# Patient Record
Sex: Male | Born: 1940 | Race: White | Hispanic: No | Marital: Married | Smoking: Former smoker
Health system: Southern US, Community
[De-identification: ages and names within clinical notes are randomized; demographics above are authoritative.]

## PROBLEM LIST (undated history)

## (undated) DIAGNOSIS — H3320 Serous retinal detachment, unspecified eye: Secondary | ICD-10-CM

## (undated) DIAGNOSIS — K219 Gastro-esophageal reflux disease without esophagitis: Secondary | ICD-10-CM

## (undated) DIAGNOSIS — C649 Malignant neoplasm of unspecified kidney, except renal pelvis: Secondary | ICD-10-CM

## (undated) DIAGNOSIS — J449 Chronic obstructive pulmonary disease, unspecified: Secondary | ICD-10-CM

## (undated) DIAGNOSIS — Z87442 Personal history of urinary calculi: Secondary | ICD-10-CM

## (undated) DIAGNOSIS — R52 Pain, unspecified: Secondary | ICD-10-CM

## (undated) DIAGNOSIS — K635 Polyp of colon: Secondary | ICD-10-CM

## (undated) DIAGNOSIS — N19 Unspecified kidney failure: Secondary | ICD-10-CM

## (undated) DIAGNOSIS — M6250 Muscle wasting and atrophy, not elsewhere classified, unspecified site: Secondary | ICD-10-CM

## (undated) DIAGNOSIS — K5900 Constipation, unspecified: Secondary | ICD-10-CM

## (undated) DIAGNOSIS — D649 Anemia, unspecified: Secondary | ICD-10-CM

## (undated) DIAGNOSIS — M1990 Unspecified osteoarthritis, unspecified site: Secondary | ICD-10-CM

## (undated) DIAGNOSIS — N200 Calculus of kidney: Secondary | ICD-10-CM

## (undated) HISTORY — PX: EYE SURGERY: SHX253

## (undated) HISTORY — PX: HAND SURGERY: SHX662

## (undated) HISTORY — PX: BACK SURGERY: SHX140

## (undated) HISTORY — PX: LAMINECTOMY: SHX219

## (undated) HISTORY — DX: Calculus of kidney: N20.0

## (undated) HISTORY — PX: NEPHRECTOMY: SHX65

## (undated) HISTORY — DX: Unspecified kidney failure: N19

## (undated) HISTORY — DX: Gastro-esophageal reflux disease without esophagitis: K21.9

## (undated) HISTORY — DX: Polyp of colon: K63.5

## (undated) HISTORY — DX: Muscle wasting and atrophy, not elsewhere classified, unspecified site: M62.50

## (undated) HISTORY — PX: KNEE SURGERY: SHX244

## (undated) HISTORY — DX: Chronic obstructive pulmonary disease, unspecified: J44.9

## (undated) HISTORY — DX: Malignant neoplasm of unspecified kidney, except renal pelvis: C64.9

---

## 1999-08-25 ENCOUNTER — Ambulatory Visit (HOSPITAL_COMMUNITY): Admission: RE | Admit: 1999-08-25 | Discharge: 1999-08-25 | Payer: Self-pay | Admitting: Gastroenterology

## 2000-10-05 ENCOUNTER — Ambulatory Visit (HOSPITAL_COMMUNITY): Admission: RE | Admit: 2000-10-05 | Discharge: 2000-10-05 | Payer: Self-pay | Admitting: *Deleted

## 2000-10-05 ENCOUNTER — Encounter: Payer: Self-pay | Admitting: *Deleted

## 2001-06-08 HISTORY — PX: CATARACT EXTRACTION: SUR2

## 2001-11-22 ENCOUNTER — Encounter: Payer: Self-pay | Admitting: Urology

## 2001-11-22 ENCOUNTER — Encounter: Admission: RE | Admit: 2001-11-22 | Discharge: 2001-11-22 | Payer: Self-pay | Admitting: Urology

## 2002-12-18 ENCOUNTER — Ambulatory Visit (HOSPITAL_COMMUNITY): Admission: RE | Admit: 2002-12-18 | Discharge: 2002-12-18 | Payer: Self-pay | Admitting: Urology

## 2002-12-18 ENCOUNTER — Encounter: Payer: Self-pay | Admitting: Urology

## 2003-10-16 ENCOUNTER — Emergency Department (HOSPITAL_COMMUNITY): Admission: EM | Admit: 2003-10-16 | Discharge: 2003-10-16 | Payer: Self-pay | Admitting: Emergency Medicine

## 2003-10-18 ENCOUNTER — Encounter: Payer: Self-pay | Admitting: Orthopedic Surgery

## 2004-10-27 ENCOUNTER — Ambulatory Visit: Payer: Self-pay | Admitting: Orthopedic Surgery

## 2004-11-10 ENCOUNTER — Ambulatory Visit: Payer: Self-pay | Admitting: Orthopedic Surgery

## 2004-11-12 ENCOUNTER — Ambulatory Visit (HOSPITAL_COMMUNITY): Admission: RE | Admit: 2004-11-12 | Discharge: 2004-11-12 | Payer: Self-pay | Admitting: Orthopedic Surgery

## 2004-11-19 ENCOUNTER — Ambulatory Visit: Payer: Self-pay | Admitting: Orthopedic Surgery

## 2004-12-22 ENCOUNTER — Encounter
Admission: RE | Admit: 2004-12-22 | Discharge: 2004-12-22 | Payer: Self-pay | Admitting: Physical Medicine and Rehabilitation

## 2005-01-05 ENCOUNTER — Encounter (INDEPENDENT_AMBULATORY_CARE_PROVIDER_SITE_OTHER): Payer: Self-pay | Admitting: *Deleted

## 2005-01-05 ENCOUNTER — Inpatient Hospital Stay (HOSPITAL_COMMUNITY): Admission: RE | Admit: 2005-01-05 | Discharge: 2005-01-08 | Payer: Self-pay | Admitting: Orthopedic Surgery

## 2005-03-02 ENCOUNTER — Ambulatory Visit (HOSPITAL_COMMUNITY): Admission: RE | Admit: 2005-03-02 | Discharge: 2005-03-02 | Payer: Self-pay | Admitting: Urology

## 2006-03-15 ENCOUNTER — Inpatient Hospital Stay (HOSPITAL_COMMUNITY): Admission: RE | Admit: 2006-03-15 | Discharge: 2006-03-22 | Payer: Self-pay | Admitting: Orthopedic Surgery

## 2006-04-07 ENCOUNTER — Emergency Department (HOSPITAL_COMMUNITY): Admission: EM | Admit: 2006-04-07 | Discharge: 2006-04-07 | Payer: Self-pay | Admitting: Emergency Medicine

## 2006-05-11 ENCOUNTER — Ambulatory Visit (HOSPITAL_COMMUNITY): Admission: RE | Admit: 2006-05-11 | Discharge: 2006-05-11 | Payer: Self-pay | Admitting: Urology

## 2007-02-08 ENCOUNTER — Encounter: Payer: Self-pay | Admitting: Critical Care Medicine

## 2007-02-08 ENCOUNTER — Ambulatory Visit (HOSPITAL_COMMUNITY): Admission: RE | Admit: 2007-02-08 | Discharge: 2007-02-08 | Payer: Self-pay | Admitting: Family Medicine

## 2007-12-05 DIAGNOSIS — Z860100 Personal history of colon polyps, unspecified: Secondary | ICD-10-CM | POA: Insufficient documentation

## 2007-12-05 DIAGNOSIS — Z87442 Personal history of urinary calculi: Secondary | ICD-10-CM | POA: Insufficient documentation

## 2007-12-06 ENCOUNTER — Ambulatory Visit: Payer: Self-pay | Admitting: Critical Care Medicine

## 2007-12-06 DIAGNOSIS — J449 Chronic obstructive pulmonary disease, unspecified: Secondary | ICD-10-CM | POA: Insufficient documentation

## 2007-12-06 DIAGNOSIS — J4489 Other specified chronic obstructive pulmonary disease: Secondary | ICD-10-CM | POA: Insufficient documentation

## 2007-12-06 DIAGNOSIS — C649 Malignant neoplasm of unspecified kidney, except renal pelvis: Secondary | ICD-10-CM | POA: Insufficient documentation

## 2007-12-07 ENCOUNTER — Telehealth (INDEPENDENT_AMBULATORY_CARE_PROVIDER_SITE_OTHER): Payer: Self-pay | Admitting: *Deleted

## 2007-12-19 ENCOUNTER — Telehealth: Payer: Self-pay | Admitting: Critical Care Medicine

## 2008-01-30 ENCOUNTER — Ambulatory Visit: Payer: Self-pay | Admitting: Critical Care Medicine

## 2008-03-30 ENCOUNTER — Ambulatory Visit: Payer: Self-pay | Admitting: Critical Care Medicine

## 2008-07-16 ENCOUNTER — Ambulatory Visit (HOSPITAL_COMMUNITY): Admission: RE | Admit: 2008-07-16 | Discharge: 2008-07-16 | Payer: Self-pay | Admitting: Urology

## 2008-10-30 ENCOUNTER — Emergency Department (HOSPITAL_COMMUNITY): Admission: EM | Admit: 2008-10-30 | Discharge: 2008-10-31 | Payer: Self-pay | Admitting: Emergency Medicine

## 2008-10-30 ENCOUNTER — Ambulatory Visit (HOSPITAL_BASED_OUTPATIENT_CLINIC_OR_DEPARTMENT_OTHER): Admission: RE | Admit: 2008-10-30 | Discharge: 2008-10-30 | Payer: Self-pay | Admitting: Orthopedic Surgery

## 2008-10-31 ENCOUNTER — Encounter (INDEPENDENT_AMBULATORY_CARE_PROVIDER_SITE_OTHER): Payer: Self-pay | Admitting: Emergency Medicine

## 2008-10-31 ENCOUNTER — Ambulatory Visit (HOSPITAL_COMMUNITY): Admission: RE | Admit: 2008-10-31 | Discharge: 2008-10-31 | Payer: Self-pay | Admitting: Emergency Medicine

## 2008-10-31 ENCOUNTER — Ambulatory Visit: Payer: Self-pay | Admitting: Vascular Surgery

## 2008-12-14 ENCOUNTER — Encounter: Payer: Self-pay | Admitting: Critical Care Medicine

## 2008-12-19 ENCOUNTER — Ambulatory Visit: Payer: Self-pay | Admitting: Internal Medicine

## 2009-01-21 ENCOUNTER — Ambulatory Visit: Payer: Self-pay | Admitting: Critical Care Medicine

## 2009-02-04 ENCOUNTER — Encounter: Payer: Self-pay | Admitting: Critical Care Medicine

## 2009-02-04 ENCOUNTER — Ambulatory Visit: Payer: Self-pay | Admitting: Critical Care Medicine

## 2009-02-13 ENCOUNTER — Telehealth (INDEPENDENT_AMBULATORY_CARE_PROVIDER_SITE_OTHER): Payer: Self-pay | Admitting: *Deleted

## 2009-05-29 ENCOUNTER — Ambulatory Visit: Payer: Self-pay | Admitting: Critical Care Medicine

## 2009-07-31 ENCOUNTER — Ambulatory Visit: Payer: Self-pay | Admitting: Orthopedic Surgery

## 2009-07-31 DIAGNOSIS — IMO0002 Reserved for concepts with insufficient information to code with codable children: Secondary | ICD-10-CM | POA: Insufficient documentation

## 2009-08-12 ENCOUNTER — Ambulatory Visit: Payer: Self-pay | Admitting: Orthopedic Surgery

## 2009-08-14 ENCOUNTER — Encounter: Payer: Self-pay | Admitting: Critical Care Medicine

## 2009-08-20 ENCOUNTER — Telehealth: Payer: Self-pay | Admitting: Critical Care Medicine

## 2009-08-21 ENCOUNTER — Ambulatory Visit: Payer: Self-pay | Admitting: Critical Care Medicine

## 2009-08-21 DIAGNOSIS — R599 Enlarged lymph nodes, unspecified: Secondary | ICD-10-CM | POA: Insufficient documentation

## 2009-08-23 ENCOUNTER — Ambulatory Visit (HOSPITAL_COMMUNITY): Admission: RE | Admit: 2009-08-23 | Discharge: 2009-08-23 | Payer: Self-pay | Admitting: Critical Care Medicine

## 2009-08-26 ENCOUNTER — Telehealth (INDEPENDENT_AMBULATORY_CARE_PROVIDER_SITE_OTHER): Payer: Self-pay | Admitting: *Deleted

## 2009-08-26 ENCOUNTER — Ambulatory Visit: Payer: Self-pay | Admitting: Orthopedic Surgery

## 2009-08-30 ENCOUNTER — Ambulatory Visit: Payer: Self-pay | Admitting: Emergency Medicine

## 2009-08-30 ENCOUNTER — Ambulatory Visit: Payer: Self-pay | Admitting: Thoracic Surgery

## 2009-08-30 ENCOUNTER — Ambulatory Visit (HOSPITAL_COMMUNITY): Admission: RE | Admit: 2009-08-30 | Discharge: 2009-08-30 | Payer: Self-pay | Admitting: Thoracic Surgery

## 2009-09-02 ENCOUNTER — Telehealth: Payer: Self-pay | Admitting: Critical Care Medicine

## 2009-09-03 ENCOUNTER — Telehealth: Payer: Self-pay | Admitting: Critical Care Medicine

## 2009-09-10 ENCOUNTER — Ambulatory Visit: Payer: Self-pay | Admitting: Critical Care Medicine

## 2009-10-21 ENCOUNTER — Ambulatory Visit: Payer: Self-pay | Admitting: Internal Medicine

## 2009-10-25 ENCOUNTER — Ambulatory Visit (HOSPITAL_BASED_OUTPATIENT_CLINIC_OR_DEPARTMENT_OTHER): Admission: RE | Admit: 2009-10-25 | Discharge: 2009-10-25 | Payer: Self-pay | Admitting: Urology

## 2010-06-17 ENCOUNTER — Encounter
Admission: RE | Admit: 2010-06-17 | Discharge: 2010-06-17 | Payer: Self-pay | Source: Home / Self Care | Attending: Cardiovascular Disease | Admitting: Cardiovascular Disease

## 2010-06-23 ENCOUNTER — Ambulatory Visit (HOSPITAL_COMMUNITY)
Admission: RE | Admit: 2010-06-23 | Discharge: 2010-06-23 | Payer: Self-pay | Source: Home / Self Care | Attending: Cardiovascular Disease | Admitting: Cardiovascular Disease

## 2010-06-25 LAB — POCT I-STAT 3, ART BLOOD GAS (G3+)
Acid-base deficit: 2 mmol/L (ref 0.0–2.0)
Bicarbonate: 20.6 mEq/L (ref 20.0–24.0)
O2 Saturation: 93 %
TCO2: 22 mmol/L (ref 0–100)
pCO2 arterial: 29.8 mmHg — ABNORMAL LOW (ref 35.0–45.0)
pH, Arterial: 7.448 (ref 7.350–7.450)
pO2, Arterial: 64 mmHg — ABNORMAL LOW (ref 80.0–100.0)

## 2010-06-25 LAB — POCT I-STAT 3, VENOUS BLOOD GAS (G3P V)
Acid-base deficit: 3 mmol/L — ABNORMAL HIGH (ref 0.0–2.0)
Acid-base deficit: 6 mmol/L — ABNORMAL HIGH (ref 0.0–2.0)
Bicarbonate: 19 mEq/L — ABNORMAL LOW (ref 20.0–24.0)
Bicarbonate: 21.6 mEq/L (ref 20.0–24.0)
O2 Saturation: 59 %
O2 Saturation: 62 %
TCO2: 20 mmol/L (ref 0–100)
TCO2: 23 mmol/L (ref 0–100)
pCO2, Ven: 34.5 mmHg — ABNORMAL LOW (ref 45.0–50.0)
pCO2, Ven: 34.8 mmHg — ABNORMAL LOW (ref 45.0–50.0)
pH, Ven: 7.345 — ABNORMAL HIGH (ref 7.250–7.300)
pH, Ven: 7.405 — ABNORMAL HIGH (ref 7.250–7.300)
pO2, Ven: 30 mmHg (ref 30.0–45.0)
pO2, Ven: 34 mmHg (ref 30.0–45.0)

## 2010-07-03 NOTE — Procedures (Addendum)
NAMEAMANI, NODARSE                ACCOUNT NO.:  [id number]  MEDICAL RECORD NO.:  [id number]          PATIENT TYPE:  OIB  LOCATION:  2899                         FACILITY:  [facility]  PHYSICIAN:  Thurmon Fair, MD     DATE OF BIRTH:  May 19, 1941  DATE OF PROCEDURE:  06/23/2010 DATE OF DISCHARGE:  06/23/2010                           CARDIAC CATHETERIZATION   Mr. Popoff is a 70 year old gentleman with chest discomfort.  A recent myocardial perfusion study suggested the presence of the inferior wall infarction documented by the absence of perfusion and severe hypokinesis.  He also has a longstanding history of smoking, albeit quitting 20 years ago.  PROCEDURES PERFORMED: 1. Right and left heart catheterization. 2. Selective coronary angiography. 3. Left ventriculography. 4. Calculation of cardiac output using the Fick equation.  SURGEON:  Thurmon Fair, MD  After risks and benefits of the procedure were described, the patient provided informed consent and was brought to the Cardiac Cath Lab in the fasting state.  He was prepped and draped in the usual sterile fashion. Local anesthesia with 1% lidocaine was administered into the right groin area.  A 5-French right common femoral artery sheath and a 7-French right common femoral vein sheath were introduced via the modified Seldinger technique without difficulty.  Under fluoroscopic guidance, a balloon-tip Swan-Ganz catheter was advanced to the level of the main pulmonary artery.  Simultaneous blood samples for oxygen saturation were obtained from the pulmonary artery and the femoral artery.  This catheter was then used to record pressures in the pulmonary artery wedge position, main pulmonary right ventricle, and right atrium and a final sample of blood from the right atrium was also sent for oximetry.  The Swan-Ganz catheter was then removed.  Under fluoroscopic guidance, using 5-French JL-4, JR, and angled pigtail catheters  with selective cannulation and multiple angiograms of the right and left coronary arteries were performed and the left ventriculogram in the RAO projection was also performed.  At the end of procedure, all catheters were withdrawn and no immediate complications were occurred.  FINDINGS:  Intracardiac pressures were recorded as follows:  Pulmonary artery 32/11 (mean 20) mmHg.  Pulmonary wedge pressure:  A-wave 10, V-wave 7 (mean 6 mmHg).  Right atrial pressure:  A-wave 10, V-wave 6 (mean 5) mmHg.  Right ventricle 29/4 with end-diastolic of 8 mmHg.  Left ventricle 141/0 with an end-diastolic of 16 mmHg.  Aorta 144 71 (mean 97) mmHg.  The calculated cardiac output using the Fick equation was 3.84 liters per minute corresponding to cardiac index of 2.45 liters per minute per meter squared body surface area.  The patient has a right coronary dominant system.  The right coronary artery generates a PDA and PLV.  The left coronary bifurcates in the usual fashion into the left anterior descending artery and left circumflex coronary artery.  The LAD generates 2 important diagonal branches.  The left circumflex generates 2 oblique marginal vessels of which the first is much larger and branches.  There is no evidence of any coronary stenosis in any of these vessels. In fact, there is no significant atherosclerosis noted in any of  these arteries.  Left ventricle is normal sized with regional wall motion and overall systolic function with ejection fraction of 60%.  There is no evidence of hypokinesis of any of the walls, specifically the inferior wall appears normal.  CONCLUSION:  Mr. Mancha has no evidence of significant coronary artery disease.  There is no evidence of previous inferior wall myocardial infarction as had been suggested by his nuclear stress test. Intracardiac pressures are normal.  The nuclear stress test is therefore consistent with a "false  positive" study.     Thurmon Fair, MD     MC/MEDQ  D:  06/23/2010  T:  06/24/2010  Job:  [id number]  Electronically Signed by Thurmon Fair M.D. on 07/03/2010 12:18:18 PM

## 2010-07-10 NOTE — Progress Notes (Signed)
Summary: Needs ROV appt  Phone Note Outgoing Call   Reason for Call: Confirm/change Appt Summary of Call: Pls call pt and get an ov scheduled with me next week to review results of ebus Initial call taken by: Storm Frisk MD,  September 03, 2009 12:23 PM  Follow-up for Phone Call        called and spoke with pt's wife.  wife scheduled pt a f/u appt with PW for Tues 09-10-2009 at 1:30pm and will relay message to pt re appt date and time.  Aundra Millet Reynolds LPN  September 03, 2009 1:04 PM

## 2010-07-10 NOTE — Assessment & Plan Note (Signed)
Summary: RE-CK LT SM FINGER/MEDICARE/CAF   Visit Type:  Follow-up Referring Provider:  Dr. [name] Primary Provider:  [name]  CC:  left small finger pain.  History of Present Illness: 70 years old injured LEFT small finger several years ago had reattachment of the digit but he was LEFT with a flexion contracture at the PIP joint.  He then fractured the fifth digit at the proximal phalanx and was treat with a splint for 2 weeks and then allowed to range the joint as tolerated.  Presents now with continued swelling in the same digit    Allergies: 1)  ! Lipitor   Impression & Recommendations:  Problem # 1:  CLOS FRACTURE MID/PROXIMAL PHALANX/PHALANG HAND (ICD-816.01) Assessment Comment Only  the swelling is most likely due to continued healing at the fracture site as it is a fibrous union  X-rays 3 views of the hand ordered  There is a comminuted fracture intra-articular at the PIP joint involving the proximal phalanx  There's been no change in position of the fracture  Impression healing fracture proximal phalanx.  Orders: Post-Op Check (16109) Hand x-ray, minimum 3 views (73130)  Patient Instructions: 1)  The fracture is still undergoing some healing  2)  but otherwise the xrays look fine  3)  Please schedule a follow-up appointment as needed.

## 2010-07-10 NOTE — Letter (Signed)
Summary: Historic Patient File  Historic Patient File   Imported By: Elvera Maria 08/02/2009 10:23:27  _____________________________________________________________________  External Attachment:    Type:   Image     Comment:   history form

## 2010-07-10 NOTE — Assessment & Plan Note (Signed)
Summary: Pulmonary OV   Copy to:  Dr. [name] Primary Provider/Referring Provider:  [name]  CC:  Follow up to discuss ebus results.  No complaints.  .  History of Present Illness: [name] with COPD /AB   golds stage I on PFTs 8/10:  FeV1 77%  Fev1/FVC 62%   May 29, 2009 11:41 AM F/U  copd/ab. Pt with minimal mucous,  much improved from the past summer.   No chest pain.  No wheeze.  Pt denies any significant sore throat, nasal congestion or excess secretions, fever, chills, sweats, unintended weight loss, pleurtic or exertional chest pain, orthopnea PND, or leg swelling Pt denies any increase in rescue therapy over baseline, denies waking up needing it or having any early am or nocturnal exacerbations of coughing/wheezing/or dyspnea. There are not any alleviating or precipitating factors noted.  The symptoms do not generally fluctuate.  August 21, 2009 9:08 AM this pt is referred for abnormal CT chest. This was obtained to evaluate interval changes as it related to renal cell CA to eval for poss recurrence.  The CT revealed hilar and mediastinal LAN. The pt is followed here for Copd Gold Stage I There more cough. Pt denies any significant sore throat, nasal congestion or excess secretions, fever, chills, sweats, unintended weight loss, pleurtic or exertional chest pain, orthopnea PND, or leg swelling Pt denies any increase in rescue therapy over baseline, denies waking up needing it or having any early am or nocturnal exacerbations of coughing/wheezing/or dyspnea.  September 10, 2009 1:39 PM the patient underwent an EBUS  procedure for mediastinal adenopathy.  The results revealed no malignancy.  Three lymph nodes were sampled.for The patient has a prior history of renal cell carcinoma with resection.  Patient returns for follow-up.  The patient denies any current complaints there is no dyspnea, cough, or chest pain.  Pt denies any significant sore throat, nasal congestion or  excess secretions, fever, chills, sweats, unintended weight loss, pleurtic or exertional chest pain, orthopnea PND, or leg swelling Pt denies any increase in rescue therapy over baseline, denies waking up needing it or having any early am or nocturnal exacerbations of coughing/wheezing/or dyspnea.   Preventive Screening-Counseling & Management  Alcohol-Tobacco     Smoking Status: quit > 6 months  Current Medications (verified): 1)  Allopurinol 300 Mg  Tabs (Allopurinol) .[name].. 1 By Mouth Daily 2)  Omeprazole 20 Mg  Tbec (Omeprazole) .[name].. 1 By Mouth Daily 3)  Adult Aspirin Ec Low Strength 81 Mg  Tbec (Aspirin) .[name].. 1 By Mouth Daily 4)  Multivitamins   Tabs (Multiple Vitamin) .[name].. 1 By Mouth Daily 5)  Viagra 100 Mg Tabs (Sildenafil Citrate) .... As Needed 6)  Mucinex 600 Mg Xr12h-Tab (Guaifenesin) .[name].. 1 By Mouth Daily 7)  Advair Diskus 250-50 Mcg/dose Aepb (Fluticasone-Salmeterol) .[name].. 1 Puff Two Times A Day 8)  Lexapro 10 Mg Tabs (Escitalopram Oxalate) .... Take 1 Tablet By Mouth Once Every Other Day  Allergies (verified): 1)  ! Lipitor  Past History:  Past medical, surgical, family and social histories (including risk factors) reviewed, and no changes noted (except as noted below).  Past Medical History: Reviewed history from 07/31/2009 and no changes required. 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 Left hand/wrist surgery Right knee  surgery Cervical laminectomy Cyst removed from right ear kidney removed  Past Pulmonary History:  Pulmonary History: Pulmonary Function Test  Date: 02/04/2009 Height (in.): 73 Gender: Male  Pre-Spirometry  FVC     Value: 4.02 L/min   Pred: 4.84 L/min     % Pred: 83 % FEV1      Value: 2.49 L     Pred: 3.25 L     % Pred: 77 % FEV1/FVC   Value: 62 %     Pred: 68 %     FEF 25-75   Value: 1.16 L/min   Pred: 2.84 L/min     % Pred: 41 %  Post-Spirometry  FVC     Value: 4.36 L/min   Pred: 4.84 L/min     % Pred: 90 % FEV1     Value: 2.49 L     Pred: 3.25 L     % Pred: 77 % FEV1/FVC   Value: 57 %     Pred: 62 %     FEF 25-75   Value: 0.90 L/min   Pred: 2.84 L/min     % Pred: 32 %  Lung Volumes  TLC     Value: 6.98 L   % Pred: 96 % RV     Value: 2.96 L   % Pred: 108 % DLCO     Value: 13.3 %   % Pred: 53 % DLCO/VA   Value: 2.01 %   % Pred: 56 %  Comments:  Moderate peripheral airflow obstruction.  Moderate reduction in DLCO.  Normal lung volumes.  Family History: Reviewed history from 07/31/2009 and no changes required. Family History Diabetes: mother Father deceased d/t complications from gallbladder surgery. Family History COPD  brother and sister Family History of Diabetes  Social History: Reviewed history from 07/31/2009 and no changes required. 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  no alcohol no caffeine use  Review of Systems  The patient denies shortness of breath with activity, shortness of breath at rest, productive cough, non-productive cough, coughing up blood, chest pain, irregular heartbeats, acid heartburn, indigestion, loss of appetite, weight change, abdominal pain, difficulty swallowing, sore throat, tooth/dental problems, headaches, nasal congestion/difficulty breathing through nose, sneezing, itching, ear ache, anxiety, depression, hand/feet swelling, joint stiffness or pain, rash, change in color of mucus, and fever.    Vital Signs:  Patient profile:   70 year old male Height:      72 inches Weight:      223.13 pounds BMI:     30.37 O2 Sat:      93 % on Room air Temp:     98.1 degrees F oral Pulse rate:   64 / minute BP sitting:   132 / 74  (left arm) Cuff size:   regular  Vitals Entered By: Gweneth Dimitri RN  (September 10, 2009 1:36 PM)  O2 Flow:  Room air CC: Follow up to discuss ebus results.  No complaints.   Comments Medications reviewed with patient Daytime contact number verified with patient. Crystal Jones RN  September 10, 2009 1:36 PM    Physical Exam  Additional Exam:  Gen: WD WN     WM     in  NAD    NCAT Heent:  no jvd, no TMG, no cervical LNademopathy, orophyx clear,  nares with clear watery drainage. Cor: RRR nl s1/s2  no s3/s4  no m r h g Abd: soft NT BSA   no masses  No HSM  no rebound or guarding Ext perfused with no c v e v.d Neuro: intact, moves all 4s, CN II-XII intact, DTRs intact Chest: clear, no rales, wheeze or rhonchi  Skin: clear  Genital/Rectal :deferred    Impression & Recommendations:  Problem # 1:  ENLARGEMENT OF LYMPH NODES (ICD-785.6) Assessment Unchanged history of mediastinal and hilar lymphadenopathy, etiology unclear,  Neg EBUS exam plan Will  OBTAIN CT scan in 6 weeks for close follow-up.  If adenopathy, unchanged or worsening will then consider mediastinoscopy Orders: Est. Patient Level III (13086) Radiology Referral (Radiology)  Problem # 2:  COPD (ICD-496) Assessment: Unchanged  Stable Copd , Golds stage I on PFTs  plan cont  advair to 250/50 one puff two times a day  Complete Medication List: 1)  Allopurinol 300 Mg Tabs (Allopurinol) .[name].. 1 by mouth daily 2)  Omeprazole 20 Mg Tbec (Omeprazole) .[name].. 1 by mouth daily 3)  Adult Aspirin Ec Low Strength 81 Mg Tbec (Aspirin) .[name].. 1 by mouth daily 4)  Multivitamins Tabs (Multiple vitamin) .[name].. 1 by mouth daily 5)  Viagra 100 Mg Tabs (Sildenafil citrate) .... As needed 6)  Mucinex 600 Mg Xr12h-tab (Guaifenesin) .[name].. 1 by mouth daily 7)  Advair Diskus 250-50 Mcg/dose Aepb (Fluticasone-salmeterol) .[name].. 1 puff two times a day 8)  Lexapro 10 Mg Tabs (Escitalopram oxalate) .... Take 1 tablet by mouth once every other day  Patient Instructions: 1)  A CT Chest will be obtained 3rd week of May  2011 2)  No change in medications    Appended Document: Pulmonary OV fax [name],  [facility]

## 2010-07-10 NOTE — Progress Notes (Signed)
Summary: results  Phone Note Call from Patient Call back at Home Phone [phone]   Caller: Spouse-Jerome Mays Call For: Jerome Mays Reason for Call: Talk to Nurse Summary of Call: calling for results of bronchoscopy. Initial call taken by: Eugene Gavia,  September 02, 2009 2:31 PM  Follow-up for Phone Call        pls advise   Philipp Deputy [facility]  September 02, 2009 3:52 PM   Additional Follow-up for Phone Call Additional follow up Details #1::        That will be available tomorrow I will call with results when available Crystal: pls check echart in am and put on my desktop results Additional Follow-up by: Storm Frisk MD,  September 02, 2009 4:16 PM    Additional Follow-up for Phone Call Additional follow up Details #2::    results printed from [facility] and placed on PW's desk.  Will forward message to PW  to inform him results are available.  Gweneth Dimitri RN  September 03, 2009 9:40 AM   Additional Follow-up for Phone Call Additional follow up Details #3:: Details for Additional Follow-up Action Taken: results  given Additional Follow-up by: Storm Frisk MD,  September 03, 2009 12:21 PM

## 2010-07-10 NOTE — Assessment & Plan Note (Signed)
Summary: RE-CK + XRAY LT SM FINGER/MEDICARE/CAF   Vital Signs:  Patient profile:   70 year old male Height:      73 inches Weight:      219 pounds BMI:     29.00 Pulse rate:   76 / minute Resp:     16 per minute CC: F/U Broken Left 5th Digit Pain Assessment Patient in pain? no        Visit Type:  Follow-up Referring Provider:  Dr. [name] Primary Provider:  [name]  CC:  F/U Broken Left 5th Digit.  History of Present Illness: f/u for lleft small finger fracture.  fracture care   Xray 07/31/09 Dr. Tenna Delaine office  the patient has a previous injury to the LEFT small finger with a near amputation and has lost sensation and movement at the PIP joint tear he now presents with a comminuted T. condylar fracture of the PIP joint.  Because he does not have sensation he does not complain of pain, he does have some swelling.  He has a loss of motion because he did not have any prior to injury    REPORT OF XRAY: FRACTURE LSF xrays today: 2 views left small finger:   there is a T condylar fracture in the joint of the prox phalanx   healing fracture LSF   Meds: Prilosec, Allopurinol, ASA, Lexapro, Mens multivitamin, Advair, Viagra.  splint has been removed activities as tolerated      Allergies: 1)  ! Lipitor   Impression & Recommendations:  Problem # 1:  CLOS FRACTURE MID/PROXIMAL PHALANX/PHALANG HAND (ICD-816.01) Assessment Improved  Orders: Post-Op Check (16109) Finger(s) x-ray, minimum 2 views (60454)  Patient Instructions: 1)  splint off  2)  activities as tolerated  3)  Please schedule a follow-up appointment as needed.

## 2010-07-10 NOTE — Progress Notes (Signed)
Summary: Abn CT chest   Phone Note Outgoing Call   Reason for Call: Discuss lab or test results Summary of Call: call this pt,  we need to see this pt asap for eval of his abn ct chest   Initial call taken by: Storm Frisk MD,  August 20, 2009 11:51 AM  Follow-up for Phone Call        called, spoke with pt.  Pt informed he needs to come in to discuss chest CT results asap per PW.  He verbalized understanding.  OV scheduled for tomorrow morning with PW - pt aware of time/date of  appt.   Follow-up by: Gweneth Dimitri RN,  August 20, 2009 3:12 PM

## 2010-07-10 NOTE — Progress Notes (Signed)
Summary: set up EBUS  Phone Note Outgoing Call   Summary of Call: Please schedule EBUS for this pt in OR -> call Forestine Na to get pt on schedule, [phone], and then let him know. I am free in the am all this week (office pm).  Thanks. Leslye Peer MD  August 26, 2009 8:39 AM   Follow-up for Phone Call        EBUS scheduled for Friday, 08/30/2009 @7 :15am. RB aware of date and time. LMOM for pt so he can be informed.Michel Bickers [facility]  August 26, 2009 9:42 AM  The patient is aware of date and time of EBUS and that the hospital will call him on Thurs with all the specifics.Michel Bickers CMA  August 26, 2009 10:24 AM

## 2010-07-10 NOTE — Assessment & Plan Note (Signed)
Summary: Pulmonary OV   Copy to:  Dr. [name] Primary Provider/Referring Provider:  [name]  CC:  Patient here to discuss abnormal ct chest results..  History of Present Illness: [name] with COPD /AB   golds stage I on PFTs 8/10:  FeV1 77%  Fev1/FVC 62%   May 29, 2009 11:41 AM F/U  copd/ab. Pt with minimal mucous,  much improved from the past summer.   No chest pain.  No wheeze.  Pt denies any significant sore throat, nasal congestion or excess secretions, fever, chills, sweats, unintended weight loss, pleurtic or exertional chest pain, orthopnea PND, or leg swelling Pt denies any increase in rescue therapy over baseline, denies waking up needing it or having any early am or nocturnal exacerbations of coughing/wheezing/or dyspnea. There are not any alleviating or precipitating factors noted.  The symptoms do not generally fluctuate.  August 21, 2009 9:08 AM this pt is referred for abnormal CT chest. This was obtained to evaluate interval changes as it related to renal cell CA to eval for poss recurrence.  The CT revealed hilar and mediastinal LAN. The pt is followed here for Copd Gold Stage I There more cough. Pt denies any significant sore throat, nasal congestion or excess secretions, fever, chills, sweats, unintended weight loss, pleurtic or exertional chest pain, orthopnea PND, or leg swelling Pt denies any increase in rescue therapy over baseline, denies waking up needing it or having any early am or nocturnal exacerbations of coughing/wheezing/or dyspnea.    Preventive Screening-Counseling & Management  Alcohol-Tobacco     Smoking Status: quit > 6 months  Current Medications (verified): 1)  Allopurinol 300 Mg  Tabs (Allopurinol) .[name].. 1 By Mouth Daily 2)  Omeprazole 20 Mg  Tbec (Omeprazole) .[name].. 1 By Mouth Daily 3)  Adult Aspirin Ec Low Strength 81 Mg  Tbec (Aspirin) .[name].. 1 By Mouth Daily 4)  Multivitamins   Tabs (Multiple Vitamin) .[name].. 1 By Mouth Daily 5)   Viagra 100 Mg Tabs (Sildenafil Citrate) .... As Needed 6)  Mucinex 600 Mg Xr12h-Tab (Guaifenesin) .[name].. 1 By Mouth Daily 7)  Advair Diskus 250-50 Mcg/dose Aepb (Fluticasone-Salmeterol) .[name].. 1 Puff Two Times A Day 8)  Lexapro 10 Mg Tabs (Escitalopram Oxalate) .... Take 1 Tablet By Mouth Once Every Other Day  Allergies (verified): 1)  ! Lipitor  Past History:  Past medical, surgical, family and social histories (including risk factors) reviewed, and no changes noted (except as noted below).  Past Medical History: Reviewed history from 07/31/2009 and no changes required. 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 Left hand/wrist surgery Right knee surgery Cervical laminectomy Cyst removed from right ear kidney removed  Past Pulmonary History:  Pulmonary History: Pulmonary Function Test  Date: 02/04/2009 Height (in.): 73 Gender: Male  Pre-Spirometry  FVC     Value: 4.02 L/min   Pred: 4.84 L/min     % Pred: 83 % FEV1     Value: 2.49 L     Pred: 3.25 L     % Pred: 77 % FEV1/FVC   Value: 62 %     Pred: 68 %     FEF 25-75   Value: 1.16 L/min   Pred: 2.84 L/min     % Pred: 41 %  Post-Spirometry  FVC  Value: 4.36 L/min   Pred: 4.84 L/min     % Pred: 90 % FEV1     Value: 2.49 L     Pred: 3.25 L     % Pred: 77 % FEV1/FVC   Value: 57 %     Pred: 62 %     FEF 25-75   Value: 0.90 L/min   Pred: 2.84 L/min     % Pred: 32 %  Lung Volumes  TLC     Value: 6.98 L   % Pred: 96 % RV     Value: 2.96 L   % Pred: 108 % DLCO     Value: 13.3 %   % Pred: 53 % DLCO/VA   Value: 2.01 %   % Pred: 56 %  Comments:  Moderate peripheral airflow obstruction.  Moderate reduction in DLCO.  Normal lung volumes.  Family History: Reviewed history  from 07/31/2009 and no changes required. Family History Diabetes: mother Father deceased d/t complications from gallbladder surgery. Family History COPD  brother and sister Family History of Diabetes  Social History: Reviewed history from 07/31/2009 and no changes required. 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  no alcohol no caffeine use  Review of Systems       The patient complains of shortness of breath with activity and non-productive cough.  The patient denies shortness of breath at rest, productive cough, coughing up blood, chest pain, irregular heartbeats, acid heartburn, indigestion, loss of appetite, weight change, abdominal pain, difficulty swallowing, sore throat, tooth/dental problems, headaches, nasal congestion/difficulty breathing through nose, sneezing, itching, ear ache, anxiety, depression, hand/feet swelling, joint stiffness or pain, rash, change in color of mucus, and fever.    Vital Signs:  Patient profile:   70 year old male Height:      73 inches (185.42 cm) Weight:      221.25 pounds (100.57 kg) BMI:     29.30 O2 Sat:      94 % on Room air Temp:     97.6 degrees F (36.44 degrees C) oral Pulse rate:   70 / minute BP sitting:   122 / 82  (left arm) Cuff size:   regular  Vitals Entered By: Michel Bickers CMA (August 21, 2009 8:54 AM)  O2 Sat at Rest %:  94 O2 Flow:  Room air CC: Patient here to discuss abnormal ct chest results. Comments Medications reviewed with the patient. Michel Bickers CMA  August 21, 2009 8:55 AM   Physical Exam  Additional Exam:  Gen: WD WN     WM     in  NAD    NCAT Heent:  no jvd, no TMG, no cervical LNademopathy, orophyx clear,  nares with clear watery drainage. Cor: RRR nl s1/s2  no s3/s4  no m r h g Abd: soft NT BSA   no masses  No HSM  no rebound or guarding Ext perfused with no c v e v.d Neuro: intact, moves all 4s, CN II-XII intact, DTRs intact Chest: clear, no rales, wheeze or rhonchi  Skin: clear    Genital/Rectal :deferred    Impression & Recommendations:  Problem # 1:  ENLARGEMENT OF LYMPH NODES (ICD-785.6) Assessment Deteriorated There is significant enlargement of hilar and mediastinal lymph nodes.  ? lymphoma  ?renal celll met  ? reactive ?other issue. This deserves further eval .  There are no pulmonary nodules or masses plan PET scan depending upon results will likely need EBUS with Dr Delton Coombes.  Orders: Est. Patient Level V (16109) Radiology Referral (Radiology)  Complete Medication List: 1)  Allopurinol 300 Mg Tabs (Allopurinol) .[name].. 1 by mouth daily 2)  Omeprazole 20 Mg Tbec (Omeprazole) .[name].. 1 by mouth daily 3)  Adult Aspirin Ec Low Strength 81 Mg Tbec (Aspirin) .[name].. 1 by mouth daily 4)  Multivitamins Tabs (Multiple vitamin) .[name].. 1 by mouth daily 5)  Viagra 100 Mg Tabs (Sildenafil citrate) .... As needed 6)  Mucinex 600 Mg Xr12h-tab (Guaifenesin) .[name].. 1 by mouth daily 7)  Advair Diskus 250-50 Mcg/dose Aepb (Fluticasone-salmeterol) .[name].. 1 puff two times a day 8)  Lexapro 10 Mg Tabs (Escitalopram oxalate) .... Take 1 tablet by mouth once every other day  Patient Instructions: 1)  A PET scan will be obtained. 2)  Based upon this likely will get  an EBUS procedure at [facility] with Dr Delton Coombes 3)  I will call you with PET scan result    Immunization History:  Pneumovax Immunization History:    Pneumovax:  historical (06/09/2007)  Appended Document: Pulmonary OV fax [facility];  [name]

## 2010-07-10 NOTE — Progress Notes (Signed)
Summary: Initial Eval  Initial Eval   Imported By: Cammie Sickle 07/31/2009 11:07:08  _____________________________________________________________________  External Attachment:    Type:   Image     Comment:   External Document

## 2010-07-10 NOTE — Assessment & Plan Note (Signed)
Summary: FX LT SM FINGER/XRAY URG CARE/REF GUARINO/BRING'G FILM/MEDICA...   Vital Signs:  Patient profile:   70 year old male Pulse rate:   78 / minute Resp:     16 per minute  Vitals Entered By: Fuller Canada MD (July 31, 2009 11:09 AM)  Visit Type:  Initial Consult Referring Provider:  Dr. [name] Primary Provider:  [name]  CC:  left small finger fracture.[name]  History of Present Illness: I saw Jerome Mays in the office today for an initial visit.  He is a 70 years old man with the complaint of:  left small finger fracture.  Xray 07/31/09 Dr. Tenna Delaine office  the patient has a previous injury to the LEFT small finger with a near amputation and has lost sensation and movement at the PIP joint tear he now presents with a comminuted T. condylar fracture of the PIP joint.  Because he does not have sensation he does not complain of pain, he does have some swelling.  He has a loss of motion because he did not have any period  Meds: Prilosec, Allopurinol, ASA, Lexapro, Mens multivitamin, Advair, Viagra.      Allergies: 1)  ! 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  no alcohol no caffeine use  Review of Systems General:  Denies weight loss, weight gain, fever, chills, and fatigue; easy bruising. Cardiac :  Denies chest pain, angina, heart attack,  heart failure, poor circulation, blood clots, and phlebitis. Resp:  Denies short of breath, difficulty breathing, COPD, cough, and pneumonia. GI:  Denies nausea, vomiting, diarrhea, constipation, difficulty swallowing, ulcers, GERD, and reflux. GU:  Denies kidney failure, kidney transplant, kidney stones, burning, poor stream, testicular cancer, blood in urine, and . Neuro:  Denies headache, dizziness, migraines, numbness, weakness, tremor, and unsteady walking. MS:  Denies joint pain, rheumatoid arthritis, joint swelling, gout, bone cancer, osteoporosis, and . Endo:  Denies thyroid disease, goiter, and diabetes. Psych:  Denies depression, mood swings, anxiety, panic attack, bipolar, and schizophrenia. Derm:  Denies eczema, cancer, and itching. EENT:  Denies poor vision, cataracts, glaucoma, poor hearing, vertigo, ears ringing, sinusitis, hoarseness, toothaches, and bleeding gums. Immunology:  Denies seasonal allergies, sinus problems, and allergic to bee stings. Lymphatic:  Denies lymph node cancer and lymph edema.  Physical Exam  Additional Exam:  VS reviewed are stable  GENERAL: Appearance is normal   CDV: normal pulse and temperature   Skin: was normal   Neuro: sensation was abnormal the digit although he did respond to deep palpation with pain  MSKLEFT small finger  There is some response to pain to deep palpation or swelling at the PIP joint there is no motion at the PIP joint of motor exam is therefore indicative of lack of full flexion and loss of full strength, no joint laxity    Impression & Recommendations:  Problem # 1:  CLOS FRACTURE MID/PROXIMAL PHALANX/PHALANG HAND (ICD-816.01) Assessment New  outside films from the referring physician's office show  T. condylar fracture alignment acceptable  Recommended splint in flexion until March 7 for x-rays in the splint off  Orders: New Patient Level II (13086) Phalanx Fx (57846)  Patient Instructions: 1)  f/u March 7  or 8 for xrays

## 2010-07-10 NOTE — Progress Notes (Signed)
Summary: Office Visits 11/19/04,11/10/04,10/27/04  Office Visits 11/19/04,11/10/04,10/27/04   Imported By: Cammie Sickle 07/31/2009 11:05:16  _____________________________________________________________________  External Attachment:    Type:   Image     Comment:   External Document

## 2010-07-14 ENCOUNTER — Encounter: Payer: Self-pay | Admitting: Critical Care Medicine

## 2010-07-14 ENCOUNTER — Ambulatory Visit (INDEPENDENT_AMBULATORY_CARE_PROVIDER_SITE_OTHER): Payer: Medicare Other | Admitting: Critical Care Medicine

## 2010-07-14 DIAGNOSIS — J449 Chronic obstructive pulmonary disease, unspecified: Secondary | ICD-10-CM

## 2010-07-14 DIAGNOSIS — J4489 Other specified chronic obstructive pulmonary disease: Secondary | ICD-10-CM

## 2010-07-24 NOTE — Assessment & Plan Note (Signed)
Summary: Pulmonary OV   Visit Type:  Follow-up Copy to:  Dr. [name] Primary Provider/Referring Provider:  [name]  CC:  COPD.  No breathing changes.  No complaints today.  Needs Advair refill.[name]  History of Present Illness: [name] with COPD /AB   golds stage I on PFTs 8/10:  FeV1 77%  Fev1/FVC 62%   May 29, 2009 11:41 AM F/U  copd/ab. Pt with minimal mucous,  much improved from the past summer.   No chest pain.  No wheeze.  Pt denies any significant sore throat, nasal congestion or excess secretions, fever, chills, sweats, unintended weight loss, pleurtic or exertional chest pain, orthopnea PND, or leg swelling Pt denies any increase in rescue therapy over baseline, denies waking up needing it or having any early am or nocturnal exacerbations of coughing/wheezing/or dyspnea. There are not any alleviating or precipitating factors noted.  The symptoms do not generally fluctuate.  August 21, 2009 9:08 AM this pt is referred for abnormal CT chest. This was obtained to evaluate interval changes as it related to renal cell CA to eval for poss recurrence.  The CT revealed hilar and mediastinal LAN. The pt is followed here for Copd Gold Stage I There more cough. Pt denies any significant sore throat, nasal congestion or excess secretions, fever, chills, sweats, unintended weight loss, pleurtic or exertional chest pain, orthopnea PND, or leg swelling Pt denies any increase in rescue therapy over baseline, denies waking up needing it or having any early am or nocturnal exacerbations of coughing/wheezing/or dyspnea.  September 10, 2009 1:39 PM the patient underwent an EBUS  procedure for mediastinal adenopathy.  The results revealed no malignancy.  Three lymph nodes were sampled.for The patient has a prior history of renal cell carcinoma with resection.  Patient returns for follow-up.  The patient denies any current complaints there is no dyspnea, cough, or chest pain.  Pt denies any  significant sore throat, nasal congestion or excess secretions, fever, chills, sweats, unintended weight loss, pleurtic or exertional chest pain, orthopnea PND, or leg swelling Pt denies any increase in rescue therapy over baseline, denies waking up needing it or having any early am or nocturnal exacerbations of coughing/wheezing/or dyspnea.   July 14, 2010 9:03 AM No real changes, but had a bad cold one week ago.  Now not coughing.  No real wheeze, but still is congested.  No pndrip. No f/c/s.  No cp.  Just had heart cath and was "ok". Pt denies any significant sore throat, nasal congestion or excess secretions, fever, chills, sweats, unintended weight loss, pleurtic or exertional chest pain, orthopnea PND, or leg swelling Pt denies any increase in rescue therapy over baseline, denies waking up needing it or having any early am or nocturnal exacerbations of coughing/wheezing/or dyspnea.    Preventive Screening-Counseling & Management  Alcohol-Tobacco     Alcohol drinks/day: 0     Smoking Status: quit > 6 months     Packs/Day: 2 pks a day     Year Started: 1965     Year Quit: 1995     Pack years: 2-2 1/2 ppd x 40 years  Current Medications (verified): 1)  Allopurinol 300 Mg  Tabs (Allopurinol) .[name].. 1 By Mouth Daily 2)  Omeprazole 20 Mg  Tbec (Omeprazole) .[name].. 1 By Mouth Daily 3)  Adult Aspirin Ec Low Strength 81 Mg  Tbec (Aspirin) .[name].. 1 By Mouth Daily 4)  Multivitamins   Tabs (Multiple Vitamin) .[name].. 1 By Mouth Daily 5)  Viagra 100 Mg Tabs (Sildenafil Citrate) .... As Needed 6)  Mucinex 600 Mg Xr12h-Tab (Guaifenesin) .[name].. 1 By Mouth Daily 7)  Advair Diskus 250-50 Mcg/dose Aepb (Fluticasone-Salmeterol) .[name].. 1 Puff Two Times A Day 8)  Meloxicam 15 Mg Tabs (Meloxicam) .... 2 By Mouth Daily As Needed 9)  Pravastatin Sodium 40 Mg Tabs (Pravastatin Sodium) .[name].. 1 By Mouth At Bedtime  Allergies (verified): 1)  ! Lipitor  Past History:  Past medical, surgical, family and social  histories (including risk factors) reviewed, and no changes noted (except as noted below).  Past Medical History: Reviewed history from 07/31/2009 and no changes required. 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 Left hand/wrist surgery Right knee surgery Cervical laminectomy Cyst removed from right ear kidney removed  Past Pulmonary History:  Pulmonary History: Pulmonary Function Test  Date: 02/04/2009 Height (in.): 73 Gender: Male  Pre-Spirometry  FVC     Value: 4.02 L/min   Pred: 4.84 L/min     % Pred: 83 % FEV1     Value: 2.49 L     Pred: 3.25 L     % Pred: 77 % FEV1/FVC   Value: 62 %     Pred: 68 %     FEF 25-75   Value: 1.16 L/min   Pred: 2.84 L/min     % Pred: 41 %  Post-Spirometry  FVC     Value: 4.36 L/min   Pred: 4.84 L/min     % Pred: 90 % FEV1     Value: 2.49 L     Pred: 3.25 L     % Pred: 77 % FEV1/FVC   Value: 57 %     Pred: 62 %     FEF 25-75   Value: 0.90 L/min   Pred: 2.84 L/min     % Pred: 32 %  Lung Volumes  TLC     Value: 6.98 L   % Pred: 96 % RV     Value: 2.96 L   % Pred: 108 % DLCO     Value: 13.3 %   % Pred: 53 % DLCO/VA   Value: 2.01 %   % Pred: 56 %  Comments:  Moderate peripheral airflow obstruction.  Moderate reduction in DLCO.  Normal lung volumes.  Family History: Reviewed history from 07/31/2009 and no changes required. Family History Diabetes: mother Father deceased d/t complications from gallbladder surgery. Family History COPD  brother and sister Family History of Diabetes  Social History: Reviewed history from 07/31/2009 and no changes required. 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  no alcohol no caffeine use  Review of Systems       The  patient complains of shortness of breath with activity, non-productive cough, acid heartburn, and indigestion.  The patient denies shortness of breath at rest, productive cough, coughing up blood, chest pain, irregular heartbeats, loss of appetite, weight change, abdominal pain, difficulty swallowing, sore throat, tooth/dental problems, headaches, nasal congestion/difficulty breathing through nose, sneezing, itching, ear ache, anxiety, depression, hand/feet swelling, joint stiffness or pain, rash, change in color of mucus, and fever.    Vital Signs:  Patient profile:   70 year old male Height:      72 inches (182.88 cm) Weight:  218 pounds (99.09 kg) BMI:     29.67 O2 Sat:      91 % on Room air Temp:     97.6 degrees F (36.44 degrees C) oral Pulse rate:   73 / minute BP sitting:   114 / 78  (left arm) Cuff size:   regular  Vitals Entered By: Michel Bickers CMA (July 14, 2010 8:55 AM)  O2 Sat at Rest %:  91 O2 Flow:  Room air  CC: COPD.  No breathing changes.  No complaints today.  Needs Advair refill. Comments Medications reviewed with patient Michel Bickers CMA  July 14, 2010 8:56 AM   Physical Exam  Additional Exam:  Gen: WD WN     WM     in  NAD    NCAT Heent:  no jvd, no TMG, no cervical LNademopathy, orophyx clear,  nares with clear watery drainage. Cor: RRR nl s1/s2  no s3/s4  no m r h g Abd: soft NT BSA   no masses  No HSM  no rebound or guarding Ext perfused with no c v e v.d Neuro: intact, moves all 4s, CN II-XII intact, DTRs intact Chest: clear, no rales, wheeze or rhonchi  Skin: clear  Genital/Rectal :deferred    CT of Chest  Procedure date:  10/21/2009  Findings:      IMPRESSION: Stable enlarged mediastinal and bilateral hilar lymph nodes, unchanged over 3 1/2 years and compatible with a benign or reactive process.   Emphysema.    Impression & Recommendations:  Problem # 1:  COPD (ICD-496) Assessment Unchanged  Stable Copd , Golds stage I on  PFTs  plan cont  advair to 250/50 one puff two times a day  Medications Added to Medication List This Visit: 1)  Meloxicam 15 Mg Tabs (Meloxicam) .... 2 by mouth daily as needed 2)  Pravastatin Sodium 40 Mg Tabs (Pravastatin sodium) .[name].. 1 by mouth at bedtime  Complete Medication List: 1)  Allopurinol 300 Mg Tabs (Allopurinol) .[name].. 1 by mouth daily 2)  Omeprazole 20 Mg Tbec (Omeprazole) .[name].. 1 by mouth daily 3)  Adult Aspirin Ec Low Strength 81 Mg Tbec (Aspirin) .[name].. 1 by mouth daily 4)  Multivitamins Tabs (Multiple vitamin) .[name].. 1 by mouth daily 5)  Viagra 100 Mg Tabs (Sildenafil citrate) .... As needed 6)  Mucinex 600 Mg Xr12h-tab (Guaifenesin) .[name].. 1 by mouth daily 7)  Advair Diskus 250-50 Mcg/dose Aepb (Fluticasone-salmeterol) .[name].. 1 puff two times a day 8)  Meloxicam 15 Mg Tabs (Meloxicam) .... 2 by mouth daily as needed 9)  Pravastatin Sodium 40 Mg Tabs (Pravastatin sodium) .[name].. 1 by mouth at bedtime  Other Orders: Est. Patient Level III (16109)  Patient Instructions: 1)  No change in medications 2)  Return in     6     months   Immunization History:  Influenza Immunization History:    Influenza:  historical (04/08/2010)  Prevention & Chronic Care Immunizations   Influenza vaccine: Historical  (04/08/2010)    Tetanus booster: Not documented    Pneumococcal vaccine: Historical  (06/09/2007)    H. zoster vaccine: Not documented  Colorectal Screening   Hemoccult: Not documented    Colonoscopy: Not documented  Other Screening   PSA: Not documented   Smoking status: quit > 6 months  (07/14/2010)  Lipids   Total Cholesterol: Not documented   LDL: Not documented   LDL Direct: Not documented   HDL: Not documented   Triglycerides: Not documented  Appended Document: Pulmonary OV fax [name]

## 2010-09-01 LAB — AFB CULTURE WITH SMEAR (NOT AT ARMC): Acid Fast Smear: NONE SEEN

## 2010-09-01 LAB — COMPREHENSIVE METABOLIC PANEL
ALT: 23 U/L (ref 0–53)
Albumin: 3.4 g/dL — ABNORMAL LOW (ref 3.5–5.2)
Calcium: 9.2 mg/dL (ref 8.4–10.5)
Chloride: 110 mEq/L (ref 96–112)
Creatinine, Ser: 1.24 mg/dL (ref 0.4–1.5)
Glucose, Bld: 151 mg/dL — ABNORMAL HIGH (ref 70–99)
Potassium: 4 mEq/L (ref 3.5–5.1)
Total Protein: 6.5 g/dL (ref 6.0–8.3)

## 2010-09-01 LAB — CULTURE, RESPIRATORY W GRAM STAIN

## 2010-09-01 LAB — CBC: RDW: 15.3 % (ref 11.5–15.5)

## 2010-09-01 LAB — APTT: aPTT: 32 seconds (ref 24–37)

## 2010-09-01 LAB — FUNGUS CULTURE W SMEAR: Fungal Smear: NONE SEEN

## 2010-09-01 LAB — PROTIME-INR: INR: 1.07 (ref 0.00–1.49)

## 2010-09-16 LAB — POCT I-STAT 4, (NA,K, GLUC, HGB,HCT)
Hemoglobin: 15.3 g/dL (ref 13.0–17.0)
Potassium: 4.1 mEq/L (ref 3.5–5.1)
Sodium: 142 mEq/L (ref 135–145)

## 2010-09-26 ENCOUNTER — Other Ambulatory Visit: Payer: Self-pay | Admitting: Critical Care Medicine

## 2010-10-21 NOTE — Op Note (Signed)
Jerome Mays, FOUT                ACCOUNT NO.:  [id number]   MEDICAL RECORD NO.:  [id number]          PATIENT TYPE:  AMB   LOCATION:  [facility]                         FACILITY:  [facility]   PHYSICIAN:  Marlowe Kays, M.D.  DATE OF BIRTH:  Nov 16, 1940   DATE OF PROCEDURE:  10/30/2008  DATE OF DISCHARGE:                               OPERATIVE REPORT   PREOPERATIVE DIAGNOSES:  1. Torn medial meniscus.  2. Medial shelf plica.  3. Osteoarthritis, left knee.   POSTOPERATIVE DIAGNOSES:  1. Torn medial meniscus.  2. Medial shelf plica.  3. Osteoarthritis, left knee.   OPERATION:  Left knee arthroscopy with:  1. Partial medial meniscectomy.  2. Debridement/shaving medial femoral condyle.  3. Shaving of patella.  4. Excision medial shelf plica.   SURGEON:  Dr. Simonne Come.   ASSISTANT:  Nurse.   ANESTHESIA:  General.   PATHOLOGY/JUSTIFICATION FOR PROCEDURE:  He has a knee replacement on the  right and was noted on the MRI to have particularly in the medial  compartment of the knee joint significant chondromalacia.  See operative  description below.   PROCEDURE:  Prophylactic antibiotics, Ace wrap and knee support to right  lower extremity, pneumatic tourniquet to left lower extremity, the leg  Esmarched out nonsterilely, and tourniquet inflated to 250 mmHg.  Thigh  stabilizer applied.  Leg was prepped with DuraPrep from stabilizer to  ankle and draped in sterile field.  Superior medial saline inflow, first  through an anteromedial portal, lateral compartment joint was evaluated.  He had a good bit of synovitis present which I resected the 3.5 shaver.  He had slight wear of both the lateral femoral condyle and lateral  tibial plateau.  Looking at the lateral gutter and suprapatellar area,  he had wear of the patella and the medial shelf plica.  I sectioned the  plica with scissors and then shaved out the remnants.  I also shaved the  patella with a 3.5 shaver with pre- and post-films  being taken.  I then  reversed portals.  There was a large amount of synovitis in the medial  joint.  He had full thickness wear over a substantial portion of the  medial tibial plateau and grade 3/4 wear of the medial femoral condyle.  I shaved down the medial femoral condyle with a combination of 3.5  shaver and rasp and then trimmed back the meniscus to stable rim with a  combination of baskets and a 3.5 shaver.  I then irrigated the knee  joint until clear and all fluid possible was removed.  The 2 anterior  portals were closed with 4-0 nylon.  I then injected 20 mL of 0.25%  Marcaine with adrenaline and 4 mg of morphine through the inflow  apparatus which I  removed and closed with 4-0 nylon as well and closed this portal with 4-  0 nylon as well.  Betadine, Adaptic and dry sterile dressing were  applied.  Tourniquet was released.  He tolerated the procedure well and  was taken to recovery in satisfactory condition with no known  complications.  ______________________________  Marlowe Kays, M.D.     JA/MEDQ  D:  10/30/2008  T:  10/30/2008  Job:  [id number]

## 2010-10-24 NOTE — Op Note (Signed)
Jerome Mays, Jerome Mays                ACCOUNT NO.:  [id number]   MEDICAL RECORD NO.:  [id number]          PATIENT TYPE:  INP   LOCATION:  1512                         [facility]ACILITY:  [facility]   PHYSICIAN:  Marlowe Kays, M.D.  DATE OF BIRTH:  [birth year]   DATE OF PROCEDURE:  03/15/2006  DATE OF DISCHARGE:                                 OPERATIVE REPORT   PREOPERATIVE DIAGNOSIS:  Severe osteoarthritis right knee.   POSTOP DIAGNOSIS:  Severe osteoarthritis right knee.   OPERATION:  Osteonics total knee replacement, right.   SURGEON:  Dr. Simonne Come.   ASSISTANT:  Mr. Adrian Blackwater.   ANESTHESIA:  General.   JUSTIFICATION FOR PROCEDURE:  He did have old prior surgery and at the  present time was having severe pain with a substantial flexion contracture,  medial shift of the femur on the tibia and bone-on-bone abutment medially.   PROCEDURE:  Prophylactic antibiotics and attempt was made for spinal  anesthetic and unsuccessful. General anesthetic employed.  Foley catheter  inserted. The pneumatic tourniquet applied as well as a Surefoot and lateral  hip stabilizer. Right leg was prepped with DuraPrep.  Ioban employed. Right  leg was prepped with DuraPrep from tourniquet to ankle and draped in sterile  field. Leg was Esmarch out non sterilely and 350 mmHg. Midline incision down  to the patellar mechanism had a very thick prepatellar bursitis and I  excised this scarred deformed tissue.  Then used median parapatellar  incision to open the joint. Two large osseous fragments from the  undersurface of the quadriceps tendon were removed and large osteophytes  were trimmed from around the patella.  I measured it at 28 mm and this was  patella size we then utilized.  I then freed up the pace anserinus and  medial collateral ligament from the proximal medial tibia and everted the  patella, flexed the knee, and removed additional osteophytes from around the  femur and the portion of the  anterolateral meniscus was removed. His knee  was quite tight in keeping with the medial joint line narrowing and medial  subluxation of the femur on the tibia.  I made an initial 5/16 inch drill  hole in distal femur and then used the axial aligner set for 5 degrees  distal femoral cut for the right knee. I elected to take 12 mm of bone off  the distal femur rather than the usual 10 because of the flexion contracture  and this cut was made.  There was not enough working room to place the  cradle apparatus for sizing the distal femur and accordingly, I made initial  leveling cut on the tibia which did allow me to get the cradle in place and  we sized to at roughly a 9 to 10 with 10 being the best fit.  Scribe lines  were placed and the distal femoral cutting jig for 9 was then placed and  anterior posterior cuts and posterior anterior chamfers were then made. With  return to the tibia where I was able to work my way around medially on the  medial side from  anterior to posterior freeing up the tightened tissues  there and then his knee was quite tight, however, still and I used a 7 tray  initially to make my initial intramedullary drill hole followed by the step-  cut drill canal finder and intramedullary rod set for a 4 mm cut off the  medial tibial plateau at 90 degrees. After this cut was made I then had  additional working room on the femur to remove remnants of the my initial  cut for the posterior condyles.  I then placed the guide for the patellar  groove and followed this by using micro saw to remove some bone from the  notchplasty and then the two instruments for creating the notchplasty.  I  then placed a laminar spreader and removed final remnants of bone and soft  tissue from behind femoral condyles and went through a trial reduction and  found at least 10 mm spacer would be sufficient so that no additional bone  needed to be removed from proximal tibia.  I also found that 9  perhaps 11  pounds tibial tray would be the appropriate size.  I used the external  aligning rod splitting bimalleolar distance and made scribe lines on the  anterior tibia.  Using extension I then used a 10-mm recessed cutting jig  for making 10 mm recess cut followed by a guide for the three fixation holes  and then placed the patellar component and trimmed up excess bone from  around the perimeter of the patella.  I then returned to the tibia where the  11 base plate was the best fit and was attached to the tibia with three  fixation pins and I then used tripod apparatus to ream up to 11 cemented,  then removed the base plate and we used hand reamers for the 80 mm tibial  extension which I elected to use because of the severe deformity reaming up  to a 16.  I then went through a trial of the tibial component and this fit  nicely.  Accordingly we went ahead and water picked the knee.  I assisted  the scrub nurse in putting together the tibial stem with the 80 mm x 16 mm  extension and then mixed methacrylate and individually glued in the three  components, starting first with the tibia and not gluing in the extension,  impacting it and removing excess methacrylate.  I did the same to the femur  and placed 10 mm spacer.  We held the leg in extension while I glued in the  patella holding patellar holding clamp.  The methacrylate hardened, I  removed the clamp and we removed small amounts of methacrylate from around  the knee, went through a second trial reduction, found the 12-mm spacer  seemed to be the same out of extension to neutral and good medial lateral  stability so we elected to go with it.  I irrigated the wound well with  sterile saline, placed final 12 mm spacer and checked motion and instability  which were excellent. No lateral release was required.  I then infiltrated  all soft tissues with half percent Marcaine with adrenalin and with now 2 hours of tourniquet time I  released the tourniquet.  We closed over Hemovac  with two layers of #1 Vicryl in the quadriceps tendon and distally in the  synovium and capsule, 2-0 Vicryl subcutaneous tissue, staples in the skin.  Betadine Adaptic dry sterile dressing and knee immobilizer were applied,  tolerated the procedure well and was taken to the recovery room in  satisfactory condition with no known complications.  Estimated blood loss  was perhaps 200 mL with no blood replacement.           ______________________________  Marlowe Kays, M.D.     JA/MEDQ  D:  03/15/2006  T:  03/17/2006  Job:  [id number]

## 2010-10-24 NOTE — Op Note (Signed)
Jerome Mays, Jerome Mays                ACCOUNT NO.:  [id number]   MEDICAL RECORD NO.:  [id number]          PATIENT TYPE:  INP   LOCATION:  1510                         FACILITY:  [facility]   PHYSICIAN:  Marlowe Kays, M.D.  DATE OF BIRTH:  1940-12-11   DATE OF PROCEDURE:  01/05/2005  DATE OF DISCHARGE:                                 OPERATIVE REPORT   PREOPERATIVE DIAGNOSES:  1.  Spinal stenosis L2-3, L3-4, L4-5.  2.  Herniated nucleus pulposus with large free fragment L4-5, left.   POSTOPERATIVE DIAGNOSES:  1.  Spinal stenosis L2-3, L3-4, L4-5.  2.  Herniated nucleus pulposus with large free fragment L4-5, left.   OPERATION:  1.  Central decompressive laminectomy L2-3, L3-4, L4-5.  2.  Microdiskectomy with removal of large free fragment disk at the L4-5,      left.   SURGEON:  Marlowe Kays, M.D.   ASSISTANT:  Georges Lynch. Darrelyn Hillock, M.D.   ANESTHESIA:  General.   JUSTIFICATION FOR THE PROCEDURE:  He has had that 2-1/2 months history of  progressive low back and primarily left leg pain.  While observed there was  noted some weakness in dorsiflexion of the left foot.  I did not notice it  on my evaluation of December 25, 2004.  He has had a workup which has included  MRI demonstrating disk bulges at several levels but the primary problem  being a large free fragment at L4-5 extending over the body of L5 and spinal  stenosis with foraminal stenosis at a number of levels.  This led to a  myelogram CT scan demonstrating severe spinal stenosis at L4-5 and lesser at  L3-L4 and lesser at L2-3.  Because of the combination of findings on the two  testing, he is here for the above mentioned surgery.  The surgical findings  coincided with the myelogram and MRI findings.   DESCRIPTION OF PROCEDURE:  Prophylactic antibiotics, satisfactory general  anesthesia, knee-chest position on the Hartley frame.  Back was prepped with  DuraPrep and draped in a sterile field.  Ioban employed.  Vertical  midline  incision with two spinous processes tagged with Kocher clamps and were  tagged and designated as L2 and L3.  I then extended the incision distalward  until we had developed what I thought was the spinous process of L4 and L5.  I then tagged the end of the Kocher clamps and took a second lateral x-ray  confirming that the clamps were on L4 and L5 with the disk space midway  between.  We then dissected soft tissue off the neural arches from L2-L5 and  placed two self-retaining McCullough retractors with double-action rongeur.  I removed the spinous process and most of the neural arch, taking a portion  of the L2 spinous process and a portion of L5 and everything in between.  We  then fine tuned the decompression with a combination of 2 mm and 3 mm  Kerrison rongeurs.  Once the gross decompression been completed, I used a  microscope to complete the decompression.  We worked proximally and distally  until the  spinal canal appeared to be well decompressed.  All the foramina,  particularly on the left side, where he was symptomatic was decompressed as  well.  We then identified the L5 nerve root and mobilized the dura and nerve  root medially.  Distal beneath the nerve root extending out the lateral  recess was a large amount of disk material some which was fairly organized.  The interspace itself was very narrow.  We opened it but could obtain very  little disk material.  This was in keeping with the myelogram MRI pictures.  The  major problem was the large amount of disk material.  We kept working  carefully with nerve hook and Penfield 4, removing fragment after fragment  until we thought that we had completed the decompression thoroughly.  At  this point, the L5 nerve root was lying well decompressed in the foramen and  beneath it I could place a Hockey stick without any trouble.  The wound was  then well irrigated with sterile saline.  Gelfoam soaked in thrombin was  placed around  the bed of the area of the fragment removal which did have  some oozing.  There was no significant bleeding on closure, but I still  elected to use a 1/4 inch Penrose drain to the right posterior flank.  The  wound was carefully closed up around the drain with interrupted #1 Vicryl in  the paralumbar muscle and fascia, 2-0 Vicryl in the subcutaneous tissue and  staples in the skin. Betadine and Adaptic dry sterile dressing were applied.  He tolerated the procedure well and was taken to the recovery room in  satisfactory condition with no known complications.  Estimated blood loss  was perhaps 250 mL.  No blood replacement.       JA/MEDQ  D:  01/05/2005  T:  01/05/2005  Job:  [id number]

## 2010-10-24 NOTE — H&P (Signed)
Jerome Mays, Jerome Mays                ACCOUNT NO.:  [id number]   MEDICAL RECORD NO.:  [id number]          PATIENT TYPE:  INP   LOCATION:  NA                           [facility]ACILITY:  [facility]   PHYSICIAN:  Marlowe Kays, M.D.  DATE OF BIRTH:  1940-11-23   DATE OF ADMISSION:  03/15/2006  DATE OF DISCHARGE:                                HISTORY & PHYSICAL   CHIEF COMPLAINT:  Pain in my right knee.   PRESENT ILLNESS:  This 70 year old white male is seen by [place] for continued  progressing problems concerning pain into his right knee.  He has had  crepitus with range of motion.  He is a very active gentleman and finds that  he is having continuing pain and discomfort into the knee.  X-rays showed  osteoarthritis of the knee.  After much discussion, including the risks and  benefits of surgery, it is felt he would benefit from surgical intervention  and is being admitted for total knee replacement arthroplasty.   PAST MEDICAL HISTORY:  The patient had history of bronchitis in the past.  He also has some mild emphysema.  Renal calculi about 12 years ago.  He had  a nephrectomy about 13 years ago for kidney cancer.  He has had several  fractures in his wrists.   PAST SURGERIES:  1. Back surgery for lumbar laminectomy 2006.  2. Left arm surgery 12 years ago.  3. Nephrectomy 13 years ago.  4. Right knee ligamentous repair from old injury.   CURRENT MEDICATIONS:  1. Allopurinol one daily for gout.  2. AcipHex 20 mg one daily.  3. Levitra 20 mg p.r.n.  4. Lexapro 20 mg one p.r.n.  5. Vitamins.  6. Cosamin.  7. Aspirin 81 mg daily (will stop prior to surgery).  8. Fexofenadine p.r.n.   ALLERGIES:  He has no medical allergies.   FAMILY PHYSICIAN:  Dr. Renette Butters in [place].   FAMILY HISTORY:  Positive for diabetes in mother.   SOCIAL HISTORY:  The patient is married, retired, no intake of alcohol or  tobacco products.  Has three children.  His caregiver after surgery will be  his wife.   REVIEW OF SYSTEMS:  CNS:  No seizures, shoulder paralysis, numbness, double  vision.  RESPIRATORY:  No productive cough, no hemoptysis, no shortness  breath.  CARDIOVASCULAR:  No chest pain, no angina, no orthopnea.  GASTROINTESTINAL:  No nausea, vomiting, melena or bloody stools.  GENITOURINARY:  No discharge, dysuria or hematuria.  MUSCULOSKELETAL:  Primarily in present illness.   PHYSICAL EXAMINATION:  GENERAL:  Alert, cooperative and friendly 70 year old  white male who is accompanied by his wife.  VITAL SIGNS:  Blood pressure 128/76, pulse 80, respirations 14.  HEENT: Normocephalic.  Pupils equal, round and reactive to light and  accommodation.  Extraocular movements intact.  Oropharynx is clear.  CHEST:  Clear to auscultation.  No rhonchi, no rales.  HEART:  Regular rate and rhythm.  No murmurs are heard.  ABDOMEN:  Soft, nontender.  Liver and spleen not felt.  GENITALIA:  Rectal not done, not pertinent to present illness.  EXTREMITIES:  Crepitus with range of motion of the right knee which is quite  painful.   ADMISSION DIAGNOSES:  1. Osteoarthritis of the right knee.  2. Gout.  3. Chronic obstructive pulmonary disease, emphysema.  4. History of nephrectomy secondary to cancer.   PLAN:  The patient will undergo right total knee replacement arthroplasty.      Dooley L. Cherlynn June.    ______________________________  Marlowe Kays, M.D.    DLU/MEDQ  D:  03/04/2006  T:  03/05/2006  Job:  [id number]

## 2010-10-24 NOTE — Discharge Summary (Signed)
NAMEMARVIS, Mays                ACCOUNT NO.:  [id number]   MEDICAL RECORD NO.:  [id number]          PATIENT TYPE:  INP   LOCATION:  1512                         FACILITY:  [facility]   PHYSICIAN:  Marlowe Kays, M.D.  DATE OF BIRTH:  Feb 07, 1941   DATE OF ADMISSION:  03/15/2006  DATE OF DISCHARGE:  03/22/2006                                 DISCHARGE SUMMARY   ADMITTING DIAGNOSES:  1. Osteoarthritis of the right knee.  2. Gout.  3. Chronic obstructive pulmonary disease with emphysema.  4. History of nephrectomy secondary to cancer.   DISCHARGE DIAGNOSES:  1. Osteoarthritis of the right knee.  2. Gout.  3. Chronic obstructive pulmonary disease with emphysema.  4. History of nephrectomy secondary to cancer.  5. Postoperative anemia.  6. Postoperative ileus.   CONSULTATIONS:  Gastroenterology (signature unreadable).   PROCEDURE:  On March 15, 2006, the patient underwent Osteonics total  replacement arthroplasty of the right knee, D.L. Idolina Primer PA-C assisted.   HISTORY OF PRESENT ILLNESS:  This 70 year old gentleman is having more and  more pain into his right knee to the point where he has difficulty getting  around.  Crepitus was noted on examination of the knee as well as pain.  X-  ray showed deteriorating joint with severe osteoarthritis.  After much  discussion including the risks and benefits of surgery, he decided he would  benefit from the above procedure and was scheduled for same.   HOSPITAL COURSE:  The patient tolerated surgical procedure quite well.  Hemovac was removed with no difficulty.  It was noted he had a mild  hyponatremia and this was treated with water restrictions.  We tried to get  him off the PCA pump as soon as possible to p.o. medications.  The patient's  abdomen was distended and somewhat tender.  Bowel sounds were hypoactive and  placed on ileus protocol.  His COPD postoperatively flared somewhat on the  second postop day.  We ordered handheld  nebulizer and albuterol and this  helped with his wheezing considerably.  Due to his constipation, we asked  for a GI consult.  They gave primarily MiraLax.  The patient did well with  that and eventually had a BM.   Orthopedically, the patient began ambulating in the hall doing really quite  well with his total knee.  Wound remained clean and dry and neurovascularly  remained intact to the operative extremity.  It was felt he could be  maintained at home with home health and arrangements were made for  discharge.  Laboratory values the hospital showed a preoperative CBC  essentially within normal limits.  Hemoglobin was 14.9, hematocrit 42.5.  Final hemoglobin was 10.9, hematocrit 31.6.  Blood chemistries, other than a  very mild hyponatremia, remained normal.  Urinalysis negative for urinary  tract infection.  Electrocardiogram showed sinus bradycardia, otherwise  normal.  Preoperative chest x-ray showed COPD with no active lung disease.  There is question of a small nodule in the right upper lung field read by  Dr. Audie Pinto and he recommended followup chest x-ray in 3-4 months.  The  followup chest x-ray for his wheezing, which was mentioned above, showed  patchy bibasilar atelectasis infiltrate with chronic bronchitic changes.  The abdominal specific x-ray showed nonspecific, nonobstructive bowel gas  pattern.   CONDITION ON DISCHARGE:  Improved, stable.   ACTIVITY:  The patient is to continue weightbearing as tolerated.  Use ice  to his knee.   WOUND CARE:  Use dry dressing p.r.n..   DISCHARGE MEDICATIONS:  1. Continue with Coumadin per pharmacy protocol x4 weeks after date of      surgery.  2. Robaxin as a muscle relaxant.  3. Trinsicon for his postoperative anemia.  4. Vicodin for discomfort.  5. Recommend he get over-the-counter laxative of choice and enema of      choice and uses stool softeners as well.   SPECIAL INSTRUCTIONS:  Should he have any further GI problems,  follow with  the gastroenterologist and see Dr. Simonne Come 2 weeks after date of surgery.      Dooley L. Cherlynn June.    ______________________________  Marlowe Kays, M.D.    DLU/MEDQ  D:  04/06/2006  T:  04/06/2006  Job:  [id number]

## 2010-10-24 NOTE — Discharge Summary (Signed)
Jerome Mays, Jerome Mays                ACCOUNT NO.:  [id number]   MEDICAL RECORD NO.:  [id number]          PATIENT TYPE:  INP   LOCATION:  1510                         FACILITY:  [facility]   PHYSICIAN:  Marlowe Kays, M.D.  DATE OF BIRTH:  [birth year]   DATE OF ADMISSION:  01/05/2005  DATE OF DISCHARGE:  01/08/2005                                 DISCHARGE SUMMARY   ADMITTING DIAGNOSES:  1.  Spinal stenosis L2-L3, L3-4, L4-5.  2.  Herniated nucleus pulposus with free fragment L4-L5.  3.  Hypertension.   DISCHARGE DIAGNOSES:  1.  Spinal stenosis L2-L3, L3-4, L4-5.  2.  Herniated nucleus pulposus with free fragment L4-L5.  3.  Hypertension.  4.  Mild constipation.   OPERATION:  On January 05, 2005, the patient underwent central decompressive  lumbar laminectomy L2-L3, L3-4, L4-5 and microdiskectomy with removal of  large free fragment disk at the L4-5 on the left, Dr. Ranee Gosselin  assisted.   BRIEF HISTORY:  This 70 year old white male with a nearly 55-month history of  progressive low back pain with radiation to the left lower extremity. He  developed weakness and dorsiflexion of the left foot as well. MRI showed  disk bulges at several level but a large free fragment seen at L4-5 was most  prominent. Myelogram CT scan was further ordered to illustrate the pathology  in the lumbar spine and severe spinal stenosis was seen at L4-5,  the lesser  at L3-L4 as well as L2-L3. Because of the combined findings, it was decided  that the above procedure was needed. The patient agreed after risks and  benefits have been described to him.   COURSE IN HOSPITAL:  The patient tolerated the surgical procedure quite  well. He did have some mild obstipation postoperatively with minimal  tenderness. Bowel sounds were present all quadrants but he was somewhat  distended and tender. We had to keep him and check his potassium which  fortunately was normal. We sent the patient home with Dulcolax  recommendations and to call us should he have any problems. On the day of  discharge, the wound was dry to his back, his lower extremity cramping which  he had postoperatively had markedly improved and essentially pain free in  the left lower extremity. Neurovascular was grossly intact in the lower  extremities. He was ambulating in the hall and he was voiding quite well.  Laboratory values in the hospital hematologically showed a CBC with  differential completely within normal limits. Potassium remained essentially  normal. Urinalysis negative for urinary tract infection. Electrocardiogram  showed sinus bradycardia. The X chest x-ray showed changes of COPD and  chronic bronchitis.   CONDITION ON DISCHARGE:  Improved and stable.   PLAN:  The patient is to continue with his home medication and diet. He was  given Robaxin as a muscle relaxant, Tylox for discomfort and Colace 100 mg  daily and use Dulcolax over the counter as needed for BM. Should he have any  further constipation, he is to contact us and we will certainly make  arrangements for a gastroenterology consult.  Druscilla Brownie   DLU/MEDQ  D:  01/19/2005  T:  01/19/2005  Job:  [id number]

## 2011-01-07 HISTORY — PX: PENILE PROSTHESIS IMPLANT: SHX240

## 2011-01-11 ENCOUNTER — Other Ambulatory Visit: Payer: Self-pay | Admitting: Critical Care Medicine

## 2011-03-06 DIAGNOSIS — N486 Induration penis plastica: Secondary | ICD-10-CM | POA: Insufficient documentation

## 2011-03-24 ENCOUNTER — Encounter: Payer: Self-pay | Admitting: Internal Medicine

## 2011-03-25 ENCOUNTER — Ambulatory Visit (INDEPENDENT_AMBULATORY_CARE_PROVIDER_SITE_OTHER): Payer: Medicare Other | Admitting: Critical Care Medicine

## 2011-03-25 ENCOUNTER — Encounter: Payer: Self-pay | Admitting: Critical Care Medicine

## 2011-03-25 VITALS — BP 148/78 | HR 73 | Temp 97.8°F | Ht 73.0 in | Wt 221.0 lb

## 2011-03-25 DIAGNOSIS — J449 Chronic obstructive pulmonary disease, unspecified: Secondary | ICD-10-CM

## 2011-03-25 DIAGNOSIS — J209 Acute bronchitis, unspecified: Secondary | ICD-10-CM

## 2011-03-25 MED ORDER — FLUTICASONE-SALMETEROL 250-50 MCG/DOSE IN AEPB: 1.0000 | INHALATION_SPRAY | Freq: Two times a day (BID) | RESPIRATORY_TRACT | Status: DC

## 2011-03-25 MED ORDER — AZITHROMYCIN 250 MG PO TABS: ORAL_TABLET | ORAL | Status: DC

## 2011-03-25 NOTE — Assessment & Plan Note (Signed)
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

## 2011-03-25 NOTE — Progress Notes (Signed)
Subjective:    Patient ID: Jerome Mays, male    DOB: 27-Jun-1940, 70 y.o.   MRN: [id number]  HPI [name] with COPD /AB golds stage I on PFTs 8/10: FeV1 77% Fev1/FVC 62%  May 29, 2009 11:41 AM  F/U copd/ab. Pt with minimal mucous, much improved from the past summer.  No chest pain. No wheeze. Pt denies any significant sore throat, nasal congestion or excess secretions, fever, chills, sweats, unintended weight loss, pleurtic or exertional chest pain, orthopnea PND, or leg swelling  Pt denies any increase in rescue therapy over baseline, denies waking up needing it or having any early am or nocturnal exacerbations of coughing/wheezing/or dyspnea.  There are not any alleviating or precipitating factors noted. The symptoms do not generally fluctuate.  August 21, 2009 9:08 AM  this pt is referred for abnormal CT chest. This was obtained to evaluate interval changes as it related to renal cell CA to eval for poss recurrence. The CT revealed hilar and mediastinal LAN.  The pt is followed here for Copd Gold Stage I  There more cough. Pt denies any significant sore throat, nasal congestion or excess secretions, fever, chills, sweats, unintended weight loss, pleurtic or exertional chest pain, orthopnea PND, or leg swelling  Pt denies any increase in rescue therapy over baseline, denies waking up needing it or having any early am or nocturnal exacerbations of coughing/wheezing/or dyspnea.  September 10, 2009 1:39 PM  the patient underwent an EBUS procedure for mediastinal adenopathy. The results revealed no malignancy. Three lymph nodes were sampled.for  The patient has a prior history of renal cell carcinoma with resection. Patient returns for follow-up. The patient denies any current complaints there is no dyspnea, cough, or chest pain.  Pt denies any significant sore throat, nasal congestion or excess secretions, fever, chills, sweats, unintended weight loss, pleurtic or exertional chest pain, orthopnea  PND, or leg swelling  Pt denies any increase in rescue therapy over baseline, denies waking up needing it or having any early am or nocturnal exacerbations of coughing/wheezing/or dyspnea.  July 14, 2010 9:03 AM  No real changes, but had a bad cold one week ago. Now not coughing. No real wheeze, but still is congested. No pndrip.  No f/c/s. No cp. Just had heart cath and was "ok".  Pt denies any significant sore throat, nasal congestion or excess secretions, fever, chills, sweats, unintended weight loss, pleurtic or exertional chest pain, orthopnea PND, or leg swelling  Pt denies any increase in rescue therapy over baseline, denies waking up needing it or having any early am or nocturnal exacerbations of coughing/wheezing/or dyspnea.   03/25/2011  Not seen since 07/14/10.  Now for 3 weeks more cough productive yellow mucus.  Notes more dyspnea with exertion not at rest.  NO real chest pain.  No real edema.  Notes some pndrip.    Review of Systems Constitutional:   No  weight loss, night sweats,  Fevers, chills, fatigue, lassitude. HEENT:   No headaches,  Difficulty swallowing,  Tooth/dental problems,  Sore throat,                No sneezing, itching, ear ache, nasal congestion, post nasal drip,   CV:  No chest pain,  Orthopnea, PND, swelling in lower extremities, anasarca, dizziness, palpitations  GI  No heartburn, indigestion, abdominal pain, nausea, vomiting, diarrhea, change in bowel habits, loss of appetite  Resp: Notes shortness of breath with exertion not at rest.  Notes excess mucus, notes productive  cough,  No non-productive cough,  No coughing up of blood.  No change in color of mucus.  Notes  wheezing.  No chest wall deformity  Skin: no rash or lesions.  GU: no dysuria, change in color of urine, no urgency or frequency.  No flank pain.  MS:  No joint pain or swelling.  No decreased range of motion.  No back pain.  Psych:  No change in mood or affect. No depression or anxiety.   No memory loss.     Objective:   Physical Exam  Filed Vitals:   03/25/11 1407  BP: 148/78  Pulse: 73  Temp: 97.8 F (36.6 C)  TempSrc: Oral  Height: 6\' 1"  (1.854 m)  Weight: 221 lb (100.245 kg)  SpO2: 93%    Gen: Pleasant, well-nourished, in no distress,  normal affect  ENT: No lesions,  mouth clear,  oropharynx clear, +++ postnasal drip  Neck: No JVD, no TMG, no carotid bruits  Lungs: No use of accessory muscles, no dullness to percussion, exp wheeze   Cardiovascular: RRR, heart sounds normal, no murmur or gallops, no peripheral edema  Abdomen: soft and NT, no HSM,  BS normal  Musculoskeletal: No deformities, no cyanosis or clubbing  Neuro: alert, non focal  Skin: Warm, no lesions or rashes       Assessment & Plan:   COPD 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 allopurinol (ZYLOPRIM) 300 MG tablet Take 300 mg by mouth daily.        [name] aspirin 81 MG tablet Take 81 mg by mouth daily.        . Fluticasone-Salmeterol (ADVAIR DISKUS) 250-50 MCG/DOSE AEPB Inhale 1 puff into the lungs 2 (two) times daily.  60 each  11  . guaiFENesin (MUCINEX) 600 MG 12 hr tablet Take 1,200 mg by mouth daily.       . meloxicam (MOBIC) 15 MG tablet Take 15 mg by mouth daily.       . Multiple Vitamin (MULTIVITAMIN) capsule Take 1 capsule by mouth daily.        [name] omeprazole (PRILOSEC) 20 MG capsule Take 20 mg by mouth daily.        . sildenafil (VIAGRA) 100 MG tablet Take 100 mg by mouth daily as needed.        [name] DISCONTD: ADVAIR DISKUS 250-50 MCG/DOSE AEPB inhale 1 dose by mouth twice a day  60 each  2  . azithromycin (ZITHROMAX) 250 MG tablet Take two once then one daily until gone  6 each  0  . DISCONTD: pravastatin (PRAVACHOL) 40 MG  tablet Take 40 mg by mouth at bedtime as needed.

## 2011-03-25 NOTE — Patient Instructions (Signed)
Take azithromycin 250mg  Take two once then one daily until gone Advair refill will be sent to the pharmacy Return 4 months

## 2011-06-08 ENCOUNTER — Telehealth: Payer: Self-pay | Admitting: Critical Care Medicine

## 2011-06-08 NOTE — Telephone Encounter (Signed)
All Dr.Wright's Records faxed to [facility] @ [phone] 06/08/11/KM

## 2011-07-15 ENCOUNTER — Encounter: Payer: Self-pay | Admitting: Critical Care Medicine

## 2011-07-15 ENCOUNTER — Ambulatory Visit (INDEPENDENT_AMBULATORY_CARE_PROVIDER_SITE_OTHER): Payer: Medicare Other | Admitting: Critical Care Medicine

## 2011-07-15 DIAGNOSIS — J4489 Other specified chronic obstructive pulmonary disease: Secondary | ICD-10-CM

## 2011-07-15 DIAGNOSIS — J449 Chronic obstructive pulmonary disease, unspecified: Secondary | ICD-10-CM

## 2011-07-15 MED ORDER — ROFLUMILAST 500 MCG PO TABS: 500.0000 ug | ORAL_TABLET | Freq: Every day | ORAL | Status: DC

## 2011-07-15 NOTE — Assessment & Plan Note (Signed)
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

## 2011-07-15 NOTE — Progress Notes (Signed)
Subjective:    Patient ID: Jerome Mays, male    DOB: Feb 22, 1941, 71 y.o.   MRN: [id number]  HPI  71yo WM with COPD /AB golds stage I on PFTs 8/10: FeV1 77% Fev1/FVC 62%   07/15/2011 Noting more dyspnea recently.  Had URI over holidays and coughed.  Rx Abx and was better. No real wheeze. No chest pain.   No f/c/s.  Past Medical History  Diagnosis Date  . Malignant tumor of kidney   . Renal calculus   . Colonic polyp   . COPD (chronic obstructive pulmonary disease)   . GERD (gastroesophageal reflux disease)   . Depression   . Gout      Family History  Problem Relation Age of Onset  . Diabetes Mother   . COPD Brother   . COPD Sister      History   Social History  . Marital Status: Married    Spouse Name: N/A    Number of Children: N/A  . Years of Education: N/A   Occupational History  . Not on file.   Social History Main Topics  . Smoking status: Former Smoker -- 2.0 packs/day for 40 years    Types: Cigarettes, Pipe    Quit date: 06/09/1995  . Smokeless tobacco: Never Used  . Alcohol Use: No  . Drug Use: No  . Sexually Active: Not on file   Other Topics Concern  . Not on file   Social History Narrative  . No narrative on file     Allergies  Allergen Reactions  . Atorvastatin      Outpatient Prescriptions Prior to Visit  Medication Sig Dispense Refill  . allopurinol (ZYLOPRIM) 300 MG tablet Take 300 mg by mouth daily.        . Fluticasone-Salmeterol (ADVAIR DISKUS) 250-50 MCG/DOSE AEPB Inhale 1 puff into the lungs 2 (two) times daily.  60 each  11  . guaiFENesin (MUCINEX) 600 MG 12 hr tablet Take 600 mg by mouth 2 (two) times daily.       . meloxicam (MOBIC) 15 MG tablet Take 15 mg by mouth daily.       . Multiple Vitamin (MULTIVITAMIN) capsule Take 1 capsule by mouth daily.        [name] omeprazole (PRILOSEC) 20 MG capsule Take 20 mg by mouth daily.        . sildenafil (VIAGRA) 100 MG tablet Take 100 mg by mouth daily as needed.        [name] aspirin 81 MG  tablet Take 81 mg by mouth daily.        [name] azithromycin (ZITHROMAX) 250 MG tablet Take two once then one daily until gone  6 each  0     Review of Systems  Constitutional:   No  weight loss, night sweats,  Fevers, chills, fatigue, lassitude. HEENT:   No headaches,  Difficulty swallowing,  Tooth/dental problems,  Sore throat,                No sneezing, itching, ear ache, nasal congestion, post nasal drip,   CV:  No chest pain,  Orthopnea, PND, swelling in lower extremities, anasarca, dizziness, palpitations  GI  No heartburn, indigestion, abdominal pain, nausea, vomiting, diarrhea, change in bowel habits, loss of appetite  Resp: Notes shortness of breath with exertion not at rest.  Notes excess mucus, notes productive cough,  No non-productive cough,  No coughing up of blood.  No change in color of mucus.  Notes  wheezing.  No chest wall deformity  Skin: no rash or lesions.  GU: no dysuria, change in color of urine, no urgency or frequency.  No flank pain.  MS:  No joint pain or swelling.  No decreased range of motion.  No back pain.  Psych:  No change in mood or affect. No depression or anxiety.  No memory loss.     Objective:   Physical Exam   Filed Vitals:   07/15/11 1049  BP: 112/58  Pulse: 59  Temp: 97.6 F (36.4 C)  TempSrc: Oral  Height: 6\' 1"  (1.854 m)  Weight: 222 lb 12.8 oz (101.061 kg)  SpO2: 91%    Gen: Pleasant, well-nourished, in no distress,  normal affect  ENT: No lesions,  mouth clear,  oropharynx clear, +++ postnasal drip  Neck: No JVD, no TMG, no carotid bruits  Lungs: No use of accessory muscles, no dullness to percussion, exp wheeze   Cardiovascular: RRR, heart sounds normal, no murmur or gallops, no peripheral edema  Abdomen: soft and NT, no HSM,  BS normal  Musculoskeletal: No deformities, no cyanosis or clubbing  Neuro: alert, non focal  Skin: Warm, no lesions or rashes       Assessment & Plan:   COPD Stage III COPD with  frequent and recurrent exacerbations on maximal inhaler therapy Plan Will add Daliresp 500 mcg daily Maintain inhaled medications as prescribed     Updated Medication List Outpatient Encounter Prescriptions as of 07/15/2011  Medication Sig Dispense Refill  . allopurinol (ZYLOPRIM) 300 MG tablet Take 300 mg by mouth daily.        . Fluticasone-Salmeterol (ADVAIR DISKUS) 250-50 MCG/DOSE AEPB Inhale 1 puff into the lungs 2 (two) times daily.  60 each  11  . guaiFENesin (MUCINEX) 600 MG 12 hr tablet Take 600 mg by mouth 2 (two) times daily.       . meloxicam (MOBIC) 15 MG tablet Take 15 mg by mouth daily.       . Multiple Vitamin (MULTIVITAMIN) capsule Take 1 capsule by mouth daily.        [name] omeprazole (PRILOSEC) 20 MG capsule Take 20 mg by mouth daily.        [name] DISCONTD: sildenafil (VIAGRA) 100 MG tablet Take 100 mg by mouth daily as needed.        [name] aspirin 81 MG tablet Take 81 mg by mouth daily.        . roflumilast (DALIRESP) 500 MCG TABS tablet Take 1 tablet (500 mcg total) by mouth daily.  30 tablet  6  . DISCONTD: azithromycin (ZITHROMAX) 250 MG tablet Take two once then one daily until gone  6 each  0

## 2011-07-15 NOTE — Patient Instructions (Signed)
Daliresp one daily  No other changes in medications Return 2 months

## 2011-09-16 ENCOUNTER — Ambulatory Visit: Payer: Medicare Other | Admitting: Critical Care Medicine

## 2011-11-06 ENCOUNTER — Encounter: Payer: Self-pay | Admitting: Critical Care Medicine

## 2011-11-06 ENCOUNTER — Ambulatory Visit (INDEPENDENT_AMBULATORY_CARE_PROVIDER_SITE_OTHER): Payer: Medicare Other | Admitting: Critical Care Medicine

## 2011-11-06 VITALS — BP 128/62 | HR 63 | Temp 98.3°F | Ht 72.0 in | Wt 213.0 lb

## 2011-11-06 DIAGNOSIS — J449 Chronic obstructive pulmonary disease, unspecified: Secondary | ICD-10-CM

## 2011-11-06 NOTE — Patient Instructions (Signed)
REsume Daliresp one three times weekly, for one week then one every other day for one week then daily. Call with results , if any diarrhea Stay on Advair Return 4 months

## 2011-11-06 NOTE — Progress Notes (Signed)
Subjective:    Patient ID: Jerome Mays, male    DOB: July 17, 1940, 71 y.o.   MRN: [id number]  HPI  71yo WM with COPD /AB golds stage I on PFTs 8/10: FeV1 77% Fev1/FVC 62%   07/15/11 Noting more dyspnea recently.  Had URI over holidays and coughed.  Rx Abx and was better. No real wheeze. No chest pain.   No f/c/s.  11/06/2011 Quit daliresp after two weeks d/t diarrhea.  Dyspnea is the same.  No further flares since 2/13. No chest pain.   Pt denies any significant sore throat, nasal congestion or excess secretions, fever, chills, sweats, unintended weight loss, pleurtic or exertional chest pain, orthopnea PND, or leg swelling Pt denies any increase in rescue therapy over baseline, denies waking up needing it or having any early am or nocturnal exacerbations of coughing/wheezing/or dyspnea. Pt also denies any obvious fluctuation in symptoms with  weather or environmental change or other alleviating or aggravating factors     Past Medical History  Diagnosis Date  . Malignant tumor of kidney   . Renal calculus   . Colonic polyp   . COPD (chronic obstructive pulmonary disease)   . GERD (gastroesophageal reflux disease)   . Depression   . Gout      Family History  Problem Relation Age of Onset  . Diabetes Mother   . COPD Brother   . COPD Sister      History   Social History  . Marital Status: Married    Spouse Name: N/A    Number of Children: N/A  . Years of Education: N/A   Occupational History  . Not on file.   Social History Main Topics  . Smoking status: Former Smoker -- 2.0 packs/day for 40 years    Types: Cigarettes, Pipe    Quit date: 06/09/1995  . Smokeless tobacco: Never Used  . Alcohol Use: No  . Drug Use: No  . Sexually Active: Not on file   Other Topics Concern  . Not on file   Social History Narrative  . No narrative on file     Allergies  Allergen Reactions  . Atorvastatin      Outpatient Prescriptions Prior to Visit  Medication Sig  Dispense Refill  . allopurinol (ZYLOPRIM) 300 MG tablet Take 300 mg by mouth daily.        [name] aspirin 81 MG tablet Take 81 mg by mouth daily.        . Fluticasone-Salmeterol (ADVAIR DISKUS) 250-50 MCG/DOSE AEPB Inhale 1 puff into the lungs 2 (two) times daily.  60 each  11  . guaiFENesin (MUCINEX) 600 MG 12 hr tablet Take 600 mg by mouth 2 (two) times daily.       . meloxicam (MOBIC) 15 MG tablet Take 15 mg by mouth daily.       . Multiple Vitamin (MULTIVITAMIN) capsule Take 1 capsule by mouth daily.        [name] omeprazole (PRILOSEC) 20 MG capsule Take 20 mg by mouth daily.        . roflumilast (DALIRESP) 500 MCG TABS tablet Take 1 tablet (500 mcg total) by mouth daily.  30 tablet  6     Review of Systems  Constitutional:   No  weight loss, night sweats,  Fevers, chills, fatigue, lassitude. HEENT:   No headaches,  Difficulty swallowing,  Tooth/dental problems,  Sore throat,                No sneezing, itching,  ear ache, nasal congestion, post nasal drip,   CV:  No chest pain,  Orthopnea, PND, swelling in lower extremities, anasarca, dizziness, palpitations  GI  No heartburn, indigestion, abdominal pain, nausea, vomiting, diarrhea, change in bowel habits, loss of appetite  Resp: Notes shortness of breath with exertion not at rest.  Notes excess mucus, notes productive cough,  No non-productive cough,  No coughing up of blood.  No change in color of mucus.  Notes  wheezing.  No chest wall deformity  Skin: no rash or lesions.  GU: no dysuria, change in color of urine, no urgency or frequency.  No flank pain.  MS:  No joint pain or swelling.  No decreased range of motion.  No back pain.  Psych:  No change in mood or affect. No depression or anxiety.  No memory loss.     Objective:   Physical Exam   Filed Vitals:   11/06/11 0941  BP: 128/62  Pulse: 63  Temp: 98.3 F (36.8 C)  TempSrc: Oral  Height: 6' (1.829 m)  Weight: 213 lb (96.616 kg)  SpO2: 95%    Gen: Pleasant,  well-nourished, in no distress,  normal affect  ENT: No lesions,  mouth clear,  oropharynx clear, + postnasal drip  Neck: No JVD, no TMG, no carotid bruits  Lungs: No use of accessory muscles, no dullness to percussion, no wheeze   Cardiovascular: RRR, heart sounds normal, no murmur or gallops, no peripheral edema  Abdomen: soft and NT, no HSM,  BS normal  Musculoskeletal: No deformities, no cyanosis or clubbing  Neuro: alert, non focal  Skin: Warm, no lesions or rashes       Assessment & Plan:   COPD Gold stage C. COPD with frequent exacerbations  intolerance to daliresp due to diarrhea but exacerbated by MiraLAX use Plan Resume Daliresp with a slow titrating dose and avoid MiraLAX use during titration Maintain Advair at current prescribed dose     Updated Medication List Outpatient Encounter Prescriptions as of 11/06/2011  Medication Sig Dispense Refill  . allopurinol (ZYLOPRIM) 300 MG tablet Take 300 mg by mouth daily.        [name] aspirin 81 MG tablet Take 81 mg by mouth daily.        . Fluticasone-Salmeterol (ADVAIR DISKUS) 250-50 MCG/DOSE AEPB Inhale 1 puff into the lungs 2 (two) times daily.  60 each  11  . guaiFENesin (MUCINEX) 600 MG 12 hr tablet Take 600 mg by mouth 2 (two) times daily.       . meloxicam (MOBIC) 15 MG tablet Take 15 mg by mouth daily.       . Multiple Vitamin (MULTIVITAMIN) capsule Take 1 capsule by mouth daily.        [name] omeprazole (PRILOSEC) 20 MG capsule Take 20 mg by mouth daily.        [name] POTASSIUM PO Take 1 tablet by mouth daily. OTC -- unsure of the strength      . roflumilast (DALIRESP) 500 MCG TABS tablet Take 1 tablet (500 mcg total) by mouth daily.  30 tablet  6

## 2011-11-06 NOTE — Assessment & Plan Note (Signed)
Gold stage C. COPD with frequent exacerbations  intolerance to daliresp due to diarrhea but exacerbated by MiraLAX use Plan Resume Daliresp with a slow titrating dose and avoid MiraLAX use during titration Maintain Advair at current prescribed dose

## 2011-12-15 ENCOUNTER — Telehealth: Payer: Self-pay | Admitting: Critical Care Medicine

## 2011-12-15 NOTE — Telephone Encounter (Signed)
Great!

## 2011-12-15 NOTE — Telephone Encounter (Signed)
I spoke with spouse she states she is calling to let PW know pt is doing well on the daliresp. He is taking once a day and is not having any problems with it. It has helped with his breathing per spouse. Pt did not need rx sent to the pharmacy. Will forward to Dr. Delford Field as an Lorain Childes.

## 2012-01-27 DIAGNOSIS — C649 Malignant neoplasm of unspecified kidney, except renal pelvis: Secondary | ICD-10-CM | POA: Insufficient documentation

## 2012-03-04 ENCOUNTER — Encounter: Payer: Self-pay | Admitting: Critical Care Medicine

## 2012-03-04 ENCOUNTER — Ambulatory Visit (INDEPENDENT_AMBULATORY_CARE_PROVIDER_SITE_OTHER): Payer: Medicare Other | Admitting: Critical Care Medicine

## 2012-03-04 VITALS — BP 124/76 | HR 69 | Temp 98.3°F | Ht 73.0 in | Wt 207.6 lb

## 2012-03-04 DIAGNOSIS — M109 Gout, unspecified: Secondary | ICD-10-CM

## 2012-03-04 DIAGNOSIS — J449 Chronic obstructive pulmonary disease, unspecified: Secondary | ICD-10-CM

## 2012-03-04 NOTE — Assessment & Plan Note (Addendum)
Gold stage C. COPD stable at this time with marked reduction in exacerbation rate now that the patient is on Daliresp Plan Maintain inhaled medications as prescribed Maintain Daliresp Administer flu vaccine

## 2012-03-04 NOTE — Progress Notes (Signed)
Subjective:    Patient ID: Jerome Mays, male    DOB: 1941-04-05, 71 y.o.   MRN: [id number]  HPI  71yo WM with COPD /AB golds stage I on PFTs 8/10: FeV1 77% Fev1/FVC 62%   07/15/11 Noting more dyspnea recently.  Had URI over holidays and coughed.  Rx Abx and was better. No real wheeze. No chest pain.   No f/c/s.  11/06/2011 Quit daliresp after two weeks d/t diarrhea.  Dyspnea is the same.  No further flares since 2/13. No chest pain.   Pt denies any significant sore throat, nasal congestion or excess secretions, fever, chills, sweats, unintended weight loss, pleurtic or exertional chest pain, orthopnea PND, or leg swelling Pt denies any increase in rescue therapy over baseline, denies waking up needing it or having any early am or nocturnal exacerbations of coughing/wheezing/or dyspnea. Pt also denies any obvious fluctuation in symptoms with  weather or environmental change or other alleviating or aggravating factors  03/04/2012 No flares since last OV .  Now not much cough.  Dyspnea about the same.  No chest pain. No edema. Pt denies any significant sore throat, nasal congestion or excess secretions, fever, chills, sweats, unintended weight loss, pleurtic or exertional chest pain, orthopnea PND, or leg swelling Pt denies any increase in rescue therapy over baseline, denies waking up needing it or having any early am or nocturnal exacerbations of coughing/wheezing/or dyspnea. Pt also denies any obvious fluctuation in symptoms with  weather or environmental change or other alleviating or aggravating factors    Past Medical History  Diagnosis Date  . Malignant tumor of kidney   . Renal calculus   . Colonic polyp   . COPD (chronic obstructive pulmonary disease)   . GERD (gastroesophageal reflux disease)   . Depression   . Gout      Family History  Problem Relation Age of Onset  . Diabetes Mother   . COPD Brother   . COPD Sister      History   Social History  . Marital  Status: Married    Spouse Name: N/A    Number of Children: N/A  . Years of Education: N/A   Occupational History  . Not on file.   Social History Main Topics  . Smoking status: Former Smoker -- 2.0 packs/day for 40 years    Types: Cigarettes, Pipe    Quit date: 06/09/1995  . Smokeless tobacco: Never Used  . Alcohol Use: No  . Drug Use: No  . Sexually Active: Not on file   Other Topics Concern  . Not on file   Social History Narrative  . No narrative on file     Allergies  Allergen Reactions  . Atorvastatin      Outpatient Prescriptions Prior to Visit  Medication Sig Dispense Refill  . allopurinol (ZYLOPRIM) 300 MG tablet Take 300 mg by mouth daily.        [name] aspirin 81 MG tablet Take 81 mg by mouth daily.        . Fluticasone-Salmeterol (ADVAIR DISKUS) 250-50 MCG/DOSE AEPB Inhale 1 puff into the lungs 2 (two) times daily.  60 each  11  . guaiFENesin (MUCINEX) 600 MG 12 hr tablet Take 600 mg by mouth 2 (two) times daily as needed.       . meloxicam (MOBIC) 15 MG tablet Take 15 mg by mouth daily.       . Multiple Vitamin (MULTIVITAMIN) capsule Take 1 capsule by mouth daily.        [name]  omeprazole (PRILOSEC) 20 MG capsule Take 20 mg by mouth daily.        [name] POTASSIUM PO Take 1 tablet by mouth daily. OTC -- unsure of the strength      . roflumilast (DALIRESP) 500 MCG TABS tablet Take 1 tablet (500 mcg total) by mouth daily.  30 tablet  6     Review of Systems  Constitutional:   No  weight loss, night sweats,  Fevers, chills, fatigue, lassitude. HEENT:   No headaches,  Difficulty swallowing,  Tooth/dental problems,  Sore throat,                No sneezing, itching, ear ache, nasal congestion, post nasal drip,   CV:  No chest pain,  Orthopnea, PND, swelling in lower extremities, anasarca, dizziness, palpitations  GI  No heartburn, indigestion, abdominal pain, nausea, vomiting, diarrhea, change in bowel habits, loss of appetite  Resp: Notes shortness of breath with exertion  not at rest.  Notes excess mucus, notes productive cough,  No non-productive cough,  No coughing up of blood.  No change in color of mucus.  Notes  wheezing.  No chest wall deformity  Skin: no rash or lesions.  GU: no dysuria, change in color of urine, no urgency or frequency.  No flank pain.  MS:  No joint pain or swelling.  No decreased range of motion.  No back pain.  Psych:  No change in mood or affect. No depression or anxiety.  No memory loss.     Objective:   Physical Exam   Filed Vitals:   03/04/12 1456  BP: 124/76  Pulse: 69  Temp: 98.3 F (36.8 C)  TempSrc: Oral  Height: 6\' 1"  (1.854 m)  Weight: 207 lb 9.6 oz (94.167 kg)  SpO2: 92%    Gen: Pleasant, well-nourished, in no distress,  normal affect  ENT: No lesions,  mouth clear,  oropharynx clear, + postnasal drip  Neck: No JVD, no TMG, no carotid bruits  Lungs: No use of accessory muscles, no dullness to percussion, no wheeze   Cardiovascular: RRR, heart sounds normal, no murmur or gallops, no peripheral edema  Abdomen: soft and NT, no HSM,  BS normal  Musculoskeletal: No deformities, no cyanosis or clubbing  Neuro: alert, non focal  Skin: Warm, no lesions or rashes       Assessment & Plan:   COPD, Gold C  Gold stage C. COPD stable at this time with marked reduction in exacerbation rate now that the patient is on Daliresp Plan Maintain inhaled medications as prescribed Maintain Daliresp Administer flu vaccine    Updated Medication List Outpatient Encounter Prescriptions as of 03/04/2012  Medication Sig Dispense Refill  . allopurinol (ZYLOPRIM) 300 MG tablet Take 300 mg by mouth daily.        [name] aspirin 81 MG tablet Take 81 mg by mouth daily.        . Fluticasone-Salmeterol (ADVAIR DISKUS) 250-50 MCG/DOSE AEPB Inhale 1 puff into the lungs 2 (two) times daily.  60 each  11  . guaiFENesin (MUCINEX) 600 MG 12 hr tablet Take 600 mg by mouth 2 (two) times daily as needed.       . meloxicam (MOBIC) 15  MG tablet Take 15 mg by mouth daily.       . Multiple Vitamin (MULTIVITAMIN) capsule Take 1 capsule by mouth daily.        [name] omeprazole (PRILOSEC) 20 MG capsule Take 20 mg by mouth daily.        [name]  POTASSIUM PO Take 1 tablet by mouth daily. OTC -- unsure of the strength      . roflumilast (DALIRESP) 500 MCG TABS tablet Take 1 tablet (500 mcg total) by mouth daily.  30 tablet  6

## 2012-03-04 NOTE — Patient Instructions (Addendum)
No change in medications. Return in         4 months 

## 2012-04-15 ENCOUNTER — Other Ambulatory Visit: Payer: Self-pay | Admitting: Critical Care Medicine

## 2012-04-18 ENCOUNTER — Telehealth: Payer: Self-pay | Admitting: Critical Care Medicine

## 2012-04-18 NOTE — Telephone Encounter (Signed)
Spoke with pt's wife an verified that refill for Advair was sent to pharmacy on 04-15-12.

## 2012-05-13 ENCOUNTER — Other Ambulatory Visit: Payer: Self-pay | Admitting: Critical Care Medicine

## 2012-07-05 ENCOUNTER — Encounter: Payer: Self-pay | Admitting: Critical Care Medicine

## 2012-07-05 ENCOUNTER — Ambulatory Visit (INDEPENDENT_AMBULATORY_CARE_PROVIDER_SITE_OTHER): Payer: Medicare Other | Admitting: Critical Care Medicine

## 2012-07-05 VITALS — BP 138/64 | HR 72 | Temp 96.9°F | Ht 73.0 in | Wt 208.0 lb

## 2012-07-05 DIAGNOSIS — J36 Peritonsillar abscess: Secondary | ICD-10-CM

## 2012-07-05 DIAGNOSIS — J4489 Other specified chronic obstructive pulmonary disease: Secondary | ICD-10-CM

## 2012-07-05 DIAGNOSIS — J449 Chronic obstructive pulmonary disease, unspecified: Secondary | ICD-10-CM

## 2012-07-05 MED ORDER — FLUTICASONE-SALMETEROL 250-50 MCG/DOSE IN AEPB: 1.0000 | INHALATION_SPRAY | Freq: Two times a day (BID) | RESPIRATORY_TRACT | Status: DC

## 2012-07-05 MED ORDER — AMOXICILLIN-POT CLAVULANATE 875-125 MG PO TABS: 1.0000 | ORAL_TABLET | Freq: Two times a day (BID) | ORAL | Status: DC

## 2012-07-05 MED ORDER — ROFLUMILAST 500 MCG PO TABS: 500.0000 ug | ORAL_TABLET | Freq: Every day | ORAL | Status: DC

## 2012-07-05 NOTE — Progress Notes (Signed)
Subjective:    Patient ID: Jerome Mays, male    DOB: 1940/06/12, 72 y.o.   MRN: [id number]  HPI  72yo WM with COPD /AB golds stage I on PFTs 8/10: FeV1 77% Fev1/FVC 62%   07/05/2012 Dyspnea is the same. SOme cough with mucus clear.  No real wheeze. No chest pain.  Sl edema with arthritis in L great toe.  Pt denies any significant sore throat, nasal congestion or excess secretions, fever, chills, sweats, unintended weight loss, pleurtic or exertional chest pain, orthopnea PND, or leg swelling Pt denies any increase in rescue therapy over baseline, denies waking up needing it or having any early am or nocturnal exacerbations of coughing/wheezing/or dyspnea. Pt also denies any obvious fluctuation in symptoms with  weather or environmental change or other alleviating or aggravating factors   Past Medical History  Diagnosis Date  . Malignant tumor of kidney   . Renal calculus   . Colonic polyp   . COPD (chronic obstructive pulmonary disease)   . GERD (gastroesophageal reflux disease)   . Depression   . Gout      Family History  Problem Relation Age of Onset  . Diabetes Mother   . COPD Brother   . COPD Sister      History   Social History  . Marital Status: Married    Spouse Name: N/A    Number of Children: N/A  . Years of Education: N/A   Occupational History  . Not on file.   Social History Main Topics  . Smoking status: Former Smoker -- 2.0 packs/day for 40 years    Types: Cigarettes, Pipe    Quit date: 06/09/1995  . Smokeless tobacco: Never Used  . Alcohol Use: No  . Drug Use: No  . Sexually Active: Not on file   Other Topics Concern  . Not on file   Social History Narrative  . No narrative on file     Allergies  Allergen Reactions  . Atorvastatin      Outpatient Prescriptions Prior to Visit  Medication Sig Dispense Refill  . allopurinol (ZYLOPRIM) 300 MG tablet Take 300 mg by mouth daily.        [name] aspirin 81 MG tablet Take 81 mg by mouth daily.         [name] guaiFENesin (MUCINEX) 600 MG 12 hr tablet Take 600 mg by mouth 2 (two) times daily as needed.       . meloxicam (MOBIC) 15 MG tablet Take 15 mg by mouth daily.       . Multiple Vitamin (MULTIVITAMIN) capsule Take 1 capsule by mouth daily.        [name] omeprazole (PRILOSEC) 20 MG capsule Take 20 mg by mouth daily.        [name] POTASSIUM PO Take 1 tablet by mouth daily. OTC -- unsure of the strength      . [DISCONTINUED] ADVAIR DISKUS 250-50 MCG/DOSE AEPB inhale 1 dose by mouth twice a day  60 each  5  . [DISCONTINUED] DALIRESP 500 MCG TABS tablet take 1 tablet by mouth once daily  30 tablet  1  Last reviewed on 07/05/2012  9:12 AM by Storm Frisk, MD   Review of Systems  Constitutional:   No  weight loss, night sweats,  Fevers, chills, fatigue, lassitude. HEENT:   No headaches,  Difficulty swallowing,  Tooth/dental problems,  Sore throat,                No sneezing,  itching, ear ache, nasal congestion, post nasal drip,   CV:  No chest pain,  Orthopnea, PND, swelling in lower extremities, anasarca, dizziness, palpitations  GI  No heartburn, indigestion, abdominal pain, nausea, vomiting, diarrhea, change in bowel habits, loss of appetite  Resp: Notes shortness of breath with exertion not at rest.  Notes excess mucus, notes productive cough,  No non-productive cough,  No coughing up of blood.  No change in color of mucus.  Notes  wheezing.  No chest wall deformity  Skin: no rash or lesions.  GU: no dysuria, change in color of urine, no urgency or frequency.  No flank pain.  MS:  No joint pain or swelling.  No decreased range of motion.  No back pain.  Psych:  No change in mood or affect. No depression or anxiety.  No memory loss.     Objective:   Physical Exam   Filed Vitals:   07/05/12 0902  BP: 138/64  Pulse: 72  Temp: 96.9 F (36.1 C)  TempSrc: Oral  Height: 6\' 1"  (1.854 m)  Weight: 208 lb (94.348 kg)  SpO2: 93%    Gen: Pleasant, well-nourished, in no distress,  normal  affect  ENT: No lesions,  mouth clear,  oropharynx clear, + postnasal drip  Neck: No JVD, no TMG, no carotid bruits  Lungs: No use of accessory muscles, no dullness to percussion, no wheeze   Cardiovascular: RRR, heart sounds normal, no murmur or gallops, no peripheral edema  Abdomen: soft and NT, no HSM,  BS normal  Musculoskeletal: No deformities, no cyanosis or clubbing  Neuro: alert, non focal  Skin: Warm, no lesions or rashes       Assessment & Plan:   COPD, Gold C  Gold stage C. COPD with frequent exacerbations now improved with a positive response to Daliresp Note current acute tonsillitis with left tonsillar abscess Plan Maintain Daliresp in inhaled medications  Begin Augmentin for 7 days later and 75 mg twice daily for tonsillar abscess Return 4 months    Updated Medication List Outpatient Encounter Prescriptions as of 07/05/2012  Medication Sig Dispense Refill  . allopurinol (ZYLOPRIM) 300 MG tablet Take 300 mg by mouth daily.        [name] aspirin 81 MG tablet Take 81 mg by mouth daily.        . Fluticasone-Salmeterol (ADVAIR DISKUS) 250-50 MCG/DOSE AEPB Inhale 1 puff into the lungs 2 (two) times daily.  60 each  11  . guaiFENesin (MUCINEX) 600 MG 12 hr tablet Take 600 mg by mouth 2 (two) times daily as needed.       . meloxicam (MOBIC) 15 MG tablet Take 15 mg by mouth daily.       . Multiple Vitamin (MULTIVITAMIN) capsule Take 1 capsule by mouth daily.        [name] omeprazole (PRILOSEC) 20 MG capsule Take 20 mg by mouth daily.        [name] POTASSIUM PO Take 1 tablet by mouth daily. OTC -- unsure of the strength      . roflumilast (DALIRESP) 500 MCG TABS tablet Take 1 tablet (500 mcg total) by mouth daily.  30 tablet  11  . [DISCONTINUED] ADVAIR DISKUS 250-50 MCG/DOSE AEPB inhale 1 dose by mouth twice a day  60 each  5  . [DISCONTINUED] DALIRESP 500 MCG TABS tablet take 1 tablet by mouth once daily  30 tablet  1  . amoxicillin-clavulanate (AUGMENTIN) 875-125 MG per tablet  Take 1 tablet by mouth  2 (two) times daily.  14 tablet  0

## 2012-07-05 NOTE — Patient Instructions (Addendum)
Augmentin 875 twice daily for 7days for tonsilitis Refills sent on advair/daliresp Return 4 months

## 2012-07-05 NOTE — Assessment & Plan Note (Signed)
Gold stage C. COPD with frequent exacerbations now improved with a positive response to Daliresp Note current acute tonsillitis with left tonsillar abscess Plan Maintain Daliresp in inhaled medications  Begin Augmentin for 7 days later and 75 mg twice daily for tonsillar abscess Return 4 months

## 2012-07-11 ENCOUNTER — Other Ambulatory Visit: Payer: Self-pay | Admitting: Urology

## 2012-07-11 DIAGNOSIS — C649 Malignant neoplasm of unspecified kidney, except renal pelvis: Secondary | ICD-10-CM

## 2012-07-11 DIAGNOSIS — N400 Enlarged prostate without lower urinary tract symptoms: Secondary | ICD-10-CM

## 2012-07-14 ENCOUNTER — Ambulatory Visit
Admission: RE | Admit: 2012-07-14 | Discharge: 2012-07-14 | Disposition: A | Discharge status: Home / Self Care | Payer: Medicare Other | Source: Ambulatory Visit | Attending: Urology | Admitting: Urology

## 2012-07-14 DIAGNOSIS — C649 Malignant neoplasm of unspecified kidney, except renal pelvis: Secondary | ICD-10-CM

## 2012-07-14 DIAGNOSIS — N400 Enlarged prostate without lower urinary tract symptoms: Secondary | ICD-10-CM

## 2012-07-14 MED ORDER — IOHEXOL 300 MG/ML  SOLN
100.0000 mL | Freq: Once | INTRAMUSCULAR | Status: AC | PRN
  Administered 2012-07-14: 100 mL via INTRAVENOUS

## 2012-09-13 ENCOUNTER — Other Ambulatory Visit: Payer: Self-pay | Admitting: Orthopedic Surgery

## 2012-10-07 ENCOUNTER — Encounter (HOSPITAL_COMMUNITY): Payer: Self-pay | Admitting: Pharmacy Technician

## 2012-10-12 ENCOUNTER — Ambulatory Visit (HOSPITAL_COMMUNITY)
Admission: RE | Admit: 2012-10-12 | Discharge: 2012-10-12 | Disposition: A | Discharge status: Home / Self Care | Payer: Medicare Other | Source: Ambulatory Visit | Attending: Orthopedic Surgery | Admitting: Orthopedic Surgery

## 2012-10-12 ENCOUNTER — Encounter (HOSPITAL_COMMUNITY): Payer: Self-pay

## 2012-10-12 ENCOUNTER — Encounter (HOSPITAL_COMMUNITY)
Admission: RE | Admit: 2012-10-12 | Discharge: 2012-10-12 | Disposition: A | Discharge status: Home / Self Care | Payer: Medicare Other | Source: Ambulatory Visit | Attending: Orthopedic Surgery | Admitting: Orthopedic Surgery

## 2012-10-12 DIAGNOSIS — J4489 Other specified chronic obstructive pulmonary disease: Secondary | ICD-10-CM | POA: Insufficient documentation

## 2012-10-12 DIAGNOSIS — Z01812 Encounter for preprocedural laboratory examination: Secondary | ICD-10-CM | POA: Insufficient documentation

## 2012-10-12 DIAGNOSIS — Z01818 Encounter for other preprocedural examination: Secondary | ICD-10-CM | POA: Insufficient documentation

## 2012-10-12 DIAGNOSIS — Z01810 Encounter for preprocedural cardiovascular examination: Secondary | ICD-10-CM | POA: Insufficient documentation

## 2012-10-12 DIAGNOSIS — J449 Chronic obstructive pulmonary disease, unspecified: Secondary | ICD-10-CM | POA: Insufficient documentation

## 2012-10-12 HISTORY — DX: Unspecified osteoarthritis, unspecified site: M19.90

## 2012-10-12 HISTORY — DX: Pain, unspecified: R52

## 2012-10-12 LAB — BASIC METABOLIC PANEL
CO2: 25 mEq/L (ref 19–32)
Chloride: 106 mEq/L (ref 96–112)
GFR calc Af Amer: 71 mL/min — ABNORMAL LOW (ref >90)
Potassium: 4.6 mEq/L (ref 3.5–5.1)

## 2012-10-12 LAB — CBC
Platelets: 174 10*3/uL (ref 150–400)
RBC: 4.53 MIL/uL (ref 4.22–5.81)
RDW: 15.3 % (ref 11.5–15.5)
WBC: 5.4 10*3/uL (ref 4.0–10.5)

## 2012-10-12 LAB — SURGICAL PCR SCREEN: Staphylococcus aureus: POSITIVE — AB

## 2012-10-12 LAB — APTT: aPTT: 34 seconds (ref 24–37)

## 2012-10-12 NOTE — H&P (Signed)
NAMESHERWOOD, Jerome Mays                ACCOUNT NO.:  [id number]  MEDICAL RECORD NO.:  [id number]  LOCATION:  [facility]                        FACILITY:  [facility]  PHYSICIAN:  Marlowe Kays, M.D.  DATE OF BIRTH:  01-04-1941  DATE OF ADMISSION:  10/19/2012 DATE OF DISCHARGE:                             HISTORY & PHYSICAL   CHIEF COMPLAINT:  "Pain in my left knee."  HISTORY OF PRESENT ILLNESS:  This 72 year old white male has been seen by [place] for continuing progressive problems concerning his left knee.  He has undergone right total knee replacement arthroplasty in the past.  He has done very well with that.  We have tried conservative measures including corticosteroid injections to his knees as well as viscosupplementation, which is providing him with some, but certainly not complete relief.  After much discussion including the risks and benefits of surgery, it was decided to go ahead with total knee replacement arthroplasty to the left knee.  He is very familiar with the perioperative course as he has had right knee done.  PAST MEDICAL HISTORY:  The patient's surgeries include nephrectomy on the right about 14 years ago with accompanying appendectomy.  Total knee replacement on the right in 2006, left knee scope, hand surgery, and he has had some back surgery successfully.  FAMILY HISTORY:  Positive for heart attack in the father, diabetes in the mother.  SOCIAL HISTORY:  The patient is married.  Retired.  Does not smoke now, but he has been smoking for 30 years, quit recently.  CURRENT MEDICATIONS:  Omeprazole 20 mg daily; allopurinol 300 mg daily; low-dose aspirin 81 mg daily; meloxicam 15 mg daily, we will stop prior to surgery; Advair 250/50 twice a day; __________ 500 mcg daily for leg cramps; and Mucinex.  REVIEW OF SYSTEMS:  HEENT:  The patient has impaired hearing. CARDIOVASCULAR:  No chest pain.  No angina.  No orthopnea. GASTROINTESTINAL:  The patient has reflux disease.   RESPIRATORY:  The patient has mild shortness of breath secondary to COPD and emphysema, which is also positive in his family.  GENITOURINARY:  No discharge, dysuria, or hematuria.  MUSCULOSKELETAL:  Primarily in present illness.  PHYSICAL EXAMINATION:  GENERAL:  Alert, cooperative friendly 72 year old white male who fully alert and oriented.  He is accompanied by his wife. VITAL SIGNS:  Blood pressure 147/80, pulse 80, respirations 12 and unlabored. HEENT:  Normocephalic.  PERRLA.  EOM intact.  Oropharynx is clear. CHEST:  Clear to auscultation.  No rhonchi.  No rales. HEART:  Regular rate and rhythm.  No murmurs are heard. ABDOMEN:  Soft, nontender.  Liver and spleen not felt. GENITALIA/RECTAL:  Not done.  Not pertinent to present illness. EXTREMITIES:  The patient has painful range of motion with crepitus to his left knee and slight valgus deformity.  ADMITTING DIAGNOSES: 1. End-stage osteoarthritis of left knee. 2. Gout. 3. Hypertension.  PLAN:  The patient will undergo a left total knee replacement arthroplasty.     Jerome Mays.   ______________________________ Marlowe Kays, M.D.    DLU/MEDQ  D:  10/11/2012  T:  10/12/2012  Job:  [id number]  cc:   [name], M.D. Fax: [phone]

## 2012-10-12 NOTE — Pre-Procedure Instructions (Signed)
EKG AND CXR WERE DONE TODAY PREOP AT [facility] AS PER ANESTHESIOLOGIST'S GUIDELINES. 

## 2012-10-12 NOTE — Patient Instructions (Signed)
YOUR SURGERY IS SCHEDULED AT [facility]  ON:   Wednesday  5/14  REPORT TO [facility] AT:  6:30 AM      PHONE # FOR SHORT STAY IS [phone]  DO NOT EAT OR DRINK ANYTHING AFTER MIDNIGHT THE NIGHT BEFORE YOUR SURGERY.  YOU MAY BRUSH YOUR TEETH, RINSE OUT YOUR MOUTH--BUT NO WATER, NO FOOD, NO CHEWING GUM, NO MINTS, NO CANDIES, NO CHEWING TOBACCO.  PLEASE TAKE THE FOLLOWING MEDICATIONS THE AM OF YOUR SURGERY WITH A FEW SIPS OF WATER:   ALLOPURINOL, OMEPRAZOLE, DALIRESP.  USE YOUR ADVAIR.    DO NOT BRING VALUABLES, MONEY, CREDIT CARDS.  DO NOT WEAR JEWELRY, MAKE-UP, NAIL POLISH AND NO METAL PINS OR CLIPS IN YOUR HAIR. CONTACT LENS, DENTURES / PARTIALS, GLASSES SHOULD NOT BE WORN TO SURGERY AND IN MOST CASES-HEARING AIDS WILL NEED TO BE REMOVED.  BRING YOUR GLASSES CASE, ANY EQUIPMENT NEEDED FOR YOUR CONTACT LENS. FOR PATIENTS ADMITTED TO THE HOSPITAL--CHECK OUT TIME THE DAY OF DISCHARGE IS 11:00 AM.  ALL INPATIENT ROOMS ARE PRIVATE - WITH BATHROOM, TELEPHONE, TELEVISION AND WIFI INTERNET.                              PLEASE READ OVER ANY  FACT SHEETS THAT YOU WERE GIVEN: MRSA INFORMATION, BLOOD TRANSFUSION INFORMATION  FAILURE TO FOLLOW THESE INSTRUCTIONS MAY RESULT IN THE CANCELLATION OF YOUR SURGERY.   PATIENT SIGNATURE_________________________________

## 2012-10-19 ENCOUNTER — Encounter (HOSPITAL_COMMUNITY): Payer: Self-pay | Admitting: *Deleted

## 2012-10-19 ENCOUNTER — Inpatient Hospital Stay (HOSPITAL_COMMUNITY): Payer: Medicare Other

## 2012-10-19 ENCOUNTER — Inpatient Hospital Stay (HOSPITAL_COMMUNITY): Payer: Medicare Other | Admitting: Anesthesiology

## 2012-10-19 ENCOUNTER — Encounter (HOSPITAL_COMMUNITY): Payer: Self-pay | Admitting: Anesthesiology

## 2012-10-19 ENCOUNTER — Encounter (HOSPITAL_COMMUNITY)
Admission: RE | Disposition: A | Discharge status: Home Health | Payer: Self-pay | Source: Ambulatory Visit | Attending: Orthopedic Surgery

## 2012-10-19 ENCOUNTER — Inpatient Hospital Stay (HOSPITAL_COMMUNITY)
Admission: RE | Admit: 2012-10-19 | Discharge: 2012-10-22 | DRG: 470 | Disposition: A | Discharge status: Home Health | Payer: Medicare Other | Source: Ambulatory Visit | Attending: Orthopedic Surgery | Admitting: Orthopedic Surgery

## 2012-10-19 DIAGNOSIS — J449 Chronic obstructive pulmonary disease, unspecified: Secondary | ICD-10-CM | POA: Diagnosis present

## 2012-10-19 DIAGNOSIS — K219 Gastro-esophageal reflux disease without esophagitis: Secondary | ICD-10-CM | POA: Diagnosis present

## 2012-10-19 DIAGNOSIS — Z9889 Other specified postprocedural states: Secondary | ICD-10-CM

## 2012-10-19 DIAGNOSIS — I10 Essential (primary) hypertension: Secondary | ICD-10-CM | POA: Diagnosis present

## 2012-10-19 DIAGNOSIS — M109 Gout, unspecified: Secondary | ICD-10-CM | POA: Diagnosis present

## 2012-10-19 DIAGNOSIS — M1710 Unilateral primary osteoarthritis, unspecified knee: Principal | ICD-10-CM | POA: Diagnosis present

## 2012-10-19 DIAGNOSIS — D62 Acute posthemorrhagic anemia: Secondary | ICD-10-CM | POA: Diagnosis not present

## 2012-10-19 DIAGNOSIS — J4489 Other specified chronic obstructive pulmonary disease: Secondary | ICD-10-CM | POA: Diagnosis present

## 2012-10-19 HISTORY — PX: TOTAL KNEE ARTHROPLASTY: SHX125

## 2012-10-19 LAB — TYPE AND SCREEN
ABO/RH(D): O POS
Antibody Screen: NEGATIVE

## 2012-10-19 SURGERY — ARTHROPLASTY, KNEE, TOTAL
Anesthesia: General | Site: Knee | Laterality: Left | Wound class: Clean

## 2012-10-19 MED ORDER — METOCLOPRAMIDE HCL 5 MG/ML IJ SOLN
5.0000 mg | Freq: Three times a day (TID) | INTRAMUSCULAR | Status: DC | PRN
  Administered 2012-10-19: 10 mg via INTRAVENOUS
  Filled 2012-10-19: qty 2

## 2012-10-19 MED ORDER — BUPIVACAINE-EPINEPHRINE PF 0.25-1:200000 % IJ SOLN
INTRAMUSCULAR | Status: DC | PRN
  Administered 2012-10-19: 13 mL

## 2012-10-19 MED ORDER — GLYCOPYRROLATE 0.2 MG/ML IJ SOLN
INTRAMUSCULAR | Status: DC | PRN
  Administered 2012-10-19: .5 mg via INTRAVENOUS

## 2012-10-19 MED ORDER — PROMETHAZINE HCL 25 MG/ML IJ SOLN: 6.2500 mg | INTRAMUSCULAR | Status: DC | PRN

## 2012-10-19 MED ORDER — RIVAROXABAN 10 MG PO TABS
10.0000 mg | ORAL_TABLET | Freq: Every day | ORAL | Status: DC
  Administered 2012-10-20 – 2012-10-22 (×3): 10 mg via ORAL
  Filled 2012-10-19 (×4): qty 1

## 2012-10-19 MED ORDER — ACETAMINOPHEN 650 MG RE SUPP: 650.0000 mg | Freq: Four times a day (QID) | RECTAL | Status: DC | PRN

## 2012-10-19 MED ORDER — PANTOPRAZOLE SODIUM 40 MG PO TBEC
40.0000 mg | DELAYED_RELEASE_TABLET | Freq: Every day | ORAL | Status: DC
  Administered 2012-10-20 – 2012-10-22 (×3): 40 mg via ORAL
  Filled 2012-10-19 (×3): qty 1

## 2012-10-19 MED ORDER — LIDOCAINE HCL (PF) 2 % IJ SOLN
INTRAMUSCULAR | Status: DC | PRN
  Administered 2012-10-19: 75 mg

## 2012-10-19 MED ORDER — NEOSTIGMINE METHYLSULFATE 1 MG/ML IJ SOLN
INTRAMUSCULAR | Status: DC | PRN
  Administered 2012-10-19: 3.5 mg via INTRAVENOUS

## 2012-10-19 MED ORDER — ONDANSETRON HCL 4 MG/2ML IJ SOLN: 4.0000 mg | Freq: Four times a day (QID) | INTRAMUSCULAR | Status: DC | PRN

## 2012-10-19 MED ORDER — MENTHOL 3 MG MT LOZG: 1.0000 | LOZENGE | OROMUCOSAL | Status: DC | PRN

## 2012-10-19 MED ORDER — FENTANYL CITRATE 0.05 MG/ML IJ SOLN
INTRAMUSCULAR | Status: DC | PRN
  Administered 2012-10-19: 50 ug via INTRAVENOUS
  Administered 2012-10-19: 100 ug via INTRAVENOUS
  Administered 2012-10-19 (×3): 50 ug via INTRAVENOUS
  Administered 2012-10-19: 100 ug via INTRAVENOUS
  Administered 2012-10-19: 50 ug via INTRAVENOUS

## 2012-10-19 MED ORDER — CEFAZOLIN SODIUM-DEXTROSE 2-3 GM-% IV SOLR
2.0000 g | Freq: Four times a day (QID) | INTRAVENOUS | Status: AC
  Administered 2012-10-19 (×2): 2 g via INTRAVENOUS
  Filled 2012-10-19 (×2): qty 50

## 2012-10-19 MED ORDER — POVIDONE-IODINE 7.5 % EX SOLN: Freq: Once | CUTANEOUS | Status: DC

## 2012-10-19 MED ORDER — HYDROMORPHONE HCL PF 1 MG/ML IJ SOLN
0.2500 mg | INTRAMUSCULAR | Status: DC | PRN
  Administered 2012-10-19 (×4): 0.5 mg via INTRAVENOUS

## 2012-10-19 MED ORDER — LACTATED RINGERS IV SOLN
INTRAVENOUS | Status: DC | PRN
  Administered 2012-10-19 (×2): via INTRAVENOUS

## 2012-10-19 MED ORDER — ROFLUMILAST 500 MCG PO TABS
500.0000 ug | ORAL_TABLET | Freq: Every day | ORAL | Status: DC
  Administered 2012-10-20 – 2012-10-22 (×3): 500 ug via ORAL
  Filled 2012-10-19 (×3): qty 1

## 2012-10-19 MED ORDER — ONDANSETRON HCL 4 MG/2ML IJ SOLN
INTRAMUSCULAR | Status: DC | PRN
  Administered 2012-10-19: 4 mg via INTRAVENOUS

## 2012-10-19 MED ORDER — CEFAZOLIN SODIUM-DEXTROSE 2-3 GM-% IV SOLR
2.0000 g | INTRAVENOUS | Status: AC
  Administered 2012-10-19: 2 g via INTRAVENOUS

## 2012-10-19 MED ORDER — ROCURONIUM BROMIDE 100 MG/10ML IV SOLN
INTRAVENOUS | Status: DC | PRN
  Administered 2012-10-19: 20 mg via INTRAVENOUS
  Administered 2012-10-19: 60 mg via INTRAVENOUS

## 2012-10-19 MED ORDER — PHENOL 1.4 % MT LIQD: 1.0000 | OROMUCOSAL | Status: DC | PRN

## 2012-10-19 MED ORDER — ACETAMINOPHEN 325 MG PO TABS: 650.0000 mg | ORAL_TABLET | Freq: Four times a day (QID) | ORAL | Status: DC | PRN

## 2012-10-19 MED ORDER — ONDANSETRON HCL 4 MG PO TABS: 4.0000 mg | ORAL_TABLET | Freq: Four times a day (QID) | ORAL | Status: DC | PRN

## 2012-10-19 MED ORDER — NALOXONE HCL 0.4 MG/ML IJ SOLN: 0.4000 mg | INTRAMUSCULAR | Status: DC | PRN

## 2012-10-19 MED ORDER — SODIUM CHLORIDE 0.9 % IR SOLN
Status: DC | PRN
  Administered 2012-10-19: 1000 mL

## 2012-10-19 MED ORDER — 0.9 % SODIUM CHLORIDE (POUR BTL) OPTIME
TOPICAL | Status: DC | PRN
  Administered 2012-10-19: 1000 mL

## 2012-10-19 MED ORDER — ACETAMINOPHEN 10 MG/ML IV SOLN
INTRAVENOUS | Status: DC | PRN
  Administered 2012-10-19: 1000 mg via INTRAVENOUS

## 2012-10-19 MED ORDER — SODIUM CHLORIDE 0.9 % IV SOLN
INTRAVENOUS | Status: DC
  Administered 2012-10-20 – 2012-10-21 (×2): via INTRAVENOUS

## 2012-10-19 MED ORDER — DIPHENHYDRAMINE HCL 50 MG/ML IJ SOLN: 12.5000 mg | Freq: Four times a day (QID) | INTRAMUSCULAR | Status: DC | PRN

## 2012-10-19 MED ORDER — ONDANSETRON HCL 4 MG/2ML IJ SOLN
4.0000 mg | Freq: Four times a day (QID) | INTRAMUSCULAR | Status: DC | PRN
  Administered 2012-10-19: 4 mg via INTRAVENOUS
  Filled 2012-10-19: qty 2

## 2012-10-19 MED ORDER — ALLOPURINOL 300 MG PO TABS
300.0000 mg | ORAL_TABLET | Freq: Every day | ORAL | Status: DC
  Administered 2012-10-20 – 2012-10-22 (×3): 300 mg via ORAL
  Filled 2012-10-19 (×3): qty 1

## 2012-10-19 MED ORDER — HYDROMORPHONE HCL PF 1 MG/ML IJ SOLN
INTRAMUSCULAR | Status: DC | PRN
  Administered 2012-10-19 (×4): 0.5 mg via INTRAVENOUS

## 2012-10-19 MED ORDER — DIPHENHYDRAMINE HCL 12.5 MG/5ML PO ELIX: 12.5000 mg | ORAL_SOLUTION | Freq: Four times a day (QID) | ORAL | Status: DC | PRN

## 2012-10-19 MED ORDER — LACTATED RINGERS IV SOLN: INTRAVENOUS | Status: DC

## 2012-10-19 MED ORDER — HYDROMORPHONE 0.3 MG/ML IV SOLN
INTRAVENOUS | Status: DC
  Administered 2012-10-19: 0.4 mg via INTRAVENOUS
  Administered 2012-10-19: 1.79 mg via INTRAVENOUS
  Administered 2012-10-19: 12:00:00 via INTRAVENOUS
  Administered 2012-10-20: 2.19 mg via INTRAVENOUS
  Administered 2012-10-20: 1.39 mg via INTRAVENOUS
  Administered 2012-10-20: 05:00:00 via INTRAVENOUS
  Administered 2012-10-20: 0.799 mg via INTRAVENOUS
  Administered 2012-10-20 (×2): 1.79 mg via INTRAVENOUS
  Administered 2012-10-21: 1.19 mg via INTRAVENOUS
  Administered 2012-10-21: 0.2 mg via INTRAVENOUS
  Administered 2012-10-21: 0.563 mg via INTRAVENOUS
  Filled 2012-10-19 (×2): qty 25

## 2012-10-19 MED ORDER — PROPOFOL 10 MG/ML IV BOLUS
INTRAVENOUS | Status: DC | PRN
Start: 2012-10-19 — End: 2012-10-19
  Administered 2012-10-19: 170 mg via INTRAVENOUS
  Administered 2012-10-19: 30 mg via INTRAVENOUS

## 2012-10-19 MED ORDER — SODIUM CHLORIDE 0.9 % IJ SOLN: 9.0000 mL | INTRAMUSCULAR | Status: DC | PRN

## 2012-10-19 MED ORDER — METOCLOPRAMIDE HCL 10 MG PO TABS: 5.0000 mg | ORAL_TABLET | Freq: Three times a day (TID) | ORAL | Status: DC | PRN

## 2012-10-19 SURGICAL SUPPLY — 52 items
BAG ZIPLOCK 12X15 (MISCELLANEOUS) ×2 IMPLANT
BANDAGE ELASTIC 4 VELCRO ST LF (GAUZE/BANDAGES/DRESSINGS) ×2 IMPLANT
BANDAGE ELASTIC 6 VELCRO ST LF (GAUZE/BANDAGES/DRESSINGS) ×2 IMPLANT
BANDAGE ESMARK 6X9 LF (GAUZE/BANDAGES/DRESSINGS) ×1 IMPLANT
BANDAGE GAUZE ELAST BULKY 4 IN (GAUZE/BANDAGES/DRESSINGS) ×2 IMPLANT
BLADE SAG 18X100X1.27 (BLADE) ×2 IMPLANT
BNDG ESMARK 6X9 LF (GAUZE/BANDAGES/DRESSINGS) ×2
CEMENT BONE 1-PACK (Cement) ×4 IMPLANT
CLOTH BEACON ORANGE TIMEOUT ST (SAFETY) ×2 IMPLANT
CONT SPECI 4OZ STER CLIK (MISCELLANEOUS) ×2 IMPLANT
CUFF TOURN SGL QUICK 34 (TOURNIQUET CUFF) ×1
CUFF TRNQT CYL 34X4X40X1 (TOURNIQUET CUFF) ×1 IMPLANT
DRAPE EXTREMITY T 121X128X90 (DRAPE) ×2 IMPLANT
DRAPE LG THREE QUARTER DISP (DRAPES) ×2 IMPLANT
DRAPE POUCH INSTRU U-SHP 10X18 (DRAPES) ×2 IMPLANT
DRAPE U-SHAPE 47X51 STRL (DRAPES) ×2 IMPLANT
DRSG ADAPTIC 3X8 NADH LF (GAUZE/BANDAGES/DRESSINGS) ×2 IMPLANT
DRSG PAD ABDOMINAL 8X10 ST (GAUZE/BANDAGES/DRESSINGS) ×2 IMPLANT
ELECT BLADE TIP CTD 4 INCH (ELECTRODE) ×2 IMPLANT
ELECT REM PT RETURN 9FT ADLT (ELECTROSURGICAL) ×2
ELECTRODE REM PT RTRN 9FT ADLT (ELECTROSURGICAL) ×1 IMPLANT
EVACUATOR 1/8 PVC DRAIN (DRAIN) ×2 IMPLANT
FACESHIELD LNG OPTICON STERILE (SAFETY) ×12 IMPLANT
GLOVE ECLIPSE 8.0 STRL XLNG CF (GLOVE) ×4 IMPLANT
GLOVE INDICATOR 8.0 STRL GRN (GLOVE) ×4 IMPLANT
GOWN STRL REIN XL XLG (GOWN DISPOSABLE) ×4 IMPLANT
HANDPIECE INTERPULSE COAX TIP (DISPOSABLE) ×1
IMMOBILIZER KNEE 20 (SOFTGOODS) ×2
IMMOBILIZER KNEE 20 THIGH 36 (SOFTGOODS) ×1 IMPLANT
KIT BASIN OR (CUSTOM PROCEDURE TRAY) ×2 IMPLANT
MANIFOLD NEPTUNE II (INSTRUMENTS) ×2 IMPLANT
NEEDLE HYPO 22GX1.5 SAFETY (NEEDLE) ×2 IMPLANT
NS IRRIG 1000ML POUR BTL (IV SOLUTION) ×2 IMPLANT
PACK TOTAL JOINT (CUSTOM PROCEDURE TRAY) ×2 IMPLANT
POSITIONER SURGICAL ARM (MISCELLANEOUS) ×2 IMPLANT
SET HNDPC FAN SPRY TIP SCT (DISPOSABLE) ×1 IMPLANT
SPONGE GAUZE 4X4 12PLY (GAUZE/BANDAGES/DRESSINGS) ×2 IMPLANT
SPONGE LAP 18X18 X RAY DECT (DISPOSABLE) IMPLANT
SPONGE SURGIFOAM ABS GEL 100 (HEMOSTASIS) ×2 IMPLANT
STAPLER VISISTAT 35W (STAPLE) ×2 IMPLANT
STEM FLUTED REGULAR 18X80MM (Stem) ×2 IMPLANT
SUCTION FRAZIER 12FR DISP (SUCTIONS) ×2 IMPLANT
SUT BONE WAX W31G (SUTURE) ×2 IMPLANT
SUT VIC AB 1 CT1 27 (SUTURE) ×5
SUT VIC AB 1 CT1 27XBRD ANTBC (SUTURE) ×5 IMPLANT
SUT VIC AB 2-0 CT1 27 (SUTURE) ×2
SUT VIC AB 2-0 CT1 27XBRD (SUTURE) ×2 IMPLANT
SYR 20CC LL (SYRINGE) ×2 IMPLANT
TOWEL OR 17X26 10 PK STRL BLUE (TOWEL DISPOSABLE) ×4 IMPLANT
TOWER CARTRIDGE SMART MIX (DISPOSABLE) ×2 IMPLANT
WATER STERILE IRR 1500ML POUR (IV SOLUTION) ×4 IMPLANT
WRAP KNEE MAXI GEL POST OP (GAUZE/BANDAGES/DRESSINGS) ×2 IMPLANT

## 2012-10-19 NOTE — Anesthesia Postprocedure Evaluation (Signed)
Anesthesia Post Note  Patient: Jerome Mays  Procedure(s) Performed: Procedure(s) (LRB): LEFT TOTAL KNEE ARTHROPLASTY (Left)  Anesthesia type: General  Patient location: PACU  Post pain: Pain level controlled  Post assessment: Post-op Vital signs reviewed  Last Vitals:  Filed Vitals:   10/19/12 1240  BP: 135/74  Pulse: 65  Temp: 36.7 C  Resp:     Post vital signs: Reviewed  Level of consciousness: sedated  Complications: No apparent anesthesia complications

## 2012-10-19 NOTE — Consult Note (Signed)
  He has had no health change since H and P of 5/6 and is satisfactory for surgery today.

## 2012-10-19 NOTE — Anesthesia Preprocedure Evaluation (Signed)
Anesthesia Evaluation  Patient identified by MRN, date of birth, ID band Patient awake    Reviewed: Allergy & Precautions, H&P , NPO status , Patient's Chart, lab work & pertinent test results  Airway Mallampati: III TM Distance: >3 FB Neck ROM: Limited    Dental  (+) Teeth Intact and Dental Advisory Given   Pulmonary shortness of breath, COPDformer smoker,    + decreased breath sounds      Cardiovascular negative cardio ROS  Rhythm:Regular Rate:Normal     Neuro/Psych Numbness lower ext. bilateral negative neurological ROS  negative psych ROS   GI/Hepatic Neg liver ROS, GERD-  ,  Endo/Other  negative endocrine ROS  Renal/GU Renal diseasenegative Renal ROS  negative genitourinary   Musculoskeletal negative musculoskeletal ROS (+)   Abdominal   Peds  Hematology  (+) Blood dyscrasia, ,   Anesthesia Other Findings   Reproductive/Obstetrics negative OB ROS                           Anesthesia Physical Anesthesia Plan  ASA: III  Anesthesia Plan: General   Post-op Pain Management:    Induction:   Airway Management Planned: Oral ETT  Additional Equipment:   Intra-op Plan:   Post-operative Plan: Extubation in OR  Informed Consent: I have reviewed the patients History and Physical, chart, labs and discussed the procedure including the risks, benefits and alternatives for the proposed anesthesia with the patient or authorized representative who has indicated his/her understanding and acceptance.   Dental advisory given  Plan Discussed with: CRNA  Anesthesia Plan Comments:         Anesthesia Quick Evaluation

## 2012-10-19 NOTE — Progress Notes (Signed)
Utilization review completed.  

## 2012-10-19 NOTE — Evaluation (Signed)
Physical Therapy Evaluation Patient Details Name: Jerome Mays MRN: [id number] DOB: 01-07-1941 Today's Date: 10/19/2012 Time: 1324-4010 PT Time Calculation (min): 31 min  PT Assessment / Plan / Recommendation Clinical Impression  pt POD# 0 today, s/p LTKA and will benefit from PT to maximize independence for return home  with family;     PT Assessment  Patient needs continued PT services    Follow Up Recommendations  Home health PT;Supervision/Assistance - 24 hour    Does the patient have the potential to tolerate intense rehabilitation      Barriers to Discharge        Equipment Recommendations  Rolling walker with 5" wheels    Recommendations for Other Services     Frequency Min 3X/week    Precautions / Restrictions Precautions Precautions: Knee Required Braces or Orthoses: Knee Immobilizer - Left Restrictions Weight Bearing Restrictions: Yes LLE Weight Bearing: Partial weight bearing   Pertinent Vitals/Pain No c/o pain      Mobility  Bed Mobility Bed Mobility: Supine to Sit Supine to Sit: 4: Min assist Details for Bed Mobility Assistance: min with LLE Transfers Transfers: Sit to Stand;Stand to Sit Sit to Stand: 4: Min assist;From elevated surface Stand to Sit: 4: Min assist Details for Transfer Assistance: cues for hand placement and safety Ambulation/Gait Ambulation/Gait Assistance: 4: Min assist Ambulation Distance (Feet): 11 Feet Assistive device: Rolling walker Gait Pattern: Step-to pattern;Antalgic    Exercises Total Joint Exercises Ankle Circles/Pumps: AROM;Both;5 reps   PT Diagnosis: Difficulty walking  PT Problem List: Decreased strength;Decreased range of motion;Decreased balance;Decreased mobility;Decreased knowledge of use of DME;Decreased activity tolerance;Decreased knowledge of precautions PT Treatment Interventions: DME instruction;Gait training;Stair training;Functional mobility training;Therapeutic activities;Therapeutic  exercise;Patient/family education   PT Goals Acute Rehab PT Goals PT Goal Formulation: With patient Time For Goal Achievement: 10/26/12 Potential to Achieve Goals: Good Pt will go Supine/Side to Sit: with supervision PT Goal: Supine/Side to Sit - Progress: Goal set today Pt will go Sit to Stand: with supervision PT Goal: Sit to Stand - Progress: Goal set today Pt will go Stand to Sit: with supervision PT Goal: Stand to Sit - Progress: Goal set today Pt will Ambulate: 51 - 150 feet;with supervision PT Goal: Ambulate - Progress: Goal set today Pt will Go Up / Down Stairs: 3-5 stairs;with min assist;with rolling walker PT Goal: Up/Down Stairs - Progress: Goal set today Pt will Perform Home Exercise Program: with supervision, verbal cues required/provided PT Goal: Perform Home Exercise Program - Progress: Goal set today  Visit Information  Last PT Received On: 10/19/12 Assistance Needed: +2    Subjective Data  Subjective: i am nauseated Patient Stated Goal: home   Prior Functioning  Home Living Lives With: Spouse Available Help at Discharge: Family Type of Home: House Home Layout: Two level;Able to live on main level with bedroom/bathroom Alternate Level Stairs-Number of Steps: 3 Alternate Level Stairs-Rails: None Home Adaptive Equipment: None Prior Function Level of Independence: Independent Able to Take Stairs?: Yes Communication Communication: No difficulties    Cognition  Cognition Arousal/Alertness: Awake/alert Behavior During Therapy: WFL for tasks assessed/performed Overall Cognitive Status: Within Functional Limits for tasks assessed    Extremity/Trunk Assessment Right Upper Extremity Assessment RUE ROM/Strength/Tone: [facility] for tasks assessed Left Upper Extremity Assessment LUE ROM/Strength/Tone: WFL for tasks assessed Right Lower Extremity Assessment RLE ROM/Strength/Tone: [facility] for tasks assessed Left Lower Extremity Assessment LLE ROM/Strength/Tone:  Deficits;Due to pain LLE ROM/Strength/Tone Deficits: able to assist with SLR   Balance    End of  Session PT - End of Session Equipment Utilized During Treatment: Left knee immobilizer Activity Tolerance: Patient tolerated treatment well Patient left: in chair;with call bell/phone within reach;with family/visitor present Nurse Communication: Mobility status CPM Left Knee CPM Left Knee: On Left Knee Flexion (Degrees): 60 Left Knee Extension (Degrees): 0 Additional Comments: 6 to 8 hrs per day. increasse by 10 degrees per day  GP     [facility] 10/19/2012, 4:52 PM

## 2012-10-19 NOTE — Transfer of Care (Signed)
Immediate Anesthesia Transfer of Care Note  Patient: Jerome Mays  Procedure(s) Performed: Procedure(s): LEFT TOTAL KNEE ARTHROPLASTY (Left)  Patient Location: PACU  Anesthesia Type:General  Level of Consciousness: awake, oriented and patient cooperative  Airway & Oxygen Therapy: Patient Spontanous Breathing and Patient connected to face mask oxygen  Post-op Assessment: Report given to PACU RN, Post -op Vital signs reviewed and stable and Patient moving all extremities X 4  Post vital signs: Reviewed and stable  Complications: No apparent anesthesia complications

## 2012-10-20 ENCOUNTER — Encounter (HOSPITAL_COMMUNITY): Payer: Self-pay | Admitting: Orthopedic Surgery

## 2012-10-20 LAB — CBC
HCT: 31.8 % — ABNORMAL LOW (ref 39.0–52.0)
Hemoglobin: 9.9 g/dL — ABNORMAL LOW (ref 13.0–17.0)
MCH: 26.3 pg (ref 26.0–34.0)
MCHC: 31.1 g/dL (ref 30.0–36.0)

## 2012-10-20 MED ORDER — SALINE SPRAY 0.65 % NA SOLN
1.0000 | NASAL | Status: DC | PRN
  Filled 2012-10-20: qty 44

## 2012-10-20 MED ORDER — HYDROCODONE-ACETAMINOPHEN 10-325 MG PO TABS
1.0000 | ORAL_TABLET | ORAL | Status: DC | PRN
  Administered 2012-10-20 – 2012-10-21 (×5): 2 via ORAL
  Administered 2012-10-22 (×2): 1 via ORAL
  Filled 2012-10-20 (×3): qty 2
  Filled 2012-10-20: qty 1
  Filled 2012-10-20: qty 2
  Filled 2012-10-20: qty 1
  Filled 2012-10-20: qty 2

## 2012-10-20 MED ORDER — GUAIFENESIN ER 600 MG PO TB12
600.0000 mg | ORAL_TABLET | Freq: Two times a day (BID) | ORAL | Status: DC
  Administered 2012-10-20 – 2012-10-22 (×4): 600 mg via ORAL
  Filled 2012-10-20 (×5): qty 1

## 2012-10-20 NOTE — Progress Notes (Signed)
10/20/12 1400  PT Visit Information  Last PT Received On 10/20/12  PT Time Calculation  PT Start Time 1345  PT Stop Time 1411  PT Time Calculation (min) 26 min  Subjective Data  Subjective i don't feel good  Patient Stated Goal home  Precautions  Precautions Knee  Required Braces or Orthoses Knee Immobilizer - Left  Knee Immobilizer - Left Discontinue once straight leg raise with < 10 degree lag  Restrictions  LLE Weight Bearing PWB  Cognition  Arousal/Alertness Awake/alert  Behavior During Therapy WFL for tasks assessed/performed  Overall Cognitive Status Within Functional Limits for tasks assessed  Bed Mobility  Bed Mobility Sit to Supine  Sit to Supine 4: Min assist  Details for Bed Mobility Assistance min with LLE  Transfers  Transfers Sit to Stand;Stand to Sit  Sit to Stand 4: Min assist;From chair/3-in-1  Stand to Sit 4: Min assist  Details for Transfer Assistance cues for hand placement and extending leg  Ambulation/Gait  Ambulation/Gait Assistance 4: Min assist  Ambulation Distance (Feet) 35 Feet  Assistive device Rolling walker  Ambulation/Gait Assistance Details cues for sequence, step length, breathing  Gait Pattern Step-to pattern;Antalgic  Gait velocity decr  PT - End of Session  Equipment Utilized During Treatment Left knee immobilizer  Activity Tolerance Patient limited by fatigue;Patient limited by pain  Patient left in bed;with call bell/phone within reach;with family/visitor present  Nurse Communication Mobility status  PT - Assessment/Plan  Comments on Treatment Session pt not feeling well; has slight temp (100.9) and c/o pain even though premedicated and has PCA; encouraged use of IS and RN aware of temp  PT Plan Discharge plan remains appropriate;Frequency remains appropriate  PT Frequency 7X/week  Follow Up Recommendations Home health PT;Supervision - Intermittent  PT equipment Rolling walker with 5" wheels  Acute Rehab PT Goals  Time For Goal  Achievement 10/26/12  Potential to Achieve Goals Good  Pt will go Sit to Stand with supervision  PT Goal: Sit to Stand - Progress Progressing toward goal  Pt will go Stand to Sit with supervision  PT Goal: Stand to Sit - Progress Progressing toward goal  Pt will Ambulate 51 - 150 feet;with supervision;with rolling walker  PT Goal: Ambulate - Progress Progressing toward goal  Pt will Go Up / Down Stairs 3-5 stairs;with min assist;with rolling walker  Pt will Perform Home Exercise Program with supervision, verbal cues required/provided  PT General Charges  $$ ACUTE PT VISIT 1 Procedure  PT Treatments  $Gait Training 8-22 mins  $Therapeutic Activity 8-22 mins

## 2012-10-20 NOTE — Progress Notes (Signed)
Physical Therapy Treatment Patient Details Name: Jerome Mays MRN: [id number] DOB: 04/17/1941 Today's Date: 10/20/2012 Time: 0937-1006 PT Time Calculation (min): 29 min  PT Assessment / Plan / Recommendation Comments on Treatment Session  pt progressing; still with PCA, nausea better; will see again in pm    Follow Up Recommendations  Home health PT;Supervision - Intermittent     Does the patient have the potential to tolerate intense rehabilitation     Barriers to Discharge        Equipment Recommendations  Rolling walker with 5" wheels (3in1)    Recommendations for Other Services    Frequency 7X/week   Plan Discharge plan remains appropriate;Frequency remains appropriate    Precautions / Restrictions Precautions Precautions: Knee Required Braces or Orthoses: Knee Immobilizer - Left Knee Immobilizer - Left: Discontinue once straight leg raise with < 10 degree lag Restrictions Weight Bearing Restrictions: Yes LLE Weight Bearing: Partial weight bearing   Pertinent Vitals/Pain C/o left knee pain only with flexion 4/5    Mobility  Bed Mobility Bed Mobility: Supine to Sit Supine to Sit: 4: Min assist Details for Bed Mobility Assistance: min with LLE Transfers Sit to Stand: 4: Min assist;From elevated surface Details for Transfer Assistance: cues for hand placement and extending leg Ambulation/Gait Ambulation/Gait Assistance: 4: Min assist Ambulation Distance (Feet): 60 Feet Assistive device: Rolling walker Ambulation/Gait Assistance Details: cues for hand placement, LLE management and safety Gait Pattern: Step-to pattern;Antalgic    Exercises Total Joint Exercises Ankle Circles/Pumps: AROM;15 reps;Both Quad Sets: AROM;Both;10 reps Heel Slides: AAROM;Left;10 reps Hip ABduction/ADduction: AAROM;Left;10 reps Straight Leg Raises: AAROM;Left;10 reps Goniometric ROM: 45* L knee flexion   PT Diagnosis:    PT Problem List:   PT Treatment Interventions:     PT  Goals Acute Rehab PT Goals Time For Goal Achievement: 10/26/12 Potential to Achieve Goals: Good Pt will go Sit to Stand: with supervision PT Goal: Sit to Stand - Progress: Progressing toward goal Pt will go Stand to Sit: with supervision PT Goal: Stand to Sit - Progress: Progressing toward goal Pt will Ambulate: 51 - 150 feet;with supervision;with rolling walker PT Goal: Ambulate - Progress: Progressing toward goal Pt will Perform Home Exercise Program: with supervision, verbal cues required/provided PT Goal: Perform Home Exercise Program - Progress: Progressing toward goal  Visit Information  Last PT Received On: 10/20/12 Assistance Needed: +1    Subjective Data  Subjective: i got a little sleep Patient Stated Goal: home   Cognition  Cognition Arousal/Alertness: Awake/alert Behavior During Therapy: WFL for tasks assessed/performed Overall Cognitive Status: Within Functional Limits for tasks assessed    Balance     End of Session PT - End of Session Equipment Utilized During Treatment: Left knee immobilizer Activity Tolerance: Patient tolerated treatment well Patient left: in chair;with call bell/phone within reach;with family/visitor present   GP     [facility] 10/20/2012, 10:13 AM

## 2012-10-20 NOTE — Op Note (Signed)
Jerome Mays                ACCOUNT NO.:  [id number]  MEDICAL RECORD NO.:  [id number]  LOCATION:  1617                         FACILITY:  [facility]  PHYSICIAN:  Marlowe Kays, M.D.  DATE OF BIRTH:  Sep 11, 1940  DATE OF PROCEDURE:  10/19/2012 DATE OF DISCHARGE:                              OPERATIVE REPORT   PREOPERATIVE DIAGNOSIS:  End-stage osteoarthritis, left knee.  POSTOPERATIVE DIAGNOSIS:  End-stage osteoarthritis, left knee.  OPERATION:  Osteonics posterior stabilized total knee replacement, left.  SURGEON:  Marlowe Kays, MD.  ASSISTANTDruscilla Brownie. Cherlynn June.  ANESTHESIA:  General.  PATHOLOGY:  Tissue for procedure.  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  He has had good result from right total knee replacement that I did a number of years ago.  PROCEDURE IN DETAIL:  Prophylactic antibiotics, satisfied general anesthesia.  Pneumatic tourniquet to left lower extremity with a sure foot and lateral hip stabilizer.  The left leg was then prepped with DuraPrep from tourniquet to ankle and draped in sterile field.  Time-out performed.  Ioban employed, and the leg was Esmarch out sterilely and tourniquet was inflated to 325 mmHg.  Vertical midline incision down to the patellar mechanism with median parapatellar incision to open the joint.  He had large osteophytes around the femur and patella, which were excised.  The pace anserinus, I undermined and dissected off the proximal tibia while in the medial collateral ligament off the medial tibia.  The patella was everted, and the knee joint flexed.  Remnants of the menisci and ACL were removed.  I then made a 5/16-inch drill hole in the distal femur followed by the canal finder, and the axis liner set for 5 degree valgus cut to the left knee.  I elected to take 12 mm off the distal femur rather than the usual 10 because of its  flexion contracture.  We then sized the distal femur at #11.  I then used the Scribe lines on the distal femur and then using the distal femoral cutting jig, made the anterior-posterior cuts and posterior and anterior chamfer rings were.  I then went to the proximal tibia where I made a leveling cut and sized it at either 9 or 11.  Using the 11 base plate initially, I made my initial intramedullary drill hole followed by the canal finders step-cut drill, and then I used the internal aligning rod for cutting the proximal femur at 90 degrees.  I used a cut 4 mm off the depressed medial tibial plateau.  Then I returned to the femur and using the jig, performed a notchplasty, way to the patellar groove and then completed the notchplasty.  I then placed laminar spreader and removed remnants of bone and soft tissue from behind the femoral condyles.  I went through a trial reduction, found that a 10 mm spacer easily fit. Using it initially and using external aligning rod, I split the bimalleolar distance and marked using the tibial base plate, Scribe lines on the anterior tibia.  While the knee was in extension, I then measured the patella at 28, and we used  the 10-mm recess patellar jig to cut the 10-mm recess followed by the jig to create the 3 fixation holes. I used a trial of 28 patella and trimmed around the patella removing excess bone and soft tissue.  I then removed the temporary components and sized the tibia, once again at this time 9 was a better fit, and we used the 9 base plate stabilizing it to the cut tibia with 3 pins and then used the tripod apparatus to ream for the tibial keel up to a 9 cemented.  I followed this with the hand reaming for an extension which went up to an 18 mm x 80 mm.  I felt the extension was necessary because of the instability of the knee.  We had used this successfully in his right knee.  After reaming for it, I went through a trial reduction and found  that the tibial component fit nicely.  The components were then opened and assembled while we water picked the knee.  The methyl methacrylate was then mixed, and I individually glued in the components starting first with the tibia, the methyl methacrylate only around the parent portion.  Excess methyl methacrylate was removed.  We then glued in the femur impacting it and held the knee in extension with a 10 mm spacer while we glued in the patella using patellar holding clamp.  When the methacrylate had hardened, we removed small particular portions of methacrylate in the joint, and I then went through another trial reduction and found that a 12-mm spacer seemed to be a better fit.  The knee went into full extension and was stable.  Accordingly, we went ahead and placed the final 12 mm posterior stabilized spacer.  I performed lateral release, and then closed the wound over a Hemovac. Prior to closure, I placed Gelfoam in the distal femoral hole and also injected the soft tissues with 0.25% Marcaine with adrenaline.  The wound was then closed in layers with 2 layers of #1 Vicryl in the quadriceps tendon distally in the synovium and capsule and combination of #1 and 2-0 Vicryl subcutaneous tissue.  The tourniquet was released after 2 hours of tourniquet time just prior to the closure.  We put in the skin staples.  Betadine Adaptic dry sterile dressing, followed by a knee immobilizer were applied.  He tolerated the procedure well, was taken to recovery room in satisfied condition with no known complications and minimal blood loss.  [name] assistance was necessary because of the complexity of the operation.          ______________________________ Marlowe Kays, M.D.     JA/MEDQ  D:  10/19/2012  T:  10/20/2012  Job:  [id number]

## 2012-10-20 NOTE — Care Management Note (Signed)
  Page 2 of 2   10/20/2012     11:53:22 AM   CARE MANAGEMENT NOTE 10/20/2012  Patient:  Jerome Mays, Jerome Mays   Account Number:  [id number]  Date Initiated:  10/20/2012  Documentation initiated by:  Colleen Can  Subjective/Objective Assessment:   Osteoarthritis left knee; total knee replacemnt     Action/Plan:   CM spoke with patient and spouse. Plans are for patient to return to his home in [place] where spouse will be caregiver. States he lives in one level home with handicapped equipped bathroom. States he will need RW. Wants [facility].   Anticipated DC Date:  10/22/2012   Anticipated DC Plan:  HOME W HOME HEALTH SERVICES  In-house referral  Clinical Social Worker      DC Planning Services  CM consult      [facility]  HOME HEALTH  DURABLE MEDICAL EQUIPMENT   Choice offered to / List presented to:  C-1 Patient   DME arranged  [facility]an Hurst      DME agency  [facility]     HH arranged  HH-2 PT      [facility] agency  [facility]   Status of service:  In process, will continue to follow Medicare Important Message given?  NA - LOS <3 / Initial given by admissions (If response is "NO", the following Medicare IM given date fields will be blank) Date Medicare IM given:   Date Additional Medicare IM given:    Discharge Disposition:    Per UR Regulation:    If discussed at Long Length of Stay Meetings, dates discussed:    Comments:  10/20/2012 Colleen Can BSN RN CCM [phone] [facility] notified of request of services. Advised by [facility] rep that they can provide [facility] services. [facility] rep notified of dme need.

## 2012-10-20 NOTE — Progress Notes (Signed)
Patient ID: Jerome Mays, male   DOB: April 24, 1941, 72 y.o.   MRN: [id number] POD 1--afebrile.  Hgb--9.9.  Moves foot well.  Post-op xray good.  Hemovac out today.

## 2012-10-20 NOTE — Progress Notes (Signed)
Received orders for rw. Will be delivered to hospital room prior to d/c.

## 2012-10-20 NOTE — Progress Notes (Signed)
CSW consulted for SNF placement. PN reviewed. Pt plans to return home. PT recommends HHPT following hospital d/c.  RNCM will assist with d/c planning. CSW is available to assist if plan changes and SNF placement is needed.  Cori Razor LCSW [phone]

## 2012-10-20 NOTE — Plan of Care (Signed)
Problem: Consults Goal: Diagnosis- Total Joint Replacement Outcome: Completed/Met Date Met:  10/20/12 Primary Total Knee LEFT

## 2012-10-20 NOTE — Evaluation (Signed)
Occupational Therapy Evaluation Patient Details Name: Jerome Mays MRN: [id number] DOB: [birth year] Today's Date: 10/20/2012 Time: 6440-3474 OT Time Calculation (min): 27 min  OT Assessment / Plan / Recommendation Clinical Impression  This 72 year old man was admitted for L TKA.  He is PWB.  Pt will benefit from skilled OT to educate on bathroom transfers and assess for DME.  Wife will assist with adls.      OT Assessment  Patient needs continued OT Services    Follow Up Recommendations  No OT follow up    Barriers to Discharge      Equipment Recommendations   (possibly none--will further assess tomorrow (OT))    Recommendations for Other Services    Frequency  Min 2X/week    Precautions / Restrictions Precautions Precautions: Knee Required Braces or Orthoses: Knee Immobilizer - Left Restrictions Weight Bearing Restrictions: Yes LLE Weight Bearing: Partial weight bearing   Pertinent Vitals/Pain Min pain in bed; 9./10 with weightbearing.  Pt used PCA, repositioned and ice applied    ADL  Grooming: Set up;Teeth care;Wash/dry face Where Assessed - Grooming: Supported sitting Upper Body Bathing: Set up Where Assessed - Upper Body Bathing: Unsupported sitting Lower Body Bathing: Moderate assistance Where Assessed - Lower Body Bathing: Supported sit to stand Upper Body Dressing: Minimal assistance (lines) Where Assessed - Upper Body Dressing: Unsupported sitting Lower Body Dressing: Maximal assistance Where Assessed - Lower Body Dressing: Supported sit to stand Toilet Transfer: Simulated;Minimal assistance Toilet Transfer Method: Stand pivot (bed to chair) Toileting - Clothing Manipulation and Hygiene: Minimal assistance Where Assessed - Glass blower/designer Manipulation and Hygiene: Standing Equipment Used: Gait belt;Rolling walker Transfers/Ambulation Related to ADLs: spt to recliner.  ADL Comments: Pt stood to use urinal.  Observed PWB.  Pain in weightbearing:  will  defer bathroom transfers until tomorrow wife will assist with adls.     OT Diagnosis: Generalized weakness  OT Problem List: Pain;Decreased activity tolerance;Decreased knowledge of use of DME or AE;Decreased strength OT Treatment Interventions: Self-care/ADL training;DME and/or AE instruction;Patient/family education   OT Goals Acute Rehab OT Goals OT Goal Formulation: With patient/family Time For Goal Achievement: 10/27/12 Potential to Achieve Goals: Good ADL Goals Pt Will Transfer to Toilet: with min assist;Ambulation;Comfort height toilet;Grab bars (min guard) and complete hygiene with supervision in standing ADL Goal: Toilet Transfer - Progress: Goal set today Pt Will Perform Tub/Shower Transfer: Shower transfer;with min assist;Ambulation (min guard) ADL Goal: Tub/Shower Transfer - Progress: Goal set today  Visit Information  Last OT Received On: 10/20/12 Assistance Needed: +2    Subjective Data  Subjective: I had my other knee done 5 years ago Patient Stated Goal: none stated   Prior Functioning     Home Living Lives With: Spouse Bathroom Shower/Tub: Health visitor: Handicapped height (with grab bar) Prior Function Level of Independence: Independent Communication Communication: No difficulties         Vision/Perception     Cognition  Cognition Arousal/Alertness: Awake/alert Behavior During Therapy: WFL for tasks assessed/performed Overall Cognitive Status: Within Functional Limits for tasks assessed    Extremity/Trunk Assessment Right Upper Extremity Assessment RUE ROM/Strength/Tone: WFL for tasks assessed Left Upper Extremity Assessment LUE ROM/Strength/Tone: WFL for tasks assessed     Mobility Bed Mobility Bed Mobility: Supine to Sit Supine to Sit: 4: Min assist Details for Bed Mobility Assistance: min with LLE Transfers Sit to Stand: 4: Min assist;From elevated surface Details for Transfer Assistance: cues for hand placement and  extending leg  Exercise     Balance     End of Session OT - End of Session Activity Tolerance: Patient limited by pain Patient left: in chair;with call bell/phone within reach;with family/visitor present  GO     Tzipporah Nagorski 10/20/2012, 10:03 AM Marica Otter, OTR/L [phone] 10/20/2012

## 2012-10-21 LAB — CBC
MCH: 28 pg (ref 26.0–34.0)
MCHC: 33.3 g/dL (ref 30.0–36.0)
MCV: 84 fL (ref 78.0–100.0)
Platelets: 130 10*3/uL — ABNORMAL LOW (ref 150–400)
RDW: 15.3 % (ref 11.5–15.5)
WBC: 10.7 10*3/uL — ABNORMAL HIGH (ref 4.0–10.5)

## 2012-10-21 NOTE — Clinical Documentation Improvement (Signed)
Anemia Blood Loss Clarification  THIS DOCUMENT IS NOT A PERMANENT PART OF THE MEDICAL RECORD  RESPOND TO THE THIS QUERY, FOLLOW THE INSTRUCTIONS BELOW:  1. If needed, update documentation for the patient's encounter via the notes activity.  2. Access this query again and click edit on the In [employer].  3. After updating, or not, click F2 to complete all highlighted (required) fields concerning your review. Select "additional documentation in the medical record" OR "no additional documentation provided".  4. Click Sign note button.  5. The deficiency will fall out of your In Basket Please let us know if you are not able to complete this workflow by phone or e-mail (listed below).        10/21/12  Dear Dr. Simonne Come, [name] Jerome Mays  In an effort to better capture your patient's severity of illness, reflect appropriate length of stay and utilization of resources, a review of the patient medical record has revealed the following indicators.    Based on your clinical judgment, please clarify and document in a progress note and/or discharge summary the clinical condition associated with the following supporting information:  In responding to this query please exercise your independent judgment.  The fact that a query is asked, does not imply that any particular answer is desired or expected.  Pt with post op H/H=8.6/25.8  Clarification Needed   Please clarify if Post Op H/H can be further specified as one of the diagnoses listed below and document in pn or d/c summary.     Possible Clinical Conditions?   " Expected Acute Blood Loss Anemia  " Acute Blood Loss Anemia  " Acute on chronic blood loss anemia   " Other Condition________________  " Cannot Clinically Determine  Risk Factors: (recent surgery, pre op anemia, EBL in OR)  Supporting Information:  Signs and Symptoms  Osteoarthrosis, Gout, HTN, TKA  Diagnostics: Component     Latest Ref Rng 10/20/2012 10/21/2012   Hemoglobin     13.0 - 17.0 g/dL 9.9 (L) 8.6 (L)  HCT     39.0 - 52.0 % 31.8 (L) 25.8 (L)   Treatments: Monitoring  Reviewed: additional documentation in the medical record ljh  Thank You,  Enis Slipper  RN, BSN, MSN/Inf, CCDS Clinical Documentation Specialist [facility] Dept Pager: [phone] / E-mail: [email or web address]  Health Information Management [place]    His anemia was due to post op bleeding

## 2012-10-21 NOTE — Progress Notes (Signed)
Patient ID: [name], male   DOB: 04/28/1941, 72 y.o.   MRN: [id number] POD 2.  He still needs PCA part of the time.  Hgb is 8.6.  Dressing changed--incision clean and dry.  Probably needs another day or so here.

## 2012-10-21 NOTE — Progress Notes (Signed)
Physical Therapy Treatment Patient Details Name: Jerome Mays MRN: [id number] DOB: 09/16/1940 Today's Date: 10/21/2012 Time: 7829-5621 PT Time Calculation (min): 24 min  PT Assessment / Plan / Recommendation Comments on Treatment Session  pt feeling better; Hgb down to 8.6 today, asymptomatic during therapy; Increased amb distance, min c/o Left knee pain    Follow Up Recommendations  Home health PT;Supervision - Intermittent     Does the patient have the potential to tolerate intense rehabilitation     Barriers to Discharge        Equipment Recommendations  Rolling walker with 5" wheels    Recommendations for Other Services    Frequency 7X/week   Plan Discharge plan remains appropriate;Frequency remains appropriate    Precautions / Restrictions Precautions Precautions: Knee Required Braces or Orthoses: Knee Immobilizer - Left Knee Immobilizer - Left: Discontinue once straight leg raise with < 10 degree lag Restrictions Weight Bearing Restrictions: Yes LLE Weight Bearing: Partial weight bearing   Pertinent Vitals/Pain Left knee 4/5    Mobility  Bed Mobility Bed Mobility: Sit to Supine Supine to Sit: 4: Min assist Details for Bed Mobility Assistance: min with LLE Transfers Transfers: Sit to Stand;Stand to Sit Sit to Stand: 4: Min guard;5: Supervision;From bed Stand to Sit: 4: Min guard;5: Supervision;To chair/3-in-1 Details for Transfer Assistance: cues for hand placement and extending leg Ambulation/Gait Ambulation/Gait Assistance: 4: Min guard Ambulation Distance (Feet): 135 Feet Assistive device: Rolling walker Ambulation/Gait Assistance Details: cues for upward gaze Gait Pattern: Step-to pattern;Antalgic    Exercises     PT Diagnosis:    PT Problem List:   PT Treatment Interventions:     PT Goals Acute Rehab PT Goals Time For Goal Achievement: 10/26/12 Potential to Achieve Goals: Good Pt will go Supine/Side to Sit: with supervision PT Goal:  Supine/Side to Sit - Progress: Progressing toward goal Pt will go Sit to Stand: with supervision PT Goal: Sit to Stand - Progress: Progressing toward goal Pt will go Stand to Sit: with supervision PT Goal: Stand to Sit - Progress: Progressing toward goal Pt will Ambulate: 51 - 150 feet;with supervision;with rolling walker PT Goal: Ambulate - Progress: Progressing toward goal Pt will Go Up / Down Stairs: 3-5 stairs;with min assist;with least restrictive assistive device  Visit Information  Last PT Received On: 10/21/12 Assistance Needed: +1    Subjective Data  Subjective: I feel ok, I walked to the bathroom Patient Stated Goal: home   Cognition  Cognition Arousal/Alertness: Awake/alert Behavior During Therapy: WFL for tasks assessed/performed Overall Cognitive Status: Within Functional Limits for tasks assessed    Balance  Static Standing Balance Static Standing - Balance Support: Right upper extremity supported;No upper extremity supported;During functional activity Static Standing - Level of Assistance: 5: Stand by assistance  End of Session PT - End of Session Equipment Utilized During Treatment: Left knee immobilizer Activity Tolerance: Patient tolerated treatment well Patient left: with call bell/phone within reach;with family/visitor present;in chair CPM Left Knee CPM Left Knee: Off   GP     [facility] 10/21/2012, 11:31 AM

## 2012-10-21 NOTE — Progress Notes (Signed)
  CARE MANAGEMENT NOTE 10/21/2012  Patient:  Jerome Mays, Jerome Mays   Account Number:  [id number]  Date Initiated:  10/20/2012  Documentation initiated by:  Colleen Can  Subjective/Objective Assessment:   Osteoarthritis left knee; total knee replacemnt     Action/Plan:   CM spoke with patient and spouse. Plans are for patient to return to his home in [place] where spouse will be caregiver. States he lives in one level home with handicapped equipped bathroom. States he will need RW. Wants [facility].   Anticipated DC Date:  10/22/2012   Anticipated DC Plan:  HOME W HOME HEALTH SERVICES  In-house referral  Clinical Social Worker      DC Planning Services  CM consult      [facility] Choice  HOME HEALTH  DURABLE MEDICAL EQUIPMENT   Choice offered to / List presented to:  C-1 Patient   DME arranged  Levan Hurst      DME agency  [facility]     HH arranged  HH-2 PT      [facility] agency  [facility]   Status of service:  In process, will continue to follow Medicare Important Message given?  NA - LOS <3 / Initial given by admissions (If response is "NO", the following Medicare IM given date fields will be blank) Date Medicare IM given:   Date Additional Medicare IM given:    Discharge Disposition:    Per UR Regulation:    If discussed at Long Length of Stay Meetings, dates discussed:    Comments:  10/21/2012 Colleen Can BSN RN CC [phone] DME has been delivered to patient's room. [facility] will provide [facility] services day after pt is discharged.

## 2012-10-21 NOTE — Progress Notes (Signed)
Occupational Therapy Treatment Patient Details Name: Jerome Mays MRN: [id number] DOB: 1940-12-03 Today's Date: 10/21/2012 Time: 4098-1191 OT Time Calculation (min): 25 min  OT Assessment / Plan / Recommendation Comments on Treatment Session      Follow Up Recommendations  No OT follow up    Barriers to Discharge       Equipment Recommendations       Recommendations for Other Services    Frequency Min 2X/week   Plan      Precautions / Restrictions Precautions Required Braces or Orthoses: Knee Immobilizer - Left Knee Immobilizer - Left: Discontinue once straight leg raise with < 10 degree lag Restrictions Weight Bearing Restrictions: Yes LLE Weight Bearing: Partial weight bearing   Pertinent Vitals/Pain 7/10 with weight bearing.  Pt premedicated    ADL  Toilet Transfer: Performed;Minimal assistance Toilet Transfer Method: Sit to stand Toilet Transfer Equipment: Comfort height toilet;Grab bars Equipment Used: Gait belt;Rolling walker Transfers/Ambulation Related to ADLs: ambulated to bathroom; removed 02, sats 85-92%, cued for breathing.  Reviewed shower transfer but pt did not want to practice.   ADL Comments: Pt had poor night of sleep.  He is able to maintain PWB when ambulating.  Pt required min A with transfer with 1 grab bar and comfort height commode.  At home, he also has a sink next to toilet.  He likely will not need 3:1 over toilet.    OT Diagnosis:    OT Problem List:   OT Treatment Interventions:     OT Goals ADL Goals Pt Will Transfer to Toilet: with min assist;Ambulation;Comfort height toilet;Grab bars (min guard with sink simulated and grab bar) ADL Goal: Toilet Transfer - Progress: Updated due to goal met  Visit Information  Last OT Received On: 10/21/12 Assistance Needed: +1    Subjective Data      Prior Functioning       Cognition  Cognition Arousal/Alertness: Awake/alert Behavior During Therapy: WFL for tasks  assessed/performed Overall Cognitive Status: Within Functional Limits for tasks assessed    Mobility  Transfers Sit to Stand: 4: Min assist;4: Min guard;From bed;From toilet;With armrests;With upper extremity assist (min A from toilet; min guard from bed)    Exercises      Balance     End of Session OT - End of Session Activity Tolerance: Patient limited by fatigue Patient left: in bed;with call bell/phone within reach CPM Left Knee CPM Left Knee: Off  GO     Jerome Mays 10/21/2012, 10:28 AM Marica Otter, OTR/L [phone] 10/21/2012

## 2012-10-21 NOTE — Progress Notes (Signed)
10/21/12 1400  PT Visit Information  Last PT Received On 10/21/12  Assistance Needed +1  PT Time Calculation  PT Start Time 1320  PT Stop Time 1355  PT Time Calculation (min) 35 min  Precautions  Precautions Knee  Required Braces or Orthoses Knee Immobilizer - Left  Knee Immobilizer - Left Discontinue once straight leg raise with < 10 degree lag  Restrictions  LLE Weight Bearing PWB  Cognition  Arousal/Alertness Awake/alert  Behavior During Therapy WFL for tasks assessed/performed  Overall Cognitive Status Within Functional Limits for tasks assessed  Bed Mobility  Bed Mobility Sit to Supine  Sit to Supine 4: Min assist;4: Min guard  Details for Bed Mobility Assistance min with LLE  Transfers  Transfers Sit to Stand;Stand to Sit  Sit to Stand 4: Min guard;5: Supervision;From chair/3-in-1  Stand to Sit 4: Min guard;5: Supervision;To bed  Details for Transfer Assistance cues for hand placement and extending leg  Ambulation/Gait  Ambulation/Gait Assistance 4: Min guard;5: Supervision  Ambulation Distance (Feet) 120 Feet  Assistive device Rolling walker  Ambulation/Gait Assistance Details cues for RWposition  Gait Pattern Step-to pattern;Antalgic  Total Joint Exercises  Ankle Circles/Pumps AROM;15 reps;Both  Quad Sets AROM;Both;10 reps  Heel Slides AAROM;Left;10 reps  Hip ABduction/ADduction AAROM;Left;10 reps  Straight Leg Raises AAROM;Left;10 reps  Goniometric ROM 68*  PT - End of Session  Equipment Utilized During Treatment Left knee immobilizer  Activity Tolerance Patient tolerated treatment well  Patient left with call bell/phone within reach;with family/visitor present;in chair  Nurse Communication Mobility status  PT - Assessment/Plan  Comments on Treatment Session 3 steps prior to possible Saturday D/C  PT Plan Discharge plan remains appropriate;Frequency remains appropriate  PT Frequency 7X/week  Follow Up Recommendations Home health PT;Supervision - Intermittent   PT equipment Rolling walker with 5" wheels  Acute Rehab PT Goals  Time For Goal Achievement 10/26/12  Potential to Achieve Goals Good  Pt will go Supine/Side to Sit with supervision  Pt will go Sit to Stand with supervision  PT Goal: Sit to Stand - Progress Progressing toward goal  Pt will go Stand to Sit with supervision  PT Goal: Stand to Sit - Progress Progressing toward goal  Pt will Ambulate 51 - 150 feet;with supervision;with rolling walker  PT Goal: Ambulate - Progress Progressing toward goal  Pt will Go Up / Down Stairs 3-5 stairs;with min assist;with least restrictive assistive device  PT General Charges  $$ ACUTE PT VISIT 1 Procedure  PT Treatments  $Gait Training 8-22 mins  $Therapeutic Exercise 8-22 mins

## 2012-10-22 LAB — CBC
Hemoglobin: 7.9 g/dL — ABNORMAL LOW (ref 13.0–17.0)
MCHC: 32 g/dL (ref 30.0–36.0)
Platelets: 135 10*3/uL — ABNORMAL LOW (ref 150–400)
RBC: 2.99 MIL/uL — ABNORMAL LOW (ref 4.22–5.81)

## 2012-10-22 MED ORDER — RIVAROXABAN 10 MG PO TABS: 10.0000 mg | ORAL_TABLET | Freq: Every day | ORAL | Status: DC

## 2012-10-22 MED ORDER — FERROUS SULFATE 325 (65 FE) MG PO TABS: 325.0000 mg | ORAL_TABLET | Freq: Three times a day (TID) | ORAL | Status: DC

## 2012-10-22 MED ORDER — HYDROCODONE-ACETAMINOPHEN 10-325 MG PO TABS: 1.0000 | ORAL_TABLET | Freq: Four times a day (QID) | ORAL | Status: DC | PRN

## 2012-10-22 NOTE — Discharge Summary (Signed)
Physician Discharge Summary   Patient ID: Jerome Mays MRN: [id number] DOB/AGE: 07-16-1940 72 y.o.  Admit date: 10/19/2012 Discharge date: 10/22/2012  Primary Diagnosis: Left Knee OA Admission Diagnoses: Left Knee OA Past Medical History  Diagnosis Date  . Malignant tumor of kidney   . Renal calculus   . Colonic polyp   . COPD (chronic obstructive pulmonary disease)   . GERD (gastroesophageal reflux disease)   . Gout   . Shortness of breath   . Arthritis   . Pain     LOWER BACK AND LEFT HIP - HX OF PREVIOUS LUMBAR AND CERVICAL SURGERY--PT STATES HE HAS HAD NUMBNESS IN BOTH FEET SINCE HIS BACK SURGERY   Discharge Diagnoses:   Active Problems:   * No active hospital problems. *  Estimated body mass index is 26.92 kg/(m^2) as calculated from the following:   Height as of this encounter: 6\' 1"  (1.854 m).   Weight as of this encounter: 92.534 kg (204 lb).  Procedure:  Procedure(s) (LRB): LEFT TOTAL KNEE ARTHROPLASTY (Left)   Consults: N/A  HPI: Patient tolerated hospital stay well. See Hospital course. Laboratory Data: Admission on 10/19/2012, Discharged on 10/22/2012  Component Date Value Range Status  . WBC 10/20/2012 13.6* 4.0 - 10.5 K/uL Final  . RBC 10/20/2012 3.77* 4.22 - 5.81 MIL/uL Final  . Hemoglobin 10/20/2012 9.9* 13.0 - 17.0 g/dL Final  . HCT 57/84/6962 31.8* 39.0 - 52.0 % Final  . MCV 10/20/2012 84.4  78.0 - 100.0 fL Final  . MCH 10/20/2012 26.3  26.0 - 34.0 pg Final  . MCHC 10/20/2012 31.1  30.0 - 36.0 g/dL Final  . RDW 95/28/4132 15.4  11.5 - 15.5 % Final  . Platelets 10/20/2012 178  150 - 400 K/uL Final  . WBC 10/21/2012 10.7* 4.0 - 10.5 K/uL Final  . RBC 10/21/2012 3.07* 4.22 - 5.81 MIL/uL Final  . Hemoglobin 10/21/2012 8.6* 13.0 - 17.0 g/dL Final  . HCT 44/06/270 25.8* 39.0 - 52.0 % Final  . MCV 10/21/2012 84.0  78.0 - 100.0 fL Final  . MCH 10/21/2012 28.0  26.0 - 34.0 pg Final  . MCHC 10/21/2012 33.3  30.0 - 36.0 g/dL Final  . RDW  53/66/4403 15.3  11.5 - 15.5 % Final  . Platelets 10/21/2012 130* 150 - 400 K/uL Final   Comment: SPECIMEN CHECKED FOR CLOTS                          DELTA CHECK NOTED  . WBC 10/22/2012 9.8  4.0 - 10.5 K/uL Final  . RBC 10/22/2012 2.99* 4.22 - 5.81 MIL/uL Final  . Hemoglobin 10/22/2012 7.9* 13.0 - 17.0 g/dL Final  . HCT 47/42/5956 24.7* 39.0 - 52.0 % Final  . MCV 10/22/2012 82.6  78.0 - 100.0 fL Final  . MCH 10/22/2012 26.4  26.0 - 34.0 pg Final  . MCHC 10/22/2012 32.0  30.0 - 36.0 g/dL Final  . RDW 38/75/6433 15.1  11.5 - 15.5 % Final  . Platelets 10/22/2012 135* 150 - 400 K/uL Final  Hospital Outpatient Visit on 10/12/2012  Component Date Value Range Status  . MRSA, PCR 10/12/2012 NEGATIVE  NEGATIVE Final  . Staphylococcus aureus 10/12/2012 POSITIVE* NEGATIVE Final   Comment:                                 The Xpert SA Assay (FDA  approved for NASAL specimens                          in patients over 74 years of age),                          is one component of                          a comprehensive surveillance                          program.  Test performance has                          been validated by [employer] for patients greater                          than or equal to 77 year old.                          It is not intended                          to diagnose infection nor to                          guide or monitor treatment.  . WBC 10/12/2012 5.4  4.0 - 10.5 K/uL Final  . RBC 10/12/2012 4.53  4.22 - 5.81 MIL/uL Final  . Hemoglobin 10/12/2012 12.1* 13.0 - 17.0 g/dL Final  . HCT 09/81/1914 38.2* 39.0 - 52.0 % Final  . MCV 10/12/2012 84.3  78.0 - 100.0 fL Final  . MCH 10/12/2012 26.7  26.0 - 34.0 pg Final  . MCHC 10/12/2012 31.7  30.0 - 36.0 g/dL Final  . RDW 78/29/5621 15.3  11.5 - 15.5 % Final  . Platelets 10/12/2012 174  150 - 400 K/uL Final  . Sodium 10/12/2012 140  135 - 145 mEq/L Final  . Potassium  10/12/2012 4.6  3.5 - 5.1 mEq/L Final  . Chloride 10/12/2012 106  96 - 112 mEq/L Final  . CO2 10/12/2012 25  19 - 32 mEq/L Final  . Glucose, Bld 10/12/2012 122* 70 - 99 mg/dL Final  . BUN 30/86/5784 24* 6 - 23 mg/dL Final  . Creatinine, Ser 10/12/2012 1.15  0.50 - 1.35 mg/dL Final  . Calcium 69/62/9528 9.5  8.4 - 10.5 mg/dL Final  . GFR calc non Af Amer 10/12/2012 61* >90 mL/min Final  . GFR calc Af Amer 10/12/2012 71* >90 mL/min Final   Comment:                                 The eGFR has been calculated                          using the CKD EPI equation.  This calculation has not been                          validated in all clinical                          situations.                          eGFR's persistently                          <90 mL/min signify                          possible Chronic Kidney Disease.  [name] aPTT 10/12/2012 34  24 - 37 seconds Final  . ABO/RH(D) 10/12/2012 O POS   Final  . Antibody Screen 10/12/2012 NEG   Final  . Sample Expiration 10/12/2012 10/22/2012   Final  . Prothrombin Time 10/12/2012 13.4  11.6 - 15.2 seconds Final  . INR 10/12/2012 1.03  0.00 - 1.49 Final     X-Rays:Dg Chest 2 View  10/12/2012   *RADIOLOGY REPORT*  Clinical Data: Preop knee arthroplasty.  COPD.  CHEST - 2 VIEW  Comparison: 06/17/2010  Findings: COPD with pulmonary hyperinflation.  Negative for heart failure or pneumonia.  No pleural effusion or mass lesion is seen. No change from the prior study.  IMPRESSION: COPD without acute cardiopulmonary abnormality.   Original Report Authenticated By: Janeece Riggers, M.D.   Dg Knee 1-2 Views Left  10/19/2012   *RADIOLOGY REPORT*  Clinical Data: Status post knee replacement.  LEFT KNEE - 1-2 VIEW  Comparison: None.  Findings: The patient has a new left total knee arthroplasty. Surgical drain and staples are noted.  The device is located and there is no fracture.  Gas in the soft tissues is identified.  IMPRESSION: Left  total knee replacement without evidence of complication.   Original Report Authenticated By: Holley Dexter, M.D.    EKG: Orders placed during the hospital encounter of 10/12/12  . EKG 12-LEAD  . EKG 12-LEAD     Hospital Course: Jerome Mays is a 72 y.o. who was admitted to [facility]. They were brought to the operating room on 10/19/2012 and underwent Procedure(s): LEFT TOTAL KNEE ARTHROPLASTY.  Patient tolerated the procedure well and was later transferred to the recovery room and then to the orthopaedic floor for postoperative care.  They were given PO and IV analgesics for pain control following their surgery.  They were given 24 hours of postoperative antibiotics of  Anti-infectives   Start     Dose/Rate Route Frequency Ordered Stop   10/19/12 1500  ceFAZolin (ANCEF) IVPB 2 g/50 mL premix     2 g 100 mL/hr over 30 Minutes Intravenous Every 6 hours 10/19/12 1246 10/19/12 2211   10/19/12 0622  ceFAZolin (ANCEF) IVPB 2 g/50 mL premix     2 g 100 mL/hr over 30 Minutes Intravenous On call to O.R. 10/19/12 1610 10/19/12 0839     and started on DVT prophylaxis in the form of Xeralto.   PT and OT were ordered for total joint protocol.  Discharge planning consulted to help with postop disposition and equipment needs.  Patient had a good night on the evening of surgery.  They started to get up OOB with therapy on  day one.  Continued to work with therapy into day two.  Dressing was changed on day two and the incision was well approxamated.  By day three, the patient had progressed with therapy and meeting their goals.  Incision was healing well.  Patient was seen in rounds and was ready to go home.   Discharge Medications: Prior to Admission medications   Medication Sig Start Date End Date Taking? Authorizing Provider  allopurinol (ZYLOPRIM) 300 MG tablet Take 300 mg by mouth daily.    Yes Historical Provider, MD  Fluticasone-Salmeterol (ADVAIR) 250-50 MCG/DOSE AEPB Inhale 1 puff  into the lungs 2 (two) times daily. 07/05/12  Yes Storm Frisk, MD  Multiple Vitamin (MULTIVITAMIN WITH MINERALS) TABS Take 1 tablet by mouth daily.   Yes Historical Provider, MD  omeprazole (PRILOSEC) 20 MG capsule Take 20 mg by mouth daily.    Yes Historical Provider, MD  Pseudoephedrine-Guaifenesin [facility] D PO) Take by mouth. ONE A DAY IF NEEDED IF CHEST CONGESTION   Yes Historical Provider, MD  roflumilast (DALIRESP) 500 MCG TABS tablet Take 500 mcg by mouth daily. 07/05/12  Yes Storm Frisk, MD  ferrous sulfate (FERROUSUL) 325 (65 FE) MG tablet Take 1 tablet (325 mg total) by mouth 3 (three) times daily with meals. 10/22/12   Bryson L Stilwell, PA-C  HYDROcodone-acetaminophen (NORCO) 10-325 MG per tablet Take 1-2 tablets by mouth every 6 (six) hours as needed. 10/22/12   Bryson L Stilwell, PA-C  meloxicam (MOBIC) 15 MG tablet Take 15 mg by mouth daily.     Historical Provider, MD  rivaroxaban (XARELTO) 10 MG TABS tablet Take 1 tablet (10 mg total) by mouth daily with breakfast. 10/22/12   Markham Jordan, PA-C    Diet: Regular Activity:Weight bearing as tolerated. Follow-up:follow up in 2 weeks Disposition: Home  Discharged Condition: Stable   Discharge Orders   Future Appointments Provider Department Dept Phone   11/11/2012 3:00 PM Storm Frisk, MD [facility] [phone]   Future Orders Complete By Expires     Call MD / Call 911  As directed     Comments:      If you experience chest pain or shortness of breath, CALL 911 and be transported to the hospital emergency room.  If you develope a fever above 101 F, pus (white drainage) or increased drainage or redness at the wound, or calf pain, call your surgeon's office.    Change dressing  As directed     Comments:      Change dressing on sunday, then change the dressing daily with sterile 4 x 4 inch gauze dressing and apply TED hose.  You may clean the incision with alcohol prior to redressing.    Constipation  Prevention  As directed     Comments:      Drink plenty of fluids.  Prune juice may be helpful.  You may use a stool softener, such as Colace (over the counter) 100 mg twice a day.  Use MiraLax (over the counter) for constipation as needed.    DG Knee 1-2 Views Left  As directed 12/19/2013    Questions:      Reason for Exam (SYMPTOM  OR DIAGNOSIS REQUIRED):  post op total knee    Preferred imaging location?:  [facility]    Diet - low sodium heart healthy  As directed     Discharge instructions  As directed     Comments:      Call for  an office appointment [phone] in 2 weeks. Keep Dressing clean and Dry. Take medications as directed.    Do not put a pillow under the knee. Place it under the heel.  As directed     Increase activity slowly as tolerated  As directed     TED hose  As directed     Comments:      Use stockings (TED hose) for 2 weeks on bilateral leg(s).  You may remove them at night for sleeping.        Medication List    STOP taking these medications       aspirin 81 MG tablet     ICY HOT 5 % Pads  Generic drug:  Menthol (Topical Analgesic)      TAKE these medications       allopurinol 300 MG tablet  Commonly known as:  ZYLOPRIM  Take 300 mg by mouth daily.     ferrous sulfate 325 (65 FE) MG tablet  Commonly known as:  FERROUSUL  Take 1 tablet (325 mg total) by mouth 3 (three) times daily with meals.     Fluticasone-Salmeterol 250-50 MCG/DOSE Aepb  Commonly known as:  ADVAIR  Inhale 1 puff into the lungs 2 (two) times daily.     HYDROcodone-acetaminophen 10-325 MG per tablet  Commonly known as:  NORCO  Take 1-2 tablets by mouth every 6 (six) hours as needed.     meloxicam 15 MG tablet  Commonly known as:  MOBIC  Take 15 mg by mouth daily.     MUCINEX D PO  Take by mouth. ONE A DAY IF NEEDED IF CHEST CONGESTION     multivitamin with minerals Tabs  Take 1 tablet by mouth daily.     omeprazole 20 MG capsule  Commonly known as:  PRILOSEC    Take 20 mg by mouth daily.     rivaroxaban 10 MG Tabs tablet  Commonly known as:  XARELTO  Take 1 tablet (10 mg total) by mouth daily with breakfast.     roflumilast 500 MCG Tabs tablet  Commonly known as:  DALIRESP  Take 500 mcg by mouth daily.           Follow-up Information   Follow up with APLINGTON,JAMES P, MD. Call today. (2 weeks from surgery)    Contact information:   [address] [place] [address] [place] [address] [phone]       Signed: Markham Jordan 10/22/2012, 1:16 PM

## 2012-10-22 NOTE — Progress Notes (Signed)
Physical Therapy Treatment Patient Details Name: Jerome Mays MRN: [id number] DOB: 16-Oct-1940 Today's Date: 10/22/2012 Time: 0929-1010 PT Time Calculation (min): 41 min  PT Assessment / Plan / Recommendation Comments on Treatment Session  Wife assisting with stairs.  Reviewed don/doff KI with pt and spouse    Follow Up Recommendations  Home health PT;Supervision - Intermittent     Does the patient have the potential to tolerate intense rehabilitation     Barriers to Discharge        Equipment Recommendations  Rolling walker with 5" wheels    Recommendations for Other Services    Frequency 7X/week   Plan Discharge plan remains appropriate;Frequency remains appropriate    Precautions / Restrictions Precautions Precautions: Knee Required Braces or Orthoses: Knee Immobilizer - Left Knee Immobilizer - Left: Discontinue once straight leg raise with < 10 degree lag Restrictions Weight Bearing Restrictions: Yes LLE Weight Bearing: Partial weight bearing   Pertinent Vitals/Pain 4/10; premedicated, ice packs provided    Mobility  Bed Mobility Bed Mobility: Supine to Sit Supine to Sit: 4: Min assist Details for Bed Mobility Assistance: min with LLE Transfers Transfers: Sit to Stand;Stand to Sit Sit to Stand: 5: Supervision;With upper extremity assist;From bed Stand to Sit: 4: Min guard;5: Supervision;To chair/3-in-1;With armrests Details for Transfer Assistance: cues for hand placement and extending leg Ambulation/Gait Ambulation/Gait Assistance: 4: Min guard;5: Supervision Ambulation Distance (Feet): 123 Feet Assistive device: Rolling walker Ambulation/Gait Assistance Details: cues for posture, sequence, and position from RW Gait Pattern: Step-to pattern;Antalgic Gait velocity: decr Stairs: Yes Stairs Assistance: 4: Min assist Stairs Assistance Details (indicate cue type and reason): cues for sequence and foot/RW placement on stairs Stair Management Technique: No  rails;Step to pattern;Backwards;With walker Number of Stairs: 4    Exercises Total Joint Exercises Ankle Circles/Pumps: AROM;Both;20 reps Quad Sets: AROM;Both;20 reps Short Arc Quad: AAROM;10 reps;Supine;Both Heel Slides: AAROM;Left;20 reps Straight Leg Raises: AAROM;Left;20 reps Goniometric ROM: -10 - 60 AAROM    PT Diagnosis:    PT Problem List:   PT Treatment Interventions:     PT Goals Acute Rehab PT Goals PT Goal Formulation: With patient Time For Goal Achievement: 10/26/12 Potential to Achieve Goals: Good Pt will go Supine/Side to Sit: with supervision PT Goal: Supine/Side to Sit - Progress: Progressing toward goal Pt will go Sit to Stand: with supervision PT Goal: Sit to Stand - Progress: Progressing toward goal Pt will go Stand to Sit: with supervision PT Goal: Stand to Sit - Progress: Progressing toward goal Pt will Ambulate: 51 - 150 feet;with supervision;with rolling walker PT Goal: Ambulate - Progress: Progressing toward goal Pt will Go Up / Down Stairs: 3-5 stairs;with min assist;with least restrictive assistive device PT Goal: Up/Down Stairs - Progress: Met Pt will Perform Home Exercise Program: with supervision, verbal cues required/provided PT Goal: Perform Home Exercise Program - Progress: Progressing toward goal  Visit Information  Last PT Received On: 10/22/12 Assistance Needed: +1    Subjective Data  Subjective: I hope I'm going home today Patient Stated Goal: home   Cognition  Cognition Arousal/Alertness: Awake/alert Behavior During Therapy: WFL for tasks assessed/performed Overall Cognitive Status: Within Functional Limits for tasks assessed    Balance     End of Session PT - End of Session Equipment Utilized During Treatment: Left knee immobilizer Activity Tolerance: Patient tolerated treatment well Patient left: with call bell/phone within reach;with family/visitor present;in chair Nurse Communication: Mobility status CPM Left Knee CPM  Left Knee: Off   GP  Sunil Hue 10/22/2012, 11:46 AM

## 2012-10-22 NOTE — Care Management Note (Signed)
Pt discharged, Dme present in room. Dc & HH orders faxed to [facility] at [phone]. No other needs identified.   Roxy Manns Riddik Senna,RN,BSN [phone]

## 2012-10-22 NOTE — Progress Notes (Signed)
Subjective: 3 Days Post-Op Procedure(s) (LRB): LEFT TOTAL KNEE ARTHROPLASTY (Left) Patient reports pain as well controlled. Ready to Go home. Passing flatus. Progressing well with PT. Denies Sob, CP, or calf pain.    Objective: Vital signs in last 24 hours: Temp:  [98.8 F (37.1 C)-99 F (37.2 C)] 98.8 F (37.1 C) (05/17 0530) Pulse Rate:  [87-93] 91 (05/17 0530) Resp:  [15-18] 16 (05/17 0530) BP: (132-147)/(65-72) 147/72 mmHg (05/17 0530) SpO2:  [92 %-94 %] 93 % (05/17 0530)  Intake/Output from previous day: 05/16 0701 - 05/17 0700 In: 1080 [P.O.:480; I.V.:600] Out: 900 [Urine:900] Intake/Output this shift: Total I/O In: 120 [P.O.:120] Out: 175 [Urine:175]   Recent Labs  10/20/12 0403 10/21/12 0401 10/22/12 0523  HGB 9.9* 8.6* 7.9*    Recent Labs  10/21/12 0401 10/22/12 0523  WBC 10.7* 9.8  RBC 3.07* 2.99*  HCT 25.8* 24.7*  PLT 130* 135*   No results found for this basename: NA, K, CL, CO2, BUN, CREATININE, GLUCOSE, CALCIUM,  in the last 72 hours No results found for this basename: LABPT, INR,  in the last 72 hours  Well nourished. Alert and oriented. RRR, Lungs clear, BS positive x4, Calf soft and non-tender. Left LE neurovascularly intact. Dressing D/C/I.   Assessment/Plan: 3 Days Post-Op Procedure(s) (LRB): LEFT TOTAL KNEE ARTHROPLASTY (Left) D/c Home with family care. Follow d/c instruction. Take Medications as directed. Follow in office in 2 weeks.  Start PT Tomorrow.  Anemia:  Take PO IRON as directed.  STILWELL, BRYSON L 10/22/2012, 10:21 AM

## 2012-10-24 NOTE — Discharge Summary (Signed)
NAMEAVERI, CACIOPPO                ACCOUNT NO.:  [id number]  MEDICAL RECORD NO.:  [id number]  LOCATION:  1617                         FACILITY:  [facility]  PHYSICIAN:  Marlowe Kays, M.D.  DATE OF BIRTH:  [birth year]  DATE OF ADMISSION:  10/19/2012 DATE OF DISCHARGE:  10/22/2012                              DISCHARGE SUMMARY   ADDENDUM:  The anemia documented in his record was due to postoperative bleeding.  This was an acute blood-loss anemia therefore.          ______________________________ Marlowe Kays, M.D.     JA/MEDQ  D:  10/24/2012  T:  10/24/2012  Job:  [id number]

## 2012-10-26 NOTE — Discharge Summary (Signed)
NAMEKEI, MCELHINEY                ACCOUNT NO.:  [id number]  MEDICAL RECORD NO.:  [id number]  LOCATION:                                FACILITY:  [facility]  PHYSICIAN:  Marlowe Kays, M.D.  DATE OF BIRTH:  12/05/1940  DATE OF ADMISSION: DATE OF DISCHARGE:                              DISCHARGE SUMMARY   ADDENDUM:  DISCHARGE DIAGNOSIS:  Acute postoperative anemia.          ______________________________ Marlowe Kays, M.D.     JA/MEDQ  D:  10/26/2012  T:  10/26/2012  Job:  [id number]

## 2012-11-08 ENCOUNTER — Encounter: Payer: Self-pay | Admitting: Critical Care Medicine

## 2012-11-08 ENCOUNTER — Ambulatory Visit (INDEPENDENT_AMBULATORY_CARE_PROVIDER_SITE_OTHER): Payer: Medicare Other | Admitting: Critical Care Medicine

## 2012-11-08 ENCOUNTER — Ambulatory Visit (HOSPITAL_COMMUNITY)
Admission: RE | Admit: 2012-11-08 | Discharge: 2012-11-08 | Disposition: A | Discharge status: Home / Self Care | Payer: Medicare Other | Source: Ambulatory Visit | Attending: Orthopedic Surgery | Admitting: Orthopedic Surgery

## 2012-11-08 VITALS — BP 122/82 | HR 80 | Temp 97.8°F | Ht 73.0 in | Wt 197.8 lb

## 2012-11-08 DIAGNOSIS — M25669 Stiffness of unspecified knee, not elsewhere classified: Secondary | ICD-10-CM | POA: Insufficient documentation

## 2012-11-08 DIAGNOSIS — J441 Chronic obstructive pulmonary disease with (acute) exacerbation: Secondary | ICD-10-CM

## 2012-11-08 DIAGNOSIS — J449 Chronic obstructive pulmonary disease, unspecified: Secondary | ICD-10-CM | POA: Insufficient documentation

## 2012-11-08 DIAGNOSIS — J4489 Other specified chronic obstructive pulmonary disease: Secondary | ICD-10-CM | POA: Insufficient documentation

## 2012-11-08 DIAGNOSIS — IMO0001 Reserved for inherently not codable concepts without codable children: Secondary | ICD-10-CM | POA: Insufficient documentation

## 2012-11-08 DIAGNOSIS — R262 Difficulty in walking, not elsewhere classified: Secondary | ICD-10-CM | POA: Insufficient documentation

## 2012-11-08 DIAGNOSIS — M25662 Stiffness of left knee, not elsewhere classified: Secondary | ICD-10-CM | POA: Insufficient documentation

## 2012-11-08 MED ORDER — ROFLUMILAST 500 MCG PO TABS: 500.0000 ug | ORAL_TABLET | Freq: Every day | ORAL | Status: DC

## 2012-11-08 MED ORDER — AZITHROMYCIN 250 MG PO TABS: 250.0000 mg | ORAL_TABLET | Freq: Every day | ORAL | Status: DC

## 2012-11-08 MED ORDER — FLUTICASONE-SALMETEROL 250-50 MCG/DOSE IN AEPB: 1.0000 | INHALATION_SPRAY | Freq: Two times a day (BID) | RESPIRATORY_TRACT | Status: DC

## 2012-11-08 NOTE — Patient Instructions (Addendum)
Take azithromycin 250mg  Take two once then one daily until gone Refills on advair and daliresp sent to pharmacy Return 4 months

## 2012-11-08 NOTE — Progress Notes (Signed)
Subjective:    Patient ID: Jerome Mays, male    DOB: Nov 12, 1940, 72 y.o.   MRN: [id number]  HPI  72 y.o. WM with COPD /AB golds stage C  11/08/2012 Since last ov pt had L TKR 10/19/12 Pt had two weeks of home PT and now is outpt PT Dyspnea ok with this, notes sl more cough and is productive yellow - green.  Was brown after surgery Mouth is dry at night.  Throat is not now sore.  No real wheeze.  No chest pain noted   Past Medical History  Diagnosis Date  . Malignant tumor of kidney   . Renal calculus   . Colonic polyp   . COPD (chronic obstructive pulmonary disease)   . GERD (gastroesophageal reflux disease)   . Gout   . Shortness of breath   . Arthritis   . Pain     LOWER BACK AND LEFT HIP - HX OF PREVIOUS LUMBAR AND CERVICAL SURGERY--PT STATES HE HAS HAD NUMBNESS IN BOTH FEET SINCE HIS BACK SURGERY     Family History  Problem Relation Age of Onset  . Diabetes Mother   . COPD Brother   . COPD Sister      History   Social History  . Marital Status: Married    Spouse Name: N/A    Number of Children: N/A  . Years of Education: N/A   Occupational History  . Not on file.   Social History Main Topics  . Smoking status: Former Smoker -- 2.00 packs/day for 40 years    Types: Cigarettes, Pipe    Quit date: 06/09/1995  . Smokeless tobacco: Never Used  . Alcohol Use: No  . Drug Use: No  . Sexually Active: Not on file   Other Topics Concern  . Not on file   Social History Narrative  . No narrative on file     Allergies  Allergen Reactions  . Atorvastatin Rash and Other (See Comments)    Passed out     Outpatient Prescriptions Prior to Visit  Medication Sig Dispense Refill  . allopurinol (ZYLOPRIM) 300 MG tablet Take 300 mg by mouth daily.       . meloxicam (MOBIC) 15 MG tablet Take 15 mg by mouth daily.       . Multiple Vitamin (MULTIVITAMIN WITH MINERALS) TABS Take 1 tablet by mouth daily.      [name] omeprazole (PRILOSEC) 20 MG capsule Take 20 mg by mouth  daily.       . Fluticasone-Salmeterol (ADVAIR) 250-50 MCG/DOSE AEPB Inhale 1 puff into the lungs 2 (two) times daily.      . Pseudoephedrine-Guaifenesin (MUCINEX D PO) Take by mouth. ONE A DAY IF NEEDED IF CHEST CONGESTION      . roflumilast (DALIRESP) 500 MCG TABS tablet Take 500 mcg by mouth daily.      . ferrous sulfate (FERROUSUL) 325 (65 FE) MG tablet Take 1 tablet (325 mg total) by mouth 3 (three) times daily with meals.  90 tablet  0  . HYDROcodone-acetaminophen (NORCO) 10-325 MG per tablet Take 1-2 tablets by mouth every 6 (six) hours as needed.  50 tablet  0  . rivaroxaban (XARELTO) 10 MG TABS tablet Take 1 tablet (10 mg total) by mouth daily with breakfast.  14 tablet  0   No facility-administered medications prior to visit.     Review of Systems  Constitutional:   No  weight loss, night sweats,  Fevers, chills, fatigue, lassitude. HEENT:  No headaches,  Difficulty swallowing,  Tooth/dental problems,  Sore throat,                No sneezing, itching, ear ache, nasal congestion, post nasal drip,   CV:  No chest pain,  Orthopnea, PND, swelling in lower extremities, anasarca, dizziness, palpitations  GI  No heartburn, indigestion, abdominal pain, nausea, vomiting, diarrhea, change in bowel habits, loss of appetite  Resp: Notes shortness of breath with exertion not at rest.  Notes excess mucus, notes productive cough,  No non-productive cough,  No coughing up of blood.  No change in color of mucus.  Notes  wheezing.  No chest wall deformity  Skin: no rash or lesions.  GU: no dysuria, change in color of urine, no urgency or frequency.  No flank pain.  MS:  No joint pain or swelling.  No decreased range of motion.  No back pain.  Psych:  No change in mood or affect. No depression or anxiety.  No memory loss.     Objective:   Physical Exam   Filed Vitals:   11/08/12 1402  BP: 122/82  Pulse: 80  Temp: 97.8 F (36.6 C)  TempSrc: Oral  Height: 6\' 1"  (1.854 m)  Weight:  89.721 kg (197 lb 12.8 oz)  SpO2: 94%    Gen: Pleasant, well-nourished, in no distress,  normal affect  ENT: No lesions,  mouth clear,  oropharynx clear, + postnasal drip  Neck: No JVD, no TMG, no carotid bruits  Lungs: No use of accessory muscles, no dullness to percussion, exp  wheeze   Cardiovascular: RRR, heart sounds normal, no murmur or gallops, no peripheral edema  Abdomen: soft and NT, no HSM,  BS normal  Musculoskeletal: No deformities, no cyanosis or clubbing  Neuro: alert, non focal  Skin: Warm, no lesions or rashes       Assessment & Plan:   COPD, Gold C  Mild exac with gold C Copd Plan Azithromycin x 5days No change in inhaled meds     Updated Medication List Outpatient Encounter Prescriptions as of 11/08/2012  Medication Sig Dispense Refill  . allopurinol (ZYLOPRIM) 300 MG tablet Take 300 mg by mouth daily.       [name] aspirin 81 MG tablet Take 81 mg by mouth daily.      [name] dextromethorphan-guaiFENesin (MUCINEX DM) 30-600 MG per 12 hr tablet Take 1 tablet by mouth 2 (two) times daily as needed.      . Fluticasone-Salmeterol (ADVAIR) 250-50 MCG/DOSE AEPB Inhale 1 puff into the lungs 2 (two) times daily.  60 each  11  . meloxicam (MOBIC) 15 MG tablet Take 15 mg by mouth daily.       . Multiple Vitamin (MULTIVITAMIN WITH MINERALS) TABS Take 1 tablet by mouth daily.      [name] omeprazole (PRILOSEC) 20 MG capsule Take 20 mg by mouth daily.       . roflumilast (DALIRESP) 500 MCG TABS tablet Take 1 tablet (500 mcg total) by mouth daily.  30 tablet  11  . [DISCONTINUED] Fluticasone-Salmeterol (ADVAIR) 250-50 MCG/DOSE AEPB Inhale 1 puff into the lungs 2 (two) times daily.      . [DISCONTINUED] Pseudoephedrine-Guaifenesin (MUCINEX D PO) Take by mouth. ONE A DAY IF NEEDED IF CHEST CONGESTION      . [DISCONTINUED] roflumilast (DALIRESP) 500 MCG TABS tablet Take 500 mcg by mouth daily.      [name] azithromycin (ZITHROMAX) 250 MG tablet Take 1 tablet (250 mg total) by mouth daily.  Take two once then one daily until gone  6 each  0  . [DISCONTINUED] ferrous sulfate (FERROUSUL) 325 (65 FE) MG tablet Take 1 tablet (325 mg total) by mouth 3 (three) times daily with meals.  90 tablet  0  . [DISCONTINUED] HYDROcodone-acetaminophen (NORCO) 10-325 MG per tablet Take 1-2 tablets by mouth every 6 (six) hours as needed.  50 tablet  0  . [DISCONTINUED] rivaroxaban (XARELTO) 10 MG TABS tablet Take 1 tablet (10 mg total) by mouth daily with breakfast.  14 tablet  0   No facility-administered encounter medications on file as of 11/08/2012.

## 2012-11-08 NOTE — Evaluation (Signed)
Physical Therapy Evaluation  Patient Details  Name: Jerome Mays MRN: [id number] Date of Birth: 31-Jan-1941  Today's Date: 11/08/2012 Time: 0800-0840 PT Time Calculation (min): 40 min Charges:  Evaluation: 1  TE: 8:30-8:40             Visit#: 1 of 8  Re-eval: 12/08/12 Assessment Diagnosis: L TKR Surgical Date: 10/19/12 Next MD Visit: Dr. Leslee Home - 1 month Prior Therapy: [facility]  Authorization: Medicare    Authorization Time Period:    Authorization Visit#: 1 of 10   Past Medical History:  Past Medical History  Diagnosis Date  . Malignant tumor of kidney   . Renal calculus   . Colonic polyp   . COPD (chronic obstructive pulmonary disease)   . GERD (gastroesophageal reflux disease)   . Gout   . Shortness of breath   . Arthritis   . Pain     LOWER BACK AND LEFT HIP - HX OF PREVIOUS LUMBAR AND CERVICAL SURGERY--PT STATES HE HAS HAD NUMBNESS IN BOTH FEET SINCE HIS BACK SURGERY   Past Surgical History:  Past Surgical History  Procedure Laterality Date  . Hand surgery      left  . Knee surgery      right  . Laminectomy      cervical  . Nephrectomy      PT STATES RIGHT KIDNEY WAS REMOVED  . 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 Back surgery      LUMBAR SURGERY  . Eye surgery      BILATERAL CATARACT EXTRACTIONS  . Total knee arthroplasty Left 10/19/2012    Procedure: LEFT TOTAL KNEE ARTHROPLASTY;  Surgeon: Drucilla Schmidt, MD;  Location: [facility];  Service: Orthopedics;  Laterality: Left;    Subjective Symptoms/Limitations Pertinent History: Pt is a 72year old male referred to PT s/p Lt TKR on 10/19/12.  This morning his throat is sore and states that he had some difficulty with his medications for his COPD when he was in the hospital and is not completly well from it.  He has a follow up with his pulmonologist today.  His c/co are mild stiffness, step to pattern for stairs, ambulating with SPC outdoors Limitations: Standing;Walking How long can you sit comfortably?:  hours How long can you stand comfortably?: 10-15 minutes  How long can you walk comfortably?: 20-30 minutes Patient Stated Goals: walk correctly Pain Assessment Currently in Pain?: Yes Pain Score:   2 Pain Location: Knee Pain Orientation: Left Pain Type: Acute pain;Surgical pain Pain Frequency: Occasional Pain Relieving Factors: ice after exercises  Precautions/Restrictions  Precautions Precautions: Knee Restrictions LLE Weight Bearing: Weight bearing as tolerated  Balance Screening Balance Screen Has the patient fallen in the past 6 months: No Has the patient had a decrease in activity level because of a fear of falling? : Yes Is the patient reluctant to leave their home because of a fear of falling? : No  Prior Functioning  Home Living Lives With: Spouse Home Layout: Two level;Able to live on main level with bedroom/bathroom Alternate Level Stairs-Number of Steps: 10-12 (basement) Prior Function Comments: He enjoys traveling to the beach, being with his family and staying active. He enjoys woodworking.   Sensation/Coordination/Flexibility/Functional Tests Functional Tests Functional Tests: Lower Extremity Functional Scale (LEFS) 44/80 Functional Tests: 5 sit to stands without UE assist:  23 sec (favor LLE)  Assessment LLE AROM (degrees) Left Knee Extension: 8 Left Knee Flexion: 100 LLE PROM (degrees) Left Knee Extension: 5 Left Knee Flexion: 108 LLE  Strength Left Hip Flexion: 4/5 Left Hip Extension: 3/5 Left Hip ABduction: 3+/5 Left Hip ADduction: 4/5 Left Knee Flexion: 3+/5 Left Knee Extension: 4/5 Left Ankle Dorsiflexion: 5/5 Palpation Palpation: pain and tenderness to LLE quadricep, hamstrings, gastroc, solues, and hip flexor region.  Decreased fascial mobility to distal scar.   Mobility/Balance  Ambulation/Gait Ambulation/Gait Assistance: 6: Modified independent (Device/Increase time) Assistive device: Straight cane Gait Pattern: Decreased stance time  - left;Decreased hip/knee flexion - left Static Standing Balance Single Leg Stance - Right Leg: 10 Single Leg Stance - Left Leg: 0 Tandem Stance - Right Leg: 10 Tandem Stance - Left Leg: 10 Rhomberg - Eyes Opened: 10 Rhomberg - Eyes Closed: 10   Exercise/Treatments Stretches Active Hamstring Stretch: 1 rep;30 seconds Standing SLS: 1x30 sec with HHA Supine Quad Sets: 10 reps Short Arc Quad Sets: Left;5 reps;Limitations Short Arc Quad Sets Limitations: 5 sec holds Heel Slides: 5 reps Patellar Mobs: demonstration and instruction Sidelying Hip ABduction: Left;10 reps Hip ADduction: Left;10 reps Prone  Hamstring Curl: 5 reps;Limitations Hamstring Curl Limitations: AAROM 5 sec holds Hip Extension: Left;5 reps Other Prone Exercises: Hip IR x5   Physical Therapy Assessment and Plan PT Assessment and Plan Clinical Impression Statement: Pt is a 72 year old male referred to PT s/p Lt TKR and has a significant hx of Rt TKR with following impairments listed below.  Pt will benefit from skilled therapeutic intervention in order to improve on the following deficits: Abnormal gait;Decreased activity tolerance;Decreased balance;Difficulty walking;Decreased strength;Decreased range of motion;Decreased mobility;Increased fascial restricitons;Pain;Improper body mechanics;Impaired perceived functional ability;Impaired flexibility Rehab Potential: Good PT Frequency: Min 2X/week PT Duration: 4 weeks PT Treatment/Interventions: Gait training;Stair training;Functional mobility training;Therapeutic activities;Therapeutic exercise;Balance training;Neuromuscular re-education;Patient/family education;Manual techniques;Modalities PT Plan: Start with NuStep or Bike and continue to progress towards TM walking.  Start stair training, SLS, and balance activities.  Progress towards independent ambulation. Enocurage knee extension during gait.     Goals Home Exercise Program Pt will Perform Home Exercise  Program: Independently PT Goal: Perform Home Exercise Program - Progress: Goal set today PT Short Term Goals PT Short Term Goal 1: Pt will improve his AROM to 0-110 for improved gait mechanics.  PT Short Term Goal 2: Pt will report pain less than 3/10 for 50% of his day.  PT Short Term Goal 3: Pt will improve his LLE strength by 1 muscle grade for greater ease with sit to stand activities.  PT Short Term Goal 4: Pt will improved proprioceptive awareness and demonstrate Lt SLS x15 seconds on solid surface without HHA.  PT Long Term Goals PT Long Term Goal 1: Pt will report pain less than 2/10 for 75% of his day for improved QOL.  PT Long Term Goal 2: Pt will improve his Lt knee AROM 0-115 degrees for ability to ascend and descend 10-12 steps with recpiorcal pattern and 1 handrail for greater ease into his basement.  Long Term Goal 3: Pt will improve his LLE strength to [facility] in order to ambulate indepedently with appropriate gait mechanics to decrease risk of secondary injury.  Long Term Goal 4: Pt will improve his LEFS to greater than 60/80 for improved percieved functional ability.  PT Long Term Goal 5: Pt will improve his LLE activity tolerance in order to tolerate walking and standing for greater than 1 hour in order to continue with his woodworking hobbies.   Problem List Patient Active Problem List   Diagnosis Date Noted  . Stiffness of left knee 11/08/2012  . Difficulty in  walking(719.7) 11/08/2012  . Gout 03/04/2012  . ENLARGEMENT OF LYMPH NODES 08/21/2009  . CLOS FRACTURE MID/PROXIMAL PHALANX/PHALANG HAND 07/31/2009  . 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 COPD, Gold C  12/06/2007  . COLONIC POLYPS, HX OF 12/05/2007  . RENAL CALCULUS, HX OF 12/05/2007    General Behavior During Therapy: [facility] for tasks assessed/performed Cognition: [facility] for tasks performed PT Plan of Care PT Home Exercise Plan: see scanned report PT Patient Instructions: importance of knee extension with  ambulation, HEP to encourage knee extension, answered questions about diagnosis.  Consulted and Agree with Plan of Care: Patient  GP Functional Assessment Tool Used: LEFS: 44/80 Functional Limitation: Mobility: Walking and moving around Mobility: Walking and Moving Around Current Status (Z3086): At least 20 percent but less than 40 percent impaired, limited or restricted Mobility: Walking and Moving Around Goal Status [id number]): At least 1 percent but less than 20 percent impaired, limited or restricted  Chanti Golubski, MPT, ATC 11/08/2012, 8:59 AM  Physician Documentation Your signature is required to indicate approval of the treatment plan as stated above.  Please sign and either send electronically or make a copy of this report for your files and return this physician signed original.   Please mark one 1.__approve of plan  2. ___approve of plan with the following conditions.   ______________________________                                                          _____________________ Physician Signature                                                                                                             Date

## 2012-11-09 NOTE — Assessment & Plan Note (Signed)
Mild exac with gold C Copd Plan Azithromycin x 5days No change in inhaled meds

## 2012-11-10 ENCOUNTER — Ambulatory Visit (HOSPITAL_COMMUNITY)
Admission: RE | Admit: 2012-11-10 | Discharge: 2012-11-10 | Disposition: A | Discharge status: Home / Self Care | Payer: Medicare Other | Source: Ambulatory Visit | Attending: Orthopedic Surgery | Admitting: Orthopedic Surgery

## 2012-11-10 NOTE — Progress Notes (Signed)
Physical Therapy Treatment Patient Details  Name: BRADY SCHILLER MRN: [id number] Date of Birth: September 26, 1940  Today's Date: 11/10/2012 Time: 0930-1025 PT Time Calculation (min): 55 min  Visit#: 2 of 8  Re-eval: 12/08/12 Charges: Therex x 40' Ice x 10'  Authorization: Medicare  Authorization Visit#: 2 of 10   Subjective: Symptoms/Limitations Symptoms: Pt reports HEP compliance.  Pain Assessment Currently in Pain?: No/denies   Exercise/Treatments Aerobic Stationary Bike: 8' seat10 full revoultions Standing Heel Raises: 10 reps;Limitations Heel Raises Limitations: Toe raises x 10 Knee Flexion: 10 reps Functional Squat: 10 reps Rocker Board: 2 minutes SLS: LLE:5" max of 5 Supine Quad Sets: 10 reps Short Arc Quad Sets: 10 reps Short Arc Quad Sets Limitations: 5" holds Heel Slides: 10 reps Knee Extension: PROM Knee Flexion: PROM Prone  Prone Knee Hang: Limitations Prone Knee Hang Limitations: Demonstraiton for HEP   Manual Therapy Manual Therapy: Joint mobilization Joint Mobilization: Grade II-III a/p joint mobs to left kne to improve ROM  Physical Therapy Assessment and Plan PT Assessment and Plan Clinical Impression Statement: Pt able to complete full revolutions on bike after rocking. Pt competes new exercises well after initial cueing and demo. Pt requires multimodal cueing to facilitate distal quad contraction with quad sets. Demonstrated prone knee hang for pt to complete at home. Ice applied at end of session to limit pain and inflammation. Pt will benefit from skilled therapeutic intervention in order to improve on the following deficits: Abnormal gait;Decreased activity tolerance;Decreased balance;Difficulty walking;Decreased strength;Decreased range of motion;Decreased mobility;Increased fascial restricitons;Pain;Improper body mechanics;Impaired perceived functional ability;Impaired flexibility Rehab Potential: Good PT Frequency: Min 2X/week PT Duration: 4  weeks PT Treatment/Interventions: Gait training;Stair training;Functional mobility training;Therapeutic activities;Therapeutic exercise;Balance training;Neuromuscular re-education;Patient/family education;Manual techniques;Modalities PT Plan: Progress to stair training and balance activities.  Progress towards independent ambulation. Enocurage knee extension during gait.      Problem List Patient Active Problem List   Diagnosis Date Noted  . Stiffness of left knee 11/08/2012  . Difficulty in walking(719.7) 11/08/2012  . Gout 03/04/2012  . ENLARGEMENT OF LYMPH NODES 08/21/2009  . CLOS FRACTURE MID/PROXIMAL PHALANX/PHALANG HAND 07/31/2009  . 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 COPD, Gold C  12/06/2007  . COLONIC POLYPS, HX OF 12/05/2007  . RENAL CALCULUS, HX OF 12/05/2007    PT - End of Session Activity Tolerance: Patient tolerated treatment well General Behavior During Therapy: [facility] for tasks assessed/performed Cognition: WFL for tasks performed  Seth Bake, PTA  11/10/2012, 11:03 AM

## 2012-11-11 ENCOUNTER — Ambulatory Visit: Payer: Medicare Other | Admitting: Critical Care Medicine

## 2012-11-15 ENCOUNTER — Ambulatory Visit (HOSPITAL_COMMUNITY)
Admission: RE | Admit: 2012-11-15 | Discharge: 2012-11-15 | Disposition: A | Discharge status: Home / Self Care | Payer: Medicare Other | Source: Ambulatory Visit | Attending: Orthopedic Surgery | Admitting: Orthopedic Surgery

## 2012-11-15 NOTE — Progress Notes (Signed)
Physical Therapy Treatment Patient Details  Name: Jerome Mays MRN: [id number] Date of Birth: Mar 28, 1941  Today's Date: 11/15/2012 Time: 4098-1191 PT Time Calculation (min): 50 min  Visit#: 3 of 8  Re-eval: 12/08/12 Charges: Therex x 38' Ice x 1  Authorization: Medicare  Authorization Visit#: 3 of 10   Subjective: Symptoms/Limitations Symptoms: Pt reports no pain and continued HEP compliance. Pain Assessment Currently in Pain?: No/denies  Objective LLE AROM (degrees) Left Knee Extension: 4 Left Knee Flexion: 110   Exercise/Treatments Aerobic Stationary Bike: 8' seat10 full revoultions Standing Heel Raises: 10 reps;Limitations Heel Raises Limitations: Toe raises x 10 Knee Flexion: 10 reps Lateral Step Up: 10 reps;Left;Hand Hold: 2;Step Height: 4" Forward Step Up: 10 reps;Left;Step Height: 4";Hand Hold: 1 Functional Squat: 10 reps Rocker Board: 2 minutes SLS: LLE:9" max of 5 Supine Quad Sets: 10 reps Short Arc Quad Sets: 10 reps Short Arc Quad Sets Limitations: 5" holds Heel Slides: 10 reps Knee Extension: PROM Knee Flexion: PROM   Modalities Modalities: Cryotherapy Manual Therapy Manual Therapy: Joint mobilization Joint Mobilization: Grade II-III a/p joint mobs to left kne to improve ROM Cryotherapy Number Minutes Cryotherapy: 10 Minutes Cryotherapy Location: Knee (Left) Type of Cryotherapy: Ice pack  Physical Therapy Assessment and Plan PT Assessment and Plan Clinical Impression Statement: Pt ambulates into therapy without AD. Began step-ups to improve functional strength with multimodal cueing for form. Pt displays improved stability with SLS. AROM has also improved. Ice applied at end of session to limit pain and inflammation. Pt will benefit from skilled therapeutic intervention in order to improve on the following deficits: Abnormal gait;Decreased activity tolerance;Decreased balance;Difficulty walking;Decreased strength;Decreased range of  motion;Decreased mobility;Increased fascial restricitons;Pain;Improper body mechanics;Impaired perceived functional ability;Impaired flexibility Rehab Potential: Good PT Frequency: Min 2X/week PT Duration: 4 weeks PT Treatment/Interventions: Gait training;Stair training;Functional mobility training;Therapeutic activities;Therapeutic exercise;Balance training;Neuromuscular re-education;Patient/family education;Manual techniques;Modalities PT Plan: Progress to stair training and balance activities.  Progress towards independent ambulation. Enocurage knee extension during gait.      Problem List Patient Active Problem List   Diagnosis Date Noted  . Stiffness of left knee 11/08/2012  . Difficulty in walking(719.7) 11/08/2012  . Gout 03/04/2012  . ENLARGEMENT OF LYMPH NODES 08/21/2009  . CLOS FRACTURE MID/PROXIMAL PHALANX/PHALANG HAND 07/31/2009  . 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 COPD, Gold C  12/06/2007  . COLONIC POLYPS, HX OF 12/05/2007  . RENAL CALCULUS, HX OF 12/05/2007    PT - End of Session Activity Tolerance: Patient tolerated treatment well General Behavior During Therapy: [facility] for tasks assessed/performed Cognition: WFL for tasks performed  Seth Bake, PTA  11/15/2012, 9:55 AM

## 2012-11-17 ENCOUNTER — Ambulatory Visit (HOSPITAL_COMMUNITY)
Admission: RE | Admit: 2012-11-17 | Discharge: 2012-11-17 | Disposition: A | Discharge status: Home / Self Care | Payer: Medicare Other | Source: Ambulatory Visit | Attending: Orthopedic Surgery | Admitting: Orthopedic Surgery

## 2012-11-17 DIAGNOSIS — R262 Difficulty in walking, not elsewhere classified: Secondary | ICD-10-CM

## 2012-11-17 DIAGNOSIS — M25662 Stiffness of left knee, not elsewhere classified: Secondary | ICD-10-CM

## 2012-11-17 NOTE — Progress Notes (Signed)
Physical Therapy Treatment Patient Details  Name: Jerome Mays MRN: [id number] Date of Birth: [birth year]  Today's Date: 11/17/2012 Time: 0802-0855 PT Time Calculation (min): 53 min TE: 802-832, Manual: [phone], Ice: 854-855 Visit#: 4 of 8  Re-eval: 12/08/12    Authorization: Medicare  Authorization Time Period:    Authorization Visit#: 4 of 10   Subjective: Symptoms/Limitations Symptoms: Pt reports that he is walking better (about 25-30 minutes) and going up and down stairs with reciprocal pattern.  Reports stiffness without pain and difficulty getting comfortable at night time.  Pain Assessment Currently in Pain?: No/denies  Precautions/Restrictions     Exercise/Treatments Mobility/Balance        Stretches   Aerobic Stationary Bike: 8' seat10 full revoultions 6.0 for activity tolerance Machines for Strengthening   Plyometrics   Standing Other Standing Knee Exercises: Tandem Gait 1 RT, Retro Gait 1 RT, Hurdles 6 in. 2 RT with RLE and LLE leading Seated   Supine   Sidelying   Prone  Prone Knee Hang: Limitations Prone Knee Hang Limitations: 6 minutes   Manual Therapy Manual Therapy: Myofascial release Myofascial Release: Supine with Knee flexion to lateral knee, distal quadricep and proximal scar tissue, Prone: during prone hang to medial lateral hamstring to decrease fascial restrictions and improve ROM.[name]  Cryotherapy Number Minutes Cryotherapy: 10 Minutes Cryotherapy Location: Knee Type of Cryotherapy: Ice pack  Physical Therapy Assessment and Plan PT Assessment and Plan Clinical Impression Statement: Added balance activities to improve proprioceptive awareness and promote knee flexion. AROM at end of session: 1-111, PROM: 0-115 PT Plan: Progress to stair training and balance activities.  Progress towards independent ambulation. Enocurage knee extension during gait.     Goals Home Exercise Program Pt will Perform Home Exercise Program:  Independently PT Goal: Perform Home Exercise Program - Progress: Met PT Short Term Goals PT Short Term Goal 1: Pt will improve his AROM to 0-110 for improved gait mechanics.  PT Short Term Goal 1 - Progress: Progressing toward goal PT Short Term Goal 2: Pt will report pain less than 3/10 for 50% of his day.  PT Short Term Goal 2 - Progress: Met PT Short Term Goal 3: Pt will improve his LLE strength by 1 muscle grade for greater ease with sit to stand activities.  PT Short Term Goal 3 - Progress: Progressing toward goal PT Short Term Goal 4: Pt will improved proprioceptive awareness and demonstrate Lt SLS x15 seconds on solid surface without HHA.  PT Short Term Goal 4 - Progress: Progressing toward goal PT Long Term Goals PT Long Term Goal 1: Pt will report pain less than 2/10 for 75% of his day for improved QOL.  PT Long Term Goal 1 - Progress: Progressing toward goal PT Long Term Goal 2: Pt will improve his Lt knee AROM 0-115 degrees for ability to ascend and descend 10-12 steps with recpiorcal pattern and 1 handrail for greater ease into his basement.  PT Long Term Goal 2 - Progress: Progressing toward goal Long Term Goal 3: Pt will improve his LLE strength to [facility] in order to ambulate indepedently with appropriate gait mechanics to decrease risk of secondary injury.  Long Term Goal 3 Progress: Progressing toward goal Long Term Goal 4: Pt will improve his LEFS to greater than 60/80 for improved percieved functional ability.  Long Term Goal 4 Progress: Progressing toward goal PT Long Term Goal 5: Pt will improve his LLE activity tolerance in order to tolerate walking and standing for greater than 1  hour in order to continue with his woodworking hobbies.  Long Term Goal 5 Progress: Progressing toward goal  Problem List Patient Active Problem List   Diagnosis Date Noted  . Stiffness of left knee 11/08/2012  . Difficulty in walking(719.7) 11/08/2012  . Gout 03/04/2012  . ENLARGEMENT OF  LYMPH NODES 08/21/2009  . CLOS FRACTURE MID/PROXIMAL PHALANX/PHALANG HAND 07/31/2009  . 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 COPD, Gold C  12/06/2007  . COLONIC POLYPS, HX OF 12/05/2007  . RENAL CALCULUS, HX OF 12/05/2007    PT - End of Session Activity Tolerance: Patient tolerated treatment well General Behavior During Therapy: WFL for tasks assessed/performed Cognition: WFL for tasks performed  GP Functional Assessment Tool Used: LEFS: 44/80  Jerome Mays 11/17/2012, 8:48 AM

## 2012-11-22 ENCOUNTER — Ambulatory Visit (HOSPITAL_COMMUNITY): Payer: Medicare Other | Admitting: Physical Therapy

## 2012-11-23 ENCOUNTER — Ambulatory Visit (HOSPITAL_COMMUNITY)
Admission: RE | Admit: 2012-11-23 | Discharge: 2012-11-23 | Disposition: A | Discharge status: Home / Self Care | Payer: Medicare Other | Source: Ambulatory Visit | Attending: Physical Therapy | Admitting: Physical Therapy

## 2012-11-23 DIAGNOSIS — R262 Difficulty in walking, not elsewhere classified: Secondary | ICD-10-CM

## 2012-11-23 DIAGNOSIS — M25662 Stiffness of left knee, not elsewhere classified: Secondary | ICD-10-CM

## 2012-11-23 NOTE — Progress Notes (Signed)
Physical Therapy Treatment Patient Details  Name: Jerome Mays MRN: [id number] Date of Birth: Jun 18, 1940  Today's Date: 11/23/2012 Time: 1640-1720 PT Time Calculation (min): 40 min  Visit#: 6 of 8  Re-eval: 12/08/12 Authorization: Medicare  Authorization Visit#: 6 of 10  Charges:  therex 38'  Subjective: Symptoms/Limitations Symptoms: Pt reports soreness for about 2 days after prone hang with manual therapy last visit.  Only able to handle 1 minute at most of prone hangs.  Pain Assessment Currently in Pain?: Yes Pain Score:   1 Pain Location: Knee Pain Orientation: Left  Exercises began by Annett Fabian, PT 16:40-17:00 Exercise/Treatments Stretches Active Hamstring Stretch: 3 reps;30 seconds Aerobic Stationary Bike: 8' seat10 full revoultions 6.0 for activity tolerance Machines for Strengthening Cybex Knee Extension: 2 PL LLE 15 reps Cybex Knee Flexion: 3 PL BLE 15 reps Standing Terminal Knee Extension: Left;10 reps;Theraband;Limitations Theraband Level (Terminal Knee Extension): Level 4 (Blue) Terminal Knee Extension Limitations: 5 sec holds Lateral Step Up: Left;10 reps;Hand Hold: 1;Step Height: 6" Forward Step Up: Left;10 reps;Hand Hold: 0;Step Height: 6" Step Down: Left;10 reps;Hand Hold: 1;Step Height: 4" Functional Squat: 10 reps Rocker Board: 2 minutes Other Standing Knee Exercises: Tandem Gait 1 RT, Retro Gait 1 RT, side stepping 1RT Supine Quad Sets: Left;10 reps Other Supine Knee Exercises: hip IR 10 reps 5 sec holds Prone  Other Prone Exercises: TKE: 10x5 sec holds      Physical Therapy Assessment and Plan PT Assessment and Plan Clinical Impression Statement: Held prone extension due to c/o soreness afterward; ROM is coming along nicely without need for ROM/manual techniques today.  Per PT, focus on strengthening/stability.  Added cybex quad/ham machines and forward step downs.  Pt exhibits good form with most exercises. PT Plan: Continue per POC.   Progress to stair training and balance activities.     Problem List Patient Active Problem List   Diagnosis Date Noted  . Stiffness of left knee 11/08/2012  . Difficulty in walking(719.7) 11/08/2012  . Gout 03/04/2012  . ENLARGEMENT OF LYMPH NODES 08/21/2009  . CLOS FRACTURE MID/PROXIMAL PHALANX/PHALANG HAND 07/31/2009  . 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 COPD, Gold C  12/06/2007  . COLONIC POLYPS, HX OF 12/05/2007  . RENAL CALCULUS, HX OF 12/05/2007    PT - End of Session Activity Tolerance: Patient tolerated treatment well General Behavior During Therapy: [facility] for tasks assessed/performed Cognition: WFL for tasks performed   Lurena Nida, PTA/CLT 11/23/2012, 5:18 PM

## 2012-11-24 ENCOUNTER — Ambulatory Visit (HOSPITAL_COMMUNITY)
Admission: RE | Admit: 2012-11-24 | Discharge: 2012-11-24 | Disposition: A | Discharge status: Home / Self Care | Payer: Medicare Other | Source: Ambulatory Visit | Attending: Orthopedic Surgery | Admitting: Orthopedic Surgery

## 2012-11-24 DIAGNOSIS — R262 Difficulty in walking, not elsewhere classified: Secondary | ICD-10-CM

## 2012-11-24 DIAGNOSIS — M25662 Stiffness of left knee, not elsewhere classified: Secondary | ICD-10-CM

## 2012-11-24 NOTE — Progress Notes (Addendum)
Physical Therapy Treatment Patient Details  Name: Jerome Mays MRN: [id number] Date of Birth: 10-16-1940  Today's Date: 11/24/2012 Time: 0805-0903 PT Time Calculation (min): 58 min  Visit#: 7 of 8  Re-eval: 12/08/12  charge: there ex 3;8:05 -847, IP [phone]  Authorization: Medicare  Authorization Visit#: 7 of 8   Subjective: Symptoms/Limitations Symptoms: Pt states he is not having any difficulty with the exercises since he stopped the prone hang. Pain Assessment Currently in Pain?: No/denies   Exercise/Treatments  Stretches Active Hamstring Stretch: 3 reps;30 seconds Gastroc Stretch:  (slant board 3 x 30) Aerobic Stationary Bike: 8' seat10 full revoultions 6.0 for activity tolerance Machines for Strengthening Cybex Knee Extension: 2 PL LLE 15 reps Cybex Knee Flexion: 3 PL BLE 15 reps   Standing Heel Raises: 15 reps Terminal Knee Extension: Left;10 reps;Theraband;Limitations Theraband Level (Terminal Knee Extension): Level 4 (Blue) Terminal Knee Extension Limitations: 5 sec holds Rocker Board: 2 minutes SLS: 5 x Supine Quad Sets: Left;10 reps Heel Slides: 10 reps Terminal Knee Extension: 10 reps Knee Extension: PROM Knee Flexion: PROM   Prone  Other Prone Exercises: TKE: 10x5 sec holds   IP x 12' Physical Therapy Assessment and Plan PT Assessment and Plan Clinical Impression Statement: Pt ROM increasing.  Pt worked on balance with vector stances and completing step ups without UE assist PT Treatment/Interventions: Gait training;Stair training;Functional mobility training;Therapeutic activities;Therapeutic exercise;Balance training;Neuromuscular re-education;Patient/family education;Manual techniques;Modalities PT Plan: Reassess next treatment    Goals   progressing Problem List Patient Active Problem List   Diagnosis Date Noted  . Stiffness of left knee 11/08/2012  . Difficulty in walking(719.7) 11/08/2012  . Gout 03/04/2012  . ENLARGEMENT OF LYMPH  NODES 08/21/2009  . CLOS FRACTURE MID/PROXIMAL PHALANX/PHALANG HAND 07/31/2009  . 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 COPD, Gold C  12/06/2007  . COLONIC POLYPS, HX OF 12/05/2007  . RENAL CALCULUS, HX OF 12/05/2007    PT - End of Session Activity Tolerance: Patient tolerated treatment well General Behavior During Therapy: [facility] for tasks assessed/performed Cognition: WFL for tasks performed  GP    RUSSELL,CINDY 11/24/2012, 11:52 AM

## 2012-11-29 ENCOUNTER — Ambulatory Visit (HOSPITAL_COMMUNITY)
Admission: RE | Admit: 2012-11-29 | Discharge: 2012-11-29 | Disposition: A | Discharge status: Home / Self Care | Payer: Medicare Other | Source: Ambulatory Visit | Attending: *Deleted | Admitting: *Deleted

## 2012-11-29 NOTE — Evaluation (Signed)
Physical Therapy Discharge Summary  Patient Details  Name: Jerome Mays MRN: [id number] Date of Birth: June 29, 1940  Today's Date: 11/29/2012 Time: 1632-1700 PT Time Calculation (min): 28 min              Visit#: 8 of 8  Re-eval: 12/08/12 Assessment Diagnosis: L TKR Surgical Date: 10/19/12 Next MD Visit: Dr. Leslee Home - 1 month Prior Therapy: [facility] Charges: MMT x 1  (7829-5621) Self care x (1645-1700)  Authorization: Medicare    Authorization Visit#: 8 of 8   Past Medical History:  Past Medical History  Diagnosis Date  . Malignant tumor of kidney   . Renal calculus   . Colonic polyp   . COPD (chronic obstructive pulmonary disease)   . GERD (gastroesophageal reflux disease)   . Gout   . Shortness of breath   . Arthritis   . Pain     LOWER BACK AND LEFT HIP - HX OF PREVIOUS LUMBAR AND CERVICAL SURGERY--PT STATES HE HAS HAD NUMBNESS IN BOTH FEET SINCE HIS BACK SURGERY   Past Surgical History:  Past Surgical History  Procedure Laterality Date  . Hand surgery      left  . Knee surgery      right  . Laminectomy      cervical  . Nephrectomy      PT STATES RIGHT KIDNEY WAS REMOVED  . 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 Back surgery      LUMBAR SURGERY  . Eye surgery      BILATERAL CATARACT EXTRACTIONS  . 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 He feels he is ready for D/C. Pain Assessment Currently in Pain?: No/denies   Sensation/Coordination/Flexibility/Functional Tests Functional Tests Functional Tests: Lower Extremity Functional Scale (LEFS) 70/80 (was 44/80) Functional Tests: 5 sit to stands without UE assist:  13 sec (was 23 sec)  Assessment LLE AROM (degrees) Left Knee Extension: 0 (was 4) Left Knee Flexion: 120 (was 110) LLE  Strength Left Hip Flexion: 5/5 (was 4/5) Left Hip Extension: 5/5 (was 3/5) Left Hip ABduction: 5/5 (was 3+/5) Left Hip ADduction: 5/5 (was 4/5) Left Knee Flexion: 5/5 (was3+/5) Left Knee Extension: 5/5 (was 4/5) Left Ankle Dorsiflexion: 5/5 (was 5/5)  Exercise/Treatments Mobility/Balance  Static Standing Balance Single Leg Stance - Right Leg: 15 (was 10) Single Leg Stance - Left Leg: 15 (was 10)   Bike x 5' for warm-up ([id number])  Physical Therapy Assessment and Plan PT Assessment and Plan Clinical Impression Statement: Pt has progressed well with therapy. All goals have been met. Strength and ROM is WNL. Lower extremity functional scale score improved 26 points since initial evaluation. Pt is independent with HEP and comfortable with D/C from PT.  PT Treatment/Interventions: Gait training;Stair training;Functional mobility training;Therapeutic activities;Therapeutic exercise;Balance training;Neuromuscular re-education;Patient/family education;Manual techniques;Modalities PT Plan: Recommend D/C to HEP.    Goals PT Short Term Goals PT Short Term Goal 1: Pt will improve his AROM to 0-110 for improved gait mechanics.  PT Short Term Goal 1 - Progress: Met PT Short Term Goal 2: Pt will report pain less than 3/10 for 50% of his day.  PT Short Term Goal 2 - Progress: Met PT Short Term Goal 3: Pt will improve his LLE strength by 1 muscle grade for greater ease with sit to stand activities.  PT Short Term Goal 3 - Progress: Met PT Short Term Goal 4: Pt will improved proprioceptive awareness and demonstrate Lt SLS x15 seconds on solid surface without HHA.  PT Short Term Goal 4 - Progress: Met PT Long Term Goals PT Long Term Goal 1: Pt will report pain less than 2/10 for 75% of his day for improved QOL.  PT Long Term Goal 1 - Progress: Met PT Long Term Goal 2: Pt will improve his Lt knee AROM 0-115 degrees for ability to ascend and descend 10-12 steps with recpiorcal pattern and 1 handrail  for greater ease into his basement.  PT Long Term Goal 2 - Progress: Met Long Term Goal 3: Pt will improve his LLE strength to [facility] in order to ambulate indepedently with appropriate gait mechanics to decrease risk of secondary injury.  Long Term Goal 3 Progress: Met Long Term Goal 4: Pt will improve his LEFS to greater than 60/80 for improved percieved functional ability.  Long Term Goal 4 Progress: Met PT Long Term Goal 5: Pt will improve his LLE activity tolerance in order to tolerate walking and standing for greater than 1 hour in order to continue with his woodworking hobbies.  Long Term Goal 5 Progress: Met  Problem List Patient Active Problem List   Diagnosis Date Noted  . Stiffness of left knee 11/08/2012  . Difficulty in walking(719.7) 11/08/2012  . Gout 03/04/2012  . ENLARGEMENT OF LYMPH NODES 08/21/2009  . CLOS FRACTURE MID/PROXIMAL PHALANX/PHALANG HAND 07/31/2009  . 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 COPD, Gold C  12/06/2007  . COLONIC POLYPS, HX OF 12/05/2007  . RENAL CALCULUS, HX OF 12/05/2007    PT - End of Session Activity Tolerance: Patient tolerated treatment well General Behavior During Therapy: WFL for tasks assessed/performed Cognition: WFL for tasks performed  GP Functional Assessment Tool Used: LEFS: 70/80 Functional Limitation: Mobility: Walking and moving around Mobility: Walking and Moving Around Goal Status [phone]): At least 1 percent but less than 20 percent impaired, limited or restricted Mobility: Walking and Moving Around Discharge Status [phone]): At least 1 percent but less than 20 percent impaired, limited or restricted  Seth Bake, PTA; Annett Fabian, MPT ATC  11/29/2012, 6:10 PM  Physician Documentation Your signature is required to indicate approval of the treatment plan as stated above.  Please sign and either send electronically or make a copy of this report for your files and return this physician signed original.   Please mark  one 1.__approve of plan  2. ___approve of plan with the following conditions.   ______________________________                                                          _____________________ Physician Signature  Date  

## 2012-12-01 ENCOUNTER — Ambulatory Visit (HOSPITAL_COMMUNITY): Payer: Medicare Other | Admitting: Physical Therapy

## 2013-03-24 ENCOUNTER — Ambulatory Visit (INDEPENDENT_AMBULATORY_CARE_PROVIDER_SITE_OTHER)
Admission: RE | Admit: 2013-03-24 | Discharge: 2013-03-24 | Disposition: A | Discharge status: Home / Self Care | Payer: Medicare Other | Source: Ambulatory Visit | Attending: Critical Care Medicine | Admitting: Critical Care Medicine

## 2013-03-24 ENCOUNTER — Ambulatory Visit (INDEPENDENT_AMBULATORY_CARE_PROVIDER_SITE_OTHER): Payer: Medicare Other | Admitting: Critical Care Medicine

## 2013-03-24 ENCOUNTER — Encounter: Payer: Self-pay | Admitting: Critical Care Medicine

## 2013-03-24 VITALS — BP 118/70 | HR 98 | Temp 98.9°F | Ht 73.0 in | Wt 205.2 lb

## 2013-03-24 DIAGNOSIS — R079 Chest pain, unspecified: Secondary | ICD-10-CM

## 2013-03-24 DIAGNOSIS — R0781 Pleurodynia: Secondary | ICD-10-CM

## 2013-03-24 DIAGNOSIS — J449 Chronic obstructive pulmonary disease, unspecified: Secondary | ICD-10-CM

## 2013-03-24 NOTE — Progress Notes (Signed)
Subjective:    Patient ID: Jerome Mays, male    DOB: 24-Jan-1941, 72 y.o.   MRN: [id number]  HPI  72 y.o. WM with COPD /AB golds stage C   03/24/2013 Chief Complaint  Patient presents with  . Follow-up    Pt c/o sharp pain on back, constant but worse with sharp inhale.  Prod cough with clear to yellow mucous.  SOB with activities.  Pt was drilling a piece of metal and started hurting and is in R side.  Sharp pain, occ aching.  No worse with deep breath. No real cough.  Not as much mucus. Mucus is clear to yellow. No real wheeze.   Past Medical History  Diagnosis Date  . Malignant tumor of kidney   . Renal calculus   . Colonic polyp   . COPD (chronic obstructive pulmonary disease)   . GERD (gastroesophageal reflux disease)   . Gout   . Shortness of breath   . Arthritis   . Pain     LOWER BACK AND LEFT HIP - HX OF PREVIOUS LUMBAR AND CERVICAL SURGERY--PT STATES HE HAS HAD NUMBNESS IN BOTH FEET SINCE HIS BACK SURGERY     Family History  Problem Relation Age of Onset  . Diabetes Mother   . COPD Brother   . COPD Sister      History   Social History  . Marital Status: Married    Spouse Name: N/A    Number of Children: N/A  . Years of Education: N/A   Occupational History  . Not on file.   Social History Main Topics  . Smoking status: Former Smoker -- 2.00 packs/day for 40 years    Types: Cigarettes, Pipe    Quit date: 06/09/1995  . Smokeless tobacco: Never Used  . Alcohol Use: No  . Drug Use: No  . Sexual Activity: Not on file   Other Topics Concern  . Not on file   Social History Narrative  . No narrative on file     Allergies  Allergen Reactions  . Atorvastatin Rash and Other (See Comments)    Passed out     Outpatient Prescriptions Prior to Visit  Medication Sig Dispense Refill  . allopurinol (ZYLOPRIM) 300 MG tablet Take 300 mg by mouth daily.       [name] aspirin 81 MG tablet Take 81 mg by mouth daily.      [name] dextromethorphan-guaiFENesin  (MUCINEX DM) 30-600 MG per 12 hr tablet Take 1 tablet by mouth 2 (two) times daily as needed.      . Fluticasone-Salmeterol (ADVAIR) 250-50 MCG/DOSE AEPB Inhale 1 puff into the lungs 2 (two) times daily.  60 each  11  . meloxicam (MOBIC) 15 MG tablet Take 15 mg by mouth daily.       . Multiple Vitamin (MULTIVITAMIN WITH MINERALS) TABS Take 1 tablet by mouth daily.      [name] omeprazole (PRILOSEC) 20 MG capsule Take 20 mg by mouth daily.       . roflumilast (DALIRESP) 500 MCG TABS tablet Take 1 tablet (500 mcg total) by mouth daily.  30 tablet  11  . azithromycin (ZITHROMAX) 250 MG tablet Take 1 tablet (250 mg total) by mouth daily. Take two once then one daily until gone  6 each  0   No facility-administered medications prior to visit.     Review of Systems  Constitutional:   No  weight loss, night sweats,  Fevers, chills, fatigue, lassitude. HEENT:  No headaches,  Difficulty swallowing,  Tooth/dental problems,  Sore throat,                No sneezing, itching, ear ache, nasal congestion, post nasal drip,   CV:  No chest pain,  Orthopnea, PND, swelling in lower extremities, anasarca, dizziness, palpitations  GI  No heartburn, indigestion, abdominal pain, nausea, vomiting, diarrhea, change in bowel habits, loss of appetite  Resp: Notes shortness of breath with exertion not at rest.  Notes excess mucus, notes productive cough,  No non-productive cough,  No coughing up of blood.  No change in color of mucus.  Notes  wheezing.  No chest wall deformity  Skin: no rash or lesions.  GU: no dysuria, change in color of urine, no urgency or frequency.  No flank pain.  MS:  No joint pain or swelling.  No decreased range of motion.  No back pain.  Psych:  No change in mood or affect. No depression or anxiety.  No memory loss.     Objective:   Physical Exam   Filed Vitals:   03/24/13 1623  BP: 118/70  Pulse: 98  Temp: 98.9 F (37.2 C)  TempSrc: Oral  Height: 6\' 1"  (1.854 m)  Weight: 205  lb 3.2 oz (93.078 kg)  SpO2: 92%    Gen: Pleasant, well-nourished, in no distress,  normal affect  ENT: No lesions,  mouth clear,  oropharynx clear, + postnasal drip  Neck: No JVD, no TMG, no carotid bruits  Lungs: No use of accessory muscles, no dullness to percussion, exp  wheeze   Cardiovascular: RRR, heart sounds normal, no murmur or gallops, no peripheral edema  Abdomen: soft and NT, no HSM,  BS normal  Musculoskeletal: No deformities, no cyanosis or clubbing  Neuro: alert, non focal  Skin: Warm, no lesions or rashes       Assessment & Plan:   COPD, Gold C  Gold Stage C copd with freq exacerbations Plan Cont daliresp Cont inhaled meds     Updated Medication List Outpatient Encounter Prescriptions as of 03/24/2013  Medication Sig Dispense Refill  . allopurinol (ZYLOPRIM) 300 MG tablet Take 300 mg by mouth daily.       [name] aspirin 81 MG tablet Take 81 mg by mouth daily.      [name] dextromethorphan-guaiFENesin (MUCINEX DM) 30-600 MG per 12 hr tablet Take 1 tablet by mouth 2 (two) times daily as needed.      . Fluticasone-Salmeterol (ADVAIR) 250-50 MCG/DOSE AEPB Inhale 1 puff into the lungs 2 (two) times daily.  60 each  11  . meloxicam (MOBIC) 15 MG tablet Take 15 mg by mouth daily.       . Multiple Vitamin (MULTIVITAMIN WITH MINERALS) TABS Take 1 tablet by mouth daily.      [name] omeprazole (PRILOSEC) 20 MG capsule Take 20 mg by mouth daily.       . roflumilast (DALIRESP) 500 MCG TABS tablet Take 1 tablet (500 mcg total) by mouth daily.  30 tablet  11  . [DISCONTINUED] azithromycin (ZITHROMAX) 250 MG tablet Take 1 tablet (250 mg total) by mouth daily. Take two once then one daily until gone  6 each  0   No facility-administered encounter medications on file as of 03/24/2013.

## 2013-03-24 NOTE — Patient Instructions (Signed)
Probable old rib fracture Use Advil or tylenol as needed for pain No other changes in medications Return 4 months

## 2013-03-25 NOTE — Assessment & Plan Note (Signed)
Gold Stage C copd with freq exacerbations Plan Cont daliresp Cont inhaled meds

## 2013-04-24 ENCOUNTER — Other Ambulatory Visit: Payer: Self-pay | Admitting: Internal Medicine

## 2013-09-22 ENCOUNTER — Other Ambulatory Visit: Payer: Self-pay | Admitting: Infectious Diseases

## 2013-11-13 ENCOUNTER — Other Ambulatory Visit: Payer: Self-pay | Admitting: Critical Care Medicine

## 2013-11-26 ENCOUNTER — Other Ambulatory Visit: Payer: Self-pay | Admitting: Critical Care Medicine

## 2014-01-08 ENCOUNTER — Encounter: Payer: Self-pay | Admitting: Critical Care Medicine

## 2014-01-08 ENCOUNTER — Ambulatory Visit (INDEPENDENT_AMBULATORY_CARE_PROVIDER_SITE_OTHER): Payer: Medicare Other | Admitting: Critical Care Medicine

## 2014-01-08 VITALS — BP 110/62 | HR 56 | Temp 97.0°F | Ht 73.0 in | Wt 196.6 lb

## 2014-01-08 DIAGNOSIS — Z23 Encounter for immunization: Secondary | ICD-10-CM

## 2014-01-08 DIAGNOSIS — J449 Chronic obstructive pulmonary disease, unspecified: Secondary | ICD-10-CM

## 2014-01-08 MED ORDER — FLUTICASONE-SALMETEROL 250-50 MCG/DOSE IN AEPB
INHALATION_SPRAY | RESPIRATORY_TRACT | Status: DC
Start: 2014-01-08 — End: 2015-01-07

## 2014-01-08 MED ORDER — ROFLUMILAST 500 MCG PO TABS: ORAL_TABLET | ORAL | Status: DC

## 2014-01-08 NOTE — Progress Notes (Signed)
Subjective:    Patient ID: Jerome Mays, male    DOB: 08-10-1940, 73 y.o.   MRN: [id number]  HPI 01/08/2014 Chief Complaint  Patient presents with  . Follow-up    Has SOB when walking up hills - this is unchanged.  Occas cough with clear mucus.  No chest tightness/pain or wheezing.  Dyspnea up hills.  Not much cough. No real chest pain. No blood in sputum.  No real tightness Pt denies any significant sore throat, nasal congestion or excess secretions, fever, chills, sweats, unintended weight loss, pleurtic or exertional chest pain, orthopnea PND, or leg swelling Pt denies any increase in rescue therapy over baseline, denies waking up needing it or having any early am or nocturnal exacerbations of coughing/wheezing/or dyspnea. Pt also denies any obvious fluctuation in symptoms with  weather or environmental change or other alleviating or aggravating factors      Review of Systems Constitutional:   No  weight loss, night sweats,  Fevers, chills, fatigue, lassitude. HEENT:   No headaches,  Difficulty swallowing,  Tooth/dental problems,  Sore throat,                No sneezing, itching, ear ache, nasal congestion, post nasal drip,   CV:  No chest pain,  Orthopnea, PND, swelling in lower extremities, anasarca, dizziness, palpitations  GI  No heartburn, indigestion, abdominal pain, nausea, vomiting, diarrhea, change in bowel habits, loss of appetite  Resp: No shortness of breath with exertion or at rest.  No excess mucus, no productive cough,  No non-productive cough,  No coughing up of blood.  No change in color of mucus.  No wheezing.  No chest wall deformity  Skin: no rash or lesions.  GU: no dysuria, change in color of urine, no urgency or frequency.  No flank pain.  MS:  No joint pain or swelling.  No decreased range of motion.  No back pain.  Psych:  No change in mood or affect. No depression or anxiety.  No memory loss.     Objective:   Physical Exam Filed Vitals:   01/08/14 1513  BP: 110/62  Pulse: 56  Temp: 97 F (36.1 C)  TempSrc: Oral  Height: 6\' 1"  (1.854 m)  Weight: 196 lb 9.6 oz (89.177 kg)  SpO2: 95%    Gen: Pleasant, well-nourished, in no distress,  normal affect  ENT: No lesions,  mouth clear,  oropharynx clear, no postnasal drip  Neck: No JVD, no TMG, no carotid bruits  Lungs: No use of accessory muscles, no dullness to percussion, clear without rales or rhonchi  Cardiovascular: RRR, heart sounds normal, no murmur or gallops, no peripheral edema  Abdomen: soft and NT, no HSM,  BS normal  Musculoskeletal: No deformities, no cyanosis or clubbing  Neuro: alert, non focal  Skin: Warm, no lesions or rashes  No results found.        Assessment & Plan:   COPD, Gold C  Gold stage C. COPD stable at this time Note positive response to [employer] inhaled medications with the Advair Return 6 months We did administer Prevnar 13 vaccine to the patient at this visit   Updated Medication List Outpatient Encounter Prescriptions as of 01/08/2014  Medication Sig  . allopurinol (ZYLOPRIM) 300 MG tablet Take 300 mg by mouth daily.   [name] aspirin 81 MG tablet Take 81 mg by mouth daily.  [name] dextromethorphan-guaiFENesin (MUCINEX DM) 30-600 MG per 12 hr tablet Take 1 tablet by  mouth 2 (two) times daily as needed.  . Fluticasone-Salmeterol (ADVAIR DISKUS) 250-50 MCG/DOSE AEPB inhale 1 dose by mouth twice a day  . meloxicam (MOBIC) 15 MG tablet Take 15 mg by mouth daily.   . Multiple Vitamin (MULTIVITAMIN WITH MINERALS) TABS Take 1 tablet by mouth daily.  [name] omeprazole (PRILOSEC) 20 MG capsule Take 20 mg by mouth daily.   . roflumilast (DALIRESP) 500 MCG TABS tablet TAKE ONE TABLET ONCE A DAY  . [DISCONTINUED] ADVAIR DISKUS 250-50 MCG/DOSE AEPB inhale 1 dose by mouth twice a day  . [DISCONTINUED] DALIRESP 500 MCG TABS tablet TAKE ONE TABLET ONCE A DAY

## 2014-01-08 NOTE — Assessment & Plan Note (Signed)
Gold stage C. COPD stable at this time Note positive response to [employer] inhaled medications with the Advair Return 6 months We did administer Prevnar 13 vaccine to the patient at this visit

## 2014-01-08 NOTE — Patient Instructions (Signed)
No change in medications A refill on all meds performed Prevnar 13 was given Return 6 months

## 2014-07-04 ENCOUNTER — Encounter: Payer: Self-pay | Admitting: Critical Care Medicine

## 2014-07-04 ENCOUNTER — Ambulatory Visit (INDEPENDENT_AMBULATORY_CARE_PROVIDER_SITE_OTHER): Payer: Medicare Other | Admitting: Critical Care Medicine

## 2014-07-04 VITALS — BP 116/60 | HR 55 | Temp 97.8°F | Ht 73.0 in | Wt 197.0 lb

## 2014-07-04 DIAGNOSIS — J449 Chronic obstructive pulmonary disease, unspecified: Secondary | ICD-10-CM

## 2014-07-04 NOTE — Progress Notes (Signed)
Subjective:    Patient ID: Jerome Mays, male    DOB: 1940/09/11, 74 y.o.   MRN: [id number]  HPI 07/04/2014 Chief Complaint  Patient presents with  . COPD    Breathing is unchanged since last OV. Reports throat clearing and SOB with exertion. Denies chest tightness or wheezing.  Pt has clear mucus and clearing of throat. Dyspnea is at baseline.  Notes some burping and GERD symptoms.  No real chest pain    No edema in feet Pt denies any significant sore throat, nasal congestion or excess secretions, fever, chills, sweats, unintended weight loss, pleurtic or exertional chest pain, orthopnea PND, or leg swelling Pt denies any increase in rescue therapy over baseline, denies waking up needing it or having any early am or nocturnal exacerbations of coughing/wheezing/or dyspnea. Pt also denies any obvious fluctuation in symptoms with  weather or environmental change or other alleviating or aggravating factors  Review of Systems Constitutional:   No  weight loss, night sweats,  Fevers, chills, fatigue, lassitude. HEENT:   No headaches,  Difficulty swallowing,  Tooth/dental problems,  Sore throat,                No sneezing, itching, ear ache, nasal congestion, post nasal drip,   CV:  No chest pain,  Orthopnea, PND, swelling in lower extremities, anasarca, dizziness, palpitations  GI  No heartburn, indigestion, abdominal pain, nausea, vomiting, diarrhea, change in bowel habits, loss of appetite  Resp: No shortness of breath with exertion or at rest.  No excess mucus, no productive cough,  No non-productive cough,  No coughing up of blood.  No change in color of mucus.  No wheezing.  No chest wall deformity  Skin: no rash or lesions.  GU: no dysuria, change in color of urine, no urgency or frequency.  No flank pain.  MS:  No joint pain or swelling.  No decreased range of motion.  No back pain.  Psych:  No change in mood or affect. No depression or anxiety.  No memory loss.       Objective:   Physical Exam Filed Vitals:   07/04/14 0930 07/04/14 0932  BP:  116/60  Pulse:  55  Temp: 97.8 F (36.6 C)   TempSrc: Oral   Height: 6\' 1"  (1.854 m)   Weight: 197 lb (89.359 kg)   SpO2:  96%    Gen: Pleasant, well-nourished, in no distress,  normal affect  ENT: No lesions,  mouth clear,  oropharynx clear, no postnasal drip  Neck: No JVD, no TMG, no carotid bruits  Lungs: No use of accessory muscles, no dullness to percussion, distant breath sounds   Cardiovascular: RRR, heart sounds normal, no murmur or gallops, no peripheral edema  Abdomen: soft and NT, no HSM,  BS normal  Musculoskeletal: No deformities, no cyanosis or clubbing  Neuro: alert, non focal  Skin: Warm, no lesions or rashes  No results found.        Assessment & Plan:   COPD with chronic bronchitis Gold stage C COPD with frequent exacerbations now improved with the use of [place] daily Maintain inhaled medications as prescribed     Updated Medication List Outpatient Encounter Prescriptions as of 07/04/2014  Medication Sig  . allopurinol (ZYLOPRIM) 300 MG tablet Take 300 mg by mouth daily.   [name] aspirin 81 MG tablet Take 81 mg by mouth daily.  [name] dextromethorphan-guaiFENesin (MUCINEX DM) 30-600 MG per 12 hr tablet Take 1 tablet by  mouth 2 (two) times daily as needed.  . Fluticasone-Salmeterol (ADVAIR DISKUS) 250-50 MCG/DOSE AEPB inhale 1 dose by mouth twice a day  . meloxicam (MOBIC) 15 MG tablet Take 15 mg by mouth daily.   . Multiple Vitamin (MULTIVITAMIN WITH MINERALS) TABS Take 1 tablet by mouth daily.  [name] omeprazole (PRILOSEC) 20 MG capsule Take 20 mg by mouth daily.   . roflumilast (DALIRESP) 500 MCG TABS tablet TAKE ONE TABLET ONCE A DAY

## 2014-07-04 NOTE — Patient Instructions (Signed)
No change in medications. Return in        6 months        

## 2014-07-04 NOTE — Assessment & Plan Note (Signed)
Gold stage C COPD with frequent exacerbations now improved with the use of [place] daily Maintain inhaled medications as prescribed

## 2014-08-18 ENCOUNTER — Encounter (HOSPITAL_COMMUNITY): Payer: Self-pay | Admitting: *Deleted

## 2014-08-18 ENCOUNTER — Emergency Department (HOSPITAL_COMMUNITY): Payer: Medicare Other

## 2014-08-18 ENCOUNTER — Emergency Department (HOSPITAL_COMMUNITY)
Admission: EM | Admit: 2014-08-18 | Discharge: 2014-08-18 | Disposition: A | Discharge status: Home / Self Care | Payer: Medicare Other | Attending: Emergency Medicine | Admitting: Emergency Medicine

## 2014-08-18 DIAGNOSIS — S81012A Laceration without foreign body, left knee, initial encounter: Secondary | ICD-10-CM | POA: Diagnosis not present

## 2014-08-18 DIAGNOSIS — J449 Chronic obstructive pulmonary disease, unspecified: Secondary | ICD-10-CM | POA: Diagnosis not present

## 2014-08-18 DIAGNOSIS — Z7982 Long term (current) use of aspirin: Secondary | ICD-10-CM | POA: Diagnosis not present

## 2014-08-18 DIAGNOSIS — Y998 Other external cause status: Secondary | ICD-10-CM | POA: Insufficient documentation

## 2014-08-18 DIAGNOSIS — Z86010 Personal history of colonic polyps: Secondary | ICD-10-CM | POA: Diagnosis not present

## 2014-08-18 DIAGNOSIS — Y9289 Other specified places as the place of occurrence of the external cause: Secondary | ICD-10-CM | POA: Insufficient documentation

## 2014-08-18 DIAGNOSIS — W293XXA Contact with powered garden and outdoor hand tools and machinery, initial encounter: Secondary | ICD-10-CM | POA: Diagnosis not present

## 2014-08-18 DIAGNOSIS — K219 Gastro-esophageal reflux disease without esophagitis: Secondary | ICD-10-CM | POA: Diagnosis not present

## 2014-08-18 DIAGNOSIS — M109 Gout, unspecified: Secondary | ICD-10-CM | POA: Diagnosis not present

## 2014-08-18 DIAGNOSIS — Y9389 Activity, other specified: Secondary | ICD-10-CM | POA: Diagnosis not present

## 2014-08-18 DIAGNOSIS — Z87442 Personal history of urinary calculi: Secondary | ICD-10-CM | POA: Diagnosis not present

## 2014-08-18 DIAGNOSIS — Z87891 Personal history of nicotine dependence: Secondary | ICD-10-CM | POA: Diagnosis not present

## 2014-08-18 DIAGNOSIS — Z79899 Other long term (current) drug therapy: Secondary | ICD-10-CM | POA: Diagnosis not present

## 2014-08-18 DIAGNOSIS — M1990 Unspecified osteoarthritis, unspecified site: Secondary | ICD-10-CM | POA: Diagnosis not present

## 2014-08-18 DIAGNOSIS — Z7951 Long term (current) use of inhaled steroids: Secondary | ICD-10-CM | POA: Diagnosis not present

## 2014-08-18 DIAGNOSIS — Z791 Long term (current) use of non-steroidal anti-inflammatories (NSAID): Secondary | ICD-10-CM | POA: Insufficient documentation

## 2014-08-18 DIAGNOSIS — Z23 Encounter for immunization: Secondary | ICD-10-CM | POA: Insufficient documentation

## 2014-08-18 MED ORDER — LIDOCAINE HCL (PF) 1 % IJ SOLN
INTRAMUSCULAR | Status: AC
  Administered 2014-08-18: 12:00:00
  Filled 2014-08-18: qty 5

## 2014-08-18 MED ORDER — TETANUS-DIPHTH-ACELL PERTUSSIS 5-2.5-18.5 LF-MCG/0.5 IM SUSP
0.5000 mL | Freq: Once | INTRAMUSCULAR | Status: AC
  Administered 2014-08-18: 0.5 mL via INTRAMUSCULAR
  Filled 2014-08-18: qty 0.5
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
## 2014-08-18 NOTE — Discharge Instructions (Signed)
Laceration Care, Adult A laceration is a cut that goes through all layers of the skin. The cut goes into the tissue beneath the skin. HOME CARE For stitches (sutures) or staples:  Keep the cut clean and dry.  If you have a bandage (dressing), change it at least once a day. Change the bandage if it gets wet or dirty, or as told by your doctor.  Wash the cut with soap and water 2 times a day. Rinse the cut with water. Pat it dry with a clean towel.  Put a thin layer of medicated cream on the cut as told by your doctor.  You may shower after the first 24 hours. Do not soak the cut in water until the stitches are removed.  Only take medicines as told by your doctor.  Have your stitches or staples removed as told by your doctor. For skin adhesive strips:  Keep the cut clean and dry.  Do not get the strips wet. You may take a bath, but be careful to keep the cut dry.  If the cut gets wet, pat it dry with a clean towel.  The strips will fall off on their own. Do not remove the strips that are still stuck to the cut. For wound glue:  You may shower or take baths. Do not soak or scrub the cut. Do not swim. Avoid heavy sweating until the glue falls off on its own. After a shower or bath, pat the cut dry with a clean towel.  Do not put medicine on your cut until the glue falls off.  If you have a bandage, do not put tape over the glue.  Avoid lots of sunlight or tanning lamps until the glue falls off. Put sunscreen on the cut for the first year to reduce your scar.  The glue will fall off on its own. Do not pick at the glue. You may need a tetanus shot if:  You cannot remember when you had your last tetanus shot.  You have never had a tetanus shot. If you need a tetanus shot and you choose not to have one, you may get tetanus. Sickness from tetanus can be serious. GET HELP RIGHT AWAY IF:   Your pain does not get better with medicine.  Your arm, hand, leg, or foot loses feeling  (numbness) or changes color.  Your cut is bleeding.  Your joint feels weak, or you cannot use your joint.  You have painful lumps on your body.  Your cut is red, puffy (swollen), or painful.  You have a red line on the skin near the cut.  You have yellowish-white fluid (pus) coming from the cut.  You have a fever.  You have a bad smell coming from the cut or bandage.  Your cut breaks open before or after stitches are removed.  You notice something coming out of the cut, such as wood or glass.  You cannot move a finger or toe. MAKE SURE YOU:   Understand these instructions.  Will watch your condition.  Will get help right away if you are not doing well or get worse. Document Released: 11/11/2007 Document Revised: 08/17/2011 Document Reviewed: 11/18/2010 [facility] Patient Information 2015 [place], [place]. This information is not intended to replace advice given to you by your health care provider. Make sure you discuss any questions you have with your health care provider.  Stitches, Staples, or Skin Adhesive Strips  Stitches (sutures), staples, and skin adhesive strips hold the skin together  as it heals. They will usually be in place for 7 days or less. HOME CARE  Wash your hands with soap and water before and after you touch your wound.  Only take medicine as told by your doctor.  Cover your wound only if your doctor told you to. Otherwise, leave it open to air.  Do not get your stitches wet or dirty. If they get dirty, dab them gently with a clean washcloth. Wet the washcloth with soapy water. Do not rub. Pat them dry gently.  Do not put medicine or medicated cream on your stitches unless your doctor told you to.  Do not take out your own stitches or staples. Skin adhesive strips will fall off by themselves.  Do not pick at the wound. Picking can cause an infection.  Do not miss your follow-up appointment.  If you have problems or questions, call your doctor. GET  HELP RIGHT AWAY IF:   You have a temperature by mouth above 102 F (38.9 C), not controlled by medicine.  You have chills.  You have redness or pain around your stitches.  There is puffiness (swelling) around your stitches.  You notice fluid (drainage) from your stitches.  There is a bad smell coming from your wound. MAKE SURE YOU:  Understand these instructions.  Will watch your condition.  Will get help if you are not doing well or get worse. Document Released: 03/22/2009 Document Revised: 08/17/2011 Document Reviewed: 03/22/2009 [facility] Patient Information 2015 [place], [place]. This information is not intended to replace advice given to you by your health care provider. Make sure you discuss any questions you have with your health care provider.

## 2014-08-18 NOTE — ED Notes (Signed)
Pt with lac to left knee with a chainsaw today, bleeding controlled

## 2015-01-01 ENCOUNTER — Other Ambulatory Visit: Payer: Self-pay | Admitting: Critical Care Medicine

## 2015-01-07 ENCOUNTER — Ambulatory Visit (INDEPENDENT_AMBULATORY_CARE_PROVIDER_SITE_OTHER): Payer: Medicare Other | Admitting: Critical Care Medicine

## 2015-01-07 ENCOUNTER — Encounter: Payer: Self-pay | Admitting: Critical Care Medicine

## 2015-01-07 VITALS — BP 138/70 | HR 69 | Temp 98.3°F | Ht 73.0 in | Wt 199.4 lb

## 2015-01-07 DIAGNOSIS — J449 Chronic obstructive pulmonary disease, unspecified: Secondary | ICD-10-CM | POA: Diagnosis not present

## 2015-01-07 DIAGNOSIS — S298XXA Other specified injuries of thorax, initial encounter: Secondary | ICD-10-CM

## 2015-01-07 DIAGNOSIS — S299XXA Unspecified injury of thorax, initial encounter: Secondary | ICD-10-CM

## 2015-01-07 MED ORDER — FLUTICASONE-SALMETEROL 250-50 MCG/DOSE IN AEPB: INHALATION_SPRAY | RESPIRATORY_TRACT | Status: DC

## 2015-01-07 MED ORDER — ROFLUMILAST 500 MCG PO TABS: ORAL_TABLET | ORAL | Status: DC
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
## 2015-01-07 NOTE — Patient Instructions (Signed)
No change in medications. Return in        6 months        
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
## 2015-06-17 ENCOUNTER — Ambulatory Visit: Payer: Medicare Other | Admitting: Pulmonary Disease

## 2015-07-24 ENCOUNTER — Encounter: Payer: Self-pay | Admitting: Pulmonary Disease

## 2015-07-24 ENCOUNTER — Ambulatory Visit (INDEPENDENT_AMBULATORY_CARE_PROVIDER_SITE_OTHER): Payer: Medicare Other | Admitting: Pulmonary Disease

## 2015-07-24 VITALS — BP 128/68 | HR 65 | Ht 73.0 in | Wt 196.8 lb

## 2015-07-24 DIAGNOSIS — J449 Chronic obstructive pulmonary disease, unspecified: Secondary | ICD-10-CM | POA: Diagnosis not present

## 2015-07-24 NOTE — Progress Notes (Signed)
Subjective:    Patient ID: Jerome Mays, male    DOB: December 17, 1940, 75 y.o.   MRN: [id number]  HPI  Follow-up for COPD.  Jerome Mays is a 75 Y/O with Gold stage C COPD. He is a former patient of Dr. Joya Mays. He's been maintained on Advair and Daliresp. He's had frequent exacerbations in the past but these have improved after the Daliresp was added. He is feeling well in the office today with no complaints other than dyspnea on exertion at baseline. He feels that the Daliresp is affecting his sleep and he is taking 2 Tylenol PMs at night to fall asleep.  DATA: CT scan 10/21/09 Moderate sentry lobar emphysema. No pulmonary masses, opacities, asbestos disease or consolidation. Stable enlarged. Stable enlarged mediastinal and bilateral hilar lymph nodes unchanged for past 3.5 years compatible with benign or reactive process.  Social history: 60-pack-year smoking history. Quit in 1977. No alcohol or drug use.  Family history: Mother-diabetes mellitus  Past Medical History  Diagnosis Date  . Renal calculus   . Colonic polyp   . COPD (chronic obstructive pulmonary disease) ([place])   . GERD (gastroesophageal reflux disease)   . Gout   . Shortness of breath   . Arthritis   . Pain     LOWER BACK AND LEFT HIP - HX OF PREVIOUS LUMBAR AND CERVICAL SURGERY--PT STATES HE HAS HAD NUMBNESS IN BOTH FEET SINCE HIS BACK SURGERY  . Malignant tumor of kidney ([place])     Current outpatient prescriptions:  .  allopurinol (ZYLOPRIM) 300 MG tablet, Take 300 mg by mouth daily. , Disp: , Rfl:  .  aspirin EC 81 MG tablet, Take 81 mg by mouth daily., Disp: , Rfl:  .  Fluticasone-Salmeterol (ADVAIR DISKUS) 250-50 MCG/DOSE AEPB, inhale 1 dose twice a day, Disp: 60 each, Rfl: 11 .  guaiFENesin (MUCINEX) 600 MG 12 hr tablet, Take 600 mg by mouth 2 (two) times daily as needed., Disp: , Rfl:  .  Magnesium 250 MG TABS, Take 250 mg by mouth daily., Disp: , Rfl:  .  meloxicam (MOBIC) 15 MG tablet, Take 15 mg by mouth  daily. , Disp: , Rfl:  .  Multiple Vitamin (MULTIVITAMIN WITH MINERALS) TABS, Take 1 tablet by mouth daily., Disp: , Rfl:  .  omeprazole (PRILOSEC) 20 MG capsule, Take 20 mg by mouth daily. , Disp: , Rfl:  .  roflumilast (DALIRESP) 500 MCG TABS tablet, take 1 tablet by mouth once daily FOR OTR, Disp: 30 tablet, Rfl: 11 .  vitamin E 400 UNIT capsule, Take 400 Units by mouth daily., Disp: , Rfl:   Review of Systems Dyspnea on exertion, wheezing. No cough, sputum production, hemoptysis. No chest pain, palpitation. No nausea, vomiting, diarrhea, constipation. No fevers, chills, malaise, fatigue. All other review of systems negative    Objective:   Physical Exam Blood pressure 128/68, pulse 65, height 6\' 1"  (1.854 m), weight 196 lb 12.8 oz (89.268 kg), SpO2 91 %. Gen: No apparent distress Neuro: No gross focal deficits. Neck: No JVD, lymphadenopathy, thyromegaly. [id number], no wheeze or crackles CVS: S1-S2 heard, no murmurs rubs gallops. Abdomen: Soft, positive bowel sounds. Extremities: No edema.    Assessment & Plan:  Gold stage C COPD.  His exacerbations have improved after Daliresp was started and is stable on Advair. He does complain of some insomnia for the past 1 year which she thinks is due to [employer]. However I'm reluctant to stop this for the fear of exacerbating his COPD.  He's taking Tylenol PM everyday am concerned about excessive acetaminophen use. He will switch to over-the-counter sleep medication containing just diphenhydramine. I'll also order PFTs since he does not have any recent ones on record.  PLAN: - Continue Advair and daliresp - Switch from tylenol PM to ZZZquil. - PFTs  Return to clinic in 6 months.  Marshell Garfinkel MD [facility] Pager [phone] If no answer or after 3pm call: [phone] 07/24/2015, 3:33 PM

## 2015-07-24 NOTE — Patient Instructions (Signed)
Continue using the daliresp and Advair as prescribed. Stop using Tylenol PM and switch to a medication containing just diphenhydramine.  We will make a return appointment in 6 months. You will be scheduled for lung function tests on the day of the return appointment.

## 2015-10-07 ENCOUNTER — Encounter (HOSPITAL_COMMUNITY)
Admission: RE | Disposition: A | Discharge status: Home / Self Care | Payer: Self-pay | Source: Ambulatory Visit | Attending: Gastroenterology

## 2015-10-07 ENCOUNTER — Ambulatory Visit (HOSPITAL_COMMUNITY)
Admission: RE | Admit: 2015-10-07 | Discharge: 2015-10-07 | Disposition: A | Discharge status: Home / Self Care | Payer: Medicare Other | Source: Ambulatory Visit | Attending: Gastroenterology | Admitting: Gastroenterology

## 2015-10-07 DIAGNOSIS — Z85528 Personal history of other malignant neoplasm of kidney: Secondary | ICD-10-CM | POA: Insufficient documentation

## 2015-10-07 DIAGNOSIS — Z791 Long term (current) use of non-steroidal anti-inflammatories (NSAID): Secondary | ICD-10-CM | POA: Insufficient documentation

## 2015-10-07 DIAGNOSIS — Q2733 Arteriovenous malformation of digestive system vessel: Secondary | ICD-10-CM | POA: Insufficient documentation

## 2015-10-07 DIAGNOSIS — M109 Gout, unspecified: Secondary | ICD-10-CM | POA: Diagnosis not present

## 2015-10-07 DIAGNOSIS — Z87891 Personal history of nicotine dependence: Secondary | ICD-10-CM | POA: Insufficient documentation

## 2015-10-07 DIAGNOSIS — Z96652 Presence of left artificial knee joint: Secondary | ICD-10-CM | POA: Insufficient documentation

## 2015-10-07 DIAGNOSIS — K219 Gastro-esophageal reflux disease without esophagitis: Secondary | ICD-10-CM | POA: Insufficient documentation

## 2015-10-07 DIAGNOSIS — Z905 Acquired absence of kidney: Secondary | ICD-10-CM | POA: Insufficient documentation

## 2015-10-07 DIAGNOSIS — Z7982 Long term (current) use of aspirin: Secondary | ICD-10-CM | POA: Insufficient documentation

## 2015-10-07 DIAGNOSIS — Z79899 Other long term (current) drug therapy: Secondary | ICD-10-CM | POA: Insufficient documentation

## 2015-10-07 DIAGNOSIS — D500 Iron deficiency anemia secondary to blood loss (chronic): Secondary | ICD-10-CM | POA: Insufficient documentation

## 2015-10-07 DIAGNOSIS — R195 Other fecal abnormalities: Secondary | ICD-10-CM | POA: Insufficient documentation

## 2015-10-07 DIAGNOSIS — Z86010 Personal history of colonic polyps: Secondary | ICD-10-CM | POA: Insufficient documentation

## 2015-10-07 DIAGNOSIS — J449 Chronic obstructive pulmonary disease, unspecified: Secondary | ICD-10-CM | POA: Insufficient documentation

## 2015-10-07 HISTORY — PX: GIVENS CAPSULE STUDY: SHX5432

## 2015-10-07 SURGERY — IMAGING PROCEDURE, GI TRACT, INTRALUMINAL, VIA CAPSULE
Anesthesia: LOCAL

## 2015-10-07 SURGICAL SUPPLY — 1 items: TOWEL COTTON PACK 4EA (MISCELLANEOUS) ×4 IMPLANT

## 2015-10-07 NOTE — H&P (Signed)
  Jerome Mays HPI: This is a 75 year old male with a PMH of colonic polyps, COPD, and GERD referred for a capsule endoscopy.  He had a colonoscopy with Dr. 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  No polyps were identified during this repeat procedure.  During a routine testing he was identified to have heme positive stool and a capsule endoscopy was requested.  Past Medical History  Diagnosis Date  . Renal calculus   . Colonic polyp   . COPD (chronic obstructive pulmonary disease) ([place])   . GERD (gastroesophageal reflux disease)   . Gout   . Shortness of breath   . Arthritis   . Pain     LOWER BACK AND LEFT HIP - HX OF PREVIOUS LUMBAR AND CERVICAL SURGERY--PT STATES HE HAS HAD NUMBNESS IN BOTH FEET SINCE HIS BACK SURGERY  . Malignant tumor of kidney [facility])     Past Surgical History  Procedure Laterality Date  . Hand surgery      left  . Knee surgery      right  . Laminectomy      cervical  . Nephrectomy      PT STATES RIGHT KIDNEY WAS REMOVED  . 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 Back surgery      LUMBAR SURGERY  . Eye surgery      BILATERAL CATARACT EXTRACTIONS  . 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 Diabetes Mother   . COPD Brother   . COPD Sister     Social History:  reports that he quit smoking about 20 years ago. His smoking use included Cigarettes and Pipe. He has a 80 pack-year smoking history. He has never used smokeless tobacco. He reports that he does not drink alcohol or use illicit drugs.  Allergies:  Allergies  Allergen Reactions  . Atorvastatin Rash and Other (See Comments)    Passed out    Medications: Scheduled: Continuous:  No results found for this or any previous visit (from the past 24 hour(s)).   No results  found.  ROS:  As stated above in the HPI otherwise negative.  Height 6\' 1"  (1.854 m), weight 89.812 kg (198 lb).    PE: Nursing procedure.  Assessment/Plan: 1) Heme positive stool - I will read the capsule endoscopy for Dr. Earlean Shawl.  Tyrene Nader D 10/07/2015, 8:17 AM

## 2015-10-07 NOTE — Progress Notes (Signed)
Pt ingested capsule at 0740, will return tomorrow morning.  Vista Lawman, RN

## 2015-10-08 ENCOUNTER — Encounter (HOSPITAL_COMMUNITY): Payer: Self-pay | Admitting: Gastroenterology

## 2016-01-29 ENCOUNTER — Other Ambulatory Visit: Payer: Self-pay | Admitting: *Deleted

## 2016-01-29 MED ORDER — ROFLUMILAST 500 MCG PO TABS: ORAL_TABLET | ORAL | 11 refills | Status: DC

## 2016-02-04 ENCOUNTER — Encounter (INDEPENDENT_AMBULATORY_CARE_PROVIDER_SITE_OTHER): Payer: Self-pay

## 2016-02-04 ENCOUNTER — Ambulatory Visit (INDEPENDENT_AMBULATORY_CARE_PROVIDER_SITE_OTHER): Payer: Medicare Other | Admitting: Pulmonary Disease

## 2016-02-04 ENCOUNTER — Encounter (INDEPENDENT_AMBULATORY_CARE_PROVIDER_SITE_OTHER): Payer: Medicare Other | Admitting: Pulmonary Disease

## 2016-02-04 ENCOUNTER — Encounter: Payer: Self-pay | Admitting: Pulmonary Disease

## 2016-02-04 VITALS — BP 110/58 | HR 65 | Ht 72.0 in | Wt 190.0 lb

## 2016-02-04 DIAGNOSIS — J449 Chronic obstructive pulmonary disease, unspecified: Secondary | ICD-10-CM

## 2016-02-04 LAB — PULMONARY FUNCTION TEST
DL/VA % pred: 41 %
DL/VA: 1.94 ml/min/mmHg/L
DLCO COR: 13.15 ml/min/mmHg
DLCO UNC % PRED: 35 %
DLCO cor % pred: 37 %
DLCO unc: 12.6 ml/min/mmHg
FEF 25-75 Post: 1.53 L/sec
FEF 25-75 Pre: 1.1 L/sec
FEF2575-%Change-Post: 39 %
FEF2575-%Pred-Post: 65 %
FEF2575-%Pred-Pre: 47 %
FEV1-%Change-Post: 11 %
FEV1-%PRED-POST: 82 %
FEV1-%PRED-PRE: 73 %
FEV1-POST: 2.68 L
FEV1-PRE: 2.4 L
FEV1FVC-%Change-Post: 2 %
FEV1FVC-%Pred-Pre: 82 %
FEV6-%CHANGE-POST: 7 %
FEV6-%PRED-PRE: 92 %
FEV6-%Pred-Post: 98 %
FEV6-PRE: 3.9 L
FEV6-Post: 4.19 L
FEV6FVC-%CHANGE-POST: -2 %
FEV6FVC-%PRED-PRE: 103 %
FEV6FVC-%Pred-Post: 101 %
FVC-%Change-Post: 8 %
FVC-%Pred-Post: 97 %
FVC-%Pred-Pre: 89 %
FVC-Post: 4.4 L
FVC-Pre: 4.05 L
POST FEV6/FVC RATIO: 95 %
Post FEV1/FVC ratio: 61 %
Pre FEV1/FVC ratio: 59 %
Pre FEV6/FVC Ratio: 98 %
RV % PRED: 106 %
RV: 2.88 L
TLC % PRED: 101 %
TLC: 7.58 L

## 2016-02-04 NOTE — Patient Instructions (Signed)
Continue using the Advair and daliresp  Return to clinic in 6 months.

## 2016-02-04 NOTE — Progress Notes (Signed)
FREDERIC DULEY    [id number]    [birth year]  Primary Care Physician:ARONSON,RICHARD A, MD  Referring Physician: [name], MD [address] [place], [place] [address]  Chief complaint: Follow-up for COPD.  HPI: Mr Stones is a 75 Y/O with Gold stage C COPD. He is a former patient of Dr. Joya Gaskins. He's been maintained on Advair and Daliresp. He's had frequent exacerbations in the past but these have improved after the Daliresp was added.   He has had symptoms of cough, chest tightness, congestion for the past week after he was exposed to pollen and dust while mowing hay. He denies any fevers, chills, purulent sputum. He feels that the Daliresp is affecting his sleep and he is taking zzquil at night to fall asleep.  Outpatient Encounter Prescriptions as of 02/04/2016  Medication Sig  . allopurinol (ZYLOPRIM) 300 MG tablet Take 300 mg by mouth daily.   . Fluticasone-Salmeterol (ADVAIR DISKUS) 250-50 MCG/DOSE AEPB inhale 1 dose twice a day  . guaiFENesin (MUCINEX) 600 MG 12 hr tablet Take 600 mg by mouth 2 (two) times daily as needed.  . Iron-FA-B Cmp-C-Biot-Probiotic (FUSION PLUS PO) Take by mouth daily.  . Magnesium 250 MG TABS Take 250 mg by mouth daily.  . Misc Natural Products (TART CHERRY ADVANCED PO) Take 2 tablets by mouth daily.  . Multiple Vitamin (MULTIVITAMIN WITH MINERALS) TABS Take 1 tablet by mouth daily.  . roflumilast (DALIRESP) 500 MCG TABS tablet take 1 tablet by mouth once daily FOR OTR  . vitamin E 400 UNIT capsule Take 400 Units by mouth daily.  [name] aspirin EC 81 MG tablet Take 81 mg by mouth daily.  . meloxicam (MOBIC) 15 MG tablet Take 15 mg by mouth daily.   [name] omeprazole (PRILOSEC) 20 MG capsule Take 20 mg by mouth daily.    No facility-administered encounter medications on file as of 02/04/2016.     Allergies as of 02/04/2016 - Review Complete 02/04/2016  Allergen Reaction Noted  . Atorvastatin Rash and Other (See Comments)      Past Medical  History:  Diagnosis Date  . Arthritis   . Colonic polyp   . COPD (chronic obstructive pulmonary disease) ([place])   . GERD (gastroesophageal reflux disease)   . Gout   . Malignant tumor of kidney ([place])   . Pain    LOWER BACK AND LEFT HIP - HX OF PREVIOUS LUMBAR AND CERVICAL SURGERY--PT STATES HE HAS HAD NUMBNESS IN BOTH FEET SINCE HIS BACK SURGERY  . Renal calculus   . Shortness of breath     Past Surgical History:  Procedure Laterality Date  . BACK SURGERY     LUMBAR SURGERY  . EYE SURGERY     BILATERAL CATARACT EXTRACTIONS  . GIVENS CAPSULE STUDY N/A 10/07/2015   Procedure: GIVENS CAPSULE STUDY;  Surgeon: Carol Ada, MD;  Location: [facility];  Service: Endoscopy;  Laterality: N/A;  . HAND SURGERY     left  . KNEE SURGERY     right  . LAMINECTOMY     cervical  . NEPHRECTOMY     PT STATES RIGHT KIDNEY WAS REMOVED  . 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 TOTAL KNEE ARTHROPLASTY Left 10/19/2012   Procedure: LEFT TOTAL KNEE ARTHROPLASTY;  Surgeon: Magnus Sinning, MD;  [facility];  Service: Orthopedics;  Laterality: Left;    Family History  Problem Relation Age of Onset  . Diabetes Mother   . COPD Brother   .  COPD Sister     Social History   Social History  . Marital status: Married    Spouse name: N/A  . Number of children: N/A  . Years of education: N/A   Occupational History  . Not on file.   Social History Main Topics  . Smoking status: Former Smoker    Packs/day: 2.00    Years: 40.00    Types: Cigarettes, Pipe    Quit date: 06/09/1995  . Smokeless tobacco: Never Used  . Alcohol use No  . Drug use: No  . Sexual activity: Not on file   Other Topics Concern  . Not on file   Social History Narrative  . No narrative on file   Review of systems: Review of Systems  Constitutional: Negative for fever and chills.  HENT: Negative.   Eyes: Negative for blurred vision.  Respiratory: as per HPI  Cardiovascular: Negative for chest pain and  palpitations.  Gastrointestinal: Negative for vomiting, diarrhea, blood per rectum. Genitourinary: Negative for dysuria, urgency, frequency and hematuria.  Musculoskeletal: Negative for myalgias, back pain and joint pain.  Skin: Negative for itching and rash.  Neurological: Negative for dizziness, tremors, focal weakness, seizures and loss of consciousness.  Endo/Heme/Allergies: Negative for environmental allergies.  Psychiatric/Behavioral: Negative for depression, suicidal ideas and hallucinations.  All other systems reviewed and are negative.   Physical Exam: Blood pressure (!) 110/58, pulse 65, height 6' (1.829 m), weight 190 lb (86.2 kg), SpO2 93 %. Gen:      No acute distress HEENT:  EOMI, sclera anicteric Neck:     No masses; no thyromegaly Lungs:    Clear to auscultation bilaterally; normal respiratory effort CV:         Regular rate and rhythm; no murmurs Abd:      + bowel sounds; soft, non-tender; no palpable masses, no distension Ext:    No edema; adequate peripheral perfusion Skin:      Warm and dry; no rash Neuro: alert and oriented x 3 Psych: normal mood and affect  Data Reviewed: CT scan 10/21/09 Moderate sentry lobar emphysema. No pulmonary masses, opacities, asbestos disease or consolidation. Stable enlarged. Stable enlarged mediastinal and bilateral hilar lymph nodes unchanged for past 3.5 years compatible with benign or reactive process.  PFTs 02/04/16 FVC 4.05 [9%) FEV1 2.40 (70%) F/F 59 TLC 101% DLCO 35% Moderate restrictive defect with severe reduction in diffusion capacity.  Assessment:  Gold stage C COPD.  His exacerbations have improved after Daliresp was started and is stable on Advair. He does complain of some cough, chest tightness for the past 1 week. He is not wheezing in office today and I don't think he will need antibiotics or steroids. He has insomnia which he thinks is due to [employer]. However I'm reluctant to stop this for the fear of  exacerbating his COPD.   Plan/Recommendations: - Continue Advair and daliresp  Marshell Garfinkel MD [facility] Pager [phone] 02/04/2016, 10:52 AM  CC: [name], MD

## 2016-02-07 ENCOUNTER — Telehealth: Payer: Self-pay | Admitting: Pulmonary Disease

## 2016-02-07 ENCOUNTER — Other Ambulatory Visit: Payer: Self-pay | Admitting: Pulmonary Disease

## 2016-02-07 DIAGNOSIS — N401 Enlarged prostate with lower urinary tract symptoms: Secondary | ICD-10-CM | POA: Insufficient documentation

## 2016-02-07 NOTE — Telephone Encounter (Signed)
LVM for pt to return call

## 2016-02-11 MED ORDER — FLUTICASONE-SALMETEROL 250-50 MCG/DOSE IN AEPB: INHALATION_SPRAY | RESPIRATORY_TRACT | 11 refills | Status: DC

## 2016-02-11 NOTE — Telephone Encounter (Signed)
Refill has been sent in to the pharmacy for this pt. Nothing further is needed.

## 2016-02-11 NOTE — Telephone Encounter (Signed)
Patient wife called back and states that patient has been out of his Advair for 2 days and he needs refill -pr

## 2016-06-17 ENCOUNTER — Other Ambulatory Visit: Payer: Self-pay | Admitting: Orthopedic Surgery

## 2016-06-17 DIAGNOSIS — M5136 Other intervertebral disc degeneration, lumbar region: Secondary | ICD-10-CM

## 2016-06-18 ENCOUNTER — Ambulatory Visit (INDEPENDENT_AMBULATORY_CARE_PROVIDER_SITE_OTHER): Payer: Medicare Other | Admitting: Pulmonary Disease

## 2016-06-18 ENCOUNTER — Encounter: Payer: Self-pay | Admitting: Pulmonary Disease

## 2016-06-18 DIAGNOSIS — J449 Chronic obstructive pulmonary disease, unspecified: Secondary | ICD-10-CM | POA: Diagnosis not present

## 2016-06-18 DIAGNOSIS — R042 Hemoptysis: Secondary | ICD-10-CM | POA: Diagnosis not present

## 2016-06-18 NOTE — Addendum Note (Signed)
Addended by: Valerie Salts on: 06/18/2016 12:01 PM   Modules accepted: Orders

## 2016-06-18 NOTE — Assessment & Plan Note (Signed)
Stable on Advair and Daliresp

## 2016-06-18 NOTE — Patient Instructions (Addendum)
CT chest without contrast Keep appointment with Dr. Vaughan Browner in February

## 2016-06-18 NOTE — Progress Notes (Signed)
   Subjective:    Patient ID: Jerome Mays, male    DOB: Jan 11, 1941, 76 y.o.   MRN: [id number]  HPI  76 year old with moderate COPD for acute visit  Chief Complaint  Patient presents with  . Cough    Woke up this morning and noticed yellow phlegm with a small amount of blood. Denies body aches and chills.     He is maintained on a regimen of Advair and Daliresp for COPD  he reports blood-tinged sputum for one week, occasionally bright red but mostly dark sputum . He also reports some bleeding when he blows his nose really hard   he has been suffering from sciatica for the last few weeks and uses a walker now. He underwent spinal injections and a myelogram is planned next week  His breathing is otherwise stable, denies wheezing, URI or sick contacts   CT angiogram 04/2006 shows moderate emphysema, mediastinal and right hilar lymphadenopathy   follow-up CT scan in 2011 showed stable mediastinal lymphadenopathy, these were noted to be PET positive in 08/2009  Past Medical History:  Diagnosis Date  . Arthritis   . Colonic polyp   . COPD (chronic obstructive pulmonary disease) ([place])   . GERD (gastroesophageal reflux disease)   . Gout   . Malignant tumor of kidney ([place])   . Pain    LOWER BACK AND LEFT HIP - HX OF PREVIOUS LUMBAR AND CERVICAL SURGERY--PT STATES HE HAS HAD NUMBNESS IN BOTH FEET SINCE HIS BACK SURGERY  . Renal calculus   . Shortness of breath      Review of Systems neg for any significant sore throat, dysphagia, itching, sneezing, nasal congestion or excess/ purulent secretions, fever, chills, sweats, unintended wt loss, pleuritic or exertional cp,  orthopnea pnd or change in chronic leg swelling. Also denies presyncope, palpitations, heartburn, abdominal pain, nausea, vomiting, diarrhea or change in bowel or urinary habits, dysuria,hematuria, rash, arthralgias, visual complaints, headache, numbness weakness or ataxia.     Objective:   Physical Exam   Gen.  Pleasant, well-nourished, in no distress ENT - no lesions, no post nasal drip Neck: No JVD, no thyromegaly, no carotid bruits Lungs: no use of accessory muscles, no dullness to percussion, clear without rales or rhonchi  Cardiovascular: Rhythm regular, heart sounds  normal, no murmurs or gallops, no peripheral edema Musculoskeletal: No deformities, no cyanosis or clubbing         Assessment & Plan:

## 2016-06-18 NOTE — Assessment & Plan Note (Signed)
No etiology apparent- this could be related to epistaxis but he doesn't give a definite history of nasal bleeding  Prior history mediastinal lymphadenopathy which has been stable from 2007/ to 2011 Given his long history of smoking and moderate emphysema, would suggest proceeding with CT chest  If this is normal we can wait and observe

## 2016-06-19 ENCOUNTER — Ambulatory Visit (INDEPENDENT_AMBULATORY_CARE_PROVIDER_SITE_OTHER)
Admission: RE | Admit: 2016-06-19 | Discharge: 2016-06-19 | Disposition: A | Discharge status: Home / Self Care | Payer: Medicare Other | Source: Ambulatory Visit | Attending: Pulmonary Disease | Admitting: Pulmonary Disease

## 2016-06-19 DIAGNOSIS — R042 Hemoptysis: Secondary | ICD-10-CM

## 2016-06-19 DIAGNOSIS — J449 Chronic obstructive pulmonary disease, unspecified: Secondary | ICD-10-CM | POA: Diagnosis not present

## 2016-06-23 ENCOUNTER — Ambulatory Visit
Admission: RE | Admit: 2016-06-23 | Discharge: 2016-06-23 | Disposition: A | Discharge status: Home / Self Care | Payer: Medicare Other | Source: Ambulatory Visit | Attending: Orthopedic Surgery | Admitting: Orthopedic Surgery

## 2016-06-23 ENCOUNTER — Other Ambulatory Visit: Payer: Self-pay | Admitting: Orthopedic Surgery

## 2016-06-23 ENCOUNTER — Ambulatory Visit
Admission: RE | Admit: 2016-06-23 | Discharge: 2016-06-23 | Disposition: A | Discharge status: Home / Self Care | Payer: Self-pay | Source: Ambulatory Visit | Attending: Orthopedic Surgery | Admitting: Orthopedic Surgery

## 2016-06-23 DIAGNOSIS — M5136 Other intervertebral disc degeneration, lumbar region: Secondary | ICD-10-CM

## 2016-06-23 MED ORDER — IOPAMIDOL (ISOVUE-M 200) INJECTION 41%
15.0000 mL | Freq: Once | INTRAMUSCULAR | Status: AC
  Administered 2016-06-23: 15 mL via INTRATHECAL

## 2016-06-23 MED ORDER — HYDROMORPHONE HCL 4 MG PO TABS
4.0000 mg | ORAL_TABLET | Freq: Once | ORAL | Status: AC
  Administered 2016-06-23: 4 mg via ORAL

## 2016-06-23 MED ORDER — DIAZEPAM 5 MG PO TABS
5.0000 mg | ORAL_TABLET | Freq: Once | ORAL | Status: AC
  Administered 2016-06-23: 5 mg via ORAL

## 2016-06-23 NOTE — Discharge Instructions (Signed)

## 2016-08-03 ENCOUNTER — Encounter: Payer: Self-pay | Admitting: Pulmonary Disease

## 2016-08-03 ENCOUNTER — Ambulatory Visit (INDEPENDENT_AMBULATORY_CARE_PROVIDER_SITE_OTHER): Payer: Medicare Other | Admitting: Pulmonary Disease

## 2016-08-03 VITALS — BP 110/72 | HR 56 | Ht 72.0 in | Wt 183.4 lb

## 2016-08-03 DIAGNOSIS — R918 Other nonspecific abnormal finding of lung field: Secondary | ICD-10-CM | POA: Diagnosis not present

## 2016-08-03 NOTE — Patient Instructions (Addendum)
Continue Advair and daliresp We will get a repeat CT of chest without contrast in 1 year  Return in 6 months.

## 2016-08-03 NOTE — Progress Notes (Addendum)
Jerome Mays    [id number]    07-Jul-1940  Primary Care Physician:ARONSON,RICHARD A, MD  Referring Physician: [name], MD [address] [place], [place] [address]  Chief complaint: Follow-up for COPD.  HPI: Jerome Mays is a 76 Y/O with Gold stage C COPD. He is a former patient of Dr. Joya Gaskins. He's been maintained on Advair and Daliresp. He's had frequent exacerbations in the past but these have improved after the Daliresp was added.   He has had symptoms of cough, chest tightness, congestion for the past week after he was exposed to pollen and dust while mowing hay. He denies any fevers, chills, purulent sputum. He feels that the Daliresp is affecting his sleep and he is taking zzquil at night to fall asleep.  Interim History: He had an evaluation in January for minor hemoptysis. He had a CT scan of the chest which did not show any obvious source of hemoptysis. Today in the office he feels well. He has dyspnea on exertion at baseline. Denies any cough, sputum, chest pain, palpitation, fevers, chills.  Outpatient Encounter Prescriptions as of 08/03/2016  Medication Sig  . allopurinol (ZYLOPRIM) 300 MG tablet Take 300 mg by mouth daily.   . Fluticasone-Salmeterol (ADVAIR DISKUS) 250-50 MCG/DOSE AEPB inhale 1 dose twice a day  . guaiFENesin (MUCINEX) 600 MG 12 hr tablet Take 600 mg by mouth 2 (two) times daily as needed.  . Multiple Vitamin (MULTIVITAMIN WITH MINERALS) TABS Take 1 tablet by mouth daily.  . roflumilast (DALIRESP) 500 MCG TABS tablet take 1 tablet by mouth once daily FOR OTR  . [DISCONTINUED] Iron-FA-B Cmp-C-Biot-Probiotic (FUSION PLUS PO) Take by mouth daily.  . [DISCONTINUED] Misc Natural Products (TART CHERRY ADVANCED PO) Take 2 tablets by mouth daily.  . [DISCONTINUED] oxyCODONE-acetaminophen (PERCOCET) 10-325 MG tablet Take 1 tablet by mouth every 4 (four) hours as needed for pain (every 4-6 hours).   No facility-administered encounter medications on  file as of 08/03/2016.     Allergies as of 08/03/2016 - Review Complete 08/03/2016  Allergen Reaction Noted  . Lipitor [atorvastatin] Rash and Other (See Comments)      Past Medical History:  Diagnosis Date  . Arthritis   . Colonic polyp   . COPD (chronic obstructive pulmonary disease) ([place])   . GERD (gastroesophageal reflux disease)   . Gout   . Malignant tumor of kidney ([place])   . Pain    LOWER BACK AND LEFT HIP - HX OF PREVIOUS LUMBAR AND CERVICAL SURGERY--PT STATES HE HAS HAD NUMBNESS IN BOTH FEET SINCE HIS BACK SURGERY  . Renal calculus   . Shortness of breath     Past Surgical History:  Procedure Laterality Date  . BACK SURGERY     LUMBAR SURGERY  . EYE SURGERY     BILATERAL CATARACT EXTRACTIONS  . GIVENS CAPSULE STUDY N/A 10/07/2015   Procedure: GIVENS CAPSULE STUDY;  Surgeon: Carol Ada, MD;  Location: [facility];  Service: Endoscopy;  Laterality: N/A;  . HAND SURGERY     left  . KNEE SURGERY     right  . LAMINECTOMY     cervical  . NEPHRECTOMY     PT STATES RIGHT KIDNEY WAS REMOVED  . 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 TOTAL KNEE ARTHROPLASTY Left 10/19/2012   Procedure: LEFT TOTAL KNEE ARTHROPLASTY;  Surgeon: Magnus Sinning, MD;  [facility];  Service: Orthopedics;  Laterality: Left;    Family History  Problem  Relation Age of Onset  . Diabetes Mother   . COPD Brother   . COPD Sister     Social History   Social History  . Marital status: Married    Spouse name: N/A  . Number of children: N/A  . Years of education: N/A   Occupational History  . Not on file.   Social History Main Topics  . Smoking status: Former Smoker    Packs/day: 2.00    Years: 40.00    Types: Cigarettes, Pipe    Quit date: 06/09/1995  . Smokeless tobacco: Never Used  . Alcohol use No  . Drug use: No  . Sexual activity: Not on file   Other Topics Concern  . Not on file   Social History Narrative  . No narrative on file   Review of systems: Review of  Systems  Constitutional: Negative for fever and chills.  HENT: Negative.   Eyes: Negative for blurred vision.  Respiratory: as per HPI  Cardiovascular: Negative for chest pain and palpitations.  Gastrointestinal: Negative for vomiting, diarrhea, blood per rectum. Genitourinary: Negative for dysuria, urgency, frequency and hematuria.  Musculoskeletal: Negative for myalgias, back pain and joint pain.  Skin: Negative for itching and rash.  Neurological: Negative for dizziness, tremors, focal weakness, seizures and loss of consciousness.  Endo/Heme/Allergies: Negative for environmental allergies.  Psychiatric/Behavioral: Negative for depression, suicidal ideas and hallucinations.  All other systems reviewed and are negative.  Physical Exam: Blood pressure 110/72, pulse (!) 56, height 6' (1.829 m), weight 83.2 kg (183 lb 6.4 oz), SpO2 94 %. Gen:      No acute distress HEENT:  EOMI, sclera anicteric Neck:     No masses; no thyromegaly Lungs:    Clear to auscultation bilaterally; normal respiratory effort CV:         Regular rate and rhythm; no murmurs Abd:      + bowel sounds; soft, non-tender; no palpable masses, no distension Ext:    No edema; adequate peripheral perfusion Skin:      Warm and dry; no rash Neuro: alert and oriented x 3 Psych: normal mood and affect  Data Reviewed: CT scan 10/21/09 Moderate sentry lobar emphysema. No pulmonary masses, opacities, asbestos disease or consolidation. Stable enlarged. Stable enlarged mediastinal and bilateral hilar lymph nodes unchanged for past 3.5 years compatible with benign or reactive process.  CT scan 06/19/16-emphysema, no consolidation or lung opacity. Mediastinal and bilateral hilar lymphadenopathy stable in size. Clustered left upper lobe nodules. I have reviewed all images personally  PFTs 02/04/16 FVC 4.05 [9%) FEV1 2.40 (70%) F/F 59 TLC 101% DLCO 35% Moderate restrictive defect with severe reduction in diffusion  capacity.  Assessment:  Gold stage C COPD. His exacerbations have improved after Daliresp was started and is stable on Advair. He does complain of some cough, chest tightness for the past 1 week. He is not wheezing in office today and I don't think he will need antibiotics or steroids. He has insomnia which he thinks is due to [employer]. However I'm reluctant to stop this for the fear of exacerbating his COPD.   Minor hemoptysis, 1 episode last month. He has no recurrence of hemoptysis and no obvious source on CT scan. He has new tiny left upper lobe nodules which will be followed up with repeat CT in 1 year.  Plan/Recommendations: - Continue Advair and daliresp - CT without contrast in 1 year.   Marshell Garfinkel MD [facility] Pager [phone] 08/06/2016,  12:09 AM  CC: [name], MD

## 2016-09-24 ENCOUNTER — Telehealth: Payer: Self-pay | Admitting: Pulmonary Disease

## 2016-09-24 NOTE — Telephone Encounter (Signed)
Called and spoke with pts wife and she stated that the pt cannot afford the daliresp due to the insurance not covering this med.  She stated that if this could be changed to a generic or they will have to change the medication completely.  PM please advise. Thanks  Allergies  Allergen Reactions  . Lipitor [Atorvastatin] Rash and Other (See Comments)    Passed out

## 2016-09-25 NOTE — Telephone Encounter (Signed)
There is no generic substitute for daliresp. Can we do a PA or patient assistance?  Rozella Servello

## 2016-09-25 NOTE — Telephone Encounter (Signed)
Called pharmacy to see if a PA was needed for [place]- pharmacist verified that rx is covered by insurance and is a $40 copay for a 30 day supply.   Can try to apply for financial assistance through manufacturer if copay is too high for patient. lmtcb X1 for pt to see if they are willing to fill out pt assistance application.

## 2016-09-28 NOTE — Telephone Encounter (Signed)
Patient wife [name] called back - gave her the information on the Daliresp coverage. Patient states that she failed to tell us that patient's insurance is going to change as of 10/06/2016 and that new insurance will not cover this med. Pt wife can be reached at [phone] -pr

## 2016-09-28 NOTE — Telephone Encounter (Signed)
Spoke with pt's wife, states that [employer] will not go into effect until 11/05/2016.  I advised pt's wife that we cannot do any prior auths on this med until it is active.  I also offered manufacturer pt assistance forms, which pt and wife would like to apply for.  Application mailed to pt at address on file.  Pt will call when his new insurance goes into effect and has been advised to pick up a rx of daliresp on current insurance before his insurance changes.  Nothing further needed at this time

## 2016-09-28 NOTE — Telephone Encounter (Signed)
Patient wife called again and wanted [place] to know that the new insurance will be [place] - She can be reached at [phone]

## 2016-12-21 ENCOUNTER — Telehealth: Payer: Self-pay | Admitting: Pulmonary Disease

## 2016-12-21 NOTE — Telephone Encounter (Signed)
Pt wife calling wanting to know if pt med can be changed to a generi brand she can be reached @ [phone], please advise.Hillery Hunter

## 2016-12-21 NOTE — Telephone Encounter (Signed)
Attempted to contact pt. No answer, no option to leave a message. Will try back.  

## 2016-12-21 NOTE — Telephone Encounter (Addendum)
Called pt's insurance and they stated both medications are covered but patient is in the donut hole and his copay will be high. They could not offer me any alternatives. She stated that if the patient call them they can provide them with different phone numbers for patient assistant.  Spoke with wife and she states they will reach out to their insurance company to get these numbers and she is aware that if needed as far as patient's inhaler we can provide samples if we have it. She will call us back if she needs anything additional from [place]. Nothing further is needed  Phone note from 4/19 states some patient assistant forms were mailed to patient

## 2016-12-22 NOTE — Telephone Encounter (Signed)
ATC pt back at [phone], automatic voice stated "your call could not be completed as dialed"

## 2016-12-23 NOTE — Telephone Encounter (Signed)
Attempted to contact pt and his wife. Received a message that my call could not be completed as dialed x2. Per triage protocol, message will be closed.

## 2017-01-30 ENCOUNTER — Other Ambulatory Visit: Payer: Self-pay | Admitting: Pulmonary Disease

## 2017-02-01 ENCOUNTER — Ambulatory Visit: Payer: Medicare Other | Admitting: Pulmonary Disease

## 2017-02-02 ENCOUNTER — Ambulatory Visit (INDEPENDENT_AMBULATORY_CARE_PROVIDER_SITE_OTHER): Payer: Medicare Other | Admitting: Pulmonary Disease

## 2017-02-02 ENCOUNTER — Encounter: Payer: Self-pay | Admitting: Pulmonary Disease

## 2017-02-02 VITALS — BP 124/54 | HR 67 | Ht 73.0 in | Wt 184.2 lb

## 2017-02-02 DIAGNOSIS — R042 Hemoptysis: Secondary | ICD-10-CM

## 2017-02-02 DIAGNOSIS — R918 Other nonspecific abnormal finding of lung field: Secondary | ICD-10-CM

## 2017-02-02 DIAGNOSIS — J449 Chronic obstructive pulmonary disease, unspecified: Secondary | ICD-10-CM | POA: Insufficient documentation

## 2017-02-02 NOTE — Progress Notes (Signed)
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 He's been maintained on Advair and Daliresp. He's had frequent exacerbations in the past but these have improved after the Daliresp was added.   He has had symptoms of cough, chest tightness, congestion for the past week after he was exposed to pollen and dust while mowing hay. He denies any fevers, chills, purulent sputum. He feels that the Daliresp is affecting his sleep and he is taking zzquil at night to fall asleep. He had an evaluation in January 2018 for minor hemoptysis. He had a CT scan of the chest which did not show any obvious source of hemoptysis.   Interim History: His symptoms today are at baseline with chronic cough with sputum production. Denies any dyspnea, wheezing. There have not been any exacerbations or ER visits. He feels that the [place] co-pay is high and he cannot afford it. He is wondering if there are any other alternate medication.  Outpatient Encounter Prescriptions as of 02/02/2017  Medication Sig  . allopurinol (ZYLOPRIM) 300 MG tablet Take 300 mg by mouth daily.   [name] DALIRESP 500 MCG TABS tablet TAKE 1 TABLET BY MOUTH ONCE DAILY  . Fluticasone-Salmeterol (ADVAIR DISKUS) 250-50 MCG/DOSE AEPB inhale 1 dose twice a day  . guaiFENesin (MUCINEX) 600 MG 12 hr tablet Take 600 mg by mouth 2 (two) times daily as needed.  . Multiple Vitamin (MULTIVITAMIN WITH MINERALS) TABS Take 1 tablet by mouth daily.   No facility-administered encounter medications on file as of 02/02/2017.     Allergies as of 02/02/2017 - Review Complete 02/02/2017  Allergen Reaction Noted  . Lipitor [atorvastatin] Rash and Other (See Comments)      Past Medical History:    Diagnosis Date  . Arthritis   . Colonic polyp   . COPD (chronic obstructive pulmonary disease) ([place])   . GERD (gastroesophageal reflux disease)   . Gout   . Malignant tumor of kidney ([place])   . Pain    LOWER BACK AND LEFT HIP - HX OF PREVIOUS LUMBAR AND CERVICAL SURGERY--PT STATES HE HAS HAD NUMBNESS IN BOTH FEET SINCE HIS BACK SURGERY  . Renal calculus   . Shortness of breath     Past Surgical History:  Procedure Laterality Date  . BACK SURGERY     LUMBAR SURGERY  . EYE SURGERY     BILATERAL CATARACT EXTRACTIONS  . GIVENS CAPSULE STUDY N/A 10/07/2015   Procedure: GIVENS CAPSULE STUDY;  Surgeon: Carol Ada, MD;  Location: [facility];  Service: Endoscopy;  Laterality: N/A;  . HAND SURGERY     left  . KNEE SURGERY     right  . LAMINECTOMY     cervical  . NEPHRECTOMY     PT STATES RIGHT KIDNEY WAS REMOVED  . 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 TOTAL KNEE ARTHROPLASTY Left 10/19/2012   Procedure: LEFT TOTAL KNEE ARTHROPLASTY;  Surgeon: Magnus Sinning, MD;  Location: [facility];  Service: Orthopedics;  Laterality: Left;    Family History  Problem Relation Age of Onset  . Diabetes Mother   . COPD Brother   . COPD Sister     Social History   Social History  .  Marital status: Married    Spouse name: N/A  . Number of children: N/A  . Years of education: N/A   Occupational History  . Not on file.   Social History Main Topics  . Smoking status: Former Smoker    Packs/day: 2.00    Years: 40.00    Types: Cigarettes, Pipe    Quit date: 06/09/1995  . Smokeless tobacco: Never Used  . Alcohol use No  . Drug use: No  . Sexual activity: Not on file   Other Topics Concern  . Not on file   Social History Narrative  . No narrative on file   Review of systems: Review of Systems  Constitutional: Negative for fever and chills.  HENT: Negative.   Eyes: Negative for blurred vision.  Respiratory: as per HPI  Cardiovascular: Negative for chest pain and palpitations.   Gastrointestinal: Negative for vomiting, diarrhea, blood per rectum. Genitourinary: Negative for dysuria, urgency, frequency and hematuria.  Musculoskeletal: Negative for myalgias, back pain and joint pain.  Skin: Negative for itching and rash.  Neurological: Negative for dizziness, tremors, focal weakness, seizures and loss of consciousness.  Endo/Heme/Allergies: Negative for environmental allergies.  Psychiatric/Behavioral: Negative for depression, suicidal ideas and hallucinations.  All other systems reviewed and are negative.  Physical Exam: Blood pressure (!) 124/54, pulse 67, height 6\' 1"  (1.854 m), weight 83.6 kg (184 lb 3.2 oz), SpO2 94 %. Gen:      No acute distress HEENT:  EOMI, sclera anicteric Neck:     No masses; no thyromegaly Lungs:    Clear to auscultation bilaterally; normal respiratory effort CV:         Regular rate and rhythm; no murmurs Abd:      + bowel sounds; soft, non-tender; no palpable masses, no distension Ext:    No edema; adequate peripheral perfusion Skin:      Warm and dry; no rash Neuro: alert and oriented x 3 Psych: normal mood and affect  Data Reviewed: CT scan 10/21/09 Moderate centrilobar emphysema. No pulmonary masses, opacities, asbestos disease or consolidation. Stable enlarged. Stable enlarged mediastinal and bilateral hilar lymph nodes unchanged for past 3.5 years compatible with benign or reactive process.  CT scan 06/19/16-emphysema, no consolidation or lung opacity. Mediastinal and bilateral hilar lymphadenopathy stable in size. Clustered left upper lobe nodules. I have reviewed all images personally  PFTs 02/04/16 FVC 4.05 [9%) FEV1 2.40 (70%) F/F 59 TLC 101% DLCO 35% Moderate restrictive defect with severe reduction in diffusion capacity.  Assessment:  Gold stage B COPD. Symptoms have remained stable on Advair and Daliresp. Since he cannot afford the co-pay for the Daliresp I think it would be reasonable to take him off and monitor  symptoms. If he does have worsening dyspnea then as a first step I would change the Advair to LABA/LAMA combination with anoro before restarting the daliresp. He'll get the high-dose Flu vaccination when it is available.  Minor hemoptysis, in early 2018 Subcm pulm nodules He has no recurrence of hemoptysis and no obvious source on CT scan. He has new tiny left upper lobe nodules which will be followed up with repeat CT in 1 year (due in jan 2019)  Plan/Recommendations: - Continue Advair. Stop daliresp and monitor symptoms - CT without contrast follow up  Marshell Garfinkel MD [facility] Pager [phone] 02/02/2017, 2:04 PM  CC: [name], MD

## 2017-02-02 NOTE — Patient Instructions (Signed)
Please stop the daliresp and monitor symptoms Please call and let us know if there is any worsening of dyspnea and we can consider switching the advair to anoro. Continue the advair for now  You will need a follow up CT scan around jan of 2019 Return in 6 months

## 2017-02-16 NOTE — Progress Notes (Addendum)
[facility] Note  02/17/2017     CHIEF COMPLAINT Patient presents for Retina Evaluation   HISTORY OF PRESENT ILLNESS: Jerome Mays is a 76 y.o. male who presents to the clinic today for:   HPI    Retina Evaluation  In left eye.  This started 5 weeks ago.  Duration of 5 weeks.  Associated Symptoms Floaters and Blind Spot.  Negative for Flashes, Distortion, Pain, Redness, Trauma, Shoulder/Hip pain, Fatigue, Jaw Claudication, Glare, Weight Loss, Scalp Tenderness, Photophobia and Fever.  Context:  distance vision, mid-range vision and near vision.  Treatments tried include no treatments.        Comments  Pt presents on the referral of Dr. Richardean Sale for concern of RD OS; Pt states that he began to see many floaters "looked like a bunch of pepper" x 5 weeks ago; Pt states that [place] progressively got worse; Pt reports that now he is only able to see "lower half" of everything; Pt reports using systane OU prn; Pt denies taking eye vits;      Last edited by Alyse Low on 02/17/2017  8:00 AM. (History)      Referring physician: Rutherford Guys, MD [place], [place] [address]  HISTORICAL INFORMATION:   Selected notes from the MEDICAL RECORD NUMBER Referral from Dr. [name] for possible RD OS   CURRENT MEDICATIONS: No current outpatient prescriptions on file. (Ophthalmic Drugs)   No current facility-administered medications for this visit.  (Ophthalmic Drugs)   No current outpatient prescriptions on file. (Other)   No current facility-administered medications for this visit.  (Other)      REVIEW OF SYSTEMS: ROS    Positive for: Eyes   Negative for: Constitutional, Gastrointestinal, Neurological, Skin, Genitourinary, Musculoskeletal, HENT, Endocrine, Cardiovascular, Respiratory, Psychiatric, Allergic/Imm, Heme/Lymph   Last edited by Alyse Low on 02/17/2017  8:00 AM. (History)       ALLERGIES Allergies  Allergen  Reactions  . Lipitor [Atorvastatin] Rash and Other (See Comments)    Passed out    PAST MEDICAL HISTORY Past Medical History:  Diagnosis Date  . Arthritis   . Colonic polyp   . COPD (chronic obstructive pulmonary disease) ([place])   . GERD (gastroesophageal reflux disease)   . Gout   . Malignant tumor of kidney ([place])   . Pain    LOWER BACK AND LEFT HIP - HX OF PREVIOUS LUMBAR AND CERVICAL SURGERY--PT STATES HE HAS HAD NUMBNESS IN BOTH FEET SINCE HIS BACK SURGERY  . Renal calculus   . Shortness of breath    Past Surgical History:  Procedure Laterality Date  . BACK SURGERY     LUMBAR SURGERY  . CATARACT EXTRACTION Bilateral 2003   Surgeon unknown  . EYE SURGERY     BILATERAL CATARACT EXTRACTIONS  . GIVENS CAPSULE STUDY N/A 10/07/2015   Procedure: GIVENS CAPSULE STUDY;  Surgeon: Carol Ada, MD;  Location: [facility];  Service: Endoscopy;  Laterality: N/A;  . HAND SURGERY     left  . KNEE SURGERY     right  . LAMINECTOMY     cervical  . NEPHRECTOMY     PT STATES RIGHT KIDNEY WAS REMOVED  . 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 TOTAL KNEE ARTHROPLASTY Left 10/19/2012   Procedure: LEFT TOTAL KNEE ARTHROPLASTY;  Surgeon: Magnus Sinning, MD;  Location[facility];  Service: Orthopedics;  Laterality: Left;    FAMILY HISTORY Family History  Problem Relation Age of Onset  .  Diabetes Mother   . COPD Brother   . COPD Sister   . Amblyopia Neg Hx   . Blindness Neg Hx   . Cataracts Neg Hx   . Glaucoma Neg Hx   . Macular degeneration Neg Hx   . Retinal detachment Neg Hx   . Retinitis pigmentosa Neg Hx     SOCIAL HISTORY Social History  Substance Use Topics  . Smoking status: Former Smoker    Packs/day: 2.00    Years: 40.00    Types: Cigarettes, Pipe    Quit date: 06/09/1995  . Smokeless tobacco: Never Used  . Alcohol use No         OPHTHALMIC EXAM:  Base Eye Exam    Visual Acuity (Snellen - Linear)      Right Left   Dist cc 20/25 CF at 1'   Dist ph cc 20/20 NI    Correction:  Glasses       Tonometry (Applanation, 8:11 AM)      Right Left   Pressure 14 17       Pupils      Dark Light Shape React APD   Right 4 3 Round Brisk None   Left 4 3 Round Slow None       Visual Fields (Counting fingers)      Left Right     Full   Restrictions Partial outer superior temporal, superior nasal deficiencies        Extraocular Movement      Right Left    Full, Ortho Full, Ortho       Neuro/Psych    Oriented x3:  Yes   Mood/Affect:  Normal       Dilation    Both eyes:  1.0% Mydriacyl, 2.5% Phenylephrine @ 8:11 AM        Slit Lamp and Fundus Exam    External Exam      Right Left   External Normal Normal       Slit Lamp Exam      Right Left   Lids/Lashes Dermatochalasis - upper lid Dermatochalasis - upper lid   Conjunctiva/Sclera White and quiet White and quiet   Cornea Clear Clear   Anterior Chamber Deep and quiet Deep and quiet   Iris Round and dilated Round and dilated - dilation 27m   Lens Three piece Posterior chamber intraocular lens Three piece Posterior chamber intraocular lens   Vitreous Vitreous syneresis Vitreous syneresis with pigment in vitreous , Posterior vitreous detachment       Fundus Exam      Right Left   Disc Temporal and Nasal Peripapillary pigment  Normal   C/D Ratio 0.45 0.45   Macula Flat; , Retinal pigment epithelial mottling - mild Inferior detachment with corrugations; sup nasal macula attached   Vessels Vascular attenuation - mild Vascular attenuation - mild   Periphery Flat; Scattered Reticular degeneration; mild lineal horizontal Hemorrhage mid-zone; no RT or RD on scleral depression;  Retinal detachment from 2 o'clock to 10 o'clock,  Horseshoe flap tear at 0530 o'clock; Inferior PVR; Inferior starfold along inferior arcade; scattered reticular degeneration        Refraction    Wearing Rx      Sphere Cylinder Axis Add   Right -0.75 +1.00 151 +2.50   Left -0.50 +1.00 020 +2.50   Age:  652m Type:   PAL          IMAGING AND PROCEDURES  Imaging and Procedures for 02/17/17  OCT, Retina - OU - Both Eyes     Right Eye Quality was good. Central Foveal Thickness: 246. Progression has no prior data. Findings include normal foveal contour, no IRF, no SRF.   Left Eye Quality was good. Progression has no prior data. Findings include subretinal fluid, intraretinal fluid, cystoid macular edema (Fovea involving inferior retinal detachment).   Notes Images taken, stored on drive   Diagnosis / Impression:  OD: NFP; no IRF/SRF OS: fovea off retinal detachment; detached retina with cystic changes  Clinical management:  See below  Abbreviations: NFP - Normal foveal profile. CME - cystoid macular edema. PED - pigment epithelial detachment. IRF - intraretinal fluid. SRF - subretinal fluid. EZ - ellipsoid zone. ERM - epiretinal membrane. ORA - outer retinal atrophy. ORT - outer retinal tubulation. SRHM - subretinal hyper-reflective material                ASSESSMENT/PLAN:    ICD-10-CM   1. 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   - Potential treatment options including delimiting laser, pneumatic retinopexy, scleral buckle, and vitrectomy, cryotherapy and laser, and the use of air, gas, and oil discussed with patient.   - The risks of blindness, loss of vision, infection, hemorrhage, cataract progression or lens displacement were discussed with patient. - recommend SBP +25g PPV OS today, under general anesthesia - pt wishes to  proceed with surgery for retinal detachment repair - informed consent obtained and signed - pt has been NPO since midnight last night - case posted for 230 today at [place] - f/u tomorrow at 915 am for POD1  3. Pseudophakia OU - IOLs in good position OU - monitor  Ophthalmic Meds Ordered this visit:  No orders of the defined types were placed in this encounter.      Return in about 1 day (around 02/18/2017) for POV.  There are no Patient Instructions on file for this visit.   Explained the diagnoses, plan, and follow up with the patient and they expressed understanding.  Patient expressed understanding of the importance of proper follow up care.   Gardiner Sleeper, M.D., Ph.D. Vitreoretinal Surgeon [facility] 02/17/17     Abbreviations: M myopia (nearsighted); A astigmatism; H hyperopia (farsighted); P presbyopia; Mrx spectacle prescription;  CTL contact lenses; OD right eye; OS left eye; OU both eyes  XT exotropia; ET esotropia; PEK punctate epithelial keratitis; PEE punctate epithelial erosions; DES dry eye syndrome; MGD meibomian gland dysfunction; ATs artificial tears; PFAT's preservative free artificial tears; [place] nuclear sclerotic cataract; PSC posterior subcapsular cataract; ERM epi-retinal membrane; PVD posterior vitreous detachment; RD retinal detachment; DM diabetes mellitus; DR diabetic retinopathy; NPDR non-proliferative diabetic retinopathy; PDR proliferative diabetic retinopathy; CSME clinically significant macular edema; DME diabetic macular edema; dbh dot blot hemorrhages; CWS cotton wool spot; POAG primary open angle glaucoma; C/D cup-to-disc ratio; HVF humphrey visual field; GVF goldmann visual field; OCT optical coherence tomography; IOP intraocular pressure; BRVO Branch retinal vein occlusion; CRVO central retinal vein occlusion; CRAO central  retinal artery occlusion; BRAO branch retinal artery occlusion; RT retinal tear; SB scleral  buckle; PPV pars plana vitrectomy; VH Vitreous hemorrhage; PRP panretinal laser photocoagulation; IVK intravitreal kenalog; VMT vitreomacular traction; MH Macular hole;  NVD neovascularization of the disc; NVE neovascularization elsewhere; AREDS age related eye disease study; ARMD age related macular degeneration; POAG primary open angle glaucoma; EBMD epithelial/anterior basement membrane dystrophy; ACIOL anterior chamber intraocular lens; IOL intraocular lens; PCIOL posterior chamber intraocular lens; Phaco/IOL phacoemulsification with intraocular lens placement; [place] photorefractive keratectomy; LASIK laser assisted in situ keratomileusis; HTN hypertension; DM diabetes mellitus; COPD chronic obstructive pulmonary disease

## 2017-02-17 ENCOUNTER — Encounter (HOSPITAL_COMMUNITY): Payer: Self-pay | Admitting: General Practice

## 2017-02-17 ENCOUNTER — Ambulatory Visit (INDEPENDENT_AMBULATORY_CARE_PROVIDER_SITE_OTHER): Payer: Medicare Other | Admitting: Ophthalmology

## 2017-02-17 ENCOUNTER — Ambulatory Visit (HOSPITAL_COMMUNITY)
Admission: RE | Admit: 2017-02-17 | Discharge: 2017-02-17 | Disposition: A | Discharge status: Home / Self Care | Payer: Medicare Other | Source: Ambulatory Visit | Attending: Ophthalmology | Admitting: Ophthalmology

## 2017-02-17 ENCOUNTER — Encounter (INDEPENDENT_AMBULATORY_CARE_PROVIDER_SITE_OTHER): Payer: Medicare Other | Admitting: Ophthalmology

## 2017-02-17 ENCOUNTER — Encounter (HOSPITAL_COMMUNITY)
Admission: RE | Disposition: A | Discharge status: Home / Self Care | Payer: Self-pay | Source: Ambulatory Visit | Attending: Ophthalmology

## 2017-02-17 ENCOUNTER — Ambulatory Visit (HOSPITAL_COMMUNITY): Payer: Medicare Other | Admitting: Anesthesiology

## 2017-02-17 ENCOUNTER — Encounter (INDEPENDENT_AMBULATORY_CARE_PROVIDER_SITE_OTHER): Payer: Self-pay | Admitting: Ophthalmology

## 2017-02-17 DIAGNOSIS — Z888 Allergy status to other drugs, medicaments and biological substances status: Secondary | ICD-10-CM | POA: Insufficient documentation

## 2017-02-17 DIAGNOSIS — Z87891 Personal history of nicotine dependence: Secondary | ICD-10-CM | POA: Diagnosis not present

## 2017-02-17 DIAGNOSIS — Z87442 Personal history of urinary calculi: Secondary | ICD-10-CM | POA: Diagnosis not present

## 2017-02-17 DIAGNOSIS — Z96652 Presence of left artificial knee joint: Secondary | ICD-10-CM | POA: Insufficient documentation

## 2017-02-17 DIAGNOSIS — H3322 Serous retinal detachment, left eye: Secondary | ICD-10-CM

## 2017-02-17 DIAGNOSIS — Z86010 Personal history of colonic polyps: Secondary | ICD-10-CM | POA: Insufficient documentation

## 2017-02-17 DIAGNOSIS — H338 Other retinal detachments: Secondary | ICD-10-CM | POA: Diagnosis not present

## 2017-02-17 DIAGNOSIS — Z833 Family history of diabetes mellitus: Secondary | ICD-10-CM | POA: Insufficient documentation

## 2017-02-17 DIAGNOSIS — H33022 Retinal detachment with multiple breaks, left eye: Secondary | ICD-10-CM | POA: Insufficient documentation

## 2017-02-17 DIAGNOSIS — Z9842 Cataract extraction status, left eye: Secondary | ICD-10-CM | POA: Diagnosis not present

## 2017-02-17 DIAGNOSIS — R0602 Shortness of breath: Secondary | ICD-10-CM | POA: Insufficient documentation

## 2017-02-17 DIAGNOSIS — Z85528 Personal history of other malignant neoplasm of kidney: Secondary | ICD-10-CM | POA: Diagnosis not present

## 2017-02-17 DIAGNOSIS — H3522 Other non-diabetic proliferative retinopathy, left eye: Secondary | ICD-10-CM | POA: Diagnosis not present

## 2017-02-17 DIAGNOSIS — Z9841 Cataract extraction status, right eye: Secondary | ICD-10-CM | POA: Insufficient documentation

## 2017-02-17 DIAGNOSIS — J449 Chronic obstructive pulmonary disease, unspecified: Secondary | ICD-10-CM | POA: Diagnosis not present

## 2017-02-17 DIAGNOSIS — Z825 Family history of asthma and other chronic lower respiratory diseases: Secondary | ICD-10-CM | POA: Insufficient documentation

## 2017-02-17 DIAGNOSIS — E113592 Type 2 diabetes mellitus with proliferative diabetic retinopathy without macular edema, left eye: Secondary | ICD-10-CM | POA: Diagnosis not present

## 2017-02-17 DIAGNOSIS — K219 Gastro-esophageal reflux disease without esophagitis: Secondary | ICD-10-CM | POA: Insufficient documentation

## 2017-02-17 DIAGNOSIS — H3342 Traction detachment of retina, left eye: Secondary | ICD-10-CM | POA: Diagnosis not present

## 2017-02-17 DIAGNOSIS — Z961 Presence of intraocular lens: Secondary | ICD-10-CM | POA: Diagnosis not present

## 2017-02-17 DIAGNOSIS — M1990 Unspecified osteoarthritis, unspecified site: Secondary | ICD-10-CM | POA: Diagnosis not present

## 2017-02-17 DIAGNOSIS — Z905 Acquired absence of kidney: Secondary | ICD-10-CM | POA: Diagnosis not present

## 2017-02-17 DIAGNOSIS — M109 Gout, unspecified: Secondary | ICD-10-CM | POA: Insufficient documentation

## 2017-02-17 HISTORY — PX: VITRECTOMY 25 GAUGE WITH SCLERAL BUCKLE: SHX6183

## 2017-02-17 LAB — BASIC METABOLIC PANEL
ANION GAP: 6 (ref 5–15)
BUN: 13 mg/dL (ref 6–20)
CALCIUM: 9.3 mg/dL (ref 8.9–10.3)
CO2: 22 mmol/L (ref 22–32)
CREATININE: 0.92 mg/dL (ref 0.61–1.24)
Chloride: 110 mmol/L (ref 101–111)
GFR calc Af Amer: >60 mL/min (ref >60)
GLUCOSE: 95 mg/dL (ref 65–99)
Potassium: 3.9 mmol/L (ref 3.5–5.1)
Sodium: 138 mmol/L (ref 135–145)

## 2017-02-17 LAB — CBC
HCT: 42.2 % (ref 39.0–52.0)
HEMOGLOBIN: 14.1 g/dL (ref 13.0–17.0)
MCH: 31.6 pg (ref 26.0–34.0)
MCHC: 33.4 g/dL (ref 30.0–36.0)
MCV: 94.6 fL (ref 78.0–100.0)
PLATELETS: 150 10*3/uL (ref 150–400)
RBC: 4.46 MIL/uL (ref 4.22–5.81)
RDW: 13.2 % (ref 11.5–15.5)
WBC: 7.7 10*3/uL (ref 4.0–10.5)

## 2017-02-17 SURGERY — VITRECTOMY, USING 25-GAUGE INSTRUMENTS, WITH SCLERAL BUCKLING
Anesthesia: General | Site: Eye | Laterality: Left

## 2017-02-17 MED ORDER — PROMETHAZINE HCL 25 MG/ML IJ SOLN: 6.2500 mg | INTRAMUSCULAR | Status: DC | PRN

## 2017-02-17 MED ORDER — BSS PLUS IO SOLN
INTRAOCULAR | Status: AC
  Filled 2017-02-17: qty 500

## 2017-02-17 MED ORDER — BUPIVACAINE HCL (PF) 0.75 % IJ SOLN
INTRAMUSCULAR | Status: AC
  Filled 2017-02-17: qty 10

## 2017-02-17 MED ORDER — DORZOLAMIDE HCL-TIMOLOL MAL 2-0.5 % OP SOLN
OPHTHALMIC | Status: AC
  Filled 2017-02-17: qty 10

## 2017-02-17 MED ORDER — EPINEPHRINE PF 1 MG/ML IJ SOLN
INTRAMUSCULAR | Status: DC | PRN
  Administered 2017-02-17: .3 mL

## 2017-02-17 MED ORDER — TOBRAMYCIN-DEXAMETHASONE 0.3-0.1 % OP OINT
TOPICAL_OINTMENT | OPHTHALMIC | Status: AC
  Filled 2017-02-17: qty 3.5

## 2017-02-17 MED ORDER — BRIMONIDINE TARTRATE-TIMOLOL 0.2-0.5 % OP SOLN
OPHTHALMIC | Status: DC | PRN
  Administered 2017-02-17: 1 [drp] via OPHTHALMIC

## 2017-02-17 MED ORDER — GATIFLOXACIN 0.5 % OP SOLN OPTIME - NO CHARGE
OPHTHALMIC | Status: DC | PRN
  Administered 2017-02-17: 1 [drp] via OPHTHALMIC

## 2017-02-17 MED ORDER — BSS PLUS IO SOLN
INTRAOCULAR | Status: DC | PRN
  Administered 2017-02-17 (×2): 1 via INTRAOCULAR

## 2017-02-17 MED ORDER — FENTANYL CITRATE (PF) 100 MCG/2ML IJ SOLN
INTRAMUSCULAR | Status: DC | PRN
  Administered 2017-02-17: 50 ug via INTRAVENOUS
  Administered 2017-02-17: 100 ug via INTRAVENOUS
  Administered 2017-02-17 (×2): 50 ug via INTRAVENOUS

## 2017-02-17 MED ORDER — EPINEPHRINE PF 1 MG/ML IJ SOLN
INTRAMUSCULAR | Status: AC
  Filled 2017-02-17: qty 1

## 2017-02-17 MED ORDER — ATROPINE SULFATE 1 % OP SOLN
1.0000 [drp] | OPHTHALMIC | Status: AC | PRN
Start: 2017-02-17 — End: 2017-02-17
  Administered 2017-02-17 (×3): 1 [drp] via OPHTHALMIC
  Filled 2017-02-17: qty 5

## 2017-02-17 MED ORDER — PREDNISOLONE ACETATE 1 % OP SUSP
OPHTHALMIC | Status: DC | PRN
  Administered 2017-02-17: 1 [drp] via OPHTHALMIC

## 2017-02-17 MED ORDER — NA CHONDROIT SULF-NA HYALURON 40-30 MG/ML IO SOLN
INTRAOCULAR | Status: DC | PRN
  Administered 2017-02-17 (×2): 0.5 mL via INTRAOCULAR

## 2017-02-17 MED ORDER — DEXAMETHASONE SODIUM PHOSPHATE 10 MG/ML IJ SOLN
INTRAMUSCULAR | Status: DC | PRN
  Administered 2017-02-17: 5 mg via INTRAVENOUS

## 2017-02-17 MED ORDER — ACETAZOLAMIDE SODIUM 500 MG IJ SOLR
INTRAMUSCULAR | Status: AC
  Filled 2017-02-17: qty 500

## 2017-02-17 MED ORDER — POLYMYXIN B SULFATE 500000 UNITS IJ SOLR
INTRAMUSCULAR | Status: AC
  Filled 2017-02-17: qty 500000

## 2017-02-17 MED ORDER — EPHEDRINE 5 MG/ML INJ
INTRAVENOUS | Status: AC
  Filled 2017-02-17: qty 10

## 2017-02-17 MED ORDER — BRIMONIDINE TARTRATE 0.15 % OP SOLN
1.0000 [drp] | Freq: Once | OPHTHALMIC | Status: DC
  Filled 2017-02-17 (×3): qty 5

## 2017-02-17 MED ORDER — GATIFLOXACIN 0.5 % OP SOLN
1.0000 [drp] | OPHTHALMIC | Status: AC | PRN
  Administered 2017-02-17 (×3): 1 [drp] via OPHTHALMIC
  Filled 2017-02-17: qty 2.5

## 2017-02-17 MED ORDER — CARBACHOL 0.01 % IO SOLN
0.5000 mL | INTRAOCULAR | Status: DC
  Filled 2017-02-17 (×3): qty 1.5

## 2017-02-17 MED ORDER — NA CHONDROIT SULF-NA HYALURON 40-30 MG/ML IO SOLN
0.5000 mL | INTRAOCULAR | Status: DC
  Filled 2017-02-17 (×5): qty 0.5

## 2017-02-17 MED ORDER — LIDOCAINE 2% (20 MG/ML) 5 ML SYRINGE
INTRAMUSCULAR | Status: DC | PRN
  Administered 2017-02-17: 60 mg via INTRAVENOUS

## 2017-02-17 MED ORDER — SODIUM CHLORIDE 0.9 % IV SOLN
INTRAVENOUS | Status: DC
  Administered 2017-02-17: 12:00:00 via INTRAVENOUS

## 2017-02-17 MED ORDER — NA CHONDROIT SULF-NA HYALURON 40-30 MG/ML IO SOLN
INTRAOCULAR | Status: AC
  Filled 2017-02-17: qty 0.5

## 2017-02-17 MED ORDER — SODIUM CHLORIDE 0.9 % IV SOLN
INTRAVENOUS | Status: DC | PRN
  Administered 2017-02-17 (×3): via INTRAVENOUS

## 2017-02-17 MED ORDER — MEPERIDINE HCL 25 MG/ML IJ SOLN: 6.2500 mg | INTRAMUSCULAR | Status: DC | PRN

## 2017-02-17 MED ORDER — PHENYLEPHRINE 40 MCG/ML (10ML) SYRINGE FOR IV PUSH (FOR BLOOD PRESSURE SUPPORT)
PREFILLED_SYRINGE | INTRAVENOUS | Status: DC | PRN
  Administered 2017-02-17: 80 ug via INTRAVENOUS
  Administered 2017-02-17 (×2): 40 ug via INTRAVENOUS

## 2017-02-17 MED ORDER — PROPOFOL 10 MG/ML IV BOLUS
INTRAVENOUS | Status: AC
  Filled 2017-02-17: qty 20

## 2017-02-17 MED ORDER — PROPARACAINE HCL 0.5 % OP SOLN
1.0000 [drp] | OPHTHALMIC | Status: AC | PRN
  Administered 2017-02-17 (×3): 1 [drp] via OPHTHALMIC
  Filled 2017-02-17: qty 15

## 2017-02-17 MED ORDER — SUGAMMADEX SODIUM 200 MG/2ML IV SOLN
INTRAVENOUS | Status: DC | PRN
Start: 2017-02-17 — End: 2017-02-17
  Administered 2017-02-17: 200 mg via INTRAVENOUS

## 2017-02-17 MED ORDER — EPHEDRINE SULFATE-NACL 50-0.9 MG/10ML-% IV SOSY
PREFILLED_SYRINGE | INTRAVENOUS | Status: DC | PRN
  Administered 2017-02-17 (×6): 5 mg via INTRAVENOUS
  Administered 2017-02-17: 10 mg via INTRAVENOUS

## 2017-02-17 MED ORDER — BACITRACIN-POLYMYXIN B 500-10000 UNIT/GM OP OINT
TOPICAL_OINTMENT | OPHTHALMIC | Status: AC
  Filled 2017-02-17: qty 3.5

## 2017-02-17 MED ORDER — HYDROCODONE-ACETAMINOPHEN 5-325 MG PO TABS: 1.0000 | ORAL_TABLET | ORAL | 0 refills | Status: DC | PRN

## 2017-02-17 MED ORDER — ATROPINE SULFATE 1 % OP SOLN
OPHTHALMIC | Status: DC | PRN
  Administered 2017-02-17: 1 [drp] via OPHTHALMIC

## 2017-02-17 MED ORDER — NA CHONDROIT SULF-NA HYALURON 40-30 MG/ML IO SOLN
0.5000 mL | INTRAOCULAR | Status: DC
  Filled 2017-02-17: qty 0.5

## 2017-02-17 MED ORDER — BRIMONIDINE TARTRATE 0.15 % OP SOLN
OPHTHALMIC | Status: DC | PRN
  Administered 2017-02-17: 1 [drp] via OPHTHALMIC

## 2017-02-17 MED ORDER — FENTANYL CITRATE (PF) 250 MCG/5ML IJ SOLN
INTRAMUSCULAR | Status: AC
  Filled 2017-02-17: qty 5

## 2017-02-17 MED ORDER — PREDNISOLONE ACETATE 1 % OP SUSP
1.0000 [drp] | OPHTHALMIC | Status: AC
  Administered 2017-02-17: 1 [drp] via OPHTHALMIC
  Filled 2017-02-17: qty 5

## 2017-02-17 MED ORDER — BUPIVACAINE HCL (PF) 0.75 % IJ SOLN
INTRAMUSCULAR | Status: AC
  Filled 2017-02-17: qty 30

## 2017-02-17 MED ORDER — ATROPINE SULFATE 1 % OP OINT
TOPICAL_OINTMENT | OPHTHALMIC | Status: AC
  Filled 2017-02-17: qty 3.5

## 2017-02-17 MED ORDER — BUPIVACAINE HCL (PF) 0.75 % IJ SOLN
INTRAMUSCULAR | Status: DC | PRN
  Administered 2017-02-17: 5 mL

## 2017-02-17 MED ORDER — ONDANSETRON HCL 4 MG/2ML IJ SOLN
INTRAMUSCULAR | Status: DC | PRN
  Administered 2017-02-17: 4 mg via INTRAVENOUS

## 2017-02-17 MED ORDER — BACITRACIN-POLYMYXIN B 500-10000 UNIT/GM OP OINT
TOPICAL_OINTMENT | OPHTHALMIC | Status: DC | PRN
  Administered 2017-02-17: 1 via OPHTHALMIC

## 2017-02-17 MED ORDER — CEFTAZIDIME 1 G IJ SOLR
INTRAMUSCULAR | Status: AC
  Filled 2017-02-17: qty 1

## 2017-02-17 MED ORDER — 0.9 % SODIUM CHLORIDE (POUR BTL) OPTIME
TOPICAL | Status: DC | PRN
  Administered 2017-02-17: 1000 mL

## 2017-02-17 MED ORDER — ONDANSETRON HCL 4 MG/2ML IJ SOLN
INTRAMUSCULAR | Status: AC
  Filled 2017-02-17: qty 2

## 2017-02-17 MED ORDER — FENTANYL CITRATE (PF) 100 MCG/2ML IJ SOLN: 25.0000 ug | INTRAMUSCULAR | Status: DC | PRN

## 2017-02-17 MED ORDER — STERILE WATER FOR INJECTION IJ SOLN
INTRAMUSCULAR | Status: DC | PRN
  Administered 2017-02-17: 20 mL

## 2017-02-17 MED ORDER — HEMOSTATIC AGENTS (NO CHARGE) OPTIME
TOPICAL | Status: DC | PRN
  Administered 2017-02-17: 1 via TOPICAL

## 2017-02-17 MED ORDER — LACTATED RINGERS IV SOLN: INTRAVENOUS | Status: DC | PRN

## 2017-02-17 MED ORDER — PHENYLEPHRINE HCL 10 % OP SOLN
1.0000 [drp] | OPHTHALMIC | Status: AC | PRN
  Administered 2017-02-17 (×3): 1 [drp] via OPHTHALMIC
  Filled 2017-02-17: qty 5

## 2017-02-17 MED ORDER — STERILE WATER FOR INJECTION IJ SOLN
INTRAMUSCULAR | Status: AC
  Filled 2017-02-17: qty 10

## 2017-02-17 MED ORDER — TRIAMCINOLONE ACETONIDE 40 MG/ML IJ SUSP
INTRAMUSCULAR | Status: DC | PRN
  Administered 2017-02-17: 40 mg

## 2017-02-17 MED ORDER — ROCURONIUM BROMIDE 10 MG/ML (PF) SYRINGE
PREFILLED_SYRINGE | INTRAVENOUS | Status: DC | PRN
  Administered 2017-02-17 (×2): 20 mg via INTRAVENOUS
  Administered 2017-02-17 (×2): 10 mg via INTRAVENOUS
  Administered 2017-02-17: 40 mg via INTRAVENOUS
  Administered 2017-02-17: 20 mg via INTRAVENOUS
  Administered 2017-02-17 (×2): 10 mg via INTRAVENOUS

## 2017-02-17 MED ORDER — TROPICAMIDE 1 % OP SOLN
1.0000 [drp] | OPHTHALMIC | Status: AC | PRN
  Administered 2017-02-17 (×3): 1 [drp] via OPHTHALMIC
  Filled 2017-02-17: qty 15

## 2017-02-17 MED ORDER — LIDOCAINE HCL 2 % IJ SOLN
INTRAMUSCULAR | Status: AC
  Filled 2017-02-17: qty 20

## 2017-02-17 MED ORDER — TRIAMCINOLONE ACETONIDE 40 MG/ML IJ SUSP
INTRAMUSCULAR | Status: AC
  Filled 2017-02-17: qty 5

## 2017-02-17 MED ORDER — PROPOFOL 10 MG/ML IV BOLUS
INTRAVENOUS | Status: DC | PRN
  Administered 2017-02-17: 30 mg via INTRAVENOUS
  Administered 2017-02-17: 90 mg via INTRAVENOUS
  Administered 2017-02-17: 40 mg via INTRAVENOUS

## 2017-02-17 MED ORDER — DEXAMETHASONE SODIUM PHOSPHATE 10 MG/ML IJ SOLN
INTRAMUSCULAR | Status: AC
  Filled 2017-02-17: qty 1

## 2017-02-17 MED ORDER — LACTATED RINGERS IV SOLN: INTRAVENOUS | Status: DC

## 2017-02-17 MED ORDER — INDOCYANINE GREEN 25 MG IV SOLR
25.0000 mg | Freq: Once | INTRAVENOUS | Status: DC
  Filled 2017-02-17 (×2): qty 25

## 2017-02-17 MED ORDER — PREDNISOLONE ACETATE 1 % OP SUSP
1.0000 [drp] | Freq: Once | OPHTHALMIC | Status: DC
  Filled 2017-02-17 (×3): qty 1

## 2017-02-17 SURGICAL SUPPLY — 78 items
APPLICATOR COTTON TIP 6IN STRL (MISCELLANEOUS) ×10 IMPLANT
APPLICATOR DR MATTHEWS STRL (MISCELLANEOUS) ×6 IMPLANT
BAND SCLERAL BUCKLING TYPE 42 (Ophthalmic Related) ×2 IMPLANT
BANDAGE EYE OVAL (MISCELLANEOUS) ×4 IMPLANT
BLADE EYE CATARACT 19 1.4 BEAV (BLADE) IMPLANT
BLADE MVR KNIFE 19G (BLADE) ×2 IMPLANT
CANNULA ANTERIOR CHAMBER 27GA (MISCELLANEOUS) ×2 IMPLANT
CANNULA DUAL BORE 23G (CANNULA) IMPLANT
CANNULA DUALBORE 25G (CANNULA) ×2 IMPLANT
CANNULA VLV SOFT TIP 25GA (OPHTHALMIC) ×2 IMPLANT
CORD BIPOLAR FORCEPS 12FT (ELECTRODE) IMPLANT
COTTONBALL LRG STERILE PKG (GAUZE/BANDAGES/DRESSINGS) IMPLANT
COVER SURGICAL LIGHT HANDLE (MISCELLANEOUS) ×2 IMPLANT
DRAPE MICROSCOPE LEICA (MISCELLANEOUS) IMPLANT
DRAPE MICROSCOPE LEICA 46X105 (MISCELLANEOUS) ×2 IMPLANT
DRAPE OPHTHALMIC 77X100 STRL (CUSTOM PROCEDURE TRAY) ×2 IMPLANT
ERASER HMR WETFIELD 23G BP (MISCELLANEOUS) ×2 IMPLANT
FILTER BLUE MILLIPORE (MISCELLANEOUS) IMPLANT
FILTER STRAW FLUID ASPIR (MISCELLANEOUS) IMPLANT
FORCEPS GRIESHABER ILM 25G A (INSTRUMENTS) ×2 IMPLANT
FORCEPS GRIESHABER MAX 25G (MISCELLANEOUS) ×2 IMPLANT
GAS AUTO FILL CONSTEL (OPHTHALMIC)
GAS AUTO FILL CONSTELLATION (OPHTHALMIC) IMPLANT
GLOVE BIOGEL M STRL SZ7.5 (GLOVE) ×4 IMPLANT
GLOVE ECLIPSE 7.5 STRL STRAW (GLOVE) IMPLANT
GOWN STRL REUS W/ TWL LRG LVL3 (GOWN DISPOSABLE) ×3 IMPLANT
GOWN STRL REUS W/TWL LRG LVL3 (GOWN DISPOSABLE) ×3
HANDLE PNEUMATIC FOR CONSTEL (OPHTHALMIC) IMPLANT
KIT BASIN OR (CUSTOM PROCEDURE TRAY) ×2 IMPLANT
KIT PERFLUORON PROCEDURE 5ML (MISCELLANEOUS) ×2 IMPLANT
KNIFE CRESCENT 1.75 EDGEAHEAD (BLADE) IMPLANT
KNIFE GRIESHABER SHARP 2.5MM (MISCELLANEOUS) IMPLANT
LENS BIOM SUPER VIEW SET DISP (OPHTHALMIC RELATED) ×2 IMPLANT
LOOP FINESSE 25 GA (MISCELLANEOUS) ×2 IMPLANT
MICROPICK 25G (MISCELLANEOUS)
NEEDLE 18GX1X1/2 (RX/OR ONLY) (NEEDLE) ×2 IMPLANT
NEEDLE 25GX 5/8IN NON SAFETY (NEEDLE) ×2 IMPLANT
NEEDLE FILTER BLUNT 18X 1/2SAF (NEEDLE) ×1
NEEDLE FILTER BLUNT 18X1 1/2 (NEEDLE) ×1 IMPLANT
NEEDLE HYPO 30X.5 LL (NEEDLE) ×4 IMPLANT
NEEDLE PRECISIONGLIDE 27X1.5 (NEEDLE) IMPLANT
NS IRRIG 1000ML POUR BTL (IV SOLUTION) ×2 IMPLANT
OIL SILICONE OPHTHALMIC 1000 (Ophthalmic Related) ×2 IMPLANT
PACK VITRECTOMY CUSTOM (CUSTOM PROCEDURE TRAY) ×2 IMPLANT
PAD ARMBOARD 7.5X6 YLW CONV (MISCELLANEOUS) ×2 IMPLANT
PAK PIK VITRECTOMY CVS 25GA (OPHTHALMIC) ×2 IMPLANT
PIC ILLUMINATED 25G (OPHTHALMIC)
PICK MICROPICK 25G (MISCELLANEOUS) IMPLANT
PIK ILLUMINATED 25G (OPHTHALMIC) IMPLANT
PROBE DIATHERMY DSP 27GA (MISCELLANEOUS) IMPLANT
PROBE ENDO DIATHERMY 25G (MISCELLANEOUS) ×2 IMPLANT
PROBE LASER ILLUM FLEX CVD 25G (OPHTHALMIC) ×2 IMPLANT
REPL STRA BRUSH NEEDLE (NEEDLE) IMPLANT
RESERVOIR BACK FLUSH (MISCELLANEOUS) IMPLANT
ROLLS DENTAL (MISCELLANEOUS) IMPLANT
SCRAPER DIAMOND 25GA (OPHTHALMIC RELATED) IMPLANT
SET INJECTOR OIL FLUID CONSTEL (OPHTHALMIC) ×2 IMPLANT
SPONGE SURGIFOAM ABS GEL 12-7 (HEMOSTASIS) ×2 IMPLANT
STOPCOCK 4 WAY LG BORE MALE ST (IV SETS) IMPLANT
SUT CHROMIC 7 0 TG140 8 (SUTURE) IMPLANT
SUT ETHILON 5.0 S-24 (SUTURE) ×4 IMPLANT
SUT ETHILON 9 0 TG140 8 (SUTURE) IMPLANT
SUT MERSILENE 4 0 RV 2 (SUTURE) IMPLANT
SUT SILK 2 0 (SUTURE) ×1
SUT SILK 2 0 TIES 17X18 (SUTURE) ×1
SUT SILK 2-0 18XBRD TIE 12 (SUTURE) ×1 IMPLANT
SUT SILK 2-0 18XBRD TIE BLK (SUTURE) ×1 IMPLANT
SUT SILK 4 0 RB 1 (SUTURE) IMPLANT
SUT VICRYL 7 0 TG140 8 (SUTURE) ×4 IMPLANT
SYR 10ML LL (SYRINGE) IMPLANT
SYR 20CC LL (SYRINGE) IMPLANT
SYR 5ML LL (SYRINGE) IMPLANT
SYR BULB 3OZ (MISCELLANEOUS) IMPLANT
SYR TB 1ML LUER SLIP (SYRINGE) ×2 IMPLANT
TOWEL OR 17X26 4PK STRL BLUE (TOWEL DISPOSABLE) IMPLANT
TUBING HIGH PRESS EXTEN 6IN (TUBING) IMPLANT
WATER STERILE IRR 1000ML POUR (IV SOLUTION) ×2 IMPLANT
WIPE INSTRUMENT VISIWIPE 73X73 (MISCELLANEOUS) IMPLANT

## 2017-02-17 NOTE — Transfer of Care (Signed)
Immediate Anesthesia Transfer of Care Note  Patient: Jerome Mays  Procedure(s) Performed: Procedure(s): VITRECTOMY 25 GAUGE WITH SCLERAL BUCKLE membrane peel, injection of silicone oil, and endolaser photocoagulation. (Left)  Patient Location: PACU  Anesthesia Type:General  Level of Consciousness: awake and alert   Airway & Oxygen Therapy: Patient Spontanous Breathing and Patient connected to nasal cannula oxygen  Post-op Assessment: Report given to RN, Post -op Vital signs reviewed and stable and Patient moving all extremities  Post vital signs: Reviewed and stable  Last Vitals:  Vitals:   02/17/17 1057 02/17/17 2027  BP: (!) 170/72   Pulse: 65   Resp: 20   Temp: 36.5 C 36.8 C  SpO2: 97%     Last Pain:  Vitals:   02/17/17 1057  TempSrc: Oral      Patients Stated Pain Goal: 2 (72/18/28 8337)  Complications: No apparent anesthesia complications

## 2017-02-17 NOTE — Anesthesia Preprocedure Evaluation (Addendum)
Anesthesia Evaluation  Patient identified by MRN, date of birth, ID band Patient awake    Reviewed: Allergy & Precautions, NPO status , Patient's Chart, lab work & pertinent test results  Airway Mallampati: I  TM Distance: >3 FB Neck ROM: Full    Dental  (+) Teeth Intact, Dental Advisory Given   Pulmonary neg pulmonary ROS, COPD, former smoker,    breath sounds clear to auscultation       Cardiovascular negative cardio ROS   Rhythm:Regular Rate:Normal     Neuro/Psych negative neurological ROS     GI/Hepatic Neg liver ROS, GERD  ,  Endo/Other  negative endocrine ROS  Renal/GU Renal disease     Musculoskeletal  (+) Arthritis ,   Abdominal   Peds  Hematology negative hematology ROS (+)   Anesthesia Other Findings Day of surgery medications reviewed with the patient.  Reproductive/Obstetrics                            Anesthesia Physical Anesthesia Plan  ASA: III  Anesthesia Plan: General   Post-op Pain Management:    Induction: Intravenous  PONV Risk Score and Plan: 3 and Ondansetron, Dexamethasone, Treatment may vary due to age or medical condition and Midazolam  Airway Management Planned: Oral ETT  Additional Equipment:   Intra-op Plan:   Post-operative Plan: Extubation in OR  Informed Consent: I have reviewed the patients History and Physical, chart, labs and discussed the procedure including the risks, benefits and alternatives for the proposed anesthesia with the patient or authorized representative who has indicated his/her understanding and acceptance.   Dental advisory given  Plan Discussed with: CRNA  Anesthesia Plan Comments:         Anesthesia Quick Evaluation

## 2017-02-17 NOTE — H&P (Signed)
BRENNON OTTERNESS is an 76 y.o. male.    Chief Complaint: vision loss, left eye  HPI: 76 yo WM with 5 wk history of progressive vision loss OS. Examined and found to have a mac off inferior retinal detachment.  Past Medical History:  Diagnosis Date  . Arthritis   . Colonic polyp   . COPD (chronic obstructive pulmonary disease) ([place])   . GERD (gastroesophageal reflux disease)   . Gout   . Malignant tumor of kidney ([place])   . Pain    LOWER BACK AND LEFT HIP - HX OF PREVIOUS LUMBAR AND CERVICAL SURGERY--PT STATES HE HAS HAD NUMBNESS IN BOTH FEET SINCE HIS BACK SURGERY  . Renal calculus   . Shortness of breath     Past Surgical History:  Procedure Laterality Date  . BACK SURGERY     LUMBAR SURGERY  . CATARACT EXTRACTION Bilateral 2003   Surgeon unknown  . EYE SURGERY     BILATERAL CATARACT EXTRACTIONS  . GIVENS CAPSULE STUDY N/A 10/07/2015   Procedure: GIVENS CAPSULE STUDY;  Surgeon: Carol Ada, MD;  Location: [facility];  Service: Endoscopy;  Laterality: N/A;  . HAND SURGERY     left  . KNEE SURGERY     right  . LAMINECTOMY     cervical  . NEPHRECTOMY     PT STATES RIGHT KIDNEY WAS REMOVED  . 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 TOTAL KNEE ARTHROPLASTY Left 10/19/2012   Procedure: LEFT TOTAL KNEE ARTHROPLASTY;  Surgeon: Magnus Sinning, MD;  Location: [facility];  Service: Orthopedics;  Laterality: Left;    Family History  Problem Relation Age of Onset  . Diabetes Mother   . COPD Brother   . COPD Sister   . Amblyopia Neg Hx   . Blindness Neg Hx   . Cataracts Neg Hx   . Glaucoma Neg Hx   . Macular degeneration Neg Hx   . Retinal detachment Neg Hx   . Retinitis pigmentosa Neg Hx    Social History:  reports that he quit smoking about 21 years ago. His smoking use included Cigarettes and Pipe. He has a 80.00 pack-year smoking history. He has never used smokeless tobacco. He reports that he does not drink alcohol or use drugs.  Allergies:  Allergies  Allergen Reactions   . Lipitor [Atorvastatin] Rash and Other (See Comments)    Passed out    No prescriptions prior to admission.    Review of systems otherwise negative  There were no vitals taken for this visit.  Physical exam: Mental status: oriented x3. Eyes: See eye exam associated with this date of surgery Ears, Nose, Throat: within normal limits Neck: Within Normal limits General: within normal limits Chest: Within normal limits Breast: deferred Heart: Within normal limits Abdomen: Within normal limits GU: deferred Extremities: within normal limits Skin: within normal limits  Assessment/Plan 1. Macula-involving, inferior rhegmatogenous retinal detachment, left eye  Plan: To [facility] for scleral buckle procedure + 25g pars plana vitrectomy OS under general anesthesia today at 230 pm Pt has been NPO since midnight last night F/u tomorrow for POD1  Gardiner Sleeper, M.D., Ph.D. Vitreoretinal Surgeon [facility] _0 @

## 2017-02-17 NOTE — Discharge Instructions (Addendum)
POSTOPERATIVE INSTRUCTIONS  Your doctor has performed vitreoretinal surgery on you at [facility] patched and shielded until seen by Dr. Coralyn Pear 915 AM tomorrow in clinic - Do not use drops until return - [place] - Sleep with belly down or on right side, avoid laying flat on back.    - No strenuous bending, stooping or lifting.  - You may not drive until further notice.   - Tylenol or any other over-the-counter pain reliever can be used according to your doctor. If more pain medicine is required, your doctor will have a prescription for you.  - You may read, go up and down stairs, and watch television.   Gardiner Sleeper, M.D., Ph.D. Vitreoretinal Surgeon [facility]    General Anesthesia, Adult, Care After These instructions provide you with information about caring for yourself after your procedure. Your health care provider may also give you more specific instructions. Your treatment has been planned according to current medical practices, but problems sometimes occur. Call your health care provider if you have any problems or questions after your procedure. What can I expect after the procedure? After the procedure, it is common to have:  Vomiting.  A sore throat.  Mental slowness.  It is common to feel:  Nauseous.  Cold or shivery.  Sleepy.  Tired.  Sore or achy, even in parts of your body where you did not have surgery.  Follow these instructions at home: For at least 24 hours after the procedure:  Do not: ? Participate in activities where you could fall or become injured. ? Drive. ? Use heavy machinery. ? Drink alcohol. ? Take sleeping pills or medicines that cause drowsiness. ? Make important decisions or sign legal documents. ? Take care of children on your own.  Rest. Eating and drinking  If you vomit, drink water, juice, or soup when you can drink without  vomiting.  Drink enough fluid to keep your urine clear or pale yellow.  Make sure you have little or no nausea before eating solid foods.  Follow the diet recommended by your health care provider. General instructions  Have a responsible adult stay with you until you are awake and alert.  Return to your normal activities as told by your health care provider. Ask your health care provider what activities are safe for you.  Take over-the-counter and prescription medicines only as told by your health care provider.  If you smoke, do not smoke without supervision.  Keep all follow-up visits as told by your health care provider. This is important. Contact a health care provider if:  You continue to have nausea or vomiting at home, and medicines are not helpful.  You cannot drink fluids or start eating again.  You cannot urinate after 8-12 hours.  You develop a skin rash.  You have fever.  You have increasing redness at the site of your procedure. Get help right away if:  You have difficulty breathing.  You have chest pain.  You have unexpected bleeding.  You feel that you are having a life-threatening or urgent problem. This information is not intended to replace advice given to you by your health care provider. Make sure you discuss any questions you have with your health care provider. Document Released: 08/31/2000 Document Revised: 10/28/2015 Document Reviewed: 05/09/2015 Elsevier Interactive Patient Education  [employer].

## 2017-02-17 NOTE — Brief Op Note (Signed)
02/17/2017  8:39 PM  PATIENT:  [name]  76 y.o. male  PRE-OPERATIVE DIAGNOSIS:  RETINAL DETACHMENT LEFT EYE  POST-OPERATIVE DIAGNOSIS:  RETINAL DETACHMENT LEFT EYE  PROCEDURE:  Procedure(s): VITRECTOMY 25 GAUGE WITH SCLERAL BUCKLE membrane peel, injection of silicone oil, and endolaser photocoagulation. (Left)  SURGEON:  Surgeon(s) and Role:    * [name], MD - Primary  PHYSICIAN ASSISTANT:   ASSISTANTS: Mindy Teschner   ANESTHESIA:   local and general  EBL:  Total I/O In: 1300 [I.V.:1300] Out: 3 [Blood:3]  BLOOD ADMINISTERED:none  DRAINS: none   LOCAL MEDICATIONS USED:  BUPIVICAINE , LIDOCAINE  and Amount: 5 ml  SPECIMEN:  No Specimen  DISPOSITION OF SPECIMEN:  N/A  COUNTS:  YES  TOURNIQUET:  * No tourniquets in log *  DICTATION: .Note written in EPIC  PLAN OF CARE: Discharge to home after PACU  PATIENT DISPOSITION:  PACU - hemodynamically stable.   Delay start of Pharmacological VTE agent (>24hrs) due to surgical blood loss or risk of bleeding: not applicable

## 2017-02-17 NOTE — Interval H&P Note (Signed)
History and Physical Interval Note:  02/17/2017 2:09 PM  Jerome Mays  has presented today for surgery, with the diagnosis of RETINAL DETACHMENT LEFT EYE  The various methods of treatment have been discussed with the patient and family. After consideration of risks, benefits and other options for treatment, the patient has consented to  Procedure(s): VITRECTOMY 25 GAUGE WITH SCLERAL BUCKLE (Left) as a surgical intervention .  The patient's history has been reviewed, patient examined, no change in status, stable for surgery.  I have reviewed the patient's chart and labs.  Questions were answered to the patient's satisfaction.     [name]

## 2017-02-17 NOTE — Anesthesia Postprocedure Evaluation (Signed)
Anesthesia Post Note  Patient: CAMILA NORVILLE  Procedure(s) Performed: Procedure(s) (LRB): VITRECTOMY 25 GAUGE WITH SCLERAL BUCKLE membrane peel, injection of silicone oil, and endolaser photocoagulation. (Left)     Patient location during evaluation: PACU Anesthesia Type: General Level of consciousness: awake Pain management: pain level controlled Vital Signs Assessment: post-procedure vital signs reviewed and stable Respiratory status: spontaneous breathing Cardiovascular status: stable Anesthetic complications: no    Last Vitals:  Vitals:   02/17/17 1057 02/17/17 2027  BP: (!) 170/72   Pulse: 65   Resp: 20   Temp: 36.5 C 36.8 C  SpO2: 97%     Last Pain:  Vitals:   02/17/17 1057  TempSrc: Oral                 Nihar Klus

## 2017-02-17 NOTE — Anesthesia Procedure Notes (Signed)
Procedure Name: Intubation Date/Time: 02/17/2017 4:04 PM Performed by: Garrison Columbus T Pre-anesthesia Checklist: Patient identified, Emergency Drugs available, Suction available and Patient being monitored Patient Re-evaluated:Patient Re-evaluated prior to induction Oxygen Delivery Method: Circle System Utilized Preoxygenation: Pre-oxygenation with 100% oxygen Induction Type: IV induction Ventilation: Mask ventilation without difficulty Laryngoscope Size: Miller and 2 Grade View: Grade I Tube type: Oral Tube size: 7.5 mm Number of attempts: 1 Airway Equipment and Method: Stylet and Oral airway Placement Confirmation: ETT inserted through vocal cords under direct vision,  positive ETCO2 and breath sounds checked- equal and bilateral Secured at: 22 cm Tube secured with: Tape Dental Injury: Teeth and Oropharynx as per pre-operative assessment

## 2017-02-17 NOTE — Op Note (Signed)
Date of procedure: 9.12.18  Surgeon: [name], MD, PhD  Assistant: Coralyn Helling  Pre-operative Diagnosis:  Macula off Rhegmatogenous Retinal Detachment, Left Eye Proliferative Vitreoretinopathy, Left Eye  Post-operative diagnosis:  Macula off Rhegmatogenous Retinal Detachment, Left Eye Proliferative Vitreoretinopathy, Left Eye  Anesthesia: GETA  Procedure: 1)   25 gauge pars plana vitrectomy, Left Eye CPT [id number] 2)   Scleral Buckle 3)     Perfluorocarbon 4)     Membrane peeling 5)   Fluid-air exchange 6)   Endolaser 7)   Injection of silicon oil, 5188 cs  Complications: none Estimated blood loss: minimal Specimens: none  Brief history:  The patient has a history of decreased vision in the affected eye, and on examination, was noted to have a retinal detachment, affecting activities of daily living. The risks, benefits, and alternatives were explained to the patient, including pain, bleeding, infection, loss of vision, double vision, droopy eyelids, and need for more surgeries. Informed consent was obtained from the patient and placed in the chart.    Procedure: The patient was brought to the preoperative holding area where the correct eye was confirmed and marked. The patient was then brought to the operating room where general endotracheal anesthesia was induced. A time-out was performed to identify the correct patient, eye, procedure, and any allergies. The eye was prepped and draped in the usual sterile ophthalmic fashion followed by placement of a lid speculum.  A 360 conjunctival peritomy was created using Westcott scissors and 0.12 forceps. Each of the four quadrants between the rectus muscles was dissected using Stevens scissors to detach Tenon's attachments from the globe. Each of the four rectus muscles was isolated on a muscle hook and slung using 2-0 Silk suture in the usual standard fashion. Each of the four quadrants between the rectus  muscles was inspected and there was a small area of scleral thinning in the superotemporal quadrant. There were no other areas of scleral thinning. A #41 silicone band was then brought onto the field and was threaded under each rectus muscle. The band was then loosely secured using a #70 Watzke sleeve in the inferonasal quadrant The band was then sutured to the sclera in each quadrant using 5-0 nylon sutures passed partial thickness through the sclera in a horizontal mattress fashion. The scleral buckle was then tightened to the appropriate height with two locking needle drivers. Attention was then drawn to the vitrectomy portion of the procedure.  A 25 gauge trocar was placed in the inferotemporal quadrant in a beveled fashion. A 4 mm infusion cannula was placed through this trocar, and the infusion cannula was confirmed in the vitreous cavity with no incarceration of retina or choroid prior to turning it on. Two additional 25 gauge trocars were placed in the superonasal and superotemporal quadrants (2 and 10 oclock, respectively) in a similar beveled fashion. At this time, anterior vitrectomy was performed under direct visualization. Then, a standard three-port pars plana vitrectomy was performed using the light pipe, the cutter, and the BIOM viewing system. A thorough core and peripheral vitreous dissection was performed. A posterior vitreous detachment was confirmed over the optic nerve.There was a subtotal, bullous retinal detachment inferiorly from 2-10 oclock with the fovea detached and just the superonasal macula attached. Of note, the detached retina very thin, stiff, atrophic with signficant corrugations as well as PVR starfolds. There were multiple flap tears inferiorly at 0430, 0500 and 0600 oclock.   Traction was removed from all retinal breaks. The breaks were trimmed using  the cutter to smooth the edges. Of note, the vitreous was extremely adherent to the mobile, detached retina. Perfluoron  was used to flatten the retina and assist with further peripheral vitreous trimming. Then 25g max grip forceps were used to peel preretinal PVR membranes along inferior arcades and further inferiorly.  An endolaser probe was brought onto the field and was used to apply 360 of panretinal photocoagulation over the buckle, as well as photocoagulation around all retinal breaks.  A complete fluid-air exchange was performed with a soft tip extrusion cannula over the 0500 break. After completion of these maneuvers, the posterior pole and peripheral retina were noted to be flat. Under air, additional endolaser was applied to the anterior peripheral retina.  At this time, the buckle height was confirmed and the buckle was finalized by trimming the band ends.A complete air to silicon oil exchange was performed with 5993 centistoke silicon oil. The three trocar were then removed and sutured with 7-0 vicryl in an interrupted fashion.A subtenon's block containing 0.75% marcaine and 2% lidocaine was administered. Kefzol + polymixin irrigation was then used over the buckle.  The conjunctiva was closed with 7-0 vicryl sutures. The eye was confirmed to be at a physiologic level by digital palpation. Subconjunctival injections of Antibiotic and kenalog were administered. The lid speculum and drapes were removed. Drops of an antibiotic, anti-ocular hypertensives and steroid were given. The eye was patched and shielded. The patient tolerated the procedure well without any intraoperative or immediate postoperative complications. The patient was taken to the recovery room in good condition to be discharged home. The patient was instructed to maintain a strict face-down position and will be seen by Dr. Coralyn Pear tomorrow morning.

## 2017-02-17 NOTE — Progress Notes (Signed)
[facility] Note  02/18/2017     CHIEF COMPLAINT Patient presents for Post-op Follow-up   HISTORY OF PRESENT ILLNESS: Jerome Mays is a 76 y.o. male who presents to the clinic today for:   HPI    Post-op Follow-up  In left eye.  Discomfort includes pain.  Negative for itching, foreign body sensation, tearing, discharge, floaters and none.  Vision is stable.  I, the attending physician,  performed the HPI with the patient and updated documentation appropriately.        Comments  POV 1; S/P SBP (42 band) + 25g PPV/PFC/EL/FAX/SO 1000 OS 9.12.18;  Pt states he has been face down since sx; pt states OS is sore when he raises his head; pt states that he has not removed patch; Pt denies headaches; denies nausea;      Last edited by [name], MD on 02/18/2017  1:33 PM. (History)      Referring physician: [name], MD [place], [place] [address]  HISTORICAL INFORMATION:   Selected notes from the MEDICAL RECORD NUMBER Initial referral from Dr. [name] for possible RD OS    CURRENT MEDICATIONS: Current Outpatient Prescriptions (Ophthalmic Drugs)  Medication Sig  . Polyethyl Glycol-Propyl Glycol (SYSTANE OP) Place 1 drop into both eyes as needed (for dry eyes).   No current facility-administered medications for this visit.  (Ophthalmic Drugs)   Current Outpatient Prescriptions (Other)  Medication Sig  . acetaminophen (TYLENOL 8 HOUR) 650 MG CR tablet Take 1,300 mg by mouth at bedtime.  [name] allopurinol (ZYLOPRIM) 300 MG tablet Take 300 mg by mouth daily.   [name] DALIRESP 500 MCG TABS tablet TAKE 1 TABLET BY MOUTH ONCE DAILY (Patient taking differently: TAKE 500 MCG BY MOUTH ONCE DAILY)  . Fluticasone-Salmeterol (ADVAIR DISKUS) 250-50 MCG/DOSE AEPB inhale 1 dose twice a day (Patient taking differently: Inhale 1 puff into the lungs 2 (two) times daily. )  . HYDROcodone-acetaminophen (NORCO/VICODIN) 5-325 MG tablet Take 1 tablet by  mouth every 4 (four) hours as needed for moderate pain.  . Multiple Vitamin (ONE-A-DAY MENS PO) Take 1 tablet by mouth daily.   No current facility-administered medications for this visit.  (Other)      REVIEW OF SYSTEMS: ROS    Positive for: Eyes   Negative for: Constitutional, Gastrointestinal, Neurological, Skin, Genitourinary, Musculoskeletal, HENT, Endocrine, Cardiovascular, Respiratory, Psychiatric, Allergic/Imm, Heme/Lymph   Last edited by Alyse Low on 02/18/2017  8:52 AM. (History)       ALLERGIES Allergies  Allergen Reactions  . Lipitor [Atorvastatin] Rash and Other (See Comments)    Passed out    PAST MEDICAL HISTORY Past Medical History:  Diagnosis Date  . Arthritis   . Colonic polyp   . COPD (chronic obstructive pulmonary disease) ([place])   . GERD (gastroesophageal reflux disease)   . Gout   . Malignant tumor of kidney ([place])   . Pain    LOWER BACK AND LEFT HIP - HX OF PREVIOUS LUMBAR AND CERVICAL SURGERY--PT STATES HE HAS HAD NUMBNESS IN BOTH FEET SINCE HIS BACK SURGERY  . Renal calculus   . Shortness of breath    Past Surgical History:  Procedure Laterality Date  . BACK SURGERY     LUMBAR SURGERY  . CATARACT EXTRACTION Bilateral 2003   Surgeon unknown  . EYE SURGERY     BILATERAL CATARACT EXTRACTIONS  . GIVENS CAPSULE STUDY N/A 10/07/2015   Procedure: GIVENS CAPSULE STUDY;  Surgeon: Carol Ada, MD;  Location: [facility];  Service: Endoscopy;  Laterality: N/A;  . HAND SURGERY     left  . KNEE SURGERY     right  . LAMINECTOMY     cervical  . NEPHRECTOMY     PT STATES RIGHT KIDNEY WAS REMOVED  . 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 TOTAL KNEE ARTHROPLASTY Left 10/19/2012   Procedure: LEFT TOTAL KNEE ARTHROPLASTY;  Surgeon: Magnus Sinning, MD;  Location: [facility];  Service: Orthopedics;  Laterality: Left;    FAMILY HISTORY Family History  Problem Relation Age of Onset  . Diabetes Mother   . COPD Brother   . COPD Sister   . Amblyopia  Neg Hx   . Blindness Neg Hx   . Cataracts Neg Hx   . Glaucoma Neg Hx   . Macular degeneration Neg Hx   . Retinal detachment Neg Hx   . Retinitis pigmentosa Neg Hx     SOCIAL HISTORY Social History  Substance Use Topics  . Smoking status: Former Smoker    Packs/day: 2.00    Years: 40.00    Types: Cigarettes, Pipe    Quit date: 06/09/1995  . Smokeless tobacco: Never Used  . Alcohol use No         OPHTHALMIC EXAM:  Base Eye Exam    Visual Acuity (Snellen - Linear)      Right Left   Dist [place] 20/40 CF at 3'   Dist ph [place] 20/30 NI       Tonometry (Tonopen, 8:59 AM)      Right Left   Pressure 16 25       Pupils      Dark Shape APD   Right      Left 6 Round pharm dilated       Neuro/Psych    Oriented x3:  Yes   Mood/Affect:  Normal       Dilation    Left eye:  1.0% Mydriacyl, 2.5% Phenylephrine @ 8:59 AM        Slit Lamp and Fundus Exam    External Exam      Right Left   External Normal Periorbital edema       Slit Lamp Exam      Right Left   Lids/Lashes Dermatochalasis - upper lid Dermatochalasis - upper lid   Conjunctiva/Sclera White and quiet Sutures intact; Subconjunctival hemorrhage   Cornea Clear central epi defect   Anterior Chamber Deep and quiet Deep and quiet   Iris Round and dilated Round and dilated - dilation 28m   Lens Three piece Posterior chamber intraocular lens Three piece Posterior chamber intraocular lens   Vitreous Vitreous syneresis Post vitrectomy with silicone oil fill       Fundus Exam      Right Left   Disc  Normal   C/D Ratio  0.45   Macula  Flat under oil   Vessels  Vascular attenuation - mild   Periphery  moderate buckle height; retina attached over buckle with inferior retinal tears on buckle; good laser around inferior breaks and on buckle;          IMAGING AND PROCEDURES  Imaging and Procedures for 02/18/17           ASSESSMENT/PLAN:    ICD-10-CM   1. 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 -  inferior detachment from 2-10 oclock with HST at ~530 - fovea off, but sup nasal macula attached - by history RD likely started 4-5 wks ago, but fovea became involved ~Saturday, 02/13/17 - now POD1 s/p SBP (42 band) + 25g PPV/PFC/EL/FAX/C3F8 OS 9.12.18             - doing well this morning             - retina attached and in good position under oil -- good buckle height and laser around breaks             - IOP mildly elevated             - start   PF 4x/day OD                         zymaxid QID OD                         Atropine BID OD                         Alphagan P BID OD                         Cosopt BID OD                         Polysporin ung QID OD             - cont face down positioning x3 days; avoid laying flat on back             - eye shield when sleeping             - post op drop and positioning instructions reviewed             - tylenol/ibuprofen for pain             - Rx given for breakthrough pain - f/u 1 wk for POV2  3. Pseudophakia OU - IOLs in good position OU - monitor  Ophthalmic Meds Ordered this visit:  No orders of the defined types were placed in this encounter.      Return in about 1 week (around 02/25/2017) for POV.  Patient Instructions  Post-operative Care         Retina and Vitreous surgery  Care of your eye: . Please do not rub your eye, you may damage the eye. Wendee Copp your hands and use clean tissues each time you blot the eye . You may take Extra Strength Tylenol if needed for discomfort.  Call for an appointment if the pain becomes severe and is not improved with the medications given on discharge from [facility]. . It is normal for the eye to be red, swollen and bloodshot.  You will have red-tinged tears.  Activities:  Bed rest or moderate activity is recommended for the first week.  Do not  strain yourself or lift more than ten pounds.    You May: Watch television, walk up and down stairs, eat in a restaurant and ride in a car  Bathing:   Do not get your eye wet for 4 days. You may bathe without getting the eye wet. You may wash your hair by lying backward or forward at the sink.  Gas bubble: If you have a gas bubble, you may  not lie on your back.  You must not fly in an airplane as long as the bubble is in your eye. As the gas bubble reabsorbs, you will see the bubble as a dark circle in the lower part of your vision. It moves when you tilt your head. Maintain the following position(s):  ? Face Down ? Right Side Down ? Left Side Down ? Gaze Down ? Sitting up  ? Any position  Post Operative Appointment: Please call my office at [phone] for a one-week follow up appointment if one was not given to you before surgery.  Dressing:  Please wear the hard eye shield when sleeping or napping for two weeks.  You may include the pad if needed.  MEDICATION INSTRUCTIONS Place the following drops in the operative eye: ____Zymaxid (gatifloxacin)  Tan cap  4 times daily ____Alphagan (brimonidine)  Purple cap  2 times daily ____Cosopt (dorzolamide-timolol) Dark blue cap  2 times daily ____Atropine 1%   Red cap  2 times daily ____Prednisolone 1%   White or pink cap 4 times daily ____Polysporin Ophth Ointment Tube   4 times daily  Oral Mediactions: ____Prednisone _____mg each morning with 15cc Mylanta  *Please wait 2-3 minutes between eye drops if they are scheduled at the same time *Please continue all eye drops in the non-operative eye *Resume all of your other medications as before your surgery     Explained the diagnoses, plan, and follow up with the patient and they expressed understanding.  Patient expressed understanding of the importance of proper follow up care.   Gardiner Sleeper, M.D., Ph.D. Vitreoretinal Surgeon [facility] 02/18/17     Abbreviations: M myopia (nearsighted); A astigmatism; H hyperopia (farsighted); P presbyopia; Mrx spectacle prescription;  CTL contact lenses; OD right eye; OS left eye; OU both eyes  XT exotropia; ET esotropia; PEK punctate epithelial keratitis; PEE punctate epithelial erosions; DES dry eye syndrome; MGD meibomian gland dysfunction; ATs artificial tears; PFAT's preservative free artificial tears; [place] nuclear sclerotic cataract; PSC posterior subcapsular cataract; ERM epi-retinal membrane; PVD posterior vitreous detachment; RD retinal detachment; DM diabetes mellitus; DR diabetic retinopathy; NPDR non-proliferative diabetic retinopathy; PDR proliferative diabetic retinopathy; CSME clinically significant macular edema; DME diabetic macular edema; dbh dot blot hemorrhages; CWS cotton wool spot; POAG primary open angle glaucoma; C/D cup-to-disc ratio; HVF humphrey visual field; GVF goldmann visual field; OCT optical coherence tomography; IOP intraocular pressure; BRVO Branch retinal vein occlusion; CRVO central retinal vein occlusion; CRAO central retinal artery occlusion; BRAO branch retinal artery occlusion; RT retinal tear; SB scleral buckle; PPV pars plana vitrectomy; VH Vitreous hemorrhage; PRP panretinal laser photocoagulation; IVK intravitreal kenalog; VMT vitreomacular traction; MH Macular hole;  NVD neovascularization of the disc; NVE neovascularization elsewhere; AREDS age related eye disease study; ARMD age related macular degeneration; POAG primary open angle glaucoma; EBMD epithelial/anterior basement membrane dystrophy; ACIOL anterior chamber intraocular lens; IOL intraocular lens; PCIOL posterior chamber intraocular lens; Phaco/IOL phacoemulsification with intraocular lens placement; [place] photorefractive keratectomy; LASIK laser assisted in situ keratomileusis; HTN hypertension; DM diabetes mellitus; COPD chronic obstructive pulmonary disease

## 2017-02-17 NOTE — OR Nursing (Signed)
Phone call made to volunteer to update patient's wife that everything is going fine per MD request.

## 2017-02-18 ENCOUNTER — Encounter (INDEPENDENT_AMBULATORY_CARE_PROVIDER_SITE_OTHER): Payer: Self-pay | Admitting: Ophthalmology

## 2017-02-18 ENCOUNTER — Ambulatory Visit (INDEPENDENT_AMBULATORY_CARE_PROVIDER_SITE_OTHER): Payer: Medicare Other | Admitting: Ophthalmology

## 2017-02-18 DIAGNOSIS — H3522 Other non-diabetic proliferative retinopathy, left eye: Secondary | ICD-10-CM

## 2017-02-18 DIAGNOSIS — H3322 Serous retinal detachment, left eye: Secondary | ICD-10-CM

## 2017-02-18 DIAGNOSIS — Z961 Presence of intraocular lens: Secondary | ICD-10-CM

## 2017-02-18 NOTE — Patient Instructions (Signed)
Post-operative Care         Retina and Vitreous surgery  Care of your eye: . Please do not rub your eye, you may damage the eye. Wendee Copp your hands and use clean tissues each time you blot the eye . You may take Extra Strength Tylenol if needed for discomfort.  Call for an appointment if the pain becomes severe and is not improved with the medications given on discharge from [facility]. . It is normal for the eye to be red, swollen and bloodshot.  You will have red-tinged tears.  Activities:  Bed rest or moderate activity is recommended for the first week.  Do not strain yourself or lift more than ten pounds.    You May: Watch television, walk up and down stairs, eat in a restaurant and ride in a car  Bathing:   Do not get your eye wet for 4 days. You may bathe without getting the eye wet. You may wash your hair by lying backward or forward at the sink.  Gas bubble: If you have a gas bubble, you may not lie on your back.  You must not fly in an airplane as long as the bubble is in your eye. As the gas bubble reabsorbs, you will see the bubble as a dark circle in the lower part of your vision. It moves when you tilt your head. Maintain the following position(s):  ? Face Down ? Right Side Down ? Left Side Down ? Gaze Down ? Sitting up  ? Any position  Post Operative Appointment: Please call my office at [phone] for a one-week follow up appointment if one was not given to you before surgery.  Dressing:  Please wear the hard eye shield when sleeping or napping for two weeks.  You may include the pad if needed.  MEDICATION INSTRUCTIONS Place the following drops in the operative eye: ____Zymaxid (gatifloxacin)  Tan cap  4 times daily ____Alphagan (brimonidine)  Purple cap  2 times daily ____Cosopt (dorzolamide-timolol) Dark blue cap  2 times daily ____Atropine 1%   Red cap  2 times daily ____Prednisolone 1%   White or pink cap 4 times daily ____Polysporin Ophth Ointment Tube   4 times  daily  Oral Mediactions: ____Prednisone _____mg each morning with 15cc Mylanta  *Please wait 2-3 minutes between eye drops if they are scheduled at the same time *Please continue all eye drops in the non-operative eye *Resume all of your other medications as before your surgery

## 2017-02-22 NOTE — Progress Notes (Signed)
[facility] Note  02/24/2017     CHIEF COMPLAINT Patient presents for Post-op Follow-up   HISTORY OF PRESENT ILLNESS: Jerome Mays is a 76 y.o. male who presents to the clinic today for:   HPI    Post-op Follow-up  In left eye.  Discomfort includes pain and tearing.  Negative for itching, foreign body sensation, discharge and floaters.        Comments  POV 2; S/P SBP (42 band) + 25g PPV/PFC/EL/FAX/C3F8 OS 9.12.18; Patient states he feels the left eye is a little better but not much. He does not have floaters now Continues with eye qtts as instructed . Patient states his left eye feels like he has eye lash in inner corner of his eye at times .     Last edited by Alyse Low on 02/24/2017  9:04 AM. (History)      Referring physician: [name], MD [place], [place] [address]  HISTORICAL INFORMATION:   Selected notes from the MEDICAL RECORD NUMBER Initial referral from Dr. [name] for possible RD OS    CURRENT MEDICATIONS: Current Outpatient Prescriptions (Ophthalmic Drugs)  Medication Sig  . Polyethyl Glycol-Propyl Glycol (SYSTANE OP) Place 1 drop into both eyes as needed (for dry eyes).  . prednisoLONE acetate (PRED FORTE) 1 % ophthalmic suspension Place 1 drop into the left eye 6 (six) times daily.   No current facility-administered medications for this visit.  (Ophthalmic Drugs)   Current Outpatient Prescriptions (Other)  Medication Sig  . acetaminophen (TYLENOL 8 HOUR) 650 MG CR tablet Take 1,300 mg by mouth at bedtime.  [name] allopurinol (ZYLOPRIM) 300 MG tablet Take 300 mg by mouth daily.   [name] DALIRESP 500 MCG TABS tablet TAKE 1 TABLET BY MOUTH ONCE DAILY (Patient taking differently: TAKE 500 MCG BY MOUTH ONCE DAILY)  . Fluticasone-Salmeterol (ADVAIR DISKUS) 250-50 MCG/DOSE AEPB inhale 1 dose twice a day (Patient taking differently: Inhale 1 puff into the lungs 2 (two) times daily. )  . HYDROcodone-acetaminophen  (NORCO/VICODIN) 5-325 MG tablet Take 1 tablet by mouth every 4 (four) hours as needed for moderate pain.  . Multiple Vitamin (ONE-A-DAY MENS PO) Take 1 tablet by mouth daily.   No current facility-administered medications for this visit.  (Other)      REVIEW OF SYSTEMS: ROS    Positive for: Eyes   Negative for: Constitutional, Gastrointestinal, Neurological, Skin, Genitourinary, Musculoskeletal, HENT, Endocrine, Cardiovascular, Respiratory, Psychiatric, Allergic/Imm, Heme/Lymph   Last edited by Alyse Low on 02/24/2017  9:03 AM. (History)       ALLERGIES Allergies  Allergen Reactions  . Lipitor [Atorvastatin] Rash and Other (See Comments)    Passed out    PAST MEDICAL HISTORY Past Medical History:  Diagnosis Date  . Arthritis   . Colonic polyp   . COPD (chronic obstructive pulmonary disease) ([place])   . GERD (gastroesophageal reflux disease)   . Gout   . Malignant tumor of kidney ([place])   . Pain    LOWER BACK AND LEFT HIP - HX OF PREVIOUS LUMBAR AND CERVICAL SURGERY--PT STATES HE HAS HAD NUMBNESS IN BOTH FEET SINCE HIS BACK SURGERY  . Renal calculus   . Shortness of breath    Past Surgical History:  Procedure Laterality Date  . BACK SURGERY     LUMBAR SURGERY  . CATARACT EXTRACTION Bilateral 2003   Surgeon unknown  . EYE SURGERY     BILATERAL CATARACT EXTRACTIONS  . GIVENS CAPSULE STUDY N/A 10/07/2015  Procedure: GIVENS CAPSULE STUDY;  Surgeon: Carol Ada, MD;  Location[facility] END[facility]COPY;  Service: Endoscopy;  Laterality: N/A;  . HAND SURGERY     left  . KNEE SURGERY     right  . LAMINECTOMY     cervical  . NEPHRECTOMY     PT STATES RIGHT KIDNEY WAS REMOVED  . 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 TOTAL KNEE ARTHROPLASTY Left 10/19/2012   Procedure: LEFT TOTAL KNEE ARTHROPLASTY;  Surgeon: Magnus Sinning, MD;  Location: [facility] ORS;  Service: Orthopedics;  Laterality: Left;  [name] VITRECTOMY 25 GAUGE WITH SCLERAL BUCKLE Left 02/17/2017   Procedure: VITRECTOMY 25  GAUGE WITH SCLERAL BUCKLE membrane peel, injection of silicone oil, and endolaser photocoagulation.;  Surgeon: [name], MD;  Location: [facility];  Service: Ophthalmology;  Laterality: Left;    FAMILY HISTORY Family History  Problem Relation Age of Onset  . Diabetes Mother   . COPD Brother   . COPD Sister   . Amblyopia Neg Hx   . Blindness Neg Hx   . Cataracts Neg Hx   . Glaucoma Neg Hx   . Macular degeneration Neg Hx   . Retinal detachment Neg Hx   . Retinitis pigmentosa Neg Hx     SOCIAL HISTORY Social History  Substance Use Topics  . Smoking status: Former Smoker    Packs/day: 2.00    Years: 40.00    Types: Cigarettes, Pipe    Quit date: 06/09/1995  . Smokeless tobacco: Never Used  . Alcohol use No         OPHTHALMIC EXAM:  Base Eye Exam    Visual Acuity (Snellen - Linear)      Right Left   Dist [place] 20/25 +1 CF at 3'   Dist ph [place] 20/25 +1 ni       Tonometry (Applanation, 8:46 AM)      Right Left   Pressure 15 20   Done by MCR; staining OS prior to TA       Pupils      Dark Light Shape React APD   Right 4 3 Round Brisk None   Left Pharm Dilated       Rechecked by [facility]       Extraocular Movement      Right Left    Full Full       Neuro/Psych    Oriented x3:  Yes   Mood/Affect:  Normal       Dilation    Left eye:  1.0% Mydriacyl, 2.5% Phenylephrine @ 9:24 AM        Slit Lamp and Fundus Exam    External Exam      Right Left   External Normal Periorbital edema improving       Slit Lamp Exam      Right Left   Lids/Lashes Dermatochalasis - upper lid Dermatochalasis - upper lid   Conjunctiva/Sclera White and quiet Sutures intact; Subconjunctival hemorrhage; , 3+ Injection   Cornea Clear central epi defect closed; 3-4+ K edema with descemet folds; 3+ PEE   Anterior Chamber Deep and quiet Deep; 1+ cell/pigment   Iris Round and dilated Round and dilated - dilation 61m   Lens Three piece Posterior chamber intraocular lens Three piece Posterior  chamber intraocular lens; condensation on posterior surface of lens -- ?PFO   Vitreous Vitreous syneresis Post vitrectomy with 898-92%silicone oil fill;        Fundus Exam      Right Left  Disc  Normal   C/D Ratio  0.45   Macula  Flat under oil   Vessels  Vascular attenuation - mild   Periphery  moderate buckle height; retina attached over buckle with inferior retinal tears on buckle; laser around inferior breaks and on buckle;          IMAGING AND PROCEDURES  Imaging and Procedures for 02/24/17  OCT, Retina - OU - Both Eyes     Right Eye Quality was good. Central Foveal Thickness: 251. Progression has been stable. Findings include normal foveal contour, no IRF, no SRF.   Left Eye Quality was good. Progression has improved. Findings include (Retina attached under oil).   Notes Images taken, stored on drive   Diagnosis / Impression:  OS: retina / fovea reattached under oil -- SRF resolved  Clinical management:  See below  Abbreviations: NFP - Normal foveal profile. CME - cystoid macular edema. PED - pigment epithelial detachment. IRF - intraretinal fluid. SRF - subretinal fluid. EZ - ellipsoid zone. ERM - epiretinal membrane. ORA - outer retinal atrophy. ORT - outer retinal tubulation. SRHM - subretinal hyper-reflective material                ASSESSMENT/PLAN:    ICD-10-CM   1. 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 - inferior detachment from 2-10 oclock with HST at ~530 - fovea off, but sup nasal macula attached - by history RD likely started 4-5 wks prior to presentation, but fovea became involved ~Saturday, 02/13/17 - now POD7 s/p SBP (42 band) + 25g PPV/PFC/EL/FAX/C3F8 OS 9.12.18             - did well this week; mild discomfort             - retina attached  and in good position under oil -- good buckle height and laser around breaks  - significant corneal             - IOP mildly elevated             - increase PF to 6x/day OS  - continue                         zymaxid QID OS -- STOP on FRIDAY                         Atropine BID OS-- STOP on FRIDAY                         Alphagan P BID OS                         Cosopt BID OS                         Polysporin ung qhs, prn OS  - start ATs QID + PRN OS             - can d/c face down positioning; avoid laying flat on back             - eye shield when sleeping             -  post op drop and positioning instructions reviewed             - tylenol/ibuprofen for pain             - Rx given for breakthrough pain - f/u for POV3  3. Pseudophakia OU - IOLs in good position OU - monitor  Ophthalmic Meds Ordered this visit:  Meds ordered this encounter  Medications  . prednisoLONE acetate (PRED FORTE) 1 % ophthalmic suspension    Sig: Place 1 drop into the left eye 6 (six) times daily.    Dispense:  15 mL    Refill:  0       Return in about 3 weeks (around 03/17/2017) for POV.  There are no Patient Instructions on file for this visit.   Explained the diagnoses, plan, and follow up with the patient and they expressed understanding.  Patient expressed understanding of the importance of proper follow up care.   Gardiner Sleeper, M.D., Ph.D. Vitreoretinal Surgeon [facility] 02/24/17     Abbreviations: M myopia (nearsighted); A astigmatism; H hyperopia (farsighted); P presbyopia; Mrx spectacle prescription;  CTL contact lenses; OD right eye; OS left eye; OU both eyes  XT exotropia; ET esotropia; PEK punctate epithelial keratitis; PEE punctate epithelial erosions; DES dry eye syndrome; MGD meibomian gland dysfunction; ATs artificial tears; PFAT's preservative free artificial tears; [place] nuclear sclerotic cataract; PSC posterior subcapsular cataract; ERM  epi-retinal membrane; PVD posterior vitreous detachment; RD retinal detachment; DM diabetes mellitus; DR diabetic retinopathy; NPDR non-proliferative diabetic retinopathy; PDR proliferative diabetic retinopathy; CSME clinically significant macular edema; DME diabetic macular edema; dbh dot blot hemorrhages; CWS cotton wool spot; POAG primary open angle glaucoma; C/D cup-to-disc ratio; HVF humphrey visual field; GVF goldmann visual field; OCT optical coherence tomography; IOP intraocular pressure; BRVO Branch retinal vein occlusion; CRVO central retinal vein occlusion; CRAO central retinal artery occlusion; BRAO branch retinal artery occlusion; RT retinal tear; SB scleral buckle; PPV pars plana vitrectomy; VH Vitreous hemorrhage; PRP panretinal laser photocoagulation; IVK intravitreal kenalog; VMT vitreomacular traction; MH Macular hole;  NVD neovascularization of the disc; NVE neovascularization elsewhere; AREDS age related eye disease study; ARMD age related macular degeneration; POAG primary open angle glaucoma; EBMD epithelial/anterior basement membrane dystrophy; ACIOL anterior chamber intraocular lens; IOL intraocular lens; PCIOL posterior chamber intraocular lens; Phaco/IOL phacoemulsification with intraocular lens placement; [place] photorefractive keratectomy; LASIK laser assisted in situ keratomileusis; HTN hypertension; DM diabetes mellitus; COPD chronic obstructive pulmonary disease

## 2017-02-24 ENCOUNTER — Encounter (INDEPENDENT_AMBULATORY_CARE_PROVIDER_SITE_OTHER): Payer: Self-pay | Admitting: Ophthalmology

## 2017-02-24 ENCOUNTER — Ambulatory Visit (INDEPENDENT_AMBULATORY_CARE_PROVIDER_SITE_OTHER): Payer: Medicare Other | Admitting: Ophthalmology

## 2017-02-24 DIAGNOSIS — Z961 Presence of intraocular lens: Secondary | ICD-10-CM

## 2017-02-24 DIAGNOSIS — H3322 Serous retinal detachment, left eye: Secondary | ICD-10-CM

## 2017-02-24 DIAGNOSIS — H3522 Other non-diabetic proliferative retinopathy, left eye: Secondary | ICD-10-CM

## 2017-02-24 MED ORDER — PREDNISOLONE ACETATE 1 % OP SUSP: 1.0000 [drp] | Freq: Every day | OPHTHALMIC | 0 refills | Status: DC

## 2017-02-26 ENCOUNTER — Other Ambulatory Visit: Payer: Self-pay | Admitting: Pulmonary Disease

## 2017-03-01 ENCOUNTER — Telehealth (INDEPENDENT_AMBULATORY_CARE_PROVIDER_SITE_OTHER): Payer: Self-pay | Admitting: Ophthalmology

## 2017-03-01 MED ORDER — BACITRACIN-POLYMYXIN B 500-10000 UNIT/GM OP OINT: TOPICAL_OINTMENT | Freq: Two times a day (BID) | OPHTHALMIC | 0 refills | Status: DC

## 2017-03-01 MED ORDER — DORZOLAMIDE HCL-TIMOLOL MAL 2-0.5 % OP SOLN: 1.0000 [drp] | Freq: Two times a day (BID) | OPHTHALMIC | 1 refills | Status: DC

## 2017-03-01 MED ORDER — BRIMONIDINE TARTRATE 0.2 % OP SOLN: 1.0000 [drp] | Freq: Two times a day (BID) | OPHTHALMIC | 1 refills | Status: DC

## 2017-03-01 NOTE — Telephone Encounter (Signed)
Pt wife called requesting refill on cosopt, alphagan, and poly ung; Sent refill rx to [facility] in [place] at pt request;

## 2017-03-02 ENCOUNTER — Telehealth (INDEPENDENT_AMBULATORY_CARE_PROVIDER_SITE_OTHER): Payer: Self-pay | Admitting: Ophthalmology

## 2017-03-02 MED ORDER — BACITRACIN-POLYMYXIN B 500-10000 UNIT/GM OP OINT
TOPICAL_OINTMENT | Freq: Two times a day (BID) | OPHTHALMIC | 0 refills | Status: AC
Start: 2017-03-02 — End: 2017-03-12

## 2017-03-02 MED ORDER — BRIMONIDINE TARTRATE 0.2 % OP SOLN: 1.0000 [drp] | Freq: Two times a day (BID) | OPHTHALMIC | 1 refills | Status: DC

## 2017-03-02 MED ORDER — PREDNISOLONE ACETATE 1 % OP SUSP: 1.0000 [drp] | Freq: Every day | OPHTHALMIC | 0 refills | Status: DC

## 2017-03-02 NOTE — Telephone Encounter (Signed)
Pt's wife called and states drops are on back order at pharmacy on file, requested drop refills be sent to [facility] in [place]. Drops and ointment e-scribed per request.  Gardiner Sleeper, M.D., Ph.D. Diseases & Surgery of the Retina and [place]

## 2017-03-16 NOTE — Progress Notes (Signed)
[facility] Note  03/17/2017     CHIEF COMPLAINT Patient presents for Post-op Follow-up   HISTORY OF PRESENT ILLNESS: Jerome Mays is a 76 y.o. male who presents to the clinic today for:   HPI    Post-op Follow-up  In left eye.  Discomfort includes none.  Vision is stable.  I, the attending physician,  performed the HPI with the patient and updated documentation appropriately.        Comments  POW 4; s/p SBP (42 band) + 25g PPV/PFC/EL/FAX/C3F8 OS 9.12.18;  Pt states there has been no change; Pt reports that he is still unable to see anything; Pt denies ocular pain, denies floaters, denies flashes, denies wavy VA; Pt reports taking gtts as directed;      Last edited by [name], MD on 03/17/2017  8:53 AM. (History)      Referring physician: [name], MD [place], [place] [address]  HISTORICAL INFORMATION:   Selected notes from the MEDICAL RECORD NUMBER Initial referral from Dr. [name] for possible RD OS    CURRENT MEDICATIONS: Current Outpatient Prescriptions (Ophthalmic Drugs)  Medication Sig  . brimonidine (ALPHAGAN) 0.2 % ophthalmic solution Place 1 drop into the left eye 2 (two) times daily.  . dorzolamide-timolol (COSOPT) 22.3-6.8 MG/ML ophthalmic solution Place 1 drop into the left eye 2 (two) times daily.  Vladimir Faster Glycol-Propyl Glycol (SYSTANE OP) Place 1 drop into both eyes as needed (for dry eyes).  . prednisoLONE acetate (PRED FORTE) 1 % ophthalmic suspension Place 1 drop into the left eye 6 (six) times daily.   No current facility-administered medications for this visit.  (Ophthalmic Drugs)   Current Outpatient Prescriptions (Other)  Medication Sig  . acetaminophen (TYLENOL 8 HOUR) 650 MG CR tablet Take 1,300 mg by mouth at bedtime.  [name] ADVAIR DISKUS 250-50 MCG/DOSE AEPB INHALE 1 PUFF INTO LUNGS TWICE DAILY  . allopurinol (ZYLOPRIM) 300 MG tablet Take 300 mg by mouth daily.   [name] DALIRESP 500 MCG  TABS tablet TAKE 1 TABLET BY MOUTH ONCE DAILY (Patient taking differently: TAKE 500 MCG BY MOUTH ONCE DAILY)  . HYDROcodone-acetaminophen (NORCO/VICODIN) 5-325 MG tablet Take 1 tablet by mouth every 4 (four) hours as needed for moderate pain.  . Multiple Vitamin (ONE-A-DAY MENS PO) Take 1 tablet by mouth daily.   No current facility-administered medications for this visit.  (Other)      REVIEW OF SYSTEMS: ROS    Positive for: Musculoskeletal, HENT, Eyes   Negative for: Constitutional, Gastrointestinal, Neurological, Skin, Genitourinary, Endocrine, Cardiovascular, Respiratory, Psychiatric, Allergic/Imm, Heme/Lymph   Last edited by Alyse Low on 03/17/2017  7:52 AM. (History)       ALLERGIES Allergies  Allergen Reactions  . Lipitor [Atorvastatin] Rash and Other (See Comments)    Passed out    PAST MEDICAL HISTORY Past Medical History:  Diagnosis Date  . Arthritis   . Colonic polyp   . COPD (chronic obstructive pulmonary disease) ([place])   . GERD (gastroesophageal reflux disease)   . Gout   . Malignant tumor of kidney ([place])   . Pain    LOWER BACK AND LEFT HIP - HX OF PREVIOUS LUMBAR AND CERVICAL SURGERY--PT STATES HE HAS HAD NUMBNESS IN BOTH FEET SINCE HIS BACK SURGERY  . Renal calculus   . Shortness of breath    Past Surgical History:  Procedure Laterality Date  . BACK SURGERY     LUMBAR SURGERY  . CATARACT EXTRACTION Bilateral 2003  Surgeon unknown  . EYE SURGERY     BILATERAL CATARACT EXTRACTIONS  . GIVENS CAPSULE STUDY N/A 10/07/2015   Procedure: GIVENS CAPSULE STUDY;  Surgeon: Carol Ada, MD;  Location: [facility];  Service: Endoscopy;  Laterality: N/A;  . HAND SURGERY     left  . KNEE SURGERY     right  . LAMINECTOMY     cervical  . NEPHRECTOMY     PT STATES RIGHT KIDNEY WAS REMOVED  . 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 TOTAL KNEE ARTHROPLASTY Left 10/19/2012   Procedure: LEFT TOTAL KNEE ARTHROPLASTY;  Surgeon: Magnus Sinning, MD;  Location:  [facility];  Service: Orthopedics;  Laterality: Left;  [name] VITRECTOMY 25 GAUGE WITH SCLERAL BUCKLE Left 02/17/2017   Procedure: VITRECTOMY 25 GAUGE WITH SCLERAL BUCKLE membrane peel, injection of silicone oil, and endolaser photocoagulation.;  Surgeon: [name], MD;  Location: [facility];  Service: Ophthalmology;  Laterality: Left;    FAMILY HISTORY Family History  Problem Relation Age of Onset  . Diabetes Mother   . COPD Brother   . COPD Sister   . Amblyopia Neg Hx   . Blindness Neg Hx   . Cataracts Neg Hx   . Glaucoma Neg Hx   . Macular degeneration Neg Hx   . Retinal detachment Neg Hx   . Retinitis pigmentosa Neg Hx     SOCIAL HISTORY Social History  Substance Use Topics  . Smoking status: Former Smoker    Packs/day: 2.00    Years: 40.00    Types: Cigarettes, Pipe    Quit date: 06/09/1995  . Smokeless tobacco: Never Used  . Alcohol use No         OPHTHALMIC EXAM:  Base Eye Exam    Visual Acuity (Snellen - Linear)      Right Left   Dist [place] 20/25 CF at 3'   Dist ph [place] NI NI       Tonometry (Tonopen, 7:56 AM)      Right Left   Pressure 14 09       Pupils      Dark Shape React APD   Right 2 Round Minimal None   Left 4 Round Minimal None       Extraocular Movement      Right Left    Full Full       Neuro/Psych    Oriented x3:  Yes   Mood/Affect:  Normal       Dilation    Left eye:  1.0% Mydriacyl, 2.5% Phenylephrine @ 7:56 AM        Slit Lamp and Fundus Exam    External Exam      Right Left   External Normal Periorbital edema improving       Slit Lamp Exam      Right Left   Lids/Lashes Dermatochalasis - upper lid Dermatochalasis - upper lid   Conjunctiva/Sclera White and quiet Sutures intact; Chemosis - temporally   Cornea Clear 1+ K edema with descemet folds; 2+ PEE; no epi defect   Anterior Chamber Deep and quiet Deep; 0.5+ cell/pigment   Iris Round and dilated Round and dilated - dilation 52m   Lens Three piece Posterior chamber intraocular  lens Three piece Posterior chamber intraocular lens; condensation on posterior surface of lens -- ?PFO   Vitreous Vitreous syneresis Post vitrectomy with 856%silicone oil fill;        Fundus Exam      Right Left   Disc  Normal   C/D Ratio  0.45   Macula  inf detachment; extensive PVR along inferior temporal arcades   Vessels  Vascular attenuation - mild   Periphery  moderate buckle height; retina attached over buckle with inferior retinal tears on buckle; laser around inferior breaks and on buckle;          IMAGING AND PROCEDURES  Imaging and Procedures for 03/17/17  Color Fundus Photography Optos - OS - Left Eye     Disc findings include normal observations. Macula : (PVR along inf arcade; +SRF). Vessels : (Inf temp traction). Periphery : (PVR detachment inf temp quad).               ASSESSMENT/PLAN:    ICD-10-CM   1. 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 - inferior detachment from 2-10 oclock with HST at ~530 - fovea off, but sup nasal macula attached - by history RD likely started 4-5 wks prior to presentation, but fovea became involved ~Saturday, 02/13/17 - now POW 4 s/p SBP (42 band) + 25g PPV/PFC/EL/FAX/C3F8 OS 9.12.18 - was doing well, but noted loss of vision superiorly about 2 wks ago - corneal edema improving - extensive PVR along inf temp arcades with tractional redetachment inf temp - discussed findings and prognosis - recommend tractional/PVR retinal detachment repair -- PPV w/ membrane peel/PFC/SOX OS under GA  Will need ICG, max grip forceps, membrane loop - RBA of procedure discussed, questions answered - informed consent obtained and signed - case scheduled for Monday, 03/22/17, [facility] OR 8, under general anesthesia - PF  4x/day OS - IOP lower -- stop cosopt, cont alphagan P BID OS - cont Polysporin ung qhs, prn OS - cont ATs QID + PRN OS - can d/c face down positioning; avoid laying flat on back - eye shield when sleeping - post op drop and positioning instructions reviewed - f/u Tuesday for POV  3. Pseudophakia OU - IOLs in good position OU - monitor  Ophthalmic Meds Ordered this visit:  No orders of the defined types were placed in this encounter.      Return in about 6 days (around 03/23/2017) for POV.  There are no Patient Instructions on file for this visit.   Explained the diagnoses, plan, and follow up with the patient and they expressed understanding.  Patient expressed understanding of the importance of proper follow up care.   Gardiner Sleeper, M.D., Ph.D. Vitreoretinal Surgeon [facility] 03/17/17     Abbreviations: M myopia (nearsighted); A astigmatism; H hyperopia (farsighted); P presbyopia; Mrx spectacle prescription;  CTL contact lenses; OD right eye; OS left eye; OU both eyes  XT exotropia; ET esotropia; PEK punctate epithelial keratitis; PEE punctate epithelial erosions; DES dry eye syndrome; MGD meibomian gland dysfunction; ATs artificial tears; PFAT's preservative free artificial tears; [place] nuclear sclerotic cataract; PSC posterior subcapsular cataract; ERM epi-retinal membrane; PVD posterior vitreous detachment; RD retinal detachment; DM diabetes mellitus; DR diabetic retinopathy; NPDR non-proliferative diabetic retinopathy; PDR proliferative diabetic retinopathy; CSME clinically significant macular edema; DME diabetic macular edema; dbh dot blot hemorrhages; CWS cotton wool spot; POAG primary open angle glaucoma; C/D cup-to-disc ratio; HVF humphrey visual field; GVF goldmann visual field; OCT optical coherence tomography; IOP intraocular pressure; BRVO  Branch retinal vein occlusion; CRVO central retinal vein occlusion; CRAO central retinal artery  occlusion; BRAO branch retinal artery occlusion; RT retinal tear; SB scleral buckle; PPV pars plana vitrectomy; VH Vitreous hemorrhage; PRP panretinal laser photocoagulation; IVK intravitreal kenalog; VMT vitreomacular traction; MH Macular hole;  NVD neovascularization of the disc; NVE neovascularization elsewhere; AREDS age related eye disease study; ARMD age related macular degeneration; POAG primary open angle glaucoma; EBMD epithelial/anterior basement membrane dystrophy; ACIOL anterior chamber intraocular lens; IOL intraocular lens; PCIOL posterior chamber intraocular lens; Phaco/IOL phacoemulsification with intraocular lens placement; [place] photorefractive keratectomy; LASIK laser assisted in situ keratomileusis; HTN hypertension; DM diabetes mellitus; COPD chronic obstructive pulmonary disease

## 2017-03-17 ENCOUNTER — Encounter (INDEPENDENT_AMBULATORY_CARE_PROVIDER_SITE_OTHER): Payer: Self-pay | Admitting: Ophthalmology

## 2017-03-17 ENCOUNTER — Ambulatory Visit (INDEPENDENT_AMBULATORY_CARE_PROVIDER_SITE_OTHER): Payer: Medicare Other | Admitting: Ophthalmology

## 2017-03-17 DIAGNOSIS — Z961 Presence of intraocular lens: Secondary | ICD-10-CM

## 2017-03-17 DIAGNOSIS — H3522 Other non-diabetic proliferative retinopathy, left eye: Secondary | ICD-10-CM

## 2017-03-17 DIAGNOSIS — H3322 Serous retinal detachment, left eye: Secondary | ICD-10-CM

## 2017-03-17 NOTE — H&P (Signed)
Jerome Mays is an 76 y.o. male.   Chief Complaint: Decreased vision left eye. 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 Presents with redetachment of retina OS from PVR.  Past Medical History:  Diagnosis Date  . Arthritis   . Colonic polyp   . COPD (chronic obstructive pulmonary disease) ([place])   . GERD (gastroesophageal reflux disease)   . Gout   . Malignant tumor of kidney ([place])   . Pain    LOWER BACK AND LEFT HIP - HX OF PREVIOUS LUMBAR AND CERVICAL SURGERY--PT STATES HE HAS HAD NUMBNESS IN BOTH FEET SINCE HIS BACK SURGERY  . Renal calculus   . Shortness of breath     Past Surgical History:  Procedure Laterality Date  . BACK SURGERY     LUMBAR SURGERY  . CATARACT EXTRACTION Bilateral 2003   Surgeon unknown  . EYE SURGERY     BILATERAL CATARACT EXTRACTIONS  . GIVENS CAPSULE STUDY N/A 10/07/2015   Procedure: GIVENS CAPSULE STUDY;  Surgeon: Carol Ada, MD;  Location: [facility];  Service: Endoscopy;  Laterality: N/A;  . HAND SURGERY     left  . KNEE SURGERY     right  . LAMINECTOMY     cervical  . NEPHRECTOMY     PT STATES RIGHT KIDNEY WAS REMOVED  . 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 TOTAL KNEE ARTHROPLASTY Left 10/19/2012   Procedure: LEFT TOTAL KNEE ARTHROPLASTY;  Surgeon: Magnus Sinning, MD;  Location: [facility];  Service: Orthopedics;  Laterality: Left;  [name] VITRECTOMY 25 GAUGE WITH SCLERAL BUCKLE Left 02/17/2017   Procedure: VITRECTOMY 25 GAUGE WITH SCLERAL BUCKLE membrane peel, injection of silicone oil, and endolaser photocoagulation.;  Surgeon: [name], MD;  Location: [facility];  Service: Ophthalmology;  Laterality: Left;    Family History  Problem Relation Age of Onset  . Diabetes Mother   . COPD Brother   . COPD Sister   . Amblyopia Neg Hx   . Blindness Neg Hx   . Cataracts Neg Hx   . Glaucoma Neg Hx   . Macular degeneration Neg Hx   . Retinal detachment Neg Hx   . Retinitis pigmentosa Neg Hx    Social History:   reports that he quit smoking about 21 years ago. His smoking use included Cigarettes and Pipe. He has a 80.00 pack-year smoking history. He has never used smokeless tobacco. He reports that he does not drink alcohol or use drugs.  Allergies:  Allergies  Allergen Reactions  . Lipitor [Atorvastatin] Rash and Other (See Comments)    Passed out    No prescriptions prior to admission.    Review of systems otherwise negative  There were no vitals taken for this visit.  Physical exam: Mental status: oriented x3. Eyes: See eye exam associated with this date of surgery in media tab.  Scanned in by scanning center Ears, Nose, Throat: within normal limits Neck: Within Normal limits General: within normal limits Chest: Within normal limits Breast: deferred Heart: Within normal limits Abdomen: Within normal limits GU: deferred Extremities: within normal limits Skin: within normal limits  Assessment/Plan 1. Tractional/PVR retinal detachment, LEFT EYE  Following SBP +25g PPV/PFC/EL/FAX/C3F8 OS on 9.12.18 Plan: To [facility] for 25g PPV/MP/SOX OS under general anesthesia  Case scheduled for Monday, 10.15.18  [name] 03/17/2017, 9:09 AM

## 2017-03-18 ENCOUNTER — Encounter (HOSPITAL_COMMUNITY): Payer: Self-pay

## 2017-03-18 NOTE — Progress Notes (Signed)
Spoke to patients wife Gayle Collard and gave instructions for surgery on Monday. Pt told to take advair, allupurinol and daliresp the morning of. Pt told to discuss with Dr. Coralyn Pear about taking eye drop medications morning of surgery.

## 2017-03-22 ENCOUNTER — Encounter (HOSPITAL_COMMUNITY): Payer: Self-pay | Admitting: *Deleted

## 2017-03-22 ENCOUNTER — Ambulatory Visit (HOSPITAL_COMMUNITY): Payer: Medicare Other | Admitting: Certified Registered"

## 2017-03-22 ENCOUNTER — Ambulatory Visit (HOSPITAL_COMMUNITY)
Admission: RE | Admit: 2017-03-22 | Discharge: 2017-03-22 | Disposition: A | Discharge status: Home / Self Care | Payer: Medicare Other | Source: Ambulatory Visit | Attending: Ophthalmology | Admitting: Ophthalmology

## 2017-03-22 ENCOUNTER — Ambulatory Visit (HOSPITAL_COMMUNITY)
Admission: RE | Disposition: A | Discharge status: Home / Self Care | Payer: Self-pay | Source: Ambulatory Visit | Attending: Ophthalmology

## 2017-03-22 DIAGNOSIS — Z85528 Personal history of other malignant neoplasm of kidney: Secondary | ICD-10-CM | POA: Insufficient documentation

## 2017-03-22 DIAGNOSIS — Z96652 Presence of left artificial knee joint: Secondary | ICD-10-CM | POA: Diagnosis not present

## 2017-03-22 DIAGNOSIS — H5462 Unqualified visual loss, left eye, normal vision right eye: Secondary | ICD-10-CM | POA: Diagnosis present

## 2017-03-22 DIAGNOSIS — J449 Chronic obstructive pulmonary disease, unspecified: Secondary | ICD-10-CM | POA: Diagnosis not present

## 2017-03-22 DIAGNOSIS — Z905 Acquired absence of kidney: Secondary | ICD-10-CM | POA: Insufficient documentation

## 2017-03-22 DIAGNOSIS — H3342 Traction detachment of retina, left eye: Secondary | ICD-10-CM | POA: Insufficient documentation

## 2017-03-22 DIAGNOSIS — H43312 Vitreous membranes and strands, left eye: Secondary | ICD-10-CM | POA: Diagnosis not present

## 2017-03-22 DIAGNOSIS — Z87891 Personal history of nicotine dependence: Secondary | ICD-10-CM | POA: Diagnosis not present

## 2017-03-22 HISTORY — DX: Anemia, unspecified: D64.9

## 2017-03-22 HISTORY — PX: MEMBRANE PEEL: SHX5967

## 2017-03-22 HISTORY — PX: PARS PLANA VITRECTOMY: SHX2166

## 2017-03-22 HISTORY — DX: Personal history of urinary calculi: Z87.442

## 2017-03-22 HISTORY — DX: Constipation, unspecified: K59.00

## 2017-03-22 LAB — CBC
HCT: 41 % (ref 39.0–52.0)
HEMOGLOBIN: 13.2 g/dL (ref 13.0–17.0)
MCH: 30.4 pg (ref 26.0–34.0)
MCHC: 32.2 g/dL (ref 30.0–36.0)
MCV: 94.5 fL (ref 78.0–100.0)
Platelets: 146 10*3/uL — ABNORMAL LOW (ref 150–400)
RBC: 4.34 MIL/uL (ref 4.22–5.81)
RDW: 13.8 % (ref 11.5–15.5)
WBC: 6.4 10*3/uL (ref 4.0–10.5)

## 2017-03-22 LAB — BASIC METABOLIC PANEL
ANION GAP: 9 (ref 5–15)
BUN: 14 mg/dL (ref 6–20)
CHLORIDE: 109 mmol/L (ref 101–111)
CO2: 21 mmol/L — AB (ref 22–32)
Calcium: 9.1 mg/dL (ref 8.9–10.3)
Creatinine, Ser: 0.95 mg/dL (ref 0.61–1.24)
GFR calc non Af Amer: >60 mL/min (ref >60)
Glucose, Bld: 98 mg/dL (ref 65–99)
POTASSIUM: 3.7 mmol/L (ref 3.5–5.1)
SODIUM: 139 mmol/L (ref 135–145)

## 2017-03-22 SURGERY — PARS PLANA VITRECTOMY WITH 25 GAUGE
Anesthesia: General | Laterality: Left

## 2017-03-22 MED ORDER — LIDOCAINE HCL (PF) 4 % IJ SOLN
INTRAMUSCULAR | Status: AC
  Filled 2017-03-22: qty 5

## 2017-03-22 MED ORDER — STERILE WATER FOR INJECTION IJ SOLN
INTRAMUSCULAR | Status: AC
  Filled 2017-03-22: qty 10

## 2017-03-22 MED ORDER — LIDOCAINE 2% (20 MG/ML) 5 ML SYRINGE
INTRAMUSCULAR | Status: DC | PRN
  Administered 2017-03-22: 100 mg via INTRAVENOUS

## 2017-03-22 MED ORDER — ONDANSETRON HCL 4 MG/2ML IJ SOLN
INTRAMUSCULAR | Status: DC | PRN
  Administered 2017-03-22 (×2): 4 mg via INTRAVENOUS

## 2017-03-22 MED ORDER — SODIUM CHLORIDE 0.9 % IJ SOLN
INTRAMUSCULAR | Status: AC
  Filled 2017-03-22: qty 10

## 2017-03-22 MED ORDER — PHENYLEPHRINE HCL 10 % OP SOLN
1.0000 [drp] | OPHTHALMIC | Status: AC | PRN
  Administered 2017-03-22 (×3): 1 [drp] via OPHTHALMIC
  Filled 2017-03-22: qty 5

## 2017-03-22 MED ORDER — LIDOCAINE 2% (20 MG/ML) 5 ML SYRINGE
INTRAMUSCULAR | Status: AC
  Filled 2017-03-22: qty 5

## 2017-03-22 MED ORDER — 0.9 % SODIUM CHLORIDE (POUR BTL) OPTIME
TOPICAL | Status: DC | PRN
  Administered 2017-03-22: 1000 mL

## 2017-03-22 MED ORDER — NA CHONDROIT SULF-NA HYALURON 40-30 MG/ML IO SOLN
INTRAOCULAR | Status: AC
  Filled 2017-03-22: qty 0.5

## 2017-03-22 MED ORDER — PROPOFOL 10 MG/ML IV BOLUS
INTRAVENOUS | Status: DC | PRN
  Administered 2017-03-22: 150 mg via INTRAVENOUS
  Administered 2017-03-22: 20 mg via INTRAVENOUS
  Administered 2017-03-22: 30 mg via INTRAVENOUS
  Administered 2017-03-22: 20 mg via INTRAVENOUS
  Administered 2017-03-22: 30 mg via INTRAVENOUS
  Administered 2017-03-22: 20 mg via INTRAVENOUS

## 2017-03-22 MED ORDER — METOCLOPRAMIDE HCL 5 MG/ML IJ SOLN: 10.0000 mg | Freq: Once | INTRAMUSCULAR | Status: DC | PRN

## 2017-03-22 MED ORDER — PROPOFOL 10 MG/ML IV BOLUS
INTRAVENOUS | Status: AC
  Filled 2017-03-22: qty 20

## 2017-03-22 MED ORDER — BACITRACIN-POLYMYXIN B 500-10000 UNIT/GM OP OINT
TOPICAL_OINTMENT | OPHTHALMIC | Status: AC
  Filled 2017-03-22: qty 3.5

## 2017-03-22 MED ORDER — BSS PLUS IO SOLN
INTRAOCULAR | Status: AC
  Filled 2017-03-22: qty 500

## 2017-03-22 MED ORDER — EPINEPHRINE PF 1 MG/ML IJ SOLN
INTRAMUSCULAR | Status: AC
  Filled 2017-03-22: qty 1

## 2017-03-22 MED ORDER — TRIAMCINOLONE ACETONIDE 40 MG/ML IJ SUSP
INTRAMUSCULAR | Status: AC
  Filled 2017-03-22: qty 5

## 2017-03-22 MED ORDER — ROCURONIUM BROMIDE 50 MG/5ML IV SOLN
INTRAVENOUS | Status: AC
  Filled 2017-03-22: qty 1

## 2017-03-22 MED ORDER — DEXAMETHASONE SODIUM PHOSPHATE 10 MG/ML IJ SOLN
INTRAMUSCULAR | Status: DC | PRN
  Administered 2017-03-22: 5 mg via INTRAVENOUS

## 2017-03-22 MED ORDER — GLYCOPYRROLATE 0.2 MG/ML IV SOSY
PREFILLED_SYRINGE | INTRAVENOUS | Status: DC | PRN
  Administered 2017-03-22: .1 mg via INTRAVENOUS

## 2017-03-22 MED ORDER — TRIAMCINOLONE ACETONIDE 40 MG/ML IJ SUSP
INTRAMUSCULAR | Status: DC | PRN
  Administered 2017-03-22: 40 mg via INTRAMUSCULAR

## 2017-03-22 MED ORDER — LIDOCAINE HCL (PF) 2 % IJ SOLN
INTRAMUSCULAR | Status: AC
  Filled 2017-03-22: qty 10

## 2017-03-22 MED ORDER — PREDNISOLONE ACETATE 1 % OP SUSP
OPHTHALMIC | Status: DC | PRN
  Administered 2017-03-22: 1 [drp]

## 2017-03-22 MED ORDER — DEXAMETHASONE SODIUM PHOSPHATE 10 MG/ML IJ SOLN
INTRAMUSCULAR | Status: DC | PRN
  Administered 2017-03-22: 10 mg via INTRAVENOUS

## 2017-03-22 MED ORDER — POLYMYXIN B SULFATE 500000 UNITS IJ SOLR
INTRAMUSCULAR | Status: AC
  Filled 2017-03-22: qty 500000

## 2017-03-22 MED ORDER — SUGAMMADEX SODIUM 200 MG/2ML IV SOLN
INTRAVENOUS | Status: DC | PRN
  Administered 2017-03-22: 160 mg via INTRAVENOUS

## 2017-03-22 MED ORDER — EPINEPHRINE PF 1 MG/ML IJ SOLN
INTRAMUSCULAR | Status: DC | PRN
Start: 2017-03-22 — End: 2017-03-22
  Administered 2017-03-22: .3 mL

## 2017-03-22 MED ORDER — PHENYLEPHRINE 40 MCG/ML (10ML) SYRINGE FOR IV PUSH (FOR BLOOD PRESSURE SUPPORT)
PREFILLED_SYRINGE | INTRAVENOUS | Status: AC
  Filled 2017-03-22: qty 20

## 2017-03-22 MED ORDER — DEXAMETHASONE SODIUM PHOSPHATE 10 MG/ML IJ SOLN
INTRAMUSCULAR | Status: AC
  Filled 2017-03-22: qty 1

## 2017-03-22 MED ORDER — PREDNISOLONE ACETATE 1 % OP SUSP
OPHTHALMIC | Status: AC
  Filled 2017-03-22: qty 5

## 2017-03-22 MED ORDER — CARBACHOL 0.01 % IO SOLN
INTRAOCULAR | Status: AC
  Filled 2017-03-22: qty 1.5

## 2017-03-22 MED ORDER — PROPARACAINE HCL 0.5 % OP SOLN
1.0000 [drp] | OPHTHALMIC | Status: AC | PRN
  Administered 2017-03-22 (×3): 1 [drp] via OPHTHALMIC
  Filled 2017-03-22: qty 15

## 2017-03-22 MED ORDER — ONDANSETRON HCL 4 MG/2ML IJ SOLN
INTRAMUSCULAR | Status: AC
  Filled 2017-03-22: qty 2

## 2017-03-22 MED ORDER — DORZOLAMIDE HCL-TIMOLOL MAL 2-0.5 % OP SOLN
OPHTHALMIC | Status: AC
  Filled 2017-03-22: qty 10

## 2017-03-22 MED ORDER — GATIFLOXACIN 0.5 % OP SOLN
1.0000 [drp] | OPHTHALMIC | Status: AC | PRN
  Administered 2017-03-22 (×3): 1 [drp] via OPHTHALMIC
  Filled 2017-03-22: qty 2.5

## 2017-03-22 MED ORDER — LACTATED RINGERS IV SOLN
INTRAVENOUS | Status: DC | PRN
  Administered 2017-03-22 (×2): via INTRAVENOUS

## 2017-03-22 MED ORDER — MEPERIDINE HCL 25 MG/ML IJ SOLN: 6.2500 mg | INTRAMUSCULAR | Status: DC | PRN

## 2017-03-22 MED ORDER — FENTANYL CITRATE (PF) 250 MCG/5ML IJ SOLN
INTRAMUSCULAR | Status: AC
  Filled 2017-03-22: qty 5

## 2017-03-22 MED ORDER — DORZOLAMIDE HCL-TIMOLOL MAL 2-0.5 % OP SOLN
OPHTHALMIC | Status: DC | PRN
  Administered 2017-03-22: 1 [drp]

## 2017-03-22 MED ORDER — NA CHONDROIT SULF-NA HYALURON 40-30 MG/ML IO SOLN
INTRAOCULAR | Status: DC | PRN
Start: 2017-03-22 — End: 2017-03-22
  Administered 2017-03-22 (×2): 0.5 mL via INTRAOCULAR

## 2017-03-22 MED ORDER — PHENYLEPHRINE HCL 10 MG/ML IJ SOLN
INTRAVENOUS | Status: DC | PRN
  Administered 2017-03-22: 11:00:00 via INTRAVENOUS
  Administered 2017-03-22: 25 ug/min via INTRAVENOUS

## 2017-03-22 MED ORDER — CYCLOPENTOLATE HCL 1 % OP SOLN
1.0000 [drp] | OPHTHALMIC | Status: AC | PRN
  Administered 2017-03-22 (×4): 1 [drp] via OPHTHALMIC
  Filled 2017-03-22: qty 2

## 2017-03-22 MED ORDER — FENTANYL CITRATE (PF) 100 MCG/2ML IJ SOLN
INTRAMUSCULAR | Status: DC | PRN
  Administered 2017-03-22: 50 ug via INTRAVENOUS

## 2017-03-22 MED ORDER — TOBRAMYCIN-DEXAMETHASONE 0.3-0.1 % OP OINT
TOPICAL_OINTMENT | OPHTHALMIC | Status: AC
  Filled 2017-03-22: qty 3.5

## 2017-03-22 MED ORDER — BSS IO SOLN
INTRAOCULAR | Status: AC
  Filled 2017-03-22: qty 15

## 2017-03-22 MED ORDER — PHENYLEPHRINE HCL 10 MG/ML IJ SOLN
INTRAMUSCULAR | Status: AC
  Filled 2017-03-22: qty 1

## 2017-03-22 MED ORDER — FENTANYL CITRATE (PF) 100 MCG/2ML IJ SOLN
INTRAMUSCULAR | Status: AC
  Administered 2017-03-22: 50 ug via INTRAVENOUS
  Filled 2017-03-22: qty 2

## 2017-03-22 MED ORDER — SUGAMMADEX SODIUM 200 MG/2ML IV SOLN
INTRAVENOUS | Status: AC
  Filled 2017-03-22: qty 2

## 2017-03-22 MED ORDER — BSS PLUS IO SOLN
INTRAOCULAR | Status: DC | PRN
  Administered 2017-03-22: 500 via INTRAOCULAR

## 2017-03-22 MED ORDER — ROCURONIUM BROMIDE 10 MG/ML (PF) SYRINGE
PREFILLED_SYRINGE | INTRAVENOUS | Status: DC | PRN
  Administered 2017-03-22: 20 mg via INTRAVENOUS
  Administered 2017-03-22 (×4): 10 mg via INTRAVENOUS
  Administered 2017-03-22: 50 mg via INTRAVENOUS

## 2017-03-22 MED ORDER — STERILE WATER FOR INJECTION IJ SOLN
INTRAMUSCULAR | Status: DC | PRN
  Administered 2017-03-22: 1 mL

## 2017-03-22 MED ORDER — GATIFLOXACIN 0.5 % OP SOLN
OPHTHALMIC | Status: AC
  Filled 2017-03-22: qty 2.5

## 2017-03-22 MED ORDER — TROPICAMIDE 1 % OP SOLN
1.0000 [drp] | OPHTHALMIC | Status: AC | PRN
  Administered 2017-03-22 (×3): 1 [drp] via OPHTHALMIC
  Filled 2017-03-22: qty 15

## 2017-03-22 MED ORDER — CEFTAZIDIME 1 G IJ SOLR
INTRAMUSCULAR | Status: AC
  Filled 2017-03-22: qty 1

## 2017-03-22 MED ORDER — ATROPINE SULFATE 1 % OP SOLN
OPHTHALMIC | Status: AC
  Filled 2017-03-22: qty 5

## 2017-03-22 MED ORDER — BRIMONIDINE TARTRATE 0.15 % OP SOLN
OPHTHALMIC | Status: AC
  Filled 2017-03-22: qty 5

## 2017-03-22 MED ORDER — BUPIVACAINE HCL (PF) 0.75 % IJ SOLN
INTRAMUSCULAR | Status: AC
  Filled 2017-03-22: qty 30

## 2017-03-22 MED ORDER — FENTANYL CITRATE (PF) 100 MCG/2ML IJ SOLN
25.0000 ug | INTRAMUSCULAR | Status: DC | PRN
  Administered 2017-03-22 (×2): 50 ug via INTRAVENOUS

## 2017-03-22 MED ORDER — BRIMONIDINE TARTRATE 0.15 % OP SOLN
OPHTHALMIC | Status: DC | PRN
  Administered 2017-03-22: 1 [drp]

## 2017-03-22 SURGICAL SUPPLY — 66 items
APPLICATOR COTTON TIP 6IN STRL (MISCELLANEOUS) ×12 IMPLANT
APPLICATOR DR MATTHEWS STRL (MISCELLANEOUS) ×16 IMPLANT
BANDAGE EYE OVAL (MISCELLANEOUS) ×2 IMPLANT
BLADE EYE CATARACT 19 1.4 BEAV (BLADE) IMPLANT
BLADE MVR KNIFE 19G (BLADE) ×2 IMPLANT
CANNULA ANT CHAM MAIN (OPHTHALMIC RELATED) IMPLANT
CANNULA FLEX TIP 25G (CANNULA) ×2 IMPLANT
CANNULA TROCAR 23 GA VLV (OPHTHALMIC) IMPLANT
CANNULA VLV SOFT TIP 25GA (OPHTHALMIC) ×4 IMPLANT
CLSR STERI-STRIP ANTIMIC 1/2X4 (GAUZE/BANDAGES/DRESSINGS) ×2 IMPLANT
CORD BIPOLAR FORCEPS 12FT (ELECTRODE) ×2 IMPLANT
COTTONBALL LRG STERILE PKG (GAUZE/BANDAGES/DRESSINGS) ×6 IMPLANT
DRAPE MICROSCOPE LEICA 46X105 (MISCELLANEOUS) ×2 IMPLANT
DRAPE OPHTHALMIC 77X100 STRL (CUSTOM PROCEDURE TRAY) ×2 IMPLANT
ERASER HMR WETFIELD 23G BP (MISCELLANEOUS) IMPLANT
FILTER BLUE MILLIPORE (MISCELLANEOUS) IMPLANT
FILTER STRAW FLUID ASPIR (MISCELLANEOUS) IMPLANT
FORCEPS GRIESHABER ILM 25G A (INSTRUMENTS) ×2 IMPLANT
FORCEPS GRIESHABER MAX 25G (MISCELLANEOUS) ×2 IMPLANT
GAS AUTO FILL CONSTEL (OPHTHALMIC)
GAS AUTO FILL CONSTELLATION (OPHTHALMIC) IMPLANT
GLOVE BIO SURGEON STRL SZ7.5 (GLOVE) ×2 IMPLANT
GLOVE BIOGEL M 7.0 STRL (GLOVE) ×2 IMPLANT
GLOVE SKINSENSE NS SZ7.5 (GLOVE) ×1
GLOVE SKINSENSE STRL SZ7.5 (GLOVE) ×1 IMPLANT
GOWN STRL REUS W/ TWL LRG LVL3 (GOWN DISPOSABLE) ×2 IMPLANT
GOWN STRL REUS W/ TWL XL LVL3 (GOWN DISPOSABLE) ×1 IMPLANT
GOWN STRL REUS W/TWL LRG LVL3 (GOWN DISPOSABLE) ×2
GOWN STRL REUS W/TWL XL LVL3 (GOWN DISPOSABLE) ×1
KIT BASIN OR (CUSTOM PROCEDURE TRAY) ×2 IMPLANT
KIT PERFLUORON PROCEDURE 5ML (MISCELLANEOUS) ×2 IMPLANT
LENS BIOM SUPER VIEW SET DISP (OPHTHALMIC RELATED) IMPLANT
MICROPICK 25G (MISCELLANEOUS)
NEEDLE 18GX1X1/2 (RX/OR ONLY) (NEEDLE) ×2 IMPLANT
NEEDLE 25GX 5/8IN NON SAFETY (NEEDLE) ×10 IMPLANT
NEEDLE HYPO 30X.5 LL (NEEDLE) ×4 IMPLANT
NEEDLE PRECISIONGLIDE 27X1.5 (NEEDLE) IMPLANT
NS IRRIG 1000ML POUR BTL (IV SOLUTION) ×2 IMPLANT
OIL SILICONE OPHTHALMIC 1000 (Ophthalmic Related) ×2 IMPLANT
PACK VITRECTOMY CUSTOM (CUSTOM PROCEDURE TRAY) ×2 IMPLANT
PAD ARMBOARD 7.5X6 YLW CONV (MISCELLANEOUS) ×4 IMPLANT
PAK PIK VITRECTOMY CVS 25GA (OPHTHALMIC) ×2 IMPLANT
PENCIL BIPOLAR 25GA STR DISP (OPHTHALMIC RELATED) ×2 IMPLANT
PIC ILLUMINATED 25G (OPHTHALMIC) ×2
PICK MICROPICK 25G (MISCELLANEOUS) IMPLANT
PIK ILLUMINATED 25G (OPHTHALMIC) ×1 IMPLANT
PROBE DIATHERMY DSP 27GA (MISCELLANEOUS) IMPLANT
PROBE ENDO DIATHERMY 25G (MISCELLANEOUS) ×2 IMPLANT
PROBE LASER ILLUM FLEX CVD 25G (OPHTHALMIC) IMPLANT
REPL STRA BRUSH NEEDLE (NEEDLE) IMPLANT
RESERVOIR BACK FLUSH (MISCELLANEOUS) IMPLANT
RETRACTOR IRIS FLEX 25G GRIESH (INSTRUMENTS) IMPLANT
ROLLS DENTAL (MISCELLANEOUS) ×4 IMPLANT
SET INJECTOR OIL FLUID CONSTEL (OPHTHALMIC) ×4 IMPLANT
SPEAR EYE SURG WECK-CEL (MISCELLANEOUS) ×4 IMPLANT
SPONGE SURGIFOAM ABS GEL 12-7 (HEMOSTASIS) IMPLANT
STOPCOCK 4 WAY LG BORE MALE ST (IV SETS) IMPLANT
SUT VICRYL 7 0 TG140 8 (SUTURE) ×2 IMPLANT
SYR 10ML LL (SYRINGE) ×2 IMPLANT
SYR 20CC LL (SYRINGE) ×2 IMPLANT
SYR 5ML LL (SYRINGE) ×2 IMPLANT
SYR BULB 3OZ (MISCELLANEOUS) ×2 IMPLANT
SYR TB 1ML LUER SLIP (SYRINGE) IMPLANT
TOWEL OR 17X26 4PK STRL BLUE (TOWEL DISPOSABLE) IMPLANT
TUBING HIGH PRESS EXTEN 6IN (TUBING) ×2 IMPLANT
WATER STERILE IRR 1000ML POUR (IV SOLUTION) ×2 IMPLANT

## 2017-03-22 NOTE — Progress Notes (Signed)
[facility] Note  03/23/2017     CHIEF COMPLAINT Patient presents for Post-op Follow-up   HISTORY OF PRESENT ILLNESS: Jerome Mays is a 76 y.o. male who presents to the clinic today for:   HPI    Post-op Follow-up  In left eye.  I, the attending physician,  performed the HPI with the patient and updated documentation appropriately.        Comments  POV1; 1 day S/P PPV w/ membrane peel/relaxing retinectomy/PFC/SOX OS under GA (10.15.2018); S/P SBP (42 band) + 25g PPV/PFC/EL/FAX/C3F8 OS (09.12.2018);  Pt states he kept a headache all night; Pt denies nausea; Pt came in with patch over OS;      Last edited by [name], MD on 03/23/2017  8:15 AM. (History)      Referring physician: [name], MD [place], [place] [address]  HISTORICAL INFORMATION:   Selected notes from the MEDICAL RECORD NUMBER Initial referral from Dr. [name] for possible RD OS    CURRENT MEDICATIONS: Current Outpatient Prescriptions (Ophthalmic Drugs)  Medication Sig  . brimonidine (ALPHAGAN) 0.2 % ophthalmic solution Place 1 drop into the left eye 2 (two) times daily.  . carboxymethylcellulose (REFRESH) 1 % ophthalmic solution Apply 1 drop to eye 3 (three) times daily.  . dorzolamide-timolol (COSOPT) 22.3-6.8 MG/ML ophthalmic solution Place 1 drop into the left eye 2 (two) times daily.  [name] gatifloxacin (ZYMAXID) 0.5 % SOLN Place 1 drop into both eyes 4 (four) times daily.  Vladimir Faster Glycol-Propyl Glycol (SYSTANE OP) Place 1 drop into both eyes as needed (for dry eyes).  . prednisoLONE acetate (PRED FORTE) 1 % ophthalmic suspension Place 1 drop into the left eye 6 (six) times daily.   No current facility-administered medications for this visit.  (Ophthalmic Drugs)   Current Outpatient Prescriptions (Other)  Medication Sig  . ADVAIR DISKUS 250-50 MCG/DOSE AEPB INHALE 1 PUFF INTO LUNGS TWICE DAILY  . allopurinol (ZYLOPRIM) 300 MG tablet Take 300  mg by mouth daily.   [name] aspirin EC 81 MG tablet Take 81 mg by mouth daily.  [name] DALIRESP 500 MCG TABS tablet TAKE 1 TABLET BY MOUTH ONCE DAILY (Patient taking differently: TAKE 500 MCG BY MOUTH ONCE DAILY)  . diphenhydramine-acetaminophen (TYLENOL PM) 25-500 MG TABS tablet Take 2 tablets by mouth at bedtime.  [name] HYDROcodone-acetaminophen (NORCO/VICODIN) 5-325 MG tablet Take 1 tablet by mouth every 4 (four) hours as needed for moderate pain.  . Multiple Vitamin (ONE-A-DAY MENS PO) Take 1 tablet by mouth daily.  . predniSONE (DELTASONE) 20 MG tablet Take 4 tablets (80 mg total) by mouth daily.   No current facility-administered medications for this visit.  (Other)      REVIEW OF SYSTEMS: ROS    Positive for: Musculoskeletal, Eyes   Negative for: Constitutional, Gastrointestinal, Neurological, Skin, Genitourinary, HENT, Endocrine, Cardiovascular, Respiratory, Psychiatric, Allergic/Imm, Heme/Lymph   Last edited by Alyse Low on 03/23/2017  7:58 AM. (History)       ALLERGIES Allergies  Allergen Reactions  . Lipitor [Atorvastatin] Rash and Other (See Comments)    Passed out    PAST MEDICAL HISTORY Past Medical History:  Diagnosis Date  . Anemia    low iron  . Arthritis   . Colonic polyp   . Constipation   . COPD (chronic obstructive pulmonary disease) ([place])   . GERD (gastroesophageal reflux disease)   . Gout   . History of kidney stones   . Malignant tumor of kidney ([place])   .  Pain    LOWER BACK AND LEFT HIP - HX OF PREVIOUS LUMBAR AND CERVICAL SURGERY--PT STATES HE HAS HAD NUMBNESS IN BOTH FEET SINCE HIS BACK SURGERY  . Renal calculus   . Shortness of breath    Past Surgical History:  Procedure Laterality Date  . BACK SURGERY     LUMBAR SURGERY  . CATARACT EXTRACTION Bilateral 2003   Surgeon unknown  . EYE SURGERY     BILATERAL CATARACT EXTRACTIONS  . GIVENS CAPSULE STUDY N/A 10/07/2015   Procedure: GIVENS CAPSULE STUDY;  Surgeon: Carol Ada, MD;  Location: [facility];  Service: Endoscopy;  Laterality: N/A;  . HAND SURGERY     left  . KNEE SURGERY     right  . LAMINECTOMY     cervical  . NEPHRECTOMY     PT STATES RIGHT KIDNEY WAS REMOVED  . 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 TOTAL KNEE ARTHROPLASTY Left 10/19/2012   Procedure: LEFT TOTAL KNEE ARTHROPLASTY;  Surgeon: Magnus Sinning, MD;  [facility];  Service: Orthopedics;  Laterality: Left;  [name] VITRECTOMY 25 GAUGE WITH SCLERAL BUCKLE Left 02/17/2017   Procedure: VITRECTOMY 25 GAUGE WITH SCLERAL BUCKLE membrane peel, injection of silicone oil, and endolaser photocoagulation.;  Surgeon: [name], MD;  Location: [facility];  Service: Ophthalmology;  Laterality: Left;    FAMILY HISTORY Family History  Problem Relation Age of Onset  . Diabetes Mother   . COPD Brother   . COPD Sister   . Amblyopia Neg Hx   . Blindness Neg Hx   . Cataracts Neg Hx   . Glaucoma Neg Hx   . Macular degeneration Neg Hx   . Retinal detachment Neg Hx   . Retinitis pigmentosa Neg Hx     SOCIAL HISTORY Social History  Substance Use Topics  . Smoking status: Former Smoker    Packs/day: 2.00    Years: 40.00    Types: Cigarettes, Pipe    Quit date: 06/09/1995  . Smokeless tobacco: Never Used  . Alcohol use No         OPHTHALMIC EXAM:  Base Eye Exam    Visual Acuity (Snellen - Linear)      Right Left   Dist [name]  HM   Dist ph [name]  NI       Tonometry (Tonopen, 8:08 AM)      Right Left   Pressure  31       Pupils      Dark Shape React APD   Right       Left 5 Round NR None       Neuro/Psych    Oriented x3:  Yes   Mood/Affect:  Normal       Dilation    Left eye:  1.0% Mydriacyl, 2.5% Phenylephrine @ 8:08 AM        Slit Lamp and Fundus Exam    External Exam      Right Left   External Normal Periorbital edema improving       Slit Lamp Exam      Right Left   Lids/Lashes Dermatochalasis - upper lid Dermatochalasis - upper lid   Conjunctiva/Sclera White and quiet Sutures intact;  Chemosis - temporally   Cornea Clear 1+ K edema with descemet folds; 2+ PEE; no epi defect   Anterior Chamber Deep and quiet Deep; 0.5+ cell/pigment   Iris Round and dilated Round and dilated - dilation 82m   Lens Three piece Posterior chamber intraocular lens  Three piece Posterior chamber intraocular lens; condensation on posterior surface of lens -- ?PFO   Vitreous Vitreous syneresis Post vitrectomy with 63% silicone oil fill;        Fundus Exam      Right Left   Disc  Normal   C/D Ratio  0.45   Macula  inf detachment; extensive PVR along inferior temporal arcades   Vessels  Vascular attenuation - mild   Periphery  moderate buckle height; retina attached over buckle with inferior retinal tears on buckle; laser around inferior breaks and on buckle;          IMAGING AND PROCEDURES  Imaging and Procedures for 03/23/17           ASSESSMENT/PLAN:    ICD-10-CM   1. 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 - inferior detachment from 2-10 oclock with HST at ~530 - fovea off, but sup nasal macula attached - by history RD likely started 4-5 wks prior to initial presentation, but fovea became involved ~Saturday, 02/13/17 - s/p SBP (42 band) + 25g PPV/PFC/EL/FAX/C3F8 OS 9.12.18 - extensive PVR along inf temp arcades with tractional redetachment inf temp noted last visit, POM1 - s/p PPV w/ membrane peel/relaxing retinectomy/PFC/SOX OS under GA, 10.15.18             - doing well this morning             - retina attached and in good position             - IOP elevatedtoday             - start   PF 6x/day OS                         zymaxid QID OS                         Atropine BID OS                         Alphagan P BID OS                         Cosopt BID OS                         PSO ung QID OS  - start oral prednisone -- 80 mg  x 7 days, then 40 mg x 7 days, then 20 mg x 7 days             - cont face down positioning until Friday; avoid laying flat on back             - eye shield when sleeping             - post op drop and positioning instructions reviewed             - tylenol/ibuprofen for pain             - f/u Friday for POV / IOP check, and will f/u weekly for first month  3. Pseudophakia OU - IOLs in good position OU - monitor  Ophthalmic Meds Ordered this visit:  Meds ordered this encounter  Medications  .  predniSONE (DELTASONE) 20 MG tablet    Sig: Take 4 tablets (80 mg total) by mouth daily.    Dispense:  120 tablet    Refill:  1       No Follow-up on file.  There are no Patient Instructions on file for this visit.   Explained the diagnoses, plan, and follow up with the patient and they expressed understanding.  Patient expressed understanding of the importance of proper follow up care.   Gardiner Sleeper, M.D., Ph.D. Diseases & Surgery of the Retina and Vitreous [facility] 03/23/17      Abbreviations: M myopia (nearsighted); A astigmatism; H hyperopia (farsighted); P presbyopia; Mrx spectacle prescription;  CTL contact lenses; OD right eye; OS left eye; OU both eyes  XT exotropia; ET esotropia; PEK punctate epithelial keratitis; PEE punctate epithelial erosions; DES dry eye syndrome; MGD meibomian gland dysfunction; ATs artificial tears; PFAT's preservative free artificial tears; [place] nuclear sclerotic cataract; PSC posterior subcapsular cataract; ERM epi-retinal membrane; PVD posterior vitreous detachment; RD retinal detachment; DM diabetes mellitus; DR diabetic retinopathy; NPDR non-proliferative diabetic retinopathy; PDR proliferative diabetic retinopathy; CSME clinically significant macular edema; DME diabetic macular edema; dbh dot blot hemorrhages; CWS cotton wool spot; POAG primary open angle glaucoma; C/D cup-to-disc ratio; HVF humphrey visual field; GVF  goldmann visual field; OCT optical coherence tomography; IOP intraocular pressure; BRVO Branch retinal vein occlusion; CRVO central retinal vein occlusion; CRAO central retinal artery occlusion; BRAO branch retinal artery occlusion; RT retinal tear; SB scleral buckle; PPV pars plana vitrectomy; VH Vitreous hemorrhage; PRP panretinal laser photocoagulation; IVK intravitreal kenalog; VMT vitreomacular traction; MH Macular hole;  NVD neovascularization of the disc; NVE neovascularization elsewhere; AREDS age related eye disease study; ARMD age related macular degeneration; POAG primary open angle glaucoma; EBMD epithelial/anterior basement membrane dystrophy; ACIOL anterior chamber intraocular lens; IOL intraocular lens; PCIOL posterior chamber intraocular lens; Phaco/IOL phacoemulsification with intraocular lens placement; [place] photorefractive keratectomy; LASIK laser assisted in situ keratomileusis; HTN hypertension; DM diabetes mellitus; COPD chronic obstructive pulmonary disease

## 2017-03-22 NOTE — Transfer of Care (Signed)
Immediate Anesthesia Transfer of Care Note  Patient: Jerome Mays  Procedure(s) Performed: PARS PLANA VITRECTOMY WITH 25 GAUGE (Left ) POSSIBLE MEMBRANE PEEL WITH SILICONE OIL AND PERFLURON (Left )  Patient Location: PACU  Anesthesia Type:General  Level of Consciousness: drowsy and patient cooperative  Airway & Oxygen Therapy: Patient Spontanous Breathing and Patient connected to nasal cannula oxygen  Post-op Assessment: Report given to RN  Post vital signs: Reviewed and stable  Last Vitals:  Vitals:   03/22/17 0554 03/22/17 1145  BP: (!) 147/66 134/67  Pulse: (!) 56 81  Resp: 20 15  Temp: 36.4 C 36.4 C  SpO2: 95% 94%    Last Pain:  Vitals:   03/22/17 0554  TempSrc: Oral      Patients Stated Pain Goal: 5 (16/01/09 3235)  Complications: No apparent anesthesia complications

## 2017-03-22 NOTE — Anesthesia Postprocedure Evaluation (Signed)
Anesthesia Post Note  Patient: Jerome Mays  Procedure(s) Performed: PARS PLANA VITRECTOMY WITH 25 GAUGE (Left ) POSSIBLE MEMBRANE PEEL WITH SILICONE OIL AND PERFLURON (Left )     Patient location during evaluation: PACU Anesthesia Type: General Level of consciousness: awake and alert Pain management: pain level controlled Vital Signs Assessment: post-procedure vital signs reviewed and stable Respiratory status: spontaneous breathing, nonlabored ventilation, respiratory function stable and patient connected to nasal cannula oxygen Cardiovascular status: blood pressure returned to baseline and stable Postop Assessment: no apparent nausea or vomiting Anesthetic complications: no    Last Vitals:  Vitals:   03/22/17 1145 03/22/17 1200  BP: 134/67 127/65  Pulse: 81 81  Resp: 15 16  Temp: 36.4 C   SpO2: 94% 94%    Last Pain:  Vitals:   03/22/17 1214  TempSrc:   PainSc: 7                  Jerome Mays

## 2017-03-22 NOTE — Anesthesia Procedure Notes (Addendum)
Procedure Name: Intubation Date/Time: 03/22/2017 7:34 AM Performed by: Sampson Si E Pre-anesthesia Checklist: Patient identified, Emergency Drugs available, Suction available and Patient being monitored Patient Re-evaluated:Patient Re-evaluated prior to induction Oxygen Delivery Method: Circle System Utilized Preoxygenation: Pre-oxygenation with 100% oxygen Induction Type: IV induction Ventilation: Mask ventilation without difficulty Laryngoscope Size: Mac and 3 Grade View: Grade I Tube type: Oral Tube size: 7.5 mm Number of attempts: 1 Airway Equipment and Method: Stylet and Oral airway Placement Confirmation: ETT inserted through vocal cords under direct vision,  positive ETCO2 and breath sounds checked- equal and bilateral Secured at: 22 cm Tube secured with: Tape Dental Injury: Teeth and Oropharynx as per pre-operative assessment

## 2017-03-22 NOTE — Discharge Instructions (Addendum)
POSTOPERATIVE INSTRUCTIONS  Your doctor has performed vitreoretinal surgery on you at [facility].  - Keep eye patched and shielded until seen by Dr. Akram Kissick 8 AM tomorrow in clinic - Do not use drops until return - FACE DOWN POSITIONING WHILE AWAKE - Sleep with belly down or on right side, avoid laying flat on back.    - No strenuous bending, stooping or lifting.  - You may not drive until further notice.  - If your doctor used a gas bubble in your eye during the procedure he will advise you on postoperative positioning. If you have a gas bubble you will be wearing a green bracelet that was applied in the operating room. The green bracelet should stay on as long as the gas bubble is in your eye. While the gas bubble is present you should not fly in an airplane. If you require general anesthesia while the gas bubble is present you must notify your anesthesiologist that an intraocular gas bubble is present so he can take the appropriate precautions.  - Tylenol or any other over-the-counter pain reliever can be used according to your doctor. If more pain medicine is required, your doctor will have a prescription for you.  - You may read, go up and down stairs, and watch television.     Anais Denslow, M.D., Ph.D.  

## 2017-03-22 NOTE — Op Note (Signed)
Date of Surgery: 10.15.18  Pre-Op Diagnosis: Tractional Retinal Detachment Left Eye  Post-op Diagnosis:Tractional Retinal Detachment Left Eye  Procedure:  1. 25 gauge [place] Vitrectomy 2. Removal of Silicone Oil  3. Membrane Peeling 4. Perflurocarbon 5. Retinectomy 6. Fluid-Air Exchange 7. Laser Retinopexy 8. Injection 4944 cs silicon oil, Left Eye  CPT Codes:  1. [id number] LT Modifier 78 2. [id number] LT Modifier 78  Surgeon: Gardiner Sleeper, MD, PhD  Assistant:  Alyse Low, COA   Anesthesia: General  Estimated Blood Loss <1 cc  Complications: None  Brief history: The patient has a history of a chronic mac-off rhegmatogenous retinal detachment, left eye, and is s/p retinal detachment repair by scleral buckle + PPV OS on 02/17/17. The patient was doing well, but noted decreased vision in the affected eye, and on examination, was noted to have a tractional retinal detachment, affecting activities of daily living.The risks, benefits, and alternatives were explained to the patient, including pain, bleeding, infection, loss of vision, double vision, droopy eyelids, and need for more surgeries.Informed consent was obtained from the patient and placed in the chart.    Procedure: Patient was seen in the Pre-operative area. After all remaining questions were answered, the operative "Left Eye" was marked and the informed consent was confirmed. The patient was brought back to the operating room by the nursing staff where general endotracheal anesthesia was induced. A documented time-out was performed. All in attendance (the nursing staff, anesthesia staff, and ophthalmology staff) agreed upon the patient, type of surgery, location of surgery, and patient allergies. The operative eye was then prepped and draped in the usual sterile ophthalmic fashion, followed by placement of a lid speculum.   Subconjunctival silicon oil was noted in the inferotemporal quadrant. A 360  conjunctival peritomy was created using Westcott scissors and 0.12 forceps. The subconjunctival silicon oil was rinsed out of the subconj space and the prior sclerotomies were inspected. The inferotemporal vicryl appeared loose and was removed. The sclerotomy was resutured using 7-0 vicryl. The other suture sites appeared intact and closed.  A 25gauge trocar was placed in the inferotemporal quadrant in a beveled fashion, taking care to avoid the prior sclerotomy site. The infusion line was brought to the operative field and found to be functional and free of air bubbles. The infusion line was placed through this trocar and the infusion cannula was confirmed in the vitreous cavity with no incarceration of retina or choroid prior to turning it on.  The infusion was secured with steristrips. Two additional 25gauge trocars were placed in the superonasal and superotemporal quadrants(10 and 2 oclock, respectively)in a similar beveled fashion.  The BIOM viewing system was brought into place and the retina was visualized under silicon oil. The temporal macula and fovea were detached with significant PVR membranes contracted and extending posteriorly toward the fovea. PVR membranes were stripped and peeled from the retinal surface using the light pipe, ILM and maxgrip forceps, and a macular contact. Once all the membranes that could be removed safely were peeled, silicone oil was removed with the silicone oil extraction syringe.   Next vitrectomy was performed under the wide-field viewing system. The patient had previous surgery with scleral buckle. The retina was attached nasal 180 and along the buckle superiorly and inferiorly. Posterior to the buckle, there was proliferative vitreoretinopathy (PVR) with detachment of the retina and subretinal fluid into the macula. The macula was completely detached with the temporal macula having significant A-P foreshortening.    Kenalog was used  to stain membranes.  Peripheral PVR membranes were peeled using the wide-field viewing system. Perflurocarbon was used to flatten the posterior pole. Diathermy was used to mark a relaxing retinectomy from 2-7 oclock. Additional perfluorocarbon was used to flatten the retina after creation of the retinectomy. Air-fluid exchange was performed with drainage of all subretinal fluid using the soft tip cannula. Using the lighted endolaser, the border of the retinectomy was lasered as well as additional laser just posterior to the scleral buckle. A BSS wash was performed to rinse any residual Perfluoron, which was again carefully aspirated using the soft tip cannula. A small iridectomy was created at 6 oclock using the vitrectomy probe. Then 6945 cs silicon oil was injected via the superior nasal trocar and filled up to the level of the lens plane with the aide of the venting cannula.  Supplemental anterior laser was placed under oil using the lighted endolaser and scleral depressor.   The three trocars were removed and sutured closed with7-0 vicryl sutures. The eye was confirmed to be at a physiologic level by digital palpation. Subconjunctival injections of antibiotic and kenalogwere administered. The lid speculum and drapes were removed. Drops of an antibiotic, anti-ocular hypertensives and steroid were given. The eye was patched and shielded. The patient tolerated the procedure well without any intraoperative or immediate postoperative complications. The patient was taken to the recovery room in good conditionto be discharged home. The patient was instructed to maintain a strict face-down position andwill be seen by Dr. Baltazar Najjar.

## 2017-03-22 NOTE — Brief Op Note (Signed)
03/22/2017  12:01 PM  PATIENT:  Jerome Mays  76 y.o. male  PRE-OPERATIVE DIAGNOSIS:  TRACTIONAL RETINAL DETACHMENT OF LEFT EYE  POST-OPERATIVE DIAGNOSIS:  TRACTIONAL RETINAL DETACHMENT OF LEFT EYE  PROCEDURE:  Procedure(s): PARS PLANA VITRECTOMY WITH 25 GAUGE (Left) POSSIBLE MEMBRANE PEEL WITH SILICONE OIL AND PERFLURON (Left)  SURGEON:  Surgeon(s) and Role:    [name], MD - Primary  ASSISTANTS: Alyse Low, COA   ANESTHESIA:   general  EBL:  Total I/O In: 1000 [I.V.:1000] Out: 75 [Urine:75]  BLOOD ADMINISTERED:none  PLAN OF CARE: Discharge to home after PACU  PATIENT DISPOSITION:  PACU - hemodynamically stable.   Delay start of Pharmacological VTE agent (>24hrs) due to surgical blood loss or risk of bleeding: not applicable

## 2017-03-22 NOTE — Anesthesia Preprocedure Evaluation (Signed)
Anesthesia Evaluation  Patient identified by MRN, date of birth, ID band Patient awake    Reviewed: Allergy & Precautions, NPO status , Patient's Chart, lab work & pertinent test results  Airway Mallampati: II  TM Distance: >3 FB Neck ROM: Full    Dental no notable dental hx.    Pulmonary COPD, former smoker,    Pulmonary exam normal breath sounds clear to auscultation       Cardiovascular negative cardio ROS Normal cardiovascular exam Rhythm:Regular Rate:Normal     Neuro/Psych negative neurological ROS  negative psych ROS   GI/Hepatic negative GI ROS, Neg liver ROS,   Endo/Other  negative endocrine ROS  Renal/GU negative Renal ROS  negative genitourinary   Musculoskeletal negative musculoskeletal ROS (+)   Abdominal   Peds negative pediatric ROS (+)  Hematology negative hematology ROS (+)   Anesthesia Other Findings   Reproductive/Obstetrics negative OB ROS                             Anesthesia Physical Anesthesia Plan  ASA: II  Anesthesia Plan: General   Post-op Pain Management:    Induction: Intravenous  PONV Risk Score and Plan: 2 and Ondansetron, Dexamethasone and Treatment may vary due to age or medical condition  Airway Management Planned: Oral ETT  Additional Equipment:   Intra-op Plan:   Post-operative Plan: Extubation in OR  Informed Consent: I have reviewed the patients History and Physical, chart, labs and discussed the procedure including the risks, benefits and alternatives for the proposed anesthesia with the patient or authorized representative who has indicated his/her understanding and acceptance.   Dental advisory given  Plan Discussed with: CRNA  Anesthesia Plan Comments:         Anesthesia Quick Evaluation

## 2017-03-22 NOTE — Interval H&P Note (Signed)
History and Physical Interval Note:  03/22/2017 6:52 AM  Jerome Mays  has presented today for surgery, with the diagnosis of TRACTIONAL RETINAL DETACHMENT OF LEFT EYE  The various methods of treatment have been discussed with the patient and family. After consideration of risks, benefits and other options for treatment, the patient has consented to  Procedure(s): PARS PLANA VITRECTOMY WITH 25 GAUGE (Left) POSSIBLE MEMBRANE PEEL WITH SILICONE OIL AND PERFLURON (Left) as a surgical intervention .  The patient's history has been reviewed, patient examined, no change in status, stable for surgery.  I have reviewed the patient's chart and labs.  Questions were answered to the patient's satisfaction.     Jerome Mays

## 2017-03-23 ENCOUNTER — Encounter (INDEPENDENT_AMBULATORY_CARE_PROVIDER_SITE_OTHER): Payer: Self-pay | Admitting: Ophthalmology

## 2017-03-23 ENCOUNTER — Ambulatory Visit (INDEPENDENT_AMBULATORY_CARE_PROVIDER_SITE_OTHER): Payer: Medicare Other | Admitting: Ophthalmology

## 2017-03-23 DIAGNOSIS — Z961 Presence of intraocular lens: Secondary | ICD-10-CM

## 2017-03-23 DIAGNOSIS — H3522 Other non-diabetic proliferative retinopathy, left eye: Secondary | ICD-10-CM

## 2017-03-23 DIAGNOSIS — H3322 Serous retinal detachment, left eye: Secondary | ICD-10-CM

## 2017-03-23 MED ORDER — PREDNISONE 20 MG PO TABS: 80.0000 mg | ORAL_TABLET | Freq: Every day | ORAL | 1 refills | Status: DC

## 2017-03-24 ENCOUNTER — Telehealth (INDEPENDENT_AMBULATORY_CARE_PROVIDER_SITE_OTHER): Payer: Self-pay | Admitting: Ophthalmology

## 2017-03-24 NOTE — Telephone Encounter (Signed)
Pt wife called needing refill of "tan top and red top"gtts; Spoke to pt wife and will give atropine at appointment on Friday (10.19.18); Pt wife expressed understanding; To call back if any other issues arise -Alyse Low, COA

## 2017-03-25 NOTE — Progress Notes (Signed)
[facility] Note  03/26/2017     CHIEF COMPLAINT Patient presents for Post-op Follow-up   HISTORY OF PRESENT ILLNESS: Jerome Mays is a 76 y.o. male who presents to the clinic today for:   HPI    Post-op Follow-up  In left eye.  Discomfort includes none.  Vision is improved.        Comments  POV 2; S/P SBP (42 band) + 25g PPV/PFC/EL/FAX/C3F8 OS (09.12.18) S/P PPV w/ membrane peel/relaxing retinectomy/PFC/SOX OS under GA (10.15.18);  Pt states he feels the OS is healing well; Pt states that he no longer is having headaches, states that he is able to see a little better; Pt PF 6x day OS, zymaxid OS QID, atropine OS BID, alphagan OS BID, cosopt OS BID, PSO ung QID OS, oral pred 71m qd;      Last edited by RAlyse Lowon 03/26/2017  8:07 AM. (History)      Referring physician: ABurnard Bunting MD [address] [place] [id number] HISTORICAL INFORMATION:   Selected notes from the MEDICAL RECORD NUMBER Initial referral from Dr. SGershon Cranefor RD OS s/p SBP (42 band) + 25g PPV/PFC/EL/FAX/C3F8 OS 9.12.18 s/p PPV w/ membrane peel/relaxing retinectomy/PFC/SOX OS under GA, 10.15.18   CURRENT MEDICATIONS: Current Outpatient Prescriptions (Ophthalmic Drugs)  Medication Sig  . brimonidine (ALPHAGAN) 0.2 % ophthalmic solution Place 1 drop into the left eye 2 (two) times daily.  . carboxymethylcellulose (REFRESH) 1 % ophthalmic solution Apply 1 drop to eye 3 (three) times daily.  . dorzolamide-timolol (COSOPT) 22.3-6.8 MG/ML ophthalmic solution Place 1 drop into the left eye 2 (two) times daily.  .Marland Kitchengatifloxacin (ZYMAXID) 0.5 % SOLN Place 1 drop into both eyes 4 (four) times daily.  .Vladimir FasterGlycol-Propyl Glycol (SYSTANE OP) Place 1 drop into both eyes as needed (for dry eyes).  . prednisoLONE acetate (PRED FORTE) 1 % ophthalmic suspension Place 1 drop into the left eye 6 (six) times daily.   No current facility-administered medications for  this visit.  (Ophthalmic Drugs)   Current Outpatient Prescriptions (Other)  Medication Sig  . ADVAIR DISKUS 250-50 MCG/DOSE AEPB INHALE 1 PUFF INTO LUNGS TWICE DAILY  . allopurinol (ZYLOPRIM) 300 MG tablet Take 300 mg by mouth daily.   .Marland Kitchenaspirin EC 81 MG tablet Take 81 mg by mouth daily.  .Marland KitchenDALIRESP 500 MCG TABS tablet TAKE 1 TABLET BY MOUTH ONCE DAILY (Patient taking differently: TAKE 500 MCG BY MOUTH ONCE DAILY)  . diphenhydramine-acetaminophen (TYLENOL PM) 25-500 MG TABS tablet Take 2 tablets by mouth at bedtime.  .Marland KitchenHYDROcodone-acetaminophen (NORCO/VICODIN) 5-325 MG tablet Take 1 tablet by mouth every 4 (four) hours as needed for moderate pain.  . Multiple Vitamin (ONE-A-DAY MENS PO) Take 1 tablet by mouth daily.  . predniSONE (DELTASONE) 20 MG tablet Take 4 tablets (80 mg total) by mouth daily.   No current facility-administered medications for this visit.  (Other)      REVIEW OF SYSTEMS: ROS    Positive for: Eyes   Negative for: Constitutional, Gastrointestinal, Neurological, Skin, Genitourinary, Musculoskeletal, HENT, Endocrine, Cardiovascular, Respiratory, Psychiatric, Allergic/Imm, Heme/Lymph   Last edited by RAlyse Lowon 03/26/2017  8:07 AM. (History)       ALLERGIES Allergies  Allergen Reactions  . Lipitor [Atorvastatin] Rash and Other (See Comments)    Passed out    PAST MEDICAL HISTORY Past Medical History:  Diagnosis Date  . Anemia    low iron  . Arthritis   .  Colonic polyp   . Constipation   . COPD (chronic obstructive pulmonary disease) ([place])   . GERD (gastroesophageal reflux disease)   . Gout   . History of kidney stones   . Malignant tumor of kidney ([place])   . Pain    LOWER BACK AND LEFT HIP - HX OF PREVIOUS LUMBAR AND CERVICAL SURGERY--PT STATES HE HAS HAD NUMBNESS IN BOTH FEET SINCE HIS BACK SURGERY  . Renal calculus   . Shortness of breath    Past Surgical History:  Procedure Laterality Date  . BACK SURGERY     LUMBAR SURGERY  .  CATARACT EXTRACTION Bilateral 2003   Surgeon unknown  . EYE SURGERY     BILATERAL CATARACT EXTRACTIONS  . GIVENS CAPSULE STUDY N/A 10/07/2015   Procedure: GIVENS CAPSULE STUDY;  Surgeon: Carol Ada, MD;  Location: [facility];  Service: Endoscopy;  Laterality: N/A;  . HAND SURGERY     left  . KNEE SURGERY     right  . LAMINECTOMY     cervical  . MEMBRANE PEEL Left 03/22/2017   Procedure: POSSIBLE MEMBRANE PEEL WITH SILICONE OIL AND PERFLURON;  Surgeon: [name], MD;  Location: [facility];  Service: Ophthalmology;  Laterality: Left;  . NEPHRECTOMY     PT STATES RIGHT KIDNEY WAS REMOVED  . PARS PLANA VITRECTOMY Left 03/22/2017   Procedure: PARS PLANA VITRECTOMY WITH 25 GAUGE;  Surgeon: [name], MD;  Location: [facility];  Service: Ophthalmology;  Laterality: Left;  . 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 TOTAL KNEE ARTHROPLASTY Left 10/19/2012   Procedure: LEFT TOTAL KNEE ARTHROPLASTY;  Surgeon: Magnus Sinning, MD;  Location: [facility];  Service: Orthopedics;  Laterality: Left;  [name] VITRECTOMY 25 GAUGE WITH SCLERAL BUCKLE Left 02/17/2017   Procedure: VITRECTOMY 25 GAUGE WITH SCLERAL BUCKLE membrane peel, injection of silicone oil, and endolaser photocoagulation.;  Surgeon: [name], MD;  Location: [facility];  Service: Ophthalmology;  Laterality: Left;    FAMILY HISTORY Family History  Problem Relation Age of Onset  . Diabetes Mother   . COPD Brother   . COPD Sister   . Amblyopia Neg Hx   . Blindness Neg Hx   . Cataracts Neg Hx   . Glaucoma Neg Hx   . Macular degeneration Neg Hx   . Retinal detachment Neg Hx   . Retinitis pigmentosa Neg Hx     SOCIAL HISTORY Social History  Substance Use Topics  . Smoking status: Former Smoker    Packs/day: 2.00    Years: 40.00    Types: Cigarettes, Pipe    Quit date: 06/09/1995  . Smokeless tobacco: Never Used  . Alcohol use No         OPHTHALMIC EXAM:  Base Eye Exam    Visual Acuity (Snellen - Linear)      Right Left   Dist [place]  20/25 HM   Dist ph [place] 20/20 NI   Pt states that he is able to see movement farther away       Tonometry (Tonopen, 8:12 AM)      Right Left   Pressure 11 11       Pupils      Dark Shape React APD   Right       Left 7 Round NR None       Extraocular Movement      Right Left    Full Full       Neuro/Psych    Oriented x3:  Yes  Mood/Affect:  Normal       Dilation    Left eye:  1.0% Mydriacyl, 2.5% Phenylephrine @ 8:11 AM        Slit Lamp and Fundus Exam    External Exam      Right Left   External Normal Periorbital edema improving       Slit Lamp Exam      Right Left   Lids/Lashes Dermatochalasis - upper lid Dermatochalasis - upper lid   Conjunctiva/Sclera White and quiet Sutures intact; Chemosis - temporally   Cornea Clear 1+ K edema with 2-3+ descemet folds; 2+ PEE; no epi defect   Anterior Chamber Deep and quiet Deep   Iris Round and dilated Round and dilated - dilation 65m   Lens Three piece Posterior chamber intraocular lens Three piece Posterior chamber intraocular lens; 1+ condensation on posterior surface of lens   Vitreous Vitreous syneresis Post vitrectomy with good silicone oil fill;        Fundus Exam      Right Left   Disc  Normal   C/D Ratio  0.45   Macula  attached under oil   Vessels  Vascular attenuation - mild   Periphery  moderate buckle height; temporal/inf retinectomy from 2-630 with peripheral edge attachd with good laser at the edge; mild heme sup temp edge          IMAGING AND PROCEDURES  Imaging and Procedures for 03/26/17           ASSESSMENT/PLAN:    ICD-10-CM   1. 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 - inferior detachment from 2-10 oclock with HST at ~530 - fovea off, but sup nasal macula attached - by history RD likely started 4-5 wks prior to initial presentation, but  fovea became involved ~Saturday, 02/13/17 - s/p SBP (42 band) + 25g PPV/PFC/EL/FAX/C3F8 OS 9.12.18 - extensive PVR along inf temp arcades with tractional redetachment inf temp noted last visit, POM1 - s/p PPV w/ membrane peel/relaxing retinectomy/PFC/SOX OS under GA, 10.15.18             - doing well this morning             - retina attached and in good position             - IOP improvedtoday             - cont   PF 6x/day OS                         zymaxid QID OS-- STOP Tuesday or when bottle runs out                         Atropine BID OS                         Alphagan P BID OS                         Cosopt BID OS                         PSO ung QID OS  - cont oral prednisone -- 80 mg x 7 days (on day  4), then 40 mg x 7 days, then 20 mg x 7 days             - can dc face down positioning; sleep on right side; avoid laying flat on back             - eye shield when sleeping             - post op drop and positioning instructions reviewed             - tylenol/ibuprofen for pain             - f/u 1 wk  3. Pseudophakia OU - IOLs in good position OU - monitor  Ophthalmic Meds Ordered this visit:  No orders of the defined types were placed in this encounter.      Return in about 1 week (around 04/02/2017) for POV 3.  There are no Patient Instructions on file for this visit.   Explained the diagnoses, plan, and follow up with the patient and they expressed understanding.  Patient expressed understanding of the importance of proper follow up care.   Gardiner Sleeper, M.D., Ph.D. Diseases & Surgery of the Retina and Vitreous [facility] 03/26/17      Abbreviations: M myopia (nearsighted); A astigmatism; H hyperopia (farsighted); P presbyopia; Mrx spectacle prescription;  CTL contact lenses; OD right eye; OS left eye; OU both eyes  XT exotropia; ET esotropia; PEK punctate epithelial keratitis; PEE punctate epithelial erosions; DES dry eye  syndrome; MGD meibomian gland dysfunction; ATs artificial tears; PFAT's preservative free artificial tears; [place] nuclear sclerotic cataract; PSC posterior subcapsular cataract; ERM epi-retinal membrane; PVD posterior vitreous detachment; RD retinal detachment; DM diabetes mellitus; DR diabetic retinopathy; NPDR non-proliferative diabetic retinopathy; PDR proliferative diabetic retinopathy; CSME clinically significant macular edema; DME diabetic macular edema; dbh dot blot hemorrhages; CWS cotton wool spot; POAG primary open angle glaucoma; C/D cup-to-disc ratio; HVF humphrey visual field; GVF goldmann visual field; OCT optical coherence tomography; IOP intraocular pressure; BRVO Branch retinal vein occlusion; CRVO central retinal vein occlusion; CRAO central retinal artery occlusion; BRAO branch retinal artery occlusion; RT retinal tear; SB scleral buckle; PPV pars plana vitrectomy; VH Vitreous hemorrhage; PRP panretinal laser photocoagulation; IVK intravitreal kenalog; VMT vitreomacular traction; MH Macular hole;  NVD neovascularization of the disc; NVE neovascularization elsewhere; AREDS age related eye disease study; ARMD age related macular degeneration; POAG primary open angle glaucoma; EBMD epithelial/anterior basement membrane dystrophy; ACIOL anterior chamber intraocular lens; IOL intraocular lens; PCIOL posterior chamber intraocular lens; Phaco/IOL phacoemulsification with intraocular lens placement; [place] photorefractive keratectomy; LASIK laser assisted in situ keratomileusis; HTN hypertension; DM diabetes mellitus; COPD chronic obstructive pulmonary disease

## 2017-03-26 ENCOUNTER — Ambulatory Visit (INDEPENDENT_AMBULATORY_CARE_PROVIDER_SITE_OTHER): Payer: Medicare Other | Admitting: Ophthalmology

## 2017-03-26 ENCOUNTER — Encounter (INDEPENDENT_AMBULATORY_CARE_PROVIDER_SITE_OTHER): Payer: Self-pay | Admitting: Ophthalmology

## 2017-03-26 DIAGNOSIS — H3522 Other non-diabetic proliferative retinopathy, left eye: Secondary | ICD-10-CM

## 2017-03-26 DIAGNOSIS — H3322 Serous retinal detachment, left eye: Secondary | ICD-10-CM

## 2017-03-26 DIAGNOSIS — Z961 Presence of intraocular lens: Secondary | ICD-10-CM

## 2017-03-26 MED FILL — Ophthalmic Irrigation Solution - Intraocular: INTRAOCULAR | Qty: 500 | Status: AC

## 2017-03-31 NOTE — Progress Notes (Signed)
[facility] Note  04/01/2017     CHIEF COMPLAINT Patient presents for Post-op Follow-up   HISTORY OF PRESENT ILLNESS: Jerome Mays is a 76 y.o. male who presents to the clinic today for:   HPI    Post-op Follow-up  In left eye.  Discomfort includes none.  I, the attending physician,  performed the HPI with the patient and updated documentation appropriately.        Comments  S/P SBP (42 band) +25g PPV/PFC/EL/FAX/C3F8 OS (02/17/2017); S/P PPV w/ membrane peel/relaxing retinectomy/PFC/SOX/ OS under GA (03/22/2017); Pt states Va is better can see the window pane, and can see the tv but not anyones face. Pt states using PF 6xday OS, Atropine BID OS, Alphagan BID OS, Cosopt BID OS, PSO ung QID OS as directed; Pt states that he finishes the zymaxid;      Last edited by [name], MD on 04/01/2017  8:18 AM. (History)      Referring physician: [name], MD [place], [place] [address]  HISTORICAL INFORMATION:   Selected notes from the MEDICAL RECORD NUMBER Initial referral from Dr. [name] for RD OS s/p SBP (42 band) + 25g PPV/PFC/EL/FAX/C3F8 OS 9.12.18 s/p PPV w/ membrane peel/relaxing retinectomy/PFC/SOX OS under GA, 10.15.18   CURRENT MEDICATIONS: Current Outpatient Prescriptions (Ophthalmic Drugs)  Medication Sig  . brimonidine (ALPHAGAN) 0.2 % ophthalmic solution Place 1 drop into the left eye 2 (two) times daily.  . carboxymethylcellulose (REFRESH) 1 % ophthalmic solution Apply 1 drop to eye 3 (three) times daily.  . dorzolamide-timolol (COSOPT) 22.3-6.8 MG/ML ophthalmic solution Place 1 drop into the left eye 2 (two) times daily.  [name] gatifloxacin (ZYMAXID) 0.5 % SOLN Place 1 drop into both eyes 4 (four) times daily.  Vladimir Faster Glycol-Propyl Glycol (SYSTANE OP) Place 1 drop into both eyes as needed (for dry eyes).  . prednisoLONE acetate (PRED FORTE) 1 % ophthalmic suspension Place 1 drop into the left eye 6 (six)  times daily.   No current facility-administered medications for this visit.  (Ophthalmic Drugs)   Current Outpatient Prescriptions (Other)  Medication Sig  . ADVAIR DISKUS 250-50 MCG/DOSE AEPB INHALE 1 PUFF INTO LUNGS TWICE DAILY  . allopurinol (ZYLOPRIM) 300 MG tablet Take 300 mg by mouth daily.   [name] aspirin EC 81 MG tablet Take 81 mg by mouth daily.  [name] DALIRESP 500 MCG TABS tablet TAKE 1 TABLET BY MOUTH ONCE DAILY (Patient taking differently: TAKE 500 MCG BY MOUTH ONCE DAILY)  . diphenhydramine-acetaminophen (TYLENOL PM) 25-500 MG TABS tablet Take 2 tablets by mouth at bedtime.  [name] HYDROcodone-acetaminophen (NORCO/VICODIN) 5-325 MG tablet Take 1 tablet by mouth every 4 (four) hours as needed for moderate pain.  . Multiple Vitamin (ONE-A-DAY MENS PO) Take 1 tablet by mouth daily.  . predniSONE (DELTASONE) 20 MG tablet Take 4 tablets (80 mg total) by mouth daily.   No current facility-administered medications for this visit.  (Other)      REVIEW OF SYSTEMS: ROS    Positive for: Musculoskeletal, Eyes   Negative for: Constitutional, Gastrointestinal, Neurological, Skin, Genitourinary, HENT, Endocrine, Cardiovascular, Respiratory, Psychiatric, Allergic/Imm, Heme/Lymph   Last edited by Elmore Guise on 04/01/2017  7:58 AM. (History)       ALLERGIES Allergies  Allergen Reactions  . Lipitor [Atorvastatin] Rash and Other (See Comments)    Passed out    PAST MEDICAL HISTORY Past Medical History:  Diagnosis Date  . Anemia    low iron  .  Arthritis   . Colonic polyp   . Constipation   . COPD (chronic obstructive pulmonary disease) ([facility])   . GERD (gastroesophageal reflux disease)   . Gout   . History of kidney stones   . Malignant tumor of kidney ([facility])   . Pain    LOWER BACK AND LEFT HIP - HX OF PREVIOUS LUMBAR AND CERVICAL SURGERY--PT STATES HE HAS HAD NUMBNESS IN BOTH FEET SINCE HIS BACK SURGERY  . Renal calculus   . Shortness of breath    Past Surgical History:  Procedure  Laterality Date  . BACK SURGERY     LUMBAR SURGERY  . CATARACT EXTRACTION Bilateral 2003   Surgeon unknown  . EYE SURGERY     BILATERAL CATARACT EXTRACTIONS  . GIVENS CAPSULE STUDY N/A 10/07/2015   Procedure: GIVENS CAPSULE STUDY;  Surgeon: Carol Ada, MD;  Location: [facility];  Service: Endoscopy;  Laterality: N/A;  . HAND SURGERY     left  . KNEE SURGERY     right  . LAMINECTOMY     cervical  . MEMBRANE PEEL Left 03/22/2017   Procedure: POSSIBLE MEMBRANE PEEL WITH SILICONE OIL AND PERFLURON;  Surgeon: [name], MD;  Location: [facility];  Service: Ophthalmology;  Laterality: Left;  . NEPHRECTOMY     PT STATES RIGHT KIDNEY WAS REMOVED  . PARS PLANA VITRECTOMY Left 03/22/2017   Procedure: PARS PLANA VITRECTOMY WITH 25 GAUGE;  Surgeon: [name], MD;  Location: [facility];  Service: Ophthalmology;  Laterality: Left;  . 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 TOTAL KNEE ARTHROPLASTY Left 10/19/2012   Procedure: LEFT TOTAL KNEE ARTHROPLASTY;  Surgeon: Magnus Sinning, MD;  Location: [facility];  Service: Orthopedics;  Laterality: Left;  [name] VITRECTOMY 25 GAUGE WITH SCLERAL BUCKLE Left 02/17/2017   Procedure: VITRECTOMY 25 GAUGE WITH SCLERAL BUCKLE membrane peel, injection of silicone oil, and endolaser photocoagulation.;  Surgeon: [name], MD;  Location: [facility];  Service: Ophthalmology;  Laterality: Left;    FAMILY HISTORY Family History  Problem Relation Age of Onset  . Diabetes Mother   . COPD Brother   . COPD Sister   . Amblyopia Neg Hx   . Blindness Neg Hx   . Cataracts Neg Hx   . Glaucoma Neg Hx   . Macular degeneration Neg Hx   . Retinal detachment Neg Hx   . Retinitis pigmentosa Neg Hx     SOCIAL HISTORY Social History  Substance Use Topics  . Smoking status: Former Smoker    Packs/day: 2.00    Years: 40.00    Types: Cigarettes, Pipe    Quit date: 06/09/1995  . Smokeless tobacco: Never Used  . Alcohol use No         OPHTHALMIC EXAM:  Base Eye Exam     Visual Acuity (Snellen - Linear)      Right Left   Dist cc 20/25 CF at 1'   Dist ph cc NI NI       Tonometry (Tonopen, 8:10 AM)      Right Left   Pressure 13 09       Pupils      Pupils Dark Light Shape React APD   Right PERRL 3 2 Round Minimal None   Left PERRL 7 6 Irregular Sluggish None   OS pharm dilated       Extraocular Movement      Right Left    Full Full       Neuro/Psych  Oriented x3:  Yes   Mood/Affect:  Normal       Dilation    Left eye:  1.0% Mydriacyl @ 8:10 AM        Slit Lamp and Fundus Exam    External Exam      Right Left   External Normal Periorbital edema improving       Slit Lamp Exam      Right Left   Lids/Lashes Dermatochalasis - upper lid Dermatochalasis - upper lid   Conjunctiva/Sclera White and quiet Sutures intact; Chemosis - resolved; mild [place] and erythema;     Cornea Clear 2+ K edema with descemet folds; 2+ PEE; no epi defect   Anterior Chamber Deep and quiet Deep   Iris Round and dilated Round and dilated - dilation 64m   Lens Three piece Posterior chamber intraocular lens Three piece Posterior chamber intraocular lens in good position   Vitreous Vitreous syneresis Post vitrectomy with good silicone oil fill;        Fundus Exam      Right Left   Disc  Normal   C/D Ratio  0.45   Macula  attached under oil   Vessels  Vascular attenuation - mild   Periphery  moderate buckle height; temporal/inf retinectomy from 2-630 with peripheral edge attachd with good laser at the edge; mild heme sup temp edge        Refraction    Manifest Refraction (Auto)      Sphere Cylinder Axis Dist [place]   Right       Left +5.50 +2.00 070 CF (NI)          IMAGING AND PROCEDURES  Imaging and Procedures for 04/01/17           ASSESSMENT/PLAN:    ICD-10-CM   1. 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 Mac-off inferior retinal detachment with  PVR OS - inferior detachment from 2-10 oclock with HST at ~530 - fovea off, but sup nasal macula attached - by history RD likely started 4-5 wks prior to initial presentation, but fovea became involved ~Saturday, 02/13/17 - s/p SBP (42 band) + 25g PPV/PFC/EL/FAX/C3F8 OS 9.12.18 - extensive PVR along inf temp arcades with tractional redetachment inf temp noted last visit, POM1 - s/p PPV w/ membrane peel/relaxing retinectomy/PFC/SOX OS under GA, 10.15.18             - did well this week             - retina attached and in good position             - IOP improvedtoday             - cont   PF 6x/day OS                         Atropine BID OS                         Alphagan P BID OS                         Cosopt BID OS -- STOP                         PSO ung QID OS  -  cont oral prednisone -- 80 mg x 7 days, then 40 mg x 7 days (on day 1), then 20 mg x 7 days             - can dc face down positioning; sleep on right side; avoid laying flat on back             - eye shield when sleeping             - post op drop and positioning instructions reviewed             - tylenol/ibuprofen for pain             - f/u 1 wk  3. Pseudophakia OU - IOLs in good position OU - monitor  Ophthalmic Meds Ordered this visit:  No orders of the defined types were placed in this encounter.      Return in about 1 week (around 04/08/2017) for POV.  There are no Patient Instructions on file for this visit.   Explained the diagnoses, plan, and follow up with the patient and they expressed understanding.  Patient expressed understanding of the importance of proper follow up care.   Gardiner Sleeper, M.D., Ph.D. Diseases & Surgery of the Retina and Vitreous [facility] 04/01/17      Abbreviations: M myopia (nearsighted); A astigmatism; H hyperopia (farsighted); P presbyopia; Mrx spectacle prescription;  CTL contact lenses; OD right eye; OS left eye; OU both eyes  XT  exotropia; ET esotropia; PEK punctate epithelial keratitis; PEE punctate epithelial erosions; DES dry eye syndrome; MGD meibomian gland dysfunction; ATs artificial tears; PFAT's preservative free artificial tears; [place] nuclear sclerotic cataract; PSC posterior subcapsular cataract; ERM epi-retinal membrane; PVD posterior vitreous detachment; RD retinal detachment; DM diabetes mellitus; DR diabetic retinopathy; NPDR non-proliferative diabetic retinopathy; PDR proliferative diabetic retinopathy; CSME clinically significant macular edema; DME diabetic macular edema; dbh dot blot hemorrhages; CWS cotton wool spot; POAG primary open angle glaucoma; C/D cup-to-disc ratio; HVF humphrey visual field; GVF goldmann visual field; OCT optical coherence tomography; IOP intraocular pressure; BRVO Branch retinal vein occlusion; CRVO central retinal vein occlusion; CRAO central retinal artery occlusion; BRAO branch retinal artery occlusion; RT retinal tear; SB scleral buckle; PPV pars plana vitrectomy; VH Vitreous hemorrhage; PRP panretinal laser photocoagulation; IVK intravitreal kenalog; VMT vitreomacular traction; MH Macular hole;  NVD neovascularization of the disc; NVE neovascularization elsewhere; AREDS age related eye disease study; ARMD age related macular degeneration; POAG primary open angle glaucoma; EBMD epithelial/anterior basement membrane dystrophy; ACIOL anterior chamber intraocular lens; IOL intraocular lens; PCIOL posterior chamber intraocular lens; Phaco/IOL phacoemulsification with intraocular lens placement; [place] photorefractive keratectomy; LASIK laser assisted in situ keratomileusis; HTN hypertension; DM diabetes mellitus; COPD chronic obstructive pulmonary disease

## 2017-04-01 ENCOUNTER — Encounter (INDEPENDENT_AMBULATORY_CARE_PROVIDER_SITE_OTHER): Payer: Self-pay | Admitting: Ophthalmology

## 2017-04-01 ENCOUNTER — Ambulatory Visit (INDEPENDENT_AMBULATORY_CARE_PROVIDER_SITE_OTHER): Payer: Medicare Other | Admitting: Ophthalmology

## 2017-04-01 DIAGNOSIS — H3322 Serous retinal detachment, left eye: Secondary | ICD-10-CM

## 2017-04-01 DIAGNOSIS — Z961 Presence of intraocular lens: Secondary | ICD-10-CM

## 2017-04-01 DIAGNOSIS — H3522 Other non-diabetic proliferative retinopathy, left eye: Secondary | ICD-10-CM

## 2017-04-06 ENCOUNTER — Other Ambulatory Visit: Payer: Self-pay | Admitting: Pulmonary Disease

## 2017-04-06 ENCOUNTER — Encounter (INDEPENDENT_AMBULATORY_CARE_PROVIDER_SITE_OTHER): Payer: Medicare Other | Admitting: Ophthalmology

## 2017-04-06 NOTE — Progress Notes (Signed)
[facility] Note  04/08/2017     CHIEF COMPLAINT Patient presents for Retina Follow Up   HISTORY OF PRESENT ILLNESS: Jerome Mays is a 76 y.o. male who presents to the clinic today for:   HPI    Retina Follow Up  Patient presents with  Retinal Break/Detachment.  In left eye.  Since onset it is stable.  I, the attending physician,  performed the HPI with the patient and updated documentation appropriately.        Comments  POV S/P SBP (42 Band) +25g PPV/PFC /EL/fax/C33f os 02/17/17 s/p ppv W/ membrane peel/ relaxing retinectomy/PFC SOX OS under GA (03/22/17) Patient states he vision is stable ,his left eye  Is sore from surgery. Denies pain floaters and flashes. He is Pred forte 6x/day, Alphagan BID, Atropine Bid, Abt onit. Bid. He is taking Vits Qd      Last edited by ZBernarda Caffey MD on 04/08/2017  9:25 AM. (History)      Referring physician: ABurnard Bunting MD [address] [place] [id number] HISTORICAL INFORMATION:   Selected notes from the MEDICAL RECORD NUMBER Initial referral from Dr. SGershon Cranefor RD OS s/p SBP (42 band) + 25g PPV/PFC/EL/FAX/C3F8 OS 9.12.18 s/p PPV w/ membrane peel/relaxing retinectomy/PFC/SOX OS under GA, 10.15.18   CURRENT MEDICATIONS: Current Outpatient Prescriptions (Ophthalmic Drugs)  Medication Sig  . brimonidine (ALPHAGAN) 0.2 % ophthalmic solution Place 1 drop into the left eye 2 (two) times daily.  .Marland Kitchengatifloxacin (ZYMAXID) 0.5 % SOLN Place 1 drop into both eyes 4 (four) times daily.  .Vladimir FasterGlycol-Propyl Glycol (SYSTANE OP) Place 1 drop into both eyes as needed (for dry eyes).  . prednisoLONE acetate (PRED FORTE) 1 % ophthalmic suspension Place 1 drop into the left eye 6 (six) times daily.  . bacitracin-polymyxin b (POLYSPORIN) ophthalmic ointment Place into the left eye at bedtime as needed. Place a 1/2 inch ribbon of ointment into the lower eyelid.  . carboxymethylcellulose (REFRESH) 1 %  ophthalmic solution Apply 1 drop to eye 3 (three) times daily.  . dorzolamide-timolol (COSOPT) 22.3-6.8 MG/ML ophthalmic solution Place 1 drop into the left eye 2 (two) times daily. (Patient not taking: Reported on 04/08/2017)   No current facility-administered medications for this visit.  (Ophthalmic Drugs)   Current Outpatient Prescriptions (Other)  Medication Sig  . ADVAIR DISKUS 250-50 MCG/DOSE AEPB INHALE 1 PUFF INTO LUNGS TWICE DAILY  . allopurinol (ZYLOPRIM) 300 MG tablet Take 300 mg by mouth daily.   .Marland Kitchenaspirin EC 81 MG tablet Take 81 mg by mouth daily.  .Marland KitchenDALIRESP 500 MCG TABS tablet TAKE 1 TABLET BY MOUTH ONCE DAILY (Patient taking differently: TAKE 500 MCG BY MOUTH ONCE DAILY)  . diphenhydramine-acetaminophen (TYLENOL PM) 25-500 MG TABS tablet Take 2 tablets by mouth at bedtime.  .Marland KitchenHYDROcodone-acetaminophen (NORCO/VICODIN) 5-325 MG tablet Take 1 tablet by mouth every 4 (four) hours as needed for moderate pain.  . Multiple Vitamin (ONE-A-DAY MENS PO) Take 1 tablet by mouth daily.  . predniSONE (DELTASONE) 20 MG tablet Take 4 tablets (80 mg total) by mouth daily.   No current facility-administered medications for this visit.  (Other)      REVIEW OF SYSTEMS: ROS    Positive for: Genitourinary, Musculoskeletal, Cardiovascular, Eyes   Negative for: Constitutional, Gastrointestinal, Neurological, Skin, HENT, Endocrine, Respiratory, Psychiatric, Allergic/Imm, Heme/Lymph   Last edited by KZenovia Jordan LPN on 117/09/812 84:81AM. (History)       ALLERGIES Allergies  Allergen Reactions  . Lipitor [Atorvastatin] Rash and Other (See Comments)    Passed out    PAST MEDICAL HISTORY Past Medical History:  Diagnosis Date  . Anemia    low iron  . Arthritis   . Colonic polyp   . Constipation   . COPD (chronic obstructive pulmonary disease) ([facility])   . GERD (gastroesophageal reflux disease)   . Gout   . History of kidney stones   . Malignant tumor of kidney ([facility])   . Pain     LOWER BACK AND LEFT HIP - HX OF PREVIOUS LUMBAR AND CERVICAL SURGERY--PT STATES HE HAS HAD NUMBNESS IN BOTH FEET SINCE HIS BACK SURGERY  . Renal calculus   . Shortness of breath    Past Surgical History:  Procedure Laterality Date  . BACK SURGERY     LUMBAR SURGERY  . CATARACT EXTRACTION Bilateral 2003   Surgeon unknown  . EYE SURGERY     BILATERAL CATARACT EXTRACTIONS  . GIVENS CAPSULE STUDY N/A 10/07/2015   Procedure: GIVENS CAPSULE STUDY;  Surgeon: Carol Ada, MD;  Location: [facility];  Service: Endoscopy;  Laterality: N/A;  . HAND SURGERY     left  . KNEE SURGERY     right  . LAMINECTOMY     cervical  . MEMBRANE PEEL Left 03/22/2017   Procedure: POSSIBLE MEMBRANE PEEL WITH SILICONE OIL AND PERFLURON;  Surgeon: [name], MD;  Location: [facility];  Service: Ophthalmology;  Laterality: Left;  . NEPHRECTOMY     PT STATES RIGHT KIDNEY WAS REMOVED  . PARS PLANA VITRECTOMY Left 03/22/2017   Procedure: PARS PLANA VITRECTOMY WITH 25 GAUGE;  Surgeon: [name], MD;  Location: [facility];  Service: Ophthalmology;  Laterality: Left;  . 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 TOTAL KNEE ARTHROPLASTY Left 10/19/2012   Procedure: LEFT TOTAL KNEE ARTHROPLASTY;  Surgeon: Magnus Sinning, MD;  [facility];  Service: Orthopedics;  Laterality: Left;  [name] VITRECTOMY 25 GAUGE WITH SCLERAL BUCKLE Left 02/17/2017   Procedure: VITRECTOMY 25 GAUGE WITH SCLERAL BUCKLE membrane peel, injection of silicone oil, and endolaser photocoagulation.;  Surgeon: [name], MD;  Location: [facility];  Service: Ophthalmology;  Laterality: Left;    FAMILY HISTORY Family History  Problem Relation Age of Onset  . Diabetes Mother   . COPD Brother   . COPD Sister   . Amblyopia Neg Hx   . Blindness Neg Hx   . Cataracts Neg Hx   . Glaucoma Neg Hx   . Macular degeneration Neg Hx   . Retinal detachment Neg Hx   . Retinitis pigmentosa Neg Hx     SOCIAL HISTORY Social History  Substance Use Topics  . Smoking  status: Former Smoker    Packs/day: 2.00    Years: 40.00    Types: Cigarettes, Pipe    Quit date: 06/09/1995  . Smokeless tobacco: Never Used  . Alcohol use No         OPHTHALMIC EXAM:  Base Eye Exam    Visual Acuity (Snellen - Linear)      Right Left   Dist [place] 20/20 -1 CF at 3'   Dist ph [place]  20/300       Tonometry (Tonopen, 8:48 AM)      Right Left   Pressure 17 08       Pupils      Dark Light Shape React APD   Right 3 2.5 Round Minimal None   Left 8 8 Round Minimal  None       Extraocular Movement      Right Left    Full Full       Neuro/Psych    Oriented x3:  Yes   Mood/Affect:  Normal       Dilation    Both eyes:  1.0% Mydriacyl, 2.5% Phenylephrine @ 8:48 AM        Slit Lamp and Fundus Exam    External Exam      Right Left   External Normal Periorbital edema improving       Slit Lamp Exam      Right Left   Lids/Lashes Dermatochalasis - upper lid Dermatochalasis - upper lid   Conjunctiva/Sclera White and quiet Sutures intact; Chemosis - resolved; mild [place] and erythema;     Cornea Clear 1+ K edema with descemet folds; 2+ PEE; no epi defect   Anterior Chamber Deep and quiet Deep   Iris Round and dilated Round and dilated - dilation 35m   Lens Three piece Posterior chamber intraocular lens Three piece Posterior chamber intraocular lens in good position, 1+ condensations   Vitreous Vitreous syneresis Post vitrectomy with good silicone oil fill;        Fundus Exam      Right Left   Disc  Normal   C/D Ratio  0.45   Macula  attached under oil   Vessels  Vascular attenuation - mild   Periphery  moderate buckle height; temporal/inf retinectomy from 2-630 with peripheral edge attachd with good laser at the edge; mild heme sup temp edge          IMAGING AND PROCEDURES  Imaging and Procedures for 04/08/17  OCT, Retina - OU - Both Eyes     Right Eye Quality was good. Central Foveal Thickness: 250. Progression has been stable. Findings include  normal foveal contour, no IRF, no SRF (posterior vitreous detachment).   Left Eye Quality was borderline. Central Foveal Thickness: 237. Findings include normal foveal contour, no IRF, no SRF.   Notes Images taken, stored on drive  Diagnosis / Impression:  OD: NFP, No IRF, No SRF OS: retina attached under oil  Clinical management:  See below  Abbreviations: NFP - Normal foveal profile. CME - cystoid macular edema. PED - pigment epithelial detachment. IRF - intraretinal fluid. SRF - subretinal fluid. EZ - ellipsoid zone. ERM - epiretinal membrane. ORA - outer retinal atrophy. ORT - outer retinal tubulation. SRHM - subretinal hyper-reflective material                ASSESSMENT/PLAN:    ICD-10-CM   1. 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 - inferior detachment from 2-10 oclock with HST at ~530 - fovea off, but sup nasal macula attached - by history RD likely started 4-5 wks prior to initial presentation, but fovea became involved ~Saturday, 02/13/17 - s/p SBP (42 band) + 25g PPV/PFC/EL/FAX/C3F8 OS 9.12.18 - extensive PVR along inf temp arcades with tractional redetachment inf temp noted last visit, POM1 - s/p PPV w/ membrane peel/relaxing retinectomy/PFC/SOX OS under GA, 10.15.18             - did well this week             - retina attached and in good  position             - IOP slightly low today - D/C Alphagan P             - cont   PF 6x/day OS                         Atropine BID OS                         Alphagan P BID OS-- STOP                         PSO ung qhs OS   Systane/AT OS 4-6x/day OS  - cont oral prednisone -- 80 mg x 7 days, then 40 mg x 7 days, then 20 mg x 7 days -- currently taking 20 mg daily             - can dc face down positioning; avoid laying  flat on back             - eye shield when sleeping             - post op drop and positioning instructions reviewed             - tylenol/ibuprofen for pain             - f/u 1 week  3. Pseudophakia OU - IOLs in good position OU - monitor  Ophthalmic Meds Ordered this visit:  Meds ordered this encounter  Medications  . bacitracin-polymyxin b (POLYSPORIN) ophthalmic ointment    Sig: Place into the left eye at bedtime as needed. Place a 1/2 inch ribbon of ointment into the lower eyelid.    Dispense:  3.5 g    Refill:  0       Return in about 1 week (around 04/15/2017) for POV.  There are no Patient Instructions on file for this visit.   Explained the diagnoses, plan, and follow up with the patient and they expressed understanding.  Patient expressed understanding of the importance of proper follow up care.   Gardiner Sleeper, M.D., Ph.D. Diseases & Surgery of the Retina and Vitreous [facility] 04/08/17      Abbreviations: M myopia (nearsighted); A astigmatism; H hyperopia (farsighted); P presbyopia; Mrx spectacle prescription;  CTL contact lenses; OD right eye; OS left eye; OU both eyes  XT exotropia; ET esotropia; PEK punctate epithelial keratitis; PEE punctate epithelial erosions; DES dry eye syndrome; MGD meibomian gland dysfunction; ATs artificial tears; PFAT's preservative free artificial tears; [place] nuclear sclerotic cataract; PSC posterior subcapsular cataract; ERM epi-retinal membrane; PVD posterior vitreous detachment; RD retinal detachment; DM diabetes mellitus; DR diabetic retinopathy; NPDR non-proliferative diabetic retinopathy; PDR proliferative diabetic retinopathy; CSME clinically significant macular edema; DME diabetic macular edema; dbh dot blot hemorrhages; CWS cotton wool spot; POAG primary open angle glaucoma; C/D cup-to-disc ratio; HVF humphrey visual field; GVF goldmann visual field; OCT optical coherence tomography; IOP intraocular  pressure; BRVO Branch retinal vein occlusion; CRVO central retinal vein occlusion; CRAO central retinal artery occlusion; BRAO branch retinal artery occlusion; RT retinal tear; SB scleral buckle; PPV pars plana vitrectomy; VH Vitreous hemorrhage; PRP panretinal laser photocoagulation; IVK intravitreal kenalog; VMT vitreomacular traction; MH Macular hole;  NVD neovascularization of the disc; NVE neovascularization elsewhere; AREDS age related eye disease study; ARMD age related macular degeneration; POAG primary  open angle glaucoma; EBMD epithelial/anterior basement membrane dystrophy; ACIOL anterior chamber intraocular lens; IOL intraocular lens; PCIOL posterior chamber intraocular lens; Phaco/IOL phacoemulsification with intraocular lens placement; [place] photorefractive keratectomy; LASIK laser assisted in situ keratomileusis; HTN hypertension; DM diabetes mellitus; COPD chronic obstructive pulmonary disease

## 2017-04-08 ENCOUNTER — Encounter (INDEPENDENT_AMBULATORY_CARE_PROVIDER_SITE_OTHER): Payer: Self-pay | Admitting: Ophthalmology

## 2017-04-08 ENCOUNTER — Ambulatory Visit (INDEPENDENT_AMBULATORY_CARE_PROVIDER_SITE_OTHER): Payer: Medicare Other | Admitting: Ophthalmology

## 2017-04-08 DIAGNOSIS — H3322 Serous retinal detachment, left eye: Secondary | ICD-10-CM

## 2017-04-08 DIAGNOSIS — Z961 Presence of intraocular lens: Secondary | ICD-10-CM

## 2017-04-08 DIAGNOSIS — H3522 Other non-diabetic proliferative retinopathy, left eye: Secondary | ICD-10-CM | POA: Diagnosis not present

## 2017-04-08 MED ORDER — BACITRACIN-POLYMYXIN B 500-10000 UNIT/GM OP OINT: TOPICAL_OINTMENT | Freq: Every evening | OPHTHALMIC | 0 refills | Status: DC | PRN

## 2017-04-09 ENCOUNTER — Other Ambulatory Visit: Payer: Self-pay | Admitting: Pulmonary Disease

## 2017-04-13 ENCOUNTER — Telehealth: Payer: Self-pay | Admitting: Pulmonary Disease

## 2017-04-13 NOTE — Progress Notes (Signed)
[facility] Note  04/15/2017     CHIEF COMPLAINT Patient presents for Post-op Follow-up   HISTORY OF PRESENT ILLNESS: Jerome Mays is a 76 y.o. male who presents to the clinic today for:   HPI    Post-op Follow-up    Discomfort includes none.  Negative for pain, itching, foreign body sensation, tearing, discharge and floaters.  Vision is stable.  I, the attending physician,  performed the HPI with the patient and updated documentation appropriately.          Comments    POW 3 S/P SBP (42 Band) +25g PPV/PFC /EL/fax/C56f os 02/17/17 s/p ppv W/ membrane peel/ relaxing retinectomy/PFC SOX OS under GA (03/22/17); Pt states OS is stable, reports that OS VA remains the same; Pt states he is not seeing any new floaters or flashes; pt state that he is using PF 6x/day OS, atropine BID OS, PSO ung qhs OS, AT OS BID-TID; Pt states that he has taken his last oral pred dose today;        Last edited by ZBernarda Caffey MD on 04/15/2017  8:33 AM. (History)      Referring physician: ABurnard Bunting MD [address] [place] [id number] HISTORICAL INFORMATION:   Selected notes from the MEDICAL RECORD NUMBER Initial referral from Dr. SGershon Cranefor RD OS s/p SBP (42 band) + 25g PPV/PFC/EL/FAX/C3F8 OS 9.12.18 s/p PPV w/ membrane peel/relaxing retinectomy/PFC/SOX OS under GA, 10.15.18   CURRENT MEDICATIONS: Current Outpatient Medications (Ophthalmic Drugs)  Medication Sig  . bacitracin-polymyxin b (POLYSPORIN) ophthalmic ointment Place into the left eye at bedtime as needed. Place a 1/2 inch ribbon of ointment into the lower eyelid.  .Marland Kitchenbrimonidine (ALPHAGAN) 0.2 % ophthalmic solution Place 1 drop into the left eye 2 (two) times daily.  . carboxymethylcellulose (REFRESH) 1 % ophthalmic solution Apply 1 drop to eye 3 (three) times daily.  . dorzolamide-timolol (COSOPT) 22.3-6.8 MG/ML ophthalmic solution Place 1 drop into the left eye 2 (two) times daily. (Patient  not taking: Reported on 04/08/2017)  . gatifloxacin (ZYMAXID) 0.5 % SOLN Place 1 drop into both eyes 4 (four) times daily.  .Vladimir FasterGlycol-Propyl Glycol (SYSTANE OP) Place 1 drop into both eyes as needed (for dry eyes).  . prednisoLONE acetate (PRED FORTE) 1 % ophthalmic suspension Place 1 drop into the left eye 6 (six) times daily.   No current facility-administered medications for this visit.  (Ophthalmic Drugs)   Current Outpatient Medications (Other)  Medication Sig  . ADVAIR DISKUS 250-50 MCG/DOSE AEPB INHALE 1 PUFF INTO LUNGS TWICE DAILY  . allopurinol (ZYLOPRIM) 300 MG tablet Take 300 mg by mouth daily.   .Marland Kitchenaspirin EC 81 MG tablet Take 81 mg by mouth daily.  .Marland KitchenDALIRESP 500 MCG TABS tablet TAKE 1 TABLET BY MOUTH ONCE DAILY (Patient taking differently: TAKE 500 MCG BY MOUTH ONCE DAILY)  . diphenhydramine-acetaminophen (TYLENOL PM) 25-500 MG TABS tablet Take 2 tablets by mouth at bedtime.  .Marland KitchenHYDROcodone-acetaminophen (NORCO/VICODIN) 5-325 MG tablet Take 1 tablet by mouth every 4 (four) hours as needed for moderate pain.  . Multiple Vitamin (ONE-A-DAY MENS PO) Take 1 tablet by mouth daily.  . predniSONE (DELTASONE) 20 MG tablet Take 4 tablets (80 mg total) by mouth daily.   No current facility-administered medications for this visit.  (Other)      REVIEW OF SYSTEMS: ROS    Positive for: Eyes   Negative for: Constitutional, Gastrointestinal, Neurological, Skin, Genitourinary, Musculoskeletal, HENT, Endocrine,  Cardiovascular, Respiratory, Psychiatric, Allergic/Imm, Heme/Lymph   Last edited by Alyse Low on 04/15/2017  7:59 AM. (History)       ALLERGIES Allergies  Allergen Reactions  . Lipitor [Atorvastatin] Rash and Other (See Comments)    Passed out    PAST MEDICAL HISTORY Past Medical History:  Diagnosis Date  . Anemia    low iron  . Arthritis   . Colonic polyp   . Constipation   . COPD (chronic obstructive pulmonary disease) ([place])   . GERD  (gastroesophageal reflux disease)   . Gout   . History of kidney stones   . Malignant tumor of kidney ([place])   . Pain    LOWER BACK AND LEFT HIP - HX OF PREVIOUS LUMBAR AND CERVICAL SURGERY--PT STATES HE HAS HAD NUMBNESS IN BOTH FEET SINCE HIS BACK SURGERY  . Renal calculus   . Shortness of breath    Past Surgical History:  Procedure Laterality Date  . BACK SURGERY     LUMBAR SURGERY  . CATARACT EXTRACTION Bilateral 2003   Surgeon unknown  . EYE SURGERY     BILATERAL CATARACT EXTRACTIONS  . HAND SURGERY     left  . KNEE SURGERY     right  . LAMINECTOMY     cervical  . NEPHRECTOMY     PT STATES RIGHT KIDNEY WAS REMOVED  . 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 Diabetes Mother   . COPD Brother   . COPD Sister   . Amblyopia Neg Hx   . Blindness Neg Hx   . Cataracts Neg Hx   . Glaucoma Neg Hx   . Macular degeneration Neg Hx   . Retinal detachment Neg Hx   . Retinitis pigmentosa Neg Hx     SOCIAL HISTORY Social History   Tobacco Use  . Smoking status: Former Smoker    Packs/day: 2.00    Years: 40.00    Pack years: 80.00    Types: Cigarettes, Pipe    Last attempt to quit: 06/09/1995    Years since quitting: 21.8  . Smokeless tobacco: Never Used  Substance Use Topics  . Alcohol use: No  . Drug use: No         OPHTHALMIC EXAM:  Base Eye Exam    Visual Acuity (Snellen - Linear)      Right Left   Dist [place] 20/20 CF at 5'   Dist ph [place]  20/200       Tonometry (Tonopen, 8:05 AM)      Right Left   Pressure 14 16       Pupils      Dark Light Shape React APD   Right 3 2 Round Sluggish None   Left 7  Round NR None       Neuro/Psych    Oriented x3:  Yes   Mood/Affect:  Normal       Dilation    Left eye:  1.0% Mydriacyl @ 8:05 AM        Slit Lamp and Fundus Exam    External Exam      Right Left   External Normal Periorbital edema improving       Slit Lamp Exam      Right Left    Lids/Lashes Dermatochalasis - upper lid Dermatochalasis - upper lid   Conjunctiva/Sclera White and quiet Sutures intact; Chemosis - resolved; mild [place] and erythema;  Cornea Clear 1+ K edema with descemet folds; 2+ PEE; irregular epithelium inferotemporal    Anterior Chamber Deep and quiet Deep   Iris Round and dilated Round and dilated - dilation 66m   Lens Three piece Posterior chamber intraocular lens Three piece Posterior chamber intraocular lens in good position, 1+ condensations   Vitreous Vitreous syneresis Post vitrectomy with good silicone oil fill;        Fundus Exam      Right Left   Disc  Normal   C/D Ratio  0.45   Macula  attached under oil   Vessels  Vascular attenuation - mild   Periphery  moderate buckle height; temporal/inf retinectomy from 2-630 with peripheral edge attachd with good laser at the edge; mild heme sup temp edge resolved; tr fibrosis at inf nasal corner of retinectomy -- surrounded by good laser          IMAGING AND PROCEDURES  Imaging and Procedures for 04/15/17           ASSESSMENT/PLAN:    ICD-10-CM   1. 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 - inferior detachment from 2-10 oclock with HST at ~530 - fovea off, but sup nasal macula attached - by history RD likely started 4-5 wks prior to initial presentation, but fovea became involved ~Saturday, 02/13/17 - s/p SBP (42 band) + 25g PPV/PFC/EL/FAX/C3F8 OS 9.12.18 - extensive PVR along inf temp arcades with tractional redetachment inf temp noted last visit, POM1 - s/p PPV w/ membrane peel/relaxing retinectomy/PFC/SOX OS under GA, 10.15.18             - did well this week             - retina attached and in good position -- VA improving             - IOP improved w/ stoppage of [place] P             - cont   PF 6x/day OS                          Atropine BID OS                PSO ung qhs OS   Systane/AT OS 4-6x/day OS  - cont oral prednisone -- 20 mg daily until next visit             - can dc face down positioning; avoid laying flat on back             - dc eye shield when sleeping             - post op drop and positioning instructions reviewed             - tylenol/ibuprofen for pain             - f/u 1 week   3. Pseudophakia OU - IOLs in good position OU - monitor  Ophthalmic Meds Ordered this visit:  No orders of the defined types were placed in this encounter.      Return in about 7 days (around 04/22/2017).  There are no Patient Instructions on file for this visit.   Explained the diagnoses, plan, and follow up with the patient and they expressed understanding.  Patient expressed understanding  of the importance of proper follow up care.   Gardiner Sleeper, M.D., Ph.D. Diseases & Surgery of the Retina and Vitreous [facility] 04/15/17      Abbreviations: M myopia (nearsighted); A astigmatism; H hyperopia (farsighted); P presbyopia; Mrx spectacle prescription;  CTL contact lenses; OD right eye; OS left eye; OU both eyes  XT exotropia; ET esotropia; PEK punctate epithelial keratitis; PEE punctate epithelial erosions; DES dry eye syndrome; MGD meibomian gland dysfunction; ATs artificial tears; PFAT's preservative free artificial tears; [place] nuclear sclerotic cataract; PSC posterior subcapsular cataract; ERM epi-retinal membrane; PVD posterior vitreous detachment; RD retinal detachment; DM diabetes mellitus; DR diabetic retinopathy; NPDR non-proliferative diabetic retinopathy; PDR proliferative diabetic retinopathy; CSME clinically significant macular edema; DME diabetic macular edema; dbh dot blot hemorrhages; CWS cotton wool spot; POAG primary open angle glaucoma; C/D cup-to-disc ratio; HVF humphrey visual field; GVF goldmann visual field; OCT optical coherence tomography; IOP intraocular  pressure; BRVO Branch retinal vein occlusion; CRVO central retinal vein occlusion; CRAO central retinal artery occlusion; BRAO branch retinal artery occlusion; RT retinal tear; SB scleral buckle; PPV pars plana vitrectomy; VH Vitreous hemorrhage; PRP panretinal laser photocoagulation; IVK intravitreal kenalog; VMT vitreomacular traction; MH Macular hole;  NVD neovascularization of the disc; NVE neovascularization elsewhere; AREDS age related eye disease study; ARMD age related macular degeneration; POAG primary open angle glaucoma; EBMD epithelial/anterior basement membrane dystrophy; ACIOL anterior chamber intraocular lens; IOL intraocular lens; PCIOL posterior chamber intraocular lens; Phaco/IOL phacoemulsification with intraocular lens placement; [place] photorefractive keratectomy; LASIK laser assisted in situ keratomileusis; HTN hypertension; DM diabetes mellitus; COPD chronic obstructive pulmonary disease

## 2017-04-13 NOTE — Telephone Encounter (Signed)
Spoke with patient's wife. Advised her that per PM's last OV on him, he wanted the patient to D/C his Daliresp until his next OV. She verbalized understanding. Nothing else needed at time of call.

## 2017-04-15 ENCOUNTER — Ambulatory Visit (INDEPENDENT_AMBULATORY_CARE_PROVIDER_SITE_OTHER): Payer: Medicare Other | Admitting: Ophthalmology

## 2017-04-15 ENCOUNTER — Encounter (INDEPENDENT_AMBULATORY_CARE_PROVIDER_SITE_OTHER): Payer: Self-pay | Admitting: Ophthalmology

## 2017-04-15 DIAGNOSIS — H3322 Serous retinal detachment, left eye: Secondary | ICD-10-CM

## 2017-04-15 DIAGNOSIS — H3522 Other non-diabetic proliferative retinopathy, left eye: Secondary | ICD-10-CM

## 2017-04-15 DIAGNOSIS — Z961 Presence of intraocular lens: Secondary | ICD-10-CM

## 2017-04-21 NOTE — Progress Notes (Signed)
[facility] Note  04/22/2017     CHIEF COMPLAINT Patient presents for Post-op Follow-up   HISTORY OF PRESENT ILLNESS: Jerome Mays is a 76 y.o. male who presents to the clinic today for:   HPI    Post-op Follow-up    In left eye.  Discomfort includes Negative for pain, itching, foreign body sensation, tearing, discharge, floaters and none.  Vision is stable.  I, the attending physician,  performed the HPI with the patient and updated documentation appropriately.          Comments    POW 4; S/P SBP (42 Band) +25g PPV/PFC /EL/fax/C64f os 02/17/17 s/p ppv W/ membrane peel/ relaxing retinectomy/PFC SOX OS under GA (03/22/17);  Pt states he feels that he feels he is seeing a bit better; pt states that he has pain off and on; pt reports using PF 4xday OS, atropine OS BID, PSO ung qhs/prn OS, AT OS 4-5xday       Last edited by ZBernarda Caffey MD on 04/22/2017  8:25 AM. (History)      Referring physician: ABurnard Bunting MD [address] [place] [id number] HISTORICAL INFORMATION:   [place]hon Cranefor RD OS s/p SBP (42 band) + 25g PPV/PFC/EL/FAX/C3F8 OS 9.12.18 s/p PPV w/ membrane peel/relaxing retinectomy/PFC/SOX OS under GA, 10.15.18   CURRENT MEDICATIONS: Current Outpatient Medications (Ophthalmic Drugs)  Medication Sig  . bacitracin-polymyxin b (POLYSPORIN) ophthalmic ointment Place into the left eye at bedtime as needed. Place a 1/2 inch ribbon of ointment into the lower eyelid.  .Marland Kitchenbrimonidine (ALPHAGAN) 0.2 % ophthalmic solution Place 1 drop into the left eye 2 (two) times daily.  . carboxymethylcellulose (REFRESH) 1 % ophthalmic solution Apply 1 drop to eye 3 (three) times daily.  . dorzolamide-timolol (COSOPT) 22.3-6.8 MG/ML ophthalmic solution Place 1 drop into the left eye 2 (two) times daily. (Patient not taking: Reported on 04/08/2017)  . gatifloxacin (ZYMAXID) 0.5 %  SOLN Place 1 drop into both eyes 4 (four) times daily.  .Vladimir FasterGlycol-Propyl Glycol (SYSTANE OP) Place 1 drop into both eyes as needed (for dry eyes).  . prednisoLONE acetate (PRED FORTE) 1 % ophthalmic suspension Place 1 drop into the left eye 6 (six) times daily.   No current facility-administered medications for this visit.  (Ophthalmic Drugs)   Current Outpatient Medications (Other)  Medication Sig  . ADVAIR DISKUS 250-50 MCG/DOSE AEPB INHALE 1 PUFF INTO LUNGS TWICE DAILY  . allopurinol (ZYLOPRIM) 300 MG tablet Take 300 mg by mouth daily.   .Marland Kitchenaspirin EC 81 MG tablet Take 81 mg by mouth daily.  .Marland KitchenDALIRESP 500 MCG TABS tablet TAKE 1 TABLET BY MOUTH ONCE DAILY (Patient taking differently: TAKE 500 MCG BY MOUTH ONCE DAILY)  . diphenhydramine-acetaminophen (TYLENOL PM) 25-500 MG TABS tablet Take 2 tablets by mouth at bedtime.  .Marland KitchenHYDROcodone-acetaminophen (NORCO/VICODIN) 5-325 MG tablet Take 1 tablet by mouth every 4 (four) hours as needed for moderate pain.  . Multiple Vitamin (ONE-A-DAY MENS PO) Take 1 tablet by mouth daily.  . predniSONE (DELTASONE) 20 MG tablet Take 4 tablets (80 mg total) by mouth daily.   No current facility-administered medications for this visit.  (Other)      REVIEW OF SYSTEMS: ROS    Positive for: Eyes   Negative for: Constitutional, Gastrointestinal, Neurological, Skin, Genitourinary, Musculoskeletal, HENT, Endocrine, Cardiovascular, Respiratory, Psychiatric, Allergic/Imm, Heme/Lymph   Last edited by RAlyse Lowon  04/22/2017  8:03 AM. (History)       ALLERGIES Allergies  Allergen Reactions  . Lipitor [Atorvastatin] Rash and Other (See Comments)    Passed out    PAST MEDICAL HISTORY Past Medical History:  Diagnosis Date  . Anemia    low iron  . Arthritis   . Colonic polyp   . Constipation   . COPD (chronic obstructive pulmonary disease) ([place])   . GERD (gastroesophageal reflux disease)   . Gout   . History of kidney stones   .  Malignant tumor of kidney ([place])   . Pain    LOWER BACK AND LEFT HIP - HX OF PREVIOUS LUMBAR AND CERVICAL SURGERY--PT STATES HE HAS HAD NUMBNESS IN BOTH FEET SINCE HIS BACK SURGERY  . Renal calculus   . Shortness of breath    Past Surgical History:  Procedure Laterality Date  . BACK SURGERY     LUMBAR SURGERY  . CATARACT EXTRACTION Bilateral 2003   Surgeon unknown  . EYE SURGERY     BILATERAL CATARACT EXTRACTIONS  . GIVENS CAPSULE STUDY N/A 10/07/2015   Procedure: GIVENS CAPSULE STUDY;  Surgeon: Carol Ada, MD;  Location: [facility];  Service: Endoscopy;  Laterality: N/A;  . HAND SURGERY     left  . KNEE SURGERY     right  . LAMINECTOMY     cervical  . MEMBRANE PEEL Left 03/22/2017   Procedure: POSSIBLE MEMBRANE PEEL WITH SILICONE OIL AND PERFLURON;  Surgeon: [name], MD;  Location: [facility];  Service: Ophthalmology;  Laterality: Left;  . NEPHRECTOMY     PT STATES RIGHT KIDNEY WAS REMOVED  . PARS PLANA VITRECTOMY Left 03/22/2017   Procedure: PARS PLANA VITRECTOMY WITH 25 GAUGE;  Surgeon: [name], MD;  Location: [facility];  Service: Ophthalmology;  Laterality: Left;  . 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 TOTAL KNEE ARTHROPLASTY Left 10/19/2012   Procedure: LEFT TOTAL KNEE ARTHROPLASTY;  Surgeon: Magnus Sinning, MD;  Location: [facility];  Service: Orthopedics;  Laterality: Left;  [name] VITRECTOMY 25 GAUGE WITH SCLERAL BUCKLE Left 02/17/2017   Procedure: VITRECTOMY 25 GAUGE WITH SCLERAL BUCKLE membrane peel, injection of silicone oil, and endolaser photocoagulation.;  Surgeon: [name], MD;  Location: [facility];  Service: Ophthalmology;  Laterality: Left;    FAMILY HISTORY Family History  Problem Relation Age of Onset  . Diabetes Mother   . COPD Brother   . COPD Sister   . Amblyopia Neg Hx   . Blindness Neg Hx   . Cataracts Neg Hx   . Glaucoma Neg Hx   . Macular degeneration Neg Hx   . Retinal detachment Neg Hx   . Retinitis pigmentosa Neg Hx     SOCIAL  HISTORY Social History   Tobacco Use  . Smoking status: Former Smoker    Packs/day: 2.00    Years: 40.00    Pack years: 80.00    Types: Cigarettes, Pipe    Last attempt to quit: 06/09/1995    Years since quitting: 21.8  . Smokeless tobacco: Never Used  Substance Use Topics  . Alcohol use: No  . Drug use: No         OPHTHALMIC EXAM:  Base Eye Exam    Visual Acuity (Snellen - Linear)      Right Left   Dist [place] 20/20 CF at 5'   Dist ph [place]  20/200       Tonometry (Tonopen, 8:08 AM)      Right Left  Pressure 13 13       Pupils      Dark Light Shape React APD   Right 3 2 Round Brisk None   Left 7  Round  None       Extraocular Movement      Right Left    Full Full       Neuro/Psych    Oriented x3:  Yes   Mood/Affect:  Normal       Dilation    Left eye:  1.0% Mydriacyl @ 8:08 AM        Slit Lamp and Fundus Exam    External Exam      Right Left   External Normal Periorbital edema improving       Slit Lamp Exam      Right Left   Lids/Lashes Dermatochalasis - upper lid Dermatochalasis - upper lid   Conjunctiva/Sclera White and quiet Sutures intact; Chemosis - resolved; mild [place] and erythema;     Cornea Clear 1+ K edema with descemet folds; 2+ PEE; irregular epithelium w/ bullae inferotemporal    Anterior Chamber Deep and quiet Deep   Iris Round and dilated Round and dilated - dilation 53m   Lens Three piece Posterior chamber intraocular lens Three piece Posterior chamber intraocular lens in good position, 1+ condensations   Vitreous Vitreous syneresis Post vitrectomy with good silicone oil fill;        Fundus Exam      Right Left   Disc  Normal   C/D Ratio  0.45   Macula  attached under oil   Vessels  Vascular attenuation - mild   Periphery  moderate buckle height; temporal/inf retinectomy from 2-630 with peripheral edge attachd with good laser at the edge; mild heme sup temp edge resolved; tr fibrosis at inf nasal corner of retinectomy -- surrounded  by good laser          IMAGING AND PROCEDURES  Imaging and Procedures for 04/22/17           ASSESSMENT/PLAN:    ICD-10-CM   1. 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 - inferior detachment from 2-10 oclock with HST at ~530 - fovea off, but sup nasal macula attached - by history RD likely started 4-5 wks prior to initial presentation, but fovea became involved ~Saturday, 02/13/17 - s/p SBP (42 band) + 25g PPV/PFC/EL/FAX/C3F8 OS 9.12.18 - extensive PVR along inf temp arcades with tractional redetachment inf temp noted last visit, POM1 - s/p PPV w/ membrane peel/relaxing retinectomy/PFC/SOX OS under GA, 10.15.18             - did well this week             - retina attached and in good position -- VA improving             - IOP good             - cont   PF, decrease to 4x/day OS                         Atropine BID OS-- STOP                PSO ung qhs OS    Systane/AT OS 4-6x/day OS  - cont oral prednisone -- 20 mg  daily until next visit -- STOP             - avoid laying flat on back             - post op drop and positioning instructions reviewed             - tylenol/ibuprofen for pain             - f/u 2 weeks  3. Pseudophakia OU - IOLs in good position OU - monitor  Ophthalmic Meds Ordered this visit:  No orders of the defined types were placed in this encounter.      Return in about 2 weeks (around 05/06/2017) for POV.  There are no Patient Instructions on file for this visit.   Explained the diagnoses, plan, and follow up with the patient and they expressed understanding.  Patient expressed understanding of the importance of proper follow up care.   Gardiner Sleeper, M.D., Ph.D. Diseases & Surgery of the Retina and Vitreous [facility] 04/22/17      Abbreviations: M myopia  (nearsighted); A astigmatism; H hyperopia (farsighted); P presbyopia; Mrx spectacle prescription;  CTL contact lenses; OD right eye; OS left eye; OU both eyes  XT exotropia; ET esotropia; PEK punctate epithelial keratitis; PEE punctate epithelial erosions; DES dry eye syndrome; MGD meibomian gland dysfunction; ATs artificial tears; PFAT's preservative free artificial tears; [place] nuclear sclerotic cataract; PSC posterior subcapsular cataract; ERM epi-retinal membrane; PVD posterior vitreous detachment; RD retinal detachment; DM diabetes mellitus; DR diabetic retinopathy; NPDR non-proliferative diabetic retinopathy; PDR proliferative diabetic retinopathy; CSME clinically significant macular edema; DME diabetic macular edema; dbh dot blot hemorrhages; CWS cotton wool spot; POAG primary open angle glaucoma; C/D cup-to-disc ratio; HVF humphrey visual field; GVF goldmann visual field; OCT optical coherence tomography; IOP intraocular pressure; BRVO Branch retinal vein occlusion; CRVO central retinal vein occlusion; CRAO central retinal artery occlusion; BRAO branch retinal artery occlusion; RT retinal tear; SB scleral buckle; PPV pars plana vitrectomy; VH Vitreous hemorrhage; PRP panretinal laser photocoagulation; IVK intravitreal kenalog; VMT vitreomacular traction; MH Macular hole;  NVD neovascularization of the disc; NVE neovascularization elsewhere; AREDS age related eye disease study; ARMD age related macular degeneration; POAG primary open angle glaucoma; EBMD epithelial/anterior basement membrane dystrophy; ACIOL anterior chamber intraocular lens; IOL intraocular lens; PCIOL posterior chamber intraocular lens; Phaco/IOL phacoemulsification with intraocular lens placement; [place] photorefractive keratectomy; LASIK laser assisted in situ keratomileusis; HTN hypertension; DM diabetes mellitus; COPD chronic obstructive pulmonary disease

## 2017-04-22 ENCOUNTER — Encounter (INDEPENDENT_AMBULATORY_CARE_PROVIDER_SITE_OTHER): Payer: Self-pay | Admitting: Ophthalmology

## 2017-04-22 ENCOUNTER — Ambulatory Visit (INDEPENDENT_AMBULATORY_CARE_PROVIDER_SITE_OTHER): Payer: Medicare Other | Admitting: Ophthalmology

## 2017-04-22 DIAGNOSIS — H3322 Serous retinal detachment, left eye: Secondary | ICD-10-CM

## 2017-04-22 DIAGNOSIS — H3522 Other non-diabetic proliferative retinopathy, left eye: Secondary | ICD-10-CM

## 2017-04-22 DIAGNOSIS — Z961 Presence of intraocular lens: Secondary | ICD-10-CM

## 2017-05-04 NOTE — Progress Notes (Signed)
[facility] Note  05/06/2017     CHIEF COMPLAINT Patient presents for Post-op Follow-up   HISTORY OF PRESENT ILLNESS: Jerome Mays is a 76 y.o. male who presents to the clinic today for:   HPI    Post-op Follow-up    In left eye.  Discomfort includes none.  Negative for pain, itching, foreign body sensation, tearing, discharge and floaters.  Vision is stable.  I, the attending physician,  performed the HPI with the patient and updated documentation appropriately.          Comments    /p SBP (42 band) + 25g PPV/PFC/EL/FAX/C3F8 OS 9.12.18 s/p PPV w/ membrane peel/relaxing retinectomy/PFC/SOX OS under GA, 10.15.18; Pt states OS VA is stable; Pt states that he is using gtts as directed; Pt denies floaters, denies flashes, denies wavy VA;        Last edited by [name], MD on 05/06/2017  8:34 AM. (History)      Referring physician: [name], MD [place], [place] [id number]  HISTORICAL INFORMATION:   Selected notes from the MEDICAL RECORD NUMBER Initial referral from Dr. [name] for RD OS s/p SBP (42 band) + 25g PPV/PFC/EL/FAX/C3F8 OS 9.12.18 s/p PPV w/ membrane peel/relaxing retinectomy/PFC/SOX OS under GA, 10.15.18   CURRENT MEDICATIONS: Current Outpatient Medications (Ophthalmic Drugs)  Medication Sig  . bacitracin-polymyxin b (POLYSPORIN) ophthalmic ointment Place into the left eye at bedtime as needed. Place a 1/2 inch ribbon of ointment into the lower eyelid.  [name] brimonidine (ALPHAGAN) 0.2 % ophthalmic solution Place 1 drop into the left eye 2 (two) times daily.  . carboxymethylcellulose (REFRESH) 1 % ophthalmic solution Apply 1 drop to eye 3 (three) times daily.  . dorzolamide-timolol (COSOPT) 22.3-6.8 MG/ML ophthalmic solution Place 1 drop into the left eye 2 (two) times daily. (Patient not taking: Reported on 04/08/2017)  . gatifloxacin (ZYMAXID) 0.5 % SOLN Place 1 drop into both eyes 4 (four) times daily.  Vladimir Faster Glycol-Propyl Glycol (SYSTANE OP) Place 1 drop into both eyes as needed (for dry eyes).  . prednisoLONE acetate (PRED FORTE) 1 % ophthalmic suspension Place 1 drop into the left eye 4 (four) times daily.   No current facility-administered medications for this visit.  (Ophthalmic Drugs)   Current Outpatient Medications (Other)  Medication Sig  . ADVAIR DISKUS 250-50 MCG/DOSE AEPB INHALE 1 PUFF INTO LUNGS TWICE DAILY  . allopurinol (ZYLOPRIM) 300 MG tablet Take 300 mg by mouth daily.   [name] aspirin EC 81 MG tablet Take 81 mg by mouth daily.  [name] DALIRESP 500 MCG TABS tablet TAKE 1 TABLET BY MOUTH ONCE DAILY (Patient taking differently: TAKE 500 MCG BY MOUTH ONCE DAILY)  . diphenhydramine-acetaminophen (TYLENOL PM) 25-500 MG TABS tablet Take 2 tablets by mouth at bedtime.  [name] HYDROcodone-acetaminophen (NORCO/VICODIN) 5-325 MG tablet Take 1 tablet by mouth every 4 (four) hours as needed for moderate pain.  . Multiple Vitamin (ONE-A-DAY MENS PO) Take 1 tablet by mouth daily.  . predniSONE (DELTASONE) 20 MG tablet Take 4 tablets (80 mg total) by mouth daily.   No current facility-administered medications for this visit.  (Other)      REVIEW OF SYSTEMS: ROS    Positive for: Eyes   Negative for: Constitutional, Gastrointestinal, Neurological, Skin, Genitourinary, Musculoskeletal, HENT, Endocrine, Cardiovascular, Respiratory, Psychiatric, Allergic/Imm, Heme/Lymph   Last edited by Alyse Low on 05/06/2017  8:04 AM. (History)       ALLERGIES Allergies  Allergen Reactions  .  Lipitor [Atorvastatin] Rash and Other (See Comments)    Passed out    PAST MEDICAL HISTORY Past Medical History:  Diagnosis Date  . Anemia    low iron  . Arthritis   . Colonic polyp   . Constipation   . COPD (chronic obstructive pulmonary disease) ([place])   . GERD (gastroesophageal reflux disease)   . Gout   . History of kidney stones   . Malignant tumor of kidney ([place])   . Pain    LOWER BACK AND  LEFT HIP - HX OF PREVIOUS LUMBAR AND CERVICAL SURGERY--PT STATES HE HAS HAD NUMBNESS IN BOTH FEET SINCE HIS BACK SURGERY  . Renal calculus   . Shortness of breath    Past Surgical History:  Procedure Laterality Date  . BACK SURGERY     LUMBAR SURGERY  . CATARACT EXTRACTION Bilateral 2003   Surgeon unknown  . EYE SURGERY     BILATERAL CATARACT EXTRACTIONS  . GIVENS CAPSULE STUDY N/A 10/07/2015   Procedure: GIVENS CAPSULE STUDY;  Surgeon: Carol Ada, MD;  Location: [facility];  Service: Endoscopy;  Laterality: N/A;  . HAND SURGERY     left  . KNEE SURGERY     right  . LAMINECTOMY     cervical  . MEMBRANE PEEL Left 03/22/2017   Procedure: POSSIBLE MEMBRANE PEEL WITH SILICONE OIL AND PERFLURON;  Surgeon: [name], MD;  Location: [facility];  Service: Ophthalmology;  Laterality: Left;  . NEPHRECTOMY     PT STATES RIGHT KIDNEY WAS REMOVED  . PARS PLANA VITRECTOMY Left 03/22/2017   Procedure: PARS PLANA VITRECTOMY WITH 25 GAUGE;  Surgeon: [name], MD;  Location: [facility];  Service: Ophthalmology;  Laterality: Left;  . 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 TOTAL KNEE ARTHROPLASTY Left 10/19/2012   Procedure: LEFT TOTAL KNEE ARTHROPLASTY;  Surgeon: Magnus Sinning, MD;  Location: [facility];  Service: Orthopedics;  Laterality: Left;  [name] VITRECTOMY 25 GAUGE WITH SCLERAL BUCKLE Left 02/17/2017   Procedure: VITRECTOMY 25 GAUGE WITH SCLERAL BUCKLE membrane peel, injection of silicone oil, and endolaser photocoagulation.;  Surgeon: [name], MD;  Location: [facility];  Service: Ophthalmology;  Laterality: Left;    FAMILY HISTORY Family History  Problem Relation Age of Onset  . Diabetes Mother   . COPD Brother   . COPD Sister   . Amblyopia Neg Hx   . Blindness Neg Hx   . Cataracts Neg Hx   . Glaucoma Neg Hx   . Macular degeneration Neg Hx   . Retinal detachment Neg Hx   . Retinitis pigmentosa Neg Hx     SOCIAL HISTORY Social History   Tobacco Use  . Smoking status: Former Smoker     Packs/day: 2.00    Years: 40.00    Pack years: 80.00    Types: Cigarettes, Pipe    Last attempt to quit: 06/09/1995    Years since quitting: 21.9  . Smokeless tobacco: Never Used  Substance Use Topics  . Alcohol use: No  . Drug use: No         OPHTHALMIC EXAM:  Base Eye Exam    Visual Acuity (Snellen - Linear)      Right Left   Dist [place] 20/20 20/400   Dist ph [place] NI 20/200       Tonometry (Tonopen, 8:10 AM)      Right Left   Pressure 16 13       Pupils      Dark Light Shape  React APD   Right 3 2 Round Sluggish None   Left 6  Round NR None       Extraocular Movement      Right Left    Full Full       Neuro/Psych    Oriented x3:  Yes   Mood/Affect:  Normal       Dilation    Both eyes:  1.0% Mydriacyl, 2.5% Phenylephrine @ 8:10 AM        Slit Lamp and Fundus Exam    External Exam      Right Left   External Normal Periorbital edema improving       Slit Lamp Exam      Right Left   Lids/Lashes Dermatochalasis - upper lid Dermatochalasis - upper lid   Conjunctiva/Sclera White and quiet Sutures intact; Chemosis - resolved; mild Raymond and erythema;     Cornea Clear 1+ K edema with descemet folds; 2+ PEE; irregular epithelium w/ bullae inferotemporal    Anterior Chamber Deep and quiet Deep   Iris Round and dilated Round and dilated - dilation 50m   Lens Three piece Posterior chamber intraocular lens Three piece Posterior chamber intraocular lens in good position, 1+ condensations   Vitreous Vitreous syneresis Post vitrectomy with good silicone oil fill;        Fundus Exam      Right Left   Disc Temporal and Nasal Peripapillary pigment  Normal   C/D Ratio 0.45 0.45   Macula Flat; , Retinal pigment epithelial mottling - mild attached under oil   Vessels Normal Vascular attenuation - mild   Periphery Flat; Scattered Reticular degeneration; no RT or RD moderate buckle height; temporal/inf retinectomy from 2-630 with peripheral edge attached with good laser at  the edge; mild heme sup temp edge resolved; tr fibrosis at inf nasal corner of retinectomy -- surrounded by good laser          IMAGING AND PROCEDURES  Imaging and Procedures for 05/06/17  OCT, Retina - OU - Both Eyes     Right Eye Quality was good. Central Foveal Thickness: 249. Progression has been stable. Findings include normal foveal contour, no IRF, no SRF.   Left Eye Quality was borderline. Central Foveal Thickness: 221. Progression has improved. Findings include no IRF, no SRF, abnormal foveal contour (Cystic changes, mild outer retinal thinning).   Notes Images taken, stored on drive  Diagnosis / Impression:  OD: NFP, No IRF, No SRF OS: retina attached under oil  Clinical management:  See below  Abbreviations: NFP - Normal foveal profile. CME - cystoid macular edema. PED - pigment epithelial detachment. IRF - intraretinal fluid. SRF - subretinal fluid. EZ - ellipsoid zone. ERM - epiretinal membrane. ORA - outer retinal atrophy. ORT - outer retinal tubulation. SRHM - subretinal hyper-reflective material                  ASSESSMENT/PLAN:    ICD-10-CM   1. 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 - inferior detachment from 2-10 oclock with HST at ~530 - fovea off, but sup nasal macula attached - by history RD likely started 4-5 wks prior to initial presentation, but fovea became involved ~Saturday, 02/13/17 - s/p SBP (42 band) + 25g PPV/PFC/EL/FAX/C3F8 OS 9.12.18 - extensive PVR along inf temp arcades  with tractional redetachment inf temp noted last visit, POM1 - s/p PPV w/ membrane peel/relaxing retinectomy/PFC/SOX OS under GA, 10.15.18             - doing well             - retina attached and in good position -- VA improving             - IOP good             - cont   PF 4x/day OS                 PSO ung qhs OS    Systane/AT OS 4-6x/day OS             - avoid laying flat on back             - post op drop and positioning instructions reviewed             - f/u 2 weeks  3. Pseudophakia OU - IOLs in good position OU - monitor  Ophthalmic Meds Ordered this visit:  Meds ordered this encounter  Medications  . prednisoLONE acetate (PRED FORTE) 1 % ophthalmic suspension    Sig: Place 1 drop into the left eye 4 (four) times daily.    Dispense:  15 mL    Refill:  0  . bacitracin-polymyxin b (POLYSPORIN) ophthalmic ointment    Sig: Place into the left eye at bedtime as needed. Place a 1/2 inch ribbon of ointment into the lower eyelid.    Dispense:  3.5 g    Refill:  0       Return in about 2 weeks (around 05/20/2017) for S/P SBP (42 band) + PPV/PFC/EL.  There are no Patient Instructions on file for this visit.   Explained the diagnoses, plan, and follow up with the patient and they expressed understanding.  Patient expressed understanding of the importance of proper follow up care.   Gardiner Sleeper, M.D., Ph.D. Diseases & Surgery of the Retina and Vitreous [facility] 05/06/17      Abbreviations: M myopia (nearsighted); A astigmatism; H hyperopia (farsighted); P presbyopia; Mrx spectacle prescription;  CTL contact lenses; OD right eye; OS left eye; OU both eyes  XT exotropia; ET esotropia; PEK punctate epithelial keratitis; PEE punctate epithelial erosions; DES dry eye syndrome; MGD meibomian gland dysfunction; ATs artificial tears; PFAT's preservative free artificial tears; Vaughn nuclear sclerotic cataract; PSC posterior subcapsular cataract; ERM epi-retinal membrane; PVD posterior vitreous detachment; RD retinal detachment; DM diabetes mellitus; DR diabetic retinopathy; NPDR non-proliferative diabetic retinopathy; PDR proliferative diabetic retinopathy; CSME clinically significant macular edema; DME diabetic macular edema; dbh dot blot  hemorrhages; CWS cotton wool spot; POAG primary open angle glaucoma; C/D cup-to-disc ratio; HVF humphrey visual field; GVF goldmann visual field; OCT optical coherence tomography; IOP intraocular pressure; BRVO Branch retinal vein occlusion; CRVO central retinal vein occlusion; CRAO central retinal artery occlusion; BRAO branch retinal artery occlusion; RT retinal tear; SB scleral buckle; PPV pars plana vitrectomy; VH Vitreous hemorrhage; PRP panretinal laser photocoagulation; IVK intravitreal kenalog; VMT vitreomacular traction; MH Macular hole;  NVD neovascularization of the disc; NVE neovascularization elsewhere; AREDS age related eye disease study; ARMD age related macular degeneration; POAG primary open angle glaucoma; EBMD epithelial/anterior basement membrane dystrophy; ACIOL anterior chamber intraocular lens; IOL intraocular lens; PCIOL posterior chamber intraocular lens; Phaco/IOL phacoemulsification with intraocular lens placement; PRK photorefractive keratectomy; LASIK laser assisted in situ keratomileusis; HTN hypertension;  DM diabetes mellitus; COPD chronic obstructive pulmonary disease

## 2017-05-06 ENCOUNTER — Ambulatory Visit (INDEPENDENT_AMBULATORY_CARE_PROVIDER_SITE_OTHER): Payer: Medicare Other | Admitting: Ophthalmology

## 2017-05-06 ENCOUNTER — Encounter (INDEPENDENT_AMBULATORY_CARE_PROVIDER_SITE_OTHER): Payer: Self-pay | Admitting: Ophthalmology

## 2017-05-06 DIAGNOSIS — H3522 Other non-diabetic proliferative retinopathy, left eye: Secondary | ICD-10-CM

## 2017-05-06 DIAGNOSIS — H3322 Serous retinal detachment, left eye: Secondary | ICD-10-CM

## 2017-05-06 DIAGNOSIS — Z961 Presence of intraocular lens: Secondary | ICD-10-CM

## 2017-05-06 MED ORDER — BACITRACIN-POLYMYXIN B 500-10000 UNIT/GM OP OINT: TOPICAL_OINTMENT | Freq: Every evening | OPHTHALMIC | 0 refills | Status: DC | PRN

## 2017-05-06 MED ORDER — PREDNISOLONE ACETATE 1 % OP SUSP: 1.0000 [drp] | Freq: Four times a day (QID) | OPHTHALMIC | 0 refills | Status: DC

## 2017-05-19 NOTE — Progress Notes (Signed)
[facility]  05/20/2017     CHIEF COMPLAINT Patient presents for Post-op Follow-up   HISTORY OF PRESENT ILLNESS: Jerome Mays is a 76 y.o. male who presents to the clinic today for:   HPI    Post-op Follow-up    In left eye.  Discomfort includes none.  Negative for pain, itching, foreign body sensation, tearing, discharge and floaters.  Vision is stable.  I, the attending physician,  performed the HPI with the patient and updated documentation appropriately.          Comments    S/p SBP (42 band) + 25g PPV/PFC/EL/FAX/C3F8 OS 9.12.18 s/p PPV w/ membrane peel/relaxing retinectomy/PFC/SOX OS under GA, 10.15.18; Pt states OS VA is stable; Pt states OS is comfortable; pt reports that he is using PF OS QID, PSO ung OS qhs, AT OS TID-QID;        Last edited by [name], MD on 05/20/2017  8:20 AM. (History)      Referring physician: [name], MD [place], [place] [address]  HISTORICAL INFORMATION:   Selected notes from the MEDICAL RECORD NUMBER Initial referral from Dr. [name] for RD OS s/p SBP (42 band) + 25g PPV/PFC/EL/FAX/C3F8 OS 9.12.18 s/p PPV w/ membrane peel/relaxing retinectomy/PFC/SOX OS under GA, 10.15.18   CURRENT MEDICATIONS: Current Outpatient Medications (Ophthalmic Drugs)  Medication Sig  . bacitracin-polymyxin b (POLYSPORIN) ophthalmic ointment Place into the left eye at bedtime as needed. Place a 1/2 inch ribbon of ointment into the lower eyelid.  [name] brimonidine (ALPHAGAN) 0.2 % ophthalmic solution Place 1 drop into the left eye 2 (two) times daily.  . carboxymethylcellulose (REFRESH) 1 % ophthalmic solution Apply 1 drop to eye 3 (three) times daily.  . dorzolamide-timolol (COSOPT) 22.3-6.8 MG/ML ophthalmic solution Place 1 drop into the left eye 2 (two) times daily. (Patient not taking: Reported on 04/08/2017)  . gatifloxacin (ZYMAXID) 0.5 % SOLN Place 1 drop into both eyes 4 (four) times daily.  Vladimir Faster Glycol-Propyl Glycol (SYSTANE OP) Place 1 drop into both eyes as needed (for dry eyes).  . prednisoLONE acetate (PRED FORTE) 1 % ophthalmic suspension Place 1 drop into the left eye 4 (four) times daily.   No current facility-administered medications for this visit.  (Ophthalmic Drugs)   Current Outpatient Medications (Other)  Medication Sig  . ADVAIR DISKUS 250-50 MCG/DOSE AEPB INHALE 1 PUFF INTO LUNGS TWICE DAILY  . allopurinol (ZYLOPRIM) 300 MG tablet Take 300 mg by mouth daily.   [name] aspirin EC 81 MG tablet Take 81 mg by mouth daily.  [name] DALIRESP 500 MCG TABS tablet TAKE 1 TABLET BY MOUTH ONCE DAILY (Patient taking differently: TAKE 500 MCG BY MOUTH ONCE DAILY)  . diphenhydramine-acetaminophen (TYLENOL PM) 25-500 MG TABS tablet Take 2 tablets by mouth at bedtime.  [name] HYDROcodone-acetaminophen (NORCO/VICODIN) 5-325 MG tablet Take 1 tablet by mouth every 4 (four) hours as needed for moderate pain.  . Multiple Vitamin (ONE-A-DAY MENS PO) Take 1 tablet by mouth daily.  . predniSONE (DELTASONE) 20 MG tablet Take 4 tablets (80 mg total) by mouth daily.   No current facility-administered medications for this visit.  (Other)      REVIEW OF SYSTEMS: ROS    Negative for: Constitutional, Gastrointestinal, Neurological, Skin, Genitourinary, Musculoskeletal, HENT, Endocrine, Cardiovascular, Eyes, Respiratory, Psychiatric, Allergic/Imm, Heme/Lymph   Last edited by Cherrie Gauze on 05/20/2017  8:00 AM. (History)       ALLERGIES Allergies  Allergen Reactions  .  Lipitor [Atorvastatin] Rash and Other (See Comments)    Passed out    PAST MEDICAL HISTORY Past Medical History:  Diagnosis Date  . Anemia    low iron  . Arthritis   . Colonic polyp   . Constipation   . COPD (chronic obstructive pulmonary disease) ([place])   . GERD (gastroesophageal reflux disease)   . Gout   . History of kidney stones   . Malignant tumor of kidney ([place])   . Pain    LOWER BACK AND LEFT HIP - HX OF  PREVIOUS LUMBAR AND CERVICAL SURGERY--PT STATES HE HAS HAD NUMBNESS IN BOTH FEET SINCE HIS BACK SURGERY  . Renal calculus   . Shortness of breath    Past Surgical History:  Procedure Laterality Date  . BACK SURGERY     LUMBAR SURGERY  . CATARACT EXTRACTION Bilateral 2003   Surgeon unknown  . EYE SURGERY     BILATERAL CATARACT EXTRACTIONS  . GIVENS CAPSULE STUDY N/A 10/07/2015   Procedure: GIVENS CAPSULE STUDY;  Surgeon: Carol Ada, MD;  Location: [facility];  Service: Endoscopy;  Laterality: N/A;  . HAND SURGERY     left  . KNEE SURGERY     right  . LAMINECTOMY     cervical  . MEMBRANE PEEL Left 03/22/2017   Procedure: POSSIBLE MEMBRANE PEEL WITH SILICONE OIL AND PERFLURON;  Surgeon: [name], MD;  Location: [facility];  Service: Ophthalmology;  Laterality: Left;  . NEPHRECTOMY     PT STATES RIGHT KIDNEY WAS REMOVED  . PARS PLANA VITRECTOMY Left 03/22/2017   Procedure: PARS PLANA VITRECTOMY WITH 25 GAUGE;  Surgeon: [name], MD;  Location: [facility];  Service: Ophthalmology;  Laterality: Left;  . 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 TOTAL KNEE ARTHROPLASTY Left 10/19/2012   Procedure: LEFT TOTAL KNEE ARTHROPLASTY;  Surgeon: Magnus Sinning, MD;  [facility];  Service: Orthopedics;  Laterality: Left;  [name] VITRECTOMY 25 GAUGE WITH SCLERAL BUCKLE Left 02/17/2017   Procedure: VITRECTOMY 25 GAUGE WITH SCLERAL BUCKLE membrane peel, injection of silicone oil, and endolaser photocoagulation.;  Surgeon: [name], MD;  Location: [facility];  Service: Ophthalmology;  Laterality: Left;    FAMILY HISTORY Family History  Problem Relation Age of Onset  . Diabetes Mother   . COPD Brother   . COPD Sister   . Amblyopia Neg Hx   . Blindness Neg Hx   . Cataracts Neg Hx   . Glaucoma Neg Hx   . Macular degeneration Neg Hx   . Retinal detachment Neg Hx   . Retinitis pigmentosa Neg Hx     SOCIAL HISTORY Social History   Tobacco Use  . Smoking status: Former Smoker    Packs/day:  2.00    Years: 40.00    Pack years: 80.00    Types: Cigarettes, Pipe    Last attempt to quit: 06/09/1995    Years since quitting: 21.9  . Smokeless tobacco: Never Used  Substance Use Topics  . Alcohol use: No  . Drug use: No         OPHTHALMIC EXAM:  Base Eye Exam    Visual Acuity (Snellen - Linear)      Right Left   Dist [place] 20/20 20/400   Dist ph [place] NI 20/200 +2       Tonometry (Tonopen, 8:15 AM)      Right Left   Pressure 14 17       Pupils      Dark Shape  APD   Right 2 Round None   Left 6 Round None       Neuro/Psych    Oriented x3:  Yes   Mood/Affect:  Normal        Slit Lamp and Fundus Exam    External Exam      Right Left   External Normal Periorbital edema improving       Slit Lamp Exam      Right Left   Lids/Lashes Dermatochalasis - upper lid Dermatochalasis - upper lid   Conjunctiva/Sclera White and quiet Chemosis resolved; conj retraction nasal   Cornea Clear 1+ K edema with descemet folds; 2+ PEE; irregular epithelium w/ bullae inferotemporal    Anterior Chamber Deep and quiet Deep   Iris Round and dilated Round and dilated - dilation 24m   Lens Three piece Posterior chamber intraocular lens Three piece Posterior chamber intraocular lens in good position, 1+ condensations   Vitreous Vitreous syneresis Post vitrectomy with good silicone oil fill;        Fundus Exam      Right Left   Disc  Normal   C/D Ratio 0.45 0.45   Macula  attached under oil   Vessels  Vascular attenuation - mild   Periphery  moderate buckle height; temporal/inf retinectomy from 2-630 with peripheral edge attached with good laser at the edge; mild heme sup temp edge resolved; tr fibrosis at inf nasal corner of retinectomy -- surrounded by good laser          IMAGING AND PROCEDURES  Imaging and Procedures for 05/20/17           ASSESSMENT/PLAN:    ICD-10-CM   1. 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 - inferior detachment from 2-10 oclock with HST at ~530 - fovea off, but sup nasal macula attached - by history RD likely started 4-5 wks prior to initial presentation, but fovea became involved ~Saturday, 02/13/17 - s/p SBP (42 band) + 25g PPV/PFC/EL/FAX/C3F8 OS 9.12.18 - extensive PVR along inf temp arcades with tractional redetachment inf temp noted last visit, POM1 - s/p PPV w/ membrane peel/relaxing retinectomy/PFC/SOX OS under GA, 10.15.18             - doing well             - retina attached and in good position -- VA stable today             - IOP good             - cont   PF 4x/day OS                PSO ung qhs OS    Systane/AT OS 4-6x/day OS             - avoid laying flat on back             - post op drop and positioning instructions reviewed             - f/u 3 weeks -- dilate OU  3. Pseudophakia OU - IOLs in good position OU - monitor  Ophthalmic Meds Ordered this visit:  No orders of the defined types were placed in this encounter.      Return in about 3 weeks (around 06/10/2017) for S/P SBP (42 band) +25g PPV/PFC/EL/FAX/C3F8  OS ; S/P PPV w/ membrane pee/relaxing retinectomy/PFC/SOX.  There are no Patient Instructions on file for this visit.   Explained the diagnoses, plan, and follow up with the patient and they expressed understanding.  Patient expressed understanding of the importance of proper follow up care.   This document serves as a record of services personally performed by Gardiner Sleeper, MD, PhD. It was created on their behalf by Catha Brow, [place], a certified ophthalmic assistant. The creation of this record is the provider's dictation and/or activities during the visit.  Electronically signed by: Catha Brow, [place]  05/20/17 8:31 AM    Gardiner Sleeper, M.D., Ph.D. Diseases & Surgery of the Retina and [place] 05/20/17   I have reviewed the above documentation for accuracy and completeness, and I agree with the above. Gardiner Sleeper, M.D., Ph.D. 05/20/17 8:31 AM     Abbreviations: M myopia (nearsighted); A astigmatism; H hyperopia (farsighted); P presbyopia; Mrx spectacle prescription;  CTL contact lenses; OD right eye; OS left eye; OU both eyes  XT exotropia; ET esotropia; PEK punctate epithelial keratitis; PEE punctate epithelial erosions; DES dry eye syndrome; MGD meibomian gland dysfunction; ATs artificial tears; PFAT's preservative free artificial tears; [place] nuclear sclerotic cataract; PSC posterior subcapsular cataract; ERM epi-retinal membrane; PVD posterior vitreous detachment; RD retinal detachment; DM diabetes mellitus; DR diabetic retinopathy; NPDR non-proliferative diabetic retinopathy; PDR proliferative diabetic retinopathy; CSME clinically significant macular edema; DME diabetic macular edema; dbh dot blot hemorrhages; CWS cotton wool spot; POAG primary open angle glaucoma; C/D cup-to-disc ratio; HVF humphrey visual field; GVF goldmann visual field; OCT optical coherence tomography; IOP intraocular pressure; BRVO Branch retinal vein occlusion; CRVO central retinal vein occlusion; CRAO central retinal artery occlusion; BRAO branch retinal artery occlusion; RT retinal tear; SB scleral buckle; PPV pars plana vitrectomy; VH Vitreous hemorrhage; PRP panretinal laser photocoagulation; IVK intravitreal kenalog; VMT vitreomacular traction; MH Macular hole;  NVD neovascularization of the disc; NVE neovascularization elsewhere; AREDS age related eye disease study; ARMD age related macular degeneration; POAG primary open angle glaucoma; EBMD epithelial/anterior basement membrane dystrophy; ACIOL anterior chamber intraocular lens; IOL intraocular lens; PCIOL posterior chamber intraocular lens; Phaco/IOL phacoemulsification with intraocular lens placement; [place] photorefractive keratectomy; LASIK laser  assisted in situ keratomileusis; HTN hypertension; DM diabetes mellitus; COPD chronic obstructive pulmonary disease

## 2017-05-20 ENCOUNTER — Encounter (INDEPENDENT_AMBULATORY_CARE_PROVIDER_SITE_OTHER): Payer: Self-pay | Admitting: Ophthalmology

## 2017-05-20 ENCOUNTER — Ambulatory Visit (INDEPENDENT_AMBULATORY_CARE_PROVIDER_SITE_OTHER): Payer: Medicare Other | Admitting: Ophthalmology

## 2017-05-20 DIAGNOSIS — H3322 Serous retinal detachment, left eye: Secondary | ICD-10-CM

## 2017-05-20 DIAGNOSIS — H3522 Other non-diabetic proliferative retinopathy, left eye: Secondary | ICD-10-CM

## 2017-05-20 DIAGNOSIS — Z961 Presence of intraocular lens: Secondary | ICD-10-CM

## 2017-05-21 ENCOUNTER — Other Ambulatory Visit (INDEPENDENT_AMBULATORY_CARE_PROVIDER_SITE_OTHER): Payer: Medicare Other

## 2017-05-21 ENCOUNTER — Ambulatory Visit (INDEPENDENT_AMBULATORY_CARE_PROVIDER_SITE_OTHER): Payer: Medicare Other | Admitting: Pulmonary Disease

## 2017-05-21 ENCOUNTER — Encounter: Payer: Self-pay | Admitting: Pulmonary Disease

## 2017-05-21 VITALS — BP 124/76 | HR 78 | Ht 73.0 in | Wt 190.0 lb

## 2017-05-21 DIAGNOSIS — R911 Solitary pulmonary nodule: Secondary | ICD-10-CM

## 2017-05-21 DIAGNOSIS — J449 Chronic obstructive pulmonary disease, unspecified: Secondary | ICD-10-CM | POA: Diagnosis not present

## 2017-05-21 LAB — CBC WITH DIFFERENTIAL/PLATELET
BASOS PCT: 0.7 % (ref 0.0–3.0)
Basophils Absolute: 0.1 10*3/uL (ref 0.0–0.1)
EOS PCT: 3.7 % (ref 0.0–5.0)
Eosinophils Absolute: 0.3 10*3/uL (ref 0.0–0.7)
HEMATOCRIT: 41.1 % (ref 39.0–52.0)
Hemoglobin: 13.9 g/dL (ref 13.0–17.0)
LYMPHS ABS: 1.9 10*3/uL (ref 0.7–4.0)
LYMPHS PCT: 25.8 % (ref 12.0–46.0)
MCHC: 33.8 g/dL (ref 30.0–36.0)
MCV: 94.4 fl (ref 78.0–100.0)
MONOS PCT: 9.3 % (ref 3.0–12.0)
Monocytes Absolute: 0.7 10*3/uL (ref 0.1–1.0)
NEUTROS ABS: 4.3 10*3/uL (ref 1.4–7.7)
NEUTROS PCT: 60.5 % (ref 43.0–77.0)
PLATELETS: 201 10*3/uL (ref 150.0–400.0)
RBC: 4.35 Mil/uL (ref 4.22–5.81)
RDW: 16 % — ABNORMAL HIGH (ref 11.5–15.5)
WBC: 7.2 10*3/uL (ref 4.0–10.5)

## 2017-05-21 MED ORDER — TIOTROPIUM BROMIDE-OLODATEROL 2.5-2.5 MCG/ACT IN AERS: 2.0000 | INHALATION_SPRAY | Freq: Every day | RESPIRATORY_TRACT | 0 refills | Status: AC

## 2017-05-21 NOTE — Progress Notes (Addendum)
Jerome Mays    [id number]    05-Jun-1941  Primary Care Physician:Aronson, Delfino Lovett, MD  Referring Physician: [name], MD [address] [place], [place] [address]  Chief complaint: Follow-up for COPD GOLD A  HPI: Jerome Mays is a 76 Y/O with Gold stage A COPD (CAT score 8, no exacerbations). He's been maintained on Advair and Daliresp. He's had frequent exacerbations in the past but these have improved after the Daliresp was added.   He has had symptoms of cough, chest tightness, congestion for the past week after he was exposed to pollen and dust while mowing hay. He denies any fevers, chills, purulent sputum. He feels that the Daliresp is affecting his sleep and he is taking zzquil at night to fall asleep. He had an evaluation in January 2018 for minor hemoptysis. He had a CT scan of the chest which did not show any obvious source of hemoptysis.   Interim History: He was taken of Daliresp at last visit she could not afford the cost.  He states that his breathing is slightly worse with increased cough and chest congestion.  Denies any wheezing, fevers, chills.  Outpatient Encounter Medications as of 05/21/2017  Medication Sig  . ADVAIR DISKUS 250-50 MCG/DOSE AEPB INHALE 1 PUFF INTO LUNGS TWICE DAILY  . allopurinol (ZYLOPRIM) 300 MG tablet Take 300 mg by mouth daily.   [name] aspirin EC 81 MG tablet Take 81 mg by mouth daily.  . diphenhydramine-acetaminophen (TYLENOL PM) 25-500 MG TABS tablet Take 2 tablets by mouth at bedtime.  Vladimir Faster Glycol-Propyl Glycol (SYSTANE OP) Place 1 drop into both eyes as needed (for dry eyes).  . bacitracin-polymyxin b (POLYSPORIN) ophthalmic ointment   . DALIRESP 500 MCG TABS tablet TAKE 1 TABLET BY MOUTH ONCE DAILY (Patient not taking: Reported on 05/21/2017)  . Multiple Vitamin (ONE-A-DAY MENS PO) Take 1 tablet by mouth daily.  . [DISCONTINUED] bacitracin-polymyxin b (POLYSPORIN) ophthalmic ointment Place into the left eye at bedtime  as needed. Place a 1/2 inch ribbon of ointment into the lower eyelid.  . [DISCONTINUED] brimonidine (ALPHAGAN) 0.2 % ophthalmic solution Place 1 drop into the left eye 2 (two) times daily.  . [DISCONTINUED] carboxymethylcellulose (REFRESH) 1 % ophthalmic solution Apply 1 drop to eye 3 (three) times daily.  . [DISCONTINUED] dorzolamide-timolol (COSOPT) 22.3-6.8 MG/ML ophthalmic solution Place 1 drop into the left eye 2 (two) times daily. (Patient not taking: Reported on 04/08/2017)  . [DISCONTINUED] gatifloxacin (ZYMAXID) 0.5 % SOLN Place 1 drop into both eyes 4 (four) times daily.  . [DISCONTINUED] HYDROcodone-acetaminophen (NORCO/VICODIN) 5-325 MG tablet Take 1 tablet by mouth every 4 (four) hours as needed for moderate pain.  . [DISCONTINUED] prednisoLONE acetate (PRED FORTE) 1 % ophthalmic suspension Place 1 drop into the left eye 4 (four) times daily.  . [DISCONTINUED] predniSONE (DELTASONE) 20 MG tablet Take 4 tablets (80 mg total) by mouth daily.   No facility-administered encounter medications on file as of 05/21/2017.     Allergies as of 05/21/2017 - Review Complete 05/21/2017  Allergen Reaction Noted  . Lipitor [atorvastatin] Rash and Other (See Comments)      Past Medical History:  Diagnosis Date  . Anemia    low iron  . Arthritis   . Colonic polyp   . Constipation   . COPD (chronic obstructive pulmonary disease) ([place])   . GERD (gastroesophageal reflux disease)   . Gout   . History of kidney stones   . Malignant tumor  of kidney ([facility])   . Pain    LOWER BACK AND LEFT HIP - HX OF PREVIOUS LUMBAR AND CERVICAL SURGERY--PT STATES HE HAS HAD NUMBNESS IN BOTH FEET SINCE HIS BACK SURGERY  . Renal calculus   . Shortness of breath     Past Surgical History:  Procedure Laterality Date  . BACK SURGERY     LUMBAR SURGERY  . CATARACT EXTRACTION Bilateral 2003   Surgeon unknown  . EYE SURGERY     BILATERAL CATARACT EXTRACTIONS  . GIVENS CAPSULE STUDY N/A 10/07/2015   Procedure:  GIVENS CAPSULE STUDY;  Surgeon: Carol Ada, MD;  Location: [facility];  Service: Endoscopy;  Laterality: N/A;  . HAND SURGERY     left  . KNEE SURGERY     right  . LAMINECTOMY     cervical  . MEMBRANE PEEL Left 03/22/2017   Procedure: POSSIBLE MEMBRANE PEEL WITH SILICONE OIL AND PERFLURON;  Surgeon: [name], MD;  Location: [facility];  Service: Ophthalmology;  Laterality: Left;  . NEPHRECTOMY     PT STATES RIGHT KIDNEY WAS REMOVED  . PARS PLANA VITRECTOMY Left 03/22/2017   Procedure: PARS PLANA VITRECTOMY WITH 25 GAUGE;  Surgeon: [name], MD;  Location: [facility];  Service: Ophthalmology;  Laterality: Left;  . 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 TOTAL KNEE ARTHROPLASTY Left 10/19/2012   Procedure: LEFT TOTAL KNEE ARTHROPLASTY;  Surgeon: Magnus Sinning, MD;  [facility];  Service: Orthopedics;  Laterality: Left;  [name] VITRECTOMY 25 GAUGE WITH SCLERAL BUCKLE Left 02/17/2017   Procedure: VITRECTOMY 25 GAUGE WITH SCLERAL BUCKLE membrane peel, injection of silicone oil, and endolaser photocoagulation.;  Surgeon: [name], MD;  Location: [facility];  Service: Ophthalmology;  Laterality: Left;    Family History  Problem Relation Age of Onset  . Diabetes Mother   . COPD Brother   . COPD Sister   . Amblyopia Neg Hx   . Blindness Neg Hx   . Cataracts Neg Hx   . Glaucoma Neg Hx   . Macular degeneration Neg Hx   . Retinal detachment Neg Hx   . Retinitis pigmentosa Neg Hx     Social History   Socioeconomic History  . Marital status: Married    Spouse name: Not on file  . Number of children: Not on file  . Years of education: Not on file  . Highest education level: Not on file  Social Needs  . Financial resource strain: Not on file  . Food insecurity - worry: Not on file  . Food insecurity - inability: Not on file  . Transportation needs - medical: Not on file  . Transportation needs - non-medical: Not on file  Occupational History  . Not on file  Tobacco Use  .  Smoking status: Former Smoker    Packs/day: 2.00    Years: 40.00    Pack years: 80.00    Types: Cigarettes, Pipe    Last attempt to quit: 06/09/1995    Years since quitting: 21.9  . Smokeless tobacco: Never Used  Substance and Sexual Activity  . Alcohol use: No  . Drug use: No  . Sexual activity: Not on file  Other Topics Concern  . Not on file  Social History Narrative  . Not on file   Review of systems: Review of Systems  Constitutional: Negative for fever and chills.  HENT: Negative.   Eyes: Negative for blurred vision.  Respiratory: as per HPI  Cardiovascular: Negative for chest pain and  palpitations.  Gastrointestinal: Negative for vomiting, diarrhea, blood per rectum. Genitourinary: Negative for dysuria, urgency, frequency and hematuria.  Musculoskeletal: Negative for myalgias, back pain and joint pain.  Skin: Negative for itching and rash.  Neurological: Negative for dizziness, tremors, focal weakness, seizures and loss of consciousness.  Endo/Heme/Allergies: Negative for environmental allergies.  Psychiatric/Behavioral: Negative for depression, suicidal ideas and hallucinations.  All other systems reviewed and are negative.  Physical Exam: Blood pressure 124/76, pulse 78, height 6\' 1"  (1.854 m), weight 190 lb (86.2 kg), SpO2 93 %. Gen:      No acute distress HEENT:  EOMI, sclera anicteric Neck:     No masses; no thyromegaly Lungs:    Clear to auscultation bilaterally; normal respiratory effort CV:         Regular rate and rhythm; no murmurs Abd:      + bowel sounds; soft, non-tender; no palpable masses, no distension Ext:    No edema; adequate peripheral perfusion Skin:      Warm and dry; no rash Neuro: alert and oriented x 3 Psych: normal mood and affect  Data Reviewed: CT scan 10/21/09 Moderate centrilobar emphysema. No pulmonary masses, opacities, asbestos disease or consolidation. Stable enlarged. Stable enlarged mediastinal and bilateral hilar lymph nodes  unchanged for past 3.5 years compatible with benign or reactive process.  CT scan 06/19/16-emphysema, no consolidation or lung opacity. Mediastinal and bilateral hilar lymphadenopathy stable in size. Clustered left upper lobe nodules. I have reviewed all images personally  PFTs 02/04/16 FVC 4.05 [9%), FEV1 2.40 (70%), F/F 59, TLC 101%, DLCO 35% Moderate obstructive defect with severe reduction in diffusion capacity.  Assessment:  Gold stage A COPD. Symptoms are worsened after he was taken off Daliresp. He could not afford the daliresp due to cost involved.  He continues on the Advair.  I do not believe he requires inhaled steroids as suspicion for asthma slow.  We will switch him to LAMA/LABA combination with Stiolto Check CBC differential and blood allergy profile to make sure there are no allergic or eosinophilic inflammation. There were no desaturations on exertion today.  Minor hemoptysis, in early 2018 Subcm pulm nodules He has no recurrence of hemoptysis and no obvious source on CT scan. He has new tiny left upper lobe nodules which will be followed up with repeat CT in 1 year (due in jan 2019)  Plan/Recommendations: - Stop advair. Start stiolto. Continue off daliresp. - CT without contrast follow up - CBC with diff, blood allergy profile  Marshell Garfinkel MD [facility] Pager [phone] 05/21/2017, 9:16 AM  CC: [name], MD

## 2017-05-21 NOTE — Patient Instructions (Addendum)
We will stop the Advair and start you on stiolto We will check CBC with differential, blood allergy profile and Alpha 1 AT levels He will need a follow-up CT scan without contrast in 1 month's time. Follow-up in clinic in 3 months.

## 2017-05-29 LAB — RESPIRATORY ALLERGY PROFILE REGION II ~~LOC~~
Allergen, Cottonwood, t14: <0.1 kU/L
Allergen, Mouse Urine Protein, e78: <0.1 kU/L
Allergen, Mulberry, t76: <0.1 kU/L
Allergen, Oak,t7: <0.1 kU/L
Aspergillus fumigatus, m3: <0.1 kU/L
Bermuda Grass: <0.1 kU/L
CLASS: 0
CLASS: 0
CLASS: 0
CLASS: 0
CLASS: 0
CLASS: 0
CLASS: 0
CLASS: 0
CLASS: 0
CLASS: 0
CLASS: 0
Class: 0
Class: 0
Class: 0
Class: 0
Class: 0
Class: 0
Class: 0
Class: 0
Class: 0
Class: 0
Class: 0
Class: 0
Class: 0
Cockroach: <0.1 kU/L
Dog Dander: <0.1 kU/L
IGE (IMMUNOGLOBULIN E), SERUM: 22 kU/L (ref <=114)
Rough Pigweed  IgE: <0.1 kU/L
Sheep Sorrel IgE: <0.1 kU/L
Timothy Grass: <0.1 kU/L

## 2017-05-29 LAB — INTERPRETATION:

## 2017-05-29 LAB — ALPHA-1 ANTITRYPSIN PHENOTYPE: A-1 Antitrypsin, Ser: 129 mg/dL (ref 83–199)

## 2017-06-04 ENCOUNTER — Telehealth (INDEPENDENT_AMBULATORY_CARE_PROVIDER_SITE_OTHER): Payer: Self-pay | Admitting: Ophthalmology

## 2017-06-04 MED ORDER — BACITRACIN-POLYMYXIN B 500-10000 UNIT/GM OP OINT: TOPICAL_OINTMENT | Freq: Every evening | OPHTHALMIC | 3 refills | Status: DC | PRN

## 2017-06-04 NOTE — Telephone Encounter (Signed)
Pt's wife called in and left message requesting refill of polysporin ophthalmic ointment to [facility] in [place]. Prescription e-scribed as requested.  Gardiner Sleeper, M.D., Ph.D. Diseases & Surgery of the Retina and [place] 06/04/17

## 2017-06-07 NOTE — Progress Notes (Signed)
[facility] Note  06/10/2017     CHIEF COMPLAINT Patient presents for Post-op Follow-up   HISTORY OF PRESENT ILLNESS: Jerome Mays is a 76 y.o. male who presents to the clinic today for:   HPI    Post-op Follow-up    In left eye.  Discomfort includes none.  Vision is blurred at distance and is blurred at near.          Comments    S/p SBP (42 band) + 25g PPV/PFC/EL/FAX/C3F8 OS 9.12.18 s/p PPV w/ membrane peel/relaxing retinectomy/PFC/SOX OS under GA, 10.15.18; Patient states his vision" drops down when he looks out of his left eye". Pt reports he needs to "close his left eye to see clearly". Denies pain , flashes and light sensitivity. Pt uses pred forte gtt qid ,Tobradex oint _0 . Takes vits qd       Last edited by Cherrie Gauze on 06/10/2017  8:39 AM. (History)      Referring physician: [name], MD [place], [place] [id number]  HISTORICAL INFORMATION:   Selected notes from the MEDICAL RECORD NUMBER Initial referral from Dr. [name] for RD OS s/p SBP (42 band) + 25g PPV/PFC/EL/FAX/C3F8 OS 9.12.18 s/p PPV w/ membrane peel/relaxing retinectomy/PFC/SOX OS under GA, 10.15.18   CURRENT MEDICATIONS: Current Outpatient Medications (Ophthalmic Drugs)  Medication Sig  . bacitracin-polymyxin b (POLYSPORIN) ophthalmic ointment Place into the left eye at bedtime as needed.  Vladimir Faster Glycol-Propyl Glycol (SYSTANE OP) Place 1 drop into both eyes as needed (for dry eyes).   No current facility-administered medications for this visit.  (Ophthalmic Drugs)   Current Outpatient Medications (Other)  Medication Sig  . ADVAIR DISKUS 250-50 MCG/DOSE AEPB INHALE 1 PUFF INTO LUNGS TWICE DAILY  . allopurinol (ZYLOPRIM) 300 MG tablet Take 300 mg by mouth daily.   [name] aspirin EC 81 MG tablet Take 81 mg by mouth daily.  [name] DALIRESP 500 MCG TABS tablet TAKE 1 TABLET BY MOUTH ONCE DAILY  . diphenhydramine-acetaminophen (TYLENOL PM) 25-500 MG  TABS tablet Take 2 tablets by mouth at bedtime.  . Multiple Vitamin (ONE-A-DAY MENS PO) Take 1 tablet by mouth daily.   No current facility-administered medications for this visit.  (Other)      REVIEW OF SYSTEMS: ROS    Positive for: Eyes   Negative for: Constitutional, Gastrointestinal, Neurological, Skin, Genitourinary, Musculoskeletal, HENT, Endocrine, Cardiovascular, Respiratory, Psychiatric, Allergic/Imm, Heme/Lymph   Last edited by Cherrie Gauze on 06/10/2017  8:03 AM. (History)       ALLERGIES Allergies  Allergen Reactions  . Lipitor [Atorvastatin] Rash and Other (See Comments)    Passed out    PAST MEDICAL HISTORY Past Medical History:  Diagnosis Date  . Anemia    low iron  . Arthritis   . Colonic polyp   . Constipation   . COPD (chronic obstructive pulmonary disease) ([place])   . GERD (gastroesophageal reflux disease)   . Gout   . History of kidney stones   . Malignant tumor of kidney ([place])   . Pain    LOWER BACK AND LEFT HIP - HX OF PREVIOUS LUMBAR AND CERVICAL SURGERY--PT STATES HE HAS HAD NUMBNESS IN BOTH FEET SINCE HIS BACK SURGERY  . Renal calculus   . Shortness of breath    Past Surgical History:  Procedure Laterality Date  . BACK SURGERY     LUMBAR SURGERY  . CATARACT EXTRACTION Bilateral 2003   Surgeon unknown  . EYE SURGERY  BILATERAL CATARACT EXTRACTIONS  . GIVENS CAPSULE STUDY N/A 10/07/2015   Procedure: GIVENS CAPSULE STUDY;  Surgeon: Carol Ada, MD;  Location: [facility];  Service: Endoscopy;  Laterality: N/A;  . HAND SURGERY     left  . KNEE SURGERY     right  . LAMINECTOMY     cervical  . MEMBRANE PEEL Left 03/22/2017   Procedure: POSSIBLE MEMBRANE PEEL WITH SILICONE OIL AND PERFLURON;  Surgeon: [name], MD;  Location: [facility];  Service: Ophthalmology;  Laterality: Left;  . NEPHRECTOMY     PT STATES RIGHT KIDNEY WAS REMOVED  . PARS PLANA VITRECTOMY Left 03/22/2017   Procedure: PARS PLANA VITRECTOMY WITH 25 GAUGE;   Surgeon: [name], MD;  Location: [facility];  Service: Ophthalmology;  Laterality: Left;  . 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 TOTAL KNEE ARTHROPLASTY Left 10/19/2012   Procedure: LEFT TOTAL KNEE ARTHROPLASTY;  Surgeon: Magnus Sinning, MD;  Location: [facility];  Service: Orthopedics;  Laterality: Left;  [name] VITRECTOMY 25 GAUGE WITH SCLERAL BUCKLE Left 02/17/2017   Procedure: VITRECTOMY 25 GAUGE WITH SCLERAL BUCKLE membrane peel, injection of silicone oil, and endolaser photocoagulation.;  Surgeon: [name], MD;  Location: [facility];  Service: Ophthalmology;  Laterality: Left;    FAMILY HISTORY Family History  Problem Relation Age of Onset  . Diabetes Mother   . COPD Brother   . COPD Sister   . Amblyopia Neg Hx   . Blindness Neg Hx   . Cataracts Neg Hx   . Glaucoma Neg Hx   . Macular degeneration Neg Hx   . Retinal detachment Neg Hx   . Retinitis pigmentosa Neg Hx     SOCIAL HISTORY Social History   Tobacco Use  . Smoking status: Former Smoker    Packs/day: 2.00    Years: 40.00    Pack years: 80.00    Types: Cigarettes, Pipe    Last attempt to quit: 06/09/1995    Years since quitting: 22.0  . Smokeless tobacco: Never Used  Substance Use Topics  . Alcohol use: No  . Drug use: No         OPHTHALMIC EXAM:  Base Eye Exam    Visual Acuity (Snellen - Linear)      Right Left   Dist [place] 20/20 20/400   Dist ph [place] NI NI       Tonometry (Tonopen, 8:24 AM)      Right Left   Pressure 20 18       Pupils      Dark Light Shape React APD   Right 3 2 Round Sluggish None   Left 6  Round Sluggish None       Visual Fields      Left Right    Full Full       Extraocular Movement      Right Left    Full, Ortho Full, Ortho       Neuro/Psych    Oriented x3:  Yes   Mood/Affect:  Normal       Dilation    Both eyes:  1.0% Mydriacyl, 2.5% Phenylephrine @ 8:27 AM        Slit Lamp and Fundus Exam    External Exam      Right Left   External Normal Periorbital edema  improving       Slit Lamp Exam      Right Left   Lids/Lashes Dermatochalasis - upper lid Dermatochalasis - upper lid  Conjunctiva/Sclera White and quiet White and quiet   Cornea Clear Clear   Anterior Chamber Deep and quiet Deep   Iris Round and dilated Round and dilated - dilation 26m   Lens Three piece Posterior chamber intraocular lens Three piece Posterior chamber intraocular lens in good position, 1+ condensations   Vitreous Vitreous syneresis Post vitrectomy with good silicone oil fill;        Fundus Exam      Right Left   Disc  Normal   C/D Ratio 0.45 0.45   Macula  attached under oil   Vessels  Vascular attenuation - mild   Periphery  moderate buckle height; temporal/inf retinectomy from 2-630 with peripheral edge attached with good laser at the edge; trace areas of fibrosis inferiorly and at inf nasal corner of retinectomy -- surrounded by good laser          IMAGING AND PROCEDURES  Imaging and Procedures for 06/10/17           ASSESSMENT/PLAN:    ICD-10-CM   1. 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 - inferior detachment from 2-10 oclock with HST at ~530 - fovea off, but sup nasal macula attached - by history RD likely started 4-5 wks prior to initial presentation, but fovea became involved ~Saturday, 02/13/17 - s/p SBP (42 band) + 25g PPV/PFC/EL/FAX/C3F8 OS 9.12.18 - extensive PVR along inf temp arcades with tractional redetachment inf temp noted POM1 - s/p PPV w/ membrane peel/relaxing retinectomy/PFC/SOX OS under GA, 10.15.18             - doing well             - retina attached and in good position -- VA stable today  - mild focal areas of fibrosis inferiorly with good laser surrounding             - IOP good             - dec PF to BID OS             - PSO ung PRN OS   - Systane/AT OS 4-6x/day OS              - avoid laying flat on back             - post op drop and positioning instructions reviewed             - f/u 4 weeks -- dilate OU  3. Pseudophakia OU - IOLs in good position OU - monitor  Ophthalmic Meds Ordered this visit:  No orders of the defined types were placed in this encounter.      Return in about 4 weeks (around 07/08/2017) for POV.  There are no Patient Instructions on file for this visit.   Explained the diagnoses, plan, and follow up with the patient and they expressed understanding.  Patient expressed understanding of the importance of proper follow up care.   This document serves as a record of services personally performed by BGardiner Sleeper MD, PhD. It was created on their behalf by MCatha Brow C[place] Lake [place] a certified ophthalmic assistant. The creation of this record is the provider's dictation and/or activities during the visit.  Electronically signed by: MCatha Brow [place] 06/10/17 8:53 AM    BGardiner Sleeper M.D., Ph.D. Diseases & Surgery  of the Retina and [place] 06/10/17   I have reviewed the above documentation for accuracy and completeness, and I agree with the above. Gardiner Sleeper, M.D., Ph.D. 06/10/17 8:55 AM      Abbreviations: M myopia (nearsighted); A astigmatism; H hyperopia (farsighted); P presbyopia; Mrx spectacle prescription;  CTL contact lenses; OD right eye; OS left eye; OU both eyes  XT exotropia; ET esotropia; PEK punctate epithelial keratitis; PEE punctate epithelial erosions; DES dry eye syndrome; MGD meibomian gland dysfunction; ATs artificial tears; PFAT's preservative free artificial tears; [place] nuclear sclerotic cataract; PSC posterior subcapsular cataract; ERM epi-retinal membrane; PVD posterior vitreous detachment; RD retinal detachment; DM diabetes mellitus; DR diabetic retinopathy; NPDR non-proliferative diabetic retinopathy; PDR proliferative diabetic retinopathy; CSME clinically  significant macular edema; DME diabetic macular edema; dbh dot blot hemorrhages; CWS cotton wool spot; POAG primary open angle glaucoma; C/D cup-to-disc ratio; HVF humphrey visual field; GVF goldmann visual field; OCT optical coherence tomography; IOP intraocular pressure; BRVO Branch retinal vein occlusion; CRVO central retinal vein occlusion; CRAO central retinal artery occlusion; BRAO branch retinal artery occlusion; RT retinal tear; SB scleral buckle; PPV pars plana vitrectomy; VH Vitreous hemorrhage; PRP panretinal laser photocoagulation; IVK intravitreal kenalog; VMT vitreomacular traction; MH Macular hole;  NVD neovascularization of the disc; NVE neovascularization elsewhere; AREDS age related eye disease study; ARMD age related macular degeneration; POAG primary open angle glaucoma; EBMD epithelial/anterior basement membrane dystrophy; ACIOL anterior chamber intraocular lens; IOL intraocular lens; PCIOL posterior chamber intraocular lens; Phaco/IOL phacoemulsification with intraocular lens placement; [place] photorefractive keratectomy; LASIK laser assisted in situ keratomileusis; HTN hypertension; DM diabetes mellitus; COPD chronic obstructive pulmonary disease

## 2017-06-10 ENCOUNTER — Ambulatory Visit (INDEPENDENT_AMBULATORY_CARE_PROVIDER_SITE_OTHER): Payer: Medicare Other | Admitting: Ophthalmology

## 2017-06-10 ENCOUNTER — Encounter (INDEPENDENT_AMBULATORY_CARE_PROVIDER_SITE_OTHER): Payer: Self-pay | Admitting: Ophthalmology

## 2017-06-10 DIAGNOSIS — Z961 Presence of intraocular lens: Secondary | ICD-10-CM

## 2017-06-10 DIAGNOSIS — H3522 Other non-diabetic proliferative retinopathy, left eye: Secondary | ICD-10-CM

## 2017-06-10 DIAGNOSIS — H3322 Serous retinal detachment, left eye: Secondary | ICD-10-CM

## 2017-06-18 ENCOUNTER — Telehealth: Payer: Self-pay | Admitting: Pulmonary Disease

## 2017-06-18 NOTE — Telephone Encounter (Signed)
Form will be placed in PM box. They are wanting to know if pt can be switched to the alternatives they list on the paper. I will place in PM look at folder to see if he wants to switch. PM please advise.

## 2017-06-18 NOTE — Telephone Encounter (Signed)
OK. I will look

## 2017-06-21 ENCOUNTER — Telehealth: Payer: Self-pay | Admitting: Pulmonary Disease

## 2017-06-21 ENCOUNTER — Ambulatory Visit (HOSPITAL_COMMUNITY): Payer: Medicare HMO

## 2017-06-21 NOTE — Telephone Encounter (Signed)
Called and spoke with [name] who stated that the stiolto did not help with pt's symptoms. [name] stated that the pt had increased coughing as well as sneezing.   Sample of Stiolto ran out and [name] went to their pharmacy due to having one refill left of the advair, which they picked up.  Dr. Vaughan Browner, please advise if you are okay for pt to go back on the advair instead of the stiolto or if we should try a different inhaler.  Thanks!

## 2017-06-21 NOTE — Telephone Encounter (Signed)
atc pt X2, line rang to fast busy signal.  Wcb.  

## 2017-06-21 NOTE — Telephone Encounter (Signed)
Try anoro. It is covered by his formulary.

## 2017-06-22 MED ORDER — UMECLIDINIUM-VILANTEROL 62.5-25 MCG/INH IN AEPB: 1.0000 | INHALATION_SPRAY | Freq: Every day | RESPIRATORY_TRACT | 6 refills | Status: DC

## 2017-06-22 NOTE — Telephone Encounter (Signed)
Called pt letting him know that PM wanted him to try anoro since the stiolto was not working instead of going back on the advair since the anoro is covered by his insurance. Pt expressed understanding. Rx sent to pt's preferred pharmacy. Nothing further needed.

## 2017-06-29 ENCOUNTER — Other Ambulatory Visit: Payer: Self-pay

## 2017-07-01 ENCOUNTER — Ambulatory Visit (HOSPITAL_COMMUNITY)
Admission: RE | Admit: 2017-07-01 | Discharge: 2017-07-01 | Disposition: A | Discharge status: Home / Self Care | Payer: Medicare HMO | Source: Ambulatory Visit | Attending: Pulmonary Disease | Admitting: Pulmonary Disease

## 2017-07-01 DIAGNOSIS — R911 Solitary pulmonary nodule: Secondary | ICD-10-CM | POA: Diagnosis not present

## 2017-07-01 DIAGNOSIS — I700 Atherosclerosis of aorta: Secondary | ICD-10-CM | POA: Insufficient documentation

## 2017-07-01 DIAGNOSIS — R590 Localized enlarged lymph nodes: Secondary | ICD-10-CM | POA: Diagnosis not present

## 2017-07-01 DIAGNOSIS — J439 Emphysema, unspecified: Secondary | ICD-10-CM | POA: Insufficient documentation

## 2017-07-01 DIAGNOSIS — R918 Other nonspecific abnormal finding of lung field: Secondary | ICD-10-CM | POA: Diagnosis not present

## 2017-07-05 ENCOUNTER — Telehealth: Payer: Self-pay | Admitting: Pulmonary Disease

## 2017-07-05 ENCOUNTER — Other Ambulatory Visit: Payer: Self-pay | Admitting: Pulmonary Disease

## 2017-07-05 DIAGNOSIS — J849 Interstitial pulmonary disease, unspecified: Secondary | ICD-10-CM

## 2017-07-05 DIAGNOSIS — R911 Solitary pulmonary nodule: Secondary | ICD-10-CM

## 2017-07-05 NOTE — Progress Notes (Signed)
[facility] Note  07/08/2017     CHIEF COMPLAINT Patient presents for Retina Follow Up   HISTORY OF PRESENT ILLNESS: Jerome Mays is a 77 y.o. male who presents to the clinic today for:   HPI    Retina Follow Up    Patient presents with  Other.  In left eye.  Severity is moderate.  Duration of 4 weeks.  Since onset it is stable.  I, the attending physician,  performed the HPI with the patient and updated documentation appropriately.          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 (History)      Referring physician: [name], MD [place], [place] [address]  HISTORICAL INFORMATION:   Selected notes from the [facility] Initial referral from Dr. [name] for RD OS s/p SBP (42 band) + 25g PPV/PFC/EL/FAX/C3F8 OS 9.12.18 s/p PPV w/ membrane peel/relaxing retinectomy/PFC/SOX OS under GA, 10.15.18   CURRENT MEDICATIONS: Current Outpatient Medications (Ophthalmic Drugs)  Medication Sig  . bacitracin-polymyxin b (POLYSPORIN) ophthalmic ointment Place into the left eye at bedtime as needed.  Vladimir Faster Glycol-Propyl Glycol (SYSTANE OP) Place 1 drop into both eyes as needed (for dry eyes).   No current facility-administered medications for this visit.  (Ophthalmic Drugs)   Current Outpatient Medications (Other)  Medication Sig  . allopurinol (ZYLOPRIM) 300 MG tablet Take 300 mg by mouth daily.   [name] aspirin EC 81 MG tablet Take 81 mg by mouth daily.  [name] DALIRESP 500 MCG TABS tablet TAKE 1 TABLET BY MOUTH ONCE DAILY  . diphenhydramine-acetaminophen (TYLENOL PM) 25-500 MG TABS  tablet Take 2 tablets by mouth at bedtime.  . Multiple Vitamin (ONE-A-DAY MENS PO) Take 1 tablet by mouth daily.  [name] umeclidinium-vilanterol (ANORO ELLIPTA) 62.5-25 MCG/INH AEPB Inhale 1 puff into the lungs daily.   No current facility-administered medications for this visit.  (Other)      REVIEW OF SYSTEMS: ROS    Positive for: Eyes   Negative for: Constitutional, Gastrointestinal, Neurological, Skin, Genitourinary, Musculoskeletal, HENT, Endocrine, Cardiovascular, Respiratory, Psychiatric, Allergic/Imm, Heme/Lymph   Last edited by Cherrie Gauze on 07/08/2017  8:15 AM. (History)       ALLERGIES Allergies  Allergen Reactions  . Lipitor [Atorvastatin] Rash and Other (See Comments)    Passed out    PAST MEDICAL HISTORY Past Medical History:  Diagnosis Date  . Anemia    low iron  . Arthritis   . Colonic polyp   . Constipation   . COPD (chronic obstructive pulmonary disease) ([place])   . GERD (gastroesophageal reflux disease)   . Gout   . History of kidney stones   . Malignant tumor of kidney ([place])   . Pain    LOWER BACK AND LEFT HIP - HX OF PREVIOUS LUMBAR AND CERVICAL SURGERY--PT STATES HE HAS HAD NUMBNESS IN BOTH FEET SINCE HIS BACK SURGERY  . Renal calculus   . Shortness of breath    Past Surgical History:  Procedure Laterality Date  . BACK SURGERY     LUMBAR SURGERY  .  CATARACT EXTRACTION Bilateral 2003   Surgeon unknown  . EYE SURGERY     BILATERAL CATARACT EXTRACTIONS  . GIVENS CAPSULE STUDY N/A 10/07/2015   Procedure: GIVENS CAPSULE STUDY;  Surgeon: Carol Ada, MD;  Location: [place];  Service: Endoscopy;  Laterality: N/A;  . HAND SURGERY     left  . KNEE SURGERY     right  . LAMINECTOMY     cervical  . MEMBRANE PEEL Left 03/22/2017   Procedure: POSSIBLE MEMBRANE PEEL WITH SILICONE OIL AND PERFLURON;  Surgeon: [name], MD;  Location: [facility];  Service: Ophthalmology;  Laterality: Left;  . NEPHRECTOMY     PT STATES RIGHT KIDNEY WAS REMOVED  .  PARS PLANA VITRECTOMY Left 03/22/2017   Procedure: PARS PLANA VITRECTOMY WITH 25 GAUGE;  Surgeon: [name], MD;  Location: [facility];  Service: Ophthalmology;  Laterality: Left;  . 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 TOTAL KNEE ARTHROPLASTY Left 10/19/2012   Procedure: LEFT TOTAL KNEE ARTHROPLASTY;  Surgeon: Magnus Sinning, MD;  Location: [facility];  Service: Orthopedics;  Laterality: Left;  [name] VITRECTOMY 25 GAUGE WITH SCLERAL BUCKLE Left 02/17/2017   Procedure: VITRECTOMY 25 GAUGE WITH SCLERAL BUCKLE membrane peel, injection of silicone oil, and endolaser photocoagulation.;  Surgeon: [name], MD;  Location: [facility];  Service: Ophthalmology;  Laterality: Left;    FAMILY HISTORY Family History  Problem Relation Age of Onset  . Diabetes Mother   . COPD Brother   . COPD Sister   . Amblyopia Neg Hx   . Blindness Neg Hx   . Cataracts Neg Hx   . Glaucoma Neg Hx   . Macular degeneration Neg Hx   . Retinal detachment Neg Hx   . Retinitis pigmentosa Neg Hx     SOCIAL HISTORY Social History   Tobacco Use  . Smoking status: Former Smoker    Packs/day: 2.00    Years: 40.00    Pack years: 80.00    Types: Cigarettes, Pipe    Last attempt to quit: 06/09/1995    Years since quitting: 22.0  . Smokeless tobacco: Never Used  Substance Use Topics  . Alcohol use: No  . Drug use: No         OPHTHALMIC EXAM:  Base Eye Exam    Visual Acuity (Snellen - Linear)      Right Left   Dist [place] 20/20 20/800   Dist ph [place] 20/20 20/100       Tonometry (Tonopen, 8:07 AM)      Right Left   Pressure 14 15       Pupils      Dark Light Shape React APD   Right 3 2 Round Brisk None   Left 6  Round NR None       Visual Fields (Counting fingers)      Left Right     Full   Restrictions Partial outer superior temporal, inferior temporal deficiencies        Extraocular Movement      Right Left    Full, Ortho Full, Ortho       Neuro/Psych    Oriented x3:  Yes   Mood/Affect:  Normal        Dilation    Both eyes:  1.0% Mydriacyl, 2.5% Phenylephrine @ 8:07 AM        Slit Lamp and Fundus Exam    External Exam      Right Left   External Normal Periorbital edema improving  Slit Lamp Exam      Right Left   Lids/Lashes Dermatochalasis - upper lid Dermatochalasis - upper lid   Conjunctiva/Sclera White and quiet White and quiet   Cornea Clear Clear   Anterior Chamber Deep and quiet Deep, 1+ cell/pigment   Iris Round and dilated Round and dilated - dilation 12m   Lens Three piece Posterior chamber intraocular lens Three piece Posterior chamber intraocular lens in good position, 1+ condensations   Vitreous Vitreous syneresis Post vitrectomy with good silicone oil fill;        Fundus Exam      Right Left   Disc Temporal and Nasal Peripapillary pigment  Normal   C/D Ratio 0.45 0.45   Macula Flat; , Retinal pigment epithelial mottling - mild, No heme or edema attached under oil   Vessels Copper wiring Vascular attenuation - mild   Periphery Attached, Flat; Scattered Reticular degeneration; no RT or RD moderate buckle height; temporal/inf retinectomy from 2-630 with peripheral edge attached with good laser at the edge; trace areas of fibrosis inferiorly and inf temporal -- surrounded by good laser          IMAGING AND PROCEDURES  Imaging and Procedures for 07/08/17  OCT, Retina - OU - Both Eyes     Right Eye Quality was good. Central Foveal Thickness: 246. Progression has been stable. Findings include normal foveal contour, no IRF, no SRF.   Left Eye Quality was good. Central Foveal Thickness: 233. Progression has improved. Findings include no IRF, no SRF, abnormal foveal contour (Cystic changes, mild outer retinal thinning).   Notes Images taken, stored on drive  Diagnosis / Impression:  OD: NFP, No IRF, No SRF OS: retina attached under oil  Clinical management:  See below  Abbreviations: NFP - Normal foveal profile. CME - cystoid macular edema. PED -  pigment epithelial detachment. IRF - intraretinal fluid. SRF - subretinal fluid. EZ - ellipsoid zone. ERM - epiretinal membrane. ORA - outer retinal atrophy. ORT - outer retinal tubulation. SRHM - subretinal hyper-reflective material                  ASSESSMENT/PLAN:    ICD-10-CM   1. 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 Retinal edema H35.81 OCT, Retina - OU - Both Eyes    1,2. 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 - inferior detachment from 2-10 oclock with HST at ~530 - fovea off, but sup nasal macula attached - by history RD likely started 4-5 wks prior to initial presentation, but fovea became involved ~Saturday, 02/13/17 - s/p SBP (42 band) + 25g PPV/PFC/EL/FAX/C3F8 OS 9.12.18 - extensive PVR along inf temp arcades with tractional redetachment inf temp noted POM1 - s/p PPV w/ membrane peel/relaxing retinectomy/PFC/SOX OS under GA, 10.15.18             - doing well             - retina attached and in good position -- VA pinholed to 20/100 today  - mild focal areas of fibrosis inferiorly with good laser surrounding             - IOP good  - discussed possibility of removing oil - pt to consider oil removal             - cont PF BID OS             -  PSO ung PRN OS   - Systane/AT OS 4-6x/day OS             - avoid laying flat on back             - post op drop and positioning instructions reviewed             - f/u 4-6 weeks -- dilate OU  3. Pseudophakia OU - IOLs in good position OU - monitor  Ophthalmic Meds Ordered this visit:  No orders of the defined types were placed in this encounter.      Return in about 6 weeks (around 08/19/2017) for F/U mac-off inferior retinal detachment with PVR OS.  There are no Patient Instructions on file for this visit.   Explained the diagnoses, plan, and follow up with the patient and they expressed understanding.  Patient  expressed understanding of the importance of proper follow up care.   This document serves as a record of services personally performed by Gardiner Sleeper, MD, PhD. It was created on their behalf by Catha Brow, [place], a certified ophthalmic assistant. The creation of this record is the provider's dictation and/or activities during the visit.  Electronically signed by: Catha Brow, [place]  07/08/17 8:38 AM    Gardiner Sleeper, M.D., Ph.D. Diseases & Surgery of the Retina and Vitreous [facility] 07/08/17   I have reviewed the above documentation for accuracy and completeness, and I agree with the above. Gardiner Sleeper, M.D., Ph.D. 07/08/17 8:40 AM     Abbreviations: M myopia (nearsighted); A astigmatism; H hyperopia (farsighted); P presbyopia; Mrx spectacle prescription;  CTL contact lenses; OD right eye; OS left eye; OU both eyes  XT exotropia; ET esotropia; PEK punctate epithelial keratitis; PEE punctate epithelial erosions; DES dry eye syndrome; MGD meibomian gland dysfunction; ATs artificial tears; PFAT's preservative free artificial tears; [place] nuclear sclerotic cataract; PSC posterior subcapsular cataract; ERM epi-retinal membrane; PVD posterior vitreous detachment; RD retinal detachment; DM diabetes mellitus; DR diabetic retinopathy; NPDR non-proliferative diabetic retinopathy; PDR proliferative diabetic retinopathy; CSME clinically significant macular edema; DME diabetic macular edema; dbh dot blot hemorrhages; CWS cotton wool spot; POAG primary open angle glaucoma; C/D cup-to-disc ratio; HVF humphrey visual field; GVF goldmann visual field; OCT optical coherence tomography; IOP intraocular pressure; BRVO Branch retinal vein occlusion; CRVO central retinal vein occlusion; CRAO central retinal artery occlusion; BRAO branch retinal artery occlusion; RT retinal tear; SB scleral buckle; PPV pars plana vitrectomy; VH Vitreous hemorrhage; PRP panretinal laser  photocoagulation; IVK intravitreal kenalog; VMT vitreomacular traction; MH Macular hole;  NVD neovascularization of the disc; NVE neovascularization elsewhere; AREDS age related eye disease study; ARMD age related macular degeneration; POAG primary open angle glaucoma; EBMD epithelial/anterior basement membrane dystrophy; ACIOL anterior chamber intraocular lens; IOL intraocular lens; PCIOL posterior chamber intraocular lens; Phaco/IOL phacoemulsification with intraocular lens placement; [place] photorefractive keratectomy; LASIK laser assisted in situ keratomileusis; HTN hypertension; DM diabetes mellitus; COPD chronic obstructive pulmonary disease

## 2017-07-05 NOTE — Telephone Encounter (Signed)
Notes recorded by Marshell Garfinkel, MD on 07/05/2017 at 8:51 AM EST Let patient know that CT shows stable findings. Order follow up scan without contrast in 1 year       Spoke with pt and notified of results per Dr.Mannam. Pt verbalized understanding and denied any questions.

## 2017-07-08 ENCOUNTER — Ambulatory Visit (INDEPENDENT_AMBULATORY_CARE_PROVIDER_SITE_OTHER): Payer: Medicare HMO | Admitting: Ophthalmology

## 2017-07-08 ENCOUNTER — Encounter (INDEPENDENT_AMBULATORY_CARE_PROVIDER_SITE_OTHER): Payer: Self-pay | Admitting: Ophthalmology

## 2017-07-08 DIAGNOSIS — H3581 Retinal edema: Secondary | ICD-10-CM | POA: Diagnosis not present

## 2017-07-08 DIAGNOSIS — Z961 Presence of intraocular lens: Secondary | ICD-10-CM | POA: Diagnosis not present

## 2017-07-08 DIAGNOSIS — H3522 Other non-diabetic proliferative retinopathy, left eye: Secondary | ICD-10-CM

## 2017-07-08 DIAGNOSIS — H3322 Serous retinal detachment, left eye: Secondary | ICD-10-CM

## 2017-07-09 ENCOUNTER — Other Ambulatory Visit: Payer: Self-pay | Admitting: Pulmonary Disease

## 2017-07-26 ENCOUNTER — Telehealth: Payer: Self-pay | Admitting: Pulmonary Disease

## 2017-07-26 MED ORDER — PREDNISONE 10 MG PO TABS: ORAL_TABLET | ORAL | 0 refills | Status: DC

## 2017-07-26 NOTE — Telephone Encounter (Signed)
Prednisone starting at 40 mg. Reduce dose by 10 every 3 days

## 2017-07-26 NOTE — Telephone Encounter (Signed)
Called and spoke with patient, he has had congestion, cough, SOB, stuff nose x 6 days. Denies fever, body aches, and chills. Dr. Vaughan Browner please advise thank you

## 2017-07-26 NOTE — Telephone Encounter (Signed)
Patient aware, prescription sent to pharmacy.

## 2017-07-30 ENCOUNTER — Ambulatory Visit: Payer: Medicare HMO | Admitting: Pulmonary Disease

## 2017-07-30 ENCOUNTER — Encounter: Payer: Self-pay | Admitting: Pulmonary Disease

## 2017-07-30 VITALS — BP 130/80 | HR 71 | Ht 73.0 in | Wt 195.4 lb

## 2017-07-30 DIAGNOSIS — R7301 Impaired fasting glucose: Secondary | ICD-10-CM | POA: Diagnosis not present

## 2017-07-30 DIAGNOSIS — R918 Other nonspecific abnormal finding of lung field: Secondary | ICD-10-CM

## 2017-07-30 DIAGNOSIS — Z79899 Other long term (current) drug therapy: Secondary | ICD-10-CM | POA: Diagnosis not present

## 2017-07-30 DIAGNOSIS — Z125 Encounter for screening for malignant neoplasm of prostate: Secondary | ICD-10-CM | POA: Diagnosis not present

## 2017-07-30 DIAGNOSIS — M109 Gout, unspecified: Secondary | ICD-10-CM | POA: Diagnosis not present

## 2017-07-30 DIAGNOSIS — J449 Chronic obstructive pulmonary disease, unspecified: Secondary | ICD-10-CM

## 2017-07-30 DIAGNOSIS — R7989 Other specified abnormal findings of blood chemistry: Secondary | ICD-10-CM | POA: Diagnosis not present

## 2017-07-30 DIAGNOSIS — R82998 Other abnormal findings in urine: Secondary | ICD-10-CM | POA: Diagnosis not present

## 2017-07-30 MED ORDER — FLUTICASONE-UMECLIDIN-VILANT 100-62.5-25 MCG/INH IN AEPB: 1.0000 | INHALATION_SPRAY | Freq: Every day | RESPIRATORY_TRACT | 0 refills | Status: AC

## 2017-07-30 MED ORDER — FLUTICASONE-UMECLIDIN-VILANT 100-62.5-25 MCG/INH IN AEPB: 1.0000 | INHALATION_SPRAY | Freq: Every day | RESPIRATORY_TRACT | 5 refills | Status: DC

## 2017-07-30 NOTE — Patient Instructions (Signed)
I am glad that your breathing is better with the prednisone We will switch your inhalers to trelegy.  Please use this instead of either Advair or anoro Follow-up in 3 months.

## 2017-07-30 NOTE — Progress Notes (Signed)
Jerome Mays    [id number]    28-Dec-1940  Primary Care Physician:Aronson, Delfino Lovett, MD  Referring Physician: [name], MD [address] [place],  [address]  Chief complaint: Follow-up for COPD GOLD B  HPI: Mr Jerome Mays is a 77 Y/O with Gold stage B COPD (CAT score 8, 1 exacerbation). He's been maintained on Advair and Daliresp. He's had frequent exacerbations in the past but these have improved after the Daliresp was added.   He has had symptoms of cough, chest tightness, congestion for the past week after he was exposed to pollen and dust while mowing hay. He denies any fevers, chills, purulent sputum. He feels that the Daliresp is affecting his sleep and he is taking zzquil at night to fall asleep. He had an evaluation in January 2018 for minor hemoptysis. He had a CT scan of the chest which did not show any obvious source of hemoptysis.   He was previously on Daliresp.  Taken off it in 2018 as he cannot afford the cost.  Interim History: Advair was stopped at last visit and has tried on stiolto and anoro.  He felt that his dyspnea got worse.  Required a prednisone taper last week.  States that breathing is improving on the prednisone.  Still has some congestion, mild wheezing.  Denies any fevers, chills.  Outpatient Encounter Medications as of 07/30/2017  Medication Sig  . allopurinol (ZYLOPRIM) 300 MG tablet Take 300 mg by mouth daily.   [name] aspirin EC 81 MG tablet Take 81 mg by mouth daily.  . bacitracin-polymyxin b (POLYSPORIN) ophthalmic ointment Place into the left eye at bedtime as needed.  [name] DALIRESP 500 MCG TABS tablet TAKE 1 TABLET BY MOUTH ONCE DAILY  . diphenhydramine-acetaminophen (TYLENOL PM) 25-500 MG TABS tablet Take 2 tablets by mouth at bedtime.  . Multiple Vitamin (ONE-A-DAY MENS PO) Take 1 tablet by mouth daily.  Vladimir Faster Glycol-Propyl Glycol (SYSTANE OP) Place 1 drop into both eyes as needed (for dry eyes).  . predniSONE (DELTASONE) 10  MG tablet Take 4 tablets x 3 days take 3 tablets x 3 days take 2 tablets x 3 days take 1 tablet x 3 days then stop  . umeclidinium-vilanterol (ANORO ELLIPTA) 62.5-25 MCG/INH AEPB Inhale 1 puff into the lungs daily. (Patient not taking: Reported on 07/30/2017)   No facility-administered encounter medications on file as of 07/30/2017.     Allergies as of 07/30/2017 - Review Complete 07/30/2017  Allergen Reaction Noted  . Lipitor [atorvastatin] Rash and Other (See Comments)      Past Medical History:  Diagnosis Date  . Anemia    low iron  . Arthritis   . Colonic polyp   . Constipation   . COPD (chronic obstructive pulmonary disease) ([place])   . GERD (gastroesophageal reflux disease)   . Gout   . History of kidney stones   . Malignant tumor of kidney ([place])   . Pain    LOWER BACK AND LEFT HIP - HX OF PREVIOUS LUMBAR AND CERVICAL SURGERY--PT STATES HE HAS HAD NUMBNESS IN BOTH FEET SINCE HIS BACK SURGERY  . Renal calculus   . Shortness of breath     Past Surgical History:  Procedure Laterality Date  . BACK SURGERY     LUMBAR SURGERY  . CATARACT EXTRACTION Bilateral 2003   Surgeon unknown  . EYE SURGERY     BILATERAL CATARACT EXTRACTIONS  . GIVENS CAPSULE STUDY N/A 10/07/2015   Procedure:  GIVENS CAPSULE STUDY;  Surgeon: Carol Ada, MD;  Location: [facility];  Service: Endoscopy;  Laterality: N/A;  . HAND SURGERY     left  . KNEE SURGERY     right  . LAMINECTOMY     cervical  . MEMBRANE PEEL Left 03/22/2017   Procedure: POSSIBLE MEMBRANE PEEL WITH SILICONE OIL AND PERFLURON;  Surgeon: [name], MD;  Location: [facility];  Service: Ophthalmology;  Laterality: Left;  . NEPHRECTOMY     PT STATES RIGHT KIDNEY WAS REMOVED  . PARS PLANA VITRECTOMY Left 03/22/2017   Procedure: PARS PLANA VITRECTOMY WITH 25 GAUGE;  Surgeon: [name], MD;  Location: [facility];  Service: Ophthalmology;  Laterality: Left;  . 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 TOTAL KNEE ARTHROPLASTY Left 10/19/2012    Procedure: LEFT TOTAL KNEE ARTHROPLASTY;  Surgeon: Magnus Sinning, MD;  [facility];  Service: Orthopedics;  Laterality: Left;  [name] VITRECTOMY 25 GAUGE WITH SCLERAL BUCKLE Left 02/17/2017   Procedure: VITRECTOMY 25 GAUGE WITH SCLERAL BUCKLE membrane peel, injection of silicone oil, and endolaser photocoagulation.;  Surgeon: [name], MD;  Location: [facility];  Service: Ophthalmology;  Laterality: Left;    Family History  Problem Relation Age of Onset  . Diabetes Mother   . COPD Brother   . COPD Sister   . Amblyopia Neg Hx   . Blindness Neg Hx   . Cataracts Neg Hx   . Glaucoma Neg Hx   . Macular degeneration Neg Hx   . Retinal detachment Neg Hx   . Retinitis pigmentosa Neg Hx     Social History   Socioeconomic History  . Marital status: Married    Spouse name: Not on file  . Number of children: Not on file  . Years of education: Not on file  . Highest education level: Not on file  Social Needs  . Financial resource strain: Not on file  . Food insecurity - worry: Not on file  . Food insecurity - inability: Not on file  . Transportation needs - medical: Not on file  . Transportation needs - non-medical: Not on file  Occupational History  . Not on file  Tobacco Use  . Smoking status: Former Smoker    Packs/day: 2.00    Years: 40.00    Pack years: 80.00    Types: Cigarettes, Pipe    Last attempt to quit: 06/09/1995    Years since quitting: 22.1  . Smokeless tobacco: Never Used  Substance and Sexual Activity  . Alcohol use: No  . Drug use: No  . Sexual activity: Not on file  Other Topics Concern  . Not on file  Social History Narrative  . Not on file   Review of systems: Review of Systems  Constitutional: Negative for fever and chills.  HENT: Negative.   Eyes: Negative for blurred vision.  Respiratory: as per HPI  Cardiovascular: Negative for chest pain and palpitations.  Gastrointestinal: Negative for vomiting, diarrhea, blood per rectum. Genitourinary:  Negative for dysuria, urgency, frequency and hematuria.  Musculoskeletal: Negative for myalgias, back pain and joint pain.  Skin: Negative for itching and rash.  Neurological: Negative for dizziness, tremors, focal weakness, seizures and loss of consciousness.  Endo/Heme/Allergies: Negative for environmental allergies.  Psychiatric/Behavioral: Negative for depression, suicidal ideas and hallucinations.  All other systems reviewed and are negative.  Physical Exam: Blood pressure 130/80, pulse 71, height 6\' 1"  (1.854 m), weight 195 lb 6.4 oz (88.6 kg), SpO2 94 %. Gen:  No acute distress HEENT:  EOMI, sclera anicteric Neck:     No masses; no thyromegaly Lungs:    Scattered exp wheeze; normal respiratory effort CV:         Regular rate and rhythm; no murmurs Abd:      + bowel sounds; soft, non-tender; no palpable masses, no distension Ext:    No edema; adequate peripheral perfusion Skin:      Warm and dry; no rash Neuro: alert and oriented x 3 Psych: normal mood and affect  Data Reviewed: CT scan 10/21/09- Moderate centrilobar emphysema. No pulmonary masses, opacities, asbestos disease or consolidation. Stable enlarged. Stable enlarged mediastinal and bilateral hilar lymph nodes unchanged for past 3.5 years compatible with benign or reactive process.  CT scan 06/19/16-emphysema, no consolidation or lung opacity. Mediastinal and bilateral hilar lymphadenopathy stable in size. Clustered left upper lobe nodules.  CT scan 07/01/17- stable subcentimeter pulmonary nodules, stable calcified mediastinal, hilar lymphadenopathy. I have reviewed all images personally.   PFTs 02/04/16 FVC 4.05 [9%), FEV1 2.40 (70%), F/F 59, TLC 101%, DLCO 35% Moderate obstructive defect with severe reduction in diffusion capacity.  CBC 05/21/17-WBC 7.2, eosinophils 3.7%, absolute eos count 266 Blood allergy profile 05/21/17-IgE 22, RAST panel is negative.     Assessment:  Gold stage B COPD. Symptoms are  worsened after he was taken off Daliresp. He could not afford the daliresp due to cost involved.   Has not tolerated LABA, LAMA combination with stiolto or anoro.  We will put him back on inhaler regimen with steroids.  He may need inhaled steroids as eosinophil count peripheral blood is 266. Suspicion for asthma is low. Prescribe trelegy. Stop advair  Minor hemoptysis, in early 2018 Subcm pulm nodules He has no recurrence of hemoptysis and no obvious source on CT scan. He has new tiny left upper lobe nodules and mediastinal LNs which has remained stable on follow up CT  Plan/Recommendations: - Stop advair. Start trelegy. Continue off daliresp.  Follow up in 3 months  Marshell Garfinkel MD [facility] Pager [phone] 07/30/2017, 9:43 AM  CC: [name], MD

## 2017-08-05 DIAGNOSIS — M25562 Pain in left knee: Secondary | ICD-10-CM | POA: Diagnosis not present

## 2017-08-05 DIAGNOSIS — Z0000 Encounter for general adult medical examination without abnormal findings: Secondary | ICD-10-CM | POA: Diagnosis not present

## 2017-08-05 DIAGNOSIS — C649 Malignant neoplasm of unspecified kidney, except renal pelvis: Secondary | ICD-10-CM | POA: Diagnosis not present

## 2017-08-05 DIAGNOSIS — R7301 Impaired fasting glucose: Secondary | ICD-10-CM | POA: Diagnosis not present

## 2017-08-05 DIAGNOSIS — D509 Iron deficiency anemia, unspecified: Secondary | ICD-10-CM | POA: Diagnosis not present

## 2017-08-05 DIAGNOSIS — N183 Chronic kidney disease, stage 3 (moderate): Secondary | ICD-10-CM | POA: Diagnosis not present

## 2017-08-05 DIAGNOSIS — D692 Other nonthrombocytopenic purpura: Secondary | ICD-10-CM | POA: Diagnosis not present

## 2017-08-05 DIAGNOSIS — J449 Chronic obstructive pulmonary disease, unspecified: Secondary | ICD-10-CM | POA: Diagnosis not present

## 2017-08-05 DIAGNOSIS — Z86010 Personal history of colonic polyps: Secondary | ICD-10-CM | POA: Diagnosis not present

## 2017-08-05 DIAGNOSIS — M5416 Radiculopathy, lumbar region: Secondary | ICD-10-CM | POA: Diagnosis not present

## 2017-08-11 NOTE — Progress Notes (Signed)
[facility] Note  08/12/2017     CHIEF COMPLAINT Patient presents for Retina Follow Up   HISTORY OF PRESENT ILLNESS: Jerome Mays is a 77 y.o. male who presents to the clinic today for:   HPI    Retina Follow Up    Patient presents with  Retinal Break/Detachment.  In left eye.  Severity is mild.  Since onset it is stable.  I, the attending physician,  performed the HPI with the patient and updated documentation appropriately.          Comments    F/U mac off inferior RD w/PVR OS. Patient states his vision is about the same, he can see someday's better than others. Denies floater, flashes and ocular pain. Pt is using Pred Forte, Systane gtt's.        Last edited by [name], MD on 08/12/2017  9:13 AM. (History)    Pt states he feels OS VA is stable; Pt states he may be ready to have oil removed;   Referring physician: [name], MD [address] [place], [place] [address]  HISTORICAL INFORMATION:   Selected notes from the MEDICAL RECORD NUMBER Initial referral from Dr. [name] for RD OS s/p SBP (42 band) + 25g PPV/PFC/EL/FAX/SO OS 9.12.18 s/p PPV w/ membrane peel/relaxing retinectomy/PFC/SOX OS under GA, 10.15.18   CURRENT MEDICATIONS: Current Outpatient Medications (Ophthalmic Drugs)  Medication Sig  . Polyethyl Glycol-Propyl Glycol (SYSTANE OP) Place 1 drop into both eyes as needed (for dry eyes).  . bacitracin-polymyxin b (POLYSPORIN) ophthalmic ointment Place into the left eye at bedtime as needed. (Patient not taking: Reported on 08/12/2017)   No current facility-administered medications for this visit.  (Ophthalmic Drugs)   Current Outpatient Medications (Other)  Medication Sig  . allopurinol (ZYLOPRIM) 300 MG tablet Take 300 mg by mouth daily.   [name] aspirin EC 81 MG tablet Take 81 mg by mouth daily.  [name] DALIRESP 500 MCG TABS tablet TAKE 1 TABLET BY MOUTH ONCE DAILY  . diphenhydramine-acetaminophen (TYLENOL PM) 25-500 MG TABS  tablet Take 2 tablets by mouth at bedtime.  . Fluticasone-Umeclidin-Vilant (TRELEGY ELLIPTA) 100-62.5-25 MCG/INH AEPB Inhale 1 puff into the lungs daily.  . Multiple Vitamin (ONE-A-DAY MENS PO) Take 1 tablet by mouth daily.  [name] umeclidinium-vilanterol (ANORO ELLIPTA) 62.5-25 MCG/INH AEPB Inhale 1 puff into the lungs daily.  . predniSONE (DELTASONE) 10 MG tablet Take 4 tablets x 3 days take 3 tablets x 3 days take 2 tablets x 3 days take 1 tablet x 3 days then stop (Patient not taking: Reported on 08/12/2017)   No current facility-administered medications for this visit.  (Other)      REVIEW OF SYSTEMS: ROS    Positive for: Musculoskeletal, Cardiovascular, Eyes, Respiratory   Negative for: Constitutional, Gastrointestinal, Neurological, Skin, Genitourinary, HENT, Endocrine, Psychiatric, Allergic/Imm, Heme/Lymph   Last edited by Zenovia Jordan, LPN on 06/09/9415  4:08 AM. (History)       ALLERGIES Allergies  Allergen Reactions  . Lipitor [Atorvastatin] Rash and Other (See Comments)    Passed out    PAST MEDICAL HISTORY Past Medical History:  Diagnosis Date  . Anemia    low iron  . Arthritis   . Colonic polyp   . Constipation   . COPD (chronic obstructive pulmonary disease) ([place])   . GERD (gastroesophageal reflux disease)   . Gout   . History of kidney stones   . Malignant tumor of kidney ([place])   . Pain  LOWER BACK AND LEFT HIP - HX OF PREVIOUS LUMBAR AND CERVICAL SURGERY--PT STATES HE HAS HAD NUMBNESS IN BOTH FEET SINCE HIS BACK SURGERY  . Renal calculus   . Shortness of breath    Past Surgical History:  Procedure Laterality Date  . BACK SURGERY     LUMBAR SURGERY  . CATARACT EXTRACTION Bilateral 2003   Surgeon unknown  . EYE SURGERY     BILATERAL CATARACT EXTRACTIONS  . GIVENS CAPSULE STUDY N/A 10/07/2015   Procedure: GIVENS CAPSULE STUDY;  Surgeon: Carol Ada, MD;  Location: [facility];  Service: Endoscopy;  Laterality: N/A;  . HAND SURGERY     left  . KNEE  SURGERY     right  . LAMINECTOMY     cervical  . MEMBRANE PEEL Left 03/22/2017   Procedure: POSSIBLE MEMBRANE PEEL WITH SILICONE OIL AND PERFLURON;  Surgeon: [name], MD;  Location: [facility];  Service: Ophthalmology;  Laterality: Left;  . NEPHRECTOMY     PT STATES RIGHT KIDNEY WAS REMOVED  . PARS PLANA VITRECTOMY Left 03/22/2017   Procedure: PARS PLANA VITRECTOMY WITH 25 GAUGE;  Surgeon: [name], MD;  Location: [facility];  Service: Ophthalmology;  Laterality: Left;  . 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 TOTAL KNEE ARTHROPLASTY Left 10/19/2012   Procedure: LEFT TOTAL KNEE ARTHROPLASTY;  Surgeon: Magnus Sinning, MD;  [facility];  Service: Orthopedics;  Laterality: Left;  [name] VITRECTOMY 25 GAUGE WITH SCLERAL BUCKLE Left 02/17/2017   Procedure: VITRECTOMY 25 GAUGE WITH SCLERAL BUCKLE membrane peel, injection of silicone oil, and endolaser photocoagulation.;  Surgeon: [name], MD;  Location: [facility];  Service: Ophthalmology;  Laterality: Left;    FAMILY HISTORY Family History  Problem Relation Age of Onset  . Diabetes Mother   . COPD Brother   . COPD Sister   . Amblyopia Neg Hx   . Blindness Neg Hx   . Cataracts Neg Hx   . Glaucoma Neg Hx   . Macular degeneration Neg Hx   . Retinal detachment Neg Hx   . Retinitis pigmentosa Neg Hx     SOCIAL HISTORY Social History   Tobacco Use  . Smoking status: Former Smoker    Packs/day: 2.00    Years: 40.00    Pack years: 80.00    Types: Cigarettes, Pipe    Last attempt to quit: 06/09/1995    Years since quitting: 22.1  . Smokeless tobacco: Never Used  Substance Use Topics  . Alcohol use: No  . Drug use: No         OPHTHALMIC EXAM:  Base Eye Exam    Visual Acuity (Snellen - Linear)      Right Left   Dist [place] 20/20 -2 20/800   Dist ph [place] 20/20 -1 20/150 -1       Tonometry (Tonopen, 8:14 AM)      Right Left   Pressure 12 13       Pupils      Dark Light Shape React APD   Right 2 1.5 Round Brisk None   Left  6  Round NR None       Visual Fields (Counting fingers)      Left Right     Full   Restrictions Partial outer superior temporal, inferior temporal deficiencies        Extraocular Movement      Right Left    Full, Ortho Full, Ortho       Neuro/Psych  Oriented x3:  Yes   Mood/Affect:  Normal       Dilation    Both eyes:  1.0% Mydriacyl, 2.5% Phenylephrine @ 8:14 AM        Slit Lamp and Fundus Exam    External Exam      Right Left   External Normal Periorbital edema improving       Slit Lamp Exam      Right Left   Lids/Lashes Dermatochalasis - upper lid, Telangiectasia Dermatochalasis - upper lid, Meibomian gland dysfunction, Telangiectasia   Conjunctiva/Sclera White and quiet White and quiet   Cornea Clear Clear   Anterior Chamber Deep and quiet Deep, 1+ cell/pigment   Iris Round and dilated Round and dilated - dilation 69m   Lens Three piece Posterior chamber intraocular lens Three piece Posterior chamber intraocular lens in good position, 1+ condensations   Vitreous Vitreous syneresis Post vitrectomy with good silicone oil fill;        Fundus Exam      Right Left   Disc Temporal and Nasal Peripapillary pigment  Normal   C/D Ratio 0.45 0.45   Macula Flat; , Retinal pigment epithelial mottling - mild, No heme or edema attached under oil   Vessels Copper wiring Vascular attenuation - mild   Periphery Attached, Flat; Scattered Reticular degeneration; no RT or RD moderate buckle height; temporal/inf retinectomy from 2-630 with peripheral edge attached with good laser at the edge; trace areas of fibrosis inferiorly and inf temporal -- surrounded by good laser          IMAGING AND PROCEDURES  Imaging and Procedures for 08/12/17  OCT, Retina - OU - Both Eyes     Right Eye Quality was good. Central Foveal Thickness: 252. Progression has been stable. Findings include normal foveal contour, no IRF, no SRF.   Left Eye Quality was good. Central Foveal Thickness:  233. Progression has improved. Findings include no SRF, abnormal foveal contour (Cystic changes-mild interval increase, mild outer retinal thinning).   Notes Images taken, stored on drive  Diagnosis / Impression:  OD: NFP, No IRF, No SRF OS: retina attached under oil; cystic changes  Clinical management:  See below  Abbreviations: NFP - Normal foveal profile. CME - cystoid macular edema. PED - pigment epithelial detachment. IRF - intraretinal fluid. SRF - subretinal fluid. EZ - ellipsoid zone. ERM - epiretinal membrane. ORA - outer retinal atrophy. ORT - outer retinal tubulation. SRHM - subretinal hyper-reflective material                  ASSESSMENT/PLAN:    ICD-10-CM   1. 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 Retinal edema H35.81 OCT, Retina - OU - Both Eyes    1,2. 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 - inferior detachment from 2-10 oclock with HST at ~530 - fovea off, but sup nasal macula attached - by history RD likely started 4-5 wks prior to initial presentation, but fovea became involved ~Saturday, 02/13/17 - s/p SBP (42 band) + 25g PPV/PFC/EL/FAX/SO OS 9.12.18 - extensive PVR along inf temp arcades with tractional redetachment inf temp noted POM1 - s/p PPV w/ membrane peel/relaxing retinectomy/PFC/SOX OS under GA, 10.15.18             - doing well             - retina attached and in good position -- [place]  pinholed to 20/100 today  - mild focal areas of fibrosis inferiorly with good laser surrounding             - IOP good  - discussed RBA of silicon oil removal -- pt wishes to proceed with surgery  - informed consent obtained and signed  - will try to schedule either 08/19/17 or 08/26/17 -- pending OR availability  - 78E PPV w/ silicone oil removal and endolaser OS under general anesthesia             - cont PF BID OS             - PSO ung PRN OS   - Systane/AT  OS 4-6x/day OS             - avoid laying flat on back             - post op drop and positioning instructions reviewed             - f/u POD1  3. Pseudophakia OU - IOLs in good position OU - monitor  Ophthalmic Meds Ordered this visit:  No orders of the defined types were placed in this encounter.      Return for POD1.  There are no Patient Instructions on file for this visit.   Explained the diagnoses, plan, and follow up with the patient and they expressed understanding.  Patient expressed understanding of the importance of proper follow up care.   This document serves as a record of services personally performed by Gardiner Sleeper, MD, PhD. It was created on their behalf by Catha Brow, [place], a certified ophthalmic assistant. The creation of this record is the provider's dictation and/or activities during the visit.  Electronically signed by: Catha Brow, [place]  08/12/17 9:13 AM    Gardiner Sleeper, M.D., Ph.D. Diseases & Surgery of the Retina and [place] 08/12/17   I have reviewed the above documentation for accuracy and completeness, and I agree with the above. Gardiner Sleeper, M.D., Ph.D. 08/12/17 9:13 AM    Abbreviations: M myopia (nearsighted); A astigmatism; H hyperopia (farsighted); P presbyopia; Mrx spectacle prescription;  CTL contact lenses; OD right eye; OS left eye; OU both eyes  XT exotropia; ET esotropia; PEK punctate epithelial keratitis; PEE punctate epithelial erosions; DES dry eye syndrome; MGD meibomian gland dysfunction; ATs artificial tears; PFAT's preservative free artificial tears; [place] nuclear sclerotic cataract; PSC posterior subcapsular cataract; ERM epi-retinal membrane; PVD posterior vitreous detachment; RD retinal detachment; DM diabetes mellitus; DR diabetic retinopathy; NPDR non-proliferative diabetic retinopathy; PDR proliferative diabetic retinopathy; CSME clinically significant macular edema; DME  diabetic macular edema; dbh dot blot hemorrhages; CWS cotton wool spot; POAG primary open angle glaucoma; C/D cup-to-disc ratio; HVF humphrey visual field; GVF goldmann visual field; OCT optical coherence tomography; IOP intraocular pressure; BRVO Branch retinal vein occlusion; CRVO central retinal vein occlusion; CRAO central retinal artery occlusion; BRAO branch retinal artery occlusion; RT retinal tear; SB scleral buckle; PPV pars plana vitrectomy; VH Vitreous hemorrhage; PRP panretinal laser photocoagulation; IVK intravitreal kenalog; VMT vitreomacular traction; MH Macular hole;  NVD neovascularization of the disc; NVE neovascularization elsewhere; AREDS age related eye disease study; ARMD age related macular degeneration; POAG primary open angle glaucoma; EBMD epithelial/anterior basement membrane dystrophy; ACIOL anterior chamber intraocular lens; IOL intraocular lens; PCIOL posterior chamber intraocular lens; Phaco/IOL phacoemulsification with intraocular lens placement; [place] photorefractive keratectomy; LASIK laser assisted in situ keratomileusis; HTN hypertension; DM diabetes mellitus;  COPD chronic obstructive pulmonary disease

## 2017-08-12 ENCOUNTER — Ambulatory Visit (INDEPENDENT_AMBULATORY_CARE_PROVIDER_SITE_OTHER): Payer: Medicare HMO | Admitting: Ophthalmology

## 2017-08-12 ENCOUNTER — Encounter (INDEPENDENT_AMBULATORY_CARE_PROVIDER_SITE_OTHER): Payer: Self-pay | Admitting: Ophthalmology

## 2017-08-12 DIAGNOSIS — H3581 Retinal edema: Secondary | ICD-10-CM

## 2017-08-12 DIAGNOSIS — Z961 Presence of intraocular lens: Secondary | ICD-10-CM | POA: Diagnosis not present

## 2017-08-12 DIAGNOSIS — H3322 Serous retinal detachment, left eye: Secondary | ICD-10-CM | POA: Diagnosis not present

## 2017-08-12 DIAGNOSIS — H3522 Other non-diabetic proliferative retinopathy, left eye: Secondary | ICD-10-CM | POA: Diagnosis not present

## 2017-08-17 DIAGNOSIS — Z1212 Encounter for screening for malignant neoplasm of rectum: Secondary | ICD-10-CM | POA: Diagnosis not present

## 2017-08-17 NOTE — H&P (Signed)
Jerome Mays is an 77 y.o. male.    Chief Complaint: History of retinal detachment s/p repair with silicon oil OS.  HPI: Pt with history of chronic inferior retinal detachment with PVR s/p PPV w/ silicon oil x2.  Last surgery was 10.15.18. Has been doing well with retina reattached and stable. Pt wishes to undergo silicon oil removal to improve vision OS  Past Medical History:  Diagnosis Date  . Anemia    low iron  . Arthritis   . Colonic polyp   . Constipation   . COPD (chronic obstructive pulmonary disease) ([place])   . GERD (gastroesophageal reflux disease)   . Gout   . History of kidney stones   . Malignant tumor of kidney ([place])   . Pain    LOWER BACK AND LEFT HIP - HX OF PREVIOUS LUMBAR AND CERVICAL SURGERY--PT STATES HE HAS HAD NUMBNESS IN BOTH FEET SINCE HIS BACK SURGERY  . Renal calculus   . Shortness of breath     Past Surgical History:  Procedure Laterality Date  . BACK SURGERY     LUMBAR SURGERY  . CATARACT EXTRACTION Bilateral 2003   Surgeon unknown  . EYE SURGERY     BILATERAL CATARACT EXTRACTIONS  . GIVENS CAPSULE STUDY N/A 10/07/2015   Procedure: GIVENS CAPSULE STUDY;  Surgeon: Carol Ada, MD;  Location: [facility];  Service: Endoscopy;  Laterality: N/A;  . HAND SURGERY     left  . KNEE SURGERY     right  . LAMINECTOMY     cervical  . MEMBRANE PEEL Left 03/22/2017   Procedure: POSSIBLE MEMBRANE PEEL WITH SILICONE OIL AND PERFLURON;  Surgeon: [name], MD;  Location: [facility];  Service: Ophthalmology;  Laterality: Left;  . NEPHRECTOMY     PT STATES RIGHT KIDNEY WAS REMOVED  . PARS PLANA VITRECTOMY Left 03/22/2017   Procedure: PARS PLANA VITRECTOMY WITH 25 GAUGE;  Surgeon: [name], MD;  Location: [facility];  Service: Ophthalmology;  Laterality: Left;  . 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 TOTAL KNEE ARTHROPLASTY Left 10/19/2012   Procedure: LEFT TOTAL KNEE ARTHROPLASTY;  Surgeon: Magnus Sinning, MD;  [facility];  Service: Orthopedics;   Laterality: Left;  [name] VITRECTOMY 25 GAUGE WITH SCLERAL BUCKLE Left 02/17/2017   Procedure: VITRECTOMY 25 GAUGE WITH SCLERAL BUCKLE membrane peel, injection of silicone oil, and endolaser photocoagulation.;  Surgeon: [name], MD;  Location: [facility];  Service: Ophthalmology;  Laterality: Left;    Family History  Problem Relation Age of Onset  . Diabetes Mother   . COPD Brother   . COPD Sister   . Amblyopia Neg Hx   . Blindness Neg Hx   . Cataracts Neg Hx   . Glaucoma Neg Hx   . Macular degeneration Neg Hx   . Retinal detachment Neg Hx   . Retinitis pigmentosa Neg Hx    Social History:  reports that he quit smoking about 22 years ago. His smoking use included cigarettes and pipe. He has a 80.00 pack-year smoking history. he has never used smokeless tobacco. He reports that he does not drink alcohol or use drugs.  Allergies:  Allergies  Allergen Reactions  . Lipitor [Atorvastatin] Rash and Other (See Comments)    Passed out    No medications prior to admission.    Review of systems otherwise negative  There were no vitals taken for this visit.  Physical exam: Mental status: oriented x3. Eyes: See eye exam associated with this date  of surgery Ears, Nose, Throat: within normal limits Neck: Within Normal limits General: within normal limits Chest: Within normal limits Breast: deferred Heart: Within normal limits Abdomen: Within normal limits GU: deferred Extremities: within normal limits Skin: within normal limits  Assessment/Plan 1. History of retinal detachment w/ PVR s/p PPV/MP/retinectomy/PFO/EL/SOX OS on 10.15.18 - pt wishes to undergo silicon oil removal OS to improve vision  Plan: To [facility] for 30Z PPV/silicon oil removal/endolaser OS under general anesthesia - scheduled for 3.14.19  Gardiner Sleeper, M.D., Ph.D. Vitreoretinal Surgeon [facility]

## 2017-08-18 ENCOUNTER — Encounter (HOSPITAL_COMMUNITY): Payer: Self-pay | Admitting: *Deleted

## 2017-08-18 ENCOUNTER — Other Ambulatory Visit: Payer: Self-pay

## 2017-08-18 NOTE — Progress Notes (Signed)
SDW-pre-op call completed by pt spouse, [name]. Spouse denies that pt C/O any acute cardiopulmonary issues. Spouse denies that pt is under the care of a cardiologist. Spouse denies that pt had a stress test, echo and cardiac cath. Spouse denies that pt had a chest x ray and EKG within the last year. Labs requested from PCP, Dr. Reynaldo Minium of [place] stated that pt was instructed to stop taking blood thinners. Spouse also made aware to have pt stop taking vitamins, fish oil and herbal medications. Do not take any NSAIDs ie: Ibuprofen, Advil, Naproxen (Aleve), Motrin, BC and Goody Powder. Spouse verbalized understanding of all pre-op instructions.

## 2017-08-19 ENCOUNTER — Ambulatory Visit (HOSPITAL_COMMUNITY)
Admission: RE | Admit: 2017-08-19 | Discharge: 2017-08-19 | Disposition: A | Discharge status: Home / Self Care | Payer: Medicare HMO | Source: Ambulatory Visit | Attending: Ophthalmology | Admitting: Ophthalmology

## 2017-08-19 ENCOUNTER — Encounter (HOSPITAL_COMMUNITY)
Admission: RE | Disposition: A | Discharge status: Home / Self Care | Payer: Self-pay | Source: Ambulatory Visit | Attending: Ophthalmology

## 2017-08-19 ENCOUNTER — Ambulatory Visit (HOSPITAL_COMMUNITY): Payer: Medicare HMO | Admitting: Certified Registered"

## 2017-08-19 ENCOUNTER — Encounter (HOSPITAL_COMMUNITY): Payer: Self-pay | Admitting: *Deleted

## 2017-08-19 DIAGNOSIS — H338 Other retinal detachments: Secondary | ICD-10-CM | POA: Diagnosis not present

## 2017-08-19 DIAGNOSIS — M1990 Unspecified osteoarthritis, unspecified site: Secondary | ICD-10-CM | POA: Diagnosis not present

## 2017-08-19 DIAGNOSIS — Z87891 Personal history of nicotine dependence: Secondary | ICD-10-CM | POA: Diagnosis not present

## 2017-08-19 DIAGNOSIS — K219 Gastro-esophageal reflux disease without esophagitis: Secondary | ICD-10-CM | POA: Insufficient documentation

## 2017-08-19 DIAGNOSIS — Z85528 Personal history of other malignant neoplasm of kidney: Secondary | ICD-10-CM | POA: Insufficient documentation

## 2017-08-19 DIAGNOSIS — Z981 Arthrodesis status: Secondary | ICD-10-CM | POA: Diagnosis not present

## 2017-08-19 DIAGNOSIS — Z79899 Other long term (current) drug therapy: Secondary | ICD-10-CM | POA: Diagnosis not present

## 2017-08-19 DIAGNOSIS — J208 Acute bronchitis due to other specified organisms: Secondary | ICD-10-CM | POA: Diagnosis not present

## 2017-08-19 DIAGNOSIS — M109 Gout, unspecified: Secondary | ICD-10-CM | POA: Insufficient documentation

## 2017-08-19 DIAGNOSIS — Z96652 Presence of left artificial knee joint: Secondary | ICD-10-CM | POA: Insufficient documentation

## 2017-08-19 DIAGNOSIS — Z888 Allergy status to other drugs, medicaments and biological substances status: Secondary | ICD-10-CM | POA: Insufficient documentation

## 2017-08-19 DIAGNOSIS — H3322 Serous retinal detachment, left eye: Secondary | ICD-10-CM | POA: Diagnosis not present

## 2017-08-19 DIAGNOSIS — Z7982 Long term (current) use of aspirin: Secondary | ICD-10-CM | POA: Insufficient documentation

## 2017-08-19 DIAGNOSIS — J449 Chronic obstructive pulmonary disease, unspecified: Secondary | ICD-10-CM | POA: Insufficient documentation

## 2017-08-19 DIAGNOSIS — C649 Malignant neoplasm of unspecified kidney, except renal pelvis: Secondary | ICD-10-CM | POA: Diagnosis not present

## 2017-08-19 HISTORY — PX: LASER PHOTO ABLATION: SHX5942

## 2017-08-19 HISTORY — PX: PARS PLANA VITRECTOMY W/ SCLERAL BUCKLE: SHX2171

## 2017-08-19 HISTORY — DX: Serous retinal detachment, unspecified eye: H33.20

## 2017-08-19 LAB — BASIC METABOLIC PANEL
ANION GAP: 11 (ref 5–15)
BUN: 23 mg/dL — ABNORMAL HIGH (ref 6–20)
CALCIUM: 9 mg/dL (ref 8.9–10.3)
CHLORIDE: 110 mmol/L (ref 101–111)
CO2: 18 mmol/L — AB (ref 22–32)
Creatinine, Ser: 1.06 mg/dL (ref 0.61–1.24)
GFR calc non Af Amer: >60 mL/min (ref >60)
Glucose, Bld: 102 mg/dL — ABNORMAL HIGH (ref 65–99)
Potassium: 3.8 mmol/L (ref 3.5–5.1)
SODIUM: 139 mmol/L (ref 135–145)

## 2017-08-19 LAB — CBC
HCT: 41.9 % (ref 39.0–52.0)
HEMOGLOBIN: 13.5 g/dL (ref 13.0–17.0)
MCH: 28.8 pg (ref 26.0–34.0)
MCHC: 32.2 g/dL (ref 30.0–36.0)
MCV: 89.3 fL (ref 78.0–100.0)
Platelets: 160 10*3/uL (ref 150–400)
RBC: 4.69 MIL/uL (ref 4.22–5.81)
RDW: 14.7 % (ref 11.5–15.5)
WBC: 5.2 10*3/uL (ref 4.0–10.5)

## 2017-08-19 SURGERY — PARS PLANA VITRECTOMY WITH LASER FOR MACULAR HOLE
Anesthesia: General | Site: Eye | Laterality: Left

## 2017-08-19 MED ORDER — PREDNISOLONE ACETATE 1 % OP SUSP
OPHTHALMIC | Status: AC
Start: 2017-08-19 — End: 2017-08-19
  Filled 2017-08-19: qty 5

## 2017-08-19 MED ORDER — POLYMYXIN B SULFATE 500000 UNITS IJ SOLR
INTRAMUSCULAR | Status: AC
  Filled 2017-08-19: qty 500000

## 2017-08-19 MED ORDER — BACITRACIN-POLYMYXIN B 500-10000 UNIT/GM OP OINT
TOPICAL_OINTMENT | OPHTHALMIC | Status: AC
  Filled 2017-08-19: qty 3.5

## 2017-08-19 MED ORDER — ROCURONIUM BROMIDE 100 MG/10ML IV SOLN
INTRAVENOUS | Status: DC | PRN
  Administered 2017-08-19: 20 mg via INTRAVENOUS
  Administered 2017-08-19: 50 mg via INTRAVENOUS

## 2017-08-19 MED ORDER — BSS IO SOLN
INTRAOCULAR | Status: DC | PRN
  Administered 2017-08-19: 15 mL

## 2017-08-19 MED ORDER — ATROPINE SULFATE 1 % OP SOLN
OPHTHALMIC | Status: AC
Start: 2017-08-19 — End: 2017-08-19
  Filled 2017-08-19: qty 5

## 2017-08-19 MED ORDER — FENTANYL CITRATE (PF) 250 MCG/5ML IJ SOLN
INTRAMUSCULAR | Status: AC
  Filled 2017-08-19: qty 5

## 2017-08-19 MED ORDER — TOBRAMYCIN-DEXAMETHASONE 0.3-0.1 % OP OINT
TOPICAL_OINTMENT | OPHTHALMIC | Status: AC
  Filled 2017-08-19: qty 3.5

## 2017-08-19 MED ORDER — NA CHONDROIT SULF-NA HYALURON 40-30 MG/ML IO SOLN
INTRAOCULAR | Status: AC
  Filled 2017-08-19: qty 1

## 2017-08-19 MED ORDER — SODIUM CHLORIDE 0.9 % IV SOLN
INTRAVENOUS | Status: DC
  Administered 2017-08-19: 09:00:00 via INTRAVENOUS

## 2017-08-19 MED ORDER — TRIAMCINOLONE ACETONIDE 40 MG/ML IJ SUSP
INTRAMUSCULAR | Status: DC | PRN
  Administered 2017-08-19: 40 mg

## 2017-08-19 MED ORDER — TRIAMCINOLONE ACETONIDE 40 MG/ML IJ SUSP
INTRAMUSCULAR | Status: AC
  Filled 2017-08-19: qty 5

## 2017-08-19 MED ORDER — NA CHONDROIT SULF-NA HYALURON 40-30 MG/ML IO SOLN
INTRAOCULAR | Status: DC | PRN
  Administered 2017-08-19: 0.5 mL via INTRAOCULAR

## 2017-08-19 MED ORDER — DEXTROSE 50 % IV SOLN
INTRAVENOUS | Status: AC
  Filled 2017-08-19: qty 50

## 2017-08-19 MED ORDER — DEXAMETHASONE SODIUM PHOSPHATE 10 MG/ML IJ SOLN
INTRAMUSCULAR | Status: AC
  Filled 2017-08-19: qty 1

## 2017-08-19 MED ORDER — STERILE WATER FOR IRRIGATION IR SOLN
Status: DC | PRN
  Administered 2017-08-19: 1000 mL

## 2017-08-19 MED ORDER — CEFTAZIDIME 1 G IJ SOLR
INTRAMUSCULAR | Status: AC
  Filled 2017-08-19: qty 1

## 2017-08-19 MED ORDER — BSS IO SOLN
INTRAOCULAR | Status: AC
  Filled 2017-08-19: qty 15

## 2017-08-19 MED ORDER — FENTANYL CITRATE (PF) 100 MCG/2ML IJ SOLN: 25.0000 ug | INTRAMUSCULAR | Status: DC | PRN

## 2017-08-19 MED ORDER — SUGAMMADEX SODIUM 200 MG/2ML IV SOLN
INTRAVENOUS | Status: DC | PRN
  Administered 2017-08-19: 200 mg via INTRAVENOUS

## 2017-08-19 MED ORDER — DEXAMETHASONE SODIUM PHOSPHATE 4 MG/ML IJ SOLN
INTRAMUSCULAR | Status: DC | PRN
  Administered 2017-08-19: 5 mg via INTRAVENOUS

## 2017-08-19 MED ORDER — EPINEPHRINE PF 1 MG/ML IJ SOLN
INTRAMUSCULAR | Status: AC
  Filled 2017-08-19: qty 1

## 2017-08-19 MED ORDER — EPHEDRINE SULFATE-NACL 50-0.9 MG/10ML-% IV SOSY
PREFILLED_SYRINGE | INTRAVENOUS | Status: DC | PRN
  Administered 2017-08-19 (×2): 5 mg via INTRAVENOUS

## 2017-08-19 MED ORDER — LACTATED RINGERS IV SOLN
INTRAVENOUS | Status: DC | PRN
  Administered 2017-08-19 (×2): via INTRAVENOUS

## 2017-08-19 MED ORDER — EPINEPHRINE PF 1 MG/ML IJ SOLN
INTRAOCULAR | Status: DC | PRN
  Administered 2017-08-19: 500 mL

## 2017-08-19 MED ORDER — LIDOCAINE HCL (CARDIAC) 20 MG/ML IV SOLN
INTRAVENOUS | Status: AC
  Filled 2017-08-19: qty 5

## 2017-08-19 MED ORDER — PREDNISOLONE ACETATE 1 % OP SUSP
OPHTHALMIC | Status: DC | PRN
  Administered 2017-08-19: 1 [drp] via OPHTHALMIC

## 2017-08-19 MED ORDER — ONDANSETRON HCL 4 MG/2ML IJ SOLN
INTRAMUSCULAR | Status: DC | PRN
  Administered 2017-08-19: 4 mg via INTRAVENOUS

## 2017-08-19 MED ORDER — STERILE WATER FOR INJECTION IJ SOLN
INTRAMUSCULAR | Status: DC | PRN
  Administered 2017-08-19: 20 mL

## 2017-08-19 MED ORDER — GATIFLOXACIN 0.5 % OP SOLN
OPHTHALMIC | Status: DC | PRN
  Administered 2017-08-19: 1 [drp] via OPHTHALMIC

## 2017-08-19 MED ORDER — PHENYLEPHRINE HCL 2.5 % OP SOLN
1.0000 [drp] | OPHTHALMIC | Status: AC | PRN
  Administered 2017-08-19 (×3): 1 [drp] via OPHTHALMIC
  Filled 2017-08-19: qty 2

## 2017-08-19 MED ORDER — INDOCYANINE GREEN 25 MG IV SOLR
INTRAVENOUS | Status: AC
  Filled 2017-08-19: qty 25

## 2017-08-19 MED ORDER — EPHEDRINE 5 MG/ML INJ
INTRAVENOUS | Status: AC
  Filled 2017-08-19: qty 10

## 2017-08-19 MED ORDER — BSS PLUS IO SOLN
INTRAOCULAR | Status: AC
  Filled 2017-08-19: qty 500

## 2017-08-19 MED ORDER — PROPOFOL 10 MG/ML IV BOLUS
INTRAVENOUS | Status: AC
  Filled 2017-08-19: qty 20

## 2017-08-19 MED ORDER — LIDOCAINE HCL (CARDIAC) 20 MG/ML IV SOLN
INTRAVENOUS | Status: DC | PRN
  Administered 2017-08-19: 80 mg via INTRAVENOUS

## 2017-08-19 MED ORDER — SODIUM CHLORIDE 0.9 % IV SOLN: INTRAVENOUS | Status: DC | PRN

## 2017-08-19 MED ORDER — DORZOLAMIDE HCL-TIMOLOL MAL 2-0.5 % OP SOLN
OPHTHALMIC | Status: AC
  Filled 2017-08-19: qty 10

## 2017-08-19 MED ORDER — ROCURONIUM BROMIDE 10 MG/ML (PF) SYRINGE
PREFILLED_SYRINGE | INTRAVENOUS | Status: AC
  Filled 2017-08-19: qty 5

## 2017-08-19 MED ORDER — ACETAZOLAMIDE SODIUM 500 MG IJ SOLR
INTRAMUSCULAR | Status: AC
Start: 2017-08-19 — End: 2017-08-19
  Filled 2017-08-19: qty 500

## 2017-08-19 MED ORDER — BRIMONIDINE TARTRATE 0.2 % OP SOLN
OPHTHALMIC | Status: AC
  Filled 2017-08-19: qty 5

## 2017-08-19 MED ORDER — SUGAMMADEX SODIUM 200 MG/2ML IV SOLN
INTRAVENOUS | Status: AC
  Filled 2017-08-19: qty 2

## 2017-08-19 MED ORDER — PROPARACAINE HCL 0.5 % OP SOLN
1.0000 [drp] | OPHTHALMIC | Status: AC | PRN
  Administered 2017-08-19 (×4): 1 [drp] via OPHTHALMIC
  Filled 2017-08-19: qty 15

## 2017-08-19 MED ORDER — GATIFLOXACIN 0.5 % OP SOLN
OPHTHALMIC | Status: AC
  Filled 2017-08-19: qty 2.5

## 2017-08-19 MED ORDER — DEXAMETHASONE SODIUM PHOSPHATE 10 MG/ML IJ SOLN
INTRAMUSCULAR | Status: AC
Start: 2017-08-19 — End: 2017-08-19
  Filled 2017-08-19: qty 1

## 2017-08-19 MED ORDER — TROPICAMIDE 1 % OP SOLN
1.0000 [drp] | OPHTHALMIC | Status: AC | PRN
  Administered 2017-08-19 (×3): 1 [drp] via OPHTHALMIC
  Filled 2017-08-19: qty 15

## 2017-08-19 MED ORDER — 0.9 % SODIUM CHLORIDE (POUR BTL) OPTIME: TOPICAL | Status: DC | PRN

## 2017-08-19 MED ORDER — STERILE WATER FOR INJECTION IJ SOLN
INTRAMUSCULAR | Status: AC
  Filled 2017-08-19: qty 10

## 2017-08-19 MED ORDER — SODIUM CHLORIDE 0.9 % IJ SOLN
INTRAMUSCULAR | Status: AC
  Filled 2017-08-19: qty 10

## 2017-08-19 MED ORDER — DORZOLAMIDE HCL-TIMOLOL MAL 2-0.5 % OP SOLN
OPHTHALMIC | Status: DC | PRN
  Administered 2017-08-19: 1 [drp] via OPHTHALMIC

## 2017-08-19 MED ORDER — PHENYLEPHRINE 40 MCG/ML (10ML) SYRINGE FOR IV PUSH (FOR BLOOD PRESSURE SUPPORT)
PREFILLED_SYRINGE | INTRAVENOUS | Status: DC | PRN
  Administered 2017-08-19: 40 ug via INTRAVENOUS

## 2017-08-19 MED ORDER — LIDOCAINE HCL (PF) 2 % IJ SOLN
INTRAMUSCULAR | Status: AC
  Filled 2017-08-19: qty 10

## 2017-08-19 MED ORDER — CARBACHOL 0.01 % IO SOLN
INTRAOCULAR | Status: AC
  Filled 2017-08-19: qty 1.5

## 2017-08-19 MED ORDER — BACITRACIN-POLYMYXIN B 500-10000 UNIT/GM OP OINT
TOPICAL_OINTMENT | OPHTHALMIC | Status: DC | PRN
  Administered 2017-08-19: 1 via OPHTHALMIC

## 2017-08-19 MED ORDER — FENTANYL CITRATE (PF) 100 MCG/2ML IJ SOLN
INTRAMUSCULAR | Status: DC | PRN
  Administered 2017-08-19 (×2): 50 ug via INTRAVENOUS

## 2017-08-19 MED ORDER — PROPOFOL 10 MG/ML IV BOLUS
INTRAVENOUS | Status: DC | PRN
  Administered 2017-08-19: 50 mg via INTRAVENOUS
  Administered 2017-08-19: 150 mg via INTRAVENOUS

## 2017-08-19 MED ORDER — BRIMONIDINE TARTRATE 0.2 % OP SOLN
OPHTHALMIC | Status: DC | PRN
  Administered 2017-08-19: 1 [drp] via OPHTHALMIC

## 2017-08-19 MED ORDER — BUPIVACAINE HCL (PF) 0.75 % IJ SOLN
INTRAMUSCULAR | Status: AC
  Filled 2017-08-19: qty 10

## 2017-08-19 MED ORDER — PHENYLEPHRINE HCL 10 MG/ML IJ SOLN
INTRAVENOUS | Status: DC | PRN
  Administered 2017-08-19: 20 ug/min via INTRAVENOUS

## 2017-08-19 MED ORDER — CYCLOPENTOLATE HCL 1 % OP SOLN
1.0000 [drp] | OPHTHALMIC | Status: AC | PRN
  Administered 2017-08-19 (×3): 1 [drp] via OPHTHALMIC
  Filled 2017-08-19: qty 2

## 2017-08-19 MED ORDER — ATROPINE SULFATE 1 % OP SOLN
OPHTHALMIC | Status: DC | PRN
  Administered 2017-08-19: 1 [drp] via OPHTHALMIC

## 2017-08-19 SURGICAL SUPPLY — 34 items
BLADE MVR KNIFE 19G (BLADE) ×2 IMPLANT
CABLE BIPOLOR RESECTION CORD (MISCELLANEOUS) ×2 IMPLANT
CANNULA FLEX TIP 25G (CANNULA) ×2 IMPLANT
CANNULA TROCAR 23 GA VLV (OPHTHALMIC) ×2 IMPLANT
COTTONBALL LRG STERILE PKG (GAUZE/BANDAGES/DRESSINGS) ×6 IMPLANT
DRAPE MICROSCOPE LEICA 46X105 (MISCELLANEOUS) ×2 IMPLANT
GAUZE SPONGE 4X4 12PLY STRL (GAUZE/BANDAGES/DRESSINGS) ×2 IMPLANT
GLOVE BIOGEL M 7.0 STRL (GLOVE) ×2 IMPLANT
GLOVE ECLIPSE 8.0 STRL XLNG CF (GLOVE) ×2 IMPLANT
GLOVE SS BIOGEL STRL SZ 7 (GLOVE) ×1 IMPLANT
GLOVE SS BIOGEL STRL SZ 7.5 (GLOVE) ×1 IMPLANT
GLOVE SUPERSENSE BIOGEL SZ 7 (GLOVE) ×1
GLOVE SUPERSENSE BIOGEL SZ 7.5 (GLOVE) ×1
GOWN STRL REUS W/ TWL LRG LVL3 (GOWN DISPOSABLE) ×1 IMPLANT
GOWN STRL REUS W/ TWL XL LVL3 (GOWN DISPOSABLE) ×2 IMPLANT
GOWN STRL REUS W/TWL LRG LVL3 (GOWN DISPOSABLE) ×1
GOWN STRL REUS W/TWL XL LVL3 (GOWN DISPOSABLE) ×2
KIT BASIN OR (CUSTOM PROCEDURE TRAY) ×2 IMPLANT
NEEDLE 18GX1X1/2 (RX/OR ONLY) (NEEDLE) ×2 IMPLANT
NEEDLE 25GX 5/8IN NON SAFETY (NEEDLE) ×2 IMPLANT
NEEDLE FILTER BLUNT 18X 1/2SAF (NEEDLE) ×1
NEEDLE FILTER BLUNT 18X1 1/2 (NEEDLE) ×1 IMPLANT
NS IRRIG 1000ML POUR BTL (IV SOLUTION) ×2 IMPLANT
PAD ARMBOARD 7.5X6 YLW CONV (MISCELLANEOUS) ×4 IMPLANT
PAK VITRECTOMY PIK 25 GA (OPHTHALMIC RELATED) ×2 IMPLANT
PROBE ENDO DIATHERMY 25G (MISCELLANEOUS) ×2 IMPLANT
PROBE LASER ILLUM FLEX CVD 25G (OPHTHALMIC) ×2 IMPLANT
ROLLS DENTAL (MISCELLANEOUS) ×4 IMPLANT
SET INJECTOR OIL FLUID CONSTEL (OPHTHALMIC) ×2 IMPLANT
STRIP CLOSURE SKIN 1/2X4 (GAUZE/BANDAGES/DRESSINGS) ×2 IMPLANT
SUT VICRYL 7 0 TG140 8 (SUTURE) ×2 IMPLANT
SYR 20CC LL (SYRINGE) ×2 IMPLANT
SYR TB 1ML LUER SLIP (SYRINGE) ×2 IMPLANT
WATER STERILE IRR 1000ML POUR (IV SOLUTION) ×2 IMPLANT

## 2017-08-19 NOTE — Transfer of Care (Signed)
Immediate Anesthesia Transfer of Care Note  Patient: Jerome Mays  Procedure(s) Performed: 25 GAUGE PARS PLANA VITRECTOMY WITH SILCONE OIL REMOVAL (Left Eye) LASER PHOTO ABLATION (Left Eye)  Patient Location: PACU  Anesthesia Type:General  Level of Consciousness: awake and patient cooperative  Airway & Oxygen Therapy: Patient Spontanous Breathing and Patient connected to face mask oxygen  Post-op Assessment: Report given to RN and Post -op Vital signs reviewed and stable  Post vital signs: Reviewed and stable  Last Vitals:  Vitals:   08/19/17 0857 08/19/17 1443  BP: 136/70 (P) 118/65  Pulse: 67 (P) 71  Resp: 18 (P) 16  Temp: 36.6 C (P) 36.7 C  SpO2: 95%     Last Pain:  Vitals:   08/19/17 0913  TempSrc:   PainSc: 2       Patients Stated Pain Goal: 2 (00/37/04 8889)  Complications: No apparent anesthesia complications

## 2017-08-19 NOTE — Discharge Instructions (Signed)
POSTOPERATIVE INSTRUCTIONS  Your doctor has performed vitreoretinal surgery on you at [facility]. [place] eye patched and shielded until seen by Dr. Coralyn Pear 8 AM tomorrow in clinic - Do not use drops until return - [place] - Sleep with head of bed elevated to at least 30 degrees, avoid laying flat on back.    - No strenuous bending, stooping or lifting.  - You may not drive until further notice.  - Tylenol or any other over-the-counter pain reliever can be used according to your doctor. If more pain medicine is required, your doctor will have a prescription for you.  - You may read, go up and down stairs, and watch television.     [name], M.D., Ph.D.

## 2017-08-19 NOTE — Brief Op Note (Signed)
08/19/2017  2:39 PM  PATIENT:  Jerome Mays  77 y.o. male  PRE-OPERATIVE DIAGNOSIS:  History of retinal detachment with silcone oil injection, LEFT EYE  POST-OPERATIVE DIAGNOSIS:  History of retinal detachment with silcone oil injection, LEFT EYE  PROCEDURE:  Procedure(s): 25 GAUGE PARS PLANA VITRECTOMY WITH SILCONE OIL REMOVAL (Left) LASER PHOTO ABLATION (Left)  SURGEON:  Surgeon(s) and Role:    [name], MD - Primary  ASSISTANTS: Catha Brow, COA   ANESTHESIA:   general  EBL:  0 mL   BLOOD ADMINISTERED:none  DRAINS: none   LOCAL MEDICATIONS USED:  NONE  SPECIMEN:  No Specimen  DISPOSITION OF SPECIMEN:  N/A  COUNTS:  YES  TOURNIQUET:  * No tourniquets in log *  DICTATION: .Note written in EPIC  PLAN OF CARE: Discharge to home after PACU  PATIENT DISPOSITION:  PACU - hemodynamically stable.

## 2017-08-19 NOTE — Anesthesia Postprocedure Evaluation (Signed)
Anesthesia Post Note  Patient: Jerome Mays  Procedure(s) Performed: 25 GAUGE PARS PLANA VITRECTOMY WITH SILCONE OIL REMOVAL (Left Eye) LASER PHOTO ABLATION (Left Eye)     Patient location during evaluation: PACU Anesthesia Type: General Level of consciousness: awake and alert Pain management: pain level controlled Vital Signs Assessment: post-procedure vital signs reviewed and stable Respiratory status: spontaneous breathing, nonlabored ventilation, respiratory function stable and patient connected to nasal cannula oxygen Cardiovascular status: blood pressure returned to baseline and stable Postop Assessment: no apparent nausea or vomiting Anesthetic complications: no    Last Vitals:  Vitals:   08/19/17 1500 08/19/17 1505  BP: 125/60   Pulse: 63 64  Resp: 20 14  Temp:  (!) 36.3 C  SpO2: 91% 92%    Last Pain:  Vitals:   08/19/17 0913  TempSrc:   PainSc: 2                  Ryan P Ellender

## 2017-08-19 NOTE — Interval H&P Note (Signed)
History and Physical Interval Note:  08/19/2017 9:02 AM  Jerome Mays  has presented today for surgery, with the diagnosis of history retinal detachment with silcone oil injection  The various methods of treatment have been discussed with the patient and family. After consideration of risks, benefits and other options for treatment, the patient has consented to  Procedure(s): PARS PLANA VITRECTOMY WITH SILCONE OIL REMOVAL AND ENDO LASER (Left) as a surgical intervention .  The patient's history has been reviewed, patient examined, no change in status, stable for surgery.  I have reviewed the patient's chart and labs.  Questions were answered to the patient's satisfaction.     [name]

## 2017-08-19 NOTE — Anesthesia Preprocedure Evaluation (Signed)
Anesthesia Evaluation  Patient identified by MRN, date of birth, ID band Patient awake    Reviewed: Allergy & Precautions, NPO status , Patient's Chart, lab work & pertinent test results  Airway Mallampati: II  TM Distance: >3 FB Neck ROM: Full    Dental no notable dental hx.    Pulmonary COPD, former smoker,    Pulmonary exam normal breath sounds clear to auscultation       Cardiovascular negative cardio ROS Normal cardiovascular exam Rhythm:Regular Rate:Normal     Neuro/Psych negative neurological ROS  negative psych ROS   GI/Hepatic negative GI ROS, Neg liver ROS,   Endo/Other  negative endocrine ROS  Renal/GU Renal InsufficiencyRenal disease  negative genitourinary   Musculoskeletal   Abdominal   Peds negative pediatric ROS (+)  Hematology negative hematology ROS (+)   Anesthesia Other Findings   Reproductive/Obstetrics negative OB ROS                             Anesthesia Physical  Anesthesia Plan  ASA: II  Anesthesia Plan: General   Post-op Pain Management:    Induction: Intravenous  PONV Risk Score and Plan: 2 and Ondansetron, Dexamethasone and Treatment may vary due to age or medical condition  Airway Management Planned: Oral ETT  Additional Equipment:   Intra-op Plan:   Post-operative Plan: Extubation in OR  Informed Consent: I have reviewed the patients History and Physical, chart, labs and discussed the procedure including the risks, benefits and alternatives for the proposed anesthesia with the patient or authorized representative who has indicated his/her understanding and acceptance.   Dental advisory given  Plan Discussed with: CRNA  Anesthesia Plan Comments:         Anesthesia Quick Evaluation

## 2017-08-19 NOTE — Op Note (Signed)
Date of Surgery: 3.14.19  Pre-Op Diagnosis:  1. History of retinal detachment with silicon oil injection, left eye  Post-op Diagnosis:  2. same  Procedure:  1. 25 gauge Pars Plana Vitrectomy 2. Silicone Oil Removal  3. Endolaser Retinopexy  CPT Codes:  1. [id number] LT  Surgeon: [name], MD,PhD  Assistant: Catha Brow, COA   Anesthesia: General  Estimated Blood Loss <5 cc  Complications: None  Description of the Procedure:   Patient was seen in the Pre-operative area. After all remaining questions were answered, the operative LEFT EYE was marked and the informed consent was confirmed.  The patient was brought back to the operating room by the nursing staff. An additional time-out was performed. All in attendance (the nursing staff, anesthesia staff, and ophthalmology staff) agreed upon the patient, type of surgery, and the location of surgery. The operative eye was prepped and draped in sterile ophthalmic fashion.   Valved trocars were placed at the 430, 230, and 930 oclock meridians. The infusion line was brought to the operative field and found to be functional and free of air bubbles. The infusion line was inserted in the inferotemporal trocar, visualized with light pipe, and turned on. The infusion was secured with steristrips.   Next Silicone oil was removed with the extraction syringe. Multiple Air-fluid exchanges were performed with the vitrectomy cutter. Of note, when the eye was infused with air there were significant condensations noted on the posterior surface of the optic. The vitreous cutter and soft-tip extrusion cannula were used to try to clear the condensations from the posterior lens surface. Some residual silicon oil bubbles traveled to the anterior chamber, thus a 20g MVR blade was used to make a paracentesis at 11 oclock and the residual silicon oil bubbles were aspirated using the vitreous cutter. Next using the wide-field viewing system, limited posterior  vitrectomy was performed. There was negligible remaining vitreous following his two prior vitrectomies. On meticulous inspection, the retina was good position with good laser in place. The peripheral retina was inspected and found to be free of retinal tears and breaks. Next using the lighted endolaser, 1-2 rows of supplementary endolaser was placed along the posterior border of the existing laser scars. The 3 trocars were removed and sutured closed with 7.0 vicryl suture.  BSS was used to inflate the eye to physiologic pressure and to hydrate the paracentesis wound. It was found to watertight.  The conj was dehisced from prior surgery, so 7-0 vicryl was used to close the conjunctiva over the sclera and sclerotomies. Subtenon's antiobiotic and Kenalog were injected. The lid speculum was removed. Antibiotic ointment and post-operative drops were placed. The eye was doubly patched shut and a shield was taped on top. The patient tolerated the procedure well without any intraoperative or immediate postoperative complications. The patient was taken to the recovery room in good condition. The patient was instructed to maintain a strict face-down position and will be seen by Dr. Coralyn Pear tomorrow morning in clinic.

## 2017-08-19 NOTE — Anesthesia Procedure Notes (Signed)
Procedure Name: Intubation Date/Time: 08/19/2017 12:42 PM Performed by: Gwyndolyn Saxon, CRNA Pre-anesthesia Checklist: Patient identified, Emergency Drugs available, Suction available, Patient being monitored and Timeout performed Patient Re-evaluated:Patient Re-evaluated prior to induction Oxygen Delivery Method: Circle system utilized Preoxygenation: Pre-oxygenation with 100% oxygen Induction Type: IV induction Ventilation: Mask ventilation without difficulty Laryngoscope Size: Mac and 3 Grade View: Grade I Tube type: Oral Tube size: 8.0 mm Number of attempts: 1 Placement Confirmation: ETT inserted through vocal cords under direct vision,  positive ETCO2,  CO2 detector and breath sounds checked- equal and bilateral Secured at: 23 cm Tube secured with: Tape Dental Injury: Teeth and Oropharynx as per pre-operative assessment

## 2017-08-20 ENCOUNTER — Encounter (HOSPITAL_COMMUNITY): Payer: Self-pay | Admitting: Ophthalmology

## 2017-08-20 ENCOUNTER — Ambulatory Visit (INDEPENDENT_AMBULATORY_CARE_PROVIDER_SITE_OTHER): Payer: Medicare HMO | Admitting: Ophthalmology

## 2017-08-20 DIAGNOSIS — H3322 Serous retinal detachment, left eye: Secondary | ICD-10-CM

## 2017-08-20 DIAGNOSIS — Z961 Presence of intraocular lens: Secondary | ICD-10-CM

## 2017-08-20 DIAGNOSIS — H3522 Other non-diabetic proliferative retinopathy, left eye: Secondary | ICD-10-CM

## 2017-08-20 NOTE — Progress Notes (Signed)
[facility] Note  08/20/2017     CHIEF COMPLAINT Patient presents for Post-op Follow-up   HISTORY OF PRESENT ILLNESS: Jerome Mays is a 77 y.o. male who presents to the clinic today for:   HPI    Post-op Follow-up    In left eye.  Discomfort includes none.  Negative for pain, itching, foreign body sensation, tearing, discharge and floaters.  Vision is stable.  I, the attending physician,  performed the HPI with the patient and updated documentation appropriately.          Comments    POV1; S/P silicone oil removal with endo laser OS (03.14.19); Pt states he rested well; Pt states patch and shield were very bothersome yesterday; Pt denies floaters, denies flashes, denies ocular pain;        Last edited by Cherrie Gauze on 08/20/2017  8:09 AM. (History)    Pt states he feels OS VA is stable; Pt states he may be ready to have oil removed;   Referring physician: [name], MD [address] [place],  [address]  HISTORICAL INFORMATION:   Selected notes from the MEDICAL RECORD NUMBER Initial referral from Dr. [name] for RD OS s/p SBP (42 band) + 25g PPV/PFC/EL/FAX/SO OS 9.12.18 s/p PPV w/ membrane peel/relaxing retinectomy/PFC/SOX OS under GA, 10.15.18   CURRENT MEDICATIONS: Current Outpatient Medications (Ophthalmic Drugs)  Medication Sig  . bacitracin-polymyxin b (POLYSPORIN) ophthalmic ointment Place into the left eye at bedtime as needed. (Patient not taking: Reported on 08/12/2017)  . Polyethyl Glycol-Propyl Glycol (SYSTANE OP) Place 1 drop into both eyes as needed (for dry eyes).   No current facility-administered medications for this visit.  (Ophthalmic Drugs)   Current Outpatient Medications (Other)  Medication Sig  . allopurinol (ZYLOPRIM) 300 MG tablet Take 300 mg by mouth daily.   [name] aspirin EC 81 MG tablet Take 81 mg by mouth daily.  [name] DALIRESP 500 MCG TABS tablet TAKE 1 TABLET BY MOUTH ONCE DAILY (Patient not taking:  Reported on 08/13/2017)  . diphenhydramine-acetaminophen (TYLENOL PM) 25-500 MG TABS tablet Take 2 tablets by mouth at bedtime as needed (sleep).   . Fluticasone-Umeclidin-Vilant (TRELEGY ELLIPTA) 100-62.5-25 MCG/INH AEPB Inhale 1 puff into the lungs daily.  . Multiple Vitamin (ONE-A-DAY MENS PO) Take 1 tablet by mouth daily.  . predniSONE (DELTASONE) 10 MG tablet Take 4 tablets x 3 days take 3 tablets x 3 days take 2 tablets x 3 days take 1 tablet x 3 days then stop (Patient not taking: Reported on 08/12/2017)   No current facility-administered medications for this visit.  (Other)      REVIEW OF SYSTEMS: ROS    Positive for: Eyes   Negative for: Constitutional, Gastrointestinal, Neurological, Skin, Genitourinary, Musculoskeletal, HENT, Endocrine, Cardiovascular, Respiratory, Psychiatric, Allergic/Imm, Heme/Lymph   Last edited by Cherrie Gauze on 08/20/2017  7:53 AM. (History)       ALLERGIES Allergies  Allergen Reactions  . Lipitor [Atorvastatin] Rash and Other (See Comments)    Passed out    PAST MEDICAL HISTORY Past Medical History:  Diagnosis Date  . Anemia    low iron  . Arthritis   . Colonic polyp   . Constipation   . COPD (chronic obstructive pulmonary disease) ([facility])   . GERD (gastroesophageal reflux disease)   . Gout   . History of kidney stones   . Malignant tumor of kidney ([place])   . Pain    LOWER BACK AND LEFT HIP -  HX OF PREVIOUS LUMBAR AND CERVICAL SURGERY--PT STATES HE HAS HAD NUMBNESS IN BOTH FEET SINCE HIS BACK SURGERY  . Renal calculus   . Retinal detachment   . Shortness of breath    Past Surgical History:  Procedure Laterality Date  . BACK SURGERY     LUMBAR SURGERY  . CATARACT EXTRACTION Bilateral 2003   Surgeon unknown  . EYE SURGERY     BILATERAL CATARACT EXTRACTIONS  . GIVENS CAPSULE STUDY N/A 10/07/2015   Procedure: GIVENS CAPSULE STUDY;  Surgeon: Carol Ada, MD;  Location: [facility];  Service: Endoscopy;  Laterality: N/A;  . HAND  SURGERY     left  . KNEE SURGERY     right  . LAMINECTOMY     cervical  . LASER PHOTO ABLATION Left 08/19/2017   Procedure: LASER PHOTO ABLATION;  Surgeon: [name], MD;  Location: [facility];  Service: Ophthalmology;  Laterality: Left;  . MEMBRANE PEEL Left 03/22/2017   Procedure: POSSIBLE MEMBRANE PEEL WITH SILICONE OIL AND PERFLURON;  Surgeon: [name], MD;  Location: [facility];  Service: Ophthalmology;  Laterality: Left;  . NEPHRECTOMY     PT STATES RIGHT KIDNEY WAS REMOVED  . PARS PLANA VITRECTOMY Left 03/22/2017   Procedure: PARS PLANA VITRECTOMY WITH 25 GAUGE;  Surgeon: [name], MD;  Location: [facility];  Service: Ophthalmology;  Laterality: Left;  . PARS PLANA VITRECTOMY W/ SCLERAL BUCKLE Left 08/19/2017   Procedure: 65 GAUGE PARS PLANA VITRECTOMY WITH SILCONE OIL REMOVAL;  Surgeon: [name], MD;  Location: [facility];  Service: Ophthalmology;  Laterality: Left;  . 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 TOTAL KNEE ARTHROPLASTY Left 10/19/2012   Procedure: LEFT TOTAL KNEE ARTHROPLASTY;  Surgeon: Magnus Sinning, MD;  Location: [facility];  Service: Orthopedics;  Laterality: Left;  [name] VITRECTOMY 25 GAUGE WITH SCLERAL BUCKLE Left 02/17/2017   Procedure: VITRECTOMY 25 GAUGE WITH SCLERAL BUCKLE membrane peel, injection of silicone oil, and endolaser photocoagulation.;  Surgeon: [name], MD;  Location: [facility];  Service: Ophthalmology;  Laterality: Left;    FAMILY HISTORY Family History  Problem Relation Age of Onset  . Diabetes Mother   . COPD Brother   . COPD Sister   . Amblyopia Neg Hx   . Blindness Neg Hx   . Cataracts Neg Hx   . Glaucoma Neg Hx   . Macular degeneration Neg Hx   . Retinal detachment Neg Hx   . Retinitis pigmentosa Neg Hx     SOCIAL HISTORY Social History   Tobacco Use  . Smoking status: Former Smoker    Packs/day: 2.00    Years: 40.00    Pack years: 80.00    Types: Cigarettes, Pipe    Last attempt to quit: 06/09/1995    Years since quitting: 22.2  .  Smokeless tobacco: Never Used  Substance Use Topics  . Alcohol use: No  . Drug use: No         OPHTHALMIC EXAM:  Base Eye Exam    Visual Acuity (Snellen - Linear)      Right Left   Dist  20/20 HM   Dist ph  20/20 NI       Tonometry (Tonopen, 8:02 AM)      Right Left   Pressure 14 13       Pupils      Dark Light Shape React APD   Right 3 2 Round Brisk None   Left 6  Round NR None  Neuro/Psych    Oriented x3:  Yes   Mood/Affect:  Normal       Dilation    Left eye:  1.0% Mydriacyl, 2.5% Phenylephrine @ 7:59 AM        Slit Lamp and Fundus Exam    External Exam      Right Left   External Normal Periorbital edema improving       Slit Lamp Exam      Right Left   Lids/Lashes Dermatochalasis - upper lid, Telangiectasia Dermatochalasis - upper lid, Meibomian gland dysfunction, Telangiectasia, Ecchymosis   Conjunctiva/Sclera White and quiet Subconjunctival hemorrhage   Cornea Clear 3-4+ Edema, Descemet's folds   Anterior Chamber Deep and quiet Deep, 1+ cell/pigment   Iris Round and dilated Round and dilated - dilation 68m   Lens Three piece Posterior chamber intraocular lens Three piece Posterior chamber intraocular lens in good position, 1+ condensations   Vitreous Vitreous syneresis Post vitrectomy       Fundus Exam      Right Left   Disc  Normal   C/D Ratio 0.45 0.45   Macula  hazy view; flat; attached   Vessels  Vascular attenuation - mild   Periphery  moderate buckle height; temporal/inf retinectomy from 2-630 with peripheral edge attached with good laser at the edge -- single row of fresh laser posterior to old laser; trace areas of fibrosis inferiorly and inf temporal -- surrounded by good laser          IMAGING AND PROCEDURES  Imaging and Procedures for 08/20/17           ASSESSMENT/PLAN:    ICD-10-CM   1. 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 - inferior detachment from 2-10 oclock with HST at ~530 - fovea off, but sup nasal macula attached - by history RD likely started 4-5 wks prior to initial presentation, but fovea became involved ~Saturday, 97/3/41- S/p silicone oil removal with endolaser OS (03.15.19) - s/p SBP (42 band) + 25g PPV/PFC/EL/FAX/SO OS 9.12.18 - extensive PVR along inf temp arcades with tractional redetachment inf temp noted POM1 - s/p PPV w/ membrane peel/relaxing retinectomy/PFC/SOX OS under GA, 10.15.18 - now POD1 s/p PPV/SOR/EL OS, 3.14.19             - doing well this morning  - very hazy view due to corneal edema             - retina attached and in good position -- fresh laser visible along posterior edges             - IOP good             - start   PF 6x/day OD                         zymaxid QID OD                         Atropine BID OD                         Alphagan P BID OD                         PSO ung QID  OD             - eye shield when sleeping             - post op drop and positioning instructions reviewed             - tylenol/ibuprofen for pain             - f/u 1 week  3. Pseudophakia OU - IOLs in good position OU - monitor  Ophthalmic Meds Ordered this visit:  No orders of the defined types were placed in this encounter.      Return in about 1 week (around 08/27/2017) for POV.  There are no Patient Instructions on file for this visit.   Explained the diagnoses, plan, and follow up with the patient and they expressed understanding.  Patient expressed understanding of the importance of proper follow up care.    Gardiner Sleeper, M.D., Ph.D. Diseases & Surgery of the Retina and [place] 08/20/17  I have reviewed the above documentation for accuracy and completeness, and I agree with the above. Gardiner Sleeper, M.D., Ph.D. 08/20/17 8:22 AM    Abbreviations: M myopia  (nearsighted); A astigmatism; H hyperopia (farsighted); P presbyopia; Mrx spectacle prescription;  CTL contact lenses; OD right eye; OS left eye; OU both eyes  XT exotropia; ET esotropia; PEK punctate epithelial keratitis; PEE punctate epithelial erosions; DES dry eye syndrome; MGD meibomian gland dysfunction; ATs artificial tears; PFAT's preservative free artificial tears; [place] nuclear sclerotic cataract; PSC posterior subcapsular cataract; ERM epi-retinal membrane; PVD posterior vitreous detachment; RD retinal detachment; DM diabetes mellitus; DR diabetic retinopathy; NPDR non-proliferative diabetic retinopathy; PDR proliferative diabetic retinopathy; CSME clinically significant macular edema; DME diabetic macular edema; dbh dot blot hemorrhages; CWS cotton wool spot; POAG primary open angle glaucoma; C/D cup-to-disc ratio; HVF humphrey visual field; GVF goldmann visual field; OCT optical coherence tomography; IOP intraocular pressure; BRVO Branch retinal vein occlusion; CRVO central retinal vein occlusion; CRAO central retinal artery occlusion; BRAO branch retinal artery occlusion; RT retinal tear; SB scleral buckle; PPV pars plana vitrectomy; VH Vitreous hemorrhage; PRP panretinal laser photocoagulation; IVK intravitreal kenalog; VMT vitreomacular traction; MH Macular hole;  NVD neovascularization of the disc; NVE neovascularization elsewhere; AREDS age related eye disease study; ARMD age related macular degeneration; POAG primary open angle glaucoma; EBMD epithelial/anterior basement membrane dystrophy; ACIOL anterior chamber intraocular lens; IOL intraocular lens; PCIOL posterior chamber intraocular lens; Phaco/IOL phacoemulsification with intraocular lens placement; [place] photorefractive keratectomy; LASIK laser assisted in situ keratomileusis; HTN hypertension; DM diabetes mellitus; COPD chronic obstructive pulmonary disease

## 2017-08-20 NOTE — Progress Notes (Signed)
This document serves as a record of services personally performed by Gardiner Sleeper, MD, PhD. It was created on their behalf by Catha Brow, [place], a certified ophthalmic assistant. The creation of this record is the provider's dictation and/or activities during the visit.  Electronically signed by: Catha Brow, [place]  08/20/17 8:22 AM   I have reviewed the above documentation for accuracy and completeness, and I agree with the above. Gardiner Sleeper, M.D., Ph.D. 08/20/17 8:26 AM

## 2017-08-25 NOTE — Progress Notes (Signed)
[facility] Note  08/27/2017     CHIEF COMPLAINT Patient presents for Post-op Follow-up   HISTORY OF PRESENT ILLNESS: Jerome Mays is a 77 y.o. male who presents to the clinic today for:   HPI    Post-op Follow-up    In left eye.  Discomfort includes none.  Negative for pain, itching, foreign body sensation, tearing, discharge and floaters.  Vision is improved.  I, the attending physician,  performed the HPI with the patient and updated documentation appropriately.          Comments    S/P PPV/SOR/EL/OS (08/19/17). Patient states hsi vision has improved,he can fingers_0  ft, he can see images at a distance. Denies floaters, flashes and ocular pain.He is compliant with eye gtt's.       Last edited by [name], MD on 08/27/2017  8:20 AM. (History)      Referring physician: [name], MD [place], [place] [address]  HISTORICAL INFORMATION:   Selected notes from the MEDICAL RECORD NUMBER Initial referral from Dr. [name] for RD OS s/p SBP (42 band) + 25g PPV/PFC/EL/FAX/SO OS 9.12.18 s/p PPV w/ membrane peel/relaxing retinectomy/PFC/SOX OS under GA, 10.15.18   CURRENT MEDICATIONS: Current Outpatient Medications (Ophthalmic Drugs)  Medication Sig  . bacitracin-polymyxin b (POLYSPORIN) ophthalmic ointment Place into the left eye at bedtime as needed.  Vladimir Faster Glycol-Propyl Glycol (SYSTANE OP) Place 1 drop into both eyes as needed (for dry eyes).   No current facility-administered medications for this visit.  (Ophthalmic Drugs)   Current Outpatient Medications (Other)  Medication Sig  . allopurinol (ZYLOPRIM) 300 MG tablet Take 300 mg by mouth daily.   [name] aspirin EC 81 MG tablet Take 81 mg by mouth daily.  [name] DALIRESP 500 MCG TABS tablet TAKE 1 TABLET BY MOUTH ONCE DAILY  . diphenhydramine-acetaminophen (TYLENOL PM) 25-500 MG TABS tablet Take 2 tablets by mouth at bedtime as needed (sleep).   .  Fluticasone-Umeclidin-Vilant (TRELEGY ELLIPTA) 100-62.5-25 MCG/INH AEPB Inhale 1 puff into the lungs daily.  . Multiple Vitamin (ONE-A-DAY MENS PO) Take 1 tablet by mouth daily.  . predniSONE (DELTASONE) 10 MG tablet Take 4 tablets x 3 days take 3 tablets x 3 days take 2 tablets x 3 days take 1 tablet x 3 days then stop   No current facility-administered medications for this visit.  (Other)      REVIEW OF SYSTEMS: ROS    Positive for: Musculoskeletal, Eyes, Respiratory   Negative for: Constitutional, Gastrointestinal, Neurological, Skin, Genitourinary, HENT, Endocrine, Cardiovascular, Psychiatric, Allergic/Imm, Heme/Lymph   Last edited by Zenovia Jordan, LPN on 11/10/5407  8:11 AM. (History)       ALLERGIES Allergies  Allergen Reactions  . Lipitor [Atorvastatin] Rash and Other (See Comments)    Passed out    PAST MEDICAL HISTORY Past Medical History:  Diagnosis Date  . Anemia    low iron  . Arthritis   . Colonic polyp   . Constipation   . COPD (chronic obstructive pulmonary disease) ([place])   . GERD (gastroesophageal reflux disease)   . Gout   . History of kidney stones   . Malignant tumor of kidney ([place])   . Pain    LOWER BACK AND LEFT HIP - HX OF PREVIOUS LUMBAR AND CERVICAL SURGERY--PT STATES HE HAS HAD NUMBNESS IN BOTH FEET SINCE HIS BACK SURGERY  . Renal calculus   . Retinal detachment   . Shortness of breath    Past Surgical  History:  Procedure Laterality Date  . BACK SURGERY     LUMBAR SURGERY  . CATARACT EXTRACTION Bilateral 2003   Surgeon unknown  . EYE SURGERY     BILATERAL CATARACT EXTRACTIONS  . GIVENS CAPSULE STUDY N/A 10/07/2015   Procedure: GIVENS CAPSULE STUDY;  Surgeon: Carol Ada, MD;  Location: [facility];  Service: Endoscopy;  Laterality: N/A;  . HAND SURGERY     left  . KNEE SURGERY     right  . LAMINECTOMY     cervical  . LASER PHOTO ABLATION Left 08/19/2017   Procedure: LASER PHOTO ABLATION;  Surgeon: [name], MD;  Location: [facility];  Service: Ophthalmology;  Laterality: Left;  . MEMBRANE PEEL Left 03/22/2017   Procedure: POSSIBLE MEMBRANE PEEL WITH SILICONE OIL AND PERFLURON;  Surgeon: [name], MD;  Location: [facility];  Service: Ophthalmology;  Laterality: Left;  . NEPHRECTOMY     PT STATES RIGHT KIDNEY WAS REMOVED  . PARS PLANA VITRECTOMY Left 03/22/2017   Procedure: PARS PLANA VITRECTOMY WITH 25 GAUGE;  Surgeon: [name], MD;  Location: [facility];  Service: Ophthalmology;  Laterality: Left;  . PARS PLANA VITRECTOMY W/ SCLERAL BUCKLE Left 08/19/2017   Procedure: 25 GAUGE PARS PLANA VITRECTOMY WITH SILCONE OIL REMOVAL;  Surgeon: [name], MD;  Location: [facility];  Service: Ophthalmology;  Laterality: Left;  . 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 TOTAL KNEE ARTHROPLASTY Left 10/19/2012   Procedure: LEFT TOTAL KNEE ARTHROPLASTY;  Surgeon: Magnus Sinning, MD;  Location: [facility];  Service: Orthopedics;  Laterality: Left;  [name] VITRECTOMY 25 GAUGE WITH SCLERAL BUCKLE Left 02/17/2017   Procedure: VITRECTOMY 25 GAUGE WITH SCLERAL BUCKLE membrane peel, injection of silicone oil, and endolaser photocoagulation.;  Surgeon: [name], MD;  Location: [facility];  Service: Ophthalmology;  Laterality: Left;    FAMILY HISTORY Family History  Problem Relation Age of Onset  . Diabetes Mother   . COPD Brother   . COPD Sister   . Amblyopia Neg Hx   . Blindness Neg Hx   . Cataracts Neg Hx   . Glaucoma Neg Hx   . Macular degeneration Neg Hx   . Retinal detachment Neg Hx   . Retinitis pigmentosa Neg Hx     SOCIAL HISTORY Social History   Tobacco Use  . Smoking status: Former Smoker    Packs/day: 2.00    Years: 40.00    Pack years: 80.00    Types: Cigarettes, Pipe    Last attempt to quit: 06/09/1995    Years since quitting: 22.2  . Smokeless tobacco: Never Used  Substance Use Topics  . Alcohol use: No  . Drug use: No         OPHTHALMIC EXAM:  Base Eye Exam    Visual Acuity (Snellen - Linear)      Right Left    Dist [place] 20/20 20/400   Dist ph [place]  20/300 -1       Tonometry (Tonopen, 8:09 AM)      Right Left   Pressure 13 13       Pupils      Dark Light Shape React APD   Right 3 1 Round Slow None   Left 7 7 Round NR None       Visual Fields      Left Right     Full   Restrictions Partial outer superior temporal, inferior temporal deficiencies        Extraocular Movement  Right Left    Full, Ortho Full, Ortho       Neuro/Psych    Oriented x3:  Yes   Mood/Affect:  Normal       Dilation    Left eye:  1.0% Mydriacyl, 2.5% Phenylephrine @ 8:09 AM        Slit Lamp and Fundus Exam    External Exam      Right Left   External Normal Periorbital edema improving       Slit Lamp Exam      Right Left   Lids/Lashes Dermatochalasis - upper lid, Telangiectasia Dermatochalasis - upper lid, Meibomian gland dysfunction, Telangiectasia, Ecchymosis   Conjunctiva/Sclera White and quiet Sutures intact, Subconjunctival hemorrhage - improving   Cornea Clear trace Edema, 2+ Descemet's folds, 1+ Punctate epithelial erosions, mild endo pigment   Anterior Chamber Deep and quiet Deep, 0.5+ cell/pigment   Iris Round and dilated Round and dilated - dilation 75m   Lens Three piece Posterior chamber intraocular lens Three piece Posterior chamber intraocular lens in good position, micro condensations on posterior lens surface   Vitreous Vitreous syneresis Post vitrectomy       Fundus Exam      Right Left   Disc  Normal   C/D Ratio 0.45 0.45   Macula  hazy view; flat; attached   Vessels  Vascular attenuation - mild   Periphery  moderate buckle height; temporal/inf retinectomy from 2-630 with peripheral edge attached with good laser at the edge -- single row of fresh laser posterior to old laser; trace areas of fibrosis inferiorly and inf temporal -- surrounded by good laser          IMAGING AND PROCEDURES  Imaging and Procedures for 08/27/17  OCT, Retina - OU - Both Eyes       Right  Eye Quality was good. Central Foveal Thickness: 252. Progression has been stable. Findings include normal foveal contour, no IRF, no SRF.   Left Eye Quality was borderline. Central Foveal Thickness: 252. Progression has been stable. Findings include no SRF, normal foveal contour, no IRF, epiretinal membrane (Patchy ORA).   Notes Images taken, stored on drive  Diagnosis / Impression:  OD: NFP, No IRF, No SRF OS: retina attached with oil out  Clinical management:  See below  Abbreviations: NFP - Normal foveal profile. CME - cystoid macular edema. PED - pigment epithelial detachment. IRF - intraretinal fluid. SRF - subretinal fluid. EZ - ellipsoid zone. ERM - epiretinal membrane. ORA - outer retinal atrophy. ORT - outer retinal tubulation. SRHM - subretinal hyper-reflective material                  ASSESSMENT/PLAN:    ICD-10-CM   1. 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 Retinal edema H35.81 OCT, Retina - OU - Both Eyes    1,2. 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 - inferior detachment from 2-10 oclock with HST at ~530 - fovea off, but sup nasal macula attached - by history RD likely started 4-5 wks prior to initial presentation, but fovea became involved ~Saturday, 91/8/56- S/p silicone oil removal with endolaser OS (03.15.19) - s/p SBP (42 band) + 25g PPV/PFC/EL/FAX/SO OS 9.12.18 - extensive PVR along inf temp arcades with tractional redetachment inf temp noted POM1 - s/p PPV w/ membrane peel/relaxing retinectomy/PFC/SOX OS  under GA, 10.15.18 - now POW1 s/p PPV/SOR/EL OS, 3.14.19             - did well this week  - very hazy view due to corneal edema             - retina attached and in good position -- fresh laser visible along posterior edges or retina             - IOP good             - cont   PF 6x/day OS                          zymaxid QID OS-- okay to stop once bottle runs out                         Atropine BID OS-- okay to stop                         Alphagan P -- dec to QD OS                         PSO ung -- dec to QHS OS             - eye shield when sleeping             - post op drop and positioning instructions reviewed             - tylenol/ibuprofen for pain             - f/u 2-3 weeks  3. Pseudophakia OU - IOLs in good position OU - monitor  Ophthalmic Meds Ordered this visit:  No orders of the defined types were placed in this encounter.      Return in about 3 weeks (around 09/17/2017) for POV.  There are no Patient Instructions on file for this visit.   Explained the diagnoses, plan, and follow up with the patient and they expressed understanding.  Patient expressed understanding of the importance of proper follow up care.  This document serves as a record of services personally performed by Gardiner Sleeper, MD, PhD. It was created on their behalf by Catha Brow, [place], a certified ophthalmic assistant. The creation of this record is the provider's dictation and/or activities during the visit.  Electronically signed by: Catha Brow, COA  08/27/17 8:29 AM     Gardiner Sleeper, M.D., Ph.D. Diseases & Surgery of the Retina and [place] 08/27/17  I have reviewed the above documentation for accuracy and completeness, and I agree with the above. Gardiner Sleeper, M.D., Ph.D. 08/27/17 8:29 AM    Abbreviations: M myopia (nearsighted); A astigmatism; H hyperopia (farsighted); P presbyopia; Mrx spectacle prescription;  CTL contact lenses; OD right eye; OS left eye; OU both eyes  XT exotropia; ET esotropia; PEK punctate epithelial keratitis; PEE punctate epithelial erosions; DES dry eye syndrome; MGD meibomian gland dysfunction; ATs artificial tears; PFAT's preservative free artificial tears; Thor nuclear sclerotic cataract; PSC  posterior subcapsular cataract; ERM epi-retinal membrane; PVD posterior vitreous detachment; RD retinal detachment; DM diabetes mellitus; DR diabetic retinopathy; NPDR non-proliferative diabetic retinopathy; PDR proliferative diabetic retinopathy; CSME clinically significant macular edema; DME diabetic macular edema; dbh dot blot hemorrhages; CWS cotton wool spot; POAG primary open angle glaucoma; C/D cup-to-disc ratio; HVF  humphrey visual field; GVF goldmann visual field; OCT optical coherence tomography; IOP intraocular pressure; BRVO Branch retinal vein occlusion; CRVO central retinal vein occlusion; CRAO central retinal artery occlusion; BRAO branch retinal artery occlusion; RT retinal tear; SB scleral buckle; PPV pars plana vitrectomy; VH Vitreous hemorrhage; PRP panretinal laser photocoagulation; IVK intravitreal kenalog; VMT vitreomacular traction; MH Macular hole;  NVD neovascularization of the disc; NVE neovascularization elsewhere; AREDS age related eye disease study; ARMD age related macular degeneration; POAG primary open angle glaucoma; EBMD epithelial/anterior basement membrane dystrophy; ACIOL anterior chamber intraocular lens; IOL intraocular lens; PCIOL posterior chamber intraocular lens; Phaco/IOL phacoemulsification with intraocular lens placement; [place] photorefractive keratectomy; LASIK laser assisted in situ keratomileusis; HTN hypertension; DM diabetes mellitus; COPD chronic obstructive pulmonary disease

## 2017-08-27 ENCOUNTER — Ambulatory Visit (INDEPENDENT_AMBULATORY_CARE_PROVIDER_SITE_OTHER): Payer: Medicare HMO | Admitting: Ophthalmology

## 2017-08-27 ENCOUNTER — Encounter (INDEPENDENT_AMBULATORY_CARE_PROVIDER_SITE_OTHER): Payer: Self-pay | Admitting: Ophthalmology

## 2017-08-27 DIAGNOSIS — H3581 Retinal edema: Secondary | ICD-10-CM | POA: Diagnosis not present

## 2017-08-27 DIAGNOSIS — H3522 Other non-diabetic proliferative retinopathy, left eye: Secondary | ICD-10-CM

## 2017-08-27 DIAGNOSIS — Z961 Presence of intraocular lens: Secondary | ICD-10-CM

## 2017-08-27 DIAGNOSIS — H3322 Serous retinal detachment, left eye: Secondary | ICD-10-CM

## 2017-09-07 DIAGNOSIS — M19019 Primary osteoarthritis, unspecified shoulder: Secondary | ICD-10-CM | POA: Diagnosis not present

## 2017-09-07 DIAGNOSIS — M25511 Pain in right shoulder: Secondary | ICD-10-CM | POA: Diagnosis not present

## 2017-09-07 DIAGNOSIS — M19011 Primary osteoarthritis, right shoulder: Secondary | ICD-10-CM | POA: Diagnosis not present

## 2017-09-16 NOTE — Progress Notes (Signed)
[facility] Note  09/17/2017     CHIEF COMPLAINT Patient presents for Post-op Follow-up   HISTORY OF PRESENT ILLNESS: Jerome Mays is a 77 y.o. male who presents to the clinic today for:   HPI    Post-op Follow-up    In left eye.  Discomfort includes none.  Negative for pain, foreign body sensation, discharge, floaters, tearing and itching.  Vision is stable.  I, the attending physician,  performed the HPI with the patient and updated documentation appropriately.          Comments    Pt presents today for s/p PPV/SOR/EL OS (03.14.19), pt states VA is stable, but has days where it seems to be better, pt denies flashes, floaters, pain or wavy vision, pt states compliance with gtts and ointment,        Last edited by Cherrie Gauze, COA on 09/17/2017  8:39 AM. (History)      Referring physician: [name], MD [place], [place] [address]  HISTORICAL INFORMATION:   Selected notes from the MEDICAL RECORD NUMBER Initial referral from Dr. [name] for RD OS s/p SBP (42 band) + 25g PPV/PFC/EL/FAX/SO OS 9.12.18 s/p PPV w/ membrane peel/relaxing retinectomy/PFC/SOX OS under GA, 10.15.18   CURRENT MEDICATIONS: Current Outpatient Medications (Ophthalmic Drugs)  Medication Sig  . bacitracin-polymyxin b (POLYSPORIN) ophthalmic ointment Place into the left eye at bedtime as needed.  Vladimir Faster Glycol-Propyl Glycol (SYSTANE OP) Place 1 drop into both eyes as needed (for dry eyes).   No current facility-administered medications for this visit.  (Ophthalmic Drugs)   Current Outpatient Medications (Other)  Medication Sig  . allopurinol (ZYLOPRIM) 300 MG tablet Take 300 mg by mouth daily.   [name] aspirin EC 81 MG tablet Take 81 mg by mouth daily.  [name] DALIRESP 500 MCG TABS tablet TAKE 1 TABLET BY MOUTH ONCE DAILY  . diphenhydramine-acetaminophen (TYLENOL PM) 25-500 MG TABS tablet Take 2 tablets by mouth at bedtime as needed (sleep).   .  Fluticasone-Umeclidin-Vilant (TRELEGY ELLIPTA) 100-62.5-25 MCG/INH AEPB Inhale 1 puff into the lungs daily.  . Multiple Vitamin (ONE-A-DAY MENS PO) Take 1 tablet by mouth daily.  . predniSONE (DELTASONE) 10 MG tablet Take 4 tablets x 3 days take 3 tablets x 3 days take 2 tablets x 3 days take 1 tablet x 3 days then stop   No current facility-administered medications for this visit.  (Other)      REVIEW OF SYSTEMS: ROS    Positive for: Musculoskeletal, Cardiovascular, Eyes   Negative for: Constitutional, Gastrointestinal, Neurological, Skin, Genitourinary, HENT, Endocrine, Respiratory, Psychiatric, Allergic/Imm, Heme/Lymph   Last edited by Debbrah Alar, COT on 09/17/2017  7:59 AM. (History)       ALLERGIES Allergies  Allergen Reactions  . Lipitor [Atorvastatin] Rash and Other (See Comments)    Passed out    PAST MEDICAL HISTORY Past Medical History:  Diagnosis Date  . Anemia    low iron  . Arthritis   . Colonic polyp   . Constipation   . COPD (chronic obstructive pulmonary disease) ([place])   . GERD (gastroesophageal reflux disease)   . Gout   . History of kidney stones   . Malignant tumor of kidney ([place])   . Pain    LOWER BACK AND LEFT HIP - HX OF PREVIOUS LUMBAR AND CERVICAL SURGERY--PT STATES HE HAS HAD NUMBNESS IN BOTH FEET SINCE HIS BACK SURGERY  . Renal calculus   . Retinal detachment   .  Shortness of breath    Past Surgical History:  Procedure Laterality Date  . BACK SURGERY     LUMBAR SURGERY  . CATARACT EXTRACTION Bilateral 2003   Surgeon unknown  . EYE SURGERY     BILATERAL CATARACT EXTRACTIONS  . GIVENS CAPSULE STUDY N/A 10/07/2015   Procedure: GIVENS CAPSULE STUDY;  Surgeon: Carol Ada, MD;  Location: [facility];  Service: Endoscopy;  Laterality: N/A;  . HAND SURGERY     left  . KNEE SURGERY     right  . LAMINECTOMY     cervical  . LASER PHOTO ABLATION Left 08/19/2017   Procedure: LASER PHOTO ABLATION;  Surgeon: [name], MD;  Location: [facility];  Service: Ophthalmology;  Laterality: Left;  . MEMBRANE PEEL Left 03/22/2017   Procedure: POSSIBLE MEMBRANE PEEL WITH SILICONE OIL AND PERFLURON;  Surgeon: [name], MD;  Location: [facility];  Service: Ophthalmology;  Laterality: Left;  . NEPHRECTOMY     PT STATES RIGHT KIDNEY WAS REMOVED  . PARS PLANA VITRECTOMY Left 03/22/2017   Procedure: PARS PLANA VITRECTOMY WITH 25 GAUGE;  Surgeon: [name], MD;  Location: [facility];  Service: Ophthalmology;  Laterality: Left;  . PARS PLANA VITRECTOMY W/ SCLERAL BUCKLE Left 08/19/2017   Procedure: 11 GAUGE PARS PLANA VITRECTOMY WITH SILCONE OIL REMOVAL;  Surgeon: [name], MD;  Location: [facility];  Service: Ophthalmology;  Laterality: Left;  . 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 TOTAL KNEE ARTHROPLASTY Left 10/19/2012   Procedure: LEFT TOTAL KNEE ARTHROPLASTY;  Surgeon: Magnus Sinning, MD;  Location: [facility];  Service: Orthopedics;  Laterality: Left;  [name] VITRECTOMY 25 GAUGE WITH SCLERAL BUCKLE Left 02/17/2017   Procedure: VITRECTOMY 25 GAUGE WITH SCLERAL BUCKLE membrane peel, injection of silicone oil, and endolaser photocoagulation.;  Surgeon: [name], MD;  Location: [facility];  Service: Ophthalmology;  Laterality: Left;    FAMILY HISTORY Family History  Problem Relation Age of Onset  . Diabetes Mother   . COPD Brother   . COPD Sister   . Amblyopia Neg Hx   . Blindness Neg Hx   . Cataracts Neg Hx   . Glaucoma Neg Hx   . Macular degeneration Neg Hx   . Retinal detachment Neg Hx   . Retinitis pigmentosa Neg Hx     SOCIAL HISTORY Social History   Tobacco Use  . Smoking status: Former Smoker    Packs/day: 2.00    Years: 40.00    Pack years: 80.00    Types: Cigarettes, Pipe    Last attempt to quit: 06/09/1995    Years since quitting: 22.2  . Smokeless tobacco: Never Used  Substance Use Topics  . Alcohol use: No  . Drug use: No         OPHTHALMIC EXAM:  Base Eye Exam    Visual Acuity (Snellen - Linear)      Right Left    Dist [place] 20/20 -1 20/200 -1   Dist ph [place] NI NI       Tonometry (Tonopen, 8:05 AM)      Right Left   Pressure 13 13       Pupils      Dark Light Shape React APD   Right 2 1.5 Round Minimal None   Left 5 5 Round NR None       Visual Fields (Counting fingers)      Left Right    Full Full       Extraocular Movement  Right Left    Full, Ortho Full, Ortho       Neuro/Psych    Oriented x3:  Yes   Mood/Affect:  Normal       Dilation    Left eye:  1.0% Mydriacyl, 2.5% Phenylephrine @ 8:05 AM        Slit Lamp and Fundus Exam    External Exam      Right Left   External Normal Periorbital edema improving       Slit Lamp Exam      Right Left   Lids/Lashes Dermatochalasis - upper lid, Telangiectasia Dermatochalasis - upper lid, Meibomian gland dysfunction, Telangiectasia, Ecchymosis   Conjunctiva/Sclera White and quiet Sutures intact, no injection   Cornea Clear trace Edema, 1+ Descemet's folds, 1+ Punctate epithelial erosions, mild endo pigment   Anterior Chamber Deep and quiet Deep, 0.5+ cell/pigment   Iris Round and dilated Round and dilated - dilation 74m   Lens Three piece Posterior chamber intraocular lens Three piece Posterior chamber intraocular lens in good position, micro condensations on posterior lens surface   Vitreous Vitreous syneresis Post vitrectomy       Fundus Exam      Right Left   Disc  Normal   C/D Ratio 0.45 0.45   Macula  hazy view; flat; attached   Vessels  Vascular attenuation - mild   Periphery  moderate buckle height; temporal/inf retinectomy from 2-630 with peripheral edge attached with good laser at the edge -- single row of fresh laser posterior to old laser; trace areas of fibrosis inferiorly and inf temporal -- surrounded by good laser          IMAGING AND PROCEDURES  Imaging and Procedures for 09/19/17  OCT, Retina - OU - Both Eyes       Right Eye Quality was good. Central Foveal Thickness: 245. Progression has been  stable. Findings include normal foveal contour, no IRF, no SRF.   Left Eye Quality was borderline. Central Foveal Thickness: 252. Progression has been stable. Findings include no SRF, normal foveal contour, no IRF, epiretinal membrane (Patchy ORA).   Notes Images taken, stored on drive  Diagnosis / Impression:  OD: NFP, No IRF, No SRF OS: retina attached with oil out  Clinical management:  See below  Abbreviations: NFP - Normal foveal profile. CME - cystoid macular edema. PED - pigment epithelial detachment. IRF - intraretinal fluid. SRF - subretinal fluid. EZ - ellipsoid zone. ERM - epiretinal membrane. ORA - outer retinal atrophy. ORT - outer retinal tubulation. SRHM - subretinal hyper-reflective material                  ASSESSMENT/PLAN:    ICD-10-CM   1. 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 Retinal edema H35.81 OCT, Retina - OU - Both Eyes    1,2. 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 - inferior detachment from 2-10 oclock with HST at ~530 - fovea off, but sup nasal macula attached - by history RD likely started 4-5 wks prior to initial presentation, but fovea became involved ~Saturday, 99/1/63- S/p silicone oil removal with endolaser OS (03.15.19) - s/p SBP (42 band) + 25g PPV/PFC/EL/FAX/SO OS 9.12.18 - extensive PVR along inf temp arcades with tractional redetachment inf temp noted POM1 - s/p PPV w/ membrane peel/relaxing retinectomy/PFC/SOX OS under GA, 10.15.18 - now POW4 s/p PPV/SOR/EL  OS, 3.14.19             - doing well  - hazy view due to corneal edema -- improving but persistent             - retina attached and in good position             - IOP 13, today             - cont   PF 6x/day OS                         Alphagan P BID OS                         PSO ung -- dec to QHS / PRN OS             - d/c eye shield when  sleeping             - post op drop and positioning instructions reviewed             - f/u 4 weeks  3. Pseudophakia OU - IOLs in good position OU - monitor  Ophthalmic Meds Ordered this visit:  No orders of the defined types were placed in this encounter.      Return in about 6 weeks (around 10/29/2017) for POV.  There are no Patient Instructions on file for this visit.   Explained the diagnoses, plan, and follow up with the patient and they expressed understanding.  Patient expressed understanding of the importance of proper follow up care.  This document serves as a record of services personally performed by Gardiner Sleeper, MD, PhD. It was created on their behalf by Catha Brow, [place], a certified ophthalmic assistant. The creation of this record is the provider's dictation and/or activities during the visit.  Electronically signed by: Catha Brow, COA  09/19/17 1:26 AM     Gardiner Sleeper, M.D., Ph.D. Diseases & Surgery of the Retina and [place] 09/19/17  I have reviewed the above documentation for accuracy and completeness, and I agree with the above. Gardiner Sleeper, M.D., Ph.D. 09/19/17 1:26 AM    Abbreviations: M myopia (nearsighted); A astigmatism; H hyperopia (farsighted); P presbyopia; Mrx spectacle prescription;  CTL contact lenses; OD right eye; OS left eye; OU both eyes  XT exotropia; ET esotropia; PEK punctate epithelial keratitis; PEE punctate epithelial erosions; DES dry eye syndrome; MGD meibomian gland dysfunction; ATs artificial tears; PFAT's preservative free artificial tears; [place] nuclear sclerotic cataract; PSC posterior subcapsular cataract; ERM epi-retinal membrane; PVD posterior vitreous detachment; RD retinal detachment; DM diabetes mellitus; DR diabetic retinopathy; NPDR non-proliferative diabetic retinopathy; PDR proliferative diabetic retinopathy; CSME clinically significant macular edema; DME diabetic  macular edema; dbh dot blot hemorrhages; CWS cotton wool spot; POAG primary open angle glaucoma; C/D cup-to-disc ratio; HVF humphrey visual field; GVF goldmann visual field; OCT optical coherence tomography; IOP intraocular pressure; BRVO Branch retinal vein occlusion; CRVO central retinal vein occlusion; CRAO central retinal artery occlusion; BRAO branch retinal artery occlusion; RT retinal tear; SB scleral buckle; PPV pars plana vitrectomy; VH Vitreous hemorrhage; PRP panretinal laser photocoagulation; IVK intravitreal kenalog; VMT vitreomacular traction; MH Macular hole;  NVD neovascularization of the disc; NVE neovascularization elsewhere; AREDS age related eye disease study; ARMD age related macular degeneration; POAG primary open angle glaucoma; EBMD epithelial/anterior basement membrane dystrophy; ACIOL anterior chamber intraocular lens;  IOL intraocular lens; PCIOL posterior chamber intraocular lens; Phaco/IOL phacoemulsification with intraocular lens placement; [place] photorefractive keratectomy; LASIK laser assisted in situ keratomileusis; HTN hypertension; DM diabetes mellitus; COPD chronic obstructive pulmonary disease

## 2017-09-17 ENCOUNTER — Ambulatory Visit (INDEPENDENT_AMBULATORY_CARE_PROVIDER_SITE_OTHER): Payer: Medicare HMO | Admitting: Ophthalmology

## 2017-09-17 ENCOUNTER — Encounter (INDEPENDENT_AMBULATORY_CARE_PROVIDER_SITE_OTHER): Payer: Self-pay | Admitting: Ophthalmology

## 2017-09-17 DIAGNOSIS — H3322 Serous retinal detachment, left eye: Secondary | ICD-10-CM | POA: Diagnosis not present

## 2017-09-17 DIAGNOSIS — H3522 Other non-diabetic proliferative retinopathy, left eye: Secondary | ICD-10-CM

## 2017-09-17 DIAGNOSIS — H3581 Retinal edema: Secondary | ICD-10-CM

## 2017-09-17 DIAGNOSIS — Z961 Presence of intraocular lens: Secondary | ICD-10-CM

## 2017-09-28 DIAGNOSIS — M25511 Pain in right shoulder: Secondary | ICD-10-CM | POA: Diagnosis not present

## 2017-10-19 ENCOUNTER — Telehealth: Payer: Self-pay | Admitting: Pulmonary Disease

## 2017-10-19 MED ORDER — PREDNISONE 10 MG PO TABS: ORAL_TABLET | ORAL | 0 refills | Status: DC

## 2017-10-19 MED ORDER — AZITHROMYCIN 250 MG PO TABS: ORAL_TABLET | ORAL | 0 refills | Status: DC

## 2017-10-19 NOTE — Telephone Encounter (Signed)
Pt aware of recs.  rx's sent to preferred pharmacy.  Nothing further needed.  

## 2017-10-19 NOTE — Telephone Encounter (Signed)
Call in z pack and pred taper starting at 40 mg. Reduce dose by 10 mg every 3 days

## 2017-10-19 NOTE — Telephone Encounter (Signed)
Spoke with pt, c/o increased sob, chest congestion, tightness, prod cough with green mucus X1 week.  Denies sinus congestion, fever, PND, chills.   Has been taking mucinex to help with s/s, requesting additonal recs.   Pharmacy: [facility] [place]   Dr Vaughan Browner please advise on recs.  Thanks.

## 2017-10-20 NOTE — Progress Notes (Addendum)
[facility] Note  10/21/2017     CHIEF COMPLAINT Patient presents for Post-op Follow-up   HISTORY OF PRESENT ILLNESS: Jerome Mays is a 77 y.o. male who presents to the clinic today for:   HPI    Post-op Follow-up    In left eye.  Discomfort includes none.  Negative for pain, itching, foreign body sensation, tearing, discharge and floaters.  Vision is stable.  I, the attending physician,  performed the HPI with the patient and updated documentation appropriately.          Comments    S/P PPV/SOR/EL OS (03.14.19); Pt states OS VA is stable; Pt states OS is comfortable; Pt denies floaters, denies flashes, denies wavy VA; Pt reports using PF OS 6x/daily, alphagan OS BID as directed;        Last edited by [name], MD on 10/21/2017  8:13 AM. (History)      Referring physician: [name], MD [place], [place] [address]  HISTORICAL INFORMATION:   Selected notes from the MEDICAL RECORD NUMBER Initial referral from Dr. [name] for RD OS s/p SBP (42 band) + 25g PPV/PFC/EL/FAX/SO OS 9.12.18 s/p PPV w/ membrane peel/relaxing retinectomy/PFC/SOX OS under GA, 10.15.18   CURRENT MEDICATIONS: Current Outpatient Medications (Ophthalmic Drugs)  Medication Sig  . bacitracin-polymyxin b (POLYSPORIN) ophthalmic ointment Place into the left eye at bedtime as needed.  Vladimir Faster Glycol-Propyl Glycol (SYSTANE OP) Place 1 drop into both eyes as needed (for dry eyes).   No current facility-administered medications for this visit.  (Ophthalmic Drugs)   Current Outpatient Medications (Other)  Medication Sig  . allopurinol (ZYLOPRIM) 300 MG tablet Take 300 mg by mouth daily.   [name] aspirin EC 81 MG tablet Take 81 mg by mouth daily.  [name] azithromycin (ZITHROMAX Z-PAK) 250 MG tablet 2 tabs today, then 1 daily until gone.  [name] DALIRESP 500 MCG TABS tablet TAKE 1 TABLET BY MOUTH ONCE DAILY  . diphenhydramine-acetaminophen (TYLENOL PM) 25-500 MG TABS  tablet Take 2 tablets by mouth at bedtime as needed (sleep).   . Fluticasone-Umeclidin-Vilant (TRELEGY ELLIPTA) 100-62.5-25 MCG/INH AEPB Inhale 1 puff into the lungs daily.  . Multiple Vitamin (ONE-A-DAY MENS PO) Take 1 tablet by mouth daily.  . predniSONE (DELTASONE) 10 MG tablet 38mX3 days, 332mX3 days, 2079m3 days, 3m60mdays, then stop.   No current facility-administered medications for this visit.  (Other)      REVIEW OF SYSTEMS: ROS    Positive for: Musculoskeletal, Eyes, Respiratory   Negative for: Constitutional, Gastrointestinal, Neurological, Skin, Genitourinary, HENT, Endocrine, Cardiovascular, Psychiatric, Allergic/Imm, Heme/Lymph   Last edited by FabiCherrie GauzeA on 10/21/2017  7:57 AM. (History)       ALLERGIES Allergies  Allergen Reactions  . Lipitor [Atorvastatin] Rash and Other (See Comments)    Passed out    PAST MEDICAL HISTORY Past Medical History:  Diagnosis Date  . Anemia    low iron  . Arthritis   . Colonic polyp   . Constipation   . COPD (chronic obstructive pulmonary disease) (HCC)[place]. GERD (gastroesophageal reflux disease)   . Gout   . History of kidney stones   . Malignant tumor of kidney (HCC)[place]. Pain    LOWER BACK AND LEFT HIP - HX OF PREVIOUS LUMBAR AND CERVICAL SURGERY--PT STATES HE HAS HAD NUMBNESS IN BOTH FEET SINCE HIS BACK SURGERY  . Renal calculus   . Retinal detachment   .  Shortness of breath    Past Surgical History:  Procedure Laterality Date  . BACK SURGERY     LUMBAR SURGERY  . CATARACT EXTRACTION Bilateral 2003   Surgeon unknown  . EYE SURGERY     BILATERAL CATARACT EXTRACTIONS  . GIVENS CAPSULE STUDY N/A 10/07/2015   Procedure: GIVENS CAPSULE STUDY;  Surgeon: Carol Ada, MD;  Location: [facility];  Service: Endoscopy;  Laterality: N/A;  . HAND SURGERY     left  . KNEE SURGERY     right  . LAMINECTOMY     cervical  . LASER PHOTO ABLATION Left 08/19/2017   Procedure: LASER PHOTO ABLATION;  Surgeon:  [name], MD;  Location: [facility];  Service: Ophthalmology;  Laterality: Left;  . MEMBRANE PEEL Left 03/22/2017   Procedure: POSSIBLE MEMBRANE PEEL WITH SILICONE OIL AND PERFLURON;  Surgeon: [name], MD;  Location: [facility];  Service: Ophthalmology;  Laterality: Left;  . NEPHRECTOMY     PT STATES RIGHT KIDNEY WAS REMOVED  . PARS PLANA VITRECTOMY Left 03/22/2017   Procedure: PARS PLANA VITRECTOMY WITH 25 GAUGE;  Surgeon: [name], MD;  Location: [facility];  Service: Ophthalmology;  Laterality: Left;  . PARS PLANA VITRECTOMY W/ SCLERAL BUCKLE Left 08/19/2017   Procedure: 13 GAUGE PARS PLANA VITRECTOMY WITH SILCONE OIL REMOVAL;  Surgeon: [name], MD;  Location: [facility];  Service: Ophthalmology;  Laterality: Left;  . 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 TOTAL KNEE ARTHROPLASTY Left 10/19/2012   Procedure: LEFT TOTAL KNEE ARTHROPLASTY;  Surgeon: Magnus Sinning, MD;  Location: [facility];  Service: Orthopedics;  Laterality: Left;  [name] VITRECTOMY 25 GAUGE WITH SCLERAL BUCKLE Left 02/17/2017   Procedure: VITRECTOMY 25 GAUGE WITH SCLERAL BUCKLE membrane peel, injection of silicone oil, and endolaser photocoagulation.;  Surgeon: [name], MD;  Location: [facility];  Service: Ophthalmology;  Laterality: Left;    FAMILY HISTORY Family History  Problem Relation Age of Onset  . Diabetes Mother   . COPD Brother   . COPD Sister   . Amblyopia Neg Hx   . Blindness Neg Hx   . Cataracts Neg Hx   . Glaucoma Neg Hx   . Macular degeneration Neg Hx   . Retinal detachment Neg Hx   . Retinitis pigmentosa Neg Hx     SOCIAL HISTORY Social History   Tobacco Use  . Smoking status: Former Smoker    Packs/day: 2.00    Years: 40.00    Pack years: 80.00    Types: Cigarettes, Pipe    Last attempt to quit: 06/09/1995    Years since quitting: 22.3  . Smokeless tobacco: Never Used  Substance Use Topics  . Alcohol use: No  . Drug use: No         OPHTHALMIC EXAM:  Base Eye Exam    Visual Acuity  (Snellen - Linear)      Right Left   Dist [place] 20/20 20/150   Dist ph [place] 20/15 NI       Tonometry (Tonopen, 8:07 AM)      Right Left   Pressure 16 12       Pupils      Dark Light Shape React APD   Right 5 2 Round Brisk None   Left 6  Round Minimal Trace       Visual Fields (Counting fingers)      Left Right     Full   Restrictions Partial outer superior temporal, inferior temporal deficiencies  Extraocular Movement      Right Left    Full, Ortho Full, Ortho       Neuro/Psych    Oriented x3:  Yes   Mood/Affect:  Normal       Dilation    Left eye:  1.0% Mydriacyl, 2.5% Phenylephrine @ 8:07 AM        Slit Lamp and Fundus Exam    External Exam      Right Left   External Normal Periorbital edema improving       Slit Lamp Exam      Right Left   Lids/Lashes Dermatochalasis - upper lid, Telangiectasia Dermatochalasis - upper lid, Meibomian gland dysfunction, Telangiectasia, Ecchymosis   Conjunctiva/Sclera White and quiet Sutures intact, no injection   Cornea Clear 1+ Edema, 1-2+ Descemet's folds   Anterior Chamber Deep and quiet Deep, no cell/pigment   Iris Round and dilated Round and dilated - dilation 32m   Lens Three piece Posterior chamber intraocular lens Three piece Posterior chamber intraocular lens in good position, micro condensations on posterior lens surface   Vitreous Vitreous syneresis Post vitrectomy       Fundus Exam      Right Left   Disc  Normal   C/D Ratio 0.45 0.45   Macula  slightly hazy view; flat; attached   Vessels  Vascular attenuation - mild   Periphery  moderate buckle height; temporal/inf retinectomy from 2-630 with peripheral edge attached with good laser at the edge -- single row of fresh laser posterior to old laser; trace areas of fibrosis inferiorly and inf temporal -- surrounded by good laser          IMAGING AND PROCEDURES  Imaging and Procedures for 09/19/17  OCT, Retina - OU - Both Eyes       Right Eye Quality  was good. Central Foveal Thickness: 252. Progression has been stable. Findings include normal foveal contour, no IRF, no SRF.   Left Eye Quality was good. Central Foveal Thickness: 335. Progression has been stable. Findings include no SRF, normal foveal contour, no IRF, epiretinal membrane (Patchy ORA).   Notes Images taken, stored on drive  Diagnosis / Impression:  OD: NFP, No IRF, No SRF OS: retina attached with oil out  Clinical management:  See below  Abbreviations: NFP - Normal foveal profile. CME - cystoid macular edema. PED - pigment epithelial detachment. IRF - intraretinal fluid. SRF - subretinal fluid. EZ - ellipsoid zone. ERM - epiretinal membrane. ORA - outer retinal atrophy. ORT - outer retinal tubulation. SRHM - subretinal hyper-reflective material                  ASSESSMENT/PLAN:    ICD-10-CM   1. 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 Retinal edema H35.81 OCT, Retina - OU - Both Eyes    1,2. 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 - inferior detachment from 2-10 oclock with HST at ~530 - fovea off, but sup nasal macula attached - by history RD likely started 4-5 wks prior to initial presentation, but fovea became involved ~Saturday, 92/0/94- S/p silicone oil removal with endolaser OS (03.15.19) - s/p SBP (42 band) + 25g PPV/PFC/EL/FAX/SO OS 9.12.18 - extensive PVR along inf temp arcades with tractional redetachment inf temp noted POM1 - s/p PPV w/ membrane peel/relaxing retinectomy/PFC/SOX OS under GA, 10.15.18 - now POW9 s/p PPV/SOR/EL OS, 3.14.19             -  doing well  - hazy view due to corneal edema -- persistent             - retina attached and in good position             - IOP 12 today             - begin PF taper -- 4,3,2 drops daily, decrease weekly; stay at BID until next visit   - stop Alphagan P BID OS             - cont PSO ung -- dec to  QHS / PRN OS             - f/u 4 weeks  3. Pseudophakia OU - IOLs in good position OU - monitor  Ophthalmic Meds Ordered this visit:  No orders of the defined types were placed in this encounter.      Return in about 1 month (around 11/18/2017) for POV and K check.  There are no Patient Instructions on file for this visit.   Explained the diagnoses, plan, and follow up with the patient and they expressed understanding.  Patient expressed understanding of the importance of proper follow up care.  This document serves as a record of services personally performed by Gardiner Sleeper, MD, PhD. It was created on their behalf by Catha Brow, [place], a certified ophthalmic assistant. The creation of this record is the provider's dictation and/or activities during the visit.  Electronically signed by: Catha Brow, COA  05.15.19 8:39 AM    Gardiner Sleeper, M.D., Ph.D. Diseases & Surgery of the Retina and [place] 10/21/17  I have reviewed the above documentation for accuracy and completeness, and I agree with the above. Gardiner Sleeper, M.D., Ph.D. 10/21/17 8:39 AM    Abbreviations: M myopia (nearsighted); A astigmatism; H hyperopia (farsighted); P presbyopia; Mrx spectacle prescription;  CTL contact lenses; OD right eye; OS left eye; OU both eyes  XT exotropia; ET esotropia; PEK punctate epithelial keratitis; PEE punctate epithelial erosions; DES dry eye syndrome; MGD meibomian gland dysfunction; ATs artificial tears; PFAT's preservative free artificial tears; [place] nuclear sclerotic cataract; PSC posterior subcapsular cataract; ERM epi-retinal membrane; PVD posterior vitreous detachment; RD retinal detachment; DM diabetes mellitus; DR diabetic retinopathy; NPDR non-proliferative diabetic retinopathy; PDR proliferative diabetic retinopathy; CSME clinically significant macular edema; DME diabetic macular edema; dbh dot blot hemorrhages; CWS cotton wool  spot; POAG primary open angle glaucoma; C/D cup-to-disc ratio; HVF humphrey visual field; GVF goldmann visual field; OCT optical coherence tomography; IOP intraocular pressure; BRVO Branch retinal vein occlusion; CRVO central retinal vein occlusion; CRAO central retinal artery occlusion; BRAO branch retinal artery occlusion; RT retinal tear; SB scleral buckle; PPV pars plana vitrectomy; VH Vitreous hemorrhage; PRP panretinal laser photocoagulation; IVK intravitreal kenalog; VMT vitreomacular traction; MH Macular hole;  NVD neovascularization of the disc; NVE neovascularization elsewhere; AREDS age related eye disease study; ARMD age related macular degeneration; POAG primary open angle glaucoma; EBMD epithelial/anterior basement membrane dystrophy; ACIOL anterior chamber intraocular lens; IOL intraocular lens; PCIOL posterior chamber intraocular lens; Phaco/IOL phacoemulsification with intraocular lens placement; [place] photorefractive keratectomy; LASIK laser assisted in situ keratomileusis; HTN hypertension; DM diabetes mellitus; COPD chronic obstructive pulmonary disease

## 2017-10-21 ENCOUNTER — Encounter (INDEPENDENT_AMBULATORY_CARE_PROVIDER_SITE_OTHER): Payer: Self-pay | Admitting: Ophthalmology

## 2017-10-21 ENCOUNTER — Ambulatory Visit (INDEPENDENT_AMBULATORY_CARE_PROVIDER_SITE_OTHER): Payer: Medicare HMO | Admitting: Ophthalmology

## 2017-10-21 DIAGNOSIS — H3522 Other non-diabetic proliferative retinopathy, left eye: Secondary | ICD-10-CM

## 2017-10-21 DIAGNOSIS — H3322 Serous retinal detachment, left eye: Secondary | ICD-10-CM

## 2017-10-21 DIAGNOSIS — H3581 Retinal edema: Secondary | ICD-10-CM | POA: Diagnosis not present

## 2017-10-21 DIAGNOSIS — Z961 Presence of intraocular lens: Secondary | ICD-10-CM

## 2017-11-02 ENCOUNTER — Ambulatory Visit: Payer: Medicare HMO | Admitting: Pulmonary Disease

## 2017-11-02 ENCOUNTER — Encounter: Payer: Self-pay | Admitting: Pulmonary Disease

## 2017-11-02 VITALS — BP 142/60 | HR 65 | Ht 72.0 in | Wt 192.4 lb

## 2017-11-02 DIAGNOSIS — J449 Chronic obstructive pulmonary disease, unspecified: Secondary | ICD-10-CM

## 2017-11-02 NOTE — Patient Instructions (Addendum)
I am glad your breathing is doing better after the antibiotics and prednisone Continue your inhalers Follow-up in 6 months.

## 2017-11-02 NOTE — Progress Notes (Signed)
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 He's been maintained on Advair and Daliresp. He's had frequent exacerbations in the past but these have improved after the Daliresp was added.   He has had symptoms of cough, chest tightness, congestion for the past week after he was exposed to pollen and dust while mowing hay. He denies any fevers, chills, purulent sputum. He feels that the Daliresp is affecting his sleep and he is taking zzquil at night to fall asleep. He had an evaluation in January 2018 for minor hemoptysis. He had a CT scan of the chest which did not show any obvious source of hemoptysis.   He was previously on Daliresp.  Taken off it in 2018 as he cannot afford the cost.  Interim History: Had a minor exacerbation earlier this month.  Prednisone and back was called in by the office. States that his cough and sputum production have improved since the treatment.  Breathing is back at baseline with chronic dyspnea on exertion.  Outpatient Encounter Medications as of 11/02/2017  Medication Sig  . allopurinol (ZYLOPRIM) 300 MG tablet Take 300 mg by mouth daily.   [name] aspirin EC 81 MG tablet Take 81 mg by mouth daily.  . diphenhydramine-acetaminophen (TYLENOL PM) 25-500 MG TABS tablet Take 2 tablets by mouth at bedtime as needed (sleep).   . Fluticasone-Umeclidin-Vilant (TRELEGY ELLIPTA) 100-62.5-25 MCG/INH AEPB Inhale 1 puff into the lungs daily.  . Multiple Vitamin (ONE-A-DAY MENS PO) Take 1 tablet by mouth daily.  Vladimir Faster Glycol-Propyl Glycol (SYSTANE OP) Place 1 drop into both eyes as needed (for dry eyes).  . [DISCONTINUED] azithromycin (ZITHROMAX Z-PAK) 250 MG tablet 2 tabs today, then 1 daily until gone.    . [DISCONTINUED] bacitracin-polymyxin b (POLYSPORIN) ophthalmic ointment Place into the left eye at bedtime as needed.  . [DISCONTINUED] DALIRESP 500 MCG TABS tablet TAKE 1 TABLET BY MOUTH ONCE DAILY  . [DISCONTINUED] predniSONE (DELTASONE) 10 MG tablet 40mg X3 days, 30mg  X3 days, 20mg  X3 days, 10mg X3 days, then stop.   No facility-administered encounter medications on file as of 11/02/2017.     Allergies as of 11/02/2017 - Review Complete 11/02/2017  Allergen Reaction Noted  . Lipitor [atorvastatin] Rash and Other (See Comments)      Past Medical History:  Diagnosis Date  . Anemia    low iron  . Arthritis   . Colonic polyp   . Constipation   . COPD (chronic obstructive pulmonary disease) ([place])   . GERD (gastroesophageal reflux disease)   . Gout   . History of kidney stones   . Malignant tumor of kidney ([place])   . Pain    LOWER BACK AND LEFT HIP - HX OF PREVIOUS LUMBAR AND CERVICAL SURGERY--PT STATES HE HAS HAD NUMBNESS IN BOTH FEET SINCE HIS BACK SURGERY  . Renal calculus   . Retinal detachment   . Shortness of breath     Past Surgical History:  Procedure Laterality Date  . BACK SURGERY     LUMBAR SURGERY  . CATARACT EXTRACTION Bilateral 2003   Surgeon unknown  . EYE SURGERY     BILATERAL CATARACT EXTRACTIONS  . GIVENS CAPSULE STUDY N/A 10/07/2015  Procedure: GIVENS CAPSULE STUDY;  Surgeon: Carol Ada, MD;  [facility];  Service: Endoscopy;  Laterality: N/A;  . HAND SURGERY     left  . KNEE SURGERY     right  . LAMINECTOMY     cervical  . LASER PHOTO ABLATION Left 08/19/2017   Procedure: LASER PHOTO ABLATION;  Surgeon: [name], MD;  Location: [facility];  Service: Ophthalmology;  Laterality: Left;  . MEMBRANE PEEL Left 03/22/2017   Procedure: POSSIBLE MEMBRANE PEEL WITH SILICONE OIL AND PERFLURON;  Surgeon: [name], MD;  Location: [facility];  Service: Ophthalmology;  Laterality: Left;  . NEPHRECTOMY     PT STATES RIGHT KIDNEY WAS REMOVED  . PARS  PLANA VITRECTOMY Left 03/22/2017   Procedure: PARS PLANA VITRECTOMY WITH 25 GAUGE;  Surgeon: [name], MD;  Location: [facility];  Service: Ophthalmology;  Laterality: Left;  . PARS PLANA VITRECTOMY W/ SCLERAL BUCKLE Left 08/19/2017   Procedure: 98 GAUGE PARS PLANA VITRECTOMY WITH SILCONE OIL REMOVAL;  Surgeon: [name], MD;  Location: [facility];  Service: Ophthalmology;  Laterality: Left;  . 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 TOTAL KNEE ARTHROPLASTY Left 10/19/2012   Procedure: LEFT TOTAL KNEE ARTHROPLASTY;  Surgeon: Magnus Sinning, MD;  [facility];  Service: Orthopedics;  Laterality: Left;  [name] VITRECTOMY 25 GAUGE WITH SCLERAL BUCKLE Left 02/17/2017   Procedure: VITRECTOMY 25 GAUGE WITH SCLERAL BUCKLE membrane peel, injection of silicone oil, and endolaser photocoagulation.;  Surgeon: [name], MD;  Location: [facility];  Service: Ophthalmology;  Laterality: Left;    Family History  Problem Relation Age of Onset  . Diabetes Mother   . COPD Brother   . COPD Sister   . Amblyopia Neg Hx   . Blindness Neg Hx   . Cataracts Neg Hx   . Glaucoma Neg Hx   . Macular degeneration Neg Hx   . Retinal detachment Neg Hx   . Retinitis pigmentosa Neg Hx     Social History   Socioeconomic History  . Marital status: Married    Spouse name: Not on file  . Number of children: Not on file  . Years of education: Not on file  . Highest education level: Not on file  Occupational History  . Not on file  Social Needs  . Financial resource strain: Not on file  . Food insecurity:    Worry: Not on file    Inability: Not on file  . Transportation needs:    Medical: Not on file    Non-medical: Not on file  Tobacco Use  . Smoking status: Former Smoker    Packs/day: 2.00    Years: 40.00    Pack years: 80.00    Types: Cigarettes, Pipe    Last attempt to quit: 06/09/1995    Years since quitting: 22.4  . Smokeless tobacco: Never Used  Substance and Sexual Activity  . Alcohol use: No  . Drug  use: No  . Sexual activity: Not on file  Lifestyle  . Physical activity:    Days per week: Not on file    Minutes per session: Not on file  . Stress: Not on file  Relationships  . Social connections:    Talks on phone: Not on file    Gets together: Not on file    Attends religious service: Not on file    Active member of club or organization: Not on file    Attends meetings of clubs or organizations: Not on file  Relationship status: Not on file  . Intimate partner violence:    Fear of current or ex partner: Not on file    Emotionally abused: Not on file    Physically abused: Not on file    Forced sexual activity: Not on file  Other Topics Concern  . Not on file  Social History Narrative  . Not on file   Review of systems: Review of Systems  Constitutional: Negative for fever and chills.  HENT: Negative.   Eyes: Negative for blurred vision.  Respiratory: as per HPI  Cardiovascular: Negative for chest pain and palpitations.  Gastrointestinal: Negative for vomiting, diarrhea, blood per rectum. Genitourinary: Negative for dysuria, urgency, frequency and hematuria.  Musculoskeletal: Negative for myalgias, back pain and joint pain.  Skin: Negative for itching and rash.  Neurological: Negative for dizziness, tremors, focal weakness, seizures and loss of consciousness.  Endo/Heme/Allergies: Negative for environmental allergies.  Psychiatric/Behavioral: Negative for depression, suicidal ideas and hallucinations.  All other systems reviewed and are negative.  Physical Exam: Blood pressure (!) 142/60, pulse 65, height 6' (1.829 m), weight 192 lb 6.4 oz (87.3 kg), SpO2 99 %. Gen:      No acute distress HEENT:  EOMI, sclera anicteric Neck:     No masses; no thyromegaly Lungs:    Clear to auscultation bilaterally; normal respiratory effort CV:         Regular rate and rhythm; no murmurs Abd:      + bowel sounds; soft, non-tender; no palpable masses, no distension Ext:    No  edema; adequate peripheral perfusion Skin:      Warm and dry; no rash Neuro: alert and oriented x 3 Psych: normal mood and affect  Data Reviewed: CT scan 10/21/09- Moderate centrilobar emphysema. No pulmonary masses, opacities, asbestos disease or consolidation. Stable enlarged. Stable enlarged mediastinal and bilateral hilar lymph nodes unchanged for past 3.5 years compatible with benign or reactive process.  CT scan 06/19/16-emphysema, no consolidation or lung opacity. Mediastinal and bilateral hilar lymphadenopathy stable in size. Clustered left upper lobe nodules.  CT scan 07/01/17- stable subcentimeter pulmonary nodules, stable calcified mediastinal, hilar lymphadenopathy. I have reviewed all images personally.   PFTs 02/04/16 FVC 4.05 [9%), FEV1 2.40 (70%), F/F 59, TLC 101%, DLCO 35% Moderate obstructive defect with severe reduction in diffusion capacity.  CBC 05/21/17-WBC 7.2, eosinophils 3.7%, absolute eos count 266 Blood allergy profile 05/21/17-IgE 22, RAST panel is negative.     Assessment:  Gold stage B COPD. Back to baseline after recent exacerbation Continue on trelegy He was previously maintained on Daliresp.  If he continues to have frequent exacerbations then we may need to consider putting him back on it.  Minor hemoptysis, in early 2018 Subcm pulm nodules He has no recurrence of hemoptysis and no obvious source on CT scan. He has new tiny left upper lobe nodules and mediastinal LNs which has remained stable on follow up CT.  Health maintenance 02/25/2017-influenza 01/08/2014-Prevnar 13 06/09/2007-Pneumovax  Plan/Recommendations: -Continue trelegy inhaler  Follow up in 6 months  Marshell Garfinkel MD [facility] 11/02/2017, 9:03 AM  CC: [name], MD

## 2017-11-17 NOTE — Progress Notes (Signed)
[facility] Note  11/18/2017     CHIEF COMPLAINT Patient presents for Retina Follow Up   HISTORY OF PRESENT ILLNESS: Jerome Mays is a 77 y.o. male who presents to the clinic today for:   HPI    Retina Follow Up    Patient presents with  Retinal Break/Detachment.  In left eye.  Severity is moderate.  Duration of 4 weeks.  Since onset it is stable.  I, the attending physician,  performed the HPI with the patient and updated documentation appropriately.          Comments    Pt  Presents for RD F/U OS, pt states VA is the same as last time, he states everything is crooked and drops down, denies pain, floaters, and flashes, pt states compliance with gtts,        Last edited by [name], MD on 11/18/2017  8:19 AM. (History)      Referring physician: [name], MD [place], [place] [address]  HISTORICAL INFORMATION:   Selected notes from the [facility] Initial referral from Dr. [name] for RD OS s/p SBP (42 band) + 25g PPV/PFC/EL/FAX/SO OS 9.12.18 s/p PPV w/ membrane peel/relaxing retinectomy/PFC/SOX OS under GA, 10.15.18   CURRENT MEDICATIONS: Current Outpatient Medications (Ophthalmic Drugs)  Medication Sig  . Polyethyl Glycol-Propyl Glycol (SYSTANE OP) Place 1 drop into both eyes as needed (for dry eyes).   No current facility-administered medications for this visit.  (Ophthalmic Drugs)   Current Outpatient Medications (Other)  Medication Sig  . allopurinol (ZYLOPRIM) 300 MG tablet Take 300 mg by mouth daily.   [name] aspirin EC 81 MG tablet Take 81 mg by mouth daily.  . diphenhydramine-acetaminophen (TYLENOL PM) 25-500 MG TABS tablet Take 2 tablets by mouth at bedtime as needed (sleep).   . Fluticasone-Umeclidin-Vilant (TRELEGY ELLIPTA) 100-62.5-25 MCG/INH AEPB Inhale 1 puff into the lungs daily.  . Multiple Vitamin (ONE-A-DAY MENS PO) Take 1 tablet by mouth daily.   No current facility-administered  medications for this visit.  (Other)      REVIEW OF SYSTEMS: ROS    Positive for: Endocrine, Cardiovascular, Eyes   Negative for: Constitutional, Gastrointestinal, Neurological, Skin, Genitourinary, Musculoskeletal, HENT, Respiratory, Psychiatric, Allergic/Imm, Heme/Lymph   Last edited by Debbrah Alar, COT on 11/18/2017  8:10 AM. (History)       ALLERGIES Allergies  Allergen Reactions  . Lipitor [Atorvastatin] Rash and Other (See Comments)    Passed out    PAST MEDICAL HISTORY Past Medical History:  Diagnosis Date  . Anemia    low iron  . Arthritis   . Colonic polyp   . Constipation   . COPD (chronic obstructive pulmonary disease) ([place])   . GERD (gastroesophageal reflux disease)   . Gout   . History of kidney stones   . Malignant tumor of kidney ([place])   . Pain    LOWER BACK AND LEFT HIP - HX OF PREVIOUS LUMBAR AND CERVICAL SURGERY--PT STATES HE HAS HAD NUMBNESS IN BOTH FEET SINCE HIS BACK SURGERY  . Renal calculus   . Retinal detachment   . Shortness of breath    Past Surgical History:  Procedure Laterality Date  . BACK SURGERY     LUMBAR SURGERY  . CATARACT EXTRACTION Bilateral 2003   Surgeon unknown  . EYE SURGERY     BILATERAL CATARACT EXTRACTIONS  . GIVENS CAPSULE STUDY N/A 10/07/2015   Procedure: GIVENS CAPSULE STUDY;  Surgeon: Carol Ada,  MD;  Location: [facility];  Service: Endoscopy;  Laterality: N/A;  . HAND SURGERY     left  . KNEE SURGERY     right  . LAMINECTOMY     cervical  . LASER PHOTO ABLATION Left 08/19/2017   Procedure: LASER PHOTO ABLATION;  Surgeon: [name], MD;  Location: [facility];  Service: Ophthalmology;  Laterality: Left;  . MEMBRANE PEEL Left 03/22/2017   Procedure: POSSIBLE MEMBRANE PEEL WITH SILICONE OIL AND PERFLURON;  Surgeon: [name], MD;  Location: [facility];  Service: Ophthalmology;  Laterality: Left;  . NEPHRECTOMY     PT STATES RIGHT KIDNEY WAS REMOVED  . PARS PLANA VITRECTOMY Left 03/22/2017   Procedure: PARS  PLANA VITRECTOMY WITH 25 GAUGE;  Surgeon: [name], MD;  Location: [facility];  Service: Ophthalmology;  Laterality: Left;  . PARS PLANA VITRECTOMY W/ SCLERAL BUCKLE Left 08/19/2017   Procedure: 68 GAUGE PARS PLANA VITRECTOMY WITH SILCONE OIL REMOVAL;  Surgeon: [name], MD;  Location: [facility];  Service: Ophthalmology;  Laterality: Left;  . 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 TOTAL KNEE ARTHROPLASTY Left 10/19/2012   Procedure: LEFT TOTAL KNEE ARTHROPLASTY;  Surgeon: Magnus Sinning, MD;  Location: [facility];  Service: Orthopedics;  Laterality: Left;  [name] VITRECTOMY 25 GAUGE WITH SCLERAL BUCKLE Left 02/17/2017   Procedure: VITRECTOMY 25 GAUGE WITH SCLERAL BUCKLE membrane peel, injection of silicone oil, and endolaser photocoagulation.;  Surgeon: [name], MD;  Location: [facility];  Service: Ophthalmology;  Laterality: Left;    FAMILY HISTORY Family History  Problem Relation Age of Onset  . Diabetes Mother   . COPD Brother   . COPD Sister   . Amblyopia Neg Hx   . Blindness Neg Hx   . Cataracts Neg Hx   . Glaucoma Neg Hx   . Macular degeneration Neg Hx   . Retinal detachment Neg Hx   . Retinitis pigmentosa Neg Hx     SOCIAL HISTORY Social History   Tobacco Use  . Smoking status: Former Smoker    Packs/day: 2.00    Years: 40.00    Pack years: 80.00    Types: Cigarettes, Pipe    Last attempt to quit: 06/09/1995    Years since quitting: 22.4  . Smokeless tobacco: Never Used  Substance Use Topics  . Alcohol use: No  . Drug use: No         OPHTHALMIC EXAM:  Base Eye Exam    Visual Acuity (Snellen - Linear)      Right Left   Dist [place] 20/20 -2 20/150 +1   Dist [place] 20/20 -1 NI       Tonometry (Tonopen, 8:15 AM)      Right Left   Pressure 18 13       Pupils      Dark Light Shape React APD   Right 3 1.5 Round Slow None   Left 4 4 Round NR None       Visual Fields (Counting fingers)      Left Right    Full Full       Extraocular Movement      Right Left     Full, Ortho Full, Ortho       Neuro/Psych    Oriented x3:  Yes   Mood/Affect:  Normal       Dilation    Both eyes:  1.0% Mydriacyl, 2.5% Phenylephrine @ 8:15 AM        Slit Lamp and  Fundus Exam    External Exam      Right Left   External Normal Periorbital edema improving       Slit Lamp Exam      Right Left   Lids/Lashes Dermatochalasis - upper lid, Telangiectasia Dermatochalasis - upper lid, Meibomian gland dysfunction, Telangiectasia, Ecchymosis   Conjunctiva/Sclera White and quiet Sutures intact, no injection   Cornea Clear 1+ Edema, 1-2+ Descemet's folds   Anterior Chamber Deep and quiet Deep, no cell/pigment   Iris Round and dilated Round and dilated - dilation 14m   Lens Three piece Posterior chamber intraocular lens Three piece Posterior chamber intraocular lens in good position, micro condensations on posterior lens surface   Vitreous Vitreous syneresis Post vitrectomy       Fundus Exam      Right Left   Disc Temporal and Nasal Peripapillary pigment  Normal   C/D Ratio 0.45 0.45   Macula Flat; , Retinal pigment epithelial mottling - mild, No heme or edema slightly hazy view; flat; attached, Blunted foveal reflex   Vessels Copper wiring Vascular attenuation - mild   Periphery Attached, Flat; Scattered Reticular degeneration; no RT or RD moderate buckle height; temporal/inf retinectomy from 2-630 with peripheral edge attached with good laser at the edge; trace areas of fibrosis inferiorly and inf temporal -- surrounded by good laser          IMAGING AND PROCEDURES  Imaging and Procedures for 09/19/17  OCT, Retina - OU - Both Eyes       Right Eye Quality was good. Central Foveal Thickness: 251. Progression has been stable. Findings include normal foveal contour, no IRF, no SRF.   Left Eye Quality was good. Central Foveal Thickness: 274. Progression has been stable. Findings include no SRF, normal foveal contour, epiretinal membrane, intraretinal fluid  (Patchy ORA, cystic changes temporally ).   Notes Images taken, stored on drive  Diagnosis / Impression:  OD: NFP, No IRF, No SRF OS: retina attached with oil out - interval development of IRF temporally   Clinical management:  See below  Abbreviations: NFP - Normal foveal profile. CME - cystoid macular edema. PED - pigment epithelial detachment. IRF - intraretinal fluid. SRF - subretinal fluid. EZ - ellipsoid zone. ERM - epiretinal membrane. ORA - outer retinal atrophy. ORT - outer retinal tubulation. SRHM - subretinal hyper-reflective material                  ASSESSMENT/PLAN:    ICD-10-CM   1. 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 Retinal edema H35.81 OCT, Retina - OU - Both Eyes  4. Pseudophakia of left eye Z96.1   5. Corneal edema of left eye H18.20     1-3. 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 - inferior detachment from 2-10 oclock with HST at ~530 - fovea off, but sup nasal macula attached - by history RD likely started 4-5 wks prior to initial presentation, but fovea became involved ~Saturday, 02/13/17 - s/p SBP (42 band) + 25g PPV/PFC/EL/FAX/SO OS 9.12.18 - extensive PVR along inf temp arcades with tractional redetachment inf temp noted POM1 - s/p PPV w/ membrane peel/relaxing retinectomy/PFC/SOX OS under GA, 10.15.18 - now POM3 s/p PPV/SOR/EL OS, 3.14.19  - hazy view due to corneal edema -- persistent             - retina attached and in good position -- interval development of temporal cystic changes             -  IOP 13 today -- good off Alphagan P             - inc PF to BID OS             - cont PSO ung PRN OS             - f/u 4-6 weeks  4. Pseudophakia OU - IOLs in good position OU - monitor  5. Corneal edema OS - persistent post op corneal edema OS as above - will refer to cornea specialist for evaluation and management if continues to persist   Ophthalmic Meds Ordered this visit:   No orders of the defined types were placed in this encounter.      Return in about 6 weeks (around 12/30/2017) for F/U RD w/ PVR OS, Dilated Exam, OCT.  There are no Patient Instructions on file for this visit.   Explained the diagnoses, plan, and follow up with the patient and they expressed understanding.  Patient expressed understanding of the importance of proper follow up care.  This document serves as a record of services personally performed by Gardiner Sleeper, MD, PhD. It was created on their behalf by Ernest Mallick, OA, an ophthalmic assistant. The creation of this record is the provider's dictation and/or activities during the visit.    Electronically signed by: Ernest Mallick, OA  06.12.2019 12:08 AM   This document serves as a record of services personally performed by Gardiner Sleeper, MD, PhD. It was created on their behalf by Catha Brow, [place], a certified ophthalmic assistant. The creation of this record is the provider's dictation and/or activities during the visit.  Electronically signed by: Catha Brow, [place]  06.13.19 12:08 AM   Gardiner Sleeper, M.D., Ph.D. Diseases & Surgery of the Retina and Vitreous [facility]   I have reviewed the above documentation for accuracy and completeness, and I agree with the above. Gardiner Sleeper, M.D., Ph.D. 11/22/17 12:12 AM    Abbreviations: M myopia (nearsighted); A astigmatism; H hyperopia (farsighted); P presbyopia; Mrx spectacle prescription;  CTL contact lenses; OD right eye; OS left eye; OU both eyes  XT exotropia; ET esotropia; PEK punctate epithelial keratitis; PEE punctate epithelial erosions; DES dry eye syndrome; MGD meibomian gland dysfunction; ATs artificial tears; PFAT's preservative free artificial tears; Jal nuclear sclerotic cataract; PSC posterior subcapsular cataract; ERM epi-retinal membrane; PVD posterior vitreous detachment; RD retinal detachment; DM diabetes mellitus; DR diabetic  retinopathy; NPDR non-proliferative diabetic retinopathy; PDR proliferative diabetic retinopathy; CSME clinically significant macular edema; DME diabetic macular edema; dbh dot blot hemorrhages; CWS cotton wool spot; POAG primary open angle glaucoma; C/D cup-to-disc ratio; HVF humphrey visual field; GVF goldmann visual field; OCT optical coherence tomography; IOP intraocular pressure; BRVO Branch retinal vein occlusion; CRVO central retinal vein occlusion; CRAO central retinal artery occlusion; BRAO branch retinal artery occlusion; RT retinal tear; SB scleral buckle; PPV pars plana vitrectomy; VH Vitreous hemorrhage; PRP panretinal laser photocoagulation; IVK intravitreal kenalog; VMT vitreomacular traction; MH Macular hole;  NVD neovascularization of the disc; NVE neovascularization elsewhere; AREDS age related eye disease study; ARMD age related macular degeneration; POAG primary open angle glaucoma; EBMD epithelial/anterior basement membrane dystrophy; ACIOL anterior chamber intraocular lens; IOL intraocular lens; PCIOL posterior chamber intraocular lens; Phaco/IOL phacoemulsification with intraocular lens placement; [place] photorefractive keratectomy; LASIK laser assisted in situ keratomileusis; HTN hypertension; DM diabetes mellitus; COPD chronic obstructive pulmonary disease

## 2017-11-18 ENCOUNTER — Encounter (INDEPENDENT_AMBULATORY_CARE_PROVIDER_SITE_OTHER): Payer: Self-pay | Admitting: Ophthalmology

## 2017-11-18 ENCOUNTER — Ambulatory Visit (INDEPENDENT_AMBULATORY_CARE_PROVIDER_SITE_OTHER): Payer: Medicare HMO | Admitting: Ophthalmology

## 2017-11-18 DIAGNOSIS — H3322 Serous retinal detachment, left eye: Secondary | ICD-10-CM

## 2017-11-18 DIAGNOSIS — Z961 Presence of intraocular lens: Secondary | ICD-10-CM

## 2017-11-18 DIAGNOSIS — H3581 Retinal edema: Secondary | ICD-10-CM | POA: Diagnosis not present

## 2017-11-18 DIAGNOSIS — H1820 Unspecified corneal edema: Secondary | ICD-10-CM

## 2017-11-18 DIAGNOSIS — H3522 Other non-diabetic proliferative retinopathy, left eye: Secondary | ICD-10-CM

## 2017-11-22 ENCOUNTER — Encounter (INDEPENDENT_AMBULATORY_CARE_PROVIDER_SITE_OTHER): Payer: Self-pay | Admitting: Ophthalmology

## 2017-11-24 ENCOUNTER — Telehealth (INDEPENDENT_AMBULATORY_CARE_PROVIDER_SITE_OTHER): Payer: Self-pay

## 2017-11-24 MED ORDER — PREDNISOLONE ACETATE 1 % OP SUSP: 1.0000 [drp] | Freq: Two times a day (BID) | OPHTHALMIC | 2 refills | Status: DC

## 2017-11-24 MED ORDER — LOTEPREDNOL ETABONATE 1 % OP SUSP: 1.0000 [drp] | Freq: Two times a day (BID) | OPHTHALMIC | 2 refills | Status: DC

## 2017-11-24 NOTE — Telephone Encounter (Signed)
Pt wife called needing refill of PF; Will send refill to [facility] in [place] for PF to be used OS BID;   Catha Brow, COA

## 2017-11-24 NOTE — Telephone Encounter (Signed)
Pt wife called stating she mis-spoke, reports Jerome Mays needs refill of Inveltys; Will send RX to [facility] in [place] to be used OS BID until follow up; Of note - pt was given sample and coupon of Inveltys at last office visit;   Catha Brow, [place]

## 2017-12-29 NOTE — Progress Notes (Signed)
[facility] Note  12/30/2017     CHIEF COMPLAINT Patient presents for Retina Follow Up   HISTORY OF PRESENT ILLNESS: Jerome Mays is a 77 y.o. male who presents to the clinic today for:   HPI    Retina Follow Up    Patient presents with  Retinal Break/Detachment.  In left eye.  This started 4 months ago.  Severity is mild.  Duration of 6 weeks.  Since onset it is stable.  I, the attending physician,  performed the HPI with the patient and updated documentation appropriately.          Comments    F/U RD repair OS; Pt states OS VA is stable; Pt states he noticed he is beginning to have diplopia, states he noticed diplopia mainly at night; Pt states he feels that he is able to see better in "different rooms according to the light"; Pt states OU are comfortable; Pt reports using PF OS BID;        Last edited by [name], MD on 12/30/2017  8:46 AM. (History)      Referring physician: [name], MD [place], [place] [address]  HISTORICAL INFORMATION:   Selected notes from the [facility] Initial referral from Dr. [name] for RD OS s/p SBP (42 band) + 25g PPV/PFC/EL/FAX/SO OS 9.12.18 s/p PPV w/ membrane peel/relaxing retinectomy/PFC/SOX OS under GA, 10.15.18   CURRENT MEDICATIONS: Current Outpatient Medications (Ophthalmic Drugs)  Medication Sig  . Loteprednol Etabonate (INVELTYS) 1 % SUSP Apply 1 drop to eye 2 (two) times daily. Instill one drop into the left eye twice daily  . Polyethyl Glycol-Propyl Glycol (SYSTANE OP) Place 1 drop into both eyes as needed (for dry eyes).  . prednisoLONE acetate (PRED FORTE) 1 % ophthalmic suspension Place 1 drop into the left eye 2 (two) times daily.   No current facility-administered medications for this visit.  (Ophthalmic Drugs)   Current Outpatient Medications (Other)  Medication Sig  . allopurinol (ZYLOPRIM) 300 MG tablet Take 300 mg by mouth daily.   [name] aspirin EC 81 MG  tablet Take 81 mg by mouth daily.  . diphenhydramine-acetaminophen (TYLENOL PM) 25-500 MG TABS tablet Take 2 tablets by mouth at bedtime as needed (sleep).   . Fluticasone-Umeclidin-Vilant (TRELEGY ELLIPTA) 100-62.5-25 MCG/INH AEPB Inhale 1 puff into the lungs daily.  . Multiple Vitamin (ONE-A-DAY MENS PO) Take 1 tablet by mouth daily.   No current facility-administered medications for this visit.  (Other)      REVIEW OF SYSTEMS: ROS    Positive for: Musculoskeletal, Cardiovascular, Eyes   Negative for: Constitutional, Gastrointestinal, Neurological, Skin, Genitourinary, HENT, Endocrine, Respiratory, Psychiatric, Allergic/Imm, Heme/Lymph   Last edited by Cherrie Gauze, COA on 12/30/2017  7:56 AM. (History)       ALLERGIES Allergies  Allergen Reactions  . Lipitor [Atorvastatin] Rash and Other (See Comments)    Passed out    PAST MEDICAL HISTORY Past Medical History:  Diagnosis Date  . Anemia    low iron  . Arthritis   . Colonic polyp   . Constipation   . COPD (chronic obstructive pulmonary disease) ([place])   . GERD (gastroesophageal reflux disease)   . Gout   . History of kidney stones   . Malignant tumor of kidney ([place])   . Pain    LOWER BACK AND LEFT HIP - HX OF PREVIOUS LUMBAR AND CERVICAL SURGERY--PT STATES HE HAS HAD NUMBNESS IN BOTH FEET SINCE HIS  BACK SURGERY  . Renal calculus   . Retinal detachment   . Shortness of breath    Past Surgical History:  Procedure Laterality Date  . BACK SURGERY     LUMBAR SURGERY  . CATARACT EXTRACTION Bilateral 2003   Surgeon unknown  . EYE SURGERY     BILATERAL CATARACT EXTRACTIONS  . GIVENS CAPSULE STUDY N/A 10/07/2015   Procedure: GIVENS CAPSULE STUDY;  Surgeon: Carol Ada, MD;  Location: [facility];  Service: Endoscopy;  Laterality: N/A;  . HAND SURGERY     left  . KNEE SURGERY     right  . LAMINECTOMY     cervical  . LASER PHOTO ABLATION Left 08/19/2017   Procedure: LASER PHOTO ABLATION;  Surgeon: [name], MD;  Location: [facility];  Service: Ophthalmology;  Laterality: Left;  . MEMBRANE PEEL Left 03/22/2017   Procedure: POSSIBLE MEMBRANE PEEL WITH SILICONE OIL AND PERFLURON;  Surgeon: [name], MD;  Location: [facility];  Service: Ophthalmology;  Laterality: Left;  . NEPHRECTOMY     PT STATES RIGHT KIDNEY WAS REMOVED  . PARS PLANA VITRECTOMY Left 03/22/2017   Procedure: PARS PLANA VITRECTOMY WITH 25 GAUGE;  Surgeon: [name], MD;  Location: [facility];  Service: Ophthalmology;  Laterality: Left;  . PARS PLANA VITRECTOMY W/ SCLERAL BUCKLE Left 08/19/2017   Procedure: 75 GAUGE PARS PLANA VITRECTOMY WITH SILCONE OIL REMOVAL;  Surgeon: [name], MD;  Location: [facility];  Service: Ophthalmology;  Laterality: Left;  . 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 TOTAL KNEE ARTHROPLASTY Left 10/19/2012   Procedure: LEFT TOTAL KNEE ARTHROPLASTY;  Surgeon: Magnus Sinning, MD;  Location: [facility];  Service: Orthopedics;  Laterality: Left;  [name] VITRECTOMY 25 GAUGE WITH SCLERAL BUCKLE Left 02/17/2017   Procedure: VITRECTOMY 25 GAUGE WITH SCLERAL BUCKLE membrane peel, injection of silicone oil, and endolaser photocoagulation.;  Surgeon: [name], MD;  Location: [facility];  Service: Ophthalmology;  Laterality: Left;    FAMILY HISTORY Family History  Problem Relation Age of Onset  . Diabetes Mother   . COPD Brother   . COPD Sister   . Amblyopia Neg Hx   . Blindness Neg Hx   . Cataracts Neg Hx   . Glaucoma Neg Hx   . Macular degeneration Neg Hx   . Retinal detachment Neg Hx   . Retinitis pigmentosa Neg Hx     SOCIAL HISTORY Social History   Tobacco Use  . Smoking status: Former Smoker    Packs/day: 2.00    Years: 40.00    Pack years: 80.00    Types: Cigarettes, Pipe    Last attempt to quit: 06/09/1995    Years since quitting: 22.5  . Smokeless tobacco: Never Used  Substance Use Topics  . Alcohol use: No  . Drug use: No         OPHTHALMIC EXAM:  Base Eye Exam    Visual Acuity (Snellen -  Linear)      Right Left   Dist [place] 20/20 20/150 +2   Dist ph [place] NI NI       Tonometry (Tonopen, 8:09 AM)      Right Left   Pressure 11 12       Pupils      Dark Light Shape React APD   Right 2 1 Round Brisk None   Left 5 5 Round Minimal Trace       Visual Fields (Counting fingers)      Left Right  Full   Restrictions Partial outer superior temporal, inferior temporal deficiencies        Extraocular Movement      Right Left    Full, Ortho Full, Ortho       Neuro/Psych    Oriented x3:  Yes   Mood/Affect:  Normal       Dilation    Both eyes:  1.0% Mydriacyl, 2.5% Phenylephrine @ 8:09 AM        Slit Lamp and Fundus Exam    External Exam      Right Left   External Normal Periorbital edema improving       Slit Lamp Exam      Right Left   Lids/Lashes Dermatochalasis - upper lid, Telangiectasia Dermatochalasis - upper lid, Meibomian gland dysfunction, Telangiectasia, Ecchymosis   Conjunctiva/Sclera White and quiet Sutures intact, no injection   Cornea Clear 1+ Descemet's folds, trace edema, endo pigment   Anterior Chamber Deep and quiet Deep, 0.5+ cell / pigment   Iris Round and dilated Round and dilated - dilation 46m   Lens Three piece Posterior chamber intraocular lens Three piece Posterior chamber intraocular lens in good position, micro condensations on posterior lens surface   Vitreous Vitreous syneresis Post vitrectomy       Fundus Exam      Right Left   Disc Temporal and Nasal Peripapillary pigment  Sharp rim, trace pallor   C/D Ratio 0.45 0.45   Macula Flat; , Retinal pigment epithelial mottling - mild, No heme or edema slightly hazy view; flat; attached, Blunted foveal reflex   Vessels Copper wiring Vascular attenuation - mild   Periphery Attached, Flat; Scattered Reticular degeneration; no RT or RD moderate buckle height; temporal/inf retinectomy from 2-630 with peripheral edge attached with good laser at the edge; trace areas of fibrosis inferiorly  and inf temporal -- surrounded by good laser          IMAGING AND PROCEDURES  Imaging and Procedures for 09/19/17  OCT, Retina - OU - Both Eyes       Right Eye Quality was good. Central Foveal Thickness: 253. Progression has been stable. Findings include normal foveal contour, no IRF, no SRF.   Left Eye Quality was good. Central Foveal Thickness: 284. Progression has been stable. Findings include no SRF, normal foveal contour, epiretinal membrane, intraretinal fluid (Patchy ORA, cystic changes ).   Notes Images taken, stored on drive  Diagnosis / Impression:  OD: NFP, No IRF, No SRF OS: retina attached with oil out - stable IRF  Clinical management:  See below  Abbreviations: NFP - Normal foveal profile. CME - cystoid macular edema. PED - pigment epithelial detachment. IRF - intraretinal fluid. SRF - subretinal fluid. EZ - ellipsoid zone. ERM - epiretinal membrane. ORA - outer retinal atrophy. ORT - outer retinal tubulation. SRHM - subretinal hyper-reflective material                  ASSESSMENT/PLAN:    ICD-10-CM   1. 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 Retinal edema H35.81 OCT, Retina - OU - Both Eyes  4. 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 Corneal edema of left eye H18.20     1-3. 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 - inferior detachment from 2-10 oclock with HST at ~530 - fovea off, but sup nasal macula attached - by history RD likely started 4-5 wks prior  to initial presentation, but fovea became involved ~Saturday, 02/13/17 - s/p SBP (42 band) + 25g PPV/PFC/EL/FAX/SO OS 9.12.18 - extensive PVR along inf temp arcades with tractional redetachment inf temp noted POM1 - s/p PPV w/ membrane peel/relaxing retinectomy/PFC/SOX OS under GA, 10.15.18 - now POM4 s/p PPV/SOR/EL OS, 3.14.19 - mild persistent corneal edema - retina attached and in good position -- interval  development of temporal cystic changes - IOP 12 today -- good off [place]ph[place] P - mild interval worsening of cystic changes -- inc PF to QID OS - cont PSO ung PRN OS - f/u 4-6 weeks  4. Pseudophakia OU - IOLs in good position OU - monitor  5. Corneal edema OS - persistent post op corneal edema OS as above - will refer to cornea specialist for evaluation and management if continues to persist   Ophthalmic Meds Ordered this visit:  No orders of the defined types were placed in this encounter.      Return in about 5 weeks (around 02/03/2018) for F/U RD rep OS, DFE, OCT.  There are no Patient Instructions on file for this visit.   Explained the diagnoses, plan, and follow up with the patient and they expressed understanding.  Patient expressed understanding of the importance of proper follow up care.  This document serves as a record of services personally performed by Gardiner Sleeper, MD, PhD. It was created on their behalf by Catha Brow, [place], a certified ophthalmic assistant. The creation of this record is the provider's dictation and/or activities during the visit.  Electronically signed by: Catha Brow, [place]  07.24.19 11:27 PM    Gardiner Sleeper, M.D., Ph.D. Diseases & Surgery of the Retina and Vitreous [facility]   I have reviewed the above documentation for accuracy and completeness, and I agree with the above. Gardiner Sleeper, M.D., Ph.D. 12/30/17 11:43 PM   Abbreviations: M myopia (nearsighted); A astigmatism; H hyperopia (farsighted); P presbyopia; Mrx spectacle prescription;  CTL contact lenses; OD right eye; OS left eye; OU both eyes  XT exotropia; ET esotropia; PEK punctate epithelial keratitis; PEE punctate epithelial erosions; DES dry eye syndrome; MGD meibomian gland dysfunction; ATs artificial tears; PFAT's preservative free artificial tears; [place] nuclear sclerotic cataract; PSC posterior subcapsular cataract; ERM epi-retinal membrane;  PVD posterior vitreous detachment; RD retinal detachment; DM diabetes mellitus; DR diabetic retinopathy; NPDR non-proliferative diabetic retinopathy; PDR proliferative diabetic retinopathy; CSME clinically significant macular edema; DME diabetic macular edema; dbh dot blot hemorrhages; CWS cotton wool spot; POAG primary open angle glaucoma; C/D cup-to-disc ratio; HVF humphrey visual field; GVF goldmann visual field; OCT optical coherence tomography; IOP intraocular pressure; BRVO Branch retinal vein occlusion; CRVO central retinal vein occlusion; CRAO central retinal artery occlusion; BRAO branch retinal artery occlusion; RT retinal tear; SB scleral buckle; PPV pars plana vitrectomy; VH Vitreous hemorrhage; PRP panretinal laser photocoagulation; IVK intravitreal kenalog; VMT vitreomacular traction; MH Macular hole;  NVD neovascularization of the disc; NVE neovascularization elsewhere; AREDS age related eye disease study; ARMD age related macular degeneration; POAG primary open angle glaucoma; EBMD epithelial/anterior basement membrane dystrophy; ACIOL anterior chamber intraocular lens; IOL intraocular lens; PCIOL posterior chamber intraocular lens; Phaco/IOL phacoemulsification with intraocular lens placement; [place] photorefractive keratectomy; LASIK laser assisted in situ keratomileusis; HTN hypertension; DM diabetes mellitus; COPD chronic obstructive pulmonary disease

## 2017-12-30 ENCOUNTER — Ambulatory Visit (INDEPENDENT_AMBULATORY_CARE_PROVIDER_SITE_OTHER): Payer: Medicare HMO | Admitting: Ophthalmology

## 2017-12-30 ENCOUNTER — Encounter (INDEPENDENT_AMBULATORY_CARE_PROVIDER_SITE_OTHER): Payer: Self-pay | Admitting: Ophthalmology

## 2017-12-30 DIAGNOSIS — H3522 Other non-diabetic proliferative retinopathy, left eye: Secondary | ICD-10-CM

## 2017-12-30 DIAGNOSIS — H3581 Retinal edema: Secondary | ICD-10-CM | POA: Diagnosis not present

## 2017-12-30 DIAGNOSIS — H3322 Serous retinal detachment, left eye: Secondary | ICD-10-CM

## 2017-12-30 DIAGNOSIS — Z961 Presence of intraocular lens: Secondary | ICD-10-CM

## 2017-12-30 DIAGNOSIS — H1820 Unspecified corneal edema: Secondary | ICD-10-CM | POA: Diagnosis not present

## 2018-02-01 DIAGNOSIS — L723 Sebaceous cyst: Secondary | ICD-10-CM | POA: Diagnosis not present

## 2018-02-01 DIAGNOSIS — L570 Actinic keratosis: Secondary | ICD-10-CM | POA: Diagnosis not present

## 2018-02-01 DIAGNOSIS — L821 Other seborrheic keratosis: Secondary | ICD-10-CM | POA: Diagnosis not present

## 2018-02-02 NOTE — Progress Notes (Signed)
[facility] Note  02/03/2018     CHIEF COMPLAINT Patient presents for Retina Follow Up   HISTORY OF PRESENT ILLNESS: Jerome Mays is a 77 y.o. male who presents to the clinic today for:   HPI    Retina Follow Up    Patient presents with  Retinal Break/Detachment.  Severity is moderate.  Duration of 5 weeks.  Since onset it is stable.  I, the attending physician,  performed the HPI with the patient and updated documentation appropriately.          Comments    Pt presents for RD OS f/u, pt states his VA is about the same, pt denies flashes, floaters, pain and wavy vision, pt is using gtts as prescribed       Last edited by [name], MD on 02/03/2018  8:17 AM. (History)      Referring physician: [name], MD [place], [place] [address]  HISTORICAL INFORMATION:   Selected notes from the [facility] Initial referral from Dr. [name] for RD OS s/p SBP (42 band) + 25g PPV/PFC/EL/FAX/SO OS 9.12.18 s/p PPV w/ membrane peel/relaxing retinectomy/PFC/SOX OS under GA, 10.15.18   CURRENT MEDICATIONS: Current Outpatient Medications (Ophthalmic Drugs)  Medication Sig  . Loteprednol Etabonate (INVELTYS) 1 % SUSP Apply 1 drop to eye 2 (two) times daily. Instill one drop into the left eye twice daily  . Polyethyl Glycol-Propyl Glycol (SYSTANE OP) Place 1 drop into both eyes as needed (for dry eyes).  . prednisoLONE acetate (PRED FORTE) 1 % ophthalmic suspension Place 1 drop into the left eye 2 (two) times daily.   No current facility-administered medications for this visit.  (Ophthalmic Drugs)   Current Outpatient Medications (Other)  Medication Sig  . omeprazole (PRILOSEC) 20 MG capsule   . allopurinol (ZYLOPRIM) 300 MG tablet Take 300 mg by mouth daily.   [name] aspirin 81 MG tablet aspirin  . aspirin EC 81 MG tablet Take 81 mg by mouth daily.  . diphenhydramine-acetaminophen (TYLENOL PM) 25-500 MG TABS tablet Take 2  tablets by mouth at bedtime as needed (sleep).   . Fluticasone-Umeclidin-Vilant (TRELEGY ELLIPTA) 100-62.5-25 MCG/INH AEPB Inhale 1 puff into the lungs daily.  . Multiple Vitamin (ONE-A-DAY MENS PO) Take 1 tablet by mouth daily.  . Multiple Vitamins-Minerals (DAILY MULTI VITAMIN/MINERALS PO) Daily Multi-Vitamin   No current facility-administered medications for this visit.  (Other)      REVIEW OF SYSTEMS: ROS    Positive for: Cardiovascular, Eyes   Negative for: Constitutional, Gastrointestinal, Neurological, Skin, Genitourinary, Musculoskeletal, HENT, Endocrine, Respiratory, Psychiatric, Allergic/Imm, Heme/Lymph   Last edited by Debbrah Alar, COT on 02/03/2018  8:01 AM. (History)       ALLERGIES Allergies  Allergen Reactions  . Lipitor [Atorvastatin] Rash and Other (See Comments)    Passed out    PAST MEDICAL HISTORY Past Medical History:  Diagnosis Date  . Anemia    low iron  . Arthritis   . Colonic polyp   . Constipation   . COPD (chronic obstructive pulmonary disease) ([place])   . GERD (gastroesophageal reflux disease)   . Gout   . History of kidney stones   . Malignant tumor of kidney ([place])   . Pain    LOWER BACK AND LEFT HIP - HX OF PREVIOUS LUMBAR AND CERVICAL SURGERY--PT STATES HE HAS HAD NUMBNESS IN BOTH FEET SINCE HIS BACK SURGERY  . Renal calculus   . Retinal detachment   .  Shortness of breath    Past Surgical History:  Procedure Laterality Date  . BACK SURGERY     LUMBAR SURGERY  . CATARACT EXTRACTION Bilateral 2003   Surgeon unknown  . EYE SURGERY     BILATERAL CATARACT EXTRACTIONS  . GIVENS CAPSULE STUDY N/A 10/07/2015   Procedure: GIVENS CAPSULE STUDY;  Surgeon: Carol Ada, MD;  Location: [facility];  Service: Endoscopy;  Laterality: N/A;  . HAND SURGERY     left  . KNEE SURGERY     right  . LAMINECTOMY     cervical  . LASER PHOTO ABLATION Left 08/19/2017   Procedure: LASER PHOTO ABLATION;  Surgeon: [name], MD;  Location: [facility];   Service: Ophthalmology;  Laterality: Left;  . MEMBRANE PEEL Left 03/22/2017   Procedure: POSSIBLE MEMBRANE PEEL WITH SILICONE OIL AND PERFLURON;  Surgeon: [name], MD;  Location: [facility];  Service: Ophthalmology;  Laterality: Left;  . NEPHRECTOMY     PT STATES RIGHT KIDNEY WAS REMOVED  . PARS PLANA VITRECTOMY Left 03/22/2017   Procedure: PARS PLANA VITRECTOMY WITH 25 GAUGE;  Surgeon: [name], MD;  Location: [facility];  Service: Ophthalmology;  Laterality: Left;  . PARS PLANA VITRECTOMY W/ SCLERAL BUCKLE Left 08/19/2017   Procedure: 29 GAUGE PARS PLANA VITRECTOMY WITH SILCONE OIL REMOVAL;  Surgeon: [name], MD;  Location: [facility];  Service: Ophthalmology;  Laterality: Left;  . 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 TOTAL KNEE ARTHROPLASTY Left 10/19/2012   Procedure: LEFT TOTAL KNEE ARTHROPLASTY;  Surgeon: Magnus Sinning, MD;  Location: [facility];  Service: Orthopedics;  Laterality: Left;  [name] VITRECTOMY 25 GAUGE WITH SCLERAL BUCKLE Left 02/17/2017   Procedure: VITRECTOMY 25 GAUGE WITH SCLERAL BUCKLE membrane peel, injection of silicone oil, and endolaser photocoagulation.;  Surgeon: [name], MD;  Location: [facility];  Service: Ophthalmology;  Laterality: Left;    FAMILY HISTORY Family History  Problem Relation Age of Onset  . Diabetes Mother   . COPD Brother   . COPD Sister   . Amblyopia Neg Hx   . Blindness Neg Hx   . Cataracts Neg Hx   . Glaucoma Neg Hx   . Macular degeneration Neg Hx   . Retinal detachment Neg Hx   . Retinitis pigmentosa Neg Hx     SOCIAL HISTORY Social History   Tobacco Use  . Smoking status: Former Smoker    Packs/day: 2.00    Years: 40.00    Pack years: 80.00    Types: Cigarettes, Pipe    Last attempt to quit: 06/09/1995    Years since quitting: 22.6  . Smokeless tobacco: Never Used  Substance Use Topics  . Alcohol use: No  . Drug use: No         OPHTHALMIC EXAM:  Base Eye Exam    Visual Acuity (Snellen - Linear)      Right Left   Dist  [place] 20/25 -1 20/150 +1   Dist ph [place] NI 20/100 +2       Tonometry (Tonopen, 8:07 AM)      Right Left   Pressure 18 17       Pupils      Dark Light Shape React APD   Right 2 1 Round Sluggish None   Left 5 5 Round Minimal Trace       Visual Fields (Counting fingers)      Left Right    Full Full       Extraocular Movement  Right Left    Full, Ortho Full, Ortho       Neuro/Psych    Oriented x3:  Yes   Mood/Affect:  Normal       Dilation    Both eyes:  1.0% Mydriacyl, 2.5% Phenylephrine @ 8:07 AM        Slit Lamp and Fundus Exam    External Exam      Right Left   External Normal Periorbital edema improving       Slit Lamp Exam      Right Left   Lids/Lashes Dermatochalasis - upper lid, Telangiectasia Dermatochalasis - upper lid, Meibomian gland dysfunction, Telangiectasia, Ecchymosis   Conjunctiva/Sclera White and quiet Sutures intact, no injection   Cornea Clear 1+ Descemet's folds, trace edema, endo pigment   Anterior Chamber Deep and quiet Deep, 0.5+ cell / pigment   Iris Round and dilated Round and dilated - dilation 57m   Lens Three piece Posterior chamber intraocular lens Three piece Posterior chamber intraocular lens in good position, micro condensations on posterior lens surface   Vitreous Vitreous syneresis Post vitrectomy       Fundus Exam      Right Left   Disc Temporal and Nasal Peripapillary pigment  Sharp rim, trace pallor   C/D Ratio 0.45 0.45   Macula Flat; , Retinal pigment epithelial mottling - mild, No heme or edema slightly hazy view; flat; attached, Blunted foveal reflex   Vessels Copper wiring Vascular attenuation - mild   Periphery Attached, Flat; Scattered Reticular degeneration; no RT or RD moderate buckle height; temporal/inf retinectomy from 2-630 with peripheral edge attached with good laser at the edge; trace areas of fibrosis inferiorly and inf temporal -- surrounded by good laser          IMAGING AND PROCEDURES  Imaging and  Procedures for 09/19/17  OCT, Retina - OU - Both Eyes       Right Eye Quality was good. Central Foveal Thickness: 250. Progression has been stable. Findings include normal foveal contour, no IRF, no SRF.   Left Eye Quality was good. Central Foveal Thickness: 280. Progression has been stable. Findings include no SRF, normal foveal contour, epiretinal membrane, intraretinal fluid (Patchy ORA, cystic changes ).   Notes Images taken, stored on drive  Diagnosis / Impression:  OD: NFP, No IRF, No SRF OS: retina attached with oil out - stable cystic changes/IRF  Clinical management:  See below  Abbreviations: NFP - Normal foveal profile. CME - cystoid macular edema. PED - pigment epithelial detachment. IRF - intraretinal fluid. SRF - subretinal fluid. EZ - ellipsoid zone. ERM - epiretinal membrane. ORA - outer retinal atrophy. ORT - outer retinal tubulation. SRHM - subretinal hyper-reflective material                  ASSESSMENT/PLAN:    ICD-10-CM   1. 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 Retinal edema H35.81 OCT, Retina - OU - Both Eyes  4. 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 Corneal edema of left eye H18.20   6. Pseudophakia of left eye Z96.1     1-3. 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 - inferior detachment from 2-10 oclock with HST at ~530 - fovea off, but sup nasal macula attached - by history RD likely started 4-5 wks prior to initial presentation, but fovea became involved ~Saturday, 02/13/17 - s/p SBP (42 band) + 25g  PPV/PFC/EL/FAX/SO OS 9.12.18 - extensive PVR along inf temp arcades with tractional redetachment inf temp noted POM1 - s/p PPV w/ membrane peel/relaxing retinectomy/PFC/SOX OS under GA, 10.15.18 - now POM4 s/p PPV/SOR/EL OS, 3.14.19 - mild persistent corneal edema - retina attached and in good position -- persistent cystic changes / IRF -- stable -- continue  PF QID OS - IOP 12 today -- good off Alphagan P - cont PSO ung PRN OS - f/u 6-8 weeks  4. Pseudophakia OU - IOLs in good position OU - monitor  5. Corneal edema OS - persistent post op corneal edema OS as above - will refer to cornea specialist for evaluation and management if continues to persist   Ophthalmic Meds Ordered this visit:  No orders of the defined types were placed in this encounter.      Return in about 6 weeks (around 03/17/2018).  There are no Patient Instructions on file for this visit.   Explained the diagnoses, plan, and follow up with the patient and they expressed understanding.  Patient expressed understanding of the importance of proper follow up care.  This document serves as a record of services personally performed by Gardiner Sleeper, MD, PhD. It was created on their behalf by Ernest Mallick, OA, an ophthalmic assistant. The creation of this record is the provider's dictation and/or activities during the visit.    Electronically signed by: Ernest Mallick, OA  08.28.2019 8:21 AM     Gardiner Sleeper, M.D., Ph.D. Diseases & Surgery of the Retina and Vitreous [facility]   I have reviewed the above documentation for accuracy and completeness, and I agree with the above. Gardiner Sleeper, M.D., Ph.D. 02/03/18 8:21 AM    Abbreviations: M myopia (nearsighted); A astigmatism; H hyperopia (farsighted); P presbyopia; Mrx spectacle prescription;  CTL contact lenses; OD right eye; OS left eye; OU both eyes  XT exotropia; ET esotropia; PEK punctate epithelial keratitis; PEE punctate epithelial erosions; DES dry eye syndrome; MGD meibomian gland dysfunction; ATs artificial tears; PFAT's preservative free artificial tears; [place] nuclear sclerotic cataract; PSC posterior subcapsular cataract; ERM epi-retinal membrane; PVD posterior vitreous detachment; RD retinal detachment; DM diabetes mellitus; DR diabetic retinopathy; NPDR non-proliferative  diabetic retinopathy; PDR proliferative diabetic retinopathy; CSME clinically significant macular edema; DME diabetic macular edema; dbh dot blot hemorrhages; CWS cotton wool spot; POAG primary open angle glaucoma; C/D cup-to-disc ratio; HVF humphrey visual field; GVF goldmann visual field; OCT optical coherence tomography; IOP intraocular pressure; BRVO Branch retinal vein occlusion; CRVO central retinal vein occlusion; CRAO central retinal artery occlusion; BRAO branch retinal artery occlusion; RT retinal tear; SB scleral buckle; PPV pars plana vitrectomy; VH Vitreous hemorrhage; PRP panretinal laser photocoagulation; IVK intravitreal kenalog; VMT vitreomacular traction; MH Macular hole;  NVD neovascularization of the disc; NVE neovascularization elsewhere; AREDS age related eye disease study; ARMD age related macular degeneration; POAG primary open angle glaucoma; EBMD epithelial/anterior basement membrane dystrophy; ACIOL anterior chamber intraocular lens; IOL intraocular lens; PCIOL posterior chamber intraocular lens; Phaco/IOL phacoemulsification with intraocular lens placement; [place] photorefractive keratectomy; LASIK laser assisted in situ keratomileusis; HTN hypertension; DM diabetes mellitus; COPD chronic obstructive pulmonary disease

## 2018-02-03 ENCOUNTER — Other Ambulatory Visit: Payer: Self-pay | Admitting: Pulmonary Disease

## 2018-02-03 ENCOUNTER — Encounter (INDEPENDENT_AMBULATORY_CARE_PROVIDER_SITE_OTHER): Payer: Self-pay | Admitting: Ophthalmology

## 2018-02-03 ENCOUNTER — Ambulatory Visit (INDEPENDENT_AMBULATORY_CARE_PROVIDER_SITE_OTHER): Payer: Medicare HMO | Admitting: Ophthalmology

## 2018-02-03 DIAGNOSIS — H1820 Unspecified corneal edema: Secondary | ICD-10-CM

## 2018-02-03 DIAGNOSIS — Z961 Presence of intraocular lens: Secondary | ICD-10-CM | POA: Diagnosis not present

## 2018-02-03 DIAGNOSIS — H3322 Serous retinal detachment, left eye: Secondary | ICD-10-CM | POA: Diagnosis not present

## 2018-02-03 DIAGNOSIS — H3581 Retinal edema: Secondary | ICD-10-CM | POA: Diagnosis not present

## 2018-02-03 DIAGNOSIS — H3522 Other non-diabetic proliferative retinopathy, left eye: Secondary | ICD-10-CM | POA: Diagnosis not present

## 2018-02-03 MED ORDER — PREDNISOLONE ACETATE 1 % OP SUSP: 1.0000 [drp] | Freq: Four times a day (QID) | OPHTHALMIC | 2 refills | Status: DC

## 2018-02-14 DIAGNOSIS — J449 Chronic obstructive pulmonary disease, unspecified: Secondary | ICD-10-CM | POA: Diagnosis not present

## 2018-02-14 DIAGNOSIS — M25562 Pain in left knee: Secondary | ICD-10-CM | POA: Diagnosis not present

## 2018-02-14 DIAGNOSIS — R7302 Impaired glucose tolerance (oral): Secondary | ICD-10-CM | POA: Diagnosis not present

## 2018-02-14 DIAGNOSIS — H33309 Unspecified retinal break, unspecified eye: Secondary | ICD-10-CM | POA: Diagnosis not present

## 2018-02-14 DIAGNOSIS — C649 Malignant neoplasm of unspecified kidney, except renal pelvis: Secondary | ICD-10-CM | POA: Diagnosis not present

## 2018-02-14 DIAGNOSIS — M109 Gout, unspecified: Secondary | ICD-10-CM | POA: Diagnosis not present

## 2018-02-14 DIAGNOSIS — N183 Chronic kidney disease, stage 3 (moderate): Secondary | ICD-10-CM | POA: Diagnosis not present

## 2018-02-14 DIAGNOSIS — K115 Sialolithiasis: Secondary | ICD-10-CM | POA: Diagnosis not present

## 2018-02-14 DIAGNOSIS — M1990 Unspecified osteoarthritis, unspecified site: Secondary | ICD-10-CM | POA: Diagnosis not present

## 2018-02-14 DIAGNOSIS — M5416 Radiculopathy, lumbar region: Secondary | ICD-10-CM | POA: Diagnosis not present

## 2018-02-17 ENCOUNTER — Ambulatory Visit (INDEPENDENT_AMBULATORY_CARE_PROVIDER_SITE_OTHER): Payer: Medicare HMO | Admitting: Otolaryngology

## 2018-02-17 DIAGNOSIS — D37030 Neoplasm of uncertain behavior of the parotid salivary glands: Secondary | ICD-10-CM | POA: Diagnosis not present

## 2018-02-25 ENCOUNTER — Other Ambulatory Visit (INDEPENDENT_AMBULATORY_CARE_PROVIDER_SITE_OTHER): Payer: Self-pay | Admitting: Otolaryngology

## 2018-02-25 DIAGNOSIS — K118 Other diseases of salivary glands: Secondary | ICD-10-CM

## 2018-03-17 ENCOUNTER — Ambulatory Visit (HOSPITAL_COMMUNITY)
Admission: RE | Admit: 2018-03-17 | Discharge: 2018-03-17 | Disposition: A | Discharge status: Home / Self Care | Payer: Medicare HMO | Source: Ambulatory Visit | Attending: Otolaryngology | Admitting: Otolaryngology

## 2018-03-17 DIAGNOSIS — D110 Benign neoplasm of parotid gland: Secondary | ICD-10-CM | POA: Diagnosis not present

## 2018-03-17 DIAGNOSIS — K118 Other diseases of salivary glands: Secondary | ICD-10-CM

## 2018-03-17 DIAGNOSIS — J439 Emphysema, unspecified: Secondary | ICD-10-CM | POA: Diagnosis not present

## 2018-03-17 LAB — POCT I-STAT CREATININE: Creatinine, Ser: 1 mg/dL (ref 0.61–1.24)

## 2018-03-17 MED ORDER — IOHEXOL 300 MG/ML  SOLN
75.0000 mL | Freq: Once | INTRAMUSCULAR | Status: AC | PRN
  Administered 2018-03-17: 75 mL via INTRAVENOUS

## 2018-03-26 DIAGNOSIS — N200 Calculus of kidney: Secondary | ICD-10-CM | POA: Insufficient documentation

## 2018-03-28 DIAGNOSIS — N138 Other obstructive and reflux uropathy: Secondary | ICD-10-CM | POA: Diagnosis not present

## 2018-03-28 DIAGNOSIS — N281 Cyst of kidney, acquired: Secondary | ICD-10-CM | POA: Diagnosis not present

## 2018-03-28 DIAGNOSIS — N200 Calculus of kidney: Secondary | ICD-10-CM | POA: Diagnosis not present

## 2018-03-28 DIAGNOSIS — Z85528 Personal history of other malignant neoplasm of kidney: Secondary | ICD-10-CM | POA: Diagnosis not present

## 2018-03-28 DIAGNOSIS — Z905 Acquired absence of kidney: Secondary | ICD-10-CM | POA: Diagnosis not present

## 2018-03-28 DIAGNOSIS — N401 Enlarged prostate with lower urinary tract symptoms: Secondary | ICD-10-CM | POA: Diagnosis not present

## 2018-03-28 DIAGNOSIS — N528 Other male erectile dysfunction: Secondary | ICD-10-CM | POA: Diagnosis not present

## 2018-03-28 DIAGNOSIS — C641 Malignant neoplasm of right kidney, except renal pelvis: Secondary | ICD-10-CM | POA: Diagnosis not present

## 2018-03-30 NOTE — Progress Notes (Signed)
[facility] Note  03/31/2018     CHIEF COMPLAINT Patient presents for Retina Follow Up   HISTORY OF PRESENT ILLNESS: Jerome Mays is a 77 y.o. male who presents to the clinic today for:   HPI    Retina Follow Up    Patient presents with  Retinal Break/Detachment.  In left eye.  Severity is moderate.  Duration of 8 weeks.  Since onset it is stable.  I, the attending physician,  performed the HPI with the patient and updated documentation appropriately.          Comments    8 week f/u s/p PPV/SDR 08/19/2017- Patient states vision the same OU. No new floaters or flashes.        Last edited by [name], MD on 03/31/2018  9:03 AM. (History)    pt states there is no change in his vision, he is not having any problems with the gtts that he is on, pt denies eye pain, discomfort or irritation  Referring physician: [name], MD [place], [place] [address]  HISTORICAL INFORMATION:   Selected notes from the [facility] Initial referral from Dr. [name] for RD OS s/p SBP (42 band) + 25g PPV/PFC/EL/FAX/SO OS 9.12.18 s/p PPV w/ membrane peel/relaxing retinectomy/PFC/SOX OS under GA, 10.15.18   CURRENT MEDICATIONS: Current Outpatient Medications (Ophthalmic Drugs)  Medication Sig  . Loteprednol Etabonate (INVELTYS) 1 % SUSP Apply 1 drop to eye 2 (two) times daily. Instill one drop into the left eye twice daily  . Polyethyl Glycol-Propyl Glycol (SYSTANE OP) Place 1 drop into both eyes as needed (for dry eyes).  . prednisoLONE acetate (PRED FORTE) 1 % ophthalmic suspension Place 1 drop into the left eye 4 (four) times daily.  [name] ketorolac (ACULAR) 0.5 % ophthalmic solution Place 1 drop into the left eye 4 (four) times daily.   No current facility-administered medications for this visit.  (Ophthalmic Drugs)   Current Outpatient Medications (Other)  Medication Sig  . allopurinol (ZYLOPRIM) 300 MG tablet Take 300 mg by mouth  daily.   [name] aspirin 81 MG tablet aspirin  . aspirin EC 81 MG tablet Take 81 mg by mouth daily.  . diphenhydramine-acetaminophen (TYLENOL PM) 25-500 MG TABS tablet Take 2 tablets by mouth at bedtime as needed (sleep).   . Multiple Vitamin (ONE-A-DAY MENS PO) Take 1 tablet by mouth daily.  . Multiple Vitamins-Minerals (DAILY MULTI VITAMIN/MINERALS PO) Daily Multi-Vitamin  . omeprazole (PRILOSEC) 20 MG capsule   . TRELEGY ELLIPTA 100-62.5-25 MCG/INH AEPB INHALE 1 PUFF ONCE DAILY   No current facility-administered medications for this visit.  (Other)      REVIEW OF SYSTEMS: ROS    Positive for: Cardiovascular, Eyes   Negative for: Constitutional, Gastrointestinal, Neurological, Skin, Genitourinary, Musculoskeletal, HENT, Endocrine, Respiratory, Psychiatric, Allergic/Imm, Heme/Lymph   Last edited by Roselee Nova D on 03/31/2018  8:01 AM. (History)       ALLERGIES Allergies  Allergen Reactions  . Lipitor [Atorvastatin] Rash and Other (See Comments)    Passed out    PAST MEDICAL HISTORY Past Medical History:  Diagnosis Date  . Anemia    low iron  . Arthritis   . Colonic polyp   . Constipation   . COPD (chronic obstructive pulmonary disease) ([place])   . GERD (gastroesophageal reflux disease)   . Gout   . History of kidney stones   . Malignant tumor of kidney ([place])   . Pain  LOWER BACK AND LEFT HIP - HX OF PREVIOUS LUMBAR AND CERVICAL SURGERY--PT STATES HE HAS HAD NUMBNESS IN BOTH FEET SINCE HIS BACK SURGERY  . Renal calculus   . Retinal detachment   . Shortness of breath    Past Surgical History:  Procedure Laterality Date  . BACK SURGERY     LUMBAR SURGERY  . CATARACT EXTRACTION Bilateral 2003   Surgeon unknown  . EYE SURGERY     BILATERAL CATARACT EXTRACTIONS  . GIVENS CAPSULE STUDY N/A 10/07/2015   Procedure: GIVENS CAPSULE STUDY;  Surgeon: Carol Ada, MD;  Location: [facility];  Service: Endoscopy;  Laterality: N/A;  . HAND SURGERY     left  . KNEE SURGERY      right  . LAMINECTOMY     cervical  . LASER PHOTO ABLATION Left 08/19/2017   Procedure: LASER PHOTO ABLATION;  Surgeon: [name], MD;  Location: [facility];  Service: Ophthalmology;  Laterality: Left;  . MEMBRANE PEEL Left 03/22/2017   Procedure: POSSIBLE MEMBRANE PEEL WITH SILICONE OIL AND PERFLURON;  Surgeon: [name], MD;  Location: [facility];  Service: Ophthalmology;  Laterality: Left;  . NEPHRECTOMY     PT STATES RIGHT KIDNEY WAS REMOVED  . PARS PLANA VITRECTOMY Left 03/22/2017   Procedure: PARS PLANA VITRECTOMY WITH 25 GAUGE;  Surgeon: [name], MD;  Location: [facility];  Service: Ophthalmology;  Laterality: Left;  . PARS PLANA VITRECTOMY W/ SCLERAL BUCKLE Left 08/19/2017   Procedure: 48 GAUGE PARS PLANA VITRECTOMY WITH SILCONE OIL REMOVAL;  Surgeon: [name], MD;  Location: [facility];  Service: Ophthalmology;  Laterality: Left;  . 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 TOTAL KNEE ARTHROPLASTY Left 10/19/2012   Procedure: LEFT TOTAL KNEE ARTHROPLASTY;  Surgeon: Magnus Sinning, MD;  Location: [facility];  Service: Orthopedics;  Laterality: Left;  [name] VITRECTOMY 25 GAUGE WITH SCLERAL BUCKLE Left 02/17/2017   Procedure: VITRECTOMY 25 GAUGE WITH SCLERAL BUCKLE membrane peel, injection of silicone oil, and endolaser photocoagulation.;  Surgeon: [name], MD;  Location: [facility];  Service: Ophthalmology;  Laterality: Left;    FAMILY HISTORY Family History  Problem Relation Age of Onset  . Diabetes Mother   . COPD Brother   . COPD Sister   . Amblyopia Neg Hx   . Blindness Neg Hx   . Cataracts Neg Hx   . Glaucoma Neg Hx   . Macular degeneration Neg Hx   . Retinal detachment Neg Hx   . Retinitis pigmentosa Neg Hx     SOCIAL HISTORY Social History   Tobacco Use  . Smoking status: Former Smoker    Packs/day: 2.00    Years: 40.00    Pack years: 80.00    Types: Cigarettes, Pipe    Last attempt to quit: 06/09/1995    Years since quitting: 22.8  . Smokeless tobacco: Never Used   Substance Use Topics  . Alcohol use: No  . Drug use: No         OPHTHALMIC EXAM:  Base Eye Exam    Visual Acuity (Snellen - Linear)      Right Left   Dist [place] 20/30+2 20/150   Dist ph [place] 20/25 20/100 -2       Tonometry (Tonopen, 8:12 AM)      Right Left   Pressure 19 14       Pupils      Dark Light Shape React APD   Right 3 2 Round Slow None   Left 5 5  Round Minimal None       Visual Fields (Counting fingers)      Left Right    Full Full       Extraocular Movement      Right Left    Full, Ortho Full, Ortho       Neuro/Psych    Oriented x3:  Yes   Mood/Affect:  Normal       Dilation    Both eyes:  1.0% Mydriacyl, 2.5% Phenylephrine @ 8:12 AM        Slit Lamp and Fundus Exam    External Exam      Right Left   External Normal Periorbital edema improving       Slit Lamp Exam      Right Left   Lids/Lashes Dermatochalasis - upper lid, Telangiectasia Dermatochalasis - upper lid   Conjunctiva/Sclera White and quiet Sutures intact, no injection   Cornea Clear 1+ Descemet's folds, trace edema, endo pigment, 1+ Punctate epithelial erosions   Anterior Chamber Deep and quiet Deep, 1/2+ pigment   Iris Round and dilated Round and dilated - dilation 106m   Lens Three piece Posterior chamber intraocular lens Three piece Posterior chamber intraocular lens in good position, micro condensations on posterior lens surface   Vitreous Vitreous syneresis Post vitrectomy       Fundus Exam      Right Left   Disc Temporal and Nasal Peripapillary pigment  Sharp rim, trace pallor   C/D Ratio 0.45 0.45   Macula Flat; , Retinal pigment epithelial mottling - mild, No heme or edema slightly hazy view; flat; attached, Blunted foveal reflex, mild inc in cystic changes    Vessels Copper wiring Vascular attenuation - mild   Periphery Attached, Flat; Scattered Reticular degeneration; no RT or RD moderate buckle height; temporal/inf retinectomy from 2-630 with peripheral edge attached  with good laser at the edge; trace areas of fibrosis inferiorly and inf temporal -- surrounded by good laser        Refraction    Manifest Refraction      Sphere Cylinder Axis Dist VA   Right -0.25 +1.25 145 20/20   Left -1.25 +0.75 180 20/100          IMAGING AND PROCEDURES  Imaging and Procedures for 09/19/17  OCT, Retina - OU - Both Eyes       Right Eye Quality was good. Central Foveal Thickness: 249. Progression has been stable. Findings include normal foveal contour, no IRF, no SRF.   Left Eye Quality was good. Central Foveal Thickness: 303. Progression has been stable. Findings include no SRF, normal foveal contour, epiretinal membrane, intraretinal fluid (Patchy ORA, mild interval increase in IRF).   Notes Images taken, stored on drive  Diagnosis / Impression:  OD: NFP, No IRF, No SRF OS: retina attached with oil out - interval increase in IRF  Clinical management:  See below  Abbreviations: NFP - Normal foveal profile. CME - cystoid macular edema. PED - pigment epithelial detachment. IRF - intraretinal fluid. SRF - subretinal fluid. EZ - ellipsoid zone. ERM - epiretinal membrane. ORA - outer retinal atrophy. ORT - outer retinal tubulation. SRHM - subretinal hyper-reflective material                  ASSESSMENT/PLAN:    ICD-10-CM   1. 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 Retinal edema H35.81 OCT, Retina - OU - Both Eyes  4. Pseudophakia,  both eyes Z96.1   5. Corneal edema of left eye H18.20   6. Pseudophakia of left eye Z96.1     1-3. 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 - inferior detachment from 2-10 oclock with HST at ~530 - fovea off, but sup nasal macula attached - by history RD likely started 4-5 wks prior to initial presentation, but fovea became involved ~Saturday, 02/13/17 - s/p SBP (42 band) + 25g PPV/PFC/EL/FAX/SO OS 9.12.18 - extensive PVR along inf temp arcades with  tractional redetachment inf temp noted POM1 - s/p PPV w/ membrane peel/relaxing retinectomy/PFC/SOX OS under GA, 10.15.18 - now POM4 s/p PPV/SOR/EL OS, 3.14.19 - mild persistent corneal edema - retina attached and in good position -- mild interval increase in cystic changes/IRF - continue PF QID OS - start ketorolac QID OS - IOP 14 today -- good off [place] P - cont PSO ung PRN OS - f/u 8 weeks  4. Pseudophakia OU - IOLs in good position OU - monitor  5. Corneal edema OS - persistent post op corneal edema OS as above - will refer to cornea specialist for evaluation and management if continues to persist  Ophthalmic Meds Ordered this visit:  Meds ordered this encounter  Medications  . ketorolac (ACULAR) 0.5 % ophthalmic solution    Sig: Place 1 drop into the left eye 4 (four) times daily.    Dispense:  10 mL    Refill:  3       Return in about 8 weeks (around 05/26/2018) for F/U RD, DFE, OCT.  There are no Patient Instructions on file for this visit.   Explained the diagnoses, plan, and follow up with the patient and they expressed understanding.  Patient expressed understanding of the importance of proper follow up care.  This document serves as a record of services personally performed by Gardiner Sleeper, MD, PhD. It was created on their behalf by Ernest Mallick, OA, an ophthalmic assistant. The creation of this record is the provider's dictation and/or activities during the visit.    Electronically signed by: Ernest Mallick, OA  10.23.19 1:23 PM    Gardiner Sleeper, M.D., Ph.D. Diseases & Surgery of the Retina and Vitreous [facility]   I have reviewed the above documentation for accuracy and completeness, and I agree with the above. Gardiner Sleeper, M.D., Ph.D. 03/31/18 1:24 PM    Abbreviations: M myopia (nearsighted); A astigmatism; H hyperopia (farsighted); P presbyopia; Mrx spectacle prescription;  CTL contact lenses; OD right eye; OS left  eye; OU both eyes  XT exotropia; ET esotropia; PEK punctate epithelial keratitis; PEE punctate epithelial erosions; DES dry eye syndrome; MGD meibomian gland dysfunction; ATs artificial tears; PFAT's preservative free artificial tears; [place] nuclear sclerotic cataract; PSC posterior subcapsular cataract; ERM epi-retinal membrane; PVD posterior vitreous detachment; RD retinal detachment; DM diabetes mellitus; DR diabetic retinopathy; NPDR non-proliferative diabetic retinopathy; PDR proliferative diabetic retinopathy; CSME clinically significant macular edema; DME diabetic macular edema; dbh dot blot hemorrhages; CWS cotton wool spot; POAG primary open angle glaucoma; C/D cup-to-disc ratio; HVF humphrey visual field; GVF goldmann visual field; OCT optical coherence tomography; IOP intraocular pressure; BRVO Branch retinal vein occlusion; CRVO central retinal vein occlusion; CRAO central retinal artery occlusion; BRAO branch retinal artery occlusion; RT retinal tear; SB scleral buckle; PPV pars plana vitrectomy; VH Vitreous hemorrhage; PRP panretinal laser photocoagulation; IVK intravitreal kenalog; VMT vitreomacular traction; MH Macular hole;  NVD neovascularization of the disc; NVE neovascularization elsewhere; AREDS age related  eye disease study; ARMD age related macular degeneration; POAG primary open angle glaucoma; EBMD epithelial/anterior basement membrane dystrophy; ACIOL anterior chamber intraocular lens; IOL intraocular lens; PCIOL posterior chamber intraocular lens; Phaco/IOL phacoemulsification with intraocular lens placement; [place] photorefractive keratectomy; LASIK laser assisted in situ keratomileusis; HTN hypertension; DM diabetes mellitus; COPD chronic obstructive pulmonary disease

## 2018-03-31 ENCOUNTER — Encounter (INDEPENDENT_AMBULATORY_CARE_PROVIDER_SITE_OTHER): Payer: Self-pay | Admitting: Ophthalmology

## 2018-03-31 ENCOUNTER — Ambulatory Visit (INDEPENDENT_AMBULATORY_CARE_PROVIDER_SITE_OTHER): Payer: Medicare HMO | Admitting: Otolaryngology

## 2018-03-31 ENCOUNTER — Ambulatory Visit (INDEPENDENT_AMBULATORY_CARE_PROVIDER_SITE_OTHER): Payer: Medicare HMO | Admitting: Ophthalmology

## 2018-03-31 DIAGNOSIS — H1820 Unspecified corneal edema: Secondary | ICD-10-CM

## 2018-03-31 DIAGNOSIS — Z961 Presence of intraocular lens: Secondary | ICD-10-CM | POA: Diagnosis not present

## 2018-03-31 DIAGNOSIS — H3322 Serous retinal detachment, left eye: Secondary | ICD-10-CM

## 2018-03-31 DIAGNOSIS — M25511 Pain in right shoulder: Secondary | ICD-10-CM | POA: Diagnosis not present

## 2018-03-31 DIAGNOSIS — D37030 Neoplasm of uncertain behavior of the parotid salivary glands: Secondary | ICD-10-CM | POA: Diagnosis not present

## 2018-03-31 DIAGNOSIS — H3522 Other non-diabetic proliferative retinopathy, left eye: Secondary | ICD-10-CM

## 2018-03-31 DIAGNOSIS — H3581 Retinal edema: Secondary | ICD-10-CM

## 2018-03-31 MED ORDER — KETOROLAC TROMETHAMINE 0.5 % OP SOLN: 1.0000 [drp] | Freq: Four times a day (QID) | OPHTHALMIC | 3 refills | Status: AC

## 2018-04-21 DIAGNOSIS — S2231XA Fracture of one rib, right side, initial encounter for closed fracture: Secondary | ICD-10-CM | POA: Diagnosis not present

## 2018-04-25 ENCOUNTER — Ambulatory Visit: Payer: Medicare HMO | Admitting: Pulmonary Disease

## 2018-04-25 ENCOUNTER — Ambulatory Visit (INDEPENDENT_AMBULATORY_CARE_PROVIDER_SITE_OTHER)
Admission: RE | Admit: 2018-04-25 | Discharge: 2018-04-25 | Disposition: A | Discharge status: Home / Self Care | Payer: Medicare HMO | Source: Ambulatory Visit | Attending: Pulmonary Disease | Admitting: Pulmonary Disease

## 2018-04-25 ENCOUNTER — Encounter: Payer: Self-pay | Admitting: Pulmonary Disease

## 2018-04-25 VITALS — BP 122/70 | HR 79 | Ht 72.0 in | Wt 193.0 lb

## 2018-04-25 DIAGNOSIS — J449 Chronic obstructive pulmonary disease, unspecified: Secondary | ICD-10-CM

## 2018-04-25 DIAGNOSIS — S2231XA Fracture of one rib, right side, initial encounter for closed fracture: Secondary | ICD-10-CM | POA: Diagnosis not present

## 2018-04-25 NOTE — Patient Instructions (Signed)
I am glad you are doing well after your fall We will get a chest x-ray today to make sure there is no acute abnormality Continue the trelegy inhaler We will check your oxygen levels on exertion today  Follow-up in 6 months.

## 2018-04-25 NOTE — Progress Notes (Signed)
Jerome Mays    [id number]    05/20/1941  Primary Care Physician:Aronson, Delfino Lovett, MD  Referring Physician: [name], MD [address] [place], [place] [address]  Chief complaint: Follow-up for COPD GOLD B  HPI: Mr Grape is a 77 Y/O with Gold stage B COPD (CAT score 8, 1 exacerbation). He has 80-pack-year smoking history.  Quit in 1997.  Symptoms of cough, chest tightness, congestion for the past week after he was exposed to pollen and dust while mowing hay. He denies any fevers, chills, purulent sputum. He feels that the Daliresp is affecting his sleep and he is taking zzquil at night to fall asleep. He had an evaluation in January 2018 for minor hemoptysis. He had a CT scan of the chest which did not show any obvious source of hemoptysis.   Taken off Daliresp in 2018 as he cannot afford the cost and due to side effects of insomnia.  Interim History: Breathing is stable since last visit.  Has chronic dyspnea on exertion.  Mild cough with sputum production No wheezing, hemoptysis.  Maintained on trelegy.  He had a fall last week.  Evaluated by urgent care with x-ray which showed a rib fracture. 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  Resection is planned in January.  Outpatient Encounter Medications as of 04/25/2018  Medication Sig  . allopurinol (ZYLOPRIM) 300 MG tablet Take 300 mg by mouth daily.   [name] aspirin 81 MG tablet aspirin  . aspirin EC 81 MG tablet Take 81 mg by mouth daily.  . diphenhydramine-acetaminophen (TYLENOL PM) 25-500 MG TABS tablet Take 2 tablets by mouth at bedtime as needed (sleep).   [name] ketorolac (ACULAR) 0.5 % ophthalmic solution Place 1 drop into the left eye 4 (four) times daily.  . Loteprednol Etabonate (INVELTYS) 1 % SUSP Apply 1 drop to eye 2 (two) times daily. Instill one drop into the left eye twice daily  . Multiple Vitamin (ONE-A-DAY MENS PO) Take 1 tablet by mouth daily.  . Multiple Vitamins-Minerals  (DAILY MULTI VITAMIN/MINERALS PO) Daily Multi-Vitamin  . omeprazole (PRILOSEC) 20 MG capsule   . Polyethyl Glycol-Propyl Glycol (SYSTANE OP) Place 1 drop into both eyes as needed (for dry eyes).  . prednisoLONE acetate (PRED FORTE) 1 % ophthalmic suspension Place 1 drop into the left eye 4 (four) times daily.  . TRELEGY ELLIPTA 100-62.5-25 MCG/INH AEPB INHALE 1 PUFF ONCE DAILY   No facility-administered encounter medications on file as of 04/25/2018.    Physical Exam: Blood pressure 122/70, pulse 79, height 6' (1.829 m), weight 193 lb (87.5 kg), SpO2 92 %. Gen:      No acute distress HEENT:  EOMI, sclera anicteric Neck:     No masses; no thyromegaly Lungs:    Clear to auscultation bilaterally; normal respiratory effort CV:         Regular rate and rhythm; no murmurs Abd:      + bowel sounds; soft, non-tender; no palpable masses, no distension Ext:    No edema; adequate peripheral perfusion Skin:      Warm and dry; no rash Neuro: alert and oriented x 3 Psych: normal mood and affect  Data Reviewed: Imaging CT scan 10/21/09- Moderate centrilobar emphysema. No pulmonary masses, opacities, asbestos disease or consolidation. Stable enlarged. Stable enlarged mediastinal and bilateral hilar lymph nodes unchanged for past 3.5 years compatible with benign or reactive process.  CT scan 06/19/16-emphysema, no consolidation or lung opacity.  Mediastinal and bilateral hilar lymphadenopathy stable in size. Clustered left upper lobe nodules.  CT scan 07/01/17- stable subcentimeter pulmonary nodules, stable calcified mediastinal, hilar lymphadenopathy.  Chest x-ray 04/21/2018- Right anterior first lobe nondisplaced fracture.  No pleural effusion or pneumothorax.  Lungs are clear. I have reviewed the images personally.  PFTs 02/04/16 FVC 4.05 [9%), FEV1 2.40 (70%), F/F 59, TLC 101%, DLCO 35% Moderate obstructive defect with severe reduction in diffusion capacity.  Labs CBC 05/21/17-WBC 7.2, eosinophils  3.7%, absolute eos count 266 Blood allergy profile 05/21/17-IgE 22, RAST panel is negative.     Assessment:  Gold stage B COPD. Symptoms are stable on trelegy inhaler. He was previously maintained on Daliresp.  Seems to be doing well after this was stopped.  We will continue to monitor symptoms. Sats remained above 88% on exertion today.  Planned ENT surgery for parotid mass. He is at mildly increased risk of perioperative complication.  COPD is under good control No contraindications to surgery.  Recommend early mobilization, use of incentive spirometry.  Rib fracture after fall  X-ray at urgent care did not show any acute pulmonary abnormality.  Get repeat x-ray today to make sure there is no associated effusion. Continue topical pain relievers and supportive care.  Minor hemoptysis, in early 2018 Subcm pulm nodules He has no recurrence of hemoptysis and no obvious source on CT scan. He has new tiny left upper lobe nodules and mediastinal LNs which has remained stable on follow up CT.  Health maintenance 02/14/2018-influenza 01/08/2014-Prevnar 13 06/09/2007-Pneumovax  Plan/Recommendations: - Continue trelegy inhaler - X-ray - Cleared for parotid surgery.  Follow up in 6 months  Marshell Garfinkel MD [facility] 04/25/2018, 8:58 AM  CC: [name], MD

## 2018-04-26 ENCOUNTER — Telehealth: Payer: Self-pay | Admitting: Pulmonary Disease

## 2018-04-26 NOTE — Telephone Encounter (Signed)
Dr. Vaughan Browner, can you review pt's xray. If they dont answer, wife would like [place] to leave the results on the VM.

## 2018-04-27 NOTE — Telephone Encounter (Signed)
Dr. Vaughan Browner, please advise on this for pt as he is wanting to know the results from the cxr that was performed 04/25/18. Thanks!

## 2018-04-28 NOTE — Telephone Encounter (Signed)
Attempted to contact pt. I did not receive an answer. I have left a message for pt to return our call.  

## 2018-04-28 NOTE — Telephone Encounter (Signed)
Patient wife returned phone call ° °

## 2018-04-28 NOTE — Telephone Encounter (Signed)
Chest x-ray shows the rib fracture and COPD changes.  There are no acute findings pneumonia or fluid accumulation. Continue current management.

## 2018-04-28 NOTE — Telephone Encounter (Signed)
Spoke with pt's wife. She is aware of results. Nothing further was needed. 

## 2018-05-04 DIAGNOSIS — M25511 Pain in right shoulder: Secondary | ICD-10-CM | POA: Diagnosis not present

## 2018-05-16 DIAGNOSIS — M25511 Pain in right shoulder: Secondary | ICD-10-CM | POA: Diagnosis not present

## 2018-05-23 DIAGNOSIS — M25511 Pain in right shoulder: Secondary | ICD-10-CM | POA: Diagnosis not present

## 2018-05-25 NOTE — Progress Notes (Signed)
[facility] Note  05/26/2018     CHIEF COMPLAINT Patient presents for Retina Follow Up   HISTORY OF PRESENT ILLNESS: Jerome Mays is a 77 y.o. male who presents to the clinic today for:   HPI    Retina Follow Up    Patient presents with  Retinal Break/Detachment.  In left eye.  Severity is severe.  Duration of 8 weeks.  I, the attending physician,  performed the HPI with the patient and updated documentation appropriately.          Comments    S/P SBP, membrane peel, PPV OS-Patient states vision the same in both eyes. Using PF and ketorolac QID OS.        Last edited by [name], MD on 05/26/2018  8:23 AM. (History)    pt states his left eye is doing well, he states he is not having any problems with it. Scheduled to undergo right rotator cuff surgery and cyst removal from neck in January   Referring physician: Rutherford Guys, [place], [place] [id number]  HISTORICAL INFORMATION:   Selected notes from the MEDICAL RECORD NUMBER Initial referral from Dr. [name] for RD OS s/p SBP (42 band) + 25g PPV/PFC/EL/FAX/SO OS 9.12.18 s/p PPV w/ membrane peel/relaxing retinectomy/PFC/SOX OS under GA, 10.15.18   CURRENT MEDICATIONS: Current Outpatient Medications (Ophthalmic Drugs)  Medication Sig  . ketorolac (ACULAR) 0.5 % ophthalmic solution Place 1 drop into the left eye 4 (four) times daily.  . prednisoLONE acetate (PRED FORTE) 1 % ophthalmic suspension Place 1 drop into the left eye 4 (four) times daily.  . Loteprednol Etabonate (INVELTYS) 1 % SUSP Apply 1 drop to eye 2 (two) times daily. Instill one drop into the left eye twice daily (Patient not taking: Reported on 05/26/2018)  . Polyethyl Glycol-Propyl Glycol (SYSTANE OP) Place 1 drop into both eyes as needed (for dry eyes).   No current facility-administered medications for this visit.  (Ophthalmic Drugs)   Current Outpatient Medications (Other)  Medication Sig  .  allopurinol (ZYLOPRIM) 300 MG tablet Take 300 mg by mouth daily.   [name] aspirin EC 81 MG tablet Take 81 mg by mouth daily.  . diphenhydramine-acetaminophen (TYLENOL PM) 25-500 MG TABS tablet Take 2 tablets by mouth at bedtime as needed (sleep).   . Multiple Vitamin (ONE-A-DAY MENS PO) Take 1 tablet by mouth daily.  [name] omeprazole (PRILOSEC) 20 MG capsule   . TRELEGY ELLIPTA 100-62.5-25 MCG/INH AEPB INHALE 1 PUFF ONCE DAILY  . aspirin 81 MG tablet aspirin  . Multiple Vitamins-Minerals (DAILY MULTI VITAMIN/MINERALS PO) Daily Multi-Vitamin   No current facility-administered medications for this visit.  (Other)      REVIEW OF SYSTEMS: ROS    Positive for: Cardiovascular, Eyes   Negative for: Constitutional, Gastrointestinal, Neurological, Skin, Genitourinary, Musculoskeletal, HENT, Endocrine, Respiratory, Psychiatric, Allergic/Imm, Heme/Lymph   Last edited by Roselee Nova D on 05/26/2018  7:40 AM. (History)       ALLERGIES Allergies  Allergen Reactions  . Lipitor [Atorvastatin] Rash and Other (See Comments)    Passed out    PAST MEDICAL HISTORY Past Medical History:  Diagnosis Date  . Anemia    low iron  . Arthritis   . Colonic polyp   . Constipation   . COPD (chronic obstructive pulmonary disease) ([place])   . GERD (gastroesophageal reflux disease)   . Gout   . History of kidney stones   . Malignant tumor of kidney ([place])   .  Pain    LOWER BACK AND LEFT HIP - HX OF PREVIOUS LUMBAR AND CERVICAL SURGERY--PT STATES HE HAS HAD NUMBNESS IN BOTH FEET SINCE HIS BACK SURGERY  . Renal calculus   . Retinal detachment   . Shortness of breath    Past Surgical History:  Procedure Laterality Date  . BACK SURGERY     LUMBAR SURGERY  . CATARACT EXTRACTION Bilateral 2003   Surgeon unknown  . EYE SURGERY     BILATERAL CATARACT EXTRACTIONS  . GIVENS CAPSULE STUDY N/A 10/07/2015   Procedure: GIVENS CAPSULE STUDY;  Surgeon: Carol Ada, MD;  Location: [facility];  Service: Endoscopy;   Laterality: N/A;  . HAND SURGERY     left  . KNEE SURGERY     right  . LAMINECTOMY     cervical  . LASER PHOTO ABLATION Left 08/19/2017   Procedure: LASER PHOTO ABLATION;  Surgeon: [name], MD;  Location: [facility];  Service: Ophthalmology;  Laterality: Left;  . MEMBRANE PEEL Left 03/22/2017   Procedure: POSSIBLE MEMBRANE PEEL WITH SILICONE OIL AND PERFLURON;  Surgeon: [name], MD;  Location: [facility];  Service: Ophthalmology;  Laterality: Left;  . NEPHRECTOMY     PT STATES RIGHT KIDNEY WAS REMOVED  . PARS PLANA VITRECTOMY Left 03/22/2017   Procedure: PARS PLANA VITRECTOMY WITH 25 GAUGE;  Surgeon: [name], MD;  Location: [facility];  Service: Ophthalmology;  Laterality: Left;  . PARS PLANA VITRECTOMY W/ SCLERAL BUCKLE Left 08/19/2017   Procedure: 79 GAUGE PARS PLANA VITRECTOMY WITH SILCONE OIL REMOVAL;  Surgeon: [name], MD;  Location: [facility];  Service: Ophthalmology;  Laterality: Left;  . 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 TOTAL KNEE ARTHROPLASTY Left 10/19/2012   Procedure: LEFT TOTAL KNEE ARTHROPLASTY;  Surgeon: Magnus Sinning, MD;  Location: [facility];  Service: Orthopedics;  Laterality: Left;  [name] VITRECTOMY 25 GAUGE WITH SCLERAL BUCKLE Left 02/17/2017   Procedure: VITRECTOMY 25 GAUGE WITH SCLERAL BUCKLE membrane peel, injection of silicone oil, and endolaser photocoagulation.;  Surgeon: [name], MD;  Location: [facility];  Service: Ophthalmology;  Laterality: Left;    FAMILY HISTORY Family History  Problem Relation Age of Onset  . Diabetes Mother   . COPD Brother   . COPD Sister   . Amblyopia Neg Hx   . Blindness Neg Hx   . Cataracts Neg Hx   . Glaucoma Neg Hx   . Macular degeneration Neg Hx   . Retinal detachment Neg Hx   . Retinitis pigmentosa Neg Hx     SOCIAL HISTORY Social History   Tobacco Use  . Smoking status: Former Smoker    Packs/day: 2.00    Years: 40.00    Pack years: 80.00    Types: Cigarettes, Pipe    Last attempt to quit: 06/09/1995     Years since quitting: 22.9  . Smokeless tobacco: Never Used  Substance Use Topics  . Alcohol use: No  . Drug use: No         OPHTHALMIC EXAM:  Base Eye Exam    Visual Acuity (Snellen - Linear)      Right Left   Dist [place] 20/25 -2 20/80 -3   Dist ph [place] 20/25 -1 NI   Correction:  Glasses       Tonometry (Tonopen, 7:50 AM)      Right Left   Pressure 22 17       Pupils      Dark Light Shape React APD  Right 3 2 Round Brisk None   Left 6 6 Round Minimal None       Visual Fields (Counting fingers)      Left Right    Full Full       Extraocular Movement      Right Left    Full, Ortho Full, Ortho       Neuro/Psych    Oriented x3:  Yes   Mood/Affect:  Normal       Dilation    Both eyes:  1.0% Mydriacyl, 2.5% Phenylephrine @ 7:50 AM        Slit Lamp and Fundus Exam    External Exam      Right Left   External Normal Periorbital edema improving       Slit Lamp Exam      Right Left   Lids/Lashes Dermatochalasis - upper lid, Telangiectasia Dermatochalasis - upper lid   Conjunctiva/Sclera White and quiet healed, no injection   Cornea Clear 1+ Descemet's folds, Arcus, Debris in tear film   Anterior Chamber Deep and quiet Deep, 1/2+ pigment   Iris Round and dilated Round and dilated    Lens Three piece Posterior chamber intraocular lens Three piece Posterior chamber intraocular lens in good position, micro condensations on posterior lens surface   Vitreous Vitreous syneresis Post vitrectomy       Fundus Exam      Right Left   Disc Pink and Sharp, mild Peripapillary atrophy Sharp rim, mild pallor   C/D Ratio 0.5 0.6   Macula Flat, blunted foveal reflex, Retinal pigment epithelial mottling - mild, No heme or edema flat; Blunted foveal reflex   Vessels Copper wiring Vascular attenuation   Periphery Attached, mild Reticular degeneration; no RT or RD moderate buckle height; temporal/inf retinectomy from 2-630 with peripheral edge attached with good laser at the  edge; trace areas of fibrosis inferiorly and inf temporal -- surrounded by good laser          IMAGING AND PROCEDURES  Imaging and Procedures for 09/19/17  OCT, Retina - OU - Both Eyes       Right Eye Quality was good. Central Foveal Thickness: 250. Progression has been stable. Findings include normal foveal contour, no IRF, no SRF.   Left Eye Quality was good. Central Foveal Thickness: 322. Progression has been stable. Findings include no SRF, epiretinal membrane, intraretinal fluid, abnormal foveal contour (Patchy ORA, mild persistent IRF).   Notes Images taken, stored on drive  Diagnosis / Impression:  OD: NFP, No IRF, No SRF OS: retina attached with oil out - mild persistent IRF  Clinical management:  See below  Abbreviations: NFP - Normal foveal profile. CME - cystoid macular edema. PED - pigment epithelial detachment. IRF - intraretinal fluid. SRF - subretinal fluid. EZ - ellipsoid zone. ERM - epiretinal membrane. ORA - outer retinal atrophy. ORT - outer retinal tubulation. SRHM - subretinal hyper-reflective material                  ASSESSMENT/PLAN:    ICD-10-CM   1. 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 Retinal edema H35.81 OCT, Retina - OU - Both Eyes  4. 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 Corneal edema of left eye H18.20   6. Pseudophakia of left eye Z96.1     1-3. 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 - inferior detachment from 2-10 oclock with HST at ~530 - fovea off, but  sup nasal macula attached - by history RD likely started 4-5 wks prior to initial presentation, but fovea became involved ~Saturday, 02/13/17 - s/p SBP (42 band) + 25g PPV/PFC/EL/FAX/SO OS 9.12.18 - extensive PVR along inf temp arcades with tractional redetachment inf temp noted POM1 - s/p PPV w/ membrane peel/relaxing retinectomy/PFC/SOX OS under GA, 10.15.18 - s/p PPV/SOR/EL OS, 3.14.19 - mild persistent corneal  edema - retina attached and in good position -- mild persistent cystic changes/IRF - continue PF TID OS and ketorolac TID OS - IOP 17 today -- good off [place] P - cont PSO ung PRN OS - f/u 3 months  4. Pseudophakia OU - IOLs in good position OU - monitor  5. Corneal edema OS - persistent post op corneal edema OS as above - will refer to cornea specialist for evaluation and management if continues to persist  Ophthalmic Meds Ordered this visit:  No orders of the defined types were placed in this encounter.      Return in about 3 months (around 08/25/2018) for F/U RD OS, DFE, OCT.  There are no Patient Instructions on file for this visit.   Explained the diagnoses, plan, and follow up with the patient and they expressed understanding.  Patient expressed understanding of the importance of proper follow up care.  This document serves as a record of services personally performed by Gardiner Sleeper, MD, PhD. It was created on their behalf by Ernest Mallick, OA, an ophthalmic assistant. The creation of this record is the provider's dictation and/or activities during the visit.    Electronically signed by: Ernest Mallick, OA  12.18.19 12:31 AM    Gardiner Sleeper, M.D., Ph.D. Diseases & Surgery of the Retina and Vitreous [facility]  I have reviewed the above documentation for accuracy and completeness, and I agree with the above. Gardiner Sleeper, M.D., Ph.D. 05/28/18 12:36 AM     Abbreviations: M myopia (nearsighted); A astigmatism; H hyperopia (farsighted); P presbyopia; Mrx spectacle prescription;  CTL contact lenses; OD right eye; OS left eye; OU both eyes  XT exotropia; ET esotropia; PEK punctate epithelial keratitis; PEE punctate epithelial erosions; DES dry eye syndrome; MGD meibomian gland dysfunction; ATs artificial tears; PFAT's preservative free artificial tears; [place] nuclear sclerotic cataract; PSC posterior subcapsular cataract; ERM epi-retinal  membrane; PVD posterior vitreous detachment; RD retinal detachment; DM diabetes mellitus; DR diabetic retinopathy; NPDR non-proliferative diabetic retinopathy; PDR proliferative diabetic retinopathy; CSME clinically significant macular edema; DME diabetic macular edema; dbh dot blot hemorrhages; CWS cotton wool spot; POAG primary open angle glaucoma; C/D cup-to-disc ratio; HVF humphrey visual field; GVF goldmann visual field; OCT optical coherence tomography; IOP intraocular pressure; BRVO Branch retinal vein occlusion; CRVO central retinal vein occlusion; CRAO central retinal artery occlusion; BRAO branch retinal artery occlusion; RT retinal tear; SB scleral buckle; PPV pars plana vitrectomy; VH Vitreous hemorrhage; PRP panretinal laser photocoagulation; IVK intravitreal kenalog; VMT vitreomacular traction; MH Macular hole;  NVD neovascularization of the disc; NVE neovascularization elsewhere; AREDS age related eye disease study; ARMD age related macular degeneration; POAG primary open angle glaucoma; EBMD epithelial/anterior basement membrane dystrophy; ACIOL anterior chamber intraocular lens; IOL intraocular lens; PCIOL posterior chamber intraocular lens; Phaco/IOL phacoemulsification with intraocular lens placement; [place] photorefractive keratectomy; LASIK laser assisted in situ keratomileusis; HTN hypertension; DM diabetes mellitus; COPD chronic obstructive pulmonary disease

## 2018-05-26 ENCOUNTER — Encounter (INDEPENDENT_AMBULATORY_CARE_PROVIDER_SITE_OTHER): Payer: Self-pay | Admitting: Ophthalmology

## 2018-05-26 ENCOUNTER — Ambulatory Visit (INDEPENDENT_AMBULATORY_CARE_PROVIDER_SITE_OTHER): Payer: Medicare HMO | Admitting: Ophthalmology

## 2018-05-26 DIAGNOSIS — H1820 Unspecified corneal edema: Secondary | ICD-10-CM

## 2018-05-26 DIAGNOSIS — H3581 Retinal edema: Secondary | ICD-10-CM | POA: Diagnosis not present

## 2018-05-26 DIAGNOSIS — Z961 Presence of intraocular lens: Secondary | ICD-10-CM | POA: Diagnosis not present

## 2018-05-26 DIAGNOSIS — H3322 Serous retinal detachment, left eye: Secondary | ICD-10-CM

## 2018-05-26 DIAGNOSIS — H3522 Other non-diabetic proliferative retinopathy, left eye: Secondary | ICD-10-CM | POA: Diagnosis not present

## 2018-05-28 ENCOUNTER — Encounter (INDEPENDENT_AMBULATORY_CARE_PROVIDER_SITE_OTHER): Payer: Self-pay | Admitting: Ophthalmology

## 2018-06-02 ENCOUNTER — Encounter (HOSPITAL_BASED_OUTPATIENT_CLINIC_OR_DEPARTMENT_OTHER): Payer: Self-pay | Admitting: *Deleted

## 2018-06-02 ENCOUNTER — Other Ambulatory Visit: Payer: Self-pay

## 2018-06-03 ENCOUNTER — Other Ambulatory Visit: Payer: Self-pay | Admitting: Otolaryngology

## 2018-06-08 HISTORY — PX: SHOULDER SURGERY: SHX246

## 2018-06-09 ENCOUNTER — Other Ambulatory Visit: Payer: Self-pay | Admitting: Otolaryngology

## 2018-06-13 ENCOUNTER — Ambulatory Visit (HOSPITAL_BASED_OUTPATIENT_CLINIC_OR_DEPARTMENT_OTHER)
Admission: RE | Admit: 2018-06-13 | Discharge: 2018-06-14 | Disposition: A | Discharge status: Home / Self Care | Payer: Medicare HMO | Attending: Otolaryngology | Admitting: Otolaryngology

## 2018-06-13 ENCOUNTER — Encounter (HOSPITAL_BASED_OUTPATIENT_CLINIC_OR_DEPARTMENT_OTHER)
Admission: RE | Disposition: A | Discharge status: Home / Self Care | Payer: Self-pay | Source: Home / Self Care | Attending: Otolaryngology

## 2018-06-13 ENCOUNTER — Encounter (HOSPITAL_BASED_OUTPATIENT_CLINIC_OR_DEPARTMENT_OTHER): Payer: Self-pay | Admitting: *Deleted

## 2018-06-13 ENCOUNTER — Ambulatory Visit (HOSPITAL_BASED_OUTPATIENT_CLINIC_OR_DEPARTMENT_OTHER): Payer: Medicare HMO | Admitting: Anesthesiology

## 2018-06-13 DIAGNOSIS — D110 Benign neoplasm of parotid gland: Secondary | ICD-10-CM | POA: Diagnosis not present

## 2018-06-13 DIAGNOSIS — K118 Other diseases of salivary glands: Secondary | ICD-10-CM | POA: Diagnosis not present

## 2018-06-13 DIAGNOSIS — Z9049 Acquired absence of other specified parts of digestive tract: Secondary | ICD-10-CM

## 2018-06-13 DIAGNOSIS — K119 Disease of salivary gland, unspecified: Secondary | ICD-10-CM | POA: Diagnosis present

## 2018-06-13 DIAGNOSIS — Z79899 Other long term (current) drug therapy: Secondary | ICD-10-CM | POA: Diagnosis not present

## 2018-06-13 DIAGNOSIS — Z87891 Personal history of nicotine dependence: Secondary | ICD-10-CM | POA: Insufficient documentation

## 2018-06-13 DIAGNOSIS — J449 Chronic obstructive pulmonary disease, unspecified: Secondary | ICD-10-CM | POA: Diagnosis not present

## 2018-06-13 DIAGNOSIS — Z7982 Long term (current) use of aspirin: Secondary | ICD-10-CM | POA: Insufficient documentation

## 2018-06-13 DIAGNOSIS — Z888 Allergy status to other drugs, medicaments and biological substances status: Secondary | ICD-10-CM | POA: Insufficient documentation

## 2018-06-13 DIAGNOSIS — D37030 Neoplasm of uncertain behavior of the parotid salivary glands: Secondary | ICD-10-CM | POA: Diagnosis not present

## 2018-06-13 HISTORY — PX: PAROTIDECTOMY: SHX2163

## 2018-06-13 SURGERY — EXCISION, PAROTID GLAND
Anesthesia: General | Site: Face | Laterality: Right

## 2018-06-13 MED ORDER — OXYCODONE-ACETAMINOPHEN 5-325 MG PO TABS: 1.0000 | ORAL_TABLET | ORAL | 0 refills | Status: DC | PRN

## 2018-06-13 MED ORDER — FENTANYL CITRATE (PF) 100 MCG/2ML IJ SOLN: 25.0000 ug | INTRAMUSCULAR | Status: DC | PRN

## 2018-06-13 MED ORDER — KCL IN DEXTROSE-NACL 20-5-0.45 MEQ/L-%-% IV SOLN
INTRAVENOUS | Status: DC
  Administered 2018-06-13: 13:00:00 via INTRAVENOUS
  Filled 2018-06-13: qty 1000

## 2018-06-13 MED ORDER — ALLOPURINOL 300 MG PO TABS: 300.0000 mg | ORAL_TABLET | Freq: Every day | ORAL | Status: DC

## 2018-06-13 MED ORDER — FENTANYL CITRATE (PF) 100 MCG/2ML IJ SOLN
50.0000 ug | INTRAMUSCULAR | Status: AC | PRN
  Administered 2018-06-13: 100 ug via INTRAVENOUS
  Administered 2018-06-13 (×2): 25 ug via INTRAVENOUS

## 2018-06-13 MED ORDER — EPHEDRINE SULFATE 50 MG/ML IJ SOLN
INTRAMUSCULAR | Status: DC | PRN
  Administered 2018-06-13: 15 mg via INTRAVENOUS
  Administered 2018-06-13: 5 mg via INTRAVENOUS
  Administered 2018-06-13 (×2): 10 mg via INTRAVENOUS

## 2018-06-13 MED ORDER — ONDANSETRON HCL 4 MG/2ML IJ SOLN
INTRAMUSCULAR | Status: DC | PRN
  Administered 2018-06-13: 4 mg via INTRAVENOUS

## 2018-06-13 MED ORDER — PHENYLEPHRINE HCL 10 MG/ML IJ SOLN
INTRAMUSCULAR | Status: DC | PRN
  Administered 2018-06-13 (×2): 80 ug via INTRAVENOUS

## 2018-06-13 MED ORDER — MORPHINE SULFATE (PF) 4 MG/ML IV SOLN: 2.0000 mg | INTRAVENOUS | Status: DC | PRN

## 2018-06-13 MED ORDER — LIDOCAINE HCL (CARDIAC) PF 100 MG/5ML IV SOSY
PREFILLED_SYRINGE | INTRAVENOUS | Status: DC | PRN
  Administered 2018-06-13: 100 mg via INTRAVENOUS

## 2018-06-13 MED ORDER — MIDAZOLAM HCL 2 MG/2ML IJ SOLN: 1.0000 mg | INTRAMUSCULAR | Status: DC | PRN

## 2018-06-13 MED ORDER — PREDNISOLONE ACETATE 1 % OP SUSP: 1.0000 [drp] | Freq: Four times a day (QID) | OPHTHALMIC | Status: DC

## 2018-06-13 MED ORDER — LACTATED RINGERS IV SOLN
INTRAVENOUS | Status: DC
  Administered 2018-06-13 (×2): via INTRAVENOUS

## 2018-06-13 MED ORDER — SCOPOLAMINE 1 MG/3DAYS TD PT72: 1.0000 | MEDICATED_PATCH | Freq: Once | TRANSDERMAL | Status: DC | PRN

## 2018-06-13 MED ORDER — FENTANYL CITRATE (PF) 100 MCG/2ML IJ SOLN
INTRAMUSCULAR | Status: AC
  Filled 2018-06-13: qty 2

## 2018-06-13 MED ORDER — LIDOCAINE-EPINEPHRINE 1 %-1:100000 IJ SOLN
INTRAMUSCULAR | Status: DC | PRN
  Administered 2018-06-13: 7 mL

## 2018-06-13 MED ORDER — SUCCINYLCHOLINE CHLORIDE 20 MG/ML IJ SOLN
INTRAMUSCULAR | Status: DC | PRN
  Administered 2018-06-13: 140 mg via INTRAVENOUS

## 2018-06-13 MED ORDER — OXYCODONE-ACETAMINOPHEN 5-325 MG PO TABS
1.0000 | ORAL_TABLET | ORAL | Status: DC | PRN
  Administered 2018-06-13 – 2018-06-14 (×2): 1 via ORAL
  Filled 2018-06-13 (×2): qty 1

## 2018-06-13 MED ORDER — DEXAMETHASONE SODIUM PHOSPHATE 4 MG/ML IJ SOLN
INTRAMUSCULAR | Status: DC | PRN
  Administered 2018-06-13: 10 mg via INTRAVENOUS

## 2018-06-13 MED ORDER — ACETAMINOPHEN 160 MG/5ML PO SOLN: 650.0000 mg | ORAL | Status: DC | PRN

## 2018-06-13 MED ORDER — ONDANSETRON HCL 4 MG/2ML IJ SOLN: 4.0000 mg | Freq: Once | INTRAMUSCULAR | Status: DC | PRN

## 2018-06-13 MED ORDER — KETOROLAC TROMETHAMINE 0.5 % OP SOLN: 1.0000 [drp] | Freq: Four times a day (QID) | OPHTHALMIC | Status: DC

## 2018-06-13 MED ORDER — FLUTICASONE-UMECLIDIN-VILANT 100-62.5-25 MCG/INH IN AEPB: 1.0000 | INHALATION_SPRAY | Freq: Every day | RESPIRATORY_TRACT | Status: DC

## 2018-06-13 MED ORDER — ONDANSETRON HCL 4 MG/2ML IJ SOLN: 4.0000 mg | INTRAMUSCULAR | Status: DC | PRN

## 2018-06-13 MED ORDER — ONDANSETRON HCL 4 MG PO TABS: 4.0000 mg | ORAL_TABLET | ORAL | Status: DC | PRN

## 2018-06-13 MED ORDER — AMOXICILLIN 875 MG PO TABS: 875.0000 mg | ORAL_TABLET | Freq: Two times a day (BID) | ORAL | 0 refills | Status: AC

## 2018-06-13 MED ORDER — CEFAZOLIN SODIUM-DEXTROSE 2-3 GM-%(50ML) IV SOLR
INTRAVENOUS | Status: DC | PRN
  Administered 2018-06-13: 2 g via INTRAVENOUS

## 2018-06-13 MED ORDER — PROPOFOL 10 MG/ML IV BOLUS
INTRAVENOUS | Status: DC | PRN
  Administered 2018-06-13: 150 mg via INTRAVENOUS
  Administered 2018-06-13: 50 mg via INTRAVENOUS

## 2018-06-13 MED ORDER — ACETAMINOPHEN 650 MG RE SUPP: 650.0000 mg | RECTAL | Status: DC | PRN

## 2018-06-13 SURGICAL SUPPLY — 69 items
APPLICATOR DR MATTHEWS STRL (MISCELLANEOUS) ×3 IMPLANT
ATTRACTOMAT 16X20 MAGNETIC DRP (DRAPES) IMPLANT
BLADE SURG 15 STRL LF DISP TIS (BLADE) ×1 IMPLANT
BLADE SURG 15 STRL SS (BLADE) ×2
CANISTER SUCT 1200ML W/VALVE (MISCELLANEOUS) ×3 IMPLANT
CORD BIPOLAR FORCEPS 12FT (ELECTRODE) ×3 IMPLANT
COTTONBALL LRG STERILE PKG (GAUZE/BANDAGES/DRESSINGS) ×3 IMPLANT
COVER BACK TABLE 60X90IN (DRAPES) ×3 IMPLANT
COVER MAYO STAND STRL (DRAPES) ×3 IMPLANT
COVER WAND RF STERILE (DRAPES) IMPLANT
DECANTER SPIKE VIAL GLASS SM (MISCELLANEOUS) ×1 IMPLANT
DERMABOND ADVANCED (GAUZE/BANDAGES/DRESSINGS) ×2
DERMABOND ADVANCED .7 DNX12 (GAUZE/BANDAGES/DRESSINGS) ×1 IMPLANT
DRAIN CHANNEL 10F 3/8 F FF (DRAIN) ×2 IMPLANT
DRAPE SURG 17X23 STRL (DRAPES) ×2 IMPLANT
DRAPE U-SHAPE 76X120 STRL (DRAPES) ×3 IMPLANT
ELECT COATED BLADE 2.86 ST (ELECTRODE) ×3 IMPLANT
ELECT PAIRED SUBDERMAL (MISCELLANEOUS) ×3
ELECT REM PT RETURN 9FT ADLT (ELECTROSURGICAL) ×3
ELECTRODE PAIRED SUBDERMAL (MISCELLANEOUS) ×1 IMPLANT
ELECTRODE REM PT RTRN 9FT ADLT (ELECTROSURGICAL) ×1 IMPLANT
EVACUATOR SILICONE 100CC (DRAIN) ×2 IMPLANT
FORCEPS BIPOLAR SPETZLER 8 1.0 (NEUROSURGERY SUPPLIES) ×3 IMPLANT
GAUZE 4X4 16PLY RFD (DISPOSABLE) ×5 IMPLANT
GLOVE BIO SURGEON STRL SZ 6.5 (GLOVE) ×2 IMPLANT
GLOVE BIO SURGEON STRL SZ7.5 (GLOVE) ×3 IMPLANT
GLOVE BIO SURGEONS STRL SZ 6.5 (GLOVE) ×2
GLOVE BIOGEL M STRL SZ7.5 (GLOVE) ×2 IMPLANT
GLOVE BIOGEL PI IND STRL 7.0 (GLOVE) IMPLANT
GLOVE BIOGEL PI IND STRL 7.5 (GLOVE) IMPLANT
GLOVE BIOGEL PI IND STRL 8 (GLOVE) IMPLANT
GLOVE BIOGEL PI INDICATOR 7.0 (GLOVE) ×4
GLOVE BIOGEL PI INDICATOR 7.5 (GLOVE) ×2
GLOVE BIOGEL PI INDICATOR 8 (GLOVE) ×2
GLOVE EXAM NITRILE MD LF STRL (GLOVE) ×2 IMPLANT
GLOVE SURG SS PI 7.0 STRL IVOR (GLOVE) ×2 IMPLANT
GOWN STRL REUS W/ TWL LRG LVL3 (GOWN DISPOSABLE) ×2 IMPLANT
GOWN STRL REUS W/ TWL XL LVL3 (GOWN DISPOSABLE) IMPLANT
GOWN STRL REUS W/TWL LRG LVL3 (GOWN DISPOSABLE) ×8
GOWN STRL REUS W/TWL XL LVL3 (GOWN DISPOSABLE) ×2
LOCATOR NERVE 3 VOLT (DISPOSABLE) IMPLANT
NDL HYPO 25X1 1.5 SAFETY (NEEDLE) ×1 IMPLANT
NEEDLE HYPO 25X1 1.5 SAFETY (NEEDLE) ×3 IMPLANT
NS IRRIG 1000ML POUR BTL (IV SOLUTION) ×3 IMPLANT
PACK BASIN DAY SURGERY FS (CUSTOM PROCEDURE TRAY) ×3 IMPLANT
PAD ALCOHOL SWAB (MISCELLANEOUS) ×6 IMPLANT
PENCIL BUTTON HOLSTER BLD 10FT (ELECTRODE) ×3 IMPLANT
PIN SAFETY STERILE (MISCELLANEOUS) ×2 IMPLANT
PROBE NERVBE PRASS .33 (MISCELLANEOUS) ×3 IMPLANT
SHEARS HARMONIC 9CM CVD (BLADE) ×3 IMPLANT
SLEEVE SCD COMPRESS KNEE MED (MISCELLANEOUS) ×3 IMPLANT
SPONGE INTESTINAL PEANUT (DISPOSABLE) ×3 IMPLANT
STAPLER VISISTAT 35W (STAPLE) IMPLANT
SUT ETHILON 3 0 PS 1 (SUTURE) ×2 IMPLANT
SUT PROLENE 5 0 P 3 (SUTURE) ×3 IMPLANT
SUT SILK 2 0 PERMA HAND 18 BK (SUTURE) ×9 IMPLANT
SUT SILK 2 0 TIES 17X18 (SUTURE)
SUT SILK 2-0 18XBRD TIE BLK (SUTURE) IMPLANT
SUT SILK 3 0 TIES 17X18 (SUTURE) ×2
SUT SILK 3-0 18XBRD TIE BLK (SUTURE) ×1 IMPLANT
SUT SILK 4 0 TIES 17X18 (SUTURE) IMPLANT
SUT VIC AB 3-0 FS2 27 (SUTURE) IMPLANT
SUT VICRYL 4-0 PS2 18IN ABS (SUTURE) ×3 IMPLANT
SYR BULB 3OZ (MISCELLANEOUS) ×3 IMPLANT
SYR CONTROL 10ML LL (SYRINGE) ×3 IMPLANT
TOWEL GREEN STERILE FF (TOWEL DISPOSABLE) ×3 IMPLANT
TRAY DSU PREP LF (CUSTOM PROCEDURE TRAY) ×3 IMPLANT
TUBE CONNECTING 20'X1/4 (TUBING) ×1
TUBE CONNECTING 20X1/4 (TUBING) ×2 IMPLANT

## 2018-06-13 NOTE — Anesthesia Preprocedure Evaluation (Signed)
Anesthesia Evaluation  Patient identified by MRN, date of birth, ID band Patient awake  General Assessment Comment:Right parotid mass  Reviewed: Allergy & Precautions, NPO status , Patient's Chart, lab work & pertinent test results  Airway Mallampati: II  TM Distance: >3 FB Neck ROM: Full    Dental  (+) Teeth Intact, Dental Advisory Given   Pulmonary COPD, former smoker,    Pulmonary exam normal breath sounds clear to auscultation       Cardiovascular negative cardio ROS Normal cardiovascular exam Rhythm:Regular Rate:Normal     Neuro/Psych negative neurological ROS     GI/Hepatic Neg liver ROS, GERD  ,  Endo/Other  negative endocrine ROS  Renal/GU Renal disease (Renal tumor)     Musculoskeletal  (+) Arthritis ,   Abdominal   Peds  Hematology negative hematology ROS (+)   Anesthesia Other Findings Day of surgery medications reviewed with the patient.  Reproductive/Obstetrics                             Anesthesia Physical Anesthesia Plan  ASA: III  Anesthesia Plan: General   Post-op Pain Management:    Induction: Intravenous  PONV Risk Score and Plan: 3 and Dexamethasone and Ondansetron  Airway Management Planned: Oral ETT  Additional Equipment:   Intra-op Plan:   Post-operative Plan: Extubation in OR  Informed Consent: I have reviewed the patients History and Physical, chart, labs and discussed the procedure including the risks, benefits and alternatives for the proposed anesthesia with the patient or authorized representative who has indicated his/her understanding and acceptance.   Dental advisory given  Plan Discussed with: CRNA  Anesthesia Plan Comments:         Anesthesia Quick Evaluation

## 2018-06-13 NOTE — Op Note (Signed)
DATE OF PROCEDURE:  06/13/2018                              OPERATIVE REPORT  SURGEON:  Leta Baptist, MD  PREOPERATIVE DIAGNOSES: 1. Right parotid mass  POSTOPERATIVE DIAGNOSES: 1. Right parotid mass  PROCEDURE PERFORMED:  Right total parotidectomy with facial nerve dissection and preservation.  ANESTHESIA:  General endotracheal tube anesthesia.  COMPLICATIONS:  None.  ESTIMATED BLOOD LOSS:  30 mL  INDICATION FOR PROCEDURE:  Jerome Mays is a 78 y.o. male with a history of a slowly enlarging right parotid mass. The patient recently underwent a neck CT scan. The CT showed a 3 cm encapsulated mass within the posterior right parotid gland. No lymphadenopathy was noted. The size of the parotid tumor had increased from his previous CT scan. The size of the tumor continued to enlarge since his CT scan. At the time of the surgery, it was noted to be 4 cm in diameter. Based on the above findings, the decision was made for the patient to undergo the above-stated procedure.  The risks, benefits, alternatives, and details of the procedure were discussed with the patient.  Questions were invited and answered.  Informed consent was obtained.  DESCRIPTION:  The patient was taken to the operating room and placed supine on the operating table.  General endotracheal tube anesthesia was administered by the anesthesiologist.  The patient was positioned and prepped and draped in a standard fashion for right parotid surgery.  Facial nerve monitoring electrodes were placed. The facial nerve monitoring system was functional throughout the case.  1% lidocaine with 1-100,000 epinephrine was infiltrated at the planned site of incision. A stented facelift incision was made on the right side. The SMAS skin flap was elevated in a standard fashion. A large 4 cm mass was noted within the right posterior parotid gland. Careful dissection was carried out anterior to the auricular cartilage. The main trunk of the facial nerve was  identified. Dissection along the facial nerve was performed to identify the branches of the facial nerves. All branches were preserved and functional throughout the case. At this point, the tumor was noted to span both the superficial and deep lobes of the parotid gland. The entire tumor was carefully dissected free from the facial nerve branches and the surrounding soft tissue. The entire tumor was sent to the pathology department for permanent histologic identification. The surgical site was copiously irrigated. A #10 JP drain was placed. The incision was closed in layers with 4-0 Vicryl, 5-0 Prolene, and Dermabond.  The care of the patient was turned over to the anesthesiologist.  The patient was awakened from anesthesia without difficulty.  The patient was extubated and transferred to the recovery room in good condition.  OPERATIVE FINDINGS: A 4 cm right parotid mass.  SPECIMEN: Right parotid mass.  FOLLOWUP CARE:  The patient will be observed overnight at the [facility] day surgery [facility]. The patient will follow up in my office in approximately 1 week.  Irelyn Perfecto W Loyd Salvador 06/13/2018 11:39 AM

## 2018-06-13 NOTE — H&P (Signed)
Cc: Right parotid mass  HPI: The patient is a 78 year old male who returns today for his follow-up evaluation.  He was last seen 1 month ago. At that time, he was noted to have a right parotid mass.  He underwent a neck CT scan.  The CT showed a 3 cm encapsulated mass within the posterior right parotid gland.  No lymphadenopathy was noted.  The appearance was suggestive of a benign tumor.   According to the patient, he has no significant pain or dysphagia.  He is tolerating oral intake well. No other ENT, GI, or respiratory issue noted since the last visit.   Exam: General: Communicates without difficulty, well nourished, no acute distress. Head: Normocephalic, no evidence injury, no tenderness, facial buttresses intact without stepoff. Eyes: PERRL, EOMI. No scleral icterus, conjunctivae clear. Neuro: CN II exam reveals vision grossly intact.  No nystagmus at any point of gaze. Ears: Auricles well formed without lesions.  Ear canals are intact without mass or lesion.  No erythema or edema is appreciated.  The TMs are intact without fluid. Nose: External evaluation reveals normal support and skin without lesions.  Dorsum is intact.  Anterior rhinoscopy reveals healthy pink mucosa over anterior aspect of inferior turbinates and intact septum.  No purulence noted. Oral:  Oral cavity and oropharynx are intact, symmetric, without erythema or edema.  Mucosa is moist without lesions. Neck: Full range of motion without pain.  There is no significant lymphadenopathy.  No masses palpable.  Thyroid bed within normal limits to palpation.  Right Parotid mass palpable. Submandibular glands equal bilaterally without mass.  Trachea is midline. Neuro:  CN 2-12 grossly intact. Gait normal. Vestibular: No nystagmus at any point of gaze. The cerebellar examination is unremarkable.   Assessment 1.  The patient has a 3 cm right posterior parotid mass.  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  No other pathology or  adenopathy is noted on his CT scan.   Plan  1.  The physical exam findings and the CT images are reviewed with the patient.   2.  The treatment options are also discussed.  The options include conservative observation versus fine needle aspiration versus surgical excision.  The small possibilities of malignant transformation from benign mixed tumor are also discussed.  3.  The risks, benefits, alternatives and details of the surgical procedure are extensively reviewed.  4.  The patient would like to proceed with the surgical excision procedure.  We will schedule the procedure in accordance with the patient's schedule.

## 2018-06-13 NOTE — Anesthesia Postprocedure Evaluation (Signed)
Anesthesia Post Note  Patient: Jerome Mays  Procedure(s) Performed: RIGHT TOTAL PAROTIDECTOMY (Right Face)     Patient location during evaluation: PACU Anesthesia Type: General Level of consciousness: awake and alert and awake Pain management: pain level controlled Vital Signs Assessment: post-procedure vital signs reviewed and stable Respiratory status: spontaneous breathing, nonlabored ventilation and respiratory function stable Cardiovascular status: blood pressure returned to baseline and stable Postop Assessment: no apparent nausea or vomiting Anesthetic complications: no    Last Vitals:  Vitals:   06/13/18 1215 06/13/18 1230  BP: (!) 142/66 131/69  Pulse: 83 84  Resp: (!) 23 16  Temp:  36.4 C  SpO2: 94% 95%    Last Pain:  Vitals:   06/13/18 1230  TempSrc:   PainSc: 0-No pain                 Catalina Gravel

## 2018-06-13 NOTE — Transfer of Care (Signed)
Immediate Anesthesia Transfer of Care Note  Patient: Jerome Mays  Procedure(s) Performed: RIGHT TOTAL PAROTIDECTOMY (Right Face)  Patient Location: PACU  Anesthesia Type:General  Level of Consciousness: awake, alert  and patient cooperative  Airway & Oxygen Therapy: Patient Spontanous Breathing and Patient connected to face mask oxygen  Post-op Assessment: Report given to RN and Post -op Vital signs reviewed and stable  Post vital signs: Reviewed and stable  Last Vitals:  Vitals Value Taken Time  BP 131/67 06/13/2018 11:42 AM  Temp    Pulse 89 06/13/2018 11:43 AM  Resp 14 06/13/2018 11:43 AM  SpO2 99 % 06/13/2018 11:43 AM  Vitals shown include unvalidated device data.  Last Pain:  Vitals:   06/13/18 0805  TempSrc: Oral  PainSc: 0-No pain         Complications: No apparent anesthesia complications

## 2018-06-13 NOTE — Discharge Instructions (Addendum)
Parotidectomy, Care After Refer to this sheet in the next few weeks. These instructions provide you with information about caring for yourself after your procedure. Your health care provider may also give you more specific instructions. Your treatment has been planned according to current medical practices, but problems sometimes occur. Call your health care provider if you have any problems or questions after your procedure. What can I expect after the procedure? After your procedure, it is common to have:  Pain and mild swelling at the site of your incision.  Numbness along the incision.  Numbness in part or all of your ear.  Mild jaw discomfort on the surgical side when you are eating or chewing. Follow these instructions at home: Medicines  Take over-the-counter and prescription medicines only as told by your health care provider.  If you were prescribed an antibiotic medicine, take it as told by your health care provider. Do not stop taking the antibiotic even if you start to feel better.  Do not drive or use heavy machinery while taking prescription pain medicine. Incision care   Follow instructions from your health care provider about how to take care of your incision. Make sure you: ? Leave stitches (sutures), skin glue, or adhesive strips in place. These skin closures may need to be in place for 2 weeks or longer. If adhesive strip edges start to loosen and curl up, you may trim the loose edges. Do not remove adhesive strips completely unless your health care provider tells you to do that.  Check your incision area every day for signs of infection. Check for: ? More redness, swelling, or pain. ? More fluid or blood. ? Warmth. ? Pus or a bad smell.  Follow your health care providers instructions about cleaning and maintaining the drain that was placed near your incision. Eating and drinking  Follow instructions from your health care provider about eating or drinking  restrictions.  If your mouth or jaw is sore, try eating soft foods until you feel better.  Drink enough fluid to keep your urine pale yellow. General instructions  Return to your normal activities as told by your health care provider. Ask your health care provider what activities are safe for you.  Take short walks every 1-2 hours during the day. Ask for help if you feel weak or unsteady.  Do not lift anything that is heavier than 10 lb (4.5 kg), or the limit that you are told, until your health care provider says that it is safe.  Keep your head raised (elevated) when you lie down. Try using two pillows to do this. Do this until your incision heals.  Keep all follow-up visits as told by your health care provider. This is important. Contact a health care provider if:  You have pain that does not get better with medicine.  You have more redness, swelling, or pain around your incision.  You have more fluid or blood coming from your incision.  Your incision feels warm to the touch.  You have pus or a bad smell coming from your incision.  You vomit or feel nauseous.  You have a fever.  Your face sweats or turns red when you eat. Get help right away if:  You have pain, swelling, or redness that suddenly gets worse at the incision site.  You have increasing numbness or weakness in your face.  You have severe pain. Summary  After your procedure, it is common to have pain and mild swelling  at the site of your incision.  Wash your hands with soap and water before you change your bandage (dressing).  Do not lift anything that is heavier than 10 lb (4.5 kg), or the limit that you are told, until your health care provider says that it is safe. This information is not intended to replace advice given to you by your health care provider. Make sure you discuss any questions you have with your health care provider. Document Released: 06/27/2010 Document Revised: 02/04/2018 Document  Reviewed: 04/30/2015 Elsevier Interactive Patient Education  2019 [facility] Instructions  Activity: Get plenty of rest for the remainder of the day. A responsible individual must stay with you for 24 hours following the procedure.  For the next 24 hours, DO NOT: -Drive a car -Paediatric nurse -Drink alcoholic beverages -Take any medication unless instructed by your physician -Make any legal decisions or sign important papers.  Meals: Start with liquid foods such as gelatin or soup. Progress to regular foods as tolerated. Avoid greasy, spicy, heavy foods. If nausea and/or vomiting occur, drink only clear liquids until the nausea and/or vomiting subsides. Call your physician if vomiting continues.  Special Instructions/Symptoms: Your throat may feel dry or sore from the anesthesia or the breathing tube placed in your throat during surgery. If this causes discomfort, gargle with warm salt water. The discomfort should disappear within 24 hours.  If you had a scopolamine patch placed behind your ear for the management of post- operative nausea and/or vomiting:  1. The medication in the patch is effective for 72 hours, after which it should be removed.  Wrap patch in a tissue and discard in the trash. Wash hands thoroughly with soap and water. 2. You may remove the patch earlier than 72 hours if you experience unpleasant side effects which may include dry mouth, dizziness or visual disturbances. 3. Avoid touching the patch. Wash your hands with soap and water after contact with the patch.

## 2018-06-13 NOTE — Anesthesia Procedure Notes (Signed)
Procedure Name: Intubation Performed by: Verita Lamb, CRNA Pre-anesthesia Checklist: Patient identified, Emergency Drugs available, Suction available, Patient being monitored and Timeout performed Patient Re-evaluated:Patient Re-evaluated prior to induction Oxygen Delivery Method: Circle system utilized Preoxygenation: Pre-oxygenation with 100% oxygen Induction Type: IV induction Ventilation: Mask ventilation with difficulty Laryngoscope Size: Mac and 4 Grade View: Grade I Tube type: Oral Tube size: 7.0 mm Number of attempts: 1 Airway Equipment and Method: Stylet Placement Confirmation: ETT inserted through vocal cords under direct vision,  positive ETCO2,  CO2 detector and breath sounds checked- equal and bilateral Secured at: 24 cm Tube secured with: Tape Dental Injury: Teeth and Oropharynx as per pre-operative assessment

## 2018-06-14 DIAGNOSIS — D110 Benign neoplasm of parotid gland: Secondary | ICD-10-CM | POA: Diagnosis not present

## 2018-06-14 DIAGNOSIS — Z7982 Long term (current) use of aspirin: Secondary | ICD-10-CM | POA: Diagnosis not present

## 2018-06-14 DIAGNOSIS — Z87891 Personal history of nicotine dependence: Secondary | ICD-10-CM | POA: Diagnosis not present

## 2018-06-14 DIAGNOSIS — Z888 Allergy status to other drugs, medicaments and biological substances status: Secondary | ICD-10-CM | POA: Diagnosis not present

## 2018-06-14 DIAGNOSIS — Z79899 Other long term (current) drug therapy: Secondary | ICD-10-CM | POA: Diagnosis not present

## 2018-06-14 DIAGNOSIS — J449 Chronic obstructive pulmonary disease, unspecified: Secondary | ICD-10-CM | POA: Diagnosis not present

## 2018-06-14 NOTE — Discharge Summary (Signed)
Physician Discharge Summary  Patient ID: Jerome Mays MRN: [id number] DOB/AGE: [birth year] 78 y.o.  Admit date: 06/13/2018 Discharge date: 06/14/2018  Admission Diagnoses: Right parotid mass  Discharge Diagnoses: Right parotid mass Active Problems:   H/O parotidectomy   Discharged Condition: good  Hospital Course: Pt had an uneventful overnight stay. Pt tolerated po well. No bleeding. No stridor. Facial nerve intact.  Consults: None  Significant Diagnostic Studies: None  Treatments: surgery: Right total parotidectomy  Discharge Exam: Blood pressure 132/71, pulse 62, temperature 97.9 F (36.6 C), resp. rate 16, height 6\' 1"  (1.854 m), weight 87.5 kg, SpO2 95 %. Incision/Wound:c/d/i CN 7 intact bilaterally.  Disposition: Discharge disposition: 01-Home or Self Care       Discharge Instructions    Activity as tolerated - No restrictions   Complete by:  As directed    Diet general   Complete by:  As directed      Allergies as of 06/14/2018      Reactions   Lipitor [atorvastatin] Rash, Other (See Comments)   Passed out      Medication List    STOP taking these medications   aspirin 81 MG tablet     TAKE these medications   allopurinol 300 MG tablet Commonly known as:  ZYLOPRIM Take 300 mg by mouth daily.   amoxicillin 875 MG tablet Commonly known as:  AMOXIL Take 1 tablet (875 mg total) by mouth 2 (two) times daily for 5 days.   diphenhydramine-acetaminophen 25-500 MG Tabs tablet Commonly known as:  TYLENOL PM Take 2 tablets by mouth at bedtime as needed (sleep).   ketorolac 0.5 % ophthalmic solution Commonly known as:  ACULAR Place 1 drop into the left eye 4 (four) times daily.   ONE-A-DAY MENS PO Take 1 tablet by mouth daily.   oxyCODONE-acetaminophen 5-325 MG tablet Commonly known as:  PERCOCET Take 1-2 tablets by mouth every 4 (four) hours as needed for severe pain.   prednisoLONE acetate 1 % ophthalmic suspension Commonly known as:  PRED  FORTE Place 1 drop into the left eye 4 (four) times daily.   SYSTANE OP Place 1 drop into both eyes as needed (for dry eyes).   TRELEGY ELLIPTA 100-62.5-25 MCG/INH Aepb Generic drug:  Fluticasone-Umeclidin-Vilant INHALE 1 PUFF ONCE DAILY      Follow-up Information    Leta Baptist, MD On 06/20/2018.   Specialty:  Otolaryngology Why:  at [employer] information: [place] [address] [phone]           Signed: Burley Saver 06/14/2018, 7:26 AM

## 2018-06-15 ENCOUNTER — Encounter (HOSPITAL_BASED_OUTPATIENT_CLINIC_OR_DEPARTMENT_OTHER): Payer: Self-pay | Admitting: Otolaryngology

## 2018-06-20 ENCOUNTER — Ambulatory Visit (INDEPENDENT_AMBULATORY_CARE_PROVIDER_SITE_OTHER): Payer: Medicare HMO | Admitting: Otolaryngology

## 2018-06-21 ENCOUNTER — Telehealth (INDEPENDENT_AMBULATORY_CARE_PROVIDER_SITE_OTHER): Payer: Self-pay

## 2018-06-29 DIAGNOSIS — M75121 Complete rotator cuff tear or rupture of right shoulder, not specified as traumatic: Secondary | ICD-10-CM | POA: Diagnosis not present

## 2018-06-29 DIAGNOSIS — Y999 Unspecified external cause status: Secondary | ICD-10-CM | POA: Diagnosis not present

## 2018-06-29 DIAGNOSIS — G8918 Other acute postprocedural pain: Secondary | ICD-10-CM | POA: Diagnosis not present

## 2018-06-29 DIAGNOSIS — M7541 Impingement syndrome of right shoulder: Secondary | ICD-10-CM | POA: Diagnosis not present

## 2018-06-29 DIAGNOSIS — S46011A Strain of muscle(s) and tendon(s) of the rotator cuff of right shoulder, initial encounter: Secondary | ICD-10-CM | POA: Diagnosis not present

## 2018-07-05 ENCOUNTER — Other Ambulatory Visit: Payer: Self-pay | Admitting: Pulmonary Disease

## 2018-07-05 ENCOUNTER — Ambulatory Visit (HOSPITAL_COMMUNITY)
Admission: RE | Admit: 2018-07-05 | Discharge: 2018-07-05 | Disposition: A | Discharge status: Home / Self Care | Payer: Medicare HMO | Source: Ambulatory Visit | Attending: Pulmonary Disease | Admitting: Pulmonary Disease

## 2018-07-05 DIAGNOSIS — R911 Solitary pulmonary nodule: Secondary | ICD-10-CM | POA: Diagnosis not present

## 2018-07-05 DIAGNOSIS — R918 Other nonspecific abnormal finding of lung field: Secondary | ICD-10-CM | POA: Diagnosis not present

## 2018-07-11 DIAGNOSIS — L570 Actinic keratosis: Secondary | ICD-10-CM | POA: Diagnosis not present

## 2018-07-22 ENCOUNTER — Other Ambulatory Visit: Payer: Self-pay | Admitting: Pulmonary Disease

## 2018-07-22 DIAGNOSIS — R918 Other nonspecific abnormal finding of lung field: Secondary | ICD-10-CM

## 2018-07-25 DIAGNOSIS — L821 Other seborrheic keratosis: Secondary | ICD-10-CM | POA: Diagnosis not present

## 2018-07-25 DIAGNOSIS — L280 Lichen simplex chronicus: Secondary | ICD-10-CM | POA: Diagnosis not present

## 2018-07-25 DIAGNOSIS — L570 Actinic keratosis: Secondary | ICD-10-CM | POA: Diagnosis not present

## 2018-07-29 ENCOUNTER — Telehealth (HOSPITAL_COMMUNITY): Payer: Self-pay | Admitting: Internal Medicine

## 2018-07-29 NOTE — Telephone Encounter (Signed)
07/29/18  Called and left a message to let patient know what his copay was.

## 2018-08-01 ENCOUNTER — Encounter (HOSPITAL_COMMUNITY): Payer: Self-pay | Admitting: Specialist

## 2018-08-01 ENCOUNTER — Ambulatory Visit (HOSPITAL_COMMUNITY): Payer: Medicare HMO | Attending: Orthopedic Surgery | Admitting: Specialist

## 2018-08-01 DIAGNOSIS — R29898 Other symptoms and signs involving the musculoskeletal system: Secondary | ICD-10-CM | POA: Diagnosis not present

## 2018-08-01 DIAGNOSIS — M25611 Stiffness of right shoulder, not elsewhere classified: Secondary | ICD-10-CM

## 2018-08-01 NOTE — Patient Instructions (Signed)
SHOULDER: Flexion On Table   Place hands on towel placed on table, elbows straight. Lean forward with you upper body, pushing towel away from body. Hold ___ seconds. ___ reps per set, ___ sets per day  Abduction (Passive)   With arm out to side, resting on towel placed on table, keeping trunk away from table, lean to the side while pushing towel away from body. Hold ____ seconds. Repeat ____ times. Do ____ sessions per day.  Copyright  VHI. All rights reserved.     Internal Rotation (Assistive)   Seated with elbow bent at right angle and held against side, slide arm on table surface in an inward arc keeping elbow anchored in place. Repeat ____ times. Do ____ sessions per day. Activity: Use this motion to brush crumbs off the table.  Copyright  VHI. All rights reserved.    

## 2018-08-02 NOTE — Therapy (Signed)
[place], [place], [address] Phone: [phone]   Fax:  [phone]  Occupational Therapy Evaluation  Patient Details  Name: Jerome Mays MRN: [id number] Date of Birth: 1941/01/22 Referring Provider (OT): Dr. Gladstone Lighter   Encounter Date: 08/01/2018  OT End of Session - 08/02/18 1158    Visit Number  1    Number of Visits  16    Date for OT Re-Evaluation  09/26/18   mini reassess on 08/29/18   Authorization Type  [employer] HMO    Authorization Time Period  no limitations - visits based on moedical necessity    OT Start Time  1300    OT Stop Time  1345    OT Time Calculation (min)  45 min    Activity Tolerance  Patient tolerated treatment well    Behavior During Therapy  [facility] for tasks assessed/performed       Past Medical History:  Diagnosis Date  . Anemia    low iron  . Arthritis   . Colonic polyp   . Constipation   . COPD (chronic obstructive pulmonary disease) ([place])   . GERD (gastroesophageal reflux disease)   . Gout   . History of kidney stones   . Malignant tumor of kidney ([place])   . Pain    LOWER BACK AND LEFT HIP - HX OF PREVIOUS LUMBAR AND CERVICAL SURGERY--PT STATES HE HAS HAD NUMBNESS IN BOTH FEET SINCE HIS BACK SURGERY  . Renal calculus   . Retinal detachment     Past Surgical History:  Procedure Laterality Date  . BACK SURGERY     LUMBAR SURGERY  . CATARACT EXTRACTION Bilateral 2003   Surgeon unknown  . EYE SURGERY     BILATERAL CATARACT EXTRACTIONS  . GIVENS CAPSULE STUDY N/A 10/07/2015   Procedure: GIVENS CAPSULE STUDY;  Surgeon: Carol Ada, MD;  Location: [facility];  Service: Endoscopy;  Laterality: N/A;  . HAND SURGERY     left  . KNEE SURGERY     right  . LAMINECTOMY     cervical  . LASER PHOTO ABLATION Left 08/19/2017   Procedure: LASER PHOTO ABLATION;  Surgeon: [name], MD;  Location: [facility];  Service: Ophthalmology;  Laterality: Left;  . MEMBRANE PEEL Left  03/22/2017   Procedure: POSSIBLE MEMBRANE PEEL WITH SILICONE OIL AND PERFLURON;  Surgeon: [name], MD;  Location: [facility];  Service: Ophthalmology;  Laterality: Left;  . NEPHRECTOMY     PT STATES RIGHT KIDNEY WAS REMOVED  . PAROTIDECTOMY Right 06/13/2018   Procedure: RIGHT TOTAL PAROTIDECTOMY;  Surgeon: Leta Baptist, MD;  Location: [facility];  Service: ENT;  Laterality: Right;  . PARS PLANA VITRECTOMY Left 03/22/2017   Procedure: PARS PLANA VITRECTOMY WITH 25 GAUGE;  Surgeon: [name], MD;  Location: [facility];  Service: Ophthalmology;  Laterality: Left;  . PARS PLANA VITRECTOMY W/ SCLERAL BUCKLE Left 08/19/2017   Procedure: 29 GAUGE PARS PLANA VITRECTOMY WITH SILCONE OIL REMOVAL;  Surgeon: [name], MD;  Location: [facility];  Service: Ophthalmology;  Laterality: Left;  . 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 TOTAL KNEE ARTHROPLASTY Left 10/19/2012   Procedure: LEFT TOTAL KNEE ARTHROPLASTY;  Surgeon: Magnus Sinning, MD;  Location: [facility];  Service: Orthopedics;  Laterality: Left;  [name] VITRECTOMY 25 GAUGE WITH SCLERAL BUCKLE Left 02/17/2017   Procedure: VITRECTOMY 25 GAUGE WITH SCLERAL BUCKLE membrane peel, injection of silicone oil, and endolaser photocoagulation.;  Surgeon: [name],  MD;  Location: [facility];  Service: Ophthalmology;  Laterality: Left;    There were no vitals filed for this visit.  Subjective Assessment - 08/02/18 1140    Subjective   S:  My arm was bothering me for awhile.      Pertinent History  [name] reports experiencing pain and limited mobility in his right shoulder.  He consulted with Dr. Gladstone Lighter and received a cortisone injection, which alleviated his pain for approximately 5 months.  He received an additional injection, which did not alleviate his pain.  He had an open right rotator cuff repair surgery approximately one month ago, and was released from wearing his sling last week.  He has been referred to occupational therapy for evaluation and  treatment.      Special Tests  FOTO 57% independent    Patient Stated Goals  I want to get back to at least 90% use of my right arm.     Currently in Pain?  No/denies   patient reports no pain s/p surgery       [facility] OT Assessment - 08/02/18 0001      Assessment   Medical Diagnosis  S/P Right Rotator Cuff Repair    Referring Provider (OT)  Dr. Gladstone Lighter    Onset Date/Surgical Date  07/04/18    Hand Dominance  Right    Next MD Visit  unknown    Prior Therapy  none      Precautions   Precautions  Shoulder    Type of Shoulder Precautions  standard protocol:  gentle gradual ROM progression       Restrictions   Weight Bearing Restrictions  No      Balance Screen   Has the patient fallen in the past 6 months  No    Has the patient had a decrease in activity level because of a fear of falling?   No    Is the patient reluctant to leave their home because of a fear of falling?   No      Home  Environment   Family/patient expects to be discharged to:  Private residence    Lives With  [place]  Retired    Leisure  patient enjoys woodworking, metal working, golfing      ADL   ADL comments  patient is unable to use his dominant right arm for any functional activty above waist height due to decreased range of motion and strength.  He is unable to lift anything above waist height, he is unable to swing a hammer in his shop.        Written Expression   Dominant Hand  Right      Vision - History   Baseline Vision  No visual deficits      Cognition   Overall Cognitive Status  Within Functional Limits for tasks assessed      Observation/Other Assessments   Focus on Therapeutic Outcomes (FOTO)   57% independent      Sensation   Light Touch  Appears Intact      ROM / Strength   AROM / PROM / Strength  AROM;PROM;Strength      Palpation   Palpation comment  moderate fascial restrictions and scar restrictions in  right shoulder, scapular, and upper arm region      AROM   Overall AROM Comments  assessed in supine, external and internal  rotation with shoulder adducted    AROM Assessment Site  Shoulder    Right/Left Shoulder  Right    Right Shoulder Flexion  50 Degrees    Right Shoulder ABduction  45 Degrees    Right Shoulder Internal Rotation  90 Degrees    Right Shoulder External Rotation  40 Degrees      PROM   Overall PROM Comments  assessed in supine external and internal rotation with shoulder adducted    PROM Assessment Site  Shoulder    Right/Left Shoulder  Right    Right Shoulder Flexion  95 Degrees    Right Shoulder ABduction  105 Degrees    Right Shoulder Internal Rotation  90 Degrees    Right Shoulder External Rotation  70 Degrees      Strength   Overall Strength Comments  not assessed due to recent surgery    Strength Assessment Site  Shoulder    Right/Left Shoulder  Right               OT Treatments/Exercises (OP) - 08/02/18 0001      Manual Therapy   Manual Therapy  Myofascial release    Manual therapy comments  manual therapy interventions completed seperately from all other interventions this date of service     Myofascial Release  myofascial release and manual stretching to right upper arm, scapular, and shoulder region, as well as scar release to right surgical scar, to decrease fascial restrictions and tightness             OT Education - 08/02/18 1157    Education Details  educated patient on plan of care and towel slides to complete each exercise 3-5 minutes per day, 3-5 times per day.    Person(s) Educated  Patient    Methods  Explanation;Demonstration;Handout    Comprehension  Verbalized understanding;Returned demonstration       OT Short Term Goals - 08/02/18 1205      OT SHORT TERM GOAL #1   Title  Patient will be educated on HEP for improved right shoulder mobility and strength needed for ADL and leisure activity completion.      Time  4     Period  Weeks    Status  New    Target Date  08/29/18      OT SHORT TERM GOAL #2   Title  Patient will improve right shoulder P/ROM to [facility] in order to don and doff shirts with less difficulty.    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    Time  4    Period  Weeks    Status  New      OT SHORT TERM GOAL #4   Title  Patient will decrease fascial restrictions from moderate to min-mod in order to have greater functional use of his right arm with daily tasks.    Time  4    Period  Weeks    Status  New        OT Long Term Goals - 08/02/18 1207      OT LONG TERM GOAL #1   Title  Patient will use his right arm as dominant with all B/IADLS and leisure activities.     Time  8  Period  Weeks    Status  New    Target Date  09/26/18      OT LONG TERM GOAL #2   Title  Patient will improve his right shoulder A/ROM to [facility] in order to reach overhead to retrieve items in his wood shop and swing a golf club.     Time  8    Period  Weeks    Status  New      OT LONG TERM GOAL #3   Title  Patient will increase his right shoulder strength to 5/5 for improved ability to lift bags of yard waste.     Time  8    Period  Weeks    Status  New      OT LONG TERM GOAL #4   Title  Patient will decrease fascial restrictions to trace in his right shoulder for greater mobility needed for ADL completion.      Time  8    Period  Weeks    Status  New            Plan - 08/02/18 1200    Clinical Impression Statement  A:  Patient is a 78 year old male with past medical history signifcant for back surgery, kidney removal, bilateral knee replacements, and hand surgery.  Patient was experiencing progressively worsening pain and limited mobility in his right shoulder for approximately 9 months.  He had a right open rotator cuff repair completed one month ago, and  was released from wearing his sling last week.  He presents with decreased ability to use his dominant right arm s/p surgery with all desired B/IADLs and leisure activities.  Patient would benefit from skilled OT intervention to decrease fascial restrictions and improve A/ROM, P/ROM, and strength to WNL in order to return to prior level of independence with all desired activities.     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, golfs    Occupational performance deficits (Please refer to evaluation for details):  ADL's;IADL's;Leisure    Rehab Potential  Good    OT Frequency  2x / week    OT Duration  8 weeks    OT Treatment/Interventions  Self-care/ADL training;Cryotherapy;Therapeutic exercise;Scar mobilization;Manual Therapy;Neuromuscular education;Ultrasound;Electrical Stimulation;Moist Heat;Energy conservation;Passive range of motion;Therapeutic activities;Patient/family education    Plan  P: Skilled OT intervention 2 times a week for 8 weeks to decrease restrictions and scar tissue while improving A/ROM, P/ROM, strength in order to return to prior level of independence with all B/IADLs and leisure tasks.      Clinical Decision Making  Limited treatment options, no task modification necessary    OT Home Exercise Plan  08/01/18: towel slides     Consulted and Agree with Plan of Care  Patient       Patient will benefit from skilled therapeutic intervention in order to improve the following deficits and impairments:  Increased muscle spasms, Pain, Decreased range of motion, Decreased strength, Increased fascial restrictions, Impaired UE functional use  Visit Diagnosis: Other symptoms and signs involving the musculoskeletal system - Plan: Ot plan of care cert/re-cert  Stiffness of right shoulder, not elsewhere classified - Plan: Ot plan of care cert/re-cert    Problem List Patient Active Problem List   Diagnosis Date  Noted  . H/O parotidectomy 06/13/2018  . COPD, group B, by GOLD 2017 classification ([place]) 02/02/2017  . Hemoptysis 06/18/2016  . Stiffness of left knee 11/08/2012  . Difficulty  in walking(719.7) 11/08/2012  . Gout 03/04/2012  . ENLARGEMENT OF LYMPH NODES 08/21/2009  . CLOS FRACTURE MID/PROXIMAL PHALANX/PHALANG HAND 07/31/2009  . 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 COPD with chronic bronchitis ([place]) 12/06/2007  . COLONIC POLYPS, HX OF 12/05/2007  . RENAL CALCULUS, HX OF 12/05/2007    Vangie Bicker, MHA, OTR/L [phone]  08/02/2018, 1:25 PM  [facility] [address] [place], [place], [address] Phone: [phone]   Fax:  [phone]  Name: Jerome Mays MRN: [id number] Date of Birth: Oct 26, 1940

## 2018-08-03 DIAGNOSIS — D509 Iron deficiency anemia, unspecified: Secondary | ICD-10-CM | POA: Diagnosis not present

## 2018-08-03 DIAGNOSIS — M109 Gout, unspecified: Secondary | ICD-10-CM | POA: Diagnosis not present

## 2018-08-03 DIAGNOSIS — Z79899 Other long term (current) drug therapy: Secondary | ICD-10-CM | POA: Diagnosis not present

## 2018-08-03 DIAGNOSIS — Z125 Encounter for screening for malignant neoplasm of prostate: Secondary | ICD-10-CM | POA: Diagnosis not present

## 2018-08-03 DIAGNOSIS — R82998 Other abnormal findings in urine: Secondary | ICD-10-CM | POA: Diagnosis not present

## 2018-08-03 DIAGNOSIS — R7301 Impaired fasting glucose: Secondary | ICD-10-CM | POA: Diagnosis not present

## 2018-08-04 ENCOUNTER — Encounter (HOSPITAL_COMMUNITY): Payer: Self-pay

## 2018-08-04 ENCOUNTER — Ambulatory Visit (HOSPITAL_COMMUNITY): Payer: Medicare HMO

## 2018-08-04 DIAGNOSIS — M25611 Stiffness of right shoulder, not elsewhere classified: Secondary | ICD-10-CM | POA: Diagnosis not present

## 2018-08-04 DIAGNOSIS — R29898 Other symptoms and signs involving the musculoskeletal system: Secondary | ICD-10-CM

## 2018-08-04 NOTE — Patient Instructions (Signed)
SCAPULAR RETRACTION [place]  Tuck chin back as you pinch shoulder blades together.  Hold __5_ seconds.  Complete 10 times.

## 2018-08-04 NOTE — Therapy (Signed)
[place], [place], [address] Phone: [phone]   Fax:  [phone]  Occupational Therapy Treatment  Patient Details  Name: LENNELL SHANKS MRN: [id number] Date of Birth: 19-Jan-1941 Referring Provider (OT): Dr. Gladstone Lighter   Encounter Date: 08/04/2018  OT End of Session - 08/04/18 1241    Visit Number  2    Number of Visits  16    Date for OT Re-Evaluation  09/26/18   mini reassess on 08/29/18   Authorization Type  [employer] Medicare HMO    Authorization Time Period  no limitations - visits based on medical necessity    OT Start Time  0805    OT Stop Time  0845    OT Time Calculation (min)  40 min    Activity Tolerance  Patient tolerated treatment well    Behavior During Therapy  [facility] for tasks assessed/performed       Past Medical History:  Diagnosis Date  . Anemia    low iron  . Arthritis   . Colonic polyp   . Constipation   . COPD (chronic obstructive pulmonary disease) ([place])   . GERD (gastroesophageal reflux disease)   . Gout   . History of kidney stones   . Malignant tumor of kidney ([place])   . Pain    LOWER BACK AND LEFT HIP - HX OF PREVIOUS LUMBAR AND CERVICAL SURGERY--PT STATES HE HAS HAD NUMBNESS IN BOTH FEET SINCE HIS BACK SURGERY  . Renal calculus   . Retinal detachment     Past Surgical History:  Procedure Laterality Date  . BACK SURGERY     LUMBAR SURGERY  . CATARACT EXTRACTION Bilateral 2003   Surgeon unknown  . EYE SURGERY     BILATERAL CATARACT EXTRACTIONS  . GIVENS CAPSULE STUDY N/A 10/07/2015   Procedure: GIVENS CAPSULE STUDY;  Surgeon: Carol Ada, MD;  Location: [place];  Service: Endoscopy;  Laterality: N/A;  . HAND SURGERY     left  . KNEE SURGERY     right  . LAMINECTOMY     cervical  . LASER PHOTO ABLATION Left 08/19/2017   Procedure: LASER PHOTO ABLATION;  Surgeon: [name], MD;  Location: [place];  Service: Ophthalmology;  Laterality: Left;  . MEMBRANE PEEL Left  03/22/2017   Procedure: POSSIBLE MEMBRANE PEEL WITH SILICONE OIL AND PERFLURON;  Surgeon: [name], MD;  Location: [place];  Service: Ophthalmology;  Laterality: Left;  . NEPHRECTOMY     PT STATES RIGHT KIDNEY WAS REMOVED  . PAROTIDECTOMY Right 06/13/2018   Procedure: RIGHT TOTAL PAROTIDECTOMY;  Surgeon: Leta Baptist, MD;  Location: [place];  Service: ENT;  Laterality: Right;  . PARS PLANA VITRECTOMY Left 03/22/2017   Procedure: PARS PLANA VITRECTOMY WITH 25 GAUGE;  Surgeon: [name], MD;  Location: [place] [facility];  Service: Ophthalmology;  Laterality: Left;  . PARS PLANA VITRECTOMY W/ SCLERAL BUCKLE Left 08/19/2017   Procedure: 45 GAUGE PARS PLANA VITRECTOMY WITH SILCONE OIL REMOVAL;  Surgeon: [name], MD;  Location: [place];  Service: Ophthalmology;  Laterality: Left;  . 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 TOTAL KNEE ARTHROPLASTY Left 10/19/2012   Procedure: LEFT TOTAL KNEE ARTHROPLASTY;  Surgeon: Magnus Sinning, MD;  [facility];  Service: Orthopedics;  Laterality: Left;  [name] VITRECTOMY 25 GAUGE WITH SCLERAL BUCKLE Left 02/17/2017   Procedure: VITRECTOMY 25 GAUGE WITH SCLERAL BUCKLE membrane peel, injection of silicone oil, and endolaser photocoagulation.;  Surgeon: [name],  MD;  Location: [place];  Service: Ophthalmology;  Laterality: Left;    There were no vitals filed for this visit.  Subjective Assessment - 08/04/18 1154    Subjective   S: I just can't lift my arm very high.    Currently in Pain?  Yes    Pain Score  4     Pain Location  Shoulder    Pain Orientation  Right    Pain Descriptors / Indicators  Aching;Sore         [facility] OT Assessment - 08/04/18 0821      Assessment   Medical Diagnosis  S/P Right Rotator Cuff Repair      Precautions   Precautions  Shoulder    Type of Shoulder Precautions  standard protocol:  gentle gradual ROM progression                OT Treatments/Exercises (OP) - 08/04/18 0821      Exercises   Exercises   Shoulder      Shoulder Exercises: Supine   Protraction  PROM;10 reps    Horizontal ABduction  PROM;10 reps    External Rotation  PROM;10 reps    Internal Rotation  PROM;10 reps    Flexion  PROM;10 reps    ABduction  PROM;10 reps      Shoulder Exercises: Seated   Extension  AROM;10 reps    Row  AROM;10 reps    Other Seated Exercises  Scapular depression; 10X; A/ROM      Shoulder Exercises: Therapy Ball   Flexion  10 reps    ABduction  10 reps      Shoulder Exercises: ROM/Strengthening   Thumb Tacks  1' low level    Prot/Ret//Elev/Dep  1'      Shoulder Exercises: Isometric Strengthening   Flexion  Supine;5X5"    Extension  Supine;5X5"    External Rotation  Supine;5X5"    Internal Rotation  Supine;5X5"    ABduction  Supine;5X5"    ADduction  Supine;5X5"      Manual Therapy   Manual Therapy  Myofascial release    Manual therapy comments  manual therapy interventions completed seperately from all other interventions this date of service     Myofascial Release  myofascial release and manual stretching to right upper arm, scapular, and shoulder region, as well as scar release to right surgical scar, to decrease fascial restrictions and tightness                OT Short Term Goals - 08/04/18 0827      OT SHORT TERM GOAL #1   Title  Patient will be educated on HEP for improved right shoulder mobility and strength needed for ADL and leisure activity completion.      Time  4    Period  Weeks    Status  On-going    Target Date  08/29/18      OT SHORT TERM GOAL #2   Title  Patient will improve right shoulder P/ROM to [employer] in order to don and doff shirts with less difficulty.    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    Time  4    Period  Weeks    Status  On-going  OT SHORT TERM GOAL #4   Title  Patient will decrease  fascial restrictions from moderate to min-mod in order to have greater functional use of his right arm with daily tasks.    Time  4    Period  Weeks    Status  On-going        OT Long Term Goals - 08/04/18 0827      OT LONG TERM GOAL #1   Title  Patient will use his right arm as dominant with all B/IADLS and leisure activities.     Time  8    Period  Weeks    Status  On-going      OT LONG TERM GOAL #2   Title  Patient will improve his right shoulder A/ROM to [facility] in order to reach overhead to retrieve items in his wood shop and swing a golf club.     Time  8    Period  Weeks    Status  On-going      OT LONG TERM GOAL #3   Title  Patient will increase his right shoulder strength to 5/5 for improved ability to lift bags of yard waste.     Time  8    Period  Weeks    Status  On-going      OT LONG TERM GOAL #4   Title  Patient will decrease fascial restrictions to trace in his right shoulder for greater mobility needed for ADL completion.      Time  8    Period  Weeks    Status  On-going            Plan - 08/04/18 1242    Clinical Impression Statement  A: Initiated myofascial release, manual stretching, isometric strengthening, and therapy ball stretches. Patient with limited passive ROM although able to achieve approximately 50% of range during flexion and abduction. Patient with limited scapular mobility and had max difficulty with pro/ret/elev/dep requiring max verbal and physical cueing. Added scapular retraction to HEP to increase indpendence and ability to perform task for next session.     Plan  P: Follow up on scapular/shoulder blade squeezes provided at last session. Increase independence with scapular mobility during pro/ret/elev/dep. Work on increasing passive ROM of RUE within patient's pain tolerance.     Consulted and Agree with Plan of Care  Patient       Patient will benefit from skilled therapeutic intervention in order to improve the following deficits and  impairments:  Increased muscle spasms, Pain, Decreased range of motion, Decreased strength, Increased fascial restrictions, Impaired UE functional use  Visit Diagnosis: Other symptoms and signs involving the musculoskeletal system  Stiffness of right shoulder, not elsewhere classified    Problem List Patient Active Problem List   Diagnosis Date Noted  . H/O parotidectomy 06/13/2018  . COPD, group B, by GOLD 2017 classification ([place]) 02/02/2017  . Hemoptysis 06/18/2016  . Stiffness of left knee 11/08/2012  . Difficulty in walking(719.7) 11/08/2012  . Gout 03/04/2012  . ENLARGEMENT OF LYMPH NODES 08/21/2009  . CLOS FRACTURE MID/PROXIMAL PHALANX/PHALANG HAND 07/31/2009  . 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 COPD with chronic bronchitis ([place]) 12/06/2007  . COLONIC POLYPS, HX OF 12/05/2007  . RENAL CALCULUS, HX OF 12/05/2007   Ailene Ravel, OTR/L,CBIS  [phone]  08/04/2018, 12:45 PM  [facility] [address] [place], [place], [address] Phone: [phone]   Fax:  [phone]  Name: SAMIR ISHAQ MRN: [id number]  Date of Birth: [birth year]

## 2018-08-08 ENCOUNTER — Encounter (HOSPITAL_COMMUNITY): Payer: Self-pay | Admitting: Specialist

## 2018-08-08 ENCOUNTER — Ambulatory Visit (HOSPITAL_COMMUNITY): Payer: Medicare HMO | Attending: Internal Medicine | Admitting: Specialist

## 2018-08-08 DIAGNOSIS — R29898 Other symptoms and signs involving the musculoskeletal system: Secondary | ICD-10-CM | POA: Diagnosis not present

## 2018-08-08 DIAGNOSIS — M25611 Stiffness of right shoulder, not elsewhere classified: Secondary | ICD-10-CM | POA: Diagnosis not present

## 2018-08-08 NOTE — Therapy (Addendum)
[place], [place], [address] Phone: [phone]   Fax:  [phone]  Occupational Therapy Treatment  Patient Details  Name: Jerome Mays MRN: [id number] Date of Birth: 12/28/1940 Referring Provider (OT): Dr. Gladstone Lighter   Encounter Date: 08/08/2018  OT End of Session - 08/08/18 1133    Visit Number  3    Number of Visits  16    Date for OT Re-Evaluation  09/26/18   mini reassess on 08/29/18   Authorization Type  [employer] Medicare HMO    Authorization Time Period  no limitations - visits based on medical necessity    OT Start Time  [id number]    OT Stop Time  1024    OT Time Calculation (min)  37 min    Activity Tolerance  Patient tolerated treatment well    Behavior During Therapy  [facility] for tasks assessed/performed       Past Medical History:  Diagnosis Date  . Anemia    low iron  . Arthritis   . Colonic polyp   . Constipation   . COPD (chronic obstructive pulmonary disease) ([place])   . GERD (gastroesophageal reflux disease)   . Gout   . History of kidney stones   . Malignant tumor of kidney ([place])   . Pain    LOWER BACK AND LEFT HIP - HX OF PREVIOUS LUMBAR AND CERVICAL SURGERY--PT STATES HE HAS HAD NUMBNESS IN BOTH FEET SINCE HIS BACK SURGERY  . Renal calculus   . Retinal detachment     Past Surgical History:  Procedure Laterality Date  . BACK SURGERY     LUMBAR SURGERY  . CATARACT EXTRACTION Bilateral 2003   Surgeon unknown  . EYE SURGERY     BILATERAL CATARACT EXTRACTIONS  . GIVENS CAPSULE STUDY N/A 10/07/2015   Procedure: GIVENS CAPSULE STUDY;  Surgeon: Carol Ada, MD;  Location: [place];  Service: Endoscopy;  Laterality: N/A;  . HAND SURGERY     left  . KNEE SURGERY     right  . LAMINECTOMY     cervical  . LASER PHOTO ABLATION Left 08/19/2017   Procedure: LASER PHOTO ABLATION;  Surgeon: [name], MD;  Location: [place];  Service: Ophthalmology;  Laterality: Left;  . MEMBRANE PEEL Left  03/22/2017   Procedure: POSSIBLE MEMBRANE PEEL WITH SILICONE OIL AND PERFLURON;  Surgeon: [name], MD;  Location: [place];  Service: Ophthalmology;  Laterality: Left;  . NEPHRECTOMY     PT STATES RIGHT KIDNEY WAS REMOVED  . PAROTIDECTOMY Right 06/13/2018   Procedure: RIGHT TOTAL PAROTIDECTOMY;  Surgeon: Leta Baptist, MD;  Location: [place];  Service: ENT;  Laterality: Right;  . PARS PLANA VITRECTOMY Left 03/22/2017   Procedure: PARS PLANA VITRECTOMY WITH 25 GAUGE;  Surgeon: [name], MD;  Location: [place];  Service: Ophthalmology;  Laterality: Left;  . PARS PLANA VITRECTOMY W/ SCLERAL BUCKLE Left 08/19/2017   Procedure: 77 GAUGE PARS PLANA VITRECTOMY WITH SILCONE OIL REMOVAL;  Surgeon: [name], MD;  Location: [facility];  Service: Ophthalmology;  Laterality: Left;  . 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 TOTAL KNEE ARTHROPLASTY Left 10/19/2012   Procedure: LEFT TOTAL KNEE ARTHROPLASTY;  Surgeon: Magnus Sinning, MD;  [facility];  Service: Orthopedics;  Laterality: Left;  [name] VITRECTOMY 25 GAUGE WITH SCLERAL BUCKLE Left 02/17/2017   Procedure: VITRECTOMY 25 GAUGE WITH SCLERAL BUCKLE membrane peel, injection of silicone oil, and endolaser photocoagulation.;  Surgeon: [name],  MD;  Location: [place];  Service: Ophthalmology;  Laterality: Left;    There were no vitals filed for this visit.  Subjective Assessment - 08/08/18 1132    Subjective   S:  Sometimes my arm just gives out, like when I am trying to tuck my shirt in or reach for my wallet    Currently in Pain?  Yes    Pain Score  4     Pain Location  Shoulder    Pain Orientation  Right    Pain Descriptors / Indicators  Aching         [facility] OT Assessment - 08/08/18 0001      Assessment   Medical Diagnosis  S/P Right Rotator Cuff Repair      Precautions   Precautions  Shoulder    Type of Shoulder Precautions  standard protocol:  gentle gradual ROM progression                OT  Treatments/Exercises (OP) - 08/08/18 0001      Exercises   Exercises  Shoulder      Shoulder Exercises: Supine   Protraction  PROM;10 reps;AAROM;5 reps    Horizontal ABduction  PROM;10 reps    External Rotation  PROM;AAROM;10 reps    External Rotation Limitations  therapist provided manual cuing to maintain adduction    Internal Rotation  PROM;AAROM;10 reps    Flexion  PROM;10 reps;AAROM;5 reps    ABduction  PROM;10 reps      Shoulder Exercises: Seated   Elevation  12 reps;AROM    Extension  AROM;12 reps    Row  AROM;12 reps      Shoulder Exercises: Pulleys   Flexion  1 minute    ABduction  1 minute      Shoulder Exercises: Therapy Ball   Flexion  15 reps    ABduction  15 reps      Shoulder Exercises: Isometric Strengthening   Flexion Limitations  resume all isometrics next session      Manual Therapy   Manual Therapy  Myofascial release    Manual therapy comments  manual therapy interventions completed seperately from all other interventions this date of service     Myofascial Release  myofascial release and manual stretching to right upper arm, scapular, and shoulder region, as well as scar release to right surgical scar, to decrease fascial restrictions and tightness                OT Short Term Goals - 08/08/18 1136      OT SHORT TERM GOAL #1   Title  Patient will be educated on HEP for improved right shoulder mobility and strength needed for ADL and leisure activity completion.      Time  4    Period  Weeks    Status  On-going    Target Date  08/29/18      OT SHORT TERM GOAL #2   Title  Patient will improve right shoulder P/ROM to [facility] in order to don and doff shirts with less difficulty.    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    Time  4    Period  Weeks    Status  On-going  OT SHORT TERM GOAL #4    Title  Patient will decrease fascial restrictions from moderate to min-mod in order to have greater functional use of his right arm with daily tasks.    Time  4    Period  Weeks    Status  On-going        OT Long Term Goals - 08/04/18 0827      OT LONG TERM GOAL #1   Title  Patient will use his right arm as dominant with all B/IADLS and leisure activities.     Time  8    Period  Weeks    Status  On-going      OT LONG TERM GOAL #2   Title  Patient will improve his right shoulder A/ROM to [facility] in order to reach overhead to retrieve items in his wood shop and swing a golf club.     Time  8    Period  Weeks    Status  On-going      OT LONG TERM GOAL #3   Title  Patient will increase his right shoulder strength to 5/5 for improved ability to lift bags of yard waste.     Time  8    Period  Weeks    Status  On-going      OT LONG TERM GOAL #4   Title  Patient will decrease fascial restrictions to trace in his right shoulder for greater mobility needed for ADL completion.      Time  8    Period  Weeks    Status  On-going            Plan - 08/08/18 1133    Clinical Impression Statement  A:  Patient continues to experience discomfort at end range passive stretching this date, added aa/rom flexion, protraction, external rotation this date, with therapist assistance to maintain proper position and unweight arm     Pt will benefit from skilled therapeutic intervention in order to improve on the following performance deficits  Body Structure / Function / Physical Skills: Muscle spasms;Pain;ROM;Strength;Fascial restriction;UE functional use     Plan  P:  increase contraction time with isometrics, increase independence with aa/rom in supine, increase independence with prot/ret//elev/dep.          Patient will benefit from skilled therapeutic intervention in order to improve the following deficits and impairments:     Visit Diagnosis: Other symptoms and signs involving the  musculoskeletal system  Stiffness of right shoulder, not elsewhere classified    Problem List Patient Active Problem List   Diagnosis Date Noted  . H/O parotidectomy 06/13/2018  . COPD, group B, by GOLD 2017 classification ([place]) 02/02/2017  . Hemoptysis 06/18/2016  . Stiffness of left knee 11/08/2012  . Difficulty in walking(719.7) 11/08/2012  . Gout 03/04/2012  . ENLARGEMENT OF LYMPH NODES 08/21/2009  . CLOS FRACTURE MID/PROXIMAL PHALANX/PHALANG HAND 07/31/2009  . 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 COPD with chronic bronchitis ([place]) 12/06/2007  . COLONIC POLYPS, HX OF 12/05/2007  . RENAL CALCULUS, HX OF 12/05/2007    Vangie Bicker, [place], OTR/L [phone]  08/08/2018, 11:37 AM  [facility] [address] [place], [place], [address] Phone: [phone]   Fax:  [phone]  Name: Jerome Mays MRN: [id number] Date of Birth: 01-01-1941

## 2018-08-10 DIAGNOSIS — Z0000 Encounter for general adult medical examination without abnormal findings: Secondary | ICD-10-CM | POA: Diagnosis not present

## 2018-08-10 DIAGNOSIS — M109 Gout, unspecified: Secondary | ICD-10-CM | POA: Diagnosis not present

## 2018-08-10 DIAGNOSIS — D509 Iron deficiency anemia, unspecified: Secondary | ICD-10-CM | POA: Diagnosis not present

## 2018-08-10 DIAGNOSIS — M5416 Radiculopathy, lumbar region: Secondary | ICD-10-CM | POA: Diagnosis not present

## 2018-08-10 DIAGNOSIS — M1990 Unspecified osteoarthritis, unspecified site: Secondary | ICD-10-CM | POA: Diagnosis not present

## 2018-08-10 DIAGNOSIS — J449 Chronic obstructive pulmonary disease, unspecified: Secondary | ICD-10-CM | POA: Diagnosis not present

## 2018-08-10 DIAGNOSIS — C649 Malignant neoplasm of unspecified kidney, except renal pelvis: Secondary | ICD-10-CM | POA: Diagnosis not present

## 2018-08-10 DIAGNOSIS — Z1212 Encounter for screening for malignant neoplasm of rectum: Secondary | ICD-10-CM | POA: Diagnosis not present

## 2018-08-10 DIAGNOSIS — K219 Gastro-esophageal reflux disease without esophagitis: Secondary | ICD-10-CM | POA: Diagnosis not present

## 2018-08-10 DIAGNOSIS — M25562 Pain in left knee: Secondary | ICD-10-CM | POA: Diagnosis not present

## 2018-08-10 DIAGNOSIS — Z1331 Encounter for screening for depression: Secondary | ICD-10-CM | POA: Diagnosis not present

## 2018-08-11 ENCOUNTER — Ambulatory Visit (HOSPITAL_COMMUNITY): Payer: Medicare HMO

## 2018-08-11 ENCOUNTER — Encounter (HOSPITAL_COMMUNITY): Payer: Self-pay

## 2018-08-11 ENCOUNTER — Telehealth: Payer: Self-pay | Admitting: Pulmonary Disease

## 2018-08-11 DIAGNOSIS — R29898 Other symptoms and signs involving the musculoskeletal system: Secondary | ICD-10-CM

## 2018-08-11 DIAGNOSIS — M25611 Stiffness of right shoulder, not elsewhere classified: Secondary | ICD-10-CM | POA: Diagnosis not present

## 2018-08-11 MED ORDER — FLUTICASONE-UMECLIDIN-VILANT 100-62.5-25 MCG/INH IN AEPB: 1.0000 | INHALATION_SPRAY | Freq: Every day | RESPIRATORY_TRACT | 5 refills | Status: DC

## 2018-08-11 NOTE — Therapy (Signed)
[place], [place], [address] Phone: [phone]   Fax:  [phone]  Occupational Therapy Treatment  Patient Details  Name: Jerome Mays MRN: [id number] Date of Birth: Oct 23, 1940 Referring Provider (OT): Dr. Gladstone Lighter   Encounter Date: 08/11/2018  OT End of Session - 08/11/18 1419    Visit Number  4    Number of Visits  16    Date for OT Re-Evaluation  09/26/18   mini reassess on 08/29/18   Authorization Type  [employer] Medicare HMO    Authorization Time Period  no limitations - visits based on medical necessity    OT Start Time  1306    OT Stop Time  1345    OT Time Calculation (min)  39 min    Activity Tolerance  Patient tolerated treatment well    Behavior During Therapy  [facility] for tasks assessed/performed       Past Medical History:  Diagnosis Date  . Anemia    low iron  . Arthritis   . Colonic polyp   . Constipation   . COPD (chronic obstructive pulmonary disease) ([place])   . GERD (gastroesophageal reflux disease)   . Gout   . History of kidney stones   . Malignant tumor of kidney ([place])   . Pain    LOWER BACK AND LEFT HIP - HX OF PREVIOUS LUMBAR AND CERVICAL SURGERY--PT STATES HE HAS HAD NUMBNESS IN BOTH FEET SINCE HIS BACK SURGERY  . Renal calculus   . Retinal detachment     Past Surgical History:  Procedure Laterality Date  . BACK SURGERY     LUMBAR SURGERY  . CATARACT EXTRACTION Bilateral 2003   Surgeon unknown  . EYE SURGERY     BILATERAL CATARACT EXTRACTIONS  . GIVENS CAPSULE STUDY N/A 10/07/2015   Procedure: GIVENS CAPSULE STUDY;  Surgeon: Carol Ada, MD;  Location: [place];  Service: Endoscopy;  Laterality: N/A;  . HAND SURGERY     left  . KNEE SURGERY     right  . LAMINECTOMY     cervical  . LASER PHOTO ABLATION Left 08/19/2017   Procedure: LASER PHOTO ABLATION;  Surgeon: [name], MD;  Location: [place];  Service: Ophthalmology;  Laterality: Left;  . MEMBRANE PEEL Left  03/22/2017   Procedure: POSSIBLE MEMBRANE PEEL WITH SILICONE OIL AND PERFLURON;  Surgeon: [name], MD;  Location: [place];  Service: Ophthalmology;  Laterality: Left;  . NEPHRECTOMY     PT STATES RIGHT KIDNEY WAS REMOVED  . PAROTIDECTOMY Right 06/13/2018   Procedure: RIGHT TOTAL PAROTIDECTOMY;  Surgeon: Leta Baptist, MD;  Location: [place];  Service: ENT;  Laterality: Right;  . PARS PLANA VITRECTOMY Left 03/22/2017   Procedure: PARS PLANA VITRECTOMY WITH 25 GAUGE;  Surgeon: [name], MD;  Location: [place];  Service: Ophthalmology;  Laterality: Left;  . PARS PLANA VITRECTOMY W/ SCLERAL BUCKLE Left 08/19/2017   Procedure: 52 GAUGE PARS PLANA VITRECTOMY WITH SILCONE OIL REMOVAL;  Surgeon: [name], MD;  Location: [place];  Service: Ophthalmology;  Laterality: Left;  . 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 TOTAL KNEE ARTHROPLASTY Left 10/19/2012   Procedure: LEFT TOTAL KNEE ARTHROPLASTY;  Surgeon: Magnus Sinning, MD;  [facility];  Service: Orthopedics;  Laterality: Left;  [name] VITRECTOMY 25 GAUGE WITH SCLERAL BUCKLE Left 02/17/2017   Procedure: VITRECTOMY 25 GAUGE WITH SCLERAL BUCKLE membrane peel, injection of silicone oil, and endolaser photocoagulation.;  Surgeon: [name],  MD;  Location: [place];  Service: Ophthalmology;  Laterality: Left;    There were no vitals filed for this visit.  Subjective Assessment - 08/11/18 1317    Subjective   S: I probably do too much in the shop and that makes it sore sometimes. I have to help it with the left.     Currently in Pain?  No/denies         [place] OT Assessment - 08/11/18 1318      Assessment   Medical Diagnosis  S/P Right Rotator Cuff Repair      Precautions   Precautions  Shoulder    Type of Shoulder Precautions  standard protocol:  gentle gradual ROM progression                OT Treatments/Exercises (OP) - 08/11/18 1318      Exercises   Exercises  Shoulder      Shoulder Exercises: Supine    Protraction  PROM;5 reps;AAROM;10 reps    Horizontal ABduction  PROM;5 reps;AAROM;10 reps    External Rotation  PROM;5 reps;AAROM;10 reps    Internal Rotation  PROM;5 reps;AAROM;10 reps    Flexion  PROM;5 reps;AAROM;10 reps    ABduction  PROM;5 reps;AAROM;10 reps      Shoulder Exercises: Standing   Protraction  AAROM;10 reps    External Rotation  AAROM;10 reps    Internal Rotation  AAROM;10 reps    Flexion  AAROM;10 reps    Flexion Limitations  Just below shoulder level    ABduction  AAROM;10 reps      Shoulder Exercises: Pulleys   Flexion  1 minute   standing   Scaption  1 minute   standing   ABduction  --      Shoulder Exercises: ROM/Strengthening   Other ROM/Strengthening Exercises  Table wash at waist level 1'      Shoulder Exercises: Isometric Strengthening   Other Isometric Exercises  Elbow extension isometric; standing; 3x10"      Manual Therapy   Manual Therapy  Myofascial release    Manual therapy comments  manual therapy interventions completed seperately from all other interventions this date of service     Myofascial Release  myofascial release and manual stretching to right upper arm, scapular, and shoulder region, as well as scar release to right surgical scar, to decrease fascial restrictions and tightness              OT Education - 08/11/18 1418    Education Details  elbow extension isomertic using doorframe to increase tricep strength.     Person(s) Educated  Patient    Methods  Explanation;Demonstration    Comprehension  Verbalized understanding;Returned demonstration       OT Short Term Goals - 08/08/18 1136      OT SHORT TERM GOAL #1   Title  Patient will be educated on HEP for improved right shoulder mobility and strength needed for ADL and leisure activity completion.      Time  4    Period  Weeks    Status  On-going    Target Date  08/29/18      OT SHORT TERM GOAL #2   Title  Patient will improve right shoulder P/ROM to [facility] in order  to don and doff shirts with less difficulty.    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    Time  4    Period  Weeks    Status  On-going      OT SHORT TERM GOAL #4   Title  Patient will decrease fascial restrictions from moderate to min-mod in order to have greater functional use of his right arm with daily tasks.    Time  4    Period  Weeks    Status  On-going        OT Long Term Goals - 08/04/18 0827      OT LONG TERM GOAL #1   Title  Patient will use his right arm as dominant with all B/IADLS and leisure activities.     Time  8    Period  Weeks    Status  On-going      OT LONG TERM GOAL #2   Title  Patient will improve his right shoulder A/ROM to [facility] in order to reach overhead to retrieve items in his wood shop and swing a golf club.     Time  8    Period  Weeks    Status  On-going      OT LONG TERM GOAL #3   Title  Patient will increase his right shoulder strength to 5/5 for improved ability to lift bags of yard waste.     Time  8    Period  Weeks    Status  On-going      OT LONG TERM GOAL #4   Title  Patient will decrease fascial restrictions to trace in his right shoulder for greater mobility needed for ADL completion.      Time  8    Period  Weeks    Status  On-going            Plan - 08/11/18 1420    Clinical Impression Statement  A: Patient was able to complete all supine AA/ROM exercises and all but horizontal abduction standing. Increased difficulty with shoulder and scapular stability. Modified pulleys to complete scaption versus abduction due to weakness and poor form.     Body Structure / Function / Physical Skills  Muscle spasms;Pain;ROM;Strength;Fascial restriction;UE functional use    Plan  P: Follow up elbow isometrics. Continue with AA/ROM supine and standing. Focus on increasing form and technique  while working towards the ability to complete horizontal abduction with pvc pipe. Progress towards completion of wall wash.     Consulted and Agree with Plan of Care  Patient       Patient will benefit from skilled therapeutic intervention in order to improve the following deficits and impairments:  Body Structure / Function / Physical Skills  Visit Diagnosis: Other symptoms and signs involving the musculoskeletal system  Stiffness of right shoulder, not elsewhere classified    Problem List Patient Active Problem List   Diagnosis Date Noted  . H/O parotidectomy 06/13/2018  . COPD, group B, by GOLD 2017 classification ([place]) 02/02/2017  . Hemoptysis 06/18/2016  . Stiffness of left knee 11/08/2012  . Difficulty in walking(719.7) 11/08/2012  . Gout 03/04/2012  . ENLARGEMENT OF LYMPH NODES 08/21/2009  . CLOS FRACTURE MID/PROXIMAL PHALANX/PHALANG HAND 07/31/2009  . 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 COPD with chronic bronchitis ([place]) 12/06/2007  . COLONIC POLYPS, HX OF 12/05/2007  . RENAL CALCULUS, HX OF 12/05/2007   Ailene Ravel, OTR/L,CBIS  [phone]  08/11/2018, 2:23 PM  [place]  [facility] [address] [place], [place], [address] Phone: [phone]   Fax:  [phone]  Name: DARIEL BETZER MRN: [id number] Date of Birth: 1941-04-24

## 2018-08-11 NOTE — Telephone Encounter (Signed)
Refill sent patient is aware nothing further needed.

## 2018-08-15 ENCOUNTER — Ambulatory Visit (HOSPITAL_COMMUNITY): Payer: Medicare HMO | Admitting: Specialist

## 2018-08-15 ENCOUNTER — Encounter (HOSPITAL_COMMUNITY): Payer: Self-pay | Admitting: Specialist

## 2018-08-15 DIAGNOSIS — M25611 Stiffness of right shoulder, not elsewhere classified: Secondary | ICD-10-CM

## 2018-08-15 DIAGNOSIS — R29898 Other symptoms and signs involving the musculoskeletal system: Secondary | ICD-10-CM

## 2018-08-15 NOTE — Therapy (Signed)
[place], [place], [address] Phone: [phone]   Fax:  [phone]  Occupational Therapy Treatment  Patient Details  Name: Jerome Mays MRN: [id number] Date of Birth: [birth year] Referring Provider (OT): Dr. Gladstone Lighter   Encounter Date: 08/15/2018  OT End of Session - 08/15/18 1030    Visit Number  5    Number of Visits  16    Date for OT Re-Evaluation  09/26/18   mini reassess on 08/29/18   Authorization Type  [employer] Medicare HMO    Authorization Time Period  no limitations - visits based on medical necessity    OT Start Time  0950    OT Stop Time  1030    OT Time Calculation (min)  40 min    Activity Tolerance  Patient tolerated treatment well    Behavior During Therapy  [facility] for tasks assessed/performed       Past Medical History:  Diagnosis Date  . Anemia    low iron  . Arthritis   . Colonic polyp   . Constipation   . COPD (chronic obstructive pulmonary disease) ([place])   . GERD (gastroesophageal reflux disease)   . Gout   . History of kidney stones   . Malignant tumor of kidney ([place])   . Pain    LOWER BACK AND LEFT HIP - HX OF PREVIOUS LUMBAR AND CERVICAL SURGERY--PT STATES HE HAS HAD NUMBNESS IN BOTH FEET SINCE HIS BACK SURGERY  . Renal calculus   . Retinal detachment     Past Surgical History:  Procedure Laterality Date  . BACK SURGERY     LUMBAR SURGERY  . CATARACT EXTRACTION Bilateral 2003   Surgeon unknown  . EYE SURGERY     BILATERAL CATARACT EXTRACTIONS  . GIVENS CAPSULE STUDY N/A 10/07/2015   Procedure: GIVENS CAPSULE STUDY;  Surgeon: Carol Ada, MD;  Location: [place];  Service: Endoscopy;  Laterality: N/A;  . HAND SURGERY     left  . KNEE SURGERY     right  . LAMINECTOMY     cervical  . LASER PHOTO ABLATION Left 08/19/2017   Procedure: LASER PHOTO ABLATION;  Surgeon: [name], MD;  Location: [place];  Service: Ophthalmology;  Laterality: Left;  . MEMBRANE PEEL Left  03/22/2017   Procedure: POSSIBLE MEMBRANE PEEL WITH SILICONE OIL AND PERFLURON;  Surgeon: [name], MD;  Location: [place];  Service: Ophthalmology;  Laterality: Left;  . NEPHRECTOMY     PT STATES RIGHT KIDNEY WAS REMOVED  . PAROTIDECTOMY Right 06/13/2018   Procedure: RIGHT TOTAL PAROTIDECTOMY;  Surgeon: Leta Baptist, MD;  Location: [place];  Service: ENT;  Laterality: Right;  . PARS PLANA VITRECTOMY Left 03/22/2017   Procedure: PARS PLANA VITRECTOMY WITH 25 GAUGE;  Surgeon: [name], MD;  Location: [place];  Service: Ophthalmology;  Laterality: Left;  . PARS PLANA VITRECTOMY W/ SCLERAL BUCKLE Left 08/19/2017   Procedure: 2 GAUGE PARS PLANA VITRECTOMY WITH SILCONE OIL REMOVAL;  Surgeon: [name], MD;  Location: [place];  Service: Ophthalmology;  Laterality: Left;  . 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 TOTAL KNEE ARTHROPLASTY Left 10/19/2012   Procedure: LEFT TOTAL KNEE ARTHROPLASTY;  Surgeon: Magnus Sinning, MD;  [facility];  Service: Orthopedics;  Laterality: Left;  [name] VITRECTOMY 25 GAUGE WITH SCLERAL BUCKLE Left 02/17/2017   Procedure: VITRECTOMY 25 GAUGE WITH SCLERAL BUCKLE membrane peel, injection of silicone oil, and endolaser photocoagulation.;  Surgeon: [name],  MD;  Location: [place];  Service: Ophthalmology;  Laterality: Left;    There were no vitals filed for this visit.  Subjective Assessment - 08/15/18 1029    Subjective   S:  I have been doing that exercise where I push my arm against the wall    Currently in Pain?  No/denies         [facility] OT Assessment - 08/15/18 0001      Assessment   Medical Diagnosis  S/P Right Rotator Cuff Repair      Precautions   Precautions  Shoulder    Type of Shoulder Precautions  standard protocol:  gentle gradual ROM progression                OT Treatments/Exercises (OP) - 08/15/18 0001      Exercises   Exercises  Shoulder      Shoulder Exercises: Supine   Protraction  PROM;10 reps;AAROM;12  reps    Horizontal ABduction  PROM;5 reps;AAROM;12 reps    Horizontal ABduction Limitations  THERAPIST UNWEIGHTING ARM    External Rotation  PROM;5 reps;AAROM;12 reps    External Rotation Limitations  therapist provided manual cuing to maintain adduction    Internal Rotation  PROM;5 reps;AAROM;15 reps    Flexion  PROM;10 reps;AAROM;12 reps    ABduction  PROM;5 reps;AAROM;12 reps      Shoulder Exercises: Seated   Elevation  AROM;15 reps    Extension  AROM;15 reps    Row  AROM;15 reps      Shoulder Exercises: Pulleys   Flexion  --   30 seconds unable to complete 1 min   Scaption  Other (comment)   30 seconds, unable to complete 1 minute      Shoulder Exercises: Therapy Ball   Flexion  20 reps    ABduction  20 reps      Shoulder Exercises: ROM/Strengthening   Wall Wash  1' low level    Thumb Tacks  1' low level    Prot/Ret//Elev/Dep  1' with elbows bent     Rhythmic Stabilization, Supine  attempted, patient unable to maintain arm position       Manual Therapy   Manual Therapy  Myofascial release    Manual therapy comments  manual therapy interventions completed seperately from all other interventions this date of service     Myofascial Release  myofascial release and manual stretching to right upper arm, scapular, and shoulder region, as well as scar release to right surgical scar, to decrease fascial restrictions and tightness              OT Education - 08/15/18 1030    Education Details  issued aa/rom for shoulder in supine only    Person(s) Educated  Patient    Methods  Explanation;Demonstration;Handout    Comprehension  Verbalized understanding;Returned demonstration       OT Short Term Goals - 08/08/18 1136      OT SHORT TERM GOAL #1   Title  Patient will be educated on HEP for improved right shoulder mobility and strength needed for ADL and leisure activity completion.      Time  4    Period  Weeks    Status  On-going    Target Date  08/29/18      OT  SHORT TERM GOAL #2   Title  Patient will improve right shoulder P/ROM to [facility] in order to don and doff shirts with less difficulty.    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    Time  4    Period  Weeks    Status  On-going      OT SHORT TERM GOAL #4   Title  Patient will decrease fascial restrictions from moderate to min-mod in order to have greater functional use of his right arm with daily tasks.    Time  4    Period  Weeks    Status  On-going        OT Long Term Goals - 08/04/18 0827      OT LONG TERM GOAL #1   Title  Patient will use his right arm as dominant with all B/IADLS and leisure activities.     Time  8    Period  Weeks    Status  On-going      OT LONG TERM GOAL #2   Title  Patient will improve his right shoulder A/ROM to [facility] in order to reach overhead to retrieve items in his wood shop and swing a golf club.     Time  8    Period  Weeks    Status  On-going      OT LONG TERM GOAL #3   Title  Patient will increase his right shoulder strength to 5/5 for improved ability to lift bags of yard waste.     Time  8    Period  Weeks    Status  On-going      OT LONG TERM GOAL #4   Title  Patient will decrease fascial restrictions to trace in his right shoulder for greater mobility needed for ADL completion.      Time  8    Period  Weeks    Status  On-going            Plan - 08/15/18 1031    Clinical Impression Statement  A: completed aa/rom in supine only, as range is not sufficient to complete in standing due to decreased strength.  therapist provided cuing and unweighing with horizontal abduction and abduction.  attempted pulleys for 1 min, fatigued after 30 seconds.  attempted rhythmic stabilization, however not sufficient strength to hold arm at 90 degreees flexion independently.     Body Structure / Function /  Physical Skills  Muscle spasms;Pain;ROM;Strength;Fascial restriction;UE functional use    Plan  P:  complete aa/ rom in supine, follow up on hep completion of aa/rom.  attempt rhythmic stabilization and pulleys for 1 min.         Patient will benefit from skilled therapeutic intervention in order to improve the following deficits and impairments:  Body Structure / Function / Physical Skills  Visit Diagnosis: Stiffness of right shoulder, not elsewhere classified  Other symptoms and signs involving the musculoskeletal system    Problem List Patient Active Problem List   Diagnosis Date Noted  . H/O parotidectomy 06/13/2018  . COPD, group B, by GOLD 2017 classification ([place]) 02/02/2017  . Hemoptysis 06/18/2016  . Stiffness of left knee 11/08/2012  . Difficulty in walking(719.7) 11/08/2012  . Gout 03/04/2012  . ENLARGEMENT OF LYMPH NODES 08/21/2009  . CLOS FRACTURE MID/PROXIMAL PHALANX/PHALANG HAND 07/31/2009  . 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 COPD with chronic bronchitis ([place]) 12/06/2007  . COLONIC POLYPS, HX OF 12/05/2007  .  RENAL CALCULUS, HX OF 12/05/2007    Vangie Bicker, [place], OTR/L [phone]  08/15/2018, 10:35 AM  [place] [address] [place], [place], [address] Phone: [phone]   Fax:  [phone]  Name: Jerome Mays MRN: [id number] Date of Birth: 01-Apr-1941

## 2018-08-15 NOTE — Patient Instructions (Signed)
Perform each exercise ____10____ reps. 2-3x days.  COMPLETE EACH EXERCISE LYING DOWN ON YOUR BACK Protraction Start by holding a wand or cane at chest height.  Next, slowly push the wand outwards in front of your body so that your elbows become fully straightened. Then, return to the original position.     Shoulder FLEXION -  In the standing position, hold a wand/cane with both arms, palms up on both sides. Raise up the wand/cane allowing your unaffected arm to perform most of the effort. Your affected arm should be partially relaxed.      Internal/External ROTATION -  In the standing position, hold a wand/cane with both hands keeping your elbows bent. Move your arms and wand/cane to one side.  Your affected arm should be partially relaxed while your unaffected arm performs most of the effort.       Shoulder ABDUCTION - While holding a wand/cane palm face up on the injured side and palm face down on the uninjured side, slowly raise up your injured arm to the side.                     Horizontal Abduction/Adduction      Straight arms holding cane at shoulder height, bring cane to right, center, left. Repeat starting to left.   Copyright  VHI. All rights reserved.

## 2018-08-18 ENCOUNTER — Ambulatory Visit (HOSPITAL_COMMUNITY): Payer: Medicare HMO

## 2018-08-18 ENCOUNTER — Other Ambulatory Visit: Payer: Self-pay

## 2018-08-18 ENCOUNTER — Encounter (HOSPITAL_COMMUNITY): Payer: Self-pay

## 2018-08-18 DIAGNOSIS — R29898 Other symptoms and signs involving the musculoskeletal system: Secondary | ICD-10-CM

## 2018-08-18 DIAGNOSIS — M25611 Stiffness of right shoulder, not elsewhere classified: Secondary | ICD-10-CM | POA: Diagnosis not present

## 2018-08-18 NOTE — Therapy (Signed)
[place], [place], [address] Phone: [phone]   Fax:  [phone]  Occupational Therapy Treatment  Patient Details  Name: Jerome Mays MRN: [id number] Date of Birth: July 29, 1940 Referring Provider (OT): Dr. Gladstone Lighter   Encounter Date: 08/18/2018  OT End of Session - 08/18/18 1040    Visit Number  6    Number of Visits  16    Date for OT Re-Evaluation  09/26/18   mini reassess on 08/29/18   Authorization Type  [employer] HMO    Authorization Time Period  no limitations - visits based on medical necessity    OT Start Time  0818    OT Stop Time  0900    OT Time Calculation (min)  42 min    Activity Tolerance  Patient tolerated treatment well    Behavior During Therapy  [facility] for tasks assessed/performed       Past Medical History:  Diagnosis Date  . Anemia    low iron  . Arthritis   . Colonic polyp   . Constipation   . COPD (chronic obstructive pulmonary disease) ([place])   . GERD (gastroesophageal reflux disease)   . Gout   . History of kidney stones   . Malignant tumor of kidney ([place])   . Pain    LOWER BACK AND LEFT HIP - HX OF PREVIOUS LUMBAR AND CERVICAL SURGERY--PT STATES HE HAS HAD NUMBNESS IN BOTH FEET SINCE HIS BACK SURGERY  . Renal calculus   . Retinal detachment     Past Surgical History:  Procedure Laterality Date  . BACK SURGERY     LUMBAR SURGERY  . CATARACT EXTRACTION Bilateral 2003   Surgeon unknown  . EYE SURGERY     BILATERAL CATARACT EXTRACTIONS  . GIVENS CAPSULE STUDY N/A 10/07/2015   Procedure: GIVENS CAPSULE STUDY;  Surgeon: Carol Ada, MD;  Location: [place];  Service: Endoscopy;  Laterality: N/A;  . HAND SURGERY     left  . KNEE SURGERY     right  . LAMINECTOMY     cervical  . LASER PHOTO ABLATION Left 08/19/2017   Procedure: LASER PHOTO ABLATION;  Surgeon: [name], MD;  Location: [place];  Service: Ophthalmology;  Laterality: Left;  . MEMBRANE PEEL Left  03/22/2017   Procedure: POSSIBLE MEMBRANE PEEL WITH SILICONE OIL AND PERFLURON;  Surgeon: [name], MD;  Location: [place];  Service: Ophthalmology;  Laterality: Left;  . NEPHRECTOMY     PT STATES RIGHT KIDNEY WAS REMOVED  . PAROTIDECTOMY Right 06/13/2018   Procedure: RIGHT TOTAL PAROTIDECTOMY;  Surgeon: Leta Baptist, MD;  Location: [place];  Service: ENT;  Laterality: Right;  . PARS PLANA VITRECTOMY Left 03/22/2017   Procedure: PARS PLANA VITRECTOMY WITH 25 GAUGE;  Surgeon: [name], MD;  Location: [place];  Service: Ophthalmology;  Laterality: Left;  . PARS PLANA VITRECTOMY W/ SCLERAL BUCKLE Left 08/19/2017   Procedure: 25 GAUGE PARS PLANA VITRECTOMY WITH SILCONE OIL REMOVAL;  Surgeon: [name], MD;  Location: [place];  Service: Ophthalmology;  Laterality: Left;  . 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 TOTAL KNEE ARTHROPLASTY Left 10/19/2012   Procedure: LEFT TOTAL KNEE ARTHROPLASTY;  Surgeon: Magnus Sinning, MD;  Location: [facility];  Service: Orthopedics;  Laterality: Left;  [name] VITRECTOMY 25 GAUGE WITH SCLERAL BUCKLE Left 02/17/2017   Procedure: VITRECTOMY 25 GAUGE WITH SCLERAL BUCKLE membrane peel, injection of silicone oil, and endolaser photocoagulation.;  Surgeon: [name],  MD;  Location: [place];  Service: Ophthalmology;  Laterality: Left;    There were no vitals filed for this visit.  Subjective Assessment - 08/18/18 0844    Subjective   S: I've been working the in the shop alot and it may make it sore.     Currently in Pain?  No/denies         [facility] OT Assessment - 08/18/18 0852      Assessment   Medical Diagnosis  S/P Right Rotator Cuff Repair      Precautions   Precautions  Shoulder    Type of Shoulder Precautions  standard protocol:  gentle gradual ROM progression                OT Treatments/Exercises (OP) - 08/18/18 1020      Exercises   Exercises  Shoulder      Shoulder Exercises: Supine   Protraction  PROM;5 reps;AAROM;12 reps     Horizontal ABduction  PROM;5 reps;AAROM;12 reps    External Rotation  PROM;5 reps;AAROM;12 reps    Internal Rotation  PROM;5 reps;AAROM;12 reps    Flexion  PROM;5 reps;AAROM;12 reps    ABduction  PROM;5 reps;AAROM;12 reps      Shoulder Exercises: Standing   Protraction  AAROM;10 reps    External Rotation  AAROM;10 reps    Internal Rotation  AAROM;10 reps    Flexion  AAROM;10 reps    Flexion Limitations  Just below shoulder level    ABduction  AAROM;10 reps      Shoulder Exercises: Pulleys   Flexion  1 minute   standing. physical faciliation to depress shoulder   Scaption  1 minute    Scaption Limitations  Physical assistance to depress shoulder. VC to keep elbow straight      Shoulder Exercises: ROM/Strengthening   Wall Wash  1' low level    Other ROM/Strengthening Exercises  PVC pipe slide; 10X      Manual Therapy   Manual Therapy  Myofascial release    Manual therapy comments  manual therapy interventions completed seperately from all other interventions this date of service     Myofascial Release  myofascial release and manual stretching to right upper arm, scapular, and shoulder region, as well as scar release to right surgical scar, to decrease fascial restrictions and tightness                OT Short Term Goals - 08/08/18 1136      OT SHORT TERM GOAL #1   Title  Patient will be educated on HEP for improved right shoulder mobility and strength needed for ADL and leisure activity completion.      Time  4    Period  Weeks    Status  On-going    Target Date  08/29/18      OT SHORT TERM GOAL #2   Title  Patient will improve right shoulder P/ROM to [facility] in order to don and doff shirts with less difficulty.    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    Time  4    Period  Weeks    Status  On-going      OT SHORT  TERM GOAL #4   Title  Patient will decrease  fascial restrictions from moderate to min-mod in order to have greater functional use of his right arm with daily tasks.    Time  4    Period  Weeks    Status  On-going        OT Long Term Goals - 08/04/18 0827      OT LONG TERM GOAL #1   Title  Patient will use his right arm as dominant with all B/IADLS and leisure activities.     Time  8    Period  Weeks    Status  On-going      OT LONG TERM GOAL #2   Title  Patient will improve his right shoulder A/ROM to [facility] in order to reach overhead to retrieve items in his wood shop and swing a golf club.     Time  8    Period  Weeks    Status  On-going      OT LONG TERM GOAL #3   Title  Patient will increase his right shoulder strength to 5/5 for improved ability to lift bags of yard waste.     Time  8    Period  Weeks    Status  On-going      OT LONG TERM GOAL #4   Title  Patient will decrease fascial restrictions to trace in his right shoulder for greater mobility needed for ADL completion.      Time  8    Period  Weeks    Status  On-going            Plan - 08/18/18 1040    Clinical Impression Statement  A: Focused on form and technique while standing during AA/ROM by provided movement modifications. tactile cueing provided during pulleys to help cue the scapula to depress. Pt continues to require VC for form and technique especially when keeping his elbow straight.     Body Structure / Function / Physical Skills  Muscle spasms;Pain;ROM;Strength;Fascial restriction;UE functional use    Plan  P: Attempt rhythmic stabilization. Complete standing horizontal abduction at a low level. Try an incline wall wash using mat table.     Consulted and Agree with Plan of Care  Patient       Patient will benefit from skilled therapeutic intervention in order to improve the following deficits and impairments:  Body Structure / Function / Physical Skills  Visit Diagnosis: Stiffness of right  shoulder, not elsewhere classified  Other symptoms and signs involving the musculoskeletal system    Problem List Patient Active Problem List   Diagnosis Date Noted  . H/O parotidectomy 06/13/2018  . COPD, group B, by GOLD 2017 classification ([place]) 02/02/2017  . Hemoptysis 06/18/2016  . Stiffness of left knee 11/08/2012  . Difficulty in walking(719.7) 11/08/2012  . Gout 03/04/2012  . ENLARGEMENT OF LYMPH NODES 08/21/2009  . CLOS FRACTURE MID/PROXIMAL PHALANX/PHALANG HAND 07/31/2009  . 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 COPD with chronic bronchitis ([place]) 12/06/2007  . COLONIC POLYPS, HX OF 12/05/2007  . RENAL CALCULUS, HX OF 12/05/2007   Ailene Ravel, OTR/L,CBIS  [phone]  08/18/2018, 10:46 AM  [facility] [address] [place], [place], [address] Phone: [phone]   Fax:  [phone]  Name: Jerome Mays MRN: [id number] Date of Birth: 12-Apr-1941

## 2018-08-22 ENCOUNTER — Telehealth (HOSPITAL_COMMUNITY): Payer: Self-pay | Admitting: Internal Medicine

## 2018-08-22 ENCOUNTER — Ambulatory Visit (HOSPITAL_COMMUNITY): Payer: Medicare HMO | Admitting: Specialist

## 2018-08-22 NOTE — Telephone Encounter (Signed)
08/22/18  wife called to cx said that he has been sick for the last few days

## 2018-08-25 ENCOUNTER — Ambulatory Visit (HOSPITAL_COMMUNITY): Payer: Medicare HMO

## 2018-08-25 ENCOUNTER — Telehealth (HOSPITAL_COMMUNITY): Payer: Self-pay | Admitting: Internal Medicine

## 2018-08-25 NOTE — Telephone Encounter (Signed)
08/25/18  Pt left a message to cx but didn't give a reason

## 2018-08-26 ENCOUNTER — Telehealth (HOSPITAL_COMMUNITY): Payer: Self-pay | Admitting: Occupational Therapy

## 2018-08-26 NOTE — Telephone Encounter (Signed)
Called and spoke with wife, [name], regarding 2 week clinic closure for COVID-19 precautions. Discussed HEP and asked to call with any questions. Will check in with pt during closure.   Guadelupe Sabin, OTR/L  [phone] 08/26/2018

## 2018-08-29 ENCOUNTER — Ambulatory Visit (HOSPITAL_COMMUNITY): Payer: Medicare HMO | Admitting: Specialist

## 2018-09-01 ENCOUNTER — Ambulatory Visit (HOSPITAL_COMMUNITY): Payer: Medicare HMO

## 2018-09-02 ENCOUNTER — Telehealth (HOSPITAL_COMMUNITY): Payer: Self-pay | Admitting: Occupational Therapy

## 2018-09-02 NOTE — Telephone Encounter (Signed)
Called and spoke with pt regarding HEP. Pt went to MD this morning and MD added wall abduction stretch. Pt reports he is able to achieve approximately 50% ROM. He is completing in supine and standing, still feels stretch at end range. Will follow up with pt next week.    Guadelupe Sabin, OTR/L  [phone] 09/02/2018

## 2018-09-05 ENCOUNTER — Encounter (HOSPITAL_COMMUNITY): Payer: Medicare HMO | Admitting: Specialist

## 2018-09-07 DIAGNOSIS — L570 Actinic keratosis: Secondary | ICD-10-CM | POA: Diagnosis not present

## 2018-09-07 DIAGNOSIS — L299 Pruritus, unspecified: Secondary | ICD-10-CM | POA: Diagnosis not present

## 2018-09-07 DIAGNOSIS — L821 Other seborrheic keratosis: Secondary | ICD-10-CM | POA: Diagnosis not present

## 2018-09-07 DIAGNOSIS — L280 Lichen simplex chronicus: Secondary | ICD-10-CM | POA: Diagnosis not present

## 2018-09-07 DIAGNOSIS — R531 Weakness: Secondary | ICD-10-CM | POA: Diagnosis not present

## 2018-09-08 ENCOUNTER — Telehealth (HOSPITAL_COMMUNITY): Payer: Self-pay | Admitting: Occupational Therapy

## 2018-09-08 ENCOUNTER — Encounter (HOSPITAL_COMMUNITY): Payer: Self-pay | Admitting: Occupational Therapy

## 2018-09-08 ENCOUNTER — Encounter (HOSPITAL_COMMUNITY): Payer: Medicare HMO

## 2018-09-08 NOTE — Telephone Encounter (Signed)
Spoke with wife, [name], and pt does not want to come out due to COPD and being susceptible to virus. OT verbalized understand and will mail updated HEP and call pt to discuss. Pt does not have a computer-no access to telehealth or email.    Jerome Mays, OTR/L  [phone] 42/2020

## 2018-09-08 NOTE — Therapy (Signed)
[place], [place], [address] Phone: [phone]   Fax:  [phone]  Patient Details  Name: Jerome Mays MRN: [id number] Date of Birth: [birth year] Referring Provider:  No ref. provider found  Encounter Date: 09/08/2018   Mailed shoulder stretching HEP on 09/08/18 to home address.   -shoulder flexion -shoulder abduction -internal rotation with horizontal towel -cross chest stretch -corner stretch  Instructions to complete 5x each, hold for 10 seconds each, complete 2x/day.    Guadelupe Sabin, OTR/L  [phone] 09/08/2018, 2:32 PM  [facility] [address] [place], [place], [address] Phone: [phone]   Fax:  [phone]

## 2018-09-08 NOTE — Telephone Encounter (Signed)
Patient was contacted today regarding the temporary reduction of OP rehab services due to concerns for community transmission of Covid-19.   Pt's wife states pt is diligent in completing HEP, however is becoming frustrated with arm functioning and is not seeing much progress at this time.  The patient was offered and declined the continuation in their POC by using methods such as an e-visit, virtual check-in, or telehealth visit as they do not have access to necessary equipment/services. Discussed in-person visit for HEP update or to schedule visits as deemed appropriate to prevent regression of function.   Wife, [name], will speak to pt regarding interest in in-person visit and call back.   Guadelupe Sabin, OTR/L  [phone] 09/08/2018

## 2018-09-12 ENCOUNTER — Ambulatory Visit (HOSPITAL_COMMUNITY): Payer: Medicare HMO

## 2018-09-14 DIAGNOSIS — R946 Abnormal results of thyroid function studies: Secondary | ICD-10-CM | POA: Diagnosis not present

## 2018-09-14 DIAGNOSIS — D509 Iron deficiency anemia, unspecified: Secondary | ICD-10-CM | POA: Diagnosis not present

## 2018-09-14 DIAGNOSIS — L299 Pruritus, unspecified: Secondary | ICD-10-CM | POA: Diagnosis not present

## 2018-09-15 ENCOUNTER — Encounter (HOSPITAL_COMMUNITY): Payer: Medicare HMO

## 2018-09-15 ENCOUNTER — Telehealth (HOSPITAL_COMMUNITY): Payer: Self-pay | Admitting: Occupational Therapy

## 2018-09-15 NOTE — Telephone Encounter (Signed)
Called and spoke to wife [name], who reports HEP arrived in the mail today. [facility] will attempt over the weekend and OT will touch base at the beginning of next week. Also discussed possibility of referring to [facility] if shoulder is not progressing-will discuss further at next phone call.    Guadelupe Sabin, OTR/L  [phone] 09/15/2018

## 2018-09-23 ENCOUNTER — Telehealth (HOSPITAL_COMMUNITY): Payer: Self-pay | Admitting: Occupational Therapy

## 2018-09-23 NOTE — Telephone Encounter (Signed)
Weekly follow-up call with COVID-19 clinic closure: left message for pt regarding HEP. Asked pt to return call if he has any questions or concerns.   Guadelupe Sabin, OTR/L  [phone] 09/23/2018

## 2018-09-28 DIAGNOSIS — L814 Other melanin hyperpigmentation: Secondary | ICD-10-CM | POA: Diagnosis not present

## 2018-09-28 DIAGNOSIS — L280 Lichen simplex chronicus: Secondary | ICD-10-CM | POA: Diagnosis not present

## 2018-09-28 DIAGNOSIS — D485 Neoplasm of uncertain behavior of skin: Secondary | ICD-10-CM | POA: Diagnosis not present

## 2018-09-28 DIAGNOSIS — E039 Hypothyroidism, unspecified: Secondary | ICD-10-CM | POA: Diagnosis not present

## 2018-09-28 DIAGNOSIS — L821 Other seborrheic keratosis: Secondary | ICD-10-CM | POA: Diagnosis not present

## 2018-09-30 ENCOUNTER — Telehealth (HOSPITAL_COMMUNITY): Payer: Self-pay

## 2018-09-30 NOTE — Telephone Encounter (Signed)
Weekly follow-up call with COVID-19 clinic closure: left message for pt regarding HEP. Asked pt to return call if he has any questions or concerns.  Ailene Ravel, OTR/L,CBIS  [phone]

## 2018-10-07 ENCOUNTER — Telehealth (HOSPITAL_COMMUNITY): Payer: Self-pay | Admitting: Occupational Therapy

## 2018-10-07 NOTE — Telephone Encounter (Signed)
Follow up call to pt regarding HEP. Pt reports he is completing his HEPs 3x/day and he is improving his ROM however it is still not as high as he would like. He is no longer as frustrated as he was a few weeks ago and would like to continue with HEPs and weekly follow up calls. Reviewed HEP and instructed pt to discontinue towel slides and to continue AA/ROM, stretches, and pulley exercises.    Guadelupe Sabin, OTR/L  [phone] 10/07/2018

## 2018-10-12 IMAGING — CT CT NECK W/ CM
5 of 6 series · 14 of 33 positions shown, 16 images · IV contrast (omnipaque)
Comparison: Whole-body PET scan 08/23/2009.

CLINICAL DATA: Parotid mass.

EXAM:
CT NECK WITH CONTRAST
TECHNIQUE: Multidetector CT imaging of the neck was performed using the
standard protocol following the bolus administration of intravenous
contrast.
CONTRAST:  75mL OMNIPAQUE IOHEXOL 300 MG/ML  SOLN

[Series 2: axial neck · axial · 0.48mm/px · z∈[-128,-48]mm · 2 of 122 slices shown, 3 images]
[im 41/122  soft-tissue]
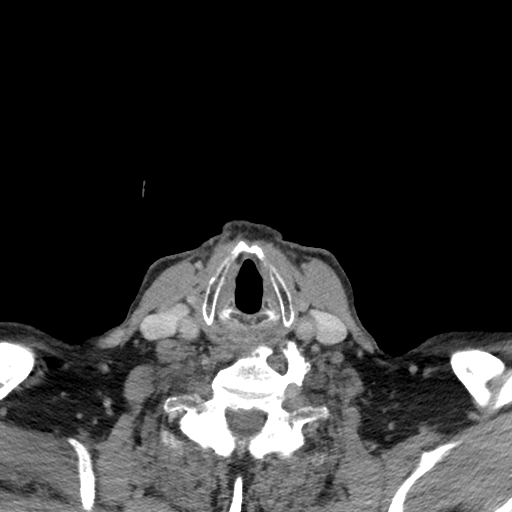
[im 41/122  bone]
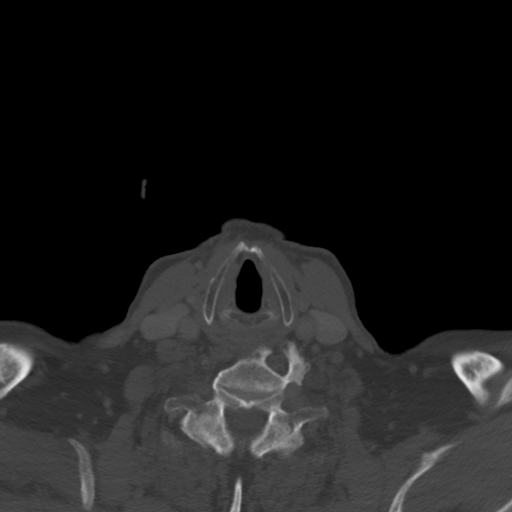
[im 81/122  bone]
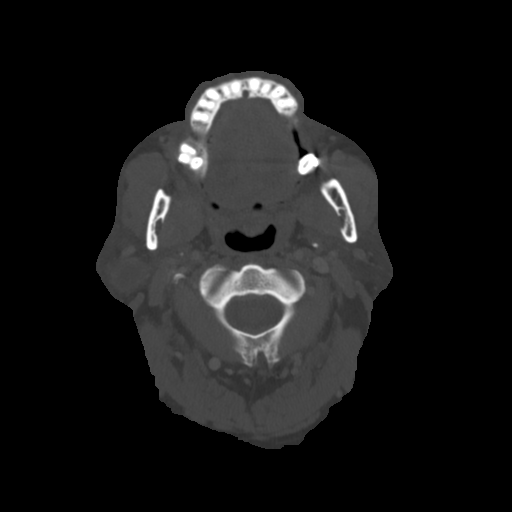

[Series 6: coronal neck · coronal · 0.45mm/px · 3 of 130 slices shown]
[im 26/130  bone]
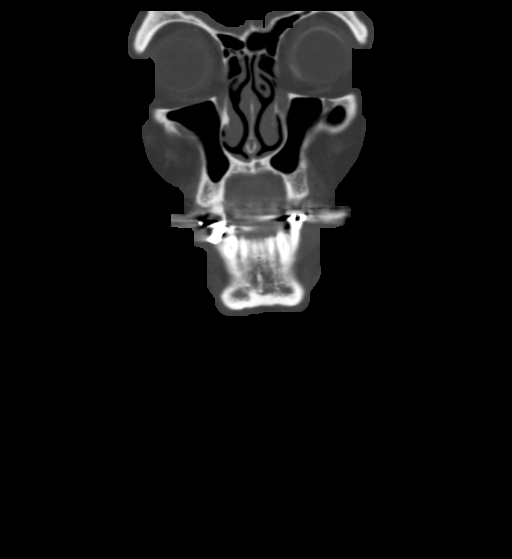
[im 52/130  bone]
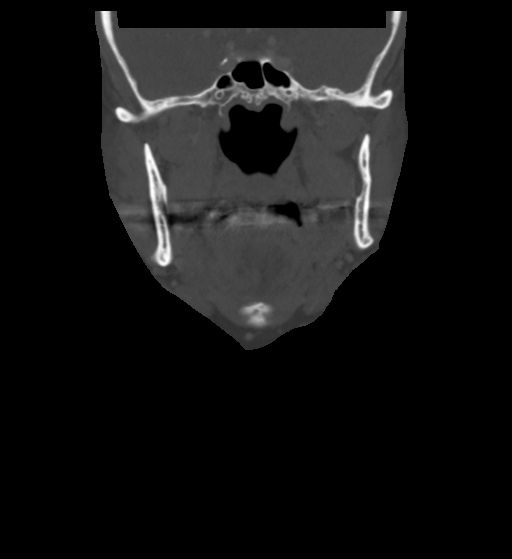
[im 78/130  bone]
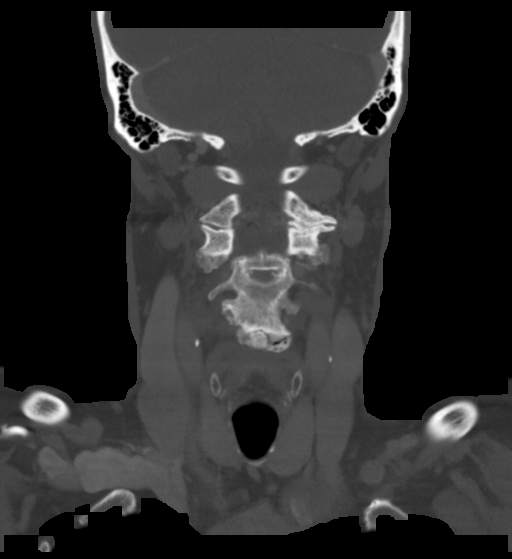

[Series 7: sagittal neck · sagittal · 0.47mm/px · 5 of 101 slices shown, 6 images]
[im 34/101  bone]
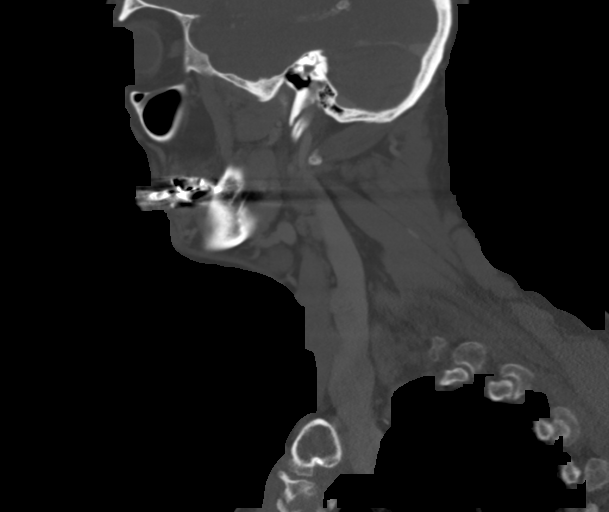
[im 42/101  bone]
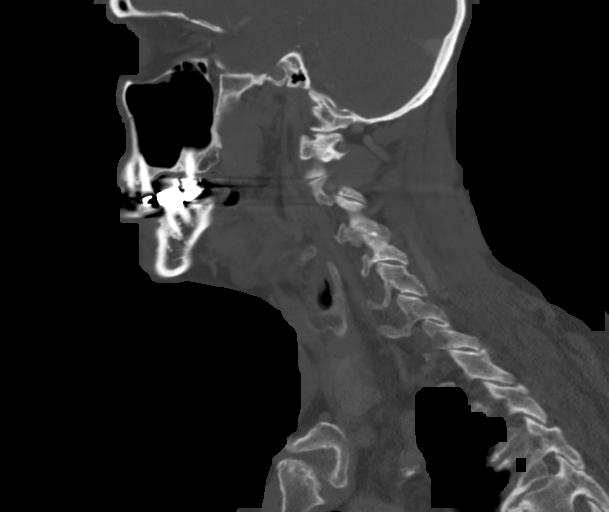
[im 51/101  soft-tissue]
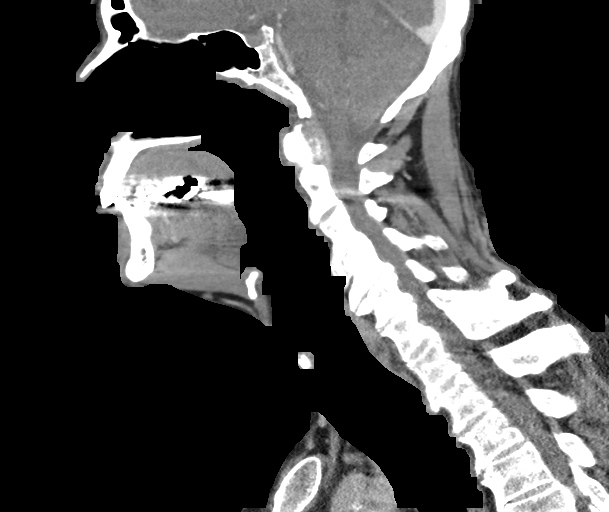
[im 51/101  bone]
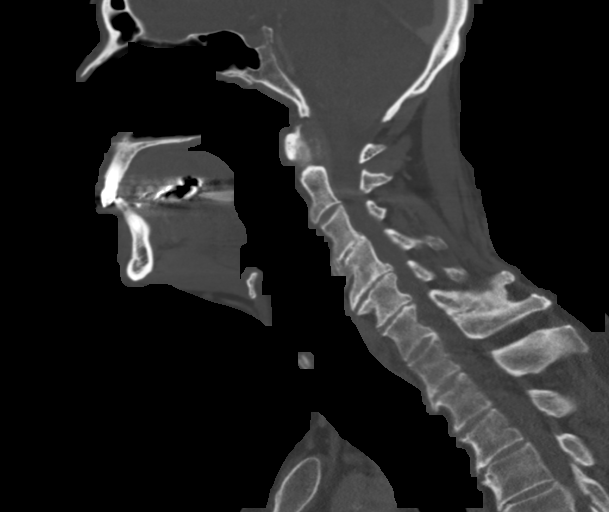
[im 59/101  bone]
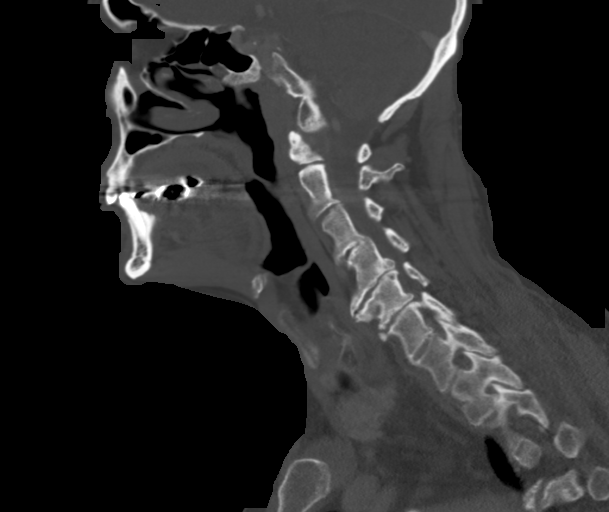
[im 67/101  bone]
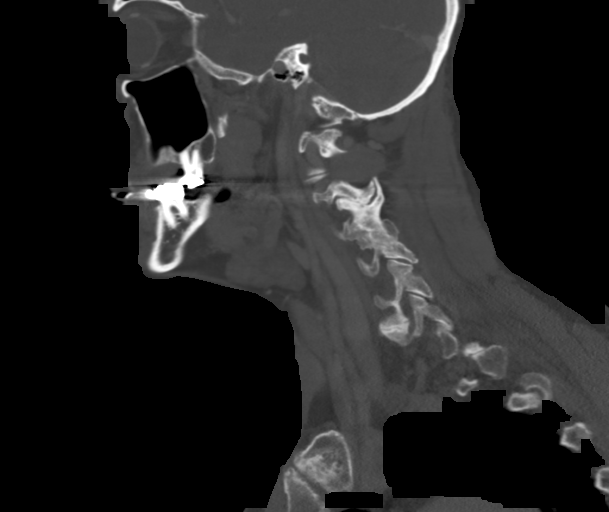

[Series 8: orthogonal ax · axial · 0.40mm/px · z∈[-125,-45]mm · 2 of 121 slices shown]
[im 41/121  bone]
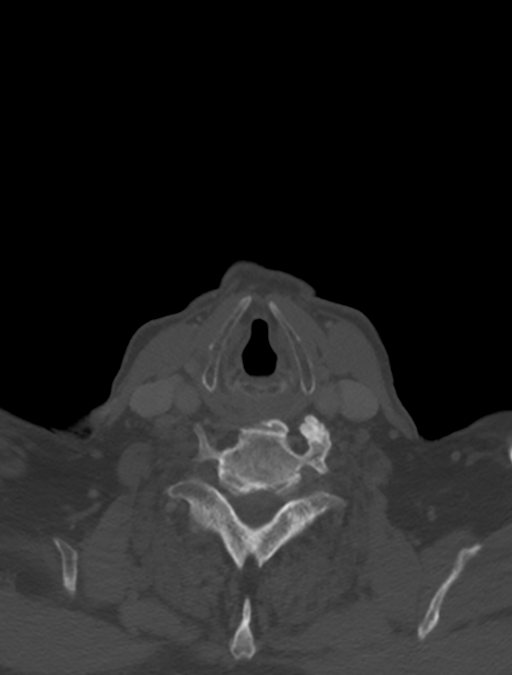
[im 81/121  bone]
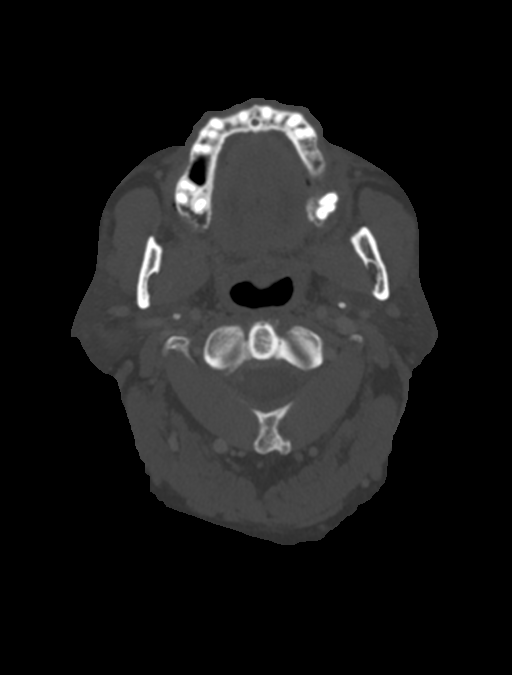

[Series 9: axial neck (person_name) (person_name) · axial · 0.48mm/px · z∈[-128,-48]mm · 2 of 122 slices shown]
[im 41/122  bone]
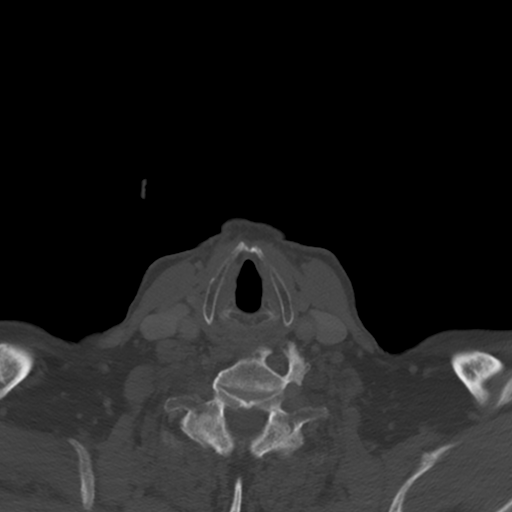
[im 81/122  bone]
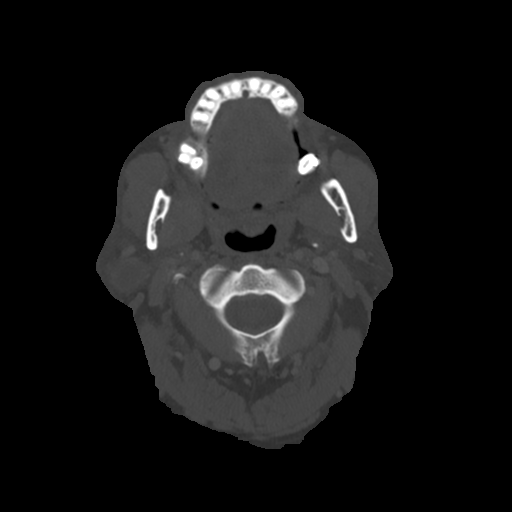

[14 of 33 positions shown; findings below may reference images not displayed]

FINDINGS: Pharynx and larynx: No focal mucosal or submucosal lesions are
present. The nasopharynx is clear. Soft palate is unremarkable. The
tongue base and vallecula are within normal limits. Epiglottis is
unremarkable. The hypopharynx is clear. The vocal cords are midline
and symmetric.

Salivary glands: A peripherally enhancing mass lesion in the
posterior right parotid measures 2.3 x 3.0 x 2.4 cm. This lesion has
increased in size since 7344 at which time it was thought to be a
cervical lymph node. The lesion does appear to be intraparotid and
has increased from 2.0 to 2.4 cm in transverse diameter. No
contralateral lesions are present in the left parotid gland. The
lesion is in the posterior superficial lobe of the right parotid.
The submandibular glands are within normal limits bilaterally.

Thyroid: Normal.

Lymph nodes: No significant cervical adenopathy is present.

Vascular: Atherosclerotic changes are present at the carotid
bifurcations bilaterally without significant stenosis. Additional
calcifications are present at the aortic arch and origin of the
great vessels.

Limited intracranial: Unremarkable

Visualized orbits: Scleral banding is present on the left. Bilateral
lens replacements are present. Globes and orbits are otherwise
within normal limits.

Mastoids and visualized paranasal sinuses: The paranasal sinuses and
mastoid air cells are clear.

: There is ankylosis across the disc space at C3-4. Endplate change
in uncovertebral spurring is most pronounced at C4-5 and C5-6 with
osseous foraminal narrowing bilaterally. Posterior fusion across the
facets is present at C6-7. No focal lytic or blastic lesions are
present.

Upper chest: The lung apices demonstrate centrilobular emphysema.
Mild dependent atelectasis is present. Thoracic inlet is within
normal limits.
IMPRESSION: 1. Enlarging peripherally enhancing posterior right parotid lesion
now measures 2.3 x 3.0 x 2.4 cm. Given the slow growth, this is
likely a benign neoplasm such as a benign mixed tumor. Warthin's
tumor is also considered.
2. No aggressive features or evidence of adenopathy.
3.  Aortic Atherosclerosis (LFEFB-BL1.1).
4.  Emphysema ([id number]).
5. Multilevel degenerative changes in the cervical spine as
described. Osseous foraminal narrowing is greatest at C4-5 and C5-6.

## 2018-10-13 DIAGNOSIS — K5730 Diverticulosis of large intestine without perforation or abscess without bleeding: Secondary | ICD-10-CM | POA: Diagnosis not present

## 2018-10-13 DIAGNOSIS — D124 Benign neoplasm of descending colon: Secondary | ICD-10-CM | POA: Diagnosis not present

## 2018-10-13 DIAGNOSIS — D125 Benign neoplasm of sigmoid colon: Secondary | ICD-10-CM | POA: Diagnosis not present

## 2018-10-13 DIAGNOSIS — D120 Benign neoplasm of cecum: Secondary | ICD-10-CM | POA: Diagnosis not present

## 2018-10-13 DIAGNOSIS — D122 Benign neoplasm of ascending colon: Secondary | ICD-10-CM | POA: Diagnosis not present

## 2018-10-13 DIAGNOSIS — K648 Other hemorrhoids: Secondary | ICD-10-CM | POA: Diagnosis not present

## 2018-10-13 DIAGNOSIS — Z86010 Personal history of colonic polyps: Secondary | ICD-10-CM | POA: Diagnosis not present

## 2018-10-13 DIAGNOSIS — K635 Polyp of colon: Secondary | ICD-10-CM | POA: Diagnosis not present

## 2018-10-14 ENCOUNTER — Telehealth (HOSPITAL_COMMUNITY): Payer: Self-pay | Admitting: Occupational Therapy

## 2018-10-14 ENCOUNTER — Encounter (HOSPITAL_COMMUNITY): Payer: Self-pay | Admitting: Occupational Therapy

## 2018-10-14 NOTE — Telephone Encounter (Signed)
L/m for Magda Paganini to call them back at the home number please.

## 2018-10-14 NOTE — Therapy (Signed)
[place], [place], [address] Phone: [phone]   Fax:  [phone]  Patient Details  Name: Jerome Mays MRN: [id number] Date of Birth: 1940-09-20 Referring Provider:  No ref. provider found  Encounter Date: 10/14/2018   OCCUPATIONAL THERAPY DISCHARGE SUMMARY  Visits from Start of Care: 6  Current functional level related to goals / functional outcomes: Unknown-pt's last appointment was on 08/18/2018 due to COVID closure. Spoke with pt via telephone who reports his shoulder has been improving with HEP completion. He is pleased with his current functioning and would like to be discharged at this time. He is aware he will need a new MD referral to return for additional therapy services.    Remaining deficits: Decreased ROM, stiffness, decreased strength   Education / Equipment: HEPs for shoulder ROM, stretching Plan: Patient agrees to discharge.  Patient goals were partially met. Patient is being discharged due to being pleased with the current functional level.  ?????      Guadelupe Sabin, OTR/L  [phone] 10/14/2018, 3:28 PM  [facility] [address] [place], [place], [address] Phone: [phone]   Fax:  [phone]

## 2018-11-07 DIAGNOSIS — Z4789 Encounter for other orthopedic aftercare: Secondary | ICD-10-CM | POA: Diagnosis not present

## 2018-11-09 DIAGNOSIS — L570 Actinic keratosis: Secondary | ICD-10-CM | POA: Diagnosis not present

## 2018-11-09 DIAGNOSIS — L821 Other seborrheic keratosis: Secondary | ICD-10-CM | POA: Diagnosis not present

## 2018-11-09 DIAGNOSIS — L280 Lichen simplex chronicus: Secondary | ICD-10-CM | POA: Diagnosis not present

## 2018-12-19 ENCOUNTER — Ambulatory Visit (INDEPENDENT_AMBULATORY_CARE_PROVIDER_SITE_OTHER): Payer: Medicare HMO | Admitting: Otolaryngology

## 2018-12-19 DIAGNOSIS — H6123 Impacted cerumen, bilateral: Secondary | ICD-10-CM

## 2018-12-19 DIAGNOSIS — H6981 Other specified disorders of Eustachian tube, right ear: Secondary | ICD-10-CM

## 2018-12-19 DIAGNOSIS — K114 Fistula of salivary gland: Secondary | ICD-10-CM | POA: Diagnosis not present

## 2018-12-19 NOTE — Progress Notes (Addendum)
[facility] Note  12/20/2018     CHIEF COMPLAINT Patient presents for Retina Follow Up   HISTORY OF PRESENT ILLNESS: Jerome Mays is a 78 y.o. male who presents to the clinic today for:   HPI    Retina Follow Up    Patient presents with  Retinal Break/Detachment.  In left eye.  Since onset it is stable.  I, the attending physician,  performed the HPI with the patient and updated documentation appropriately.          Comments    Patient states vision in his left eye is still very poor--when playing golf, he covers his left eye to see the ball because otherwise his depth perception is limited and sometimes sees two golf balls with both eyes open.  Patient denies eye pain or discomfort and denies any new or worsening floaters or fol OU.       Last edited by [name], MD on 12/20/2018  1:48 PM. (History)    pt states he had a shoulder sx in December and a jaw sx in January, he states the knot on his jaw was non-cancerous, he is doing physical therapy for his shoulder, he states he is still using PF and ketorolac TID OS, he states his vision is the same as last visit  Referring physician: [name], MD [place],  [place] [address]  HISTORICAL INFORMATION:   Selected notes from the Rew Initial referral from Dr. [name] for RD OS s/p SBP (42 band) + 25g PPV/PFC/EL/FAX/SO OS 9.12.18 s/p PPV w/ membrane peel/relaxing retinectomy/PFC/SOX OS under GA, 10.15.18   CURRENT MEDICATIONS: Current Outpatient Medications (Ophthalmic Drugs)  Medication Sig  . ketorolac (ACULAR) 0.5 % ophthalmic solution Place 1 drop into the left eye 4 (four) times daily. (Patient not taking: Reported on 08/01/2018)  . Polyethyl Glycol-Propyl Glycol (SYSTANE OP) Place 1 drop into both eyes as needed (for dry eyes).  . prednisoLONE acetate (PRED FORTE) 1 % ophthalmic suspension Place 1 drop into the left eye 4 (four) times daily. (Patient not  taking: Reported on 08/01/2018)  . sodium chloride (MURO 128) 5 % ophthalmic solution Place 1 drop into the left eye 2 (two) times a day.   No current facility-administered medications for this visit.  (Ophthalmic Drugs)   Current Outpatient Medications (Other)  Medication Sig  . allopurinol (ZYLOPRIM) 300 MG tablet Take 300 mg by mouth daily.   [name] aspirin EC 81 MG tablet Take 81 mg by mouth daily.  . cholecalciferol (VITAMIN D3) 25 MCG (1000 UT) tablet Take 1,000 Units by mouth daily.  . diphenhydramine-acetaminophen (TYLENOL PM) 25-500 MG TABS tablet Take 2 tablets by mouth at bedtime as needed (sleep).   . Fluticasone-Umeclidin-Vilant (TRELEGY ELLIPTA) 100-62.5-25 MCG/INH AEPB Take 1 puff by mouth daily.  . magnesium 30 MG tablet Take 30 mg by mouth 2 (two) times daily.  . Multiple Vitamin (ONE-A-DAY MENS PO) Take 1 tablet by mouth daily.  [name] oxyCODONE-acetaminophen (PERCOCET) 5-325 MG tablet Take 1-2 tablets by mouth every 4 (four) hours as needed for severe pain. (Patient not taking: Reported on 08/01/2018)   No current facility-administered medications for this visit.  (Other)      REVIEW OF SYSTEMS: ROS    Positive for: Cardiovascular, Eyes   Negative for: Constitutional, Gastrointestinal, Neurological, Skin, Genitourinary, Musculoskeletal, HENT, Endocrine, Respiratory, Psychiatric, Allergic/Imm, Heme/Lymph   Last edited by Doneen Poisson on 12/20/2018  8:43 AM. (History)  ALLERGIES Allergies  Allergen Reactions  . Lipitor [Atorvastatin] Rash and Other (See Comments)    Passed out    PAST MEDICAL HISTORY Past Medical History:  Diagnosis Date  . Anemia    low iron  . Arthritis   . Colonic polyp   . Constipation   . COPD (chronic obstructive pulmonary disease) ([place])   . GERD (gastroesophageal reflux disease)   . Gout   . History of kidney stones   . Malignant tumor of kidney ([place])   . Pain    LOWER BACK AND LEFT HIP - HX OF PREVIOUS LUMBAR AND CERVICAL  SURGERY--PT STATES HE HAS HAD NUMBNESS IN BOTH FEET SINCE HIS BACK SURGERY  . Renal calculus   . Retinal detachment    Past Surgical History:  Procedure Laterality Date  . BACK SURGERY     LUMBAR SURGERY  . CATARACT EXTRACTION Bilateral 2003   Surgeon unknown  . EYE SURGERY     BILATERAL CATARACT EXTRACTIONS  . GIVENS CAPSULE STUDY N/A 10/07/2015   Procedure: GIVENS CAPSULE STUDY;  Surgeon: Carol Ada, MD;  Location: [facility];  Service: Endoscopy;  Laterality: N/A;  . HAND SURGERY     left  . KNEE SURGERY     right  . LAMINECTOMY     cervical  . LASER PHOTO ABLATION Left 08/19/2017   Procedure: LASER PHOTO ABLATION;  Surgeon: [name], MD;  Location: [facility];  Service: Ophthalmology;  Laterality: Left;  . MEMBRANE PEEL Left 03/22/2017   Procedure: POSSIBLE MEMBRANE PEEL WITH SILICONE OIL AND PERFLURON;  Surgeon: [name], MD;  Location: [facility];  Service: Ophthalmology;  Laterality: Left;  . NEPHRECTOMY     PT STATES RIGHT KIDNEY WAS REMOVED  . PAROTIDECTOMY Right 06/13/2018   Procedure: RIGHT TOTAL PAROTIDECTOMY;  Surgeon: Leta Baptist, MD;  Location: [facility];  Service: ENT;  Laterality: Right;  . PARS PLANA VITRECTOMY Left 03/22/2017   Procedure: PARS PLANA VITRECTOMY WITH 25 GAUGE;  Surgeon: [name], MD;  Location: [facility];  Service: Ophthalmology;  Laterality: Left;  . PARS PLANA VITRECTOMY W/ SCLERAL BUCKLE Left 08/19/2017   Procedure: 64 GAUGE PARS PLANA VITRECTOMY WITH SILCONE OIL REMOVAL;  Surgeon: [name], MD;  Location: [facility];  Service: Ophthalmology;  Laterality: Left;  . 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 TOTAL KNEE ARTHROPLASTY Left 10/19/2012   Procedure: LEFT TOTAL KNEE ARTHROPLASTY;  Surgeon: Magnus Sinning, MD;  Location: [facility];  Service: Orthopedics;  Laterality: Left;  [name] VITRECTOMY 25 GAUGE WITH SCLERAL BUCKLE Left 02/17/2017   Procedure: VITRECTOMY 25 GAUGE WITH SCLERAL BUCKLE membrane peel, injection of silicone oil, and  endolaser photocoagulation.;  Surgeon: [name], MD;  Location: [facility];  Service: Ophthalmology;  Laterality: Left;    FAMILY HISTORY Family History  Problem Relation Age of Onset  . Diabetes Mother   . COPD Brother   . COPD Sister   . Amblyopia Neg Hx   . Blindness Neg Hx   . Cataracts Neg Hx   . Glaucoma Neg Hx   . Macular degeneration Neg Hx   . Retinal detachment Neg Hx   . Retinitis pigmentosa Neg Hx     SOCIAL HISTORY Social History   Tobacco Use  . Smoking status: Former Smoker    Packs/day: 2.00    Years: 40.00    Pack years: 80.00    Types: Cigarettes, Pipe    Quit date: 06/09/1995    Years since quitting: 23.5  .  Smokeless tobacco: Never Used  Substance Use Topics  . Alcohol use: No  . Drug use: No         OPHTHALMIC EXAM:  Base Eye Exam    Visual Acuity (Snellen - Linear)      Right Left   Dist cc 20/20 -2 20/80 -3   Dist ph cc  NI   Correction: Glasses       Tonometry (Tonopen, 8:48 AM)      Right Left   Pressure 17 19       Pupils      Dark Light Shape React APD   Right 3 2 Round Brisk 0   Left 5 4 Round Minimal 0       Visual Fields      Left Right     Full       Extraocular Movement      Right Left    Full Full       Neuro/Psych    Oriented x3: Yes   Mood/Affect: Normal       Dilation    Both eyes: 1.0% Mydriacyl, 2.5% Phenylephrine @ 8:48 AM        Slit Lamp and Fundus Exam    External Exam      Right Left   External Normal Periorbital edema improving       Slit Lamp Exam      Right Left   Lids/Lashes Dermatochalasis - upper lid Dermatochalasis - upper lid   Conjunctiva/Sclera White and quiet White and quiet   Cornea Arcus, trace Punctate epithelial erosions 1+ Descemet's folds, Arcus, trace Punctate epithelial erosions   Anterior Chamber Deep and quiet Deep, 1/2+ pigment   Iris Round and dilated Round and moderately dilated   Lens Three piece Posterior chamber intraocular lens Three piece Posterior chamber  intraocular lens in good position, micro condensations on posterior lens surface - stable   Vitreous Vitreous syneresis Post vitrectomy       Fundus Exam      Right Left   Disc Pink and Sharp, mild Peripapillary atrophy Sharp rim, mild pallor, temporal Peripapillary atrophy, +cupping   C/D Ratio 0.4 0.6   Macula Flat, good foveal reflex, Retinal pigment epithelial mottling - mild, No heme or edema flat; Blunted foveal reflex, mild cystic changes temporal macula   Vessels Vascular attenuation, Tortuous Vascular attenuation   Periphery Attached, mild Reticular degeneration; no RT or RD Attached, moderate buckle height; temporal/inf retinectomy from 2-630 with peripheral edge attached with good laser at the edge; trace areas of fibrosis inferiorly and inf temporal -- surrounded by good laser        Refraction    Wearing Rx      Sphere Cylinder Axis Add   Right -0.75 +1.00 151 +2.50   Left -0.50 +1.00 020 +2.50   Type: PAL       Manifest Refraction      Sphere Cylinder Axis Dist VA   Right -0.25 +1.25 145 20/20-1   Left -1.25 +0.75 180 NI          IMAGING AND PROCEDURES  Imaging and Procedures for 09/19/17  OCT, Retina - OU - Both Eyes       Right Eye Quality was good. Central Foveal Thickness: 248. Progression has been stable. Findings include normal foveal contour, no IRF, no SRF.   Left Eye Quality was good. Central Foveal Thickness: 376. Progression has worsened. Findings include no SRF, epiretinal membrane, intraretinal fluid, abnormal foveal contour (Patchy  ORA, interval increase in IRF).   Notes Images taken, stored on drive  Diagnosis / Impression:  OD: NFP, No IRF, No SRF OS: retina attached with oil out - interval increase in IRF  Clinical management:  See below  Abbreviations: NFP - Normal foveal profile. CME - cystoid macular edema. PED - pigment epithelial detachment. IRF - intraretinal fluid. SRF - subretinal fluid. EZ - ellipsoid zone. ERM - epiretinal  membrane. ORA - outer retinal atrophy. ORT - outer retinal tubulation. SRHM - subretinal hyper-reflective material         Injection into Tenon's Capsule - OS - Left Eye       Time Out 12/20/2018. 10:09 AM. Confirmed correct patient, procedure, site, and patient consented.   Anesthesia Topical anesthesia was used. Anesthetic medications included Proparacaine 0.5%, Lidocaine 2%.   Procedure Preparation included 5% betadine to ocular surface, eyelid speculum. A 27 gauge needle was used.   Injection:  4 mg KENALOG-46m/ml injection 487m  NDC: 004166-0630-16Lot: [id number] date: 07/29/2019   Route: Other, Site: Left Eye  Post-op Post injection exam found visual acuity of at least counting fingers. The patient tolerated the procedure well. There were no complications. The patient received written and verbal post procedure care education.   Notes 0.7 cc of Kenalog-40 (28 mg) injected into subtenon's capsule in the superotemporal quadrant. Betadine was applied to Injection area pre and post-injection then rinsed with sterile BSS. 1 drop of polymixin was instilled into the eye. There were no complications. Pt tolerated procedure well.                 ASSESSMENT/PLAN:    ICD-10-CM   1. 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 Cystoid macular edema of left eye  H35.352 Injection into Tenon's Capsule - OS - Left Eye    triamcinolone acetonide (KENALOG-40) injection 4 mg  4. Retinal edema  H35.81 OCT, Retina - OU - Both Eyes    Injection into Tenon's Capsule - OS - Left Eye    triamcinolone acetonide (KENALOG-40) injection 4 mg  5. Pseudophakia, both eyes  Z96.1   6. Corneal edema of left eye  H18.20   7. Pseudophakia of left eye  Z96.1     1-4. 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  - inferior detachment from 2-10 oclock with HST at ~530  - fovea off, but sup nasal macula attached  - by history RD  likely started 4-5 wks prior to initial presentation, but fovea became involved ~Saturday, 02/13/17  - s/p SBP (42 band) + 25g PPV/PFC/EL/FAX/SO OS 9.12.18  - extensive PVR along inf temp arcades with tractional redetachment inf temp noted POM1  - s/p PPV w/ membrane peel/relaxing retinectomy/PFC/SOX OS under GA, 10.15.18  - s/p PPV/SOR/EL OS, 3.14.19  - has been lost to f/u since 12.19.19 due to shoulder and parotid surgeries and COVID-19 restrictions  - currently on PF and Ketorolac TID OS for CME  - mild persistent corneal edema -- recommend starting Muro 128 BID  - retina attached and in good position -- interval increase in IRF  - recommend STK today, 07.14.20, due to increase in IRF  - pt wishes to proceed  - RBA of procedure discussed, questions answered  - informed consent obtained and signed  - see procedure note  - increase PF QID OS and ketorolac QID OS  - IOP 19 today -- good off Alphagan P  -  cont PSO ung PRN OS  - f/u 4-6 weeks for K and CME check  5. Pseudophakia OU  - IOLs in good position OU  - monitor  6. Corneal edema OS  - persistent post op corneal edema OS as above  - starting Muro 128 BID as above  - will refer to cornea specialist for evaluation and management if continues to persist  Ophthalmic Meds Ordered this visit:  Meds ordered this encounter  Medications  . sodium chloride (MURO 128) 5 % ophthalmic solution    Sig: Place 1 drop into the left eye 2 (two) times a day.    Dispense:  15 mL    Refill:  5  . triamcinolone acetonide (KENALOG-40) injection 4 mg       Return for f/u 4-6 weeks, CME OS, DFE, OCT.  There are no Patient Instructions on file for this visit.   Explained the diagnoses, plan, and follow up with the patient and they expressed understanding.  Patient expressed understanding of the importance of proper follow up care.  This document serves as a record of services personally performed by Gardiner Sleeper, MD, PhD. It was created  on their behalf by Ernest Mallick, OA, an ophthalmic assistant. The creation of this record is the provider's dictation and/or activities during the visit.    Electronically signed by: Ernest Mallick, OA  07.13.2020 1:54 PM    Gardiner Sleeper, M.D., Ph.D. Diseases & Surgery of the Retina and Vitreous [facility]  I have reviewed the above documentation for accuracy and completeness, and I agree with the above. Gardiner Sleeper, M.D., Ph.D. 12/20/18 1:54 PM   Abbreviations: M myopia (nearsighted); A astigmatism; H hyperopia (farsighted); P presbyopia; Mrx spectacle prescription;  CTL contact lenses; OD right eye; OS left eye; OU both eyes  XT exotropia; ET esotropia; PEK punctate epithelial keratitis; PEE punctate epithelial erosions; DES dry eye syndrome; MGD meibomian gland dysfunction; ATs artificial tears; PFAT's preservative free artificial tears; [place] nuclear sclerotic cataract; PSC posterior subcapsular cataract; ERM epi-retinal membrane; PVD posterior vitreous detachment; RD retinal detachment; DM diabetes mellitus; DR diabetic retinopathy; NPDR non-proliferative diabetic retinopathy; PDR proliferative diabetic retinopathy; CSME clinically significant macular edema; DME diabetic macular edema; dbh dot blot hemorrhages; CWS cotton wool spot; POAG primary open angle glaucoma; C/D cup-to-disc ratio; HVF humphrey visual field; GVF goldmann visual field; OCT optical coherence tomography; IOP intraocular pressure; BRVO Branch retinal vein occlusion; CRVO central retinal vein occlusion; CRAO central retinal artery occlusion; BRAO branch retinal artery occlusion; RT retinal tear; SB scleral buckle; PPV pars plana vitrectomy; VH Vitreous hemorrhage; PRP panretinal laser photocoagulation; IVK intravitreal kenalog; VMT vitreomacular traction; MH Macular hole;  NVD neovascularization of the disc; NVE neovascularization elsewhere; AREDS age related eye disease study; ARMD age related macular  degeneration; POAG primary open angle glaucoma; EBMD epithelial/anterior basement membrane dystrophy; ACIOL anterior chamber intraocular lens; IOL intraocular lens; PCIOL posterior chamber intraocular lens; Phaco/IOL phacoemulsification with intraocular lens placement; [place] photorefractive keratectomy; LASIK laser assisted in situ keratomileusis; HTN hypertension; DM diabetes mellitus; COPD chronic obstructive pulmonary disease

## 2018-12-20 ENCOUNTER — Other Ambulatory Visit: Payer: Self-pay

## 2018-12-20 ENCOUNTER — Ambulatory Visit (INDEPENDENT_AMBULATORY_CARE_PROVIDER_SITE_OTHER): Payer: Medicare HMO | Admitting: Ophthalmology

## 2018-12-20 ENCOUNTER — Encounter (INDEPENDENT_AMBULATORY_CARE_PROVIDER_SITE_OTHER): Payer: Self-pay | Admitting: Ophthalmology

## 2018-12-20 DIAGNOSIS — H3522 Other non-diabetic proliferative retinopathy, left eye: Secondary | ICD-10-CM

## 2018-12-20 DIAGNOSIS — H3322 Serous retinal detachment, left eye: Secondary | ICD-10-CM | POA: Diagnosis not present

## 2018-12-20 DIAGNOSIS — Z961 Presence of intraocular lens: Secondary | ICD-10-CM

## 2018-12-20 DIAGNOSIS — H35352 Cystoid macular degeneration, left eye: Secondary | ICD-10-CM

## 2018-12-20 DIAGNOSIS — H3581 Retinal edema: Secondary | ICD-10-CM

## 2018-12-20 DIAGNOSIS — H1820 Unspecified corneal edema: Secondary | ICD-10-CM

## 2018-12-20 MED ORDER — SODIUM CHLORIDE (HYPERTONIC) 5 % OP SOLN: 1.0000 [drp] | Freq: Two times a day (BID) | OPHTHALMIC | 5 refills | Status: DC

## 2018-12-20 MED ORDER — TRIAMCINOLONE ACETONIDE 40 MG/ML IJ SUSP FOR KALEIDOSCOPE
4.0000 mg | INTRAMUSCULAR | Status: AC | PRN
  Administered 2018-12-20: 4 mg

## 2019-01-09 ENCOUNTER — Other Ambulatory Visit (INDEPENDENT_AMBULATORY_CARE_PROVIDER_SITE_OTHER): Payer: Self-pay | Admitting: Ophthalmology

## 2019-01-17 ENCOUNTER — Other Ambulatory Visit: Payer: Self-pay | Admitting: Otolaryngology

## 2019-01-17 NOTE — Progress Notes (Signed)
[facility] Note  01/18/2019     CHIEF COMPLAINT Patient presents for Retina Follow Up   HISTORY OF PRESENT ILLNESS: Jerome Mays is a 78 y.o. male who presents to the clinic today for:   HPI    Retina Follow Up    Patient presents with  Retinal Break/Detachment.  In left eye.  This started 9 months ago.  Since onset it is stable.  I, the attending physician,  performed the HPI with the patient and updated documentation appropriately.          Comments    F/U CME OS. Patient states vision is "the same, blurry", denies new visual onset/issues.Pt is using Muro 128 gtt's as instructed.       Last edited by [name], MD on 01/18/2019  9:33 AM. (History)    pt states he can't tell that much difference in his vision, he states he is able to tolerate  Referring physician: [name], MD [place],  [place] [id number]  HISTORICAL INFORMATION:   Selected notes from the [place] Initial referral from Dr. [name] for RD OS s/p SBP (42 band) + 25g PPV/PFC/EL/FAX/SO OS 9.12.18 s/p PPV w/ membrane peel/relaxing retinectomy/PFC/SOX OS under GA, 10.15.18   CURRENT MEDICATIONS: Current Outpatient Medications (Ophthalmic Drugs)  Medication Sig  . ketorolac (ACULAR) 0.5 % ophthalmic solution Place 1 drop into the left eye 4 (four) times daily. (Patient not taking: Reported on 08/01/2018)  . Polyethyl Glycol-Propyl Glycol (SYSTANE OP) Place 1 drop into both eyes as needed (for dry eyes).  . prednisoLONE acetate (PRED FORTE) 1 % ophthalmic suspension INSTILL 1 DROP INTO LEFT EYE 4 TIMES DAILY  . sodium chloride (MURO 128) 5 % ophthalmic solution Place 1 drop into the left eye 2 (two) times a day.   No current facility-administered medications for this visit.  (Ophthalmic Drugs)   Current Outpatient Medications (Other)  Medication Sig  . allopurinol (ZYLOPRIM) 300 MG tablet Take 300 mg by mouth daily.   [name] aspirin EC 81 MG  tablet Take 81 mg by mouth daily.  . cholecalciferol (VITAMIN D3) 25 MCG (1000 UT) tablet Take 1,000 Units by mouth daily.  . diphenhydramine-acetaminophen (TYLENOL PM) 25-500 MG TABS tablet Take 2 tablets by mouth at bedtime as needed (sleep).   . Fluticasone-Umeclidin-Vilant (TRELEGY ELLIPTA) 100-62.5-25 MCG/INH AEPB Take 1 puff by mouth daily.  . magnesium 30 MG tablet Take 30 mg by mouth 2 (two) times daily.  . Multiple Vitamin (ONE-A-DAY MENS PO) Take 1 tablet by mouth daily.  [name] oxyCODONE-acetaminophen (PERCOCET) 5-325 MG tablet Take 1-2 tablets by mouth every 4 (four) hours as needed for severe pain. (Patient not taking: Reported on 08/01/2018)   No current facility-administered medications for this visit.  (Other)      REVIEW OF SYSTEMS: ROS    Positive for: Eyes   Negative for: Constitutional, Gastrointestinal, Neurological, Skin, Genitourinary, Musculoskeletal, HENT, Endocrine, Cardiovascular, Respiratory, Psychiatric, Allergic/Imm, Heme/Lymph   Last edited by Zenovia Jordan, LPN on 09/04/5186  4:16 AM. (History)       ALLERGIES Allergies  Allergen Reactions  . Lipitor [Atorvastatin] Rash and Other (See Comments)    Passed out    PAST MEDICAL HISTORY Past Medical History:  Diagnosis Date  . Anemia    low iron  . Arthritis   . Colonic polyp   . Constipation   . COPD (chronic obstructive pulmonary disease) ([place])   . GERD (gastroesophageal reflux disease)   .  Gout   . History of kidney stones   . Malignant tumor of kidney ([place])   . Pain    LOWER BACK AND LEFT HIP - HX OF PREVIOUS LUMBAR AND CERVICAL SURGERY--PT STATES HE HAS HAD NUMBNESS IN BOTH FEET SINCE HIS BACK SURGERY  . Renal calculus   . Retinal detachment    Past Surgical History:  Procedure Laterality Date  . BACK SURGERY     LUMBAR SURGERY  . CATARACT EXTRACTION Bilateral 2003   Surgeon unknown  . EYE SURGERY     BILATERAL CATARACT EXTRACTIONS  . GIVENS CAPSULE STUDY N/A 10/07/2015   Procedure:  GIVENS CAPSULE STUDY;  Surgeon: Carol Ada, MD;  Location: [place];  Service: Endoscopy;  Laterality: N/A;  . HAND SURGERY     left  . KNEE SURGERY     right  . LAMINECTOMY     cervical  . LASER PHOTO ABLATION Left 08/19/2017   Procedure: LASER PHOTO ABLATION;  Surgeon: [name], MD;  Location: [place];  Service: Ophthalmology;  Laterality: Left;  . MEMBRANE PEEL Left 03/22/2017   Procedure: POSSIBLE MEMBRANE PEEL WITH SILICONE OIL AND PERFLURON;  Surgeon: [name], MD;  Location: [place];  Service: Ophthalmology;  Laterality: Left;  . NEPHRECTOMY     PT STATES RIGHT KIDNEY WAS REMOVED  . PAROTIDECTOMY Right 06/13/2018   Procedure: RIGHT TOTAL PAROTIDECTOMY;  Surgeon: Leta Baptist, MD;  Location: [place];  Service: ENT;  Laterality: Right;  . PARS PLANA VITRECTOMY Left 03/22/2017   Procedure: PARS PLANA VITRECTOMY WITH 25 GAUGE;  Surgeon: [name], MD;  Location: [facility];  Service: Ophthalmology;  Laterality: Left;  . PARS PLANA VITRECTOMY W/ SCLERAL BUCKLE Left 08/19/2017   Procedure: 49 GAUGE PARS PLANA VITRECTOMY WITH SILCONE OIL REMOVAL;  Surgeon: [name], MD;  Location: [place];  Service: Ophthalmology;  Laterality: Left;  . 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 TOTAL KNEE ARTHROPLASTY Left 10/19/2012   Procedure: LEFT TOTAL KNEE ARTHROPLASTY;  Surgeon: Magnus Sinning, MD;  Location: [facility];  Service: Orthopedics;  Laterality: Left;  [name] VITRECTOMY 25 GAUGE WITH SCLERAL BUCKLE Left 02/17/2017   Procedure: VITRECTOMY 25 GAUGE WITH SCLERAL BUCKLE membrane peel, injection of silicone oil, and endolaser photocoagulation.;  Surgeon: [name], MD;  Location: [facility];  Service: Ophthalmology;  Laterality: Left;    FAMILY HISTORY Family History  Problem Relation Age of Onset  . Diabetes Mother   . COPD Brother   . COPD Sister   . Amblyopia Neg Hx   . Blindness Neg Hx   . Cataracts Neg Hx   . Glaucoma Neg Hx   . Macular degeneration Neg Hx   . Retinal  detachment Neg Hx   . Retinitis pigmentosa Neg Hx     SOCIAL HISTORY Social History   Tobacco Use  . Smoking status: Former Smoker    Packs/day: 2.00    Years: 40.00    Pack years: 80.00    Types: Cigarettes, Pipe    Quit date: 06/09/1995    Years since quitting: 23.6  . Smokeless tobacco: Never Used  Substance Use Topics  . Alcohol use: No  . Drug use: No         OPHTHALMIC EXAM:  Base Eye Exam    Visual Acuity (Snellen - Linear)      Right Left   Dist cc 20/30 -2 20/80 +1   Dist ph cc 20/25 20/70 -2       Tonometry (  Tonopen, 8:58 AM)      Right Left   Pressure 15 17       Pupils      Dark Light Shape React APD   Right 3 2 Round Brisk None   Left 3 2 Round Slow None       Visual Fields      Left Right    Full Full       Extraocular Movement      Right Left    Full, Ortho Full, Ortho       Neuro/Psych    Oriented x3: Yes   Mood/Affect: Normal       Dilation    Both eyes: 1.0% Mydriacyl, 2.5% Phenylephrine @ 8:53 AM        Slit Lamp and Fundus Exam    External Exam      Right Left   External Normal Periorbital edema improving       Slit Lamp Exam      Right Left   Lids/Lashes Dermatochalasis - upper lid Dermatochalasis - upper lid   Conjunctiva/Sclera White and quiet STK ST quad   Cornea Arcus, trace Punctate epithelial erosions 1+ Descemet's folds, Arcus, trace Punctate epithelial erosions   Anterior Chamber Deep and quiet Deep, 1/2+ pigment   Iris Round and dilated Round and moderately dilated   Lens Three piece Posterior chamber intraocular lens Three piece Posterior chamber intraocular lens in good position, micro condensations on posterior lens surface - stable   Vitreous Vitreous syneresis Post vitrectomy       Fundus Exam      Right Left   Disc Pink and Sharp, mild Peripapillary atrophy Sharp rim, mild pallor, temporal Peripapillary atrophy, +cupping   C/D Ratio 0.4 0.6   Macula Flat, good foveal reflex, Retinal pigment  epithelial mottling - mild, No heme or edema  flat; Blunted foveal reflex, mild cystic changes temporal macula - improved   Vessels Vascular attenuation, Tortuous Vascular attenuation   Periphery Attached, mild Reticular degeneration; no RT or RD Attached, moderate buckle height; temporal/inf retinectomy from 2-630 with peripheral edge attached with good laser at the edge; trace areas of fibrosis inferiorly and inf temporal -- surrounded by good laser          IMAGING AND PROCEDURES  Imaging and Procedures for 09/19/17  OCT, Retina - OU - Both Eyes       Right Eye Quality was good. Central Foveal Thickness: 253. Progression has been stable. Findings include normal foveal contour, no IRF, no SRF.   Left Eye Quality was good. Central Foveal Thickness: 265. Progression has improved. Findings include no SRF, epiretinal membrane, intraretinal fluid, abnormal foveal contour (Patchy ORA, interval improvement IRF/CME).   Notes Images taken, stored on drive  Diagnosis / Impression:  OD: NFP, No IRF, No SRF OS: retina attached with oil out - interval improvement in IRF/CME  Clinical management:  See below  Abbreviations: NFP - Normal foveal profile. CME - cystoid macular edema. PED - pigment epithelial detachment. IRF - intraretinal fluid. SRF - subretinal fluid. EZ - ellipsoid zone. ERM - epiretinal membrane. ORA - outer retinal atrophy. ORT - outer retinal tubulation. SRHM - subretinal hyper-reflective material                  ASSESSMENT/PLAN:    ICD-10-CM   1. 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 Cystoid macular edema of left eye  H35.352  4. Retinal edema  H35.81 OCT, Retina - OU - Both Eyes  5. Pseudophakia, both eyes  Z96.1   6. Corneal edema of left eye  H18.20     1-4. 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  - inferior detachment from 2-10 oclock with HST at ~530  - fovea off, but sup nasal  macula attached  - by history RD likely started 4-5 wks prior to initial presentation, but fovea became involved ~Saturday, 02/13/17  - s/p SBP (42 band) + 25g PPV/PFC/EL/FAX/SO OS 9.12.18  - extensive PVR along inf temp arcades with tractional redetachment inf temp noted POM1  - s/p PPV w/ membrane peel/relaxing retinectomy/PFC/SOX OS under GA, 10.15.18  - s/p PPV/SOR/EL OS, 3.14.19  - lost to f/u from 12.19.19 to 7.14.20 due to shoulder and parotid surgeries and COVID-19 restrictions  - retina attached and in good position  - s/p STK OS #1 (07.14.20) for CME  - currently on PF and Ketorolac QID OS for CME  - OCT today shows interval improvement in IRF/CME OS  - mild persistent corneal edema -- continue Muro 128 BID OS  - decrease PF and ketorolac to TID OS for 3 weeks, then BID until next appt  - IOP 17 today -- good off Alphagan P  - cont PSO ung PRN OS  - f/u 6-8 weeks for K and CME check  5. Pseudophakia OU  - IOLs in good position OU  - monitor  6. Corneal edema OS  - persistent post op corneal edema OS as above  - cont Muro 128 BID as above  - will refer to cornea specialist for evaluation and management if continues to persist  Ophthalmic Meds Ordered this visit:  No orders of the defined types were placed in this encounter.      Return for f/u 6-8 weeks, CME OS, DFE, OCT.  There are no Patient Instructions on file for this visit.   Explained the diagnoses, plan, and follow up with the patient and they expressed understanding.  Patient expressed understanding of the importance of proper follow up care.  This document serves as a record of services personally performed by Gardiner Sleeper, MD, PhD. It was created on their behalf by Ernest Mallick, OA, an ophthalmic assistant. The creation of this record is the provider's dictation and/or activities during the visit.    Electronically signed by: Ernest Mallick, OA  08.11.2020 12:41 PM     Gardiner Sleeper, M.D.,  Ph.D. Diseases & Surgery of the Retina and Vitreous [facility]  I have reviewed the above documentation for accuracy and completeness, and I agree with the above. Gardiner Sleeper, M.D., Ph.D. 01/18/19 12:41 PM    Abbreviations: M myopia (nearsighted); A astigmatism; H hyperopia (farsighted); P presbyopia; Mrx spectacle prescription;  CTL contact lenses; OD right eye; OS left eye; OU both eyes  XT exotropia; ET esotropia; PEK punctate epithelial keratitis; PEE punctate epithelial erosions; DES dry eye syndrome; MGD meibomian gland dysfunction; ATs artificial tears; PFAT's preservative free artificial tears; [place] nuclear sclerotic cataract; PSC posterior subcapsular cataract; ERM epi-retinal membrane; PVD posterior vitreous detachment; RD retinal detachment; DM diabetes mellitus; DR diabetic retinopathy; NPDR non-proliferative diabetic retinopathy; PDR proliferative diabetic retinopathy; CSME clinically significant macular edema; DME diabetic macular edema; dbh dot blot hemorrhages; CWS cotton wool spot; POAG primary open angle glaucoma; C/D cup-to-disc ratio; HVF humphrey visual field; GVF goldmann visual field; OCT optical coherence tomography; IOP intraocular pressure; BRVO Branch retinal  vein occlusion; CRVO central retinal vein occlusion; CRAO central retinal artery occlusion; BRAO branch retinal artery occlusion; RT retinal tear; SB scleral buckle; PPV pars plana vitrectomy; VH Vitreous hemorrhage; PRP panretinal laser photocoagulation; IVK intravitreal kenalog; VMT vitreomacular traction; MH Macular hole;  NVD neovascularization of the disc; NVE neovascularization elsewhere; AREDS age related eye disease study; ARMD age related macular degeneration; POAG primary open angle glaucoma; EBMD epithelial/anterior basement membrane dystrophy; ACIOL anterior chamber intraocular lens; IOL intraocular lens; PCIOL posterior chamber intraocular lens; Phaco/IOL phacoemulsification with  intraocular lens placement; [place] photorefractive keratectomy; LASIK laser assisted in situ keratomileusis; HTN hypertension; DM diabetes mellitus; COPD chronic obstructive pulmonary disease

## 2019-01-18 ENCOUNTER — Ambulatory Visit (INDEPENDENT_AMBULATORY_CARE_PROVIDER_SITE_OTHER): Payer: Medicare HMO | Admitting: Ophthalmology

## 2019-01-18 ENCOUNTER — Other Ambulatory Visit: Payer: Self-pay

## 2019-01-18 ENCOUNTER — Encounter (INDEPENDENT_AMBULATORY_CARE_PROVIDER_SITE_OTHER): Payer: Self-pay | Admitting: Ophthalmology

## 2019-01-18 DIAGNOSIS — H35352 Cystoid macular degeneration, left eye: Secondary | ICD-10-CM | POA: Diagnosis not present

## 2019-01-18 DIAGNOSIS — H1820 Unspecified corneal edema: Secondary | ICD-10-CM

## 2019-01-18 DIAGNOSIS — Z961 Presence of intraocular lens: Secondary | ICD-10-CM | POA: Diagnosis not present

## 2019-01-18 DIAGNOSIS — H3322 Serous retinal detachment, left eye: Secondary | ICD-10-CM

## 2019-01-18 DIAGNOSIS — H3522 Other non-diabetic proliferative retinopathy, left eye: Secondary | ICD-10-CM | POA: Diagnosis not present

## 2019-01-18 DIAGNOSIS — H3581 Retinal edema: Secondary | ICD-10-CM

## 2019-02-01 DIAGNOSIS — L570 Actinic keratosis: Secondary | ICD-10-CM | POA: Diagnosis not present

## 2019-02-03 ENCOUNTER — Other Ambulatory Visit: Payer: Self-pay | Admitting: Pulmonary Disease

## 2019-02-14 ENCOUNTER — Encounter (HOSPITAL_BASED_OUTPATIENT_CLINIC_OR_DEPARTMENT_OTHER): Payer: Self-pay | Admitting: *Deleted

## 2019-02-14 ENCOUNTER — Other Ambulatory Visit: Payer: Self-pay

## 2019-02-14 DIAGNOSIS — R69 Illness, unspecified: Secondary | ICD-10-CM | POA: Diagnosis not present

## 2019-02-16 DIAGNOSIS — M25512 Pain in left shoulder: Secondary | ICD-10-CM | POA: Diagnosis not present

## 2019-02-16 DIAGNOSIS — M25511 Pain in right shoulder: Secondary | ICD-10-CM | POA: Diagnosis not present

## 2019-02-17 ENCOUNTER — Other Ambulatory Visit: Payer: Self-pay

## 2019-02-17 ENCOUNTER — Other Ambulatory Visit (HOSPITAL_COMMUNITY)
Admission: RE | Admit: 2019-02-17 | Discharge: 2019-02-17 | Disposition: A | Discharge status: Home / Self Care | Payer: Medicare HMO | Source: Ambulatory Visit | Attending: Otolaryngology | Admitting: Otolaryngology

## 2019-02-17 ENCOUNTER — Encounter (HOSPITAL_BASED_OUTPATIENT_CLINIC_OR_DEPARTMENT_OTHER)
Admission: RE | Admit: 2019-02-17 | Discharge: 2019-02-17 | Disposition: A | Discharge status: Home / Self Care | Payer: Medicare HMO | Source: Ambulatory Visit | Attending: Otolaryngology | Admitting: Otolaryngology

## 2019-02-17 DIAGNOSIS — Z20828 Contact with and (suspected) exposure to other viral communicable diseases: Secondary | ICD-10-CM | POA: Insufficient documentation

## 2019-02-17 DIAGNOSIS — Z01812 Encounter for preprocedural laboratory examination: Secondary | ICD-10-CM | POA: Insufficient documentation

## 2019-02-17 LAB — BASIC METABOLIC PANEL
Anion gap: 7 (ref 5–15)
BUN: 20 mg/dL (ref 8–23)
CO2: 21 mmol/L — ABNORMAL LOW (ref 22–32)
Calcium: 9.4 mg/dL (ref 8.9–10.3)
Chloride: 110 mmol/L (ref 98–111)
Creatinine, Ser: 1.03 mg/dL (ref 0.61–1.24)
GFR calc Af Amer: >60 mL/min (ref >60)
GFR calc non Af Amer: >60 mL/min (ref >60)
Glucose, Bld: 106 mg/dL — ABNORMAL HIGH (ref 70–99)
Potassium: 4.2 mmol/L (ref 3.5–5.1)
Sodium: 138 mmol/L (ref 135–145)

## 2019-02-18 LAB — NOVEL CORONAVIRUS, NAA (HOSP ORDER, SEND-OUT TO REF LAB; TAT 18-24 HRS): SARS-CoV-2, NAA: NOT DETECTED

## 2019-02-21 ENCOUNTER — Ambulatory Visit (HOSPITAL_BASED_OUTPATIENT_CLINIC_OR_DEPARTMENT_OTHER): Payer: Medicare HMO | Admitting: Certified Registered"

## 2019-02-21 ENCOUNTER — Encounter (HOSPITAL_BASED_OUTPATIENT_CLINIC_OR_DEPARTMENT_OTHER)
Admission: RE | Disposition: A | Discharge status: Home / Self Care | Payer: Self-pay | Source: Home / Self Care | Attending: Otolaryngology

## 2019-02-21 ENCOUNTER — Encounter (HOSPITAL_BASED_OUTPATIENT_CLINIC_OR_DEPARTMENT_OTHER): Payer: Self-pay | Admitting: *Deleted

## 2019-02-21 ENCOUNTER — Other Ambulatory Visit: Payer: Self-pay

## 2019-02-21 ENCOUNTER — Ambulatory Visit (HOSPITAL_BASED_OUTPATIENT_CLINIC_OR_DEPARTMENT_OTHER)
Admission: RE | Admit: 2019-02-21 | Discharge: 2019-02-21 | Disposition: A | Discharge status: Home / Self Care | Payer: Medicare HMO | Attending: Otolaryngology | Admitting: Otolaryngology

## 2019-02-21 DIAGNOSIS — H6123 Impacted cerumen, bilateral: Secondary | ICD-10-CM | POA: Diagnosis not present

## 2019-02-21 DIAGNOSIS — J449 Chronic obstructive pulmonary disease, unspecified: Secondary | ICD-10-CM | POA: Insufficient documentation

## 2019-02-21 DIAGNOSIS — K114 Fistula of salivary gland: Secondary | ICD-10-CM | POA: Insufficient documentation

## 2019-02-21 DIAGNOSIS — Z87891 Personal history of nicotine dependence: Secondary | ICD-10-CM | POA: Insufficient documentation

## 2019-02-21 DIAGNOSIS — H6981 Other specified disorders of Eustachian tube, right ear: Secondary | ICD-10-CM | POA: Diagnosis not present

## 2019-02-21 DIAGNOSIS — M1990 Unspecified osteoarthritis, unspecified site: Secondary | ICD-10-CM | POA: Diagnosis not present

## 2019-02-21 DIAGNOSIS — J440 Chronic obstructive pulmonary disease with acute lower respiratory infection: Secondary | ICD-10-CM | POA: Diagnosis not present

## 2019-02-21 DIAGNOSIS — C649 Malignant neoplasm of unspecified kidney, except renal pelvis: Secondary | ICD-10-CM | POA: Diagnosis not present

## 2019-02-21 HISTORY — PX: SALIVARY STONE REMOVAL: SHX5213

## 2019-02-21 SURGERY — REMOVAL, CALCULUS, SALIVARY DUCT
Anesthesia: General | Site: Neck | Laterality: Right

## 2019-02-21 MED ORDER — FENTANYL CITRATE (PF) 100 MCG/2ML IJ SOLN: INTRAMUSCULAR | Status: DC | PRN

## 2019-02-21 MED ORDER — SCOPOLAMINE 1 MG/3DAYS TD PT72
MEDICATED_PATCH | TRANSDERMAL | Status: DC | PRN
  Administered 2019-02-21: 1 via TRANSDERMAL

## 2019-02-21 MED ORDER — DEXAMETHASONE SODIUM PHOSPHATE 4 MG/ML IJ SOLN
INTRAMUSCULAR | Status: DC | PRN
  Administered 2019-02-21: 4 mg via INTRAVENOUS

## 2019-02-21 MED ORDER — ONDANSETRON HCL 4 MG/2ML IJ SOLN
INTRAMUSCULAR | Status: DC | PRN
  Administered 2019-02-21: 4 mg via INTRAVENOUS

## 2019-02-21 MED ORDER — BACITRACIN ZINC 500 UNIT/GM EX OINT
TOPICAL_OINTMENT | CUTANEOUS | Status: AC
  Filled 2019-02-21: qty 0.9

## 2019-02-21 MED ORDER — PROPOFOL 10 MG/ML IV BOLUS
INTRAVENOUS | Status: DC | PRN
  Administered 2019-02-21: 150 mg via INTRAVENOUS
  Administered 2019-02-21: 20 mg via INTRAVENOUS

## 2019-02-21 MED ORDER — OXYCODONE HCL 5 MG PO TABS: 5.0000 mg | ORAL_TABLET | Freq: Once | ORAL | Status: DC | PRN

## 2019-02-21 MED ORDER — LIDOCAINE-EPINEPHRINE 1 %-1:100000 IJ SOLN
INTRAMUSCULAR | Status: DC | PRN
  Administered 2019-02-21: .5 mL

## 2019-02-21 MED ORDER — LIDOCAINE-EPINEPHRINE 1 %-1:100000 IJ SOLN
INTRAMUSCULAR | Status: AC
  Filled 2019-02-21: qty 1

## 2019-02-21 MED ORDER — ACETAMINOPHEN 500 MG PO TABS: 1000.0000 mg | ORAL_TABLET | Freq: Once | ORAL | Status: DC

## 2019-02-21 MED ORDER — CIPROFLOXACIN-FLUOCINOLONE PF 0.3-0.025 % OT SOLN
OTIC | Status: AC
  Filled 2019-02-21: qty 0.25

## 2019-02-21 MED ORDER — SUCCINYLCHOLINE CHLORIDE 20 MG/ML IJ SOLN
INTRAMUSCULAR | Status: DC | PRN
  Administered 2019-02-21: 100 mg via INTRAVENOUS

## 2019-02-21 MED ORDER — EPHEDRINE SULFATE 50 MG/ML IJ SOLN
INTRAMUSCULAR | Status: DC | PRN
  Administered 2019-02-21 (×2): 10 mg via INTRAVENOUS

## 2019-02-21 MED ORDER — FENTANYL CITRATE (PF) 100 MCG/2ML IJ SOLN: 25.0000 ug | INTRAMUSCULAR | Status: DC | PRN

## 2019-02-21 MED ORDER — SCOPOLAMINE 1 MG/3DAYS TD PT72: 1.0000 | MEDICATED_PATCH | TRANSDERMAL | 3 refills | Status: AC

## 2019-02-21 MED ORDER — LACTATED RINGERS IV SOLN
INTRAVENOUS | Status: DC
  Administered 2019-02-21: 08:00:00 via INTRAVENOUS

## 2019-02-21 MED ORDER — PROMETHAZINE HCL 25 MG/ML IJ SOLN: 6.2500 mg | INTRAMUSCULAR | Status: DC | PRN

## 2019-02-21 MED ORDER — LIDOCAINE 2% (20 MG/ML) 5 ML SYRINGE
INTRAMUSCULAR | Status: DC | PRN
  Administered 2019-02-21: 40 mg via INTRAVENOUS

## 2019-02-21 MED ORDER — OXYCODONE HCL 5 MG/5ML PO SOLN: 5.0000 mg | Freq: Once | ORAL | Status: DC | PRN

## 2019-02-21 MED ORDER — FENTANYL CITRATE (PF) 100 MCG/2ML IJ SOLN
INTRAMUSCULAR | Status: DC | PRN
  Administered 2019-02-21: 50 ug via INTRAVENOUS

## 2019-02-21 MED ORDER — CEFAZOLIN SODIUM-DEXTROSE 2-3 GM-%(50ML) IV SOLR
INTRAVENOUS | Status: DC | PRN
  Administered 2019-02-21: 2 g via INTRAVENOUS

## 2019-02-21 MED ORDER — FENTANYL CITRATE (PF) 100 MCG/2ML IJ SOLN
INTRAMUSCULAR | Status: AC
  Filled 2019-02-21: qty 2

## 2019-02-21 MED ORDER — OXYMETAZOLINE HCL 0.05 % NA SOLN
NASAL | Status: AC
  Filled 2019-02-21: qty 60

## 2019-02-21 SURGICAL SUPPLY — 40 items
BLADE CLIPPER SENSICLIP SURGIC (BLADE) ×1 IMPLANT
BLADE EAR TYMPAN 2.5 60D BEAV (BLADE) IMPLANT
BLADE SURG 15 STRL LF DISP TIS (BLADE) ×1 IMPLANT
BLADE SURG 15 STRL SS (BLADE) ×1
CANISTER SUCT 1200ML W/VALVE (MISCELLANEOUS) ×2 IMPLANT
COVER BACK TABLE REUSABLE LG (DRAPES) ×2 IMPLANT
COVER MAYO STAND REUSABLE (DRAPES) ×2 IMPLANT
COVER WAND RF STERILE (DRAPES) IMPLANT
DECANTER SPIKE VIAL GLASS SM (MISCELLANEOUS) IMPLANT
DEPRESSOR TONGUE BLADE STERILE (MISCELLANEOUS) IMPLANT
DERMABOND ADVANCED (GAUZE/BANDAGES/DRESSINGS) ×1
DERMABOND ADVANCED .7 DNX12 (GAUZE/BANDAGES/DRESSINGS) IMPLANT
DRAPE HALF SHEET 70X43 (DRAPES) ×1 IMPLANT
DRAPE U-SHAPE 76X120 STRL (DRAPES) ×1 IMPLANT
ELECT COATED BLADE 2.86 ST (ELECTRODE) ×2 IMPLANT
ELECT REM PT RETURN 9FT ADLT (ELECTROSURGICAL) ×2
ELECTRODE REM PT RTRN 9FT ADLT (ELECTROSURGICAL) IMPLANT
GLOVE BIO SURGEON STRL SZ7.5 (GLOVE) ×2 IMPLANT
GLOVE BIOGEL PI IND STRL 7.0 (GLOVE) IMPLANT
GLOVE BIOGEL PI INDICATOR 7.0 (GLOVE) ×1
GLOVE EXAM NITRILE MD LF STRL (GLOVE) ×1 IMPLANT
GLOVE SURG SS PI 7.0 STRL IVOR (GLOVE) ×1 IMPLANT
GOWN STRL REUS W/ TWL LRG LVL3 (GOWN DISPOSABLE) IMPLANT
GOWN STRL REUS W/ TWL XL LVL3 (GOWN DISPOSABLE) IMPLANT
GOWN STRL REUS W/TWL LRG LVL3 (GOWN DISPOSABLE) ×2
GOWN STRL REUS W/TWL XL LVL3 (GOWN DISPOSABLE) ×1
MARKER SKIN DUAL TIP RULER LAB (MISCELLANEOUS) IMPLANT
NDL PRECISIONGLIDE 27X1.5 (NEEDLE) ×1 IMPLANT
NEEDLE PRECISIONGLIDE 27X1.5 (NEEDLE) ×2 IMPLANT
PACK BASIN DAY SURGERY FS (CUSTOM PROCEDURE TRAY) ×2 IMPLANT
PENCIL BUTTON HOLSTER BLD 10FT (ELECTRODE) ×2 IMPLANT
SUT CHROMIC 4 0 P 3 18 (SUTURE) IMPLANT
SUT VIC AB 4-0 P-3 18XBRD (SUTURE) IMPLANT
SUT VIC AB 4-0 P3 18 (SUTURE) ×1
SYR BULB 3OZ (MISCELLANEOUS) ×1 IMPLANT
SYR CONTROL 10ML LL (SYRINGE) ×2 IMPLANT
TOWEL GREEN STERILE FF (TOWEL DISPOSABLE) ×2 IMPLANT
TRAY DSU PREP LF (CUSTOM PROCEDURE TRAY) ×1 IMPLANT
TUBE CONNECTING 20X1/4 (TUBING) ×2 IMPLANT
YANKAUER SUCT BULB TIP NO VENT (SUCTIONS) ×1 IMPLANT

## 2019-02-21 NOTE — Anesthesia Preprocedure Evaluation (Addendum)
Anesthesia Evaluation  Patient identified by MRN, date of birth, ID band Patient awake    Reviewed: Allergy & Precautions, NPO status , Patient's Chart, lab work & pertinent test results  History of Anesthesia Complications Negative for: history of anesthetic complications  Airway Mallampati: II  TM Distance: >3 FB Neck ROM: Full    Dental no notable dental hx.    Pulmonary COPD, former smoker,    Pulmonary exam normal        Cardiovascular negative cardio ROS Normal cardiovascular exam     Neuro/Psych negative neurological ROS  negative psych ROS   GI/Hepatic Neg liver ROS, GERD  Controlled,  Endo/Other  negative endocrine ROS  Renal/GU negative Renal ROS  negative genitourinary   Musculoskeletal  (+) Arthritis ,   Abdominal   Peds  Hematology negative hematology ROS (+)   Anesthesia Other Findings Day of surgery medications reviewed with patient.  Reproductive/Obstetrics negative OB ROS                            Anesthesia Physical Anesthesia Plan  ASA: II  Anesthesia Plan: General   Post-op Pain Management:    Induction: Intravenous  PONV Risk Score and Plan: 2 and Treatment may vary due to age or medical condition and Ondansetron  Airway Management Planned: Oral ETT  Additional Equipment: None  Intra-op Plan:   Post-operative Plan: Extubation in OR  Informed Consent: I have reviewed the patients History and Physical, chart, labs and discussed the procedure including the risks, benefits and alternatives for the proposed anesthesia with the patient or authorized representative who has indicated his/her understanding and acceptance.     Dental advisory given  Plan Discussed with: CRNA  Anesthesia Plan Comments:        Anesthesia Quick Evaluation

## 2019-02-21 NOTE — Anesthesia Postprocedure Evaluation (Signed)
Anesthesia Post Note  Patient: Jerome Mays  Procedure(s) Performed: RIGHT PAROTID FISTULA REPAIR (Right Neck)     Patient location during evaluation: PACU Anesthesia Type: General Level of consciousness: awake and alert and oriented Pain management: pain level controlled Vital Signs Assessment: post-procedure vital signs reviewed and stable Respiratory status: spontaneous breathing, nonlabored ventilation and respiratory function stable Cardiovascular status: blood pressure returned to baseline Postop Assessment: no apparent nausea or vomiting Anesthetic complications: no    Last Vitals:  Vitals:   02/21/19 1000 02/21/19 1015  BP: 125/83 129/72  Pulse: 70 61  Resp: 12 11  Temp:    SpO2: 93% 92%    Last Pain:  Vitals:   02/21/19 1015  TempSrc:   PainSc: 0-No pain                 Brennan Bailey

## 2019-02-21 NOTE — H&P (Signed)
Cc: Salivary leakage  HPI: The patient is a 78 year old male who presents today complaining of salivary leakage from his parotid incision. The patient has been symptomatic for the past 2 months.  The patient previously underwent right parotidectomy in 06/2018.  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  According to the patient, he was doing well until 2 months ago, when he noted clear drainage from his right ear area during mealtimes.  He denies any tenderness, swelling or redness around the incision site.  In addition, he also complains of clogging sensation in his right ear.  He denies any otalgia or otorrhea. No other ENT, GI, or respiratory issue noted since the last visit.   Exam: General: Communicates without difficulty, well nourished, no acute distress. Head: Normocephalic, no evidence injury, no tenderness, facial buttresses intact without stepoff. Eyes: PERRL, EOMI. No scleral icterus, conjunctivae clear. Neuro: CN II exam reveals vision grossly intact.  No nystagmus at any point of gaze. Ears: Auricles well formed without lesions.  Bilateral cerumen impaction. Nose: External evaluation reveals normal support and skin without lesions.  Dorsum is intact.  Anterior rhinoscopy reveals healthy pink mucosa over anterior aspect of inferior turbinates and intact septum.  No purulence noted. Oral:  Oral cavity and oropharynx are intact, symmetric, without erythema or edema.  Mucosa is moist without lesions. Neck: Full range of motion without pain.  There is no significant lymphadenopathy.  The right neck incision is well healed. 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  It is situated immediately inferior to the right earlobe.  No skin erythema or edema is noted.  Thyroid bed within normal limits to palpation.  Submandibular glands equal bilaterally without mass.  Trachea is midline. Neuro:  CN 2-12 grossly intact. Gait normal. Vestibular: No nystagmus at any point of  gaze. The cerebellar examination is unremarkable.   Assessment 1.  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  It is situated immediately inferior to the right earlobe.  No skin erythema or edema is noted.  2.  Bilateral cerumen impaction.  After the disimpaction procedure, his tympanic membranes and middle ear spaces are normal.  3.  Right eustachian tube dysfunction.  No middle ear effusion is noted today.   Plan  1.  Otomicroscopy with bilateral cerumen disimpaction.  2.  The physical exam findings are reviewed with the patient.  3.  The patient may benefit from undergoing surgical closure of the right parotid fistula.    4.  The risks, benefits and details of the procedure are extensively reviewed.  5.  Flonase nasal spray 2 sprays each nostril daily.  6.  The patient would like to proceed with surgical closure of the parotid fistula.

## 2019-02-21 NOTE — Discharge Instructions (Addendum)
The patient may resume all his previous activities and diet.    Post Anesthesia Home Care Instructions  Activity: Get plenty of rest for the remainder of the day. A responsible individual must stay with you for 24 hours following the procedure.  For the next 24 hours, DO NOT: -Drive a car -Paediatric nurse -Drink alcoholic beverages -Take any medication unless instructed by your physician -Make any legal decisions or sign important papers.  Meals: Start with liquid foods such as gelatin or soup. Progress to regular foods as tolerated. Avoid greasy, spicy, heavy foods. If nausea and/or vomiting occur, drink only clear liquids until the nausea and/or vomiting subsides. Call your physician if vomiting continues.  Special Instructions/Symptoms: Your throat may feel dry or sore from the anesthesia or the breathing tube placed in your throat during surgery. If this causes discomfort, gargle with warm salt water. The discomfort should disappear within 24 hours.  If you had a scopolamine patch placed behind your ear for the management of post- operative nausea and/or vomiting:  1. The medication in the patch is effective for 72 hours, after which it should be removed.  Wrap patch in a tissue and discard in the trash. Wash hands thoroughly with soap and water. 2. You may remove the patch earlier than 72 hours if you experience unpleasant side effects which may include dry mouth, dizziness or visual disturbances. 3. Avoid touching the patch. Wash your hands with soap and water after contact with the patch.       Call your surgeon if you experience:   1.  Fever over 101.0. 2.  Inability to urinate. 3.  Nausea and/or vomiting. 4.  Extreme swelling or bruising at the surgical site. 5.  Continued bleeding from the incision. 6.  Increased pain, redness or drainage from the incision. 7.  Problems related to your pain medication. 8.  Any problems and/or concerns

## 2019-02-21 NOTE — Anesthesia Procedure Notes (Signed)
Procedure Name: Intubation Date/Time: 02/21/2019 9:16 AM Performed by: Lavonia Dana, CRNA Pre-anesthesia Checklist: Patient identified, Emergency Drugs available, Suction available and Patient being monitored Patient Re-evaluated:Patient Re-evaluated prior to induction Oxygen Delivery Method: Circle system utilized Preoxygenation: Pre-oxygenation with 100% oxygen Induction Type: IV induction Ventilation: Mask ventilation without difficulty Laryngoscope Size: Miller and 2 Grade View: Grade I Tube type: Oral Tube size: 8.0 mm Number of attempts: 1 Airway Equipment and Method: Stylet and Oral airway Placement Confirmation: ETT inserted through vocal cords under direct vision,  positive ETCO2 and breath sounds checked- equal and bilateral Secured at: 24 cm Tube secured with: Tape Dental Injury: Teeth and Oropharynx as per pre-operative assessment

## 2019-02-21 NOTE — Transfer of Care (Signed)
Immediate Anesthesia Transfer of Care Note  Patient: Jerome Mays  Procedure(s) Performed: RIGHT PAROTID FISTULA REPAIR (Right Neck)  Patient Location: PACU  Anesthesia Type:General  Level of Consciousness: awake, alert  and oriented  Airway & Oxygen Therapy: Patient Spontanous Breathing and Patient connected to face mask oxygen  Post-op Assessment: Report given to RN and Post -op Vital signs reviewed and stable  Post vital signs: Reviewed and stable  Last Vitals:  Vitals Value Taken Time  BP 139/72 02/21/19 0949  Temp    Pulse 76 02/21/19 0956  Resp 16 02/21/19 0956  SpO2 96 % 02/21/19 0956  Vitals shown include unvalidated device data.  Last Pain:  Vitals:   02/21/19 0749  TempSrc: Oral  PainSc: 0-No pain      Patients Stated Pain Goal: 1 ([id number])  Complications: No apparent anesthesia complications

## 2019-02-21 NOTE — Op Note (Signed)
DATE OF PROCEDURE:  02/21/2019                              OPERATIVE REPORT  SURGEON:  Leta Baptist, MD  PREOPERATIVE DIAGNOSES: 1. Right parotid fistula  POSTOPERATIVE DIAGNOSES: 1. Right parotid fistula  PROCEDURE PERFORMED:  Surgical repair of right parotid fistula (CPT 42600)  ANESTHESIA:  General endotracheal tube anesthesia.  COMPLICATIONS:  None.  ESTIMATED BLOOD LOSS:  Minimal.  INDICATION FOR PROCEDURE:  HARDEN PURGASON is a 78 y.o. male with a history of a right parotid mass. The patient previously underwent right parotidectomy in 06/2018.  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  According to the patient, he was doing well until May of this year, when he noted clear drainage from his right ear area during mealtimes.  He denies any tenderness, swelling or redness around the incision site. The findings were consistent with a parotid fistula. Based on the above findings, the decision was made for the patient to undergo the above stated procedure.  The risks, benefits, alternatives, and details of the procedure were discussed with the patient.  Questions were invited and answered.  Informed consent was obtained.  DESCRIPTION:  The patient was taken to the operating room and placed supine on the operating table.  General endotracheal tube anesthesia was administered by the anesthesiologist.  The patient was positioned and prepped and draped in a standard fashion for right parotid fistula repair.   Examination of the right neck showed a pinpoint parotid fistula inferior to the right earlobe. 1% lidocaine with 1:100000 epinephrine was injected around the fistula. The fistula was cannulated with a lacrimal probe. A cuff of tissue was excised around the fistula. The surgical site was the closed with 4-0 vicryl and dermabond. The care of the patient was turned over to the anesthesiologist.  The patient was awakened from anesthesia without difficulty.  The patient was extubated and  transferred to the recovery room in good condition.  OPERATIVE FINDINGS: Right parotid fistula.  SPECIMEN:  None  FOLLOWUP CARE:  The patient will be discharged home once awake and alert.  He will be placed on scopolamine patch every 3 days, and Tylenol/ibuprofen for postop pain control.   The patient will follow up in my office in approximately 1 week.  Iyanni Hepp W Daeshawn Redmann 02/21/2019 9:37 AM

## 2019-02-23 ENCOUNTER — Encounter (HOSPITAL_BASED_OUTPATIENT_CLINIC_OR_DEPARTMENT_OTHER): Payer: Self-pay | Admitting: Otolaryngology

## 2019-02-27 ENCOUNTER — Ambulatory Visit (INDEPENDENT_AMBULATORY_CARE_PROVIDER_SITE_OTHER): Payer: Medicare HMO | Admitting: Otolaryngology

## 2019-02-28 NOTE — Progress Notes (Addendum)
[facility] Note  03/01/2019     CHIEF COMPLAINT Patient presents for Retina Follow Up   HISTORY OF PRESENT ILLNESS: Jerome Mays is a 78 y.o. male who presents to the clinic today for:   HPI    Retina Follow Up    Patient presents with  Other.  In left eye.  This started weeks ago.  Severity is moderate.  Duration of weeks.  Since onset it is stable.  I, the attending physician,  performed the HPI with the patient and updated documentation appropriately.          Comments    Patient states his vision is about the same.  Patient denies eye pain or discomfort and denies any new or worsening floaters or flashes of light.       Last edited by [name], MD on 03/01/2019  8:10 AM. (History)    pt had sx on his neck about a week ago, he states his left eye is doing "pretty good", but he still can't see out of it very well  Referring physician: [name], MD [place],  [place] [address]  HISTORICAL INFORMATION:   Selected notes from the [facility] Initial referral from Dr. [name] for RD OS s/p SBP (42 band) + 25g PPV/PFC/EL/FAX/SO OS 9.12.18 s/p PPV w/ membrane peel/relaxing retinectomy/PFC/SOX OS under GA, 10.15.18   CURRENT MEDICATIONS: Current Outpatient Medications (Ophthalmic Drugs)  Medication Sig  . ketorolac (ACULAR) 0.5 % ophthalmic solution Place 1 drop into the left eye 4 (four) times daily.  . prednisoLONE acetate (PRED FORTE) 1 % ophthalmic suspension INSTILL 1 DROP INTO LEFT EYE 4 TIMES DAILY  . sodium chloride (MURO 128) 5 % ophthalmic solution Place 1 drop into the left eye 2 (two) times a day.   No current facility-administered medications for this visit.  (Ophthalmic Drugs)   Current Outpatient Medications (Other)  Medication Sig  . allopurinol (ZYLOPRIM) 300 MG tablet Take 300 mg by mouth daily.   [name] aspirin EC 81 MG tablet Take 81 mg by mouth daily.  . cholecalciferol (VITAMIN D3) 25 MCG  (1000 UT) tablet Take 1,000 Units by mouth daily.  . diphenhydramine-acetaminophen (TYLENOL PM) 25-500 MG TABS tablet Take 2 tablets by mouth at bedtime as needed (sleep).   . magnesium 30 MG tablet Take 30 mg by mouth 2 (two) times daily.  . Multiple Vitamin (ONE-A-DAY MENS PO) Take 1 tablet by mouth daily.  [name] scopolamine (TRANSDERM-SCOP, 1.5 MG,) 1 MG/3DAYS Place 1 patch (1.5 mg total) onto the skin every 3 (three) days for 12 days.  . TRELEGY ELLIPTA 100-62.5-25 MCG/INH AEPB INHALE 1 PUFF ONCE DAILY   No current facility-administered medications for this visit.  (Other)      REVIEW OF SYSTEMS: ROS    Positive for: Eyes   Negative for: Constitutional, Gastrointestinal, Neurological, Skin, Genitourinary, Musculoskeletal, HENT, Endocrine, Cardiovascular, Respiratory, Psychiatric, Allergic/Imm, Heme/Lymph   Last edited by Doneen Poisson on 03/01/2019  7:44 AM. (History)       ALLERGIES Allergies  Allergen Reactions  . Lipitor [Atorvastatin] Rash and Other (See Comments)    Passed out    PAST MEDICAL HISTORY Past Medical History:  Diagnosis Date  . Anemia    low iron  . Arthritis   . Colonic polyp   . Constipation   . COPD (chronic obstructive pulmonary disease) ([place])   . GERD (gastroesophageal reflux disease)   . Gout   . History  of kidney stones   . Malignant tumor of kidney ([place])   . Pain    LOWER BACK AND LEFT HIP - HX OF PREVIOUS LUMBAR AND CERVICAL SURGERY--PT STATES HE HAS HAD NUMBNESS IN BOTH FEET SINCE HIS BACK SURGERY  . Renal calculus   . Retinal detachment    Past Surgical History:  Procedure Laterality Date  . BACK SURGERY     LUMBAR SURGERY  . CATARACT EXTRACTION Bilateral 2003   Surgeon unknown  . EYE SURGERY     BILATERAL CATARACT EXTRACTIONS  . GIVENS CAPSULE STUDY N/A 10/07/2015   Procedure: GIVENS CAPSULE STUDY;  Surgeon: Carol Ada, MD;  Location: [facility];  Service: Endoscopy;  Laterality: N/A;  . HAND SURGERY     left  . KNEE SURGERY      right  . LAMINECTOMY     cervical  . LASER PHOTO ABLATION Left 08/19/2017   Procedure: LASER PHOTO ABLATION;  Surgeon: [name], MD;  Location: [facility];  Service: Ophthalmology;  Laterality: Left;  . MEMBRANE PEEL Left 03/22/2017   Procedure: POSSIBLE MEMBRANE PEEL WITH SILICONE OIL AND PERFLURON;  Surgeon: [name], MD;  Location: [facility];  Service: Ophthalmology;  Laterality: Left;  . NEPHRECTOMY     PT STATES RIGHT KIDNEY WAS REMOVED  . PAROTIDECTOMY Right 06/13/2018   Procedure: RIGHT TOTAL PAROTIDECTOMY;  Surgeon: Leta Baptist, MD;  Location: [facility];  Service: ENT;  Laterality: Right;  . PARS PLANA VITRECTOMY Left 03/22/2017   Procedure: PARS PLANA VITRECTOMY WITH 25 GAUGE;  Surgeon: [name], MD;  Location: [facility];  Service: Ophthalmology;  Laterality: Left;  . PARS PLANA VITRECTOMY W/ SCLERAL BUCKLE Left 08/19/2017   Procedure: 41 GAUGE PARS PLANA VITRECTOMY WITH SILCONE OIL REMOVAL;  Surgeon: [name], MD;  Location: [facility];  Service: Ophthalmology;  Laterality: Left;  . 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 SALIVARY STONE REMOVAL Right 02/21/2019   Procedure: RIGHT PAROTID FISTULA REPAIR;  Surgeon: Leta Baptist, MD;  Location: [facility];  Service: ENT;  Laterality: Right;  . SHOULDER SURGERY  2020   [facility]  . TOTAL KNEE ARTHROPLASTY Left 10/19/2012   Procedure: LEFT TOTAL KNEE ARTHROPLASTY;  Surgeon: Magnus Sinning, MD;  Location: [facility];  Service: Orthopedics;  Laterality: Left;  [name] VITRECTOMY 25 GAUGE WITH SCLERAL BUCKLE Left 02/17/2017   Procedure: VITRECTOMY 25 GAUGE WITH SCLERAL BUCKLE membrane peel, injection of silicone oil, and endolaser photocoagulation.;  Surgeon: [name], MD;  Location: [facility];  Service: Ophthalmology;  Laterality: Left;    FAMILY HISTORY Family History  Problem Relation Age of Onset  . Diabetes Mother   . COPD Brother   . COPD Sister   . Amblyopia Neg Hx   . Blindness Neg Hx   . Cataracts Neg Hx   .  Glaucoma Neg Hx   . Macular degeneration Neg Hx   . Retinal detachment Neg Hx   . Retinitis pigmentosa Neg Hx     SOCIAL HISTORY Social History   Tobacco Use  . Smoking status: Former Smoker    Packs/day: 2.00    Years: 40.00    Pack years: 80.00    Types: Cigarettes, Pipe    Quit date: 06/09/1995    Years since quitting: 23.7  . Smokeless tobacco: Never Used  Substance Use Topics  . Alcohol use: No  . Drug use: No         OPHTHALMIC EXAM:  Base Eye Exam  Visual Acuity (Snellen - Linear)      Right Left   Dist cc 20/20 -2 20/80 -2   Dist ph cc  NI   Correction: Glasses       Tonometry (Tonopen, 7:49 AM)      Right Left   Pressure 11 11       Pupils      Dark Light Shape React APD   Right 3 2 Round Minimal 0   Left 5 5 Round Slow 0  Cloudy OS       Visual Fields      Left Right    Full Full       Extraocular Movement      Right Left    Full Full       Neuro/Psych    Oriented x3: Yes   Mood/Affect: Normal       Dilation    Both eyes: 2.5% Phenylephrine, 1.0% Mydriacyl @ 7:49 AM        Slit Lamp and Fundus Exam    External Exam      Right Left   External Normal Periorbital edema improving       Slit Lamp Exam      Right Left   Lids/Lashes Dermatochalasis - upper lid Dermatochalasis - upper lid   Conjunctiva/Sclera White and quiet STK ST quad   Cornea Arcus, trace Punctate epithelial erosions 1+ Descemet's folds, Arcus   Anterior Chamber Deep and quiet Deep, quiet   Iris Round and dilated Round and moderately dilated   Lens Three piece Posterior chamber intraocular lens Three piece Posterior chamber intraocular lens in good position, micro condensations on posterior lens surface - stable   Vitreous Vitreous syneresis Post vitrectomy       Fundus Exam      Right Left   Disc Pink and Sharp, mild Peripapillary atrophy Sharp rim, mild pallor, temporal Peripapillary atrophy, +cupping   C/D Ratio 0.4 0.6   Macula Flat, good foveal  reflex, Retinal pigment epithelial mottling - mild, No heme or edema  flat; Blunted foveal reflex, mild cystic changes temporal macula - mild increase   Vessels Vascular attenuation, Tortuous Vascular attenuation   Periphery Attached, mild Reticular degeneration; no RT or RD Attached, moderate buckle height; temporal/inf retinectomy from 2-630 with peripheral edge attached with good laser at the edge; trace areas of fibrosis inferiorly and inf temporal -- surrounded by good laser        Refraction    Wearing Rx      Sphere Cylinder Axis Add   Right -0.75 +1.00 151 +2.50   Left -0.50 +1.00 020 +2.50   Type: PAL          IMAGING AND PROCEDURES  Imaging and Procedures for 09/19/17  OCT, Retina - OU - Both Eyes       Right Eye Quality was good. Central Foveal Thickness: 247. Progression has been stable. Findings include normal foveal contour, no IRF, no SRF.   Left Eye Quality was good. Central Foveal Thickness: 257. Progression has worsened. Findings include no SRF, epiretinal membrane, intraretinal fluid, abnormal foveal contour (Patchy ORA, mild interval increase IRF/CME temporal macula).   Notes Images taken, stored on drive  Diagnosis / Impression:  OD: NFP, No IRF, No SRF OS: retina attached with oil out - mild interval increase in IRF/CME temporal macula  Clinical management:  See below  Abbreviations: NFP - Normal foveal profile. CME - cystoid macular edema. PED - pigment epithelial detachment. IRF -  intraretinal fluid. SRF - subretinal fluid. EZ - ellipsoid zone. ERM - epiretinal membrane. ORA - outer retinal atrophy. ORT - outer retinal tubulation. SRHM - subretinal hyper-reflective material                  ASSESSMENT/PLAN:    ICD-10-CM   1. 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 Cystoid macular edema of left eye  H35.352   4. Retinal edema  H35.81 OCT, Retina - OU - Both Eyes  5. Pseudophakia,  both eyes  Z96.1   6. Corneal edema of left eye  H18.20     1-4. 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  - inferior detachment from 2-10 oclock with HST at ~530  - fovea off, but sup nasal macula attached  - by history RD likely started 4-5 wks prior to initial presentation, but fovea became involved ~Saturday, 02/13/17  - s/p SBP (42 band) + 25g PPV/PFC/EL/FAX/SO OS 9.12.18  - extensive PVR along inf temp arcades with tractional redetachment inf temp noted POM1  - s/p PPV w/ membrane peel/relaxing retinectomy/PFC/SOX OS under GA, 10.15.18  - s/p PPV/SOR/EL OS, 3.14.19  - lost to f/u from 12.19.19 to 7.14.20 due to shoulder and parotid surgeries and COVID-19 restrictions  - retina attached and in good position  - s/p STK OS #1 (07.14.20) for CME  - currently on PF and Ketorolac TID OS for CME  - OCT today shows interval increase in focal IRF/CME temporal macula OS  - mild persistent corneal edema -- increase Muro 128 to QID OS  - increase PF and ketorolac to QID OS  - IOP 11 today -- good off Alphagan P  - cont PSO ung PRN OS  - f/u 6 weeks for K and CME check  5. Pseudophakia OU  - IOLs in good position OU  - monitor  6. Corneal edema OS  - persistent post op corneal edema OS as above  - cont Muro 128 BID as above  - will refer to cornea specialist for evaluation and management if continues to persist  Ophthalmic Meds Ordered this visit:  No orders of the defined types were placed in this encounter.      Return in about 6 weeks (around 04/12/2019) for f/u CME / K check OS, DFE, OCT.  There are no Patient Instructions on file for this visit.   Explained the diagnoses, plan, and follow up with the patient and they expressed understanding.  Patient expressed understanding of the importance of proper follow up care.  This document serves as a record of services personally performed by Gardiner Sleeper, MD, PhD. It was created on their behalf by Ernest Mallick, OA, an  ophthalmic assistant. The creation of this record is the provider's dictation and/or activities during the visit.    Electronically signed by: Ernest Mallick, OA  09.22.2020 8:39 AM    Gardiner Sleeper, M.D., Ph.D. Diseases & Surgery of the Retina and Vitreous [facility]  I have reviewed the above documentation for accuracy and completeness, and I agree with the above. Gardiner Sleeper, M.D., Ph.D. 03/01/19 8:39 AM   Abbreviations: M myopia (nearsighted); A astigmatism; H hyperopia (farsighted); P presbyopia; Mrx spectacle prescription;  CTL contact lenses; OD right eye; OS left eye; OU both eyes  XT exotropia; ET esotropia; PEK punctate epithelial keratitis; PEE punctate epithelial erosions; DES dry eye syndrome; MGD meibomian gland dysfunction;  ATs artificial tears; PFAT's preservative free artificial tears; [place] nuclear sclerotic cataract; PSC posterior subcapsular cataract; ERM epi-retinal membrane; PVD posterior vitreous detachment; RD retinal detachment; DM diabetes mellitus; DR diabetic retinopathy; NPDR non-proliferative diabetic retinopathy; PDR proliferative diabetic retinopathy; CSME clinically significant macular edema; DME diabetic macular edema; dbh dot blot hemorrhages; CWS cotton wool spot; POAG primary open angle glaucoma; C/D cup-to-disc ratio; HVF humphrey visual field; GVF goldmann visual field; OCT optical coherence tomography; IOP intraocular pressure; BRVO Branch retinal vein occlusion; CRVO central retinal vein occlusion; CRAO central retinal artery occlusion; BRAO branch retinal artery occlusion; RT retinal tear; SB scleral buckle; PPV pars plana vitrectomy; VH Vitreous hemorrhage; PRP panretinal laser photocoagulation; IVK intravitreal kenalog; VMT vitreomacular traction; MH Macular hole;  NVD neovascularization of the disc; NVE neovascularization elsewhere; AREDS age related eye disease study; ARMD age related macular degeneration; POAG primary open angle  glaucoma; EBMD epithelial/anterior basement membrane dystrophy; ACIOL anterior chamber intraocular lens; IOL intraocular lens; PCIOL posterior chamber intraocular lens; Phaco/IOL phacoemulsification with intraocular lens placement; [place] photorefractive keratectomy; LASIK laser assisted in situ keratomileusis; HTN hypertension; DM diabetes mellitus; COPD chronic obstructive pulmonary disease

## 2019-03-01 ENCOUNTER — Ambulatory Visit (INDEPENDENT_AMBULATORY_CARE_PROVIDER_SITE_OTHER): Payer: Medicare HMO | Admitting: Ophthalmology

## 2019-03-01 ENCOUNTER — Other Ambulatory Visit: Payer: Self-pay

## 2019-03-01 ENCOUNTER — Encounter (INDEPENDENT_AMBULATORY_CARE_PROVIDER_SITE_OTHER): Payer: Self-pay | Admitting: Ophthalmology

## 2019-03-01 DIAGNOSIS — H3581 Retinal edema: Secondary | ICD-10-CM | POA: Diagnosis not present

## 2019-03-01 DIAGNOSIS — H3522 Other non-diabetic proliferative retinopathy, left eye: Secondary | ICD-10-CM | POA: Diagnosis not present

## 2019-03-01 DIAGNOSIS — H3322 Serous retinal detachment, left eye: Secondary | ICD-10-CM

## 2019-03-01 DIAGNOSIS — H35352 Cystoid macular degeneration, left eye: Secondary | ICD-10-CM

## 2019-03-01 DIAGNOSIS — Z961 Presence of intraocular lens: Secondary | ICD-10-CM | POA: Diagnosis not present

## 2019-03-01 DIAGNOSIS — H1820 Unspecified corneal edema: Secondary | ICD-10-CM

## 2019-03-02 ENCOUNTER — Other Ambulatory Visit: Payer: Self-pay | Admitting: Pulmonary Disease

## 2019-03-10 DIAGNOSIS — M25512 Pain in left shoulder: Secondary | ICD-10-CM | POA: Diagnosis not present

## 2019-03-16 DIAGNOSIS — K219 Gastro-esophageal reflux disease without esophagitis: Secondary | ICD-10-CM | POA: Diagnosis not present

## 2019-03-16 DIAGNOSIS — N183 Chronic kidney disease, stage 3 unspecified: Secondary | ICD-10-CM | POA: Diagnosis not present

## 2019-03-16 DIAGNOSIS — K115 Sialolithiasis: Secondary | ICD-10-CM | POA: Diagnosis not present

## 2019-03-16 DIAGNOSIS — M109 Gout, unspecified: Secondary | ICD-10-CM | POA: Diagnosis not present

## 2019-03-16 DIAGNOSIS — M5416 Radiculopathy, lumbar region: Secondary | ICD-10-CM | POA: Diagnosis not present

## 2019-03-16 DIAGNOSIS — E7439 Other disorders of intestinal carbohydrate absorption: Secondary | ICD-10-CM | POA: Diagnosis not present

## 2019-03-16 DIAGNOSIS — M1990 Unspecified osteoarthritis, unspecified site: Secondary | ICD-10-CM | POA: Diagnosis not present

## 2019-03-16 DIAGNOSIS — M25562 Pain in left knee: Secondary | ICD-10-CM | POA: Diagnosis not present

## 2019-03-16 DIAGNOSIS — C649 Malignant neoplasm of unspecified kidney, except renal pelvis: Secondary | ICD-10-CM | POA: Diagnosis not present

## 2019-03-16 DIAGNOSIS — D509 Iron deficiency anemia, unspecified: Secondary | ICD-10-CM | POA: Diagnosis not present

## 2019-03-17 DIAGNOSIS — M25511 Pain in right shoulder: Secondary | ICD-10-CM | POA: Diagnosis not present

## 2019-03-27 DIAGNOSIS — C641 Malignant neoplasm of right kidney, except renal pelvis: Secondary | ICD-10-CM | POA: Diagnosis not present

## 2019-03-27 DIAGNOSIS — N529 Male erectile dysfunction, unspecified: Secondary | ICD-10-CM | POA: Diagnosis not present

## 2019-03-27 DIAGNOSIS — N138 Other obstructive and reflux uropathy: Secondary | ICD-10-CM | POA: Diagnosis not present

## 2019-03-27 DIAGNOSIS — Q600 Renal agenesis, unilateral: Secondary | ICD-10-CM | POA: Diagnosis not present

## 2019-03-27 DIAGNOSIS — N200 Calculus of kidney: Secondary | ICD-10-CM | POA: Diagnosis not present

## 2019-03-27 DIAGNOSIS — N401 Enlarged prostate with lower urinary tract symptoms: Secondary | ICD-10-CM | POA: Diagnosis not present

## 2019-03-27 DIAGNOSIS — N402 Nodular prostate without lower urinary tract symptoms: Secondary | ICD-10-CM | POA: Diagnosis not present

## 2019-04-03 ENCOUNTER — Other Ambulatory Visit: Payer: Self-pay | Admitting: Pulmonary Disease

## 2019-04-17 DIAGNOSIS — M25512 Pain in left shoulder: Secondary | ICD-10-CM | POA: Diagnosis not present

## 2019-04-17 DIAGNOSIS — M542 Cervicalgia: Secondary | ICD-10-CM | POA: Diagnosis not present

## 2019-04-17 DIAGNOSIS — M6281 Muscle weakness (generalized): Secondary | ICD-10-CM | POA: Diagnosis not present

## 2019-04-17 DIAGNOSIS — M25511 Pain in right shoulder: Secondary | ICD-10-CM | POA: Diagnosis not present

## 2019-04-27 DIAGNOSIS — M542 Cervicalgia: Secondary | ICD-10-CM | POA: Diagnosis not present

## 2019-05-01 ENCOUNTER — Other Ambulatory Visit: Payer: Self-pay | Admitting: Pulmonary Disease

## 2019-05-02 ENCOUNTER — Other Ambulatory Visit: Payer: Self-pay | Admitting: Pulmonary Disease

## 2019-05-02 ENCOUNTER — Telehealth: Payer: Self-pay | Admitting: Pulmonary Disease

## 2019-05-02 NOTE — Telephone Encounter (Signed)
Called and spoke with pt's wife [name]. [name] stated that pt is needing a refill on his Trelegy. Stated to her that he is due for an OV and stated to her that we needed to schedule him an OV and then we could take care of refilling his Trelegy at that time. I stated to her that we could make the visit be a televisit and she verbalized understanding. televisit scheduled for pt with Beth tomorrow 11/25 at 9am. Nothing further needed.

## 2019-05-03 ENCOUNTER — Encounter: Payer: Self-pay | Admitting: Primary Care

## 2019-05-03 ENCOUNTER — Other Ambulatory Visit: Payer: Self-pay

## 2019-05-03 ENCOUNTER — Ambulatory Visit (INDEPENDENT_AMBULATORY_CARE_PROVIDER_SITE_OTHER): Payer: Medicare HMO | Admitting: Primary Care

## 2019-05-03 DIAGNOSIS — J449 Chronic obstructive pulmonary disease, unspecified: Secondary | ICD-10-CM

## 2019-05-03 MED ORDER — TRELEGY ELLIPTA 100-62.5-25 MCG/INH IN AEPB: 1.0000 | INHALATION_SPRAY | Freq: Every day | RESPIRATORY_TRACT | 11 refills | Status: DC

## 2019-05-03 NOTE — Progress Notes (Signed)
Virtual Visit via Telephone Note  I connected with Jerome Mays on 05/03/19 at  9:00 AM EST by telephone and verified that I am speaking with the correct person using two identifiers.  Location: Patient: Home Provider: Home   I discussed the limitations, risks, security and privacy concerns of performing an evaluation and management service by telephone and the availability of in person appointments. I also discussed with the patient that there may be a patient responsible charge related to this service. The patient expressed understanding and agreed to proceed.   History of Present Illness: 78 year old male, former smoker quit in 1997 (80 pack year hx). PMH significant for COPD group B, malignant kidney neoplasm, colonic polyp. Patient of Dr. Vaughan Browner, last seen on 04/25/18. Taken off Daliresp in 2018 d/t cost and side effects. Maintained on Trelegy. Followed by Dr. Benjamine Mola for right parotid mass, underwent right parotidectomy in Jan 2020 with right parotid fistula repair in September.   05/03/2019 Patient contacted today for annual follow-up/televisit. Accompanied by his wife on phone call. He is doing well. His day to day breathing is stable. He has not further episodes of hemoptysis and no recent COPD exacerbations. Continues using Trelegy 1 puff daily as prescribed, needs refill of medication. Has not needed rescue inhaler use. He had right rotator cuff surgery this summer, tolerated procedure well with no issues. Received flu vaccine this year.   Observations/Objective:  - No shortness of breath, wheezing or cough noted during phone conversation  Data Reviewed: Imaging CT scan 10/21/09- Moderate centrilobar emphysema. No pulmonary masses, opacities, asbestos disease or consolidation. Stable enlarged. Stable enlarged mediastinal and bilateral hilar lymph nodes unchanged for past 3.5 years compatible with benign or reactive process.  CT scan 06/19/16-emphysema, no consolidation or lung  opacity. Mediastinal and bilateral hilar lymphadenopathy stable in size. Clustered left upper lobe nodules.  CT scan 07/01/17- stable subcentimeter pulmonary nodules, stable calcified mediastinal, hilar lymphadenopathy.  Chest x-ray 04/21/2018- Right anterior first lobe nondisplaced fracture.  No pleural effusion or pneumothorax.  Lungs are clear. I have reviewed the images personally.  PFTs 02/04/16 FVC 4.05 [9%), FEV1 2.40 (70%), F/F 59, TLC 101%, DLCO 35% Moderate obstructive defect with severe reduction in diffusion capacity.  Labs CBC 05/21/17-WBC 7.2, eosinophils 3.7%, absolute eos count 266 Blood allergy profile 05/21/17-IgE 22, RAST panel is negative.  Assessment and Plan:  COPD: - Stable, no recent exacerbations - Continue TRELEGY 1 puff daily (refill sent) - Up to date with influenza vaccine   Hemoptysis - Resolved, no further episodes  Follow Up Instructions:   - 6 months with Dr. Vaughan Browner   I discussed the assessment and treatment plan with the patient. The patient was provided an opportunity to ask questions and all were answered. The patient agreed with the plan and demonstrated an understanding of the instructions.   The patient was advised to call back or seek an in-person evaluation if the symptoms worsen or if the condition fails to improve as anticipated.  I provided 15 minutes of non-face-to-face time during this encounter.   Martyn Ehrich, NP

## 2019-05-03 NOTE — Patient Instructions (Signed)
  COPD: - Stable, no recent exacerbations - Continue TRELEGY 1 puff daily (refill sent) - Up to date with influenza vaccine   Hemoptysis - Resolved, no further episodes  Follow Up Instructions:   - 6 months with Dr. Vaughan Browner or sooner if needed

## 2019-05-09 DIAGNOSIS — L570 Actinic keratosis: Secondary | ICD-10-CM | POA: Diagnosis not present

## 2019-05-09 DIAGNOSIS — L821 Other seborrheic keratosis: Secondary | ICD-10-CM | POA: Diagnosis not present

## 2019-05-09 DIAGNOSIS — L111 Transient acantholytic dermatosis [Grover]: Secondary | ICD-10-CM | POA: Diagnosis not present

## 2019-05-09 DIAGNOSIS — L280 Lichen simplex chronicus: Secondary | ICD-10-CM | POA: Diagnosis not present

## 2019-05-15 ENCOUNTER — Encounter: Payer: Self-pay | Admitting: Neurology

## 2019-05-15 ENCOUNTER — Other Ambulatory Visit: Payer: Self-pay

## 2019-05-15 ENCOUNTER — Telehealth: Payer: Self-pay | Admitting: Neurology

## 2019-05-15 ENCOUNTER — Ambulatory Visit: Payer: Medicare HMO | Admitting: Neurology

## 2019-05-15 VITALS — BP 117/70 | HR 71 | Temp 97.2°F | Ht 73.0 in | Wt 179.0 lb

## 2019-05-15 DIAGNOSIS — M6250 Muscle wasting and atrophy, not elsewhere classified, unspecified site: Secondary | ICD-10-CM | POA: Diagnosis not present

## 2019-05-15 DIAGNOSIS — R531 Weakness: Secondary | ICD-10-CM | POA: Diagnosis not present

## 2019-05-15 DIAGNOSIS — E559 Vitamin D deficiency, unspecified: Secondary | ICD-10-CM | POA: Diagnosis not present

## 2019-05-15 DIAGNOSIS — R253 Fasciculation: Secondary | ICD-10-CM | POA: Diagnosis not present

## 2019-05-15 DIAGNOSIS — M6281 Muscle weakness (generalized): Secondary | ICD-10-CM | POA: Diagnosis not present

## 2019-05-15 DIAGNOSIS — R799 Abnormal finding of blood chemistry, unspecified: Secondary | ICD-10-CM | POA: Diagnosis not present

## 2019-05-15 DIAGNOSIS — E538 Deficiency of other specified B group vitamins: Secondary | ICD-10-CM | POA: Diagnosis not present

## 2019-05-15 DIAGNOSIS — R7989 Other specified abnormal findings of blood chemistry: Secondary | ICD-10-CM | POA: Diagnosis not present

## 2019-05-15 NOTE — Telephone Encounter (Signed)
Dr. Krista Blue, did you have an appt in mind for this patient's NCV/EMG? Patient stated at [facility] he was told he would have his NCV/EMG before Christmas.

## 2019-05-15 NOTE — Progress Notes (Signed)
PATIENT: Jerome Mays DOB: [birth year]  Chief Complaint  Patient presents with  . Muscle Atrophy    He is here with his wife, Jerome Mays.  He has been experiencing some progressive weakness in his upper extremities.  He is unable to lift his arms above his shoulders.  His wife is having to help him get dressed.  Evelene Croon, MD - referring provider  . PCP    [name], MD     HISTORICAL  Jerome Mays is a 78 year old male, seen in request by his primary care physician Dr. [name], and pain management Dr. Nelva Bush, Delfino Lovett, for evaluation of gradual onset of muscle atrophy, he is accompanied by his wife at today's visit on May 15, 2019.  I have reviewed and summarized the referring note from the referring physician.  He was previously fairly healthy, in 2019, he noticed gradual onset difficulty raising right arm overhead, but he denies significant right shoulder pain, he was diagnosed with right rotator cuff surgery, had surgery, which failed to improve his right arm weakness, since summer of 2020, he also noticed gradual onset left arm weakness, progressively worse, over the past couple years, he had frequent bilateral proximal thigh muscle cramping, but he denies sensory changes, denies bilateral lower extremity weakness, he denies significant dysarthria, no dysphagia, he denies significant difficulty breathing, but he tends to sleep in a sitting up position at nighttime due to bilateral arm weakness  He also had 10 pound weight loss over past 1 year,  He had MRI of cervical spine at [facility], I do not have the report  REVIEW OF SYSTEMS: Full 14 system review of systems performed and notable only for as above All other review of systems were negative.  ALLERGIES: Allergies  Allergen Reactions  . Lipitor [Atorvastatin] Rash and Other (See Comments)    Passed out    HOME MEDICATIONS: Current Outpatient Medications  Medication Sig  Dispense Refill  . allopurinol (ZYLOPRIM) 300 MG tablet Take 300 mg by mouth daily.     [name] aspirin EC 81 MG tablet Take 81 mg by mouth daily.    . cetirizine (ZYRTEC) 10 MG tablet Take 10 mg by mouth daily.    . cholecalciferol (VITAMIN D3) 25 MCG (1000 UT) tablet Take 1,000 Units by mouth daily.    . clobetasol cream (TEMOVATE) [id number] % Apply 1 application topically 2 (two) times daily.    . Fluticasone-Umeclidin-Vilant (TRELEGY ELLIPTA) 100-62.5-25 MCG/INH AEPB Inhale 1 puff into the lungs daily. 30 each 11  . ketoconazole (NIZORAL) 2 % shampoo Apply 1 application topically 2 (two) times a week.    [name] ketorolac (ACULAR) 0.4 % SOLN Place 1 drop into both eyes 4 (four) times daily.    . Multiple Vitamin (ONE-A-DAY MENS PO) Take 1 tablet by mouth daily.    . prednisoLONE acetate (PRED FORTE) 1 % ophthalmic suspension INSTILL 1 DROP INTO LEFT EYE 4 TIMES DAILY 10 mL 3  . sodium chloride (MURO 128) 5 % ophthalmic solution Place 1 drop into the left eye 2 (two) times a day. 15 mL 5   No current facility-administered medications for this visit.     PAST MEDICAL HISTORY: Past Medical History:  Diagnosis Date  . Anemia    low iron  . Arthritis   . Colonic polyp   . Constipation   . COPD (chronic obstructive pulmonary disease) ([place])   . GERD (gastroesophageal reflux disease)   . Gout   .  History of kidney stones   . Malignant tumor of kidney ([place])   . Muscle atrophy   . Pain    LOWER BACK AND LEFT HIP - HX OF PREVIOUS LUMBAR AND CERVICAL SURGERY--PT STATES HE HAS HAD NUMBNESS IN BOTH FEET SINCE HIS BACK SURGERY  . Renal calculus   . Retinal detachment     PAST SURGICAL HISTORY: Past Surgical History:  Procedure Laterality Date  . BACK SURGERY     LUMBAR SURGERY  . CATARACT EXTRACTION Bilateral 2003   Surgeon unknown  . EYE SURGERY     BILATERAL CATARACT EXTRACTIONS  . GIVENS CAPSULE STUDY N/A 10/07/2015   Procedure: GIVENS CAPSULE STUDY;  Surgeon: Carol Ada, MD;  Location: [place];  Service: Endoscopy;  Laterality: N/A;  . HAND SURGERY     left  . KNEE SURGERY     right  . LAMINECTOMY     cervical  . LASER PHOTO ABLATION Left 08/19/2017   Procedure: LASER PHOTO ABLATION;  Surgeon: [name], MD;  Location: [place];  Service: Ophthalmology;  Laterality: Left;  . MEMBRANE PEEL Left 03/22/2017   Procedure: POSSIBLE MEMBRANE PEEL WITH SILICONE OIL AND PERFLURON;  Surgeon: [name], MD;  Location: [place];  Service: Ophthalmology;  Laterality: Left;  . NEPHRECTOMY     PT STATES RIGHT KIDNEY WAS REMOVED  . PAROTIDECTOMY Right 06/13/2018   Procedure: RIGHT TOTAL PAROTIDECTOMY;  Surgeon: Leta Baptist, MD;  Location: [place];  Service: ENT;  Laterality: Right;  . PARS PLANA VITRECTOMY Left 03/22/2017   Procedure: PARS PLANA VITRECTOMY WITH 25 GAUGE;  Surgeon: [name], MD;  Location: [facility] [facility];  Service: Ophthalmology;  Laterality: Left;  . PARS PLANA VITRECTOMY W/ SCLERAL BUCKLE Left 08/19/2017   Procedure: 36 GAUGE PARS PLANA VITRECTOMY WITH SILCONE OIL REMOVAL;  Surgeon: [name], MD;  Location: [place];  Service: Ophthalmology;  Laterality: Left;  . 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 SALIVARY STONE REMOVAL Right 02/21/2019   Procedure: RIGHT PAROTID FISTULA REPAIR;  Surgeon: Leta Baptist, MD;  Location: [facility];  Service: ENT;  Laterality: Right;  . SHOULDER SURGERY  2020   [facility]  . TOTAL KNEE ARTHROPLASTY Left 10/19/2012   Procedure: LEFT TOTAL KNEE ARTHROPLASTY;  Surgeon: Magnus Sinning, MD;  Location: [facility];  Service: Orthopedics;  Laterality: Left;  [name] VITRECTOMY 25 GAUGE WITH SCLERAL BUCKLE Left 02/17/2017   Procedure: VITRECTOMY 25 GAUGE WITH SCLERAL BUCKLE membrane peel, injection of silicone oil, and endolaser photocoagulation.;  Surgeon: [name], MD;  Location: [facility];  Service: Ophthalmology;  Laterality: Left;    FAMILY HISTORY: Family History  Problem Relation Age of Onset  . Diabetes Mother   . COPD  Brother   . COPD Sister   . Heart attack Father   . Amblyopia Neg Hx   . Blindness Neg Hx   . Cataracts Neg Hx   . Glaucoma Neg Hx   . Macular degeneration Neg Hx   . Retinal detachment Neg Hx   . Retinitis pigmentosa Neg Hx     SOCIAL HISTORY: Social History   Socioeconomic History  . Marital status: Married    Spouse name: Not on file  . Number of children: 3  . Years of education: 8th grade  . Highest education level: Not on file  Occupational History  . Occupation: Retired  Scientific laboratory technician  . Financial resource strain: Not on file  . Food insecurity    Worry: Not on  file    Inability: Not on file  . Transportation needs    Medical: Not on file    Non-medical: Not on file  Tobacco Use  . Smoking status: Former Smoker    Packs/day: 2.00    Years: 40.00    Pack years: 80.00    Types: Cigarettes, Pipe    Quit date: 06/09/1995    Years since quitting: 23.9  . Smokeless tobacco: Never Used  Substance and Sexual Activity  . Alcohol use: No  . Drug use: No  . Sexual activity: Not on file  Lifestyle  . Physical activity    Days per week: Not on file    Minutes per session: Not on file  . Stress: Not on file  Relationships  . Social Herbalist on phone: Not on file    Gets together: Not on file    Attends religious service: Not on file    Active member of club or organization: Not on file    Attends meetings of clubs or organizations: Not on file    Relationship status: Not on file  . Intimate partner violence    Fear of current or ex partner: Not on file    Emotionally abused: Not on file    Physically abused: Not on file    Forced sexual activity: Not on file  Other Topics Concern  . Not on file  Social History Narrative   Lives with wife, Jerome Mays.   Right-handed.   No daily caffeine use.        PHYSICAL EXAM   Vitals:   05/15/19 0802  BP: 117/70  Pulse: 71  Temp: (!) 97.2 F (36.2 C)  Weight: 179 lb (81.2 kg)  Height: 6\' 1"  (1.854 m)     Not recorded      Body mass index is 23.62 kg/m.  PHYSICAL EXAMNIATION:  Gen: NAD, conversant, well nourised, well groomed                     Cardiovascular: Regular rate rhythm, no peripheral edema, warm, nontender. Eyes: Conjunctivae clear without exudates or hemorrhage Neck: Supple, no carotid bruits. Pulmonary: Clear to auscultation bilaterally   NEUROLOGICAL EXAM:  MENTAL STATUS: Speech:    Speech is normal; fluent and spontaneous with normal comprehension.  Cognition:     Orientation to time, place and person     Normal recent and remote memory     Normal Attention span and concentration     Normal Language, naming, repeating,spontaneous speech     Fund of knowledge   CRANIAL NERVES: CN II: Visual fields are full to confrontation.  Pupils are round equal and briskly reactive to light. CN III, IV, VI: extraocular movement are normal. No ptosis. CN V: Facial sensation is intact to pinprick in all 3 divisions bilaterally. Corneal responses are intact.  CN VII: Face is symmetric with normal eye closure and smile. CN VIII: Hearing is normal to causal conversation. CN IX, X: Palate elevates symmetrically. Phonation is normal. CN XI: Head turning and shoulder shrug are intact CN XII: Tongue has no atrophy or muscle fasciculation, mild spastic slow tongue movement.  MOTOR: He has frequent bilateral upper and lower extremity muscle fasciculations, significant atrophy of bilateral upper extremity proximal muscles, and intrinsic hand muscles,  (R/L), shoulder abduction 2/3, elbow flexion1/3, elbow extension2/4, wrist flexion 3/4, wrist extension3/4, grip 3/4.  Finger abduction 3/4 Hip flexion 5-/5-, distal leg muscle strength is normal  REFLEXES: Reflexes  are 2+ and symmetric at the biceps, triceps, 3/3 knees, and absent at ankles. Plantar responses are extensor bilaterally  SENSORY: Length dependent decreased to light touch, pinprick, vibratory sensation to distal shin  level.   COORDINATION: There is no trunk or limb dysmetria noted.  GAIT/STANCE: He needs to push up to get up from seated position, steady   DIAGNOSTIC DATA (LABS, IMAGING, TESTING) - I reviewed patient records, labs, notes, testing and imaging myself where available.   ASSESSMENT AND PLAN  INAKY RIVES is a 78 y.o. male   Pain is muscle atrophy, fasciculation, muscle weakness, with hyperreflexia, bilateral Babinski signs,  He was noted to have upper motor neuron signs involving bulbar, cervical, lumbar myotomes.  Most suggestive of motor neuron disease  Proceed with MRI of the brain, thoracic, lumbar spine  Laboratory evaluation today  EMG nerve conduction study  MRI of cervical spine to review   Marcial Pacas, M.D. Ph.D.  [facility] [address], [place], [place] [address] Ph: [phone] Fax: [phone]  [id number], Delfino Lovett, MD, Suella Broad, MD

## 2019-05-17 ENCOUNTER — Ambulatory Visit (INDEPENDENT_AMBULATORY_CARE_PROVIDER_SITE_OTHER): Payer: Medicare HMO | Admitting: Neurology

## 2019-05-17 ENCOUNTER — Other Ambulatory Visit: Payer: Self-pay

## 2019-05-17 ENCOUNTER — Ambulatory Visit: Payer: Medicare HMO | Admitting: Neurology

## 2019-05-17 DIAGNOSIS — Z0289 Encounter for other administrative examinations: Secondary | ICD-10-CM

## 2019-05-17 DIAGNOSIS — R253 Fasciculation: Secondary | ICD-10-CM

## 2019-05-17 DIAGNOSIS — M6281 Muscle weakness (generalized): Secondary | ICD-10-CM

## 2019-05-17 DIAGNOSIS — R531 Weakness: Secondary | ICD-10-CM | POA: Diagnosis not present

## 2019-05-17 DIAGNOSIS — M6250 Muscle wasting and atrophy, not elsewhere classified, unspecified site: Secondary | ICD-10-CM

## 2019-05-17 NOTE — Progress Notes (Signed)
I have reviewed MRI of cervical report and film on April 27, 2019 from orthopedic clinic, severe bilateral C5-6 neuroforaminal narrowing with moderate to severe narrowing of the subarticular zones, potentially impingement on bilateral C6 nerve roots, mild canal stenosis, no cord signal changes  Severe bilateral C4-5 Foraminal narrowing, with moderate narrowing of the subarticular zone in, potentially impingement of bilateral C5 nerve roots  Postsurgical changes of right posterior C6-7 foraminotomies without significant right neural foraminal narrowing, moderate left neural foraminal narrowing  Moderate left, mild to moderate right T1-2 neural foraminal narrowing,  Mild to moderate right T 2 3 neural foraminal narrowing,  Mild to moderate bilateral C3-4 neural foraminal narrowing,  Mild levoconvex cervical scoliosis, straightening of normal cervical lordosis,

## 2019-05-17 NOTE — Procedures (Signed)
Full Name: Jerome Mays Gender: Male MRN #: [id number] Date of Birth: [birth year]    Visit Date: 05/17/2019 08:06 Age: 78 Years Examining Physician: Marcial Pacas, MD  Referring Physician: Marcial Pacas, MD History:    78 year old male, presented with painless progressive bilateral upper extremity weakness,  On examination:  Spastic slow tongue movement.  Frequent bilateral upper and lower extremity muscle fasciculations.  Profound bilateral upper extremity muscle atrophy and weakness, right worse than left.  No antigravity movement of right proximal upper extremity, 3 out of 4 of left upper extremity, mild bilateral hip flexion weakness, hyperreflexia of bilateral lower extremity, bilateral Babinski signs, mild length dependent sensory changes  Summary of the tests: Nerve conduction study: Right sural, superficial sensory responses were absent.  Right ulnar sensory response was normal.  Right median sensory response showed mildly prolonged peak latency with mildly decreased snap amplitude.  Right tibial motor responses were absent.  Right peroneal to EDB showed moderately prolonged distal latency, with severely decreased CMAP amplitude, and conduction velocity.  Right median and ulnar motor responses showed mildly to moderately prolonged distal latency, with severely decreased CMAP amplitude.  There is no evidence of conduction block.  Electromyography: Selected needle examinations were performed at right bulbar, upper, lower extremity muscles, right cervical, thoracic, lumbar sacral paraspinal muscles.  There was evidence of frequent muscle fasciculation, active denervation, enarge complex motor unit potential, with decreased recruitment at right cervical myotomes, right lumbosacral myotomes.  There is also evidence of chronic neuropathic changes involving right sternocleidomastoid, right orbicularis oculi and right genioglossus.  There is increased insertional activity, complex motor  unit potential at the right thoracic paraspinal muscles.  But there is no evidence of active denervation  Conclusion: This is an abnormal study.  There is electrodiagnostic evidence of widespread chronic and active neuropathic changes involving right bulbar, cervical, lumbar sacral myotomes.  There also suggestion of chronic neuropathic changes involving right thoracic paraspinal muscles.  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  Differentiation diagnoses also include  inflammatory process, nutritional deficiency, paraneoplastic syndrome, extensive evaluation was planned.  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    ------------------------------- Marcial Pacas, M.D. Ph.D.  [facility] [place], [place] [address] Tel: [phone] Fax: [phone]         [facility]    Nerve / Sites Muscle Latency Ref. Amplitude Ref. Rel Amp Segments Distance Velocity Ref. Area    ms ms mV mV %  cm m/s m/s mVms  R Median - APB     Wrist APB 4.7 ?4.4 1.1 ?4.0 100 Wrist - APB 7   2.3     Upper arm APB 11.0  1.1  105 Upper arm - Wrist 26 42 ?49 2.4  R Ulnar - ADM     Wrist ADM 3.9 ?3.3 2.1 ?6.0 100 Wrist - ADM 7   3.8     B.Elbow ADM 7.8  4.3  209 B.Elbow - Wrist 24 62 ?49 20.5     A.Elbow ADM 9.5  3.8  89.1 A.Elbow - B.Elbow 10 57 ?49 15.6     8 FDI 3.0  3.9  103 A.Elbow - Wrist    14.1  R Peroneal - EDB     Ankle EDB 7.6 ?6.5 0.5 ?2.0 100 Ankle - EDB 9   1.5     Fib head EDB 18.9  0.5  84.1 Fib head - Ankle  33 29 ?44 1.3     Pop fossa EDB 22.1  0.5  104 Pop fossa - Fib head 10 30 ?44 1.4         Pop fossa - Ankle      R Tibial - AH     Ankle AH NR ?5.8 NR ?4.0 NR Ankle - AH 9   NR             SNC    Nerve / Sites Rec. Site Peak Lat Ref.  Amp Ref. Segments Distance Peak Diff Ref.    ms ms V V  cm ms ms  R Sural - Ankle (Calf)     Calf Ankle NR ?4.4 NR ?6 Calf - Ankle 14    R  Superficial peroneal - Ankle     Lat leg Ankle NR ?4.4 NR ?6 Lat leg - Ankle 14    R Median, Ulnar - Transcarpal comparison     Median Palm Wrist 2.4 ?2.2 42 ?35 Median Palm - Wrist 8       Ulnar Palm Wrist 2.3 ?2.2 29 ?12 Ulnar Palm - Wrist 8          Median Palm - Ulnar Palm  0.0 ?0.4  R Median - Orthodromic (Dig II, Mid palm)     Dig II Wrist 3.6 ?3.4 6 ?10 Dig II - Wrist 13    R Ulnar - Orthodromic, (Dig V, Mid palm)     Dig V Wrist 3.1 ?3.1 6 ?5 Dig V - Wrist 72                 F  Wave    Nerve F Lat Ref.   ms ms  R Median - APB 34.5 ?31.0  R Ulnar - ADM 33.9 ?32.0  R Peroneal - EDB  ?56.0           EMG Summary Table    Spontaneous MUAP Recruitment  Muscle IA Fib PSW Fasc Other Amp Dur. Poly Pattern  R. First dorsal interosseous Increased 2+ None None _______ Increased Increased Normal Reduced  R. Pronator teres Increased None None None _______ Increased Normal Normal Reduced  R. Biceps brachii Increased None None None _______ Increased Increased Normal Reduced  R. Deltoid Increased None None None _______ Increased Normal Normal Reduced  R. Triceps brachii Increased None None None _______ Increased Increased Normal Reduced  R. Tibialis anterior Increased 1+ None None _______ Increased Increased Normal Reduced  R. Gastrocnemius (Medial head) Increased None None None _______ Increased Increased Normal Reduced  R. Lumbar paraspinals Increased None None None _______ Normal Normal Normal Normal  R. Vastus lateralis Increased None None None _______ Normal Normal Normal Reduced  R. Sternocleidomastoid Increased None None None _______ Increased Increased Normal Reduced  R. Orbicularis oculi Increased None None None _______ Increased Increased 1+ Reduced  R. Genioglossus Increased None None None _______ Increased Increased Normal Reduced  R. Cervical paraspinals Normal None None None _______ Normal Normal Normal Normal  R. Thoracic paraspinals (mid) Increased None None None _______  Increased Increased Normal Normal  R. Thoracic paraspinals (low) Increased None None None _______ Increased Increased 1+ Normal  R. Lumbar paraspinals (mid) Increased None None None _______ Increased Increased 1+ Normal  R. Lumbar paraspinals (low) Increased None None None _______ Increased Increased 1+ Normal

## 2019-05-18 ENCOUNTER — Telehealth: Payer: Self-pay | Admitting: Neurology

## 2019-05-18 LAB — COMPREHENSIVE METABOLIC PANEL
ALT: 18 IU/L (ref 0–44)
AST: 25 IU/L (ref 0–40)
Albumin/Globulin Ratio: 1.6 (ref 1.2–2.2)
Albumin: 4.3 g/dL (ref 3.7–4.7)
Alkaline Phosphatase: 76 IU/L (ref 39–117)
BUN/Creatinine Ratio: 18 (ref 10–24)
BUN: 18 mg/dL (ref 8–27)
Bilirubin Total: 0.5 mg/dL (ref 0.0–1.2)
CO2: 20 mmol/L (ref 20–29)
Calcium: 9.8 mg/dL (ref 8.6–10.2)
Chloride: 108 mmol/L — ABNORMAL HIGH (ref 96–106)
Creatinine, Ser: 1.01 mg/dL (ref 0.76–1.27)
GFR calc Af Amer: 81 mL/min/{1.73_m2} (ref >59)
GFR calc non Af Amer: 70 mL/min/{1.73_m2} (ref >59)
Globulin, Total: 2.7 g/dL (ref 1.5–4.5)
Glucose: 104 mg/dL — ABNORMAL HIGH (ref 65–99)
Potassium: 4.6 mmol/L (ref 3.5–5.2)
Sodium: 141 mmol/L (ref 134–144)
Total Protein: 7 g/dL (ref 6.0–8.5)

## 2019-05-18 LAB — CBC WITH DIFFERENTIAL/PLATELET
Basophils Absolute: 0.1 10*3/uL (ref 0.0–0.2)
Basos: 1 %
EOS (ABSOLUTE): 0.3 10*3/uL (ref 0.0–0.4)
Eos: 4 %
Hematocrit: 41.6 % (ref 37.5–51.0)
Hemoglobin: 12.5 g/dL — ABNORMAL LOW (ref 13.0–17.7)
Immature Grans (Abs): 0 10*3/uL (ref 0.0–0.1)
Immature Granulocytes: 0 %
Lymphocytes Absolute: 1.4 10*3/uL (ref 0.7–3.1)
Lymphs: 19 %
MCH: 25.5 pg — ABNORMAL LOW (ref 26.6–33.0)
MCHC: 30 g/dL — ABNORMAL LOW (ref 31.5–35.7)
MCV: 85 fL (ref 79–97)
Monocytes Absolute: 0.5 10*3/uL (ref 0.1–0.9)
Monocytes: 7 %
Neutrophils Absolute: 5.4 10*3/uL (ref 1.4–7.0)
Neutrophils: 69 %
Platelets: 180 10*3/uL (ref 150–450)
RBC: 4.9 x10E6/uL (ref 4.14–5.80)
RDW: 15.4 % (ref 11.6–15.4)
WBC: 7.7 10*3/uL (ref 3.4–10.8)

## 2019-05-18 LAB — PROTEIN ELECTROPHORESIS
A/G Ratio: 1.1 (ref 0.7–1.7)
Albumin ELP: 3.7 g/dL (ref 2.9–4.4)
Alpha 1: 0.2 g/dL (ref 0.0–0.4)
Alpha 2: 0.7 g/dL (ref 0.4–1.0)
Beta: 1.2 g/dL (ref 0.7–1.3)
Gamma Globulin: 1.1 g/dL (ref 0.4–1.8)
Globulin, Total: 3.3 g/dL (ref 2.2–3.9)
M-Spike, %: 0.1 g/dL — ABNORMAL HIGH

## 2019-05-18 LAB — HEPATITIS PANEL, ACUTE
Hep A IgM: NEGATIVE
Hep B C IgM: NEGATIVE
Hep C Virus Ab: 0.1 s/co ratio (ref 0.0–0.9)
Hepatitis B Surface Ag: NEGATIVE

## 2019-05-18 LAB — RPR: RPR Ser Ql: NONREACTIVE

## 2019-05-18 LAB — ANA W/REFLEX IF POSITIVE
Anti JO-1: <0.2 AI (ref 0.0–0.9)
Anti Nuclear Antibody (ANA): POSITIVE — AB
Centromere Ab Screen: <0.2 AI (ref 0.0–0.9)
Chromatin Ab SerPl-aCnc: <0.2 AI (ref 0.0–0.9)
ENA RNP Ab: <0.2 AI (ref 0.0–0.9)
ENA SM Ab Ser-aCnc: 0.4 AI (ref 0.0–0.9)
ENA SSA (RO) Ab: 5.1 AI — ABNORMAL HIGH (ref 0.0–0.9)
ENA SSB (LA) Ab: <0.2 AI (ref 0.0–0.9)
Scleroderma (Scl-70) (ENA) Antibody, IgG: <0.2 AI (ref 0.0–0.9)
dsDNA Ab: 11 IU/mL — ABNORMAL HIGH (ref 0–9)

## 2019-05-18 LAB — FOLATE: Folate: >20 ng/mL (ref >3.0)

## 2019-05-18 LAB — COPPER, SERUM: Copper: 157 ug/dL (ref 72–166)

## 2019-05-18 LAB — ACETYLCHOLINE RECEPTOR, BINDING: AChR Binding Ab, Serum: 0.03 nmol/L (ref 0.00–0.24)

## 2019-05-18 LAB — CK: Total CK: 128 U/L (ref 41–331)

## 2019-05-18 LAB — SEDIMENTATION RATE: Sed Rate: 26 mm/hr (ref 0–30)

## 2019-05-18 LAB — C-REACTIVE PROTEIN: CRP: 2 mg/L (ref 0–10)

## 2019-05-18 LAB — FERRITIN: Ferritin: 23 ng/mL — ABNORMAL LOW (ref 30–400)

## 2019-05-18 LAB — TSH: TSH: 5.68 u[IU]/mL — ABNORMAL HIGH (ref 0.450–4.500)

## 2019-05-18 LAB — VITAMIN D 25 HYDROXY (VIT D DEFICIENCY, FRACTURES): Vit D, 25-Hydroxy: 50.8 ng/mL (ref 30.0–100.0)

## 2019-05-18 LAB — HIV ANTIBODY (ROUTINE TESTING W REFLEX): HIV Screen 4th Generation wRfx: NONREACTIVE

## 2019-05-18 LAB — VITAMIN B12: Vitamin B-12: 387 pg/mL (ref 232–1245)

## 2019-05-18 NOTE — Telephone Encounter (Signed)
Pt's wife returned call. Please call back when available.  °

## 2019-05-18 NOTE — Telephone Encounter (Signed)
Please call patient, laboratory evaluations showed few slight abnormalities, none of those findings would explain his current difficulties.  Mild anemia, hemoglobin of 12.5, normal being 13.  Ferritin was mildly decreased 23  Ask him to see if he has any tarry, black stool, one was last time he had colonoscopy   Positive ANA, with positive SSA, mild positive double-stranded DNA, ask him if he has any dry eyes, dry mouth, above findings clearly indicate Sjogren's disease  Electrophoresis showed very low titer M spike protein 0.1, may consider repeat laboratory evaluation later  Elevated TSH 5.68, this could indicate hypothyroidism, will repeat thyroid functional test later  Rest of the laboratory evaluation showed no significant abnormalities  Please give him a follow up visit with me in Jan, 2021 after MRIs.

## 2019-05-18 NOTE — Telephone Encounter (Signed)
Left message on his voicemail requesting a return call.  Also, tried his wife's number but no answer and mailbox was full.

## 2019-05-18 NOTE — Telephone Encounter (Signed)
I spoke to the patient's wife on DPR.  The lab results below were reviewed in detail.  She verbalized understanding.  Says he had his colonoscopy this year. They have not noticed any dark, tarry stools. Dr. Krista Blue has been notified of this information.  The patient does have dry eyes and dry mouth.  Per vo by Dr. Krista Blue, he should continue to use his eye drops, increase his water intake and suck on sugar free hard candy.    The patient's wife is aware to expect a call from [facility] to get his scans scheduled.  She made his follow up on 07/06/2019. His daughter will be with them at this appt.  She will address his medical concerns, MRI and lab results at this appt.  The patient wife was provided our number in case she has concerns or questions prior to his next appt.

## 2019-06-09 DIAGNOSIS — G1221 Amyotrophic lateral sclerosis: Secondary | ICD-10-CM

## 2019-06-09 HISTORY — DX: Amyotrophic lateral sclerosis: G12.21

## 2019-06-10 ENCOUNTER — Other Ambulatory Visit (INDEPENDENT_AMBULATORY_CARE_PROVIDER_SITE_OTHER): Payer: Self-pay | Admitting: Ophthalmology

## 2019-06-12 DIAGNOSIS — D119 Benign neoplasm of major salivary gland, unspecified: Secondary | ICD-10-CM | POA: Diagnosis not present

## 2019-06-17 ENCOUNTER — Ambulatory Visit
Admission: RE | Admit: 2019-06-17 | Discharge: 2019-06-17 | Disposition: A | Discharge status: Home / Self Care | Payer: Medicare HMO | Source: Ambulatory Visit | Attending: Neurology | Admitting: Neurology

## 2019-06-17 ENCOUNTER — Other Ambulatory Visit: Payer: Self-pay

## 2019-06-17 DIAGNOSIS — M6281 Muscle weakness (generalized): Secondary | ICD-10-CM | POA: Diagnosis not present

## 2019-06-19 ENCOUNTER — Telehealth: Payer: Self-pay | Admitting: Neurology

## 2019-06-19 NOTE — Telephone Encounter (Signed)
-----   Message from Desmond Lope, RN sent at 06/19/2019  8:53 AM EST -----  ----- Message ----- From: Britt Bottom, MD Sent: 06/18/2019   5:51 PM EST To: Marcial Pacas, MD

## 2019-06-19 NOTE — Telephone Encounter (Signed)
I called the patient, talk with the wife.  MRI of the brain shows some mild atrophy, small vessel disease that is mild.  MRI of the lumbar spine does show some areas of spinal stenosis, worse at the L3-4 level, but by my reading does not appear to be a critical spinal stenosis.  Thoracic spine MRI was unremarkable.  The patient has spasticity on clinical examination, has multilevel abnormalities by EMG including bulbar muscles.  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   MRI brain 06/18/19:  IMPRESSION: This MRI of the brain without contrast shows the following: 1.    Mild generalized cortical atrophy. 2.    T2/flair hyperintense foci in the hemispheres consistent with mild chronic microvascular ischemic changes. 3.    There are no acute findings.     MRI lumbar 06/18/19:  IMPRESSION: This MRI of the lumbar spine shows multilevel degenerative changes as detailed above.  The most significant findings are: 1.   At L2-L3, there is mild spinal stenosis causing moderately severe bilateral foraminal narrowing and right lateral recess stenosis.  There is potential for compression of either of the L2 nerve roots in the right L3 nerve roots. 2.   At L3-L4, there is severe spinal stenosis in the transverse diameter causing moderate bilateral foraminal narrowing and moderately severe bilateral lateral recess stenosis with potential for compression of the L4 nerve roots 3.   At L4-L5, there are postoperative changes in lateral recess stenosis but no nerve root compression or spinal stenosis. 4.   At L5-S1, there is mild to moderate spinal stenosis in the transverse diameter due to anterolisthesis and other degenerative changes including severe facet hypertrophy.  There does not appear to be any nerve root compression at this level. 5.   Compared to the MRI dated 05/29/2016, there are no significant changes.    MR thoracic 06/18/19:  IMPRESSION: This MRI of the thoracic  spine without contrast shows the following: 1.   The spinal cord appears normal. 2.   Degenerative changes as described above that do not lead to spinal stenosis or nerve root compression.

## 2019-06-26 ENCOUNTER — Telehealth: Payer: Self-pay | Admitting: Neurology

## 2019-06-26 NOTE — Telephone Encounter (Signed)
Pt's wife called wanting to know if the pt is ok to get the Covid vaccination. Please advise.

## 2019-06-26 NOTE — Telephone Encounter (Signed)
Per vo by Dr. Felecia Shelling, it is okay for this patient to get the COVID-19 vaccination.  I returned the call back to his wife and she is aware of his response.

## 2019-07-06 ENCOUNTER — Encounter: Payer: Self-pay | Admitting: Neurology

## 2019-07-06 ENCOUNTER — Ambulatory Visit: Payer: Medicare HMO | Admitting: Neurology

## 2019-07-06 ENCOUNTER — Other Ambulatory Visit: Payer: Self-pay

## 2019-07-06 VITALS — BP 128/73 | HR 71 | Temp 97.6°F | Ht 73.0 in | Wt 182.0 lb

## 2019-07-06 DIAGNOSIS — G1220 Motor neuron disease, unspecified: Secondary | ICD-10-CM | POA: Diagnosis not present

## 2019-07-06 MED ORDER — RILUZOLE 50 MG PO TABS: 50.0000 mg | ORAL_TABLET | Freq: Two times a day (BID) | ORAL | 11 refills | Status: AC

## 2019-07-06 NOTE — Progress Notes (Signed)
PATIENT: Jerome Mays DOB: 12-17-1940  Chief Complaint  Patient presents with  . Extremity Weakness    He is here with his wife, Jerome Mays and daughter,. The would like to review his test results and discuss his diagnosis.     HISTORICAL  Jerome Mays is a 79 year old male, seen in request by his primary care physician Jerome Mays, and pain management Jerome Mays, Jerome Mays, for evaluation of gradual onset of muscle atrophy, he is accompanied by his wife at today's visit on May 15, 2019.  I have reviewed and summarized the referring note from the referring physician.  He was previously fairly healthy, in 2019, he noticed gradual onset difficulty raising right arm overhead, but he denies significant right shoulder pain, he was diagnosed with right rotator cuff surgery, had surgery, which failed to improve his right arm weakness, since summer of 2020, he also noticed gradual onset left arm weakness, progressively worse, over the past couple years, he had frequent bilateral proximal thigh muscle cramping, but he denies sensory changes, denies bilateral lower extremity weakness, he denies significant dysarthria, no dysphagia, he denies significant difficulty breathing, but he tends to sleep in a sitting up position at nighttime due to bilateral arm weakness  He also had 10 pound weight loss over past 1 year,  UPDATE Jan 28th 2021: He is accompanied by his daughter and wife at today's clinic visit  I personally reviewed MRI of the brain, mild generalized atrophy, supratentorium small vessel disease MRI of thoracic spine multilevel degenerative changes, no significant canal or foraminal narrowing MRI of lumbar spine, multilevel degenerative changes, severe spinal stenosis in transverse diameter at L3-4, causing moderate bilateral foraminal narrowing, moderately severe bilateral lateral recess stenosis with potential compression of L4 nerve roots, postsurgical change L4-5, but there was  no significant compression; L5-S1, mild to moderate spinal stenosis, hypertrophy,  MRI cervical report from [facility], Postsurgical changes of right posterior C6-7 foraminotomies without significant right neural foraminal narrowing, moderate left neural foraminal narrowing  Patient continues complain slow decline, noticed more difficulty with his left arm, denies significant gait abnormality, denies swallowing difficulty, he has COPD, shortness of breath with exertion  Laboratory evaluation showed negative acetylcholine receptor binding antibody, CBC showed hemoglobin of 12.5, CMP was normal, normal vitamin D, copper, positive ANA, with double-stranded DNA, SSA, ferritin was mildly decreased 23, mildly elevated M spike 0.1, normal CPK, ESR, C-reactive protein, folic acid, HIV RPR, M35 slightly elevated TSH 5.7  REVIEW OF SYSTEMS: Full 14 system review of systems performed and notable only for as above All other review of systems were negative.  ALLERGIES: Allergies  Allergen Reactions  . Lipitor [Atorvastatin] Rash and Other (See Comments)    Passed out    HOME MEDICATIONS: Current Outpatient Medications  Medication Sig Dispense Refill  . allopurinol (ZYLOPRIM) 300 MG tablet Take 300 mg by mouth daily.     [name] aspirin EC 81 MG tablet Take 81 mg by mouth daily.    . cetirizine (ZYRTEC) 10 MG tablet Take 10 mg by mouth daily.    . cholecalciferol (VITAMIN D3) 25 MCG (1000 UT) tablet Take 1,000 Units by mouth daily.    . clobetasol cream (TEMOVATE) 3.61 % Apply 1 application topically 2 (two) times daily.    . Fluticasone-Umeclidin-Vilant (TRELEGY ELLIPTA) 100-62.5-25 MCG/INH AEPB Inhale 1 puff into the lungs daily. 30 each 11  . ketoconazole (NIZORAL) 2 % shampoo Apply 1 application topically 2 (two) times a week.    [name]  ketorolac (ACULAR) 0.4 % SOLN Place 1 drop into both eyes 4 (four) times daily.    [name] ketorolac (ACULAR) 0.5 % ophthalmic solution INSTILL 1 DROP INTO LEFT EYE 4  TIMES DAILY 10 mL 0  . Multiple Vitamin (ONE-A-DAY MENS PO) Take 1 tablet by mouth daily.    . prednisoLONE acetate (PRED FORTE) 1 % ophthalmic suspension INSTILL 1 DROP INTO LEFT EYE 4 TIMES DAILY 10 mL 3  . sodium chloride (MURO 128) 5 % ophthalmic solution Place 1 drop into the left eye 2 (two) times a day. 15 mL 5   No current facility-administered medications for this visit.    PAST MEDICAL HISTORY: Past Medical History:  Diagnosis Date  . Anemia    low iron  . Arthritis   . Colonic polyp   . Constipation   . COPD (chronic obstructive pulmonary disease) ([place])   . GERD (gastroesophageal reflux disease)   . Gout   . History of kidney stones   . Malignant tumor of kidney ([place])   . Muscle atrophy   . Pain    LOWER BACK AND LEFT HIP - HX OF PREVIOUS LUMBAR AND CERVICAL SURGERY--PT STATES HE HAS HAD NUMBNESS IN BOTH FEET SINCE HIS BACK SURGERY  . Renal calculus   . Retinal detachment     PAST SURGICAL HISTORY: Past Surgical History:  Procedure Laterality Date  . BACK SURGERY     LUMBAR SURGERY  . CATARACT EXTRACTION Bilateral 2003   Surgeon unknown  . EYE SURGERY     BILATERAL CATARACT EXTRACTIONS  . GIVENS CAPSULE STUDY N/A 10/07/2015   Procedure: GIVENS CAPSULE STUDY;  Surgeon: Carol Ada, MD;  Location: [facility];  Service: Endoscopy;  Laterality: N/A;  . HAND SURGERY     left  . KNEE SURGERY     right  . LAMINECTOMY     cervical  . LASER PHOTO ABLATION Left 08/19/2017   Procedure: LASER PHOTO ABLATION;  Surgeon: [name], MD;  Location: [facility];  Service: Ophthalmology;  Laterality: Left;  . MEMBRANE PEEL Left 03/22/2017   Procedure: POSSIBLE MEMBRANE PEEL WITH SILICONE OIL AND PERFLURON;  Surgeon: [name], MD;  Location: [facility];  Service: Ophthalmology;  Laterality: Left;  . NEPHRECTOMY     PT STATES RIGHT KIDNEY WAS REMOVED  . PAROTIDECTOMY Right 06/13/2018   Procedure: RIGHT TOTAL PAROTIDECTOMY;  Surgeon: Leta Baptist, MD;  Location: [facility];  Service: ENT;  Laterality: Right;  . PARS PLANA VITRECTOMY Left 03/22/2017   Procedure: PARS PLANA VITRECTOMY WITH 25 GAUGE;  Surgeon: [name], MD;  Location: [facility];  Service: Ophthalmology;  Laterality: Left;  . PARS PLANA VITRECTOMY W/ SCLERAL BUCKLE Left 08/19/2017   Procedure: 74 GAUGE PARS PLANA VITRECTOMY WITH SILCONE OIL REMOVAL;  Surgeon: [name], MD;  Location: [facility];  Service: Ophthalmology;  Laterality: Left;  . 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 SALIVARY STONE REMOVAL Right 02/21/2019   Procedure: RIGHT PAROTID FISTULA REPAIR;  Surgeon: Leta Baptist, MD;  Location: [facility];  Service: ENT;  Laterality: Right;  . SHOULDER SURGERY  2020   [facility]  . TOTAL KNEE ARTHROPLASTY Left 10/19/2012   Procedure: LEFT TOTAL KNEE ARTHROPLASTY;  Surgeon: Magnus Sinning, MD;  Location: [facility];  Service: Orthopedics;  Laterality: Left;  [name] VITRECTOMY 25 GAUGE WITH SCLERAL BUCKLE Left 02/17/2017   Procedure: VITRECTOMY 25 GAUGE WITH SCLERAL BUCKLE membrane peel, injection of silicone oil, and endolaser photocoagulation.;  Surgeon: [name],  MD;  Location: [facility];  Service: Ophthalmology;  Laterality: Left;    FAMILY HISTORY: Family History  Problem Relation Age of Onset  . Diabetes Mother   . COPD Brother   . COPD Sister   . Heart attack Father   . Amblyopia Neg Hx   . Blindness Neg Hx   . Cataracts Neg Hx   . Glaucoma Neg Hx   . Macular degeneration Neg Hx   . Retinal detachment Neg Hx   . Retinitis pigmentosa Neg Hx     SOCIAL HISTORY: Social History   Socioeconomic History  . Marital status: Married    Spouse name: Not on file  . Number of children: 3  . Years of education: 8th grade  . Highest education level: Not on file  Occupational History  . Occupation: Retired  Tobacco Use  . Smoking status: Former Smoker    Packs/day: 2.00    Years: 40.00    Pack years: 80.00    Types: Cigarettes, Pipe    Quit date: 06/09/1995    Years since  quitting: 24.0  . Smokeless tobacco: Never Used  Substance and Sexual Activity  . Alcohol use: No  . Drug use: No  . Sexual activity: Not on file  Other Topics Concern  . Not on file  Social History Narrative   Lives with wife, Jerome Mays.   Right-handed.   No daily caffeine use.      Social Determinants of Health   Financial Resource Strain:   . Difficulty of Paying Living Expenses: Not on file  Food Insecurity:   . Worried About Charity fundraiser in the Last Year: Not on file  . Ran Out of Food in the Last Year: Not on file  Transportation Needs:   . Lack of Transportation (Medical): Not on file  . Lack of Transportation (Non-Medical): Not on file  Physical Activity:   . Days of Exercise per Week: Not on file  . Minutes of Exercise per Session: Not on file  Stress:   . Feeling of Stress : Not on file  Social Connections:   . Frequency of Communication with Friends and Family: Not on file  . Frequency of Social Gatherings with Friends and Family: Not on file  . Attends Religious Services: Not on file  . Active Member of Clubs or Organizations: Not on file  . Attends Archivist Meetings: Not on file  . Marital Status: Not on file  Intimate Partner Violence:   . Fear of Current or Ex-Partner: Not on file  . Emotionally Abused: Not on file  . Physically Abused: Not on file  . Sexually Abused: Not on file     PHYSICAL EXAM   Vitals:   07/06/19 0813  BP: 128/73  Pulse: 71  Temp: 97.6 F (36.4 C)  Weight: 182 lb (82.6 kg)  Height: _0  (1.854 m)    Not recorded      Body mass index is 24.01 kg/m.  PHYSICAL EXAMNIATION:  Gen: NAD, conversant, well nourised, well groomed                     Cardiovascular: Regular rate rhythm, no peripheral edema, warm, nontender. Eyes: Conjunctivae clear without exudates or hemorrhage Neck: Supple, no carotid bruits. Pulmonary: Clear to auscultation bilaterally   NEUROLOGICAL EXAM:  MENTAL STATUS: Speech:     Mildly slow spastic speech; fluent and spontaneous with normal comprehension.  Cognition:     Orientation to time,  place and person     Normal recent and remote memory     Normal Attention span and concentration     Normal Language, naming, repeating,spontaneous speech     Fund of knowledge   CRANIAL NERVES: CN II: Visual fields are full to confrontation.  Pupils are round equal and briskly reactive to light. CN III, IV, VI: extraocular movement are normal. No ptosis. CN V: Facial sensation is intact to pinprick in all 3 divisions bilaterally. CN VII: Face is symmetric with normal eye closure and smile.  He has mild cheek puff weakness CN VIII: Hearing is normal to causal conversation. CN IX, X: Palate elevates symmetrically. Phonation is normal. CN XI: Head turning and shoulder shrug are intact CN XII: Tongue has no atrophy or muscle fasciculation, mild spastic slow tongue movement.  MOTOR: He has frequent bilateral upper and lower extremity muscle fasciculations, significant atrophy of bilateral upper extremity proximal muscles, and intrinsic hand muscles,  (R/L), shoulder abduction 2/3, elbow flexion1/3, elbow extension 2/4, wrist flexion 3/4, wrist extension3/4, grip 3/4.  Finger abduction 3/4 Hip flexion 5-/5-, distal leg muscle strength is normal  REFLEXES: Reflexes are 2+ and symmetric at the biceps, triceps, 3/3 knees, and absent at ankles. Plantar responses are extensor bilaterally  SENSORY: Length dependent decreased to light touch, pinprick, vibratory sensation to distal shin level.   COORDINATION: There is no trunk or limb dysmetria noted.  GAIT/STANCE: He can get up from seated position arms crossed, steady   DIAGNOSTIC DATA (LABS, IMAGING, TESTING) - I reviewed patient records, labs, notes, testing and imaging myself where available.   ASSESSMENT AND PLAN  JIBRAN CROOKSHANKS is a 79 y.o. male   Motor neuron disease,  Confirmed by abnormal EMG nerve conduction  study,  Extensive imaging study of neuro axis failed to explain widespread chronic neuropathic changes involving bulbar, cervical, lumbar sacral myotomes, there also suggestion of chronic thoracic myotome changes.  Laboratory evaluation also failed to demonstrate etiology   I had extensive discussion with patient, and his family, he wants to stay with our practice at this point  Started with Rilutek 50 mg twice a day  Return to clinic in 3 months   Total time spent reviewing the chart, obtaining history, examined patient, ordering tests, documentation, consultations and family, care coordination was 36  minutes     Marcial Pacas, M.D. Ph.D.  [facility] [address], [place], [place] [address] Ph: [phone] Fax: [phone]  RD:EYCXKGY, Jerome Lovett, MD, Suella Broad, MD

## 2019-07-10 ENCOUNTER — Telehealth: Payer: Self-pay | Admitting: Pulmonary Disease

## 2019-07-10 NOTE — Telephone Encounter (Signed)
FYI - called pt to give him CT appt info for Feb and he wanted to let PM know he has been diagnosed with ALS.  Wife said just wanted him to know.

## 2019-07-10 NOTE — Telephone Encounter (Signed)
Routing to Dr. Mannam as an FYI. 

## 2019-07-17 ENCOUNTER — Telehealth: Payer: Self-pay | Admitting: Neurology

## 2019-07-17 DIAGNOSIS — G1220 Motor neuron disease, unspecified: Secondary | ICD-10-CM

## 2019-07-17 NOTE — Telephone Encounter (Signed)
Patients wife [name] called in and stated she would like a call back to discuss a possible second opinion

## 2019-07-17 NOTE — Telephone Encounter (Signed)
I spoke to the patient's wife. Jerome Mays would like to remain a patient of Dr. Krista Blue but get a second opinion from [place]. They prefer to see Dr. Franchot Mimes.  Per vo by Dr. Krista Blue, okay to provide referral to [facility], Dr. Franchot Mimes.  The patient's wife is aware to expect a call from [facility] to schedule the appt.

## 2019-07-25 ENCOUNTER — Ambulatory Visit (HOSPITAL_COMMUNITY)
Admission: RE | Admit: 2019-07-25 | Discharge: 2019-07-25 | Disposition: A | Discharge status: Home / Self Care | Payer: Medicare HMO | Source: Ambulatory Visit | Attending: Pulmonary Disease | Admitting: Pulmonary Disease

## 2019-07-25 ENCOUNTER — Other Ambulatory Visit: Payer: Self-pay

## 2019-07-25 DIAGNOSIS — R918 Other nonspecific abnormal finding of lung field: Secondary | ICD-10-CM

## 2019-07-25 DIAGNOSIS — R0602 Shortness of breath: Secondary | ICD-10-CM | POA: Diagnosis not present

## 2019-07-26 ENCOUNTER — Other Ambulatory Visit: Payer: Self-pay | Admitting: General Surgery

## 2019-07-26 DIAGNOSIS — J449 Chronic obstructive pulmonary disease, unspecified: Secondary | ICD-10-CM

## 2019-07-26 DIAGNOSIS — R918 Other nonspecific abnormal finding of lung field: Secondary | ICD-10-CM

## 2019-07-31 NOTE — Telephone Encounter (Signed)
Mrs Breach(wife on [place]) has called asking if RN or Dr Krista Blue has heard from [facility] in response to the records from [facility] being sent to them re: pt.  Please call

## 2019-08-01 NOTE — Telephone Encounter (Signed)
I have called Dr. Ellwood Handler office times 3 I will call them back 08/02/2019 . Thanks Hinton Dyer

## 2019-08-03 NOTE — Telephone Encounter (Signed)
I finally got in touch with [facility] and they asked me to send referral to another fax number [phone]

## 2019-08-07 NOTE — Telephone Encounter (Signed)
I called and left Patient's wife a message stating that [facility] will call them about referral doctor has to approve status . I cant schedule the apt

## 2019-08-07 NOTE — Telephone Encounter (Signed)
Pt's wife called the office and left a VM asking for a call back. I called pt's wife. She is wondering if we have heard anything from [facility] regarding the second opinion because they have not heard anything and would like a call back.

## 2019-08-10 ENCOUNTER — Encounter: Payer: Self-pay | Admitting: Cardiovascular Disease

## 2019-08-10 DIAGNOSIS — Z125 Encounter for screening for malignant neoplasm of prostate: Secondary | ICD-10-CM | POA: Diagnosis not present

## 2019-08-10 DIAGNOSIS — Z0000 Encounter for general adult medical examination without abnormal findings: Secondary | ICD-10-CM | POA: Diagnosis not present

## 2019-08-10 DIAGNOSIS — M109 Gout, unspecified: Secondary | ICD-10-CM | POA: Diagnosis not present

## 2019-08-10 DIAGNOSIS — E7439 Other disorders of intestinal carbohydrate absorption: Secondary | ICD-10-CM | POA: Diagnosis not present

## 2019-08-10 DIAGNOSIS — R946 Abnormal results of thyroid function studies: Secondary | ICD-10-CM | POA: Diagnosis not present

## 2019-08-10 DIAGNOSIS — R7989 Other specified abnormal findings of blood chemistry: Secondary | ICD-10-CM | POA: Diagnosis not present

## 2019-08-17 DIAGNOSIS — R82998 Other abnormal findings in urine: Secondary | ICD-10-CM | POA: Diagnosis not present

## 2019-08-17 DIAGNOSIS — J309 Allergic rhinitis, unspecified: Secondary | ICD-10-CM | POA: Diagnosis not present

## 2019-08-17 DIAGNOSIS — M109 Gout, unspecified: Secondary | ICD-10-CM | POA: Diagnosis not present

## 2019-08-17 DIAGNOSIS — G1221 Amyotrophic lateral sclerosis: Secondary | ICD-10-CM | POA: Diagnosis not present

## 2019-08-17 DIAGNOSIS — Z1331 Encounter for screening for depression: Secondary | ICD-10-CM | POA: Diagnosis not present

## 2019-08-17 DIAGNOSIS — R946 Abnormal results of thyroid function studies: Secondary | ICD-10-CM | POA: Diagnosis not present

## 2019-08-17 DIAGNOSIS — J449 Chronic obstructive pulmonary disease, unspecified: Secondary | ICD-10-CM | POA: Diagnosis not present

## 2019-08-17 DIAGNOSIS — Z1339 Encounter for screening examination for other mental health and behavioral disorders: Secondary | ICD-10-CM | POA: Diagnosis not present

## 2019-08-17 DIAGNOSIS — Z0000 Encounter for general adult medical examination without abnormal findings: Secondary | ICD-10-CM | POA: Diagnosis not present

## 2019-08-17 DIAGNOSIS — M1990 Unspecified osteoarthritis, unspecified site: Secondary | ICD-10-CM | POA: Diagnosis not present

## 2019-08-28 NOTE — Progress Notes (Signed)
. [facility] Note  08/29/2019     CHIEF COMPLAINT Patient presents for Retina Follow Up   HISTORY OF PRESENT ILLNESS: Jerome Mays is a 79 y.o. male who presents to the clinic today for:   HPI    Retina Follow Up    Patient presents with  Retinal Break/Detachment.  In left eye.  Severity is moderate.  Duration of 6 weeks.  Since onset it is stable.  I, the attending physician,  performed the HPI with the patient and updated documentation appropriately.          Comments    Patient states vision the same OU. Using Pred Forte and muro 128 qid OS. Using ketorolac tid OS.        Last edited by [name], MD on 08/29/2019  8:04 AM. (History)    Patient lost to follow- up from 09.23.20 to today, 03.23.21. 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 Severe limitation of shoulder movement in both shoulders. Put on new medication (?name)  to slow down progression of disease. Patient states vision about the same OU. Patient denies eye pain.   Referring physician: [name], MD [place],   [id number]  HISTORICAL INFORMATION:   Selected notes from the MEDICAL RECORD NUMBER Initial referral from Dr. [name] for RD OS   CURRENT MEDICATIONS: Current Outpatient Medications (Ophthalmic Drugs)  Medication Sig  . ketorolac (ACULAR) 0.4 % SOLN Place 1 drop into both eyes in the morning, at noon, and at bedtime.   [name] ketorolac (ACULAR) 0.5 % ophthalmic solution INSTILL 1 DROP INTO LEFT EYE 4 TIMES DAILY (Patient taking differently: Place 1 drop into the left eye in the morning, at noon, and at bedtime. )  . prednisoLONE acetate (PRED FORTE) 1 % ophthalmic suspension INSTILL 1 DROP INTO LEFT EYE 4 TIMES DAILY  . sodium chloride (MURO 128) 5 % ophthalmic solution Place 1 drop into the left eye 2 (two) times a day. (Patient taking differently: Place 1 drop into the left eye in the morning, at noon, in the evening, and at bedtime. )  . ketorolac  (ACULAR) 0.5 % ophthalmic solution Place 1 drop into the left eye 4 (four) times daily.  . prednisoLONE acetate (PRED FORTE) 1 % ophthalmic suspension Place 1 drop into the left eye 4 (four) times daily.   No current facility-administered medications for this visit. (Ophthalmic Drugs)   Current Outpatient Medications (Other)  Medication Sig  . allopurinol (ZYLOPRIM) 300 MG tablet Take 300 mg by mouth daily.   [name] aspirin EC 81 MG tablet Take 81 mg by mouth daily.  . cetirizine (ZYRTEC) 10 MG tablet Take 10 mg by mouth daily.  . cholecalciferol (VITAMIN D3) 25 MCG (1000 UT) tablet Take 1,000 Units by mouth daily.  . clobetasol cream (TEMOVATE) 8.91 % Apply 1 application topically 2 (two) times daily.  . Fluticasone-Umeclidin-Vilant (TRELEGY ELLIPTA) 100-62.5-25 MCG/INH AEPB Inhale 1 puff into the lungs daily.  [name] ketoconazole (NIZORAL) 2 % shampoo Apply 1 application topically 2 (two) times a week.  . Multiple Vitamin (ONE-A-DAY MENS PO) Take 1 tablet by mouth daily.  . riluzole (RILUTEK) 50 MG tablet Take 1 tablet (50 mg total) by mouth every 12 (twelve) hours.   No current facility-administered medications for this visit. (Other)      REVIEW OF SYSTEMS: ROS    Positive for: Cardiovascular, Eyes   Negative for: Constitutional, Gastrointestinal, Neurological, Skin, Genitourinary, Musculoskeletal, HENT, Endocrine, Respiratory, Psychiatric, Allergic/Imm,  Heme/Lymph   Last edited by Roselee Nova D, COT on 08/29/2019  7:50 AM. (History)       ALLERGIES Allergies  Allergen Reactions  . Lipitor [Atorvastatin] Rash and Other (See Comments)    Passed out    PAST MEDICAL HISTORY Past Medical History:  Diagnosis Date  . Anemia    low iron  . Arthritis   . Colonic polyp   . Constipation   . COPD (chronic obstructive pulmonary disease) ([place])   . GERD (gastroesophageal reflux disease)   . Gout   . History of kidney stones   . Malignant tumor of kidney ([place])   . Muscle atrophy   .  Pain    LOWER BACK AND LEFT HIP - HX OF PREVIOUS LUMBAR AND CERVICAL SURGERY--PT STATES HE HAS HAD NUMBNESS IN BOTH FEET SINCE HIS BACK SURGERY  . Renal calculus   . Retinal detachment    Past Surgical History:  Procedure Laterality Date  . BACK SURGERY     LUMBAR SURGERY  . CATARACT EXTRACTION Bilateral 2003   Surgeon unknown  . EYE SURGERY     BILATERAL CATARACT EXTRACTIONS  . GIVENS CAPSULE STUDY N/A 10/07/2015   Procedure: GIVENS CAPSULE STUDY;  Surgeon: Carol Ada, MD;  Location: [facility];  Service: Endoscopy;  Laterality: N/A;  . HAND SURGERY     left  . KNEE SURGERY     right  . LAMINECTOMY     cervical  . LASER PHOTO ABLATION Left 08/19/2017   Procedure: LASER PHOTO ABLATION;  Surgeon: [name], MD;  Location: [facility];  Service: Ophthalmology;  Laterality: Left;  . MEMBRANE PEEL Left 03/22/2017   Procedure: POSSIBLE MEMBRANE PEEL WITH SILICONE OIL AND PERFLURON;  Surgeon: [name], MD;  Location: [facility];  Service: Ophthalmology;  Laterality: Left;  . NEPHRECTOMY     PT STATES RIGHT KIDNEY WAS REMOVED  . PAROTIDECTOMY Right 06/13/2018   Procedure: RIGHT TOTAL PAROTIDECTOMY;  Surgeon: Leta Baptist, MD;  Location: [facility];  Service: ENT;  Laterality: Right;  . PARS PLANA VITRECTOMY Left 03/22/2017   Procedure: PARS PLANA VITRECTOMY WITH 25 GAUGE;  Surgeon: [name], MD;  Location: [facility];  Service: Ophthalmology;  Laterality: Left;  . PARS PLANA VITRECTOMY W/ SCLERAL BUCKLE Left 08/19/2017   Procedure: 52 GAUGE PARS PLANA VITRECTOMY WITH SILCONE OIL REMOVAL;  Surgeon: [name], MD;  Location: [facility];  Service: Ophthalmology;  Laterality: Left;  . 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 SALIVARY STONE REMOVAL Right 02/21/2019   Procedure: RIGHT PAROTID FISTULA REPAIR;  Surgeon: Leta Baptist, MD;  Location: [facility];  Service: ENT;  Laterality: Right;  . SHOULDER SURGERY  2020   [facility]  . TOTAL KNEE ARTHROPLASTY Left 10/19/2012   Procedure:  LEFT TOTAL KNEE ARTHROPLASTY;  Surgeon: Magnus Sinning, MD;  Location: [facility];  Service: Orthopedics;  Laterality: Left;  [name] VITRECTOMY 25 GAUGE WITH SCLERAL BUCKLE Left 02/17/2017   Procedure: VITRECTOMY 25 GAUGE WITH SCLERAL BUCKLE membrane peel, injection of silicone oil, and endolaser photocoagulation.;  Surgeon: [name], MD;  Location: [facility];  Service: Ophthalmology;  Laterality: Left;    FAMILY HISTORY Family History  Problem Relation Age of Onset  . Diabetes Mother   . COPD Brother   . COPD Sister   . Heart attack Father   . Amblyopia Neg Hx   . Blindness Neg Hx   . Cataracts Neg Hx   . Glaucoma Neg Hx   .  Macular degeneration Neg Hx   . Retinal detachment Neg Hx   . Retinitis pigmentosa Neg Hx     SOCIAL HISTORY Social History   Tobacco Use  . Smoking status: Former Smoker    Packs/day: 2.00    Years: 40.00    Pack years: 80.00    Types: Cigarettes, Pipe    Quit date: 06/09/1995    Years since quitting: 24.2  . Smokeless tobacco: Never Used  Substance Use Topics  . Alcohol use: No  . Drug use: No         OPHTHALMIC EXAM:  Base Eye Exam    Visual Acuity (Snellen - Linear)      Right Left   Dist cc 20/25 +2 20/80 -2   Dist ph cc NI NI   Correction: Glasses       Tonometry (Tonopen, 8:00 AM)      Right Left   Pressure 15 16       Pupils      Dark Light Shape React APD   Right 3 2 Round Slow None   Left 5 5 Round Minimal None       Visual Fields (Counting fingers)      Left Right    Full Full       Extraocular Movement      Right Left    Full, Ortho Full, Ortho       Neuro/Psych    Oriented x3: Yes   Mood/Affect: Normal       Dilation    Both eyes: 1.0% Mydriacyl, 2.5% Phenylephrine @ 8:00 AM        Slit Lamp and Fundus Exam    External Exam      Right Left   External Normal Periorbital edema improving       Slit Lamp Exam      Right Left   Lids/Lashes Dermatochalasis - upper lid Dermatochalasis - upper lid    Conjunctiva/Sclera White and quiet STK gone   Cornea Arcus, trace Punctate epithelial erosions, mild tear film debris arcus, tear film debris, 1+ PEE   Anterior Chamber Deep and quiet Deep, quiet   Iris Round and dilated Round and moderately dilated   Lens Three piece Posterior chamber intraocular lens Three piece Posterior chamber intraocular lens in good position, micro condensations on posterior lens surface - stable   Vitreous Vitreous syneresis, PVD Post vitrectomy       Fundus Exam      Right Left   Disc Pink and Sharp, mild Peripapillary atrophy Sharp rim, mild pallor, temporal Peripapillary atrophy, +cupping   C/D Ratio 0.4 0.6   Macula Flat, good foveal reflex, Retinal pigment epithelial mottling - mild, No heme or edema  flat; Blunted foveal reflex, mild cystic changes temporal macula - mild increase   Vessels Vascular attenuation, Tortuous Vascular attenuation   Periphery Attached, mild Reticular degeneration; no RT or RD Attached, moderate buckle height; temporal/inf retinectomy from 2-630 with peripheral edge attached with good laser at the edge; trace areas of fibrosis inferiorly and superior temporal -- surrounded by good laser        Refraction    Wearing Rx      Sphere Cylinder Axis Add   Right -0.75 +1.00 151 +2.50   Left -0.50 +1.00 020 +2.50   Type: PAL       Manifest Refraction      Sphere Cylinder Axis Dist VA   Right       Left -1.00 +2.25  010 NI          IMAGING AND PROCEDURES  Imaging and Procedures for 09/19/17  OCT, Retina - OU - Both Eyes       Right Eye Quality was good. Central Foveal Thickness: 244. Progression has been stable. Findings include normal foveal contour, no IRF, no SRF.   Left Eye Quality was good. Central Foveal Thickness: 277. Progression has worsened. Findings include no SRF, epiretinal membrane, intraretinal fluid, abnormal foveal contour (Patchy ORA, interval increase IRF/CME temporal macula).   Notes Images taken,  stored on drive  Diagnosis / Impression:  OD: NFP, No IRF, No SRF OS: retina attached with oil out - interval increase in IRF/CME temporal macula  Clinical management:  See below  Abbreviations: NFP - Normal foveal profile. CME - cystoid macular edema. PED - pigment epithelial detachment. IRF - intraretinal fluid. SRF - subretinal fluid. EZ - ellipsoid zone. ERM - epiretinal membrane. ORA - outer retinal atrophy. ORT - outer retinal tubulation. SRHM - subretinal hyper-reflective material         Injection into Tenon's Capsule - OS - Left Eye       Time Out 08/29/2019. 8:22 AM. Confirmed correct patient, procedure, site, and patient consented.   Anesthesia Topical anesthesia was used. Anesthetic medications included Lidocaine 2%, Proparacaine 0.5%.   Procedure Preparation included 5% betadine to ocular surface, eyelid speculum. A 27 gauge needle was used.   Injection:  4 mg KENALOG-63m/ml injection 465m  NDC: 077035-0093-81Lot: [id number] date: 04/03/2020   Route: Other, Site: Left Eye  Post-op Post injection exam found visual acuity of at least counting fingers. The patient tolerated the procedure well. There were no complications. The patient received written and verbal post procedure care education. Post injection medications (Polymyxin B sulfate and trimethoprim).   Notes 0.9 cc of Kenalog-40 (36 mg) injected into subtenon's capsule in the superotemporal quadrant. Betadine was applied to Injection area pre and post-injection then rinsed with sterile BSS. 1 drop of polymixin was instilled into the eye. There were no complications. Pt tolerated procedure well.                 ASSESSMENT/PLAN:    ICD-10-CM   1. 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 Cystoid macular edema of left eye  H35.352 Injection into Tenon's Capsule - OS - Left Eye    triamcinolone acetonide (KENALOG-40) injection 4 mg  4.  Retinal edema  H35.81 OCT, Retina - OU - Both Eyes  5. Pseudophakia, both eyes  Z96.1   6. Corneal edema of left eye  H18.20     1-4. 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23.20 to 03.23.21 due to new diagnosis of motor neuron disease/ALS  - retina attached and in good position  - s/p STK OS #1 (  07.14.20) for CME  - currently on PF QID and Ketorolac TID OS for CME  - OCT today shows interval increase in focal IRF/CME temporal macula OS  - corneal edema slightly improved-- stay on muro 128 OS  - continue PF and ketorolac QID OS  - recommend STK #2 for CME OS  - consent obtained and signed today on 03.23.21  - IOP 16 today -- good off Alphagan P  - cont PSO ung PRN OS  - f/u 6 weeks for K and CME check  5. Pseudophakia OU  - IOLs in good position OU  - monitor  6. Corneal edema OS -- improved  - persistent post op corneal edema OS as above  - cont Muro 128 BID as above   Ophthalmic Meds Ordered this visit:  Meds ordered this encounter  Medications  . prednisoLONE acetate (PRED FORTE) 1 % ophthalmic suspension    Sig: Place 1 drop into the left eye 4 (four) times daily.    Dispense:  10 mL    Refill:  1  . ketorolac (ACULAR) 0.5 % ophthalmic solution    Sig: Place 1 drop into the left eye 4 (four) times daily.    Dispense:  5 mL    Refill:  5  . triamcinolone acetonide  (KENALOG-40) injection 4 mg       Return in about 6 weeks (around 10/10/2019) for DFE, OCT.  There are no Patient Instructions on file for this visit.   Explained the diagnoses, plan, and follow up with the patient and they expressed understanding.  Patient expressed understanding of the importance of proper follow up care.  This document serves as a record of services personally performed by Gardiner Sleeper, MD, PhD. It was created on their behalf by Roselee Nova, COMT. The creation of this record is the provider's dictation and/or activities during the visit.  Electronically signed by: Roselee Nova, COMT 08/29/19 8:44 AM  Gardiner Sleeper, M.D., Ph.D. Diseases & Surgery of the Retina and Cucumber 08/29/2019   I have reviewed the above documentation for accuracy and completeness, and I agree with the above. Gardiner Sleeper, M.D., Ph.D. 08/29/19 8:44 AM    Abbreviations: M myopia (nearsighted); A astigmatism; H hyperopia (farsighted); P presbyopia; Mrx spectacle prescription;  CTL contact lenses; OD right eye; OS left eye; OU both eyes  XT exotropia; ET esotropia; PEK punctate epithelial keratitis; PEE punctate epithelial erosions; DES dry eye syndrome; MGD meibomian gland dysfunction; ATs artificial tears; PFAT's preservative free artificial tears; [place] nuclear sclerotic cataract; PSC posterior subcapsular cataract; ERM epi-retinal membrane; PVD posterior vitreous detachment; RD retinal detachment; DM diabetes mellitus; DR diabetic retinopathy; NPDR non-proliferative diabetic retinopathy; PDR proliferative diabetic retinopathy; CSME clinically significant macular edema; DME diabetic macular edema; dbh dot blot hemorrhages; CWS cotton wool spot; POAG primary open angle glaucoma; C/D cup-to-disc ratio; HVF humphrey visual field; GVF goldmann visual field; OCT optical coherence tomography; IOP intraocular pressure; BRVO Branch retinal vein occlusion; CRVO  central retinal vein occlusion; CRAO central retinal artery occlusion; BRAO branch retinal artery occlusion; RT retinal tear; SB scleral buckle; PPV pars plana vitrectomy; VH Vitreous hemorrhage; PRP panretinal laser photocoagulation; IVK intravitreal kenalog; VMT vitreomacular traction; MH Macular hole;  NVD neovascularization of the disc; NVE neovascularization elsewhere; AREDS age related eye disease study; ARMD age related macular degeneration; POAG primary open angle glaucoma; EBMD epithelial/anterior basement membrane dystrophy; ACIOL anterior chamber intraocular lens; IOL intraocular lens; PCIOL posterior chamber intraocular  lens; Phaco/IOL phacoemulsification with intraocular lens placement; [place] photorefractive keratectomy; LASIK laser assisted in situ keratomileusis; HTN hypertension; DM diabetes mellitus; COPD chronic obstructive pulmonary disease

## 2019-08-29 ENCOUNTER — Encounter (INDEPENDENT_AMBULATORY_CARE_PROVIDER_SITE_OTHER): Payer: Self-pay | Admitting: Ophthalmology

## 2019-08-29 ENCOUNTER — Ambulatory Visit (INDEPENDENT_AMBULATORY_CARE_PROVIDER_SITE_OTHER): Payer: Medicare HMO | Admitting: Ophthalmology

## 2019-08-29 DIAGNOSIS — H1820 Unspecified corneal edema: Secondary | ICD-10-CM | POA: Diagnosis not present

## 2019-08-29 DIAGNOSIS — H35352 Cystoid macular degeneration, left eye: Secondary | ICD-10-CM | POA: Diagnosis not present

## 2019-08-29 DIAGNOSIS — H3322 Serous retinal detachment, left eye: Secondary | ICD-10-CM

## 2019-08-29 DIAGNOSIS — H3581 Retinal edema: Secondary | ICD-10-CM

## 2019-08-29 DIAGNOSIS — Z961 Presence of intraocular lens: Secondary | ICD-10-CM | POA: Diagnosis not present

## 2019-08-29 DIAGNOSIS — H3522 Other non-diabetic proliferative retinopathy, left eye: Secondary | ICD-10-CM

## 2019-08-29 MED ORDER — TRIAMCINOLONE ACETONIDE 40 MG/ML IJ SUSP FOR KALEIDOSCOPE
4.0000 mg | INTRAMUSCULAR | Status: AC | PRN
  Administered 2019-08-29: 4 mg

## 2019-08-29 MED ORDER — PREDNISOLONE ACETATE 1 % OP SUSP: 1.0000 [drp] | Freq: Four times a day (QID) | OPHTHALMIC | 1 refills | Status: DC

## 2019-08-29 MED ORDER — KETOROLAC TROMETHAMINE 0.5 % OP SOLN: 1.0000 [drp] | Freq: Four times a day (QID) | OPHTHALMIC | 5 refills | Status: DC

## 2019-09-06 DIAGNOSIS — L821 Other seborrheic keratosis: Secondary | ICD-10-CM | POA: Diagnosis not present

## 2019-09-06 DIAGNOSIS — L298 Other pruritus: Secondary | ICD-10-CM | POA: Diagnosis not present

## 2019-09-06 DIAGNOSIS — D1801 Hemangioma of skin and subcutaneous tissue: Secondary | ICD-10-CM | POA: Diagnosis not present

## 2019-09-06 DIAGNOSIS — D225 Melanocytic nevi of trunk: Secondary | ICD-10-CM | POA: Diagnosis not present

## 2019-09-11 DIAGNOSIS — Z5181 Encounter for therapeutic drug level monitoring: Secondary | ICD-10-CM | POA: Diagnosis not present

## 2019-09-11 DIAGNOSIS — G1221 Amyotrophic lateral sclerosis: Secondary | ICD-10-CM | POA: Diagnosis not present

## 2019-09-12 DIAGNOSIS — G1221 Amyotrophic lateral sclerosis: Secondary | ICD-10-CM | POA: Diagnosis not present

## 2019-09-12 DIAGNOSIS — Z789 Other specified health status: Secondary | ICD-10-CM | POA: Diagnosis not present

## 2019-09-12 DIAGNOSIS — R471 Dysarthria and anarthria: Secondary | ICD-10-CM | POA: Diagnosis not present

## 2019-09-12 DIAGNOSIS — M6281 Muscle weakness (generalized): Secondary | ICD-10-CM | POA: Diagnosis not present

## 2019-10-09 ENCOUNTER — Other Ambulatory Visit: Payer: Self-pay

## 2019-10-09 ENCOUNTER — Ambulatory Visit: Payer: Medicare HMO | Admitting: Neurology

## 2019-10-09 ENCOUNTER — Encounter: Payer: Self-pay | Admitting: Neurology

## 2019-10-09 VITALS — BP 113/71 | HR 81 | Temp 96.9°F | Ht 73.0 in | Wt 177.5 lb

## 2019-10-09 DIAGNOSIS — G1220 Motor neuron disease, unspecified: Secondary | ICD-10-CM | POA: Diagnosis not present

## 2019-10-09 NOTE — Progress Notes (Signed)
PATIENT: Jerome Mays DOB: 03-25-1941  Chief Complaint  Patient presents with  . ALS    He is here with his wife, [name] and daughter, [name]. He was seen at [facility] on 09/11/19. Feels his arms and shoulders are weaker. Says he sometimes stumbles when he walks. No recent falls.     HISTORICAL  PASTOR SGRO is a 79 year old male, seen in request by his primary care physician Dr. [name], and pain management Dr. Nelva Bush, Delfino Lovett, for evaluation of gradual onset of muscle atrophy, he is accompanied by his wife at today's visit on May 15, 2019.  I have reviewed and summarized the referring note from the referring physician.  He was previously fairly healthy, in 2019, he noticed gradual onset difficulty raising right arm overhead, but he denies significant right shoulder pain, he was diagnosed with right rotator cuff surgery, had surgery, which failed to improve his right arm weakness, since summer of 2020, he also noticed gradual onset left arm weakness, progressively worse, over the past couple years, he had frequent bilateral proximal thigh muscle cramping, but he denies sensory changes, denies bilateral lower extremity weakness, he denies significant dysarthria, no dysphagia, he denies significant difficulty breathing, but he tends to sleep in a sitting up position at nighttime due to bilateral arm weakness  He also had 10 pound weight loss over past 1 year,  UPDATE Jan 28th 2021: He is accompanied by his daughter and wife at today's clinic visit  I personally reviewed MRI of the brain, mild generalized atrophy, supratentorium small vessel disease MRI of thoracic spine multilevel degenerative changes, no significant canal or foraminal narrowing MRI of lumbar spine, multilevel degenerative changes, severe spinal stenosis in transverse diameter at L3-4, causing moderate bilateral foraminal narrowing, moderately severe bilateral lateral recess stenosis with potential compression of L4  nerve roots, postsurgical change L4-5, but there was no significant compression; L5-S1, mild to moderate spinal stenosis, hypertrophy,  MRI cervical report from [facility], Postsurgical changes of right posterior C6-7 foraminotomies without significant right neural foraminal narrowing, moderate left neural foraminal narrowing  Patient continues complain slow decline, noticed more difficulty with his left arm, denies significant gait abnormality, denies swallowing difficulty, he has COPD, shortness of breath with exertion  Laboratory evaluation showed negative acetylcholine receptor binding antibody, CBC showed hemoglobin of 12.5, CMP was normal, normal vitamin D, copper, positive ANA, with double-stranded DNA, SSA, ferritin was mildly decreased 23, mildly elevated M spike 0.1, normal CPK, ESR, C-reactive protein, folic acid, HIV RPR, H47 slightly elevated TSH 5.7  UPDATE Oct 09 2019: He is accompanied by his daughter and wife at today's clinical visit,he continued to experience worsening bilateral upper extremity weakness, denies significant dysarthria, dysphagia, denies significant gait abnormality.  He was seen by [facility] September 11, 2019, confirmed the diagnosis of motor neuron disease, was also evaluated by PT OT speech therapy,  I reviewed pulmonary functional test on September 12, 2019 from [facility], FVC 3.34, FEV1 1.89, FEV1/FVC 56%,  He is sleeping in a recliner, complains of low back pain lying flat, also carried a diagnosis of COPD  REVIEW OF SYSTEMS: Full 14 system review of systems performed and notable only for as above All other review of systems were negative.  ALLERGIES: Allergies  Allergen Reactions  . Lipitor [Atorvastatin] Rash and Other (See Comments)    Passed out    HOME MEDICATIONS: Current Outpatient Medications  Medication Sig Dispense Refill  . allopurinol (ZYLOPRIM) 300 MG tablet Take  300 mg by mouth daily.     [name] aspirin EC 81 MG tablet Take 81 mg by  mouth daily.    . cetirizine (ZYRTEC) 10 MG tablet Take 10 mg by mouth daily.    . cholecalciferol (VITAMIN D3) 25 MCG (1000 UT) tablet Take 1,000 Units by mouth daily.    . clobetasol cream (TEMOVATE) 7.32 % Apply 1 application topically 2 (two) times daily.    . Fluticasone-Umeclidin-Vilant (TRELEGY ELLIPTA) 100-62.5-25 MCG/INH AEPB Inhale 1 puff into the lungs daily. 30 each 11  . hydrOXYzine (ATARAX/VISTARIL) 10 MG tablet Take 10 mg by mouth at bedtime.    [name] ketoconazole (NIZORAL) 2 % shampoo Apply 1 application topically 2 (two) times a week.    [name] ketorolac (ACULAR) 0.5 % ophthalmic solution Place 1 drop into the left eye 4 (four) times daily. 5 mL 5  . Multiple Vitamin (ONE-A-DAY MENS PO) Take 1 tablet by mouth daily.    . prednisoLONE acetate (PRED FORTE) 1 % ophthalmic suspension Place 1 drop into the left eye 4 (four) times daily. 10 mL 1  . riluzole (RILUTEK) 50 MG tablet Take 1 tablet (50 mg total) by mouth every 12 (twelve) hours. 60 tablet 11  . sodium chloride (MURO 128) 5 % ophthalmic solution Place 1 drop into the left eye 2 (two) times a day. (Patient taking differently: Place 1 drop into the left eye in the morning, at noon, in the evening, and at bedtime. ) 15 mL 5   No current facility-administered medications for this visit.    PAST MEDICAL HISTORY: Past Medical History:  Diagnosis Date  . Anemia    low iron  . Arthritis   . Colonic polyp   . Constipation   . COPD (chronic obstructive pulmonary disease) ([place])   . GERD (gastroesophageal reflux disease)   . Gout   . History of kidney stones   . 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 Muscle atrophy   . Pain    LOWER BACK AND LEFT HIP - HX OF PREVIOUS LUMBAR AND CERVICAL SURGERY--PT STATES HE HAS HAD NUMBNESS IN BOTH FEET SINCE HIS BACK SURGERY  . Renal calculus   . Retinal detachment     PAST SURGICAL HISTORY: Past Surgical History:  Procedure Laterality Date  . BACK SURGERY     LUMBAR SURGERY  . CATARACT  EXTRACTION Bilateral 2003   Surgeon unknown  . EYE SURGERY     BILATERAL CATARACT EXTRACTIONS  . GIVENS CAPSULE STUDY N/A 10/07/2015   Procedure: GIVENS CAPSULE STUDY;  Surgeon: Carol Ada, MD;  Location: [place];  Service: Endoscopy;  Laterality: N/A;  . HAND SURGERY     left  . KNEE SURGERY     right  . LAMINECTOMY     cervical  . LASER PHOTO ABLATION Left 08/19/2017   Procedure: LASER PHOTO ABLATION;  Surgeon: [name], MD;  Location: [place];  Service: Ophthalmology;  Laterality: Left;  . MEMBRANE PEEL Left 03/22/2017   Procedure: POSSIBLE MEMBRANE PEEL WITH SILICONE OIL AND PERFLURON;  Surgeon: [name], MD;  Location: [place];  Service: Ophthalmology;  Laterality: Left;  . NEPHRECTOMY     PT STATES RIGHT KIDNEY WAS REMOVED  . PAROTIDECTOMY Right 06/13/2018   Procedure: RIGHT TOTAL PAROTIDECTOMY;  Surgeon: Leta Baptist, MD;  Location: [place];  Service: ENT;  Laterality: Right;  . PARS PLANA VITRECTOMY Left 03/22/2017   Procedure: PARS PLANA VITRECTOMY WITH 25 GAUGE;  Surgeon: [name], MD;  Location: [facility];  Service: Ophthalmology;  Laterality: Left;  . PARS PLANA VITRECTOMY W/ SCLERAL BUCKLE Left 08/19/2017   Procedure: 55 GAUGE PARS PLANA VITRECTOMY WITH SILCONE OIL REMOVAL;  Surgeon: [name], MD;  Location: [place];  Service: Ophthalmology;  Laterality: Left;  . 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 SALIVARY STONE REMOVAL Right 02/21/2019   Procedure: RIGHT PAROTID FISTULA REPAIR;  Surgeon: Leta Baptist, MD;  Location: [place];  Service: ENT;  Laterality: Right;  . SHOULDER SURGERY  2020   [facility]  . TOTAL KNEE ARTHROPLASTY Left 10/19/2012   Procedure: LEFT TOTAL KNEE ARTHROPLASTY;  Surgeon: Magnus Sinning, MD;  Location: [facility];  Service: Orthopedics;  Laterality: Left;  [name] VITRECTOMY 25 GAUGE WITH SCLERAL BUCKLE Left 02/17/2017   Procedure: VITRECTOMY 25 GAUGE WITH SCLERAL BUCKLE membrane peel, injection of silicone oil, and endolaser  photocoagulation.;  Surgeon: [name], MD;  Location: [facility];  Service: Ophthalmology;  Laterality: Left;    FAMILY HISTORY: Family History  Problem Relation Age of Onset  . Diabetes Mother   . COPD Brother   . COPD Sister   . Heart attack Father   . Amblyopia Neg Hx   . Blindness Neg Hx   . Cataracts Neg Hx   . Glaucoma Neg Hx   . Macular degeneration Neg Hx   . Retinal detachment Neg Hx   . Retinitis pigmentosa Neg Hx     SOCIAL HISTORY: Social History   Socioeconomic History  . Marital status: Married    Spouse name: Not on file  . Number of children: 3  . Years of education: 8th grade  . Highest education level: Not on file  Occupational History  . Occupation: Retired  Tobacco Use  . Smoking status: Former Smoker    Packs/day: 2.00    Years: 40.00    Pack years: 80.00    Types: Cigarettes, Pipe    Quit date: 06/09/1995    Years since quitting: 24.3  . Smokeless tobacco: Never Used  Substance and Sexual Activity  . Alcohol use: No  . Drug use: No  . Sexual activity: Not on file  Other Topics Concern  . Not on file  Social History Narrative   Lives with wife, [name].   Right-handed.   No daily caffeine use.      Social Determinants of Health   Financial Resource Strain:   . Difficulty of Paying Living Expenses:   Food Insecurity:   . Worried About Charity fundraiser in the Last Year:   . Arboriculturist in the Last Year:   Transportation Needs:   . Film/video editor (Medical):   [name] Lack of Transportation (Non-Medical):   Physical Activity:   . Days of Exercise per Week:   . Minutes of Exercise per Session:   Stress:   . Feeling of Stress :   Social Connections:   . Frequency of Communication with Friends and Family:   . Frequency of Social Gatherings with Friends and Family:   . Attends Religious Services:   . Active Member of Clubs or Organizations:   . Attends Archivist Meetings:   [name] Marital Status:   Intimate Partner  Violence:   . Fear of Current or Ex-Partner:   . Emotionally Abused:   [name] Physically Abused:   . Sexually Abused:      PHYSICAL EXAM   Vitals:   10/09/19 0810  BP: 113/71  Pulse: 81  Temp: [name])  96.9 F (36.1 C)  Weight: 177 lb 8 oz (80.5 kg)  Height: _0  (1.854 m)    Not recorded      Body mass index is 23.42 kg/m.  PHYSICAL EXAMNIATION:  Gen: NAD, conversant, well nourised, well groomed                     Cardiovascular: Regular rate rhythm, no peripheral edema, warm, nontender. Eyes: Conjunctivae clear without exudates or hemorrhage Neck: Supple, no carotid bruits. Pulmonary: Clear to auscultation bilaterally, mildly decreased air movement at the base of bilateral lung.  NEUROLOGICAL EXAM:  MENTAL STATUS: Speech:    Mildly slow spastic speech; fluent and spontaneous with normal comprehension.  Cognition:     Orientation to time, place and person     Normal recent and remote memory     Normal Attention span and concentration     Normal Language, naming, repeating,spontaneous speech     Fund of knowledge   CRANIAL NERVES: CN II: Visual fields are full to confrontation.  Pupils are round equal and briskly reactive to light. CN III, IV, VI: extraocular movement are normal. No ptosis. CN V: Facial sensation is intact to pinprick in all 3 divisions bilaterally. CN VII: Face is symmetric with normal eye closure and smile.  He has mild cheek puff weakness CN VIII: Hearing is normal to causal conversation. CN IX, X: Palate elevates symmetrically. Phonation is normal. CN XI: Head turning and shoulder shrug are intact CN XII: Tongue has no atrophy or muscle fasciculation, mild spastic slow tongue movement.  MOTOR: He has frequent bilateral upper and lower extremity muscle fasciculations, significant atrophy of bilateral upper extremity proximal muscles, and intrinsic hand muscles,  (R/L), shoulder abduction 2/3, elbow flexion1/3, elbow extension 2/4, wrist flexion  3/4, wrist extension3/3, grip 3/4.  Finger abduction 3/4 Hip flexion 5-/5-, distal leg muscle strength is normal  REFLEXES: Reflexes are 2+ and symmetric at the biceps, triceps, 3/3 knees, and absent at ankles. Plantar responses are extensor bilaterally  SENSORY: Length dependent decreased to light touch, pinprick, vibratory sensation to knee level  COORDINATION: There is no trunk or limb dysmetria noted.  GAIT/STANCE: He can get up from seated position arms crossed, steady   DIAGNOSTIC DATA (LABS, IMAGING, TESTING) - I reviewed patient records, labs, notes, testing and imaging myself where available.   ASSESSMENT AND PLAN  ASHKAN CHAMBERLAND is a 79 y.o. male   Motor neuron disease,  Confirmed by abnormal EMG nerve conduction study on May 17, 2019, widespread chronic and active neuropathic changes involving right bulbar, cervical, lumbar sacral myotomes.  Also suggestion of chronic neuropathic changes involving right thoracic paraspinal muscles.  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  Extensive imaging study of neuro axis failed to explain widespread chronic neuropathic changes involving bulbar, cervical, lumbar sacral myotomes, there also suggestion of chronic thoracic myotome changes.  Laboratory evaluation also failed to demonstrate etiology  Diagnosis was also confirmed by [facility].  I had extensive discussion with patient and his family, about potential progression of the disease, this including worsening dysarthria, dysphagia, breathing difficulty,  He started Rilutek 50 mg twice a day since January 2021, tolerating it well.  Laboratory evaluations CMP, CBC  Will refer him to PT, OT, ST,  Patient and family are debating continue the care through our clinic versus [facility], he will call us for the decision    Marcial Pacas, M.D. Ph.D.  [facility] [address], [place], [place]  [address] Ph: [phone] Fax: [phone]  [id number], Delfino Lovett, MD, Suella Broad, MD

## 2019-10-09 NOTE — Progress Notes (Signed)
. [facility] Note  10/10/2019     CHIEF COMPLAINT Patient presents for Retina Follow Up   HISTORY OF PRESENT ILLNESS: Jerome Mays is a 79 y.o. male who presents to the clinic today for:   HPI    Retina Follow Up    Patient presents with  Retinal Break/Detachment.  In left eye.  This started weeks ago.  Severity is moderate.  Duration of weeks.  Since onset it is stable.  I, the attending physician,  performed the HPI with the patient and updated documentation appropriately.          Comments    Pt states vision is about the same OU.  Pt denies eye pain or discomfort.  Denies any new or worsening floaters or fol OU.       Last edited by [name], MD on 10/10/2019  9:42 AM. (History)    Patient spent 2 days in [place] getting a full work up for ALS, pt states he is unable to lift arms due to muscle weakness, but he feels like the disease is progressing slowly, he states no change in vision since last visit   Referring physician: [name], MD [place],  [place] [address]  HISTORICAL INFORMATION:   Selected notes from the [facility] Initial referral from Dr. [name] for RD OS   CURRENT MEDICATIONS: Current Outpatient Medications (Ophthalmic Drugs)  Medication Sig  . ketorolac (ACULAR) 0.5 % ophthalmic solution Place 1 drop into the left eye 4 (four) times daily.  . prednisoLONE acetate (PRED FORTE) 1 % ophthalmic suspension Place 1 drop into the left eye 4 (four) times daily.  . sodium chloride (MURO 128) 5 % ophthalmic solution Place 1 drop into the left eye 2 (two) times a day. (Patient taking differently: Place 1 drop into the left eye in the morning, at noon, in the evening, and at bedtime. )   No current facility-administered medications for this visit. (Ophthalmic Drugs)   Current Outpatient Medications (Other)  Medication Sig  . allopurinol (ZYLOPRIM) 300 MG tablet Take 300 mg by mouth daily.   [name]  aspirin EC 81 MG tablet Take 81 mg by mouth daily.  . cetirizine (ZYRTEC) 10 MG tablet Take 10 mg by mouth daily.  . cholecalciferol (VITAMIN D3) 25 MCG (1000 UT) tablet Take 1,000 Units by mouth daily.  . clobetasol cream (TEMOVATE) 4.65 % Apply 1 application topically 2 (two) times daily.  . Fluticasone-Umeclidin-Vilant (TRELEGY ELLIPTA) 100-62.5-25 MCG/INH AEPB Inhale 1 puff into the lungs daily.  . hydrOXYzine (ATARAX/VISTARIL) 10 MG tablet Take 10 mg by mouth at bedtime.  [name] ketoconazole (NIZORAL) 2 % shampoo Apply 1 application topically 2 (two) times a week.  . Multiple Vitamin (ONE-A-DAY MENS PO) Take 1 tablet by mouth daily.  . riluzole (RILUTEK) 50 MG tablet Take 1 tablet (50 mg total) by mouth every 12 (twelve) hours.   No current facility-administered medications for this visit. (Other)      REVIEW OF SYSTEMS: ROS    Positive for: Cardiovascular, Eyes   Negative for: Constitutional, Gastrointestinal, Neurological, Skin, Genitourinary, Musculoskeletal, HENT, Endocrine, Respiratory, Psychiatric, Allergic/Imm, Heme/Lymph   Last edited by Doneen Poisson on 10/10/2019  8:57 AM. (History)       ALLERGIES Allergies  Allergen Reactions  . Lipitor [Atorvastatin] Rash and Other (See Comments)    Passed out    PAST MEDICAL HISTORY Past Medical History:  Diagnosis Date  . Anemia  low iron  . Arthritis   . Colonic polyp   . Constipation   . COPD (chronic obstructive pulmonary disease) ([place])   . GERD (gastroesophageal reflux disease)   . Gout   . History of kidney stones   . Malignant tumor of kidney ([place])   . Muscle atrophy   . Pain    LOWER BACK AND LEFT HIP - HX OF PREVIOUS LUMBAR AND CERVICAL SURGERY--PT STATES HE HAS HAD NUMBNESS IN BOTH FEET SINCE HIS BACK SURGERY  . Renal calculus   . Retinal detachment    Past Surgical History:  Procedure Laterality Date  . BACK SURGERY     LUMBAR SURGERY  . CATARACT EXTRACTION Bilateral 2003   Surgeon unknown  . EYE  SURGERY     BILATERAL CATARACT EXTRACTIONS  . GIVENS CAPSULE STUDY N/A 10/07/2015   Procedure: GIVENS CAPSULE STUDY;  Surgeon: Carol Ada, MD;  Location: [facility];  Service: Endoscopy;  Laterality: N/A;  . HAND SURGERY     left  . KNEE SURGERY     right  . LAMINECTOMY     cervical  . LASER PHOTO ABLATION Left 08/19/2017   Procedure: LASER PHOTO ABLATION;  Surgeon: [name], MD;  Location: [facility];  Service: Ophthalmology;  Laterality: Left;  . MEMBRANE PEEL Left 03/22/2017   Procedure: POSSIBLE MEMBRANE PEEL WITH SILICONE OIL AND PERFLURON;  Surgeon: [name], MD;  Location: [facility];  Service: Ophthalmology;  Laterality: Left;  . NEPHRECTOMY     PT STATES RIGHT KIDNEY WAS REMOVED  . PAROTIDECTOMY Right 06/13/2018   Procedure: RIGHT TOTAL PAROTIDECTOMY;  Surgeon: Leta Baptist, MD;  Location: [facility];  Service: ENT;  Laterality: Right;  . PARS PLANA VITRECTOMY Left 03/22/2017   Procedure: PARS PLANA VITRECTOMY WITH 25 GAUGE;  Surgeon: [name], MD;  Location: [facility];  Service: Ophthalmology;  Laterality: Left;  . PARS PLANA VITRECTOMY W/ SCLERAL BUCKLE Left 08/19/2017   Procedure: 34 GAUGE PARS PLANA VITRECTOMY WITH SILCONE OIL REMOVAL;  Surgeon: [name], MD;  Location: [facility];  Service: Ophthalmology;  Laterality: Left;  . 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 SALIVARY STONE REMOVAL Right 02/21/2019   Procedure: RIGHT PAROTID FISTULA REPAIR;  Surgeon: Leta Baptist, MD;  Location: [facility];  Service: ENT;  Laterality: Right;  . SHOULDER SURGERY  2020   [facility]  . TOTAL KNEE ARTHROPLASTY Left 10/19/2012   Procedure: LEFT TOTAL KNEE ARTHROPLASTY;  Surgeon: Magnus Sinning, MD;  Location: [facility];  Service: Orthopedics;  Laterality: Left;  [name] VITRECTOMY 25 GAUGE WITH SCLERAL BUCKLE Left 02/17/2017   Procedure: VITRECTOMY 25 GAUGE WITH SCLERAL BUCKLE membrane peel, injection of silicone oil, and endolaser photocoagulation.;  Surgeon: [name], MD;   Location: [facility];  Service: Ophthalmology;  Laterality: Left;    FAMILY HISTORY Family History  Problem Relation Age of Onset  . Diabetes Mother   . COPD Brother   . COPD Sister   . Heart attack Father   . Amblyopia Neg Hx   . Blindness Neg Hx   . Cataracts Neg Hx   . Glaucoma Neg Hx   . Macular degeneration Neg Hx   . Retinal detachment Neg Hx   . Retinitis pigmentosa Neg Hx     SOCIAL HISTORY Social History   Tobacco Use  . Smoking status: Former Smoker    Packs/day: 2.00    Years: 40.00    Pack years: 80.00    Types: Cigarettes, Pipe  Quit date: 06/09/1995    Years since quitting: 24.3  . Smokeless tobacco: Never Used  Substance Use Topics  . Alcohol use: No  . Drug use: No         OPHTHALMIC EXAM:  Base Eye Exam    Visual Acuity (Snellen - Linear)      Right Left   Dist cc 20/20 -2 20/80 -2   Dist ph cc  NI   Correction: Glasses       Tonometry (Tonopen, 9:01 AM)      Right Left   Pressure 14 18       Pupils      Dark Light Shape React APD   Right 3 2 Round Brisk 0   Left 5 5 Round NR 0       Visual Fields   Pt unable to cover OD/OS with hand due to weakness in Arms.  Pt reports that he has lost strength in arms due to ALS.       Extraocular Movement      Right Left    Full Full       Neuro/Psych    Oriented x3: Yes   Mood/Affect: Normal       Dilation    Both eyes: 1.0% Mydriacyl, 2.5% Phenylephrine @ 9:01 AM        Slit Lamp and Fundus Exam    External Exam      Right Left   External Normal Periorbital edema improving       Slit Lamp Exam      Right Left   Lids/Lashes Dermatochalasis - upper lid Dermatochalasis - upper lid   Conjunctiva/Sclera White and quiet White and quiet, residual STK ST quad   Cornea Arcus, mild tear film debris arcus, tear film debris, 1+ PEE, mild corneal edema and haze   Anterior Chamber Deep and quiet Deep, quiet   Iris Round and dilated Round and moderately dilated   Lens Three piece Posterior  chamber intraocular lens, trace PCO Three piece Posterior chamber intraocular lens in good position, micro condensations on posterior lens surface - stable   Vitreous Vitreous syneresis, PVD Post vitrectomy       Fundus Exam      Right Left   Disc Pink and Sharp, mild Peripapillary atrophy Sharp rim, mild pallor, temporal Peripapillary atrophy, +cupping   C/D Ratio 0.4 0.6   Macula Flat, good foveal reflex, Retinal pigment epithelial mottling - mild, No heme or edema  flat; Blunted foveal reflex, mild cystic changes temporal macula - slightly improved   Vessels Vascular attenuation, Tortuous Vascular attenuation   Periphery Attached, mild Reticular degeneration; no RT or RD Attached, moderate buckle height; temporal/inf retinectomy from 2-630 with peripheral edge attached with good laser at the edge; trace areas of fibrosis inferiorly and superior temporal -- surrounded by good laser        Refraction    Wearing Rx      Sphere Cylinder Axis Add   Right -0.75 +1.00 151 +2.50   Left -0.50 +1.00 020 +2.50   Type: PAL          IMAGING AND PROCEDURES  Imaging and Procedures for 09/19/17  OCT, Retina - OU - Both Eyes       Right Eye Quality was good. Central Foveal Thickness: 243. Progression has been stable. Findings include normal foveal contour, no IRF, no SRF.   Left Eye Quality was good. Central Foveal Thickness: 242. Progression has improved. Findings include no SRF,  epiretinal membrane, intraretinal fluid, abnormal foveal contour (Patchy ORA, interval improvement IRF/CME temporal macula).   Notes Images taken, stored on drive  Diagnosis / Impression:  OD: NFP, No IRF, No SRF OS: retina attached with oil out - Patchy ORA; interval improvement IRF/CME temporal macula  Clinical management:  See below  Abbreviations: NFP - Normal foveal profile. CME - cystoid macular edema. PED - pigment epithelial detachment. IRF - intraretinal fluid. SRF - subretinal fluid. EZ -  ellipsoid zone. ERM - epiretinal membrane. ORA - outer retinal atrophy. ORT - outer retinal tubulation. SRHM - subretinal hyper-reflective material                  ASSESSMENT/PLAN:    ICD-10-CM   1. 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 Cystoid macular edema of left eye  H35.352   4. Retinal edema  H35.81 OCT, Retina - OU - Both Eyes  5. Pseudophakia, both eyes  Z96.1   6. Corneal edema of left eye  H18.20     1-4. 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23.20 to 03.23.21 due to new diagnosis of motor neuron disease/ALS  - retina attached and in good position  - s/p STK OS #1 (07.14.20), #2 (3.23.21) for CME  - currently on PF QID and Ketorolac TID OS for CME  - OCT today shows interval improvement in focal IRF/CME temporal macula OS  - BCVA remains stable at 20/80 OS  - corneal edema slightly improved-- stay on muro 128 OS  - continue PF QID and ketorolac TID OS  - IOP 18 today -- good off Alphagan P  - f/u 6-8 weeks for K and CME check  5. Pseudophakia OU  - IOLs in good position OU  - monitor  6. Corneal edema OS -- improved  - persistent post op corneal edema OS as above  - cont Muro 128 BID as above   Ophthalmic Meds Ordered this visit:  No orders of the defined types were placed in this encounter.       Return for f/u 6-8 weeks, K/CME check, DFE, OCT.  There are no Patient Instructions on file for this visit.   Explained the diagnoses, plan, and follow up with the patient and they expressed understanding.  Patient expressed understanding of the importance of proper follow up care.  This document serves as a record of services personally performed by Gardiner Sleeper, MD, PhD. It was created on their behalf by Estill Bakes, COT an ophthalmic technician. The creation of this record is the provider's dictation and/or activities during the visit.    Electronically signed by: Estill Bakes, COT 10/09/19 @ 12:37 PM  Gardiner Sleeper, M.D., Ph.D. Diseases & Surgery of the Retina and [place] 10/10/2019   I have  reviewed the above documentation for accuracy and completeness, and I agree with the above. Gardiner Sleeper, M.D., Ph.D. 10/10/19 12:37 PM   Abbreviations: M myopia (nearsighted); A astigmatism; H hyperopia (farsighted); P presbyopia; Mrx spectacle prescription;  CTL contact lenses; OD right eye; OS left eye; OU both eyes  XT exotropia; ET esotropia; PEK punctate epithelial keratitis; PEE punctate epithelial erosions; DES dry eye syndrome; MGD meibomian gland dysfunction; ATs artificial tears; PFAT's preservative free artificial tears; [place] nuclear sclerotic cataract; PSC posterior subcapsular cataract; ERM epi-retinal membrane; PVD posterior vitreous detachment; RD retinal detachment; DM diabetes mellitus; DR diabetic retinopathy; NPDR non-proliferative diabetic retinopathy; PDR proliferative diabetic retinopathy; CSME clinically significant macular edema; DME diabetic macular edema; dbh dot blot hemorrhages; CWS cotton wool spot; POAG primary open angle glaucoma; C/D cup-to-disc ratio; HVF humphrey visual field; GVF goldmann visual field; OCT optical coherence tomography; IOP intraocular pressure; BRVO Branch retinal vein occlusion; CRVO central retinal vein  occlusion; CRAO central retinal artery occlusion; BRAO branch retinal artery occlusion; RT retinal tear; SB scleral buckle; PPV pars plana vitrectomy; VH Vitreous hemorrhage; PRP panretinal laser photocoagulation; IVK intravitreal kenalog; VMT vitreomacular traction; MH Macular hole;  NVD neovascularization of the disc; NVE neovascularization elsewhere; AREDS age related eye disease study; ARMD age related macular degeneration; POAG primary open angle glaucoma; EBMD epithelial/anterior basement membrane dystrophy; ACIOL anterior chamber intraocular lens; IOL intraocular lens; PCIOL posterior chamber intraocular lens; Phaco/IOL phacoemulsification with intraocular lens placement; [place] photorefractive keratectomy; LASIK laser assisted in situ keratomileusis; HTN hypertension; DM diabetes mellitus; COPD chronic obstructive pulmonary disease

## 2019-10-10 ENCOUNTER — Other Ambulatory Visit: Payer: Self-pay

## 2019-10-10 ENCOUNTER — Ambulatory Visit (INDEPENDENT_AMBULATORY_CARE_PROVIDER_SITE_OTHER): Payer: Medicare HMO | Admitting: Ophthalmology

## 2019-10-10 ENCOUNTER — Encounter (INDEPENDENT_AMBULATORY_CARE_PROVIDER_SITE_OTHER): Payer: Self-pay | Admitting: Ophthalmology

## 2019-10-10 ENCOUNTER — Telehealth: Payer: Self-pay | Admitting: Neurology

## 2019-10-10 DIAGNOSIS — H3322 Serous retinal detachment, left eye: Secondary | ICD-10-CM

## 2019-10-10 DIAGNOSIS — H3581 Retinal edema: Secondary | ICD-10-CM | POA: Diagnosis not present

## 2019-10-10 DIAGNOSIS — Z961 Presence of intraocular lens: Secondary | ICD-10-CM

## 2019-10-10 DIAGNOSIS — H1820 Unspecified corneal edema: Secondary | ICD-10-CM

## 2019-10-10 DIAGNOSIS — H3522 Other non-diabetic proliferative retinopathy, left eye: Secondary | ICD-10-CM | POA: Diagnosis not present

## 2019-10-10 DIAGNOSIS — H35352 Cystoid macular degeneration, left eye: Secondary | ICD-10-CM

## 2019-10-10 LAB — COMPREHENSIVE METABOLIC PANEL
ALT: 39 IU/L (ref 0–44)
AST: 40 IU/L (ref 0–40)
Albumin/Globulin Ratio: 1.6 (ref 1.2–2.2)
Albumin: 4.3 g/dL (ref 3.7–4.7)
Alkaline Phosphatase: 85 IU/L (ref 39–117)
BUN/Creatinine Ratio: 17 (ref 10–24)
BUN: 19 mg/dL (ref 8–27)
Bilirubin Total: 0.6 mg/dL (ref 0.0–1.2)
CO2: 22 mmol/L (ref 20–29)
Calcium: 9.8 mg/dL (ref 8.6–10.2)
Chloride: 106 mmol/L (ref 96–106)
Creatinine, Ser: 1.12 mg/dL (ref 0.76–1.27)
GFR calc Af Amer: 71 mL/min/{1.73_m2} (ref >59)
GFR calc non Af Amer: 62 mL/min/{1.73_m2} (ref >59)
Globulin, Total: 2.7 g/dL (ref 1.5–4.5)
Glucose: 94 mg/dL (ref 65–99)
Potassium: 4.7 mmol/L (ref 3.5–5.2)
Sodium: 141 mmol/L (ref 134–144)
Total Protein: 7 g/dL (ref 6.0–8.5)

## 2019-10-10 LAB — CBC WITH DIFFERENTIAL
Basophils Absolute: 0.1 10*3/uL (ref 0.0–0.2)
Basos: 1 %
EOS (ABSOLUTE): 0.3 10*3/uL (ref 0.0–0.4)
Eos: 3 %
Hematocrit: 42.5 % (ref 37.5–51.0)
Hemoglobin: 13.2 g/dL (ref 13.0–17.7)
Immature Grans (Abs): 0 10*3/uL (ref 0.0–0.1)
Immature Granulocytes: 1 %
Lymphocytes Absolute: 1.7 10*3/uL (ref 0.7–3.1)
Lymphs: 20 %
MCH: 26.2 pg — ABNORMAL LOW (ref 26.6–33.0)
MCHC: 31.1 g/dL — ABNORMAL LOW (ref 31.5–35.7)
MCV: 85 fL (ref 79–97)
Monocytes Absolute: 0.6 10*3/uL (ref 0.1–0.9)
Monocytes: 7 %
Neutrophils Absolute: 5.7 10*3/uL (ref 1.4–7.0)
Neutrophils: 68 %
RBC: 5.03 x10E6/uL (ref 4.14–5.80)
RDW: 20.2 % — ABNORMAL HIGH (ref 11.6–15.4)
WBC: 8.3 10*3/uL (ref 3.4–10.8)

## 2019-10-10 NOTE — Telephone Encounter (Signed)
Please call patient, laboratory evaluation showed no significant abnormality. 

## 2019-10-10 NOTE — Telephone Encounter (Signed)
I spoke to the patient's wife and she verbalized understanding of his lab results.

## 2019-10-24 DIAGNOSIS — Z789 Other specified health status: Secondary | ICD-10-CM | POA: Diagnosis not present

## 2019-10-24 DIAGNOSIS — Z5181 Encounter for therapeutic drug level monitoring: Secondary | ICD-10-CM | POA: Diagnosis not present

## 2019-10-24 DIAGNOSIS — G1221 Amyotrophic lateral sclerosis: Secondary | ICD-10-CM | POA: Diagnosis not present

## 2019-10-24 DIAGNOSIS — M6281 Muscle weakness (generalized): Secondary | ICD-10-CM | POA: Diagnosis not present

## 2019-10-24 DIAGNOSIS — Z79899 Other long term (current) drug therapy: Secondary | ICD-10-CM | POA: Diagnosis not present

## 2019-10-25 ENCOUNTER — Telehealth: Payer: Self-pay | Admitting: Neurology

## 2019-10-25 NOTE — Telephone Encounter (Signed)
FYI-Pt's wife called wanting to inform the provider that the pt has decided to stay with [facility] for his ALS Condition but wanted to express their gratitude for all of the help they received here.

## 2019-10-31 ENCOUNTER — Other Ambulatory Visit: Payer: Self-pay

## 2019-10-31 ENCOUNTER — Ambulatory Visit: Payer: Medicare HMO | Admitting: Pulmonary Disease

## 2019-10-31 ENCOUNTER — Encounter: Payer: Self-pay | Admitting: Pulmonary Disease

## 2019-10-31 VITALS — BP 122/70 | HR 72 | Temp 98.2°F | Ht 73.0 in | Wt 179.8 lb

## 2019-10-31 DIAGNOSIS — J449 Chronic obstructive pulmonary disease, unspecified: Secondary | ICD-10-CM | POA: Diagnosis not present

## 2019-10-31 DIAGNOSIS — R918 Other nonspecific abnormal finding of lung field: Secondary | ICD-10-CM | POA: Diagnosis not present

## 2019-10-31 NOTE — Patient Instructions (Signed)
I am glad your breathing is stable We will get a follow-up CT in 1 year Follow-up in clinic after 1 year Continue Trelegy inhaler.

## 2019-10-31 NOTE — Progress Notes (Signed)
Jerome Mays    [id number]    [birth year]  Primary Care Physician:Aronson, Delfino Lovett, MD  Referring Physician: [name], MD [address] [place],  [place] [address]  Chief complaint: Follow-up for COPD GOLD B  HPI: Jerome Mays is a 79 Y/O with Gold stage B COPD (CAT score 8, 1 exacerbation). He has 80-pack-year smoking history.  Quit in 1997.  Symptoms of cough, chest tightness, congestion for the past week after he was exposed to pollen and dust while mowing hay. He denies any fevers, chills, purulent sputum. He feels that the Daliresp is affecting his sleep and he is taking zzquil at night to fall asleep. He had an evaluation in January 2018 for minor hemoptysis. He had a CT scan of the chest which did not show any obvious source of hemoptysis.   Taken off Daliresp in 2018 as he cannot afford the cost and due to side effects of insomnia.  Interim History: Breathing is stable on Trelegy inhaler  Since his last visit he has been diagnosed with ALS and follows at [facility].  Currently on riluzole therapy.   Outpatient Encounter Medications as of 10/31/2019  Medication Sig  . allopurinol (ZYLOPRIM) 300 MG tablet Take 300 mg by mouth daily.   [name] aspirin EC 81 MG tablet Take 81 mg by mouth daily.  [name] b complex vitamins tablet Take 1 tablet by mouth daily.  . B Complex-C (B-COMPLEX WITH VITAMIN C) tablet Take 1 tablet by mouth daily.  . cetirizine (ZYRTEC) 10 MG tablet Take 10 mg by mouth daily.  . cholecalciferol (VITAMIN D3) 25 MCG (1000 UT) tablet Take 1,000 Units by mouth daily.  . clobetasol cream (TEMOVATE) [id number] % Apply 1 application topically 2 (two) times daily.  . Fluticasone-Umeclidin-Vilant (TRELEGY ELLIPTA) 100-62.5-25 MCG/INH AEPB Inhale 1 puff into the lungs daily.  . hydrOXYzine (ATARAX/VISTARIL) 10 MG tablet Take 10 mg by mouth at bedtime.  [name] ketoconazole (NIZORAL) 2 % shampoo Apply 1 application topically 2 (two) times a week.  [name] ketorolac (ACULAR) 0.5 %  ophthalmic solution Place 1 drop into the left eye 4 (four) times daily.  . Multiple Vitamin (ONE-A-DAY MENS PO) Take 1 tablet by mouth daily.  . prednisoLONE acetate (PRED FORTE) 1 % ophthalmic suspension Place 1 drop into the left eye 4 (four) times daily.  . riluzole (RILUTEK) 50 MG tablet Take 1 tablet (50 mg total) by mouth every 12 (twelve) hours.  . sodium chloride (MURO 128) 5 % ophthalmic solution Place 1 drop into the left eye 2 (two) times a day. (Patient taking differently: Place 1 drop into the left eye in the morning, at noon, in the evening, and at bedtime. )   No facility-administered encounter medications on file as of 10/31/2019.   Physical Exam: Blood pressure 122/70, pulse 72, temperature 98.2 F (36.8 C), temperature source Oral, height 6\' 1"  (1.854 m), weight 179 lb 12.8 oz (81.6 kg), SpO2 92 %. Gen:      No acute distress HEENT:  EOMI, sclera anicteric Neck:     No masses; no thyromegaly Lungs:    Clear to auscultation bilaterally; normal respiratory effort CV:         Regular rate and rhythm; no murmurs Abd:      + bowel sounds; soft, non-tender; no palpable masses, no distension Ext:    No edema; adequate peripheral perfusion Skin:      Warm and dry; no rash Neuro: alert and oriented x  3 Psych: normal mood and affect  Data Reviewed: Imaging CT scan 10/21/09- Moderate centrilobar emphysema. No pulmonary masses, opacities, asbestos disease or consolidation. Stable enlarged. Stable enlarged mediastinal and bilateral hilar lymph nodes unchanged for past 3.5 years compatible with benign or reactive process.  CT scan 06/19/16-emphysema, no consolidation or lung opacity. Mediastinal and bilateral hilar lymphadenopathy stable in size. Clustered left upper lobe nodules.  CT scan 07/01/17- stable subcentimeter pulmonary nodules, stable calcified mediastinal, hilar lymphadenopathy.  CT scan 07/25/2019-stable pulmonary nodules, calcified mediastinal, hilar lymphadenopathy I  have reviewed the images personally.  PFTs 02/04/16 FVC 4.05 [9%), FEV1 2.40 (70%), F/F 59, TLC 101%, DLCO 35% Moderate obstructive defect with severe reduction in diffusion capacity.  Labs CBC 05/21/17-WBC 7.2, eosinophils 3.7%, absolute eos count 266 Blood allergy profile 05/21/17-IgE 22, RAST panel is negative.     Assessment:  Gold stage B COPD. Symptoms are stable on trelegy inhaler. Continue current therapy.  Minor hemoptysis, in early 2018 Subcm pulm nodules He has no recurrence of hemoptysis and no obvious source on CT scan. He has new tiny left upper lobe nodules and mediastinal LNs which has remained stable on follow up CT. Follow-up in 1 year with repeat scan.  Continued stability is demonstrated then we can stop imaging.  Health maintenance 01/08/2014-Prevnar 13 06/09/2007-Pneumovax  Plan/Recommendations: - Continue trelegy inhaler  Follow-up in 1 year  Marshell Garfinkel MD [facility] 10/31/2019, 10:06 AM  CC: [name], MD

## 2019-11-03 ENCOUNTER — Ambulatory Visit: Payer: Medicare HMO | Admitting: Physical Therapy

## 2019-11-20 DIAGNOSIS — G1221 Amyotrophic lateral sclerosis: Secondary | ICD-10-CM | POA: Diagnosis not present

## 2019-11-23 NOTE — Progress Notes (Signed)
. [facility] Note  11/27/2019     CHIEF COMPLAINT Patient presents for Retina Follow Up   HISTORY OF PRESENT ILLNESS: Jerome Mays is a 79 y.o. male who presents to the clinic today for:   HPI    Retina Follow Up    In both eyes.  Severity is moderate.  Duration of 7 weeks.  Since onset it is stable.  I, the attending physician,  performed the HPI with the patient and updated documentation appropriately.          Comments    6-8 week retina follow up. Patient states vision is the same.CME check/K       Last edited by [name], MD on 11/27/2019 12:17 PM. (History)    Patient spent 2 days in [place] getting a full work up for ALS, pt states he is unable to lift arms due to muscle weakness, but he feels like the disease is progressing slowly, he states no change in vision since last visit   Referring physician: [name], MD [place],  [place] [id number]  HISTORICAL INFORMATION:   Selected notes from the [facility] Initial referral from Dr. [name] for RD OS   CURRENT MEDICATIONS: Current Outpatient Medications (Ophthalmic Drugs)  Medication Sig   ketorolac (ACULAR) 0.5 % ophthalmic solution Place 1 drop into the left eye 4 (four) times daily.   prednisoLONE acetate (PRED FORTE) 1 % ophthalmic suspension Place 1 drop into the left eye 4 (four) times daily.   sodium chloride (MURO 128) 5 % ophthalmic solution Place 1 drop into the left eye 2 (two) times a day. (Patient taking differently: Place 1 drop into the left eye in the morning, at noon, in the evening, and at bedtime. )   No current facility-administered medications for this visit. (Ophthalmic Drugs)   Current Outpatient Medications (Other)  Medication Sig   allopurinol (ZYLOPRIM) 300 MG tablet Take 300 mg by mouth daily.    aspirin EC 81 MG tablet Take 81 mg by mouth daily.   b complex vitamins tablet Take 1 tablet by mouth daily.   B Complex-C  (B-COMPLEX WITH VITAMIN C) tablet Take 1 tablet by mouth daily.   cetirizine (ZYRTEC) 10 MG tablet Take 10 mg by mouth daily.   cholecalciferol (VITAMIN D3) 25 MCG (1000 UT) tablet Take 1,000 Units by mouth daily.   clobetasol cream (TEMOVATE) 7.89 % Apply 1 application topically 2 (two) times daily.   Fluticasone-Umeclidin-Vilant (TRELEGY ELLIPTA) 100-62.5-25 MCG/INH AEPB Inhale 1 puff into the lungs daily.   hydrOXYzine (ATARAX/VISTARIL) 10 MG tablet Take 10 mg by mouth at bedtime.   ketoconazole (NIZORAL) 2 % shampoo Apply 1 application topically 2 (two) times a week.   Multiple Vitamin (ONE-A-DAY MENS PO) Take 1 tablet by mouth daily.   riluzole (RILUTEK) 50 MG tablet Take 1 tablet (50 mg total) by mouth every 12 (twelve) hours.   No current facility-administered medications for this visit. (Other)      REVIEW OF SYSTEMS: ROS    Positive for: Cardiovascular, Eyes   Negative for: Constitutional, Gastrointestinal, Neurological, Skin, Genitourinary, Musculoskeletal, HENT, Endocrine, Respiratory, Psychiatric, Allergic/Imm, Heme/Lymph   Last edited by Elmore Guise, COT on 11/27/2019  9:12 AM. (History)       ALLERGIES Allergies  Allergen Reactions   Lipitor [Atorvastatin] Rash and Other (See Comments)    Passed out    PAST MEDICAL HISTORY Past Medical History:  Diagnosis Date  Anemia    low iron   Arthritis    Colonic polyp    Constipation    COPD (chronic obstructive pulmonary disease) (HCC)    GERD (gastroesophageal reflux disease)    Gout    History of kidney stones    Malignant tumor of kidney ([facility])    Muscle atrophy    Pain    LOWER BACK AND LEFT HIP - HX OF PREVIOUS LUMBAR AND CERVICAL SURGERY--PT STATES HE HAS HAD NUMBNESS IN BOTH FEET SINCE HIS BACK SURGERY   Renal calculus    Retinal detachment    Past Surgical History:  Procedure Laterality Date   BACK SURGERY     LUMBAR SURGERY   CATARACT EXTRACTION Bilateral 2003   Surgeon  unknown   EYE SURGERY     BILATERAL CATARACT EXTRACTIONS   GIVENS CAPSULE STUDY N/A 10/07/2015   Procedure: GIVENS CAPSULE STUDY;  Surgeon: Carol Ada, MD;  Location: [facility] ENDOSCOPY;  Service: Endoscopy;  Laterality: N/A;   HAND SURGERY     left   KNEE SURGERY     right   LAMINECTOMY     cervical   LASER PHOTO ABLATION Left 08/19/2017   Procedure: LASER PHOTO ABLATION;  Surgeon: [name], MD;  Location: [facility];  Service: Ophthalmology;  Laterality: Left;   MEMBRANE PEEL Left 03/22/2017   Procedure: POSSIBLE MEMBRANE PEEL WITH SILICONE OIL AND PERFLURON;  Surgeon: [name], MD;  Location: [facility];  Service: Ophthalmology;  Laterality: Left;   NEPHRECTOMY     PT STATES RIGHT KIDNEY WAS REMOVED   PAROTIDECTOMY Right 06/13/2018   Procedure: RIGHT TOTAL PAROTIDECTOMY;  Surgeon: Leta Baptist, MD;  Location: [facility];  Service: ENT;  Laterality: Right;   PARS PLANA VITRECTOMY Left 03/22/2017   Procedure: PARS PLANA VITRECTOMY WITH 25 GAUGE;  Surgeon: [name], MD;  Location: [facility];  Service: Ophthalmology;  Laterality: Left;   PARS PLANA VITRECTOMY W/ SCLERAL BUCKLE Left 08/19/2017   Procedure: 56 GAUGE PARS PLANA VITRECTOMY WITH SILCONE OIL REMOVAL;  Surgeon: [name], MD;  Location: [facility];  Service: Ophthalmology;  Laterality: Left;   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   SALIVARY STONE REMOVAL Right 02/21/2019   Procedure: RIGHT PAROTID FISTULA REPAIR;  Surgeon: Leta Baptist, MD;  Location: [facility];  Service: ENT;  Laterality: Right;   SHOULDER SURGERY  2020   SC[facility]   TOTAL KNEE ARTHROPLASTY Left 10/19/2012   Procedure: LEFT TOTAL KNEE ARTHROPLASTY;  Surgeon: Magnus Sinning, MD;  Location: [facility];  Service: Orthopedics;  Laterality: Left;   VITRECTOMY 25 GAUGE WITH SCLERAL BUCKLE Left 02/17/2017   Procedure: VITRECTOMY 25 GAUGE WITH SCLERAL BUCKLE membrane peel, injection of silicone oil, and endolaser photocoagulation.;  Surgeon: [name], MD;  Location: [facility];  Service: Ophthalmology;  Laterality: Left;    FAMILY HISTORY Family History  Problem Relation Age of Onset   Diabetes Mother    COPD Brother    COPD Sister    Heart attack Father    Amblyopia Neg Hx    Blindness Neg Hx    Cataracts Neg Hx    Glaucoma Neg Hx    Macular degeneration Neg Hx    Retinal detachment Neg Hx    Retinitis pigmentosa Neg Hx     SOCIAL HISTORY Social History   Tobacco Use   Smoking status: Former Smoker    Packs/day: 2.00    Years: 40.00    Pack years: 80.00  Types: Cigarettes, Pipe    Quit date: 06/09/1995    Years since quitting: 24.4   Smokeless tobacco: Never Used  Vaping Use   Vaping Use: Never used  Substance Use Topics   Alcohol use: No   Drug use: No         OPHTHALMIC EXAM:  Base Eye Exam    Visual Acuity (Snellen - Linear)      Right Left   Dist cc 20/20-2 20/80-2   Dist ph cc  20/NI   Correction: Glasses       Tonometry (Tonopen, 9:14 AM)      Right Left   Pressure 14 19       Pupils      Dark Light Shape React APD   Right 3 2 Round Brisk None   Left 5 5 Round NR None       Visual Fields      Left Right    Full        Extraocular Movement      Right Left    Full Full       Neuro/Psych    Oriented x3: Yes   Mood/Affect: Normal       Dilation    Both eyes: 1.0% Mydriacyl, 2.5% Phenylephrine @ 9:14 AM        Slit Lamp and Fundus Exam    External Exam      Right Left   External Normal Periorbital edema improving       Slit Lamp Exam      Right Left   Lids/Lashes Dermatochalasis - upper lid Dermatochalasis - upper lid   Conjunctiva/Sclera White and quiet White and quiet, residual STK ST quad - gone   Cornea Arcus, mild tear film debris arcus, tear film debris, 1+ PEE, mild corneal edema and haze, Debris in tear film   Anterior Chamber Deep and quiet Deep, quiet   Iris Round and dilated Round and moderately dilated   Lens Three piece Posterior chamber  intraocular lens, trace PCO Three piece Posterior chamber intraocular lens in good position, micro condensations on posterior lens surface - stable   Vitreous Vitreous syneresis, PVD Post vitrectomy       Fundus Exam      Right Left   Disc Pink and Sharp, mild Peripapillary atrophy Sharp rim, mild pallor, temporal Peripapillary atrophy, +cupping   C/D Ratio 0.4 0.6   Macula Flat, good foveal reflex, Retinal pigment epithelial mottling - mild, No heme or edema  flat; Blunted foveal reflex, mild cystic changes temporal macula - slightly increased   Vessels Vascular attenuation, Tortuous Vascular attenuation   Periphery Attached, mild Reticular degeneration; no RT or RD Attached, moderate buckle height; temporal/inf retinectomy from 2-630 with peripheral edge attached with good laser at the edge; trace areas of fibrosis inferiorly and superior temporal -- surrounded by good laser        Refraction    Wearing Rx      Sphere Cylinder Axis Add   Right -0.75 +1.00 151 +2.50   Left -0.50 +1.00 020 +2.50   Type: PAL          IMAGING AND PROCEDURES  Imaging and Procedures for 09/19/17  OCT, Retina - OU - Both Eyes       Right Eye Quality was good. Central Foveal Thickness: 248. Progression has been stable. Findings include normal foveal contour, no IRF, no SRF.   Left Eye Quality was good. Central Foveal Thickness: 242. Progression has worsened.  Findings include no SRF, epiretinal membrane, intraretinal fluid, abnormal foveal contour (Patchy ORA, mild interval increase IRF/CME temporal macula).   Notes Images taken, stored on drive  Diagnosis / Impression:  OD: NFP, No IRF, No SRF OS: retina attached with oil out - Patchy ORA; mild interval increase IRF/CME temporal macula  Clinical management:  See below  Abbreviations: NFP - Normal foveal profile. CME - cystoid macular edema. PED - pigment epithelial detachment. IRF - intraretinal fluid. SRF - subretinal fluid. EZ - ellipsoid  zone. ERM - epiretinal membrane. ORA - outer retinal atrophy. ORT - outer retinal tubulation. SRHM - subretinal hyper-reflective material         Injection into Tenon's Capsule - OS - Left Eye       Time Out 11/27/2019. 10:09 AM. Confirmed correct patient, procedure, site, and patient consented.   Anesthesia Topical anesthesia was used. Anesthetic medications included Lidocaine 2%, Proparacaine 0.5%.   Procedure Preparation included 5% betadine to ocular surface, eyelid speculum. A 27 gauge needle was used.   Injection:  4 mg KENALOG-73m/ml injection 460m  NDC: 073151-7616-07Lot: [id number] date: 05/04/2021   Route: Other, Site: Left Eye  Post-op Post injection exam found visual acuity of at least counting fingers. The patient tolerated the procedure well. There were no complications. The patient received written and verbal post procedure care education. Post injection medications included erythromycin (Polymyxin B sulfate and trimethoprim).   Notes 0.8 cc of Kenalog-40 (32 mg) injected into subtenon's capsule in the superotemporal quadrant. Betadine was applied to Injection area pre and post-injection then rinsed with sterile BSS. 1 drop of polymixin was instilled into the eye. There were no complications. Pt tolerated procedure well.                 ASSESSMENT/PLAN:    ICD-10-CM   1. 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 Cystoid macular edema of left eye  H35.352 Injection into Tenon's Capsule - OS - Left Eye    triamcinolone acetonide (KENALOG-40) injection 4 mg  4. Retinal edema  H35.81 OCT, Retina - OU - Both Eyes  5. Pseudophakia, both eyes  Z96.1   6. Corneal edema of left eye  H18.20     1-4. 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  - inferior detachment from 2-10 oclock with HST at ~530  - fovea off, but sup nasal macula attached  - by history RD likely started 4-5 wks prior to  initial presentation, but fovea became involved ~Saturday, 02/13/17  - s/p SBP (42 band) + 25g PPV/PFC/EL/FAX/SO OS 9.12.18  - extensive PVR along inf temp arcades with tractional redetachment inf temp noted POM1  - s/p PPV w/ membrane peel/relaxing retinectomy/PFC/SOX OS under GA, 10.15.18  - s/p PPV/SOR/EL OS, 3.14.19  - lost to f/u from 12.19.19 to 7.14.20 due to shoulder and parotid surgeries and COVID-19 restrictions  - lost to f/u from 09.23.20 to 03.23.21 due to new diagnosis of motor neuron disease/ALS  - retina attached and in good position  - s/p STK OS #1 (07.14.20), #2 (3.23.21) for CME  - currently on PF QID and Ketorolac TID OS for CME  - OCT today shows mild interval increase in focal IRF/CME temporal macula OS  - BCVA remains stable at 20/80 OS  - recommend STK OS #3 today, 06.21.21  - pt wishes to proceed with injection  - RBA of procedure discussed, questions answered  -  informed consent obtained and signed  - see procedure note  - corneal edema slightly improved-- stay on muro 128 OS  - continue PF QID and ketorolac TID OS  - IOP 19 today -- good off Alphagan P  - f/u 3 months for K and CME check  5. Pseudophakia OU  - IOLs in good position OU  - monitor  6. Corneal edema OS -- improved  - persistent post op corneal edema OS as above  - cont Muro 128 BID as above   Ophthalmic Meds Ordered this visit:  Meds ordered this encounter  Medications   triamcinolone acetonide (KENALOG-40) injection 4 mg       Return in about 3 months (around 02/27/2020) for f/u CME OS, DFE, OCT.  There are no Patient Instructions on file for this visit.   Explained the diagnoses, plan, and follow up with the patient and they expressed understanding.  Patient expressed understanding of the importance of proper follow up care.  This document serves as a record of services personally performed by Gardiner Sleeper, MD, PhD. It was created on their behalf by Leeann Must, [place], a  certified ophthalmic assistant. The creation of this record is the provider's dictation and/or activities during the visit.    Electronically signed by: Leeann Must, COA _0 @ 12:20 PM  Gardiner Sleeper, M.D., Ph.D. Diseases & Surgery of the Retina and Vitreous [facility]  I have reviewed the above documentation for accuracy and completeness, and I agree with the above. Gardiner Sleeper, M.D., Ph.D. 11/27/19 12:20 PM   Abbreviations: M myopia (nearsighted); A astigmatism; H hyperopia (farsighted); P presbyopia; Mrx spectacle prescription;  CTL contact lenses; OD right eye; OS left eye; OU both eyes  XT exotropia; ET esotropia; PEK punctate epithelial keratitis; PEE punctate epithelial erosions; DES dry eye syndrome; MGD meibomian gland dysfunction; ATs artificial tears; PFAT's preservative free artificial tears; [place] nuclear sclerotic cataract; PSC posterior subcapsular cataract; ERM epi-retinal membrane; PVD posterior vitreous detachment; RD retinal detachment; DM diabetes mellitus; DR diabetic retinopathy; NPDR non-proliferative diabetic retinopathy; PDR proliferative diabetic retinopathy; CSME clinically significant macular edema; DME diabetic macular edema; dbh dot blot hemorrhages; CWS cotton wool spot; POAG primary open angle glaucoma; C/D cup-to-disc ratio; HVF humphrey visual field; GVF goldmann visual field; OCT optical coherence tomography; IOP intraocular pressure; BRVO Branch retinal vein occlusion; CRVO central retinal vein occlusion; CRAO central retinal artery occlusion; BRAO branch retinal artery occlusion; RT retinal tear; SB scleral buckle; PPV pars plana vitrectomy; VH Vitreous hemorrhage; PRP panretinal laser photocoagulation; IVK intravitreal kenalog; VMT vitreomacular traction; MH Macular hole;  NVD neovascularization of the disc; NVE neovascularization elsewhere; AREDS age related eye disease study; ARMD age related macular degeneration; POAG primary open  angle glaucoma; EBMD epithelial/anterior basement membrane dystrophy; ACIOL anterior chamber intraocular lens; IOL intraocular lens; PCIOL posterior chamber intraocular lens; Phaco/IOL phacoemulsification with intraocular lens placement; [place] photorefractive keratectomy; LASIK laser assisted in situ keratomileusis; HTN hypertension; DM diabetes mellitus; COPD chronic obstructive pulmonary disease

## 2019-11-27 ENCOUNTER — Ambulatory Visit (INDEPENDENT_AMBULATORY_CARE_PROVIDER_SITE_OTHER): Payer: Medicare HMO | Admitting: Ophthalmology

## 2019-11-27 ENCOUNTER — Encounter (INDEPENDENT_AMBULATORY_CARE_PROVIDER_SITE_OTHER): Payer: Self-pay | Admitting: Ophthalmology

## 2019-11-27 ENCOUNTER — Other Ambulatory Visit: Payer: Self-pay

## 2019-11-27 DIAGNOSIS — Z961 Presence of intraocular lens: Secondary | ICD-10-CM | POA: Diagnosis not present

## 2019-11-27 DIAGNOSIS — H3581 Retinal edema: Secondary | ICD-10-CM

## 2019-11-27 DIAGNOSIS — H35352 Cystoid macular degeneration, left eye: Secondary | ICD-10-CM

## 2019-11-27 DIAGNOSIS — H3322 Serous retinal detachment, left eye: Secondary | ICD-10-CM

## 2019-11-27 DIAGNOSIS — H3522 Other non-diabetic proliferative retinopathy, left eye: Secondary | ICD-10-CM | POA: Diagnosis not present

## 2019-11-27 DIAGNOSIS — H1820 Unspecified corneal edema: Secondary | ICD-10-CM

## 2019-11-27 MED ORDER — TRIAMCINOLONE ACETONIDE 40 MG/ML IJ SUSP FOR KALEIDOSCOPE
4.0000 mg | INTRAMUSCULAR | Status: AC | PRN
  Administered 2019-11-27: 4 mg

## 2019-12-18 ENCOUNTER — Emergency Department (HOSPITAL_COMMUNITY): Payer: Medicare HMO

## 2019-12-18 ENCOUNTER — Other Ambulatory Visit: Payer: Self-pay

## 2019-12-18 ENCOUNTER — Emergency Department (HOSPITAL_COMMUNITY)
Admission: EM | Admit: 2019-12-18 | Discharge: 2019-12-19 | Disposition: A | Discharge status: Home / Self Care | Payer: Medicare HMO | Attending: Emergency Medicine | Admitting: Emergency Medicine

## 2019-12-18 ENCOUNTER — Encounter (HOSPITAL_COMMUNITY): Payer: Self-pay

## 2019-12-18 DIAGNOSIS — Z96652 Presence of left artificial knee joint: Secondary | ICD-10-CM | POA: Insufficient documentation

## 2019-12-18 DIAGNOSIS — Z87891 Personal history of nicotine dependence: Secondary | ICD-10-CM | POA: Diagnosis not present

## 2019-12-18 DIAGNOSIS — Z79899 Other long term (current) drug therapy: Secondary | ICD-10-CM | POA: Diagnosis not present

## 2019-12-18 DIAGNOSIS — K30 Functional dyspepsia: Secondary | ICD-10-CM

## 2019-12-18 DIAGNOSIS — R079 Chest pain, unspecified: Secondary | ICD-10-CM | POA: Diagnosis not present

## 2019-12-18 DIAGNOSIS — R0789 Other chest pain: Secondary | ICD-10-CM | POA: Diagnosis not present

## 2019-12-18 DIAGNOSIS — Z85528 Personal history of other malignant neoplasm of kidney: Secondary | ICD-10-CM | POA: Diagnosis not present

## 2019-12-18 DIAGNOSIS — R7309 Other abnormal glucose: Secondary | ICD-10-CM

## 2019-12-18 DIAGNOSIS — J9811 Atelectasis: Secondary | ICD-10-CM | POA: Diagnosis not present

## 2019-12-18 DIAGNOSIS — M79602 Pain in left arm: Secondary | ICD-10-CM | POA: Diagnosis not present

## 2019-12-18 DIAGNOSIS — J449 Chronic obstructive pulmonary disease, unspecified: Secondary | ICD-10-CM | POA: Insufficient documentation

## 2019-12-18 DIAGNOSIS — R739 Hyperglycemia, unspecified: Secondary | ICD-10-CM | POA: Diagnosis not present

## 2019-12-18 LAB — BASIC METABOLIC PANEL
Anion gap: 11 (ref 5–15)
BUN: 21 mg/dL (ref 8–23)
CO2: 22 mmol/L (ref 22–32)
Calcium: 9.8 mg/dL (ref 8.9–10.3)
Chloride: 107 mmol/L (ref 98–111)
Creatinine, Ser: 1.1 mg/dL (ref 0.61–1.24)
GFR calc Af Amer: >60 mL/min (ref >60)
GFR calc non Af Amer: >60 mL/min (ref >60)
Glucose, Bld: 168 mg/dL — ABNORMAL HIGH (ref 70–99)
Potassium: 3.9 mmol/L (ref 3.5–5.1)
Sodium: 140 mmol/L (ref 135–145)

## 2019-12-18 LAB — CBC
HCT: 42.2 % (ref 39.0–52.0)
Hemoglobin: 13 g/dL (ref 13.0–17.0)
MCH: 28.4 pg (ref 26.0–34.0)
MCHC: 30.8 g/dL (ref 30.0–36.0)
MCV: 92.3 fL (ref 80.0–100.0)
Platelets: 193 10*3/uL (ref 150–400)
RBC: 4.57 MIL/uL (ref 4.22–5.81)
RDW: 16.8 % — ABNORMAL HIGH (ref 11.5–15.5)
WBC: 8 10*3/uL (ref 4.0–10.5)
nRBC: 0 % (ref 0.0–0.2)

## 2019-12-18 LAB — TROPONIN I (HIGH SENSITIVITY): Troponin I (High Sensitivity): 7 ng/L (ref <18)

## 2019-12-18 MED ORDER — SODIUM CHLORIDE 0.9% FLUSH: 3.0000 mL | Freq: Once | INTRAVENOUS | Status: DC

## 2019-12-18 NOTE — ED Triage Notes (Addendum)
Pt reports indigestion and Left arm pain starting yesterday. Seen at his PCP and sent here for further evaluation. Pt denies any pain today only here because his\ PCP said he needed to be seen

## 2019-12-18 NOTE — ED Notes (Signed)
When checking vitals patient ocxygen sast dropped to 88%RA. Triage RN Mortimer Fries states to put patient on 2L O2.

## 2019-12-19 LAB — TROPONIN I (HIGH SENSITIVITY): Troponin I (High Sensitivity): 9 ng/L (ref <18)

## 2019-12-19 MED ORDER — PANTOPRAZOLE SODIUM 40 MG PO TBEC
40.0000 mg | DELAYED_RELEASE_TABLET | Freq: Once | ORAL | Status: AC
  Administered 2019-12-19: 40 mg via ORAL
  Filled 2019-12-19: qty 1

## 2019-12-19 MED ORDER — PANTOPRAZOLE SODIUM 40 MG PO TBEC: 40.0000 mg | DELAYED_RELEASE_TABLET | Freq: Every day | ORAL | 0 refills | Status: DC

## 2019-12-19 NOTE — Discharge Instructions (Addendum)
Continue taking aspirin every day.  Make an appointment with the cardiologist.  Follow up with your primary care provider regarding your indigestion and gas.  Return if you are having any problems. If you have more chest pain, return immediately!

## 2019-12-19 NOTE — ED Notes (Signed)
Patient verbalizes understanding of discharge instructions. Opportunity for questioning and answers were provided. Armband removed by staff, pt discharged from ED. Pt. ambulatory and discharged home.  

## 2019-12-19 NOTE — ED Provider Notes (Signed)
[facility] EMERGENCY DEPARTMENT Provider Note   CSN: [id number] Arrival date & time: 12/18/19  1517   History Chief Complaint  Patient presents with   Chest Pain    Jerome Mays is a 79 y.o. male.  The history is provided by the patient.  Chest Pain He has history of COPD, ALS and comes in because of an episode of chest and left arm pain.  This occurred on July 11.  He has difficulty describing the pain, but it came on at rest and only lasted a few minutes before resolving.  There is no associated dyspnea, nausea, diaphoresis.  He has also been complaining of a lot of gas and indigestion.  He went to see his PCP who referred him to the emergency department.  He has had no symptoms since his episode on July 11.  He denies history of hypertension, diabetes, hyperlipidemia.  There is no family history of premature coronary atherosclerosis.  He is a former smoker.  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   Weakness 05/15/2019   Muscular atrophy 05/15/2019   Fasciculation 05/15/2019   Muscle weakness (generalized) 05/15/2019   H/O parotidectomy 06/13/2018   COPD, group B, by GOLD 2017 classification ([place]) 02/02/2017   Hemoptysis 06/18/2016   Stiffness of left knee 11/08/2012   Difficulty in walking(719.7) 11/08/2012   Gout 03/04/2012   ENLARGEMENT OF LYMPH NODES 08/21/2009   CLOS FRACTURE MID/PROXIMAL  PHALANX/PHALANG HAND 07/31/2009   NEOPLASM, MALIGNANT, 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   COLONIC POLYPS, HX OF 12/05/2007   RENAL CALCULUS, HX OF 12/05/2007    Past Surgical History:  Procedure Laterality Date   BACK SURGERY     LUMBAR SURGERY   CATARACT EXTRACTION Bilateral 2003   Surgeon unknown   EYE SURGERY     BILATERAL CATARACT EXTRACTIONS   GIVENS CAPSULE STUDY N/A 10/07/2015   Procedure: GIVENS CAPSULE STUDY;  Surgeon: Carol Ada, MD;  [facility];  Service: Endoscopy;  Laterality: N/A;   HAND SURGERY     left   KNEE SURGERY     right   LAMINECTOMY     cervical   LASER PHOTO ABLATION Left 08/19/2017   Procedure: LASER PHOTO ABLATION;  Surgeon: [name], MD;  Location: [place];  Service: Ophthalmology;  Laterality: Left;   MEMBRANE PEEL Left 03/22/2017   Procedure: POSSIBLE MEMBRANE PEEL WITH SILICONE OIL AND PERFLURON;  Surgeon: [name], MD;  Location: [place];  Service: Ophthalmology;  Laterality: Left;   NEPHRECTOMY     PT STATES RIGHT KIDNEY WAS REMOVED   PAROTIDECTOMY Right 06/13/2018   Procedure: RIGHT TOTAL PAROTIDECTOMY;  Surgeon: Leta Baptist, MD;  Location: [place];  Service: ENT;  Laterality: Right;   PARS PLANA VITRECTOMY  Left 03/22/2017   Procedure: PARS PLANA VITRECTOMY WITH 25 GAUGE;  Surgeon: [name], MD;  Location: [place];  Service: Ophthalmology;  Laterality: Left;   PARS PLANA VITRECTOMY W/ SCLERAL BUCKLE Left 08/19/2017   Procedure: 59 GAUGE PARS PLANA VITRECTOMY WITH SILCONE OIL REMOVAL;  Surgeon: [name], MD;  Location: [place];  Service: Ophthalmology;  Laterality: Left;   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   SALIVARY STONE REMOVAL Right 02/21/2019   Procedure: RIGHT PAROTID FISTULA REPAIR;  Surgeon: Leta Baptist, MD;  Location: [place];  Service: ENT;  Laterality: Right;   SHOULDER SURGERY  2020   SCA   TOTAL KNEE ARTHROPLASTY Left 10/19/2012    Procedure: LEFT TOTAL KNEE ARTHROPLASTY;  Surgeon: Magnus Sinning, MD;  Location: [facility];  Service: Orthopedics;  Laterality: Left;   VITRECTOMY 25 GAUGE WITH SCLERAL BUCKLE Left 02/17/2017   Procedure: VITRECTOMY 25 GAUGE WITH SCLERAL BUCKLE membrane peel, injection of silicone oil, and endolaser photocoagulation.;  Surgeon: [name], MD;  Location: [place];  Service: Ophthalmology;  Laterality: Left;       Family History  Problem Relation Age of Onset   Diabetes Mother    COPD Brother    COPD Sister    Heart attack Father    Amblyopia Neg Hx    Blindness Neg Hx    Cataracts Neg Hx    Glaucoma Neg Hx    Macular degeneration Neg Hx    Retinal detachment Neg Hx    Retinitis pigmentosa Neg Hx     Social History   Tobacco Use   Smoking status: Former Smoker    Packs/day: 2.00    Years: 40.00    Pack years: 80.00    Types: Cigarettes, Pipe    Quit date: 06/09/1995    Years since quitting: 24.5   Smokeless tobacco: Never Used  Vaping Use   Vaping Use: Never used  Substance Use Topics   Alcohol use: No   Drug use: No    Home Medications Prior to Admission medications   Medication Sig Start Date End Date Taking? Authorizing Provider  allopurinol (ZYLOPRIM) 300 MG tablet Take 300 mg by mouth daily.     [provider]  aspirin EC 81 MG tablet Take 81 mg by mouth daily.    [provider]  b complex vitamins tablet Take 1 tablet by mouth daily.    [provider]  B Complex-C (B-COMPLEX WITH VITAMIN C) tablet Take 1 tablet by mouth daily.    [provider]  cetirizine (ZYRTEC) 10 MG tablet Take 10 mg by mouth daily.    [provider]  cholecalciferol (VITAMIN D3) 25 MCG (1000 UT) tablet Take 1,000 Units by mouth daily.    [provider]  clobetasol cream (TEMOVATE) 4.12 % Apply 1 application topically 2 (two) times daily.    [provider]  Fluticasone-Umeclidin-Vilant (TRELEGY ELLIPTA)  100-62.5-25 MCG/INH AEPB Inhale 1 puff into the lungs daily. 05/03/19   Martyn Ehrich, NP  hydrOXYzine (ATARAX/VISTARIL) 10 MG tablet Take 10 mg by mouth at bedtime.    [provider]  ketoconazole (NIZORAL) 2 % shampoo Apply 1 application topically 2 (two) times a week.    [provider]  ketorolac (ACULAR) 0.5 % ophthalmic solution Place 1 drop into the left eye 4 (four) times daily. 08/29/19 08/28/20  [name], MD  Multiple Vitamin (ONE-A-DAY MENS PO) Take 1 tablet by mouth daily.    [provider]  prednisoLONE acetate (PRED FORTE) 1 % ophthalmic suspension Place 1 drop into the left eye 4 (four) times daily. 08/29/19   [name], MD  riluzole (RILUTEK) 50 MG tablet Take 1 tablet (50 mg total) by mouth every 12 (twelve) hours. 07/06/19   Marcial Pacas, MD  sodium chloride (MURO 128) 5 % ophthalmic solution Place 1 drop into the left eye 2 (two) times a day. Patient taking differently: Place 1 drop into the left eye in the morning, at noon, in the evening, and at bedtime.  12/20/18 12/20/19  [name], MD    Allergies    Lipitor [atorvastatin]  Review of Systems   Review of Systems  Cardiovascular: Positive for chest pain.  All other systems reviewed and are negative.   Physical Exam Updated Vital Signs BP (!) 124/91    Pulse 73    Temp 97.8 F (36.6 C) (Oral)    Resp 16    Ht 6\' 1"  (1.854 m)    Wt 83.9 kg    SpO2 94%    BMI 24.41 kg/m   Physical Exam Vitals and nursing note reviewed.   79 year old male, resting comfortably and in no acute distress. Vital signs are significant for borderline elevated blood pressure. Oxygen saturation is 95%, which is normal. Head is normocephalic and atraumatic. PERRLA, EOMI. Oropharynx is clear. Neck is nontender and supple without adenopathy or JVD. Back is nontender and there is no CVA tenderness. Lungs are clear without rales, wheezes, or rhonchi. Chest is nontender. Heart has regular rate and rhythm  without murmur. Abdomen is soft, flat, nontender without masses or hepatosplenomegaly and peristalsis is normoactive. Extremities have no cyanosis or edema, full range of motion is present. Skin is warm and dry without rash. Neurologic: Mental status is normal, cranial nerves are intact, there are no motor or sensory deficits.  ED Results / Procedures / Treatments   Labs (all labs ordered are listed, but only abnormal results are displayed) Labs Reviewed  BASIC METABOLIC PANEL - Abnormal; Notable for the following components:      Result Value   Glucose, Bld 168 (*)    All other components within normal limits  CBC - Abnormal; Notable for the following components:   RDW 16.8 (*)    All other components within normal limits  TROPONIN I (HIGH SENSITIVITY)  TROPONIN I (HIGH SENSITIVITY)    EKG EKG Interpretation  Date/Time:  Monday December 18 2019 15:25:27 EDT Ventricular Rate:  69 PR Interval:  216 QRS Duration: 102 QT Interval:  382 QTC Calculation: 409 R Axis:   76 Text Interpretation: Sinus rhythm with 1st degree A-V block with Premature atrial complexes Incomplete right bundle branch block Possible Anterior infarct , age undetermined Abnormal ECG When compared with ECG of 10/12/2012, No significant change was found Confirmed by Delora Fuel ([id number]) on 12/19/2019 12:37:34 AM   Radiology DG Chest 2 View  Result Date: 12/18/2019 CLINICAL DATA:  Chest pain EXAM: CHEST - 2 VIEW COMPARISON:  April 25, 2018 FINDINGS: The heart size and mediastinal contours are within normal limits. Aortic knob calcifications are seen. Subsegmental atelectasis or scarring is seen at the left lung base. The visualized skeletal structures are unremarkable. IMPRESSION: No active cardiopulmonary disease. Electronically Signed   By: Prudencio Pair M.D.   On: 12/18/2019 16:50    Procedures Procedures  Medications Ordered in ED Medications  sodium chloride flush (NS) 0.9 % injection 3 mL (has no  administration in time range)  ED Course  I have reviewed the triage vital signs and the nursing notes.  Pertinent labs & imaging results that were available during my care of the patient were reviewed by me and considered in my medical decision making (see chart for details).  MDM Rules/Calculators/A&P Chest pain of uncertain cause.  He has no significant risk factors for heart disease.  Does have a history of tobacco use in the past, but quit 30 years ago.  ECG is unchanged from prior.  Troponin is normal x2.  Glucose is mildly elevated and will need to be followed as an outpatient.  Old records are reviewed, and he has had mildly elevated glucose in the past.  He is felt to be safe for discharge, may need outpatient stress testing.  He is referred to cardiology for further evaluation.  He is already taking daily aspirin, advised to continue.  Advised to return immediately should he have recurrent chest pain.  Given a prescription for pantoprazole for his indigestion.  Final Clinical Impression(s) / ED Diagnoses Final diagnoses:  Nonspecific chest pain  Indigestion  Elevated random blood glucose level    Rx / DC Orders ED Discharge Orders         Ordered    pantoprazole (PROTONIX) 40 MG tablet  Daily     Discontinue  Reprint     12/19/19 0814           Delora Fuel, MD 48/18/56 [phone]

## 2020-01-04 ENCOUNTER — Other Ambulatory Visit: Payer: Self-pay

## 2020-01-04 ENCOUNTER — Encounter: Payer: Self-pay | Admitting: Cardiovascular Disease

## 2020-01-04 ENCOUNTER — Ambulatory Visit: Payer: Medicare HMO | Admitting: Cardiovascular Disease

## 2020-01-04 VITALS — BP 124/72 | HR 91 | Ht 73.0 in | Wt 182.0 lb

## 2020-01-04 DIAGNOSIS — R0600 Dyspnea, unspecified: Secondary | ICD-10-CM

## 2020-01-04 DIAGNOSIS — R0602 Shortness of breath: Secondary | ICD-10-CM

## 2020-01-04 DIAGNOSIS — E7800 Pure hypercholesterolemia, unspecified: Secondary | ICD-10-CM | POA: Diagnosis not present

## 2020-01-04 DIAGNOSIS — I2510 Atherosclerotic heart disease of native coronary artery without angina pectoris: Secondary | ICD-10-CM | POA: Diagnosis not present

## 2020-01-04 DIAGNOSIS — R0609 Other forms of dyspnea: Secondary | ICD-10-CM

## 2020-01-04 DIAGNOSIS — R0789 Other chest pain: Secondary | ICD-10-CM | POA: Diagnosis not present

## 2020-01-04 DIAGNOSIS — Z01812 Encounter for preprocedural laboratory examination: Secondary | ICD-10-CM | POA: Diagnosis not present

## 2020-01-04 HISTORY — DX: Atherosclerotic heart disease of native coronary artery without angina pectoris: I25.10

## 2020-01-04 HISTORY — DX: Shortness of breath: R06.02

## 2020-01-04 HISTORY — DX: Other chest pain: R07.89

## 2020-01-04 HISTORY — DX: Pure hypercholesterolemia, unspecified: E78.00

## 2020-01-04 MED ORDER — METOPROLOL TARTRATE 50 MG PO TABS
ORAL_TABLET | ORAL | 0 refills | Status: DC
Start: 2020-01-04 — End: 2020-06-01

## 2020-01-04 NOTE — Patient Instructions (Addendum)
Medication Instructions:  METOPROLOL 2 HOURS PRIOR TO CT  *If you need a refill on your cardiac medications before your next appointment, please call your pharmacy*  Lab Work: BMET 1 WEEK PRIOR TO CT   If you have labs (blood work) drawn today and your tests are completely normal, you will receive your results only by: [name] MyChart Message (if you have MyChart) OR . A paper copy in the mail If you have any lab test that is abnormal or we need to change your treatment, we will call you to review the results.  Testing/Procedures: Your physician has requested that you have cardiac CT. Cardiac computed tomography (CT) is a painless test that uses an x-ray machine to take clear, detailed pictures of your heart. For further information please visit HugeFiesta.tn. Please follow instruction sheet as given. THE OFFICE WILL CALL YOU TO SCHEDULE, IF YOU DO NOT HEAR IN 2 WEEKS CALL [phone] TO FOLLOW UP   Follow-Up: At [facility], you and your health needs are our priority.  As part of our continuing mission to provide you with exceptional heart care, we have created designated Provider Care Teams.  These Care Teams include your primary Cardiologist (physician) and Advanced Practice Providers (APPs -  Physician Assistants and Nurse Practitioners) who all work together to provide you with the care you need, when you need it.  We recommend signing up for the patient portal called "MyChart".  Sign up information is provided on this After Visit Summary.  MyChart is used to connect with patients for Virtual Visits (Telemedicine).  Patients are able to view lab/test results, encounter notes, upcoming appointments, etc.  Non-urgent messages can be sent to your provider as well.   To learn more about what you can do with MyChart, go to [email or web address].    Your next appointment:   12 month(s)  You will receive a reminder letter in the mail two months in advance. If you don't receive a letter,  please call our office to schedule the follow-up appointment.  The format for your next appointment:   In Person  Provider:   You may see DR [facility] or one of the following Advanced Practice Providers on your designated Care Team:    Kerin Ransom, PA-C  [place], [place]  Coletta Memos, Ransom  Other Instructions  Your cardiac CT will be scheduled at one of the below locations:   [facility] [address] [place], [place] [address] [phone]  [place] [address] [place],  [address] [phone]  If scheduled at [facility], please arrive at the [facility] main entrance of [facility] 30 minutes prior to test start time. Proceed to the [facility] Radiology Department (first floor) to check-in and test prep.  If scheduled at [facility], please arrive 15 mins early for check-in and test prep.  Please follow these instructions carefully (unless otherwise directed):  Hold all erectile dysfunction medications at least 3 days (72 hrs) prior to test.  On the Night Before the Test: . Be sure to Drink plenty of water. . Do not consume any caffeinated/decaffeinated beverages or chocolate 12 hours prior to your test. . Do not take any antihistamines 12 hours prior to your test. . If the patient has contrast allergy: ? Patient will need a prescription for Prednisone and very clear instructions (as follows): 1. Prednisone 50 mg - take 13 hours prior to test 2. Take  another Prednisone 50 mg 7 hours prior to test 3. Take another Prednisone 50 mg 1 hour prior to test 4. Take Benadryl 50 mg 1 hour prior to test . Patient must complete all four doses of above prophylactic medications. . Patient will need a ride after test due to Benadryl.  On the Day of the Test: . Drink plenty of water. Do not drink any water within one hour of the test. . Do not eat any  food 4 hours prior to the test. . You may take your regular medications prior to the test.  . Take metoprolol (Lopressor) two hours prior to test. . HOLD Furosemide/Hydrochlorothiazide morning of the test. . FEMALES- please wear underwire-free bra if available      After the Test: . Drink plenty of water. . After receiving IV contrast, you may experience a mild flushed feeling. This is normal. . On occasion, you may experience a mild rash up to 24 hours after the test. This is not dangerous. If this occurs, you can take Benadryl 25 mg and increase your fluid intake. . If you experience trouble breathing, this can be serious. If it is severe call 911 IMMEDIATELY. If it is mild, please call our office. . If you take any of these medications: Glipizide/Metformin, Avandament, Glucavance, please do not take 48 hours after completing test unless otherwise instructed.   Once we have confirmed authorization from your insurance company, we will call you to set up a date and time for your test. Based on how quickly your insurance processes prior authorizations requests, please allow up to 4 weeks to be contacted for scheduling your Cardiac CT appointment. Be advised that routine Cardiac CT appointments could be scheduled as many as 8 weeks after your provider has ordered it.  For non-scheduling related questions, please contact the cardiac imaging nurse navigator should you have any questions/concerns: Marchia Bond, Cardiac Imaging Nurse Navigator Burley Saver, Interim Cardiac Imaging Nurse [facility] Direct Office Dial: [phone]   For scheduling needs, including cancellations and rescheduling, please call Vivien Rota at [phone], option 3.     Cardiac CT Angiogram A cardiac CT angiogram is a procedure to look at the heart and the area around the heart. It may be done to help find the cause of chest pains or other symptoms of heart disease. During this procedure,  a substance called contrast dye is injected into the blood vessels in the area to be checked. A large X-ray machine, called a CT scanner, then takes detailed pictures of the heart and the surrounding area. The procedure is also sometimes called a coronary CT angiogram, coronary artery scanning, or CTA. A cardiac CT angiogram allows the health care provider to see how well blood is flowing to and from the heart. The health care provider will be able to see if there are any problems, such as:  Blockage or narrowing of the coronary arteries in the heart.  Fluid around the heart.  Signs of weakness or disease in the muscles, valves, and tissues of the heart. Tell a health care provider about:  Any allergies you have. This is especially important if you have had a previous allergic reaction to contrast dye.  All medicines you are taking, including vitamins, herbs, eye drops, creams, and over-the-counter medicines.  Any blood disorders you have.  Any surgeries you have had.  Any medical conditions you have.  Whether you are pregnant or may be pregnant.  Any anxiety disorders, chronic  pain, or other conditions you have that may increase your stress or prevent you from lying still. What are the risks? Generally, this is a safe procedure. However, problems may occur, including:  Bleeding.  Infection.  Allergic reactions to medicines or dyes.  Damage to other structures or organs.  Kidney damage from the contrast dye that is used.  Increased risk of cancer from radiation exposure. This risk is low. Talk with your health care provider about: ? The risks and benefits of testing. ? How you can receive the lowest dose of radiation. What happens before the procedure?  Wear comfortable clothing and remove any jewelry, glasses, dentures, and hearing aids.  Follow instructions from your health care provider about eating and drinking. This may include: ? For 12 hours before the procedure --  avoid caffeine. This includes tea, coffee, soda, energy drinks, and diet pills. Drink plenty of water or other fluids that do not have caffeine in them. Being well hydrated can prevent complications. ? For 4-6 hours before the procedure -- stop eating and drinking. The contrast dye can cause nausea, but this is less likely if your stomach is empty.  Ask your health care provider about changing or stopping your regular medicines. This is especially important if you are taking diabetes medicines, blood thinners, or medicines to treat problems with erections (erectile dysfunction). What happens during the procedure?   Hair on your chest may need to be removed so that small sticky patches called electrodes can be placed on your chest. These will transmit information that helps to monitor your heart during the procedure.  An IV will be inserted into one of your veins.  You might be given a medicine to control your heart rate during the procedure. This will help to ensure that good images are obtained.  You will be asked to lie on an exam table. This table will slide in and out of the CT machine during the procedure.  Contrast dye will be injected into the IV. You might feel warm, or you may get a metallic taste in your mouth.  You will be given a medicine called nitroglycerin. This will relax or dilate the arteries in your heart.  The table that you are lying on will move into the CT machine tunnel for the scan.  The person running the machine will give you instructions while the scans are being done. You may be asked to: ? Keep your arms above your head. ? Hold your breath. ? Stay very still, even if the table is moving.  When the scanning is complete, you will be moved out of the machine.  The IV will be removed. The procedure may vary among health care providers and hospitals. What can I expect after the procedure? After your procedure, it is common to have:  A metallic taste in your  mouth from the contrast dye.  A feeling of warmth.  A headache from the nitroglycerin. Follow these instructions at home:  Take over-the-counter and prescription medicines only as told by your health care provider.  If you are told, drink enough fluid to keep your urine pale yellow. This will help to flush the contrast dye out of your body.  Most people can return to their normal activities right after the procedure. Ask your health care provider what activities are safe for you.  It is up to you to get the results of your procedure. Ask your health care provider, or the department that is doing the procedure,  when your results will be ready.  Keep all follow-up visits as told by your health care provider. This is important. Contact a health care provider if:  You have any symptoms of allergy to the contrast dye. These include: ? Shortness of breath. ? Rash or hives. ? A racing heartbeat. Summary  A cardiac CT angiogram is a procedure to look at the heart and the area around the heart. It may be done to help find the cause of chest pains or other symptoms of heart disease.  During this procedure, a large X-ray machine, called a CT scanner, takes detailed pictures of the heart and the surrounding area after a contrast dye has been injected into blood vessels in the area.  Ask your health care provider about changing or stopping your regular medicines before the procedure. This is especially important if you are taking diabetes medicines, blood thinners, or medicines to treat erectile dysfunction.  If you are told, drink enough fluid to keep your urine pale yellow. This will help to flush the contrast dye out of your body. This information is not intended to replace advice given to you by your health care provider. Make sure you discuss any questions you have with your health care provider. Document Revised: 01/18/2019 Document Reviewed: 01/18/2019 Elsevier Patient Education  [place].

## 2020-01-04 NOTE — Progress Notes (Signed)
Cardiology Office Note   Date:  01/04/2020   ID:  Jet, Armbrust 1940-11-21, MRN [id number]  PCP:  [name], MD  Cardiologist:   Skeet Latch, MD   No chief complaint on file.    History of Present Illness: Jerome Mays is a 79 y.o. 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  Jerome Mays was seen in the ED 12/2019 with chest pain.  It began when sitting in a chair.  High-sensitivity troponin was negative.  EKG was unremarkable.  He has been experiencing some gas and belching since being diagnosed with ALS.  On the day of presentation to the ED the symptoms became worse.  He tried taking acid reflux medicine which did help.  He notes that he does not get much formal exercise but does like to walk and mow his lawn.  Usually feels well with this.  However sometimes he gets short of breath when trying to go up a hill or when he overexerts himself.  This is worse on days when it is very hot up.  He never has exertional chest pain.  He denies lower extremity edema, orthopnea, or PND.  He is going to [facility] to get treatment for his ALS.  He has noted some dysphagia recently.  Jerome Mays had a stress test in 2012 that was positive.  He underwent right and left heart catheterization 06/2010 with Dr. Sallyanne Kuster.  He was found to have no coronary disease on cath.  He had a chest CT without contrast on 07/2019 that revealed atherosclerosis of the aorta and three-vessel coronary artery disease.  He notes that he quit smoking 25 years ago.   Past Medical History:  Diagnosis Date  . ALS (amyotrophic lateral sclerosis) ([place]) 2021  . Anemia    low iron  . Arthritis   . Atypical chest pain 01/04/2020  . Colonic polyp   . Constipation   . COPD (chronic obstructive pulmonary disease) ([place])   . Coronary artery calcification seen on CAT scan 01/04/2020  . GERD (gastroesophageal reflux disease)   . Gout   . History of  kidney stones   . Malignant tumor of kidney ([place])   . Muscle atrophy   . Pain    LOWER BACK AND LEFT HIP - HX OF PREVIOUS LUMBAR AND CERVICAL SURGERY--PT STATES HE HAS HAD NUMBNESS IN BOTH FEET SINCE HIS BACK SURGERY  . Pure hypercholesterolemia 01/04/2020  . Renal calculus   . Retinal detachment   . Shortness of breath 01/04/2020    Past Surgical History:  Procedure Laterality Date  . BACK SURGERY     LUMBAR SURGERY  . CATARACT EXTRACTION Bilateral 2003   Surgeon unknown  . EYE SURGERY     BILATERAL CATARACT EXTRACTIONS  . GIVENS CAPSULE STUDY N/A 10/07/2015   Procedure: GIVENS CAPSULE STUDY;  Surgeon: Carol Ada, MD;  Location: [place];  Service: Endoscopy;  Laterality: N/A;  . HAND SURGERY     left  . KNEE SURGERY     right  . LAMINECTOMY     cervical  . LASER PHOTO ABLATION Left 08/19/2017   Procedure: LASER PHOTO ABLATION;  Surgeon: [name], MD;  Location: [facility];  Service: Ophthalmology;  Laterality: Left;  . MEMBRANE PEEL Left 03/22/2017   Procedure: POSSIBLE MEMBRANE PEEL WITH SILICONE OIL AND PERFLURON;  Surgeon: [name], MD;  Location: [facility];  Service:  Ophthalmology;  Laterality: Left;  . NEPHRECTOMY     PT STATES RIGHT KIDNEY WAS REMOVED  . PAROTIDECTOMY Right 06/13/2018   Procedure: RIGHT TOTAL PAROTIDECTOMY;  Surgeon: Leta Baptist, MD;  Location: [facility];  Service: ENT;  Laterality: Right;  . PARS PLANA VITRECTOMY Left 03/22/2017   Procedure: PARS PLANA VITRECTOMY WITH 25 GAUGE;  Surgeon: [name], MD;  Location: [facility];  Service: Ophthalmology;  Laterality: Left;  . PARS PLANA VITRECTOMY W/ SCLERAL BUCKLE Left 08/19/2017   Procedure: 16 GAUGE PARS PLANA VITRECTOMY WITH SILCONE OIL REMOVAL;  Surgeon: [name], MD;  Location: [facility];  Service: Ophthalmology;  Laterality: Left;  . 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 SALIVARY STONE REMOVAL Right 02/21/2019   Procedure: RIGHT PAROTID FISTULA REPAIR;  Surgeon: Leta Baptist, MD;  Location:  [place];  Service: ENT;  Laterality: Right;  . SHOULDER SURGERY  2020   [facility]  . TOTAL KNEE ARTHROPLASTY Left 10/19/2012   Procedure: LEFT TOTAL KNEE ARTHROPLASTY;  Surgeon: Magnus Sinning, MD;  Location: [facility];  Service: Orthopedics;  Laterality: Left;  [name] VITRECTOMY 25 GAUGE WITH SCLERAL BUCKLE Left 02/17/2017   Procedure: VITRECTOMY 25 GAUGE WITH SCLERAL BUCKLE membrane peel, injection of silicone oil, and endolaser photocoagulation.;  Surgeon: [name], MD;  Location: [facility];  Service: Ophthalmology;  Laterality: Left;     Current Outpatient Medications  Medication Sig Dispense Refill  . allopurinol (ZYLOPRIM) 300 MG tablet Take 300 mg by mouth daily.     [name] aspirin EC 81 MG tablet Take 81 mg by mouth daily.    [name] b complex vitamins tablet Take 1 tablet by mouth daily.    . B Complex-C (B-COMPLEX WITH VITAMIN C) tablet Take 1 tablet by mouth daily.    . cetirizine (ZYRTEC) 10 MG tablet Take 10 mg by mouth daily.    . cholecalciferol (VITAMIN D3) 25 MCG (1000 UT) tablet Take 1,000 Units by mouth daily.    . clobetasol cream (TEMOVATE) 1.32 % Apply 1 application topically 2 (two) times daily.    . Fluticasone-Umeclidin-Vilant (TRELEGY ELLIPTA) 100-62.5-25 MCG/INH AEPB Inhale 1 puff into the lungs daily. 30 each 11  . hydrOXYzine (ATARAX/VISTARIL) 10 MG tablet Take 10 mg by mouth at bedtime.    [name] ketoconazole (NIZORAL) 2 % shampoo Apply 1 application topically 2 (two) times a week.    [name] ketorolac (ACULAR) 0.5 % ophthalmic solution Place 1 drop into the left eye 4 (four) times daily. 5 mL 5  . Multiple Vitamin (ONE-A-DAY MENS PO) Take 1 tablet by mouth daily.    . pantoprazole (PROTONIX) 40 MG tablet Take 1 tablet (40 mg total) by mouth daily. 30 tablet 0  . prednisoLONE acetate (PRED FORTE) 1 % ophthalmic suspension Place 1 drop into the left eye 4 (four) times daily. 10 mL 1  . riluzole (RILUTEK) 50 MG tablet Take 1 tablet (50 mg total) by mouth every 12 (twelve) hours.  60 tablet 11  . metoprolol tartrate (LOPRESSOR) 50 MG tablet TAKE 1 TABLET 2 HOURS PRIOR TO CT 1 tablet 0   No current facility-administered medications for this visit.    Allergies:   Lipitor [atorvastatin]    Social History:  The patient  reports that he quit smoking about 24 years ago. His smoking use included cigarettes and pipe. He has a 80.00 pack-year smoking history. He has never used smokeless tobacco. He reports that he does not drink alcohol and does not use drugs.  Family History:  The patient's family history includes COPD in his brother and sister; Diabetes in his mother; Heart attack in his father.    ROS:  Please see the history of present illness.   Otherwise, review of systems are positive for none.   All other systems are reviewed and negative.    PHYSICAL EXAM: VS:  BP 124/72   Pulse 91   Ht 6\' 1"  (1.854 m)   Wt 182 lb (82.6 kg)   SpO2 90%   BMI 24.01 kg/m  , BMI Body mass index is 24.01 kg/m. GENERAL:  Well appearing HEENT:  Pupils equal round and reactive, fundi not visualized, oral mucosa unremarkable NECK:  No jugular venous distention, waveform within normal limits, carotid upstroke brisk and symmetric, no bruits LUNGS:  Clear to auscultation bilaterally HEART:  RRR.  PMI not displaced or sustained,S1 and S2 within normal limits, no S3, no S4, no clicks, no rubs, no murmurs ABD:  Flat, positive bowel sounds normal in frequency in pitch, no bruits, no rebound, no guarding, no midline pulsatile mass, no hepatomegaly, no splenomegaly EXT:  2 plus pulses throughout, no edema, no cyanosis no clubbing SKIN:  No rashes no nodules NEURO: Diffuse weakness.  Able to walk PSYCH:  Cognitively intact, oriented to person place and time  EKG:  EKG is not ordered today. The ekg ordered 12/18/19 demonstrates sinus rhythm.  Rate 69 bpm.  iRBBB.   LHC/RHC 06/2010: RA 5, RV 29/4, PA 32/11 mean 20, PCWP 6 LV 141/0, LVEDP 16 Cardiac output 3.8 L, cardiac index 2.45  L There is no coronary artery disease.  Recent Labs: 05/15/2019: TSH 5.680 10/09/2019: ALT 39 12/18/2019: BUN 21; Creatinine, Ser 1.10; Hemoglobin 13.0; Platelets 193; Potassium 3.9; Sodium 140    Lipid Panel No results found for: CHOL, TRIG, HDL, CHOLHDL, VLDL, LDLCALC, LDLDIRECT   08/2019:  Sodium 143, potassium 4.5, BUN 22, creatinine 0.9 AST 26, ALT 27 WBC 8.37, hemoglobin 12.5, hematocrit 43, platelets 194 Total cholesterol 147, triglycerides 110, HDL 33, LDL 92 TSH 4.98  Wt Readings from Last 3 Encounters:  01/04/20 182 lb (82.6 kg)  12/18/19 185 lb (83.9 kg)  10/31/19 179 lb 12.8 oz (81.6 kg)      ASSESSMENT AND PLAN:  # Atypical chest pain: # Exertional dyspnea: # Coronary calcification:  # Hyperlipidemia:  Jerome Mays had chest pain that seems to have resolved with treating his gas.  However he also has exertional dyspnea going up inclines.  He has known CAD noted on chest CT 07/2019.  His prior cath in 2012 revealed normal coronaries.  However this was preceded by a false positive stress test.  He is hesitant to have another stress test.  We will get a coronary CT-a to better evaluate for obstructive coronary disease.  Continue aspirin.  LDL is mildly elevated at 92.  He is not on a statin.  He will work on diet and exercise prior to starting 1.  # Dysphagia:   Likely related to ALS.  He will see gastro[facility]ology at [facility].   Current medicines are reviewed at length with the patient today.  The patient does not have concerns regarding medicines.  The following changes have been made:  no change  Labs/ tests ordered today include:   Orders Placed This Encounter  Procedures  . CT CORONARY MORPH W/CTA COR W/SCORE W/CA W/CM &/OR WO/CM  . CT CORONARY FRACTIONAL FLOW RESERVE DATA PREP  . CT CORONARY FRACTIONAL FLOW RESERVE FLUID ANALYSIS  .  Basic metabolic panel     Disposition:   FU with Jerome Stayer C. Oval Linsey, MD, [facility] in 1 year    Signed, Jerome Menendez C. Oval Linsey, MD,  [facility]  01/04/2020 1:35 PM    [facility]

## 2020-01-23 DIAGNOSIS — J988 Other specified respiratory disorders: Secondary | ICD-10-CM | POA: Diagnosis not present

## 2020-01-23 DIAGNOSIS — Z789 Other specified health status: Secondary | ICD-10-CM | POA: Diagnosis not present

## 2020-01-23 DIAGNOSIS — M6281 Muscle weakness (generalized): Secondary | ICD-10-CM | POA: Diagnosis not present

## 2020-01-23 DIAGNOSIS — Z5181 Encounter for therapeutic drug level monitoring: Secondary | ICD-10-CM | POA: Diagnosis not present

## 2020-01-23 DIAGNOSIS — R471 Dysarthria and anarthria: Secondary | ICD-10-CM | POA: Diagnosis not present

## 2020-01-23 DIAGNOSIS — Z79899 Other long term (current) drug therapy: Secondary | ICD-10-CM | POA: Diagnosis not present

## 2020-01-23 DIAGNOSIS — G1221 Amyotrophic lateral sclerosis: Secondary | ICD-10-CM | POA: Diagnosis not present

## 2020-02-05 ENCOUNTER — Telehealth: Payer: Self-pay | Admitting: *Deleted

## 2020-02-05 NOTE — Telephone Encounter (Signed)
Dicussed need for labs with wife, ok per DPR Patient will get labs this week

## 2020-02-05 NOTE — Telephone Encounter (Signed)
-----   Message from Roosvelt Maser sent at 01/25/2020 11:00 AM EDT ----- Regarding: ct heart   Patient scheduled 9/7 @ 10am.  Patient wants nurse to call about labs  Call wife's cell @ [phone]  Thanks, Vivien Rota

## 2020-02-06 DIAGNOSIS — Z01812 Encounter for preprocedural laboratory examination: Secondary | ICD-10-CM | POA: Diagnosis not present

## 2020-02-06 DIAGNOSIS — R0600 Dyspnea, unspecified: Secondary | ICD-10-CM | POA: Diagnosis not present

## 2020-02-08 ENCOUNTER — Telehealth (HOSPITAL_COMMUNITY): Payer: Self-pay | Admitting: Emergency Medicine

## 2020-02-08 LAB — BASIC METABOLIC PANEL
BUN/Creatinine Ratio: 16 (ref 10–24)
BUN: 21 mg/dL (ref 8–27)
CO2: 19 mmol/L — ABNORMAL LOW (ref 20–29)
Calcium: 10.1 mg/dL (ref 8.6–10.2)
Chloride: 105 mmol/L (ref 96–106)
Creatinine, Ser: 1.29 mg/dL — ABNORMAL HIGH (ref 0.76–1.27)
GFR calc Af Amer: 60 mL/min/{1.73_m2} (ref >59)
GFR calc non Af Amer: 52 mL/min/{1.73_m2} — ABNORMAL LOW (ref >59)
Glucose: 122 mg/dL — ABNORMAL HIGH (ref 65–99)
Potassium: 4.4 mmol/L (ref 3.5–5.2)
Sodium: 139 mmol/L (ref 134–144)

## 2020-02-08 LAB — SPECIMEN STATUS REPORT

## 2020-02-08 NOTE — Telephone Encounter (Signed)
Reaching out to patient to offer assistance regarding upcoming cardiac imaging study; pt verbalizes understanding of appt date/time, parking situation and where to check in, pre-test NPO status and medications ordered, and verified current allergies; name and call back number provided for further questions should they arise Marchia Bond RN Navigator Cardiac Imaging [place] [facility] [phone] office [phone] cell   Requested he have his BMP drawn however he stated he had it drawn yesterday 9/1 at the "heartcare office"

## 2020-02-08 NOTE — Telephone Encounter (Signed)
Pt wife calling about medications her husband is to take prior to the CT appt on 9/7. I advised that metoprolol should be taken 2 hr prior to the appt and she asked about benadryl. I clarified that benadryl is only for those with known allergies to contrast media or if he develops a mild reaction to contrast after the test os complete.   She added that the patient started an experimental herbal suppliement for his ALS per his [facility] doctor theracurmin BID    Marchia Bond RN Navigator Cardiac [facility] [phone] Office  [phone] Cell

## 2020-02-13 ENCOUNTER — Telehealth: Payer: Self-pay | Admitting: Cardiovascular Disease

## 2020-02-13 ENCOUNTER — Encounter: Payer: Self-pay | Admitting: Pulmonary Disease

## 2020-02-13 ENCOUNTER — Other Ambulatory Visit: Payer: Self-pay

## 2020-02-13 ENCOUNTER — Ambulatory Visit: Payer: Medicare HMO | Admitting: Pulmonary Disease

## 2020-02-13 ENCOUNTER — Telehealth (HOSPITAL_COMMUNITY): Payer: Self-pay | Admitting: Emergency Medicine

## 2020-02-13 ENCOUNTER — Ambulatory Visit (INDEPENDENT_AMBULATORY_CARE_PROVIDER_SITE_OTHER): Payer: Medicare HMO

## 2020-02-13 ENCOUNTER — Ambulatory Visit (HOSPITAL_COMMUNITY)
Admission: RE | Admit: 2020-02-13 | Discharge: 2020-02-13 | Disposition: A | Discharge status: Home / Self Care | Payer: Medicare HMO | Source: Ambulatory Visit | Attending: Cardiovascular Disease | Admitting: Cardiovascular Disease

## 2020-02-13 VITALS — BP 102/72 | HR 60 | Temp 97.3°F | Ht 70.0 in | Wt 182.0 lb

## 2020-02-13 DIAGNOSIS — I700 Atherosclerosis of aorta: Secondary | ICD-10-CM | POA: Diagnosis not present

## 2020-02-13 DIAGNOSIS — M6250 Muscle wasting and atrophy, not elsewhere classified, unspecified site: Secondary | ICD-10-CM

## 2020-02-13 DIAGNOSIS — R0600 Dyspnea, unspecified: Secondary | ICD-10-CM | POA: Diagnosis present

## 2020-02-13 DIAGNOSIS — R0609 Other forms of dyspnea: Secondary | ICD-10-CM

## 2020-02-13 DIAGNOSIS — J439 Emphysema, unspecified: Secondary | ICD-10-CM | POA: Insufficient documentation

## 2020-02-13 DIAGNOSIS — G1221 Amyotrophic lateral sclerosis: Secondary | ICD-10-CM | POA: Diagnosis not present

## 2020-02-13 DIAGNOSIS — R0602 Shortness of breath: Secondary | ICD-10-CM

## 2020-02-13 DIAGNOSIS — I2510 Atherosclerotic heart disease of native coronary artery without angina pectoris: Secondary | ICD-10-CM | POA: Diagnosis not present

## 2020-02-13 DIAGNOSIS — J449 Chronic obstructive pulmonary disease, unspecified: Secondary | ICD-10-CM

## 2020-02-13 DIAGNOSIS — R0789 Other chest pain: Secondary | ICD-10-CM | POA: Diagnosis not present

## 2020-02-13 LAB — POCT I-STAT CREATININE: Creatinine, Ser: 1.1 mg/dL (ref 0.61–1.24)

## 2020-02-13 MED ORDER — PREDNISONE 10 MG PO TABS: ORAL_TABLET | ORAL | 0 refills | Status: DC

## 2020-02-13 MED ORDER — SODIUM CHLORIDE 0.9 % IV BOLUS
500.0000 mL | Freq: Once | INTRAVENOUS | Status: AC
  Administered 2020-02-13: 500 mL via INTRAVENOUS

## 2020-02-13 MED ORDER — NITROGLYCERIN 0.4 MG SL SUBL
SUBLINGUAL_TABLET | SUBLINGUAL | Status: AC
  Filled 2020-02-13: qty 2

## 2020-02-13 MED ORDER — TRELEGY ELLIPTA 200-62.5-25 MCG/INH IN AEPB
1.0000 | INHALATION_SPRAY | Freq: Every day | RESPIRATORY_TRACT | 0 refills | Status: DC
Start: 2020-02-13 — End: 2020-03-12

## 2020-02-13 MED ORDER — NITROGLYCERIN 0.4 MG SL SUBL
0.8000 mg | SUBLINGUAL_TABLET | Freq: Once | SUBLINGUAL | Status: AC
  Administered 2020-02-13: 0.8 mg via SUBLINGUAL

## 2020-02-13 MED ORDER — IOHEXOL 350 MG/ML SOLN
80.0000 mL | Freq: Once | INTRAVENOUS | Status: AC | PRN
  Administered 2020-02-13: 80 mL via INTRAVENOUS

## 2020-02-13 NOTE — Telephone Encounter (Signed)
Valarie Merino from [facility] called to give a report on patient. Says CT that Dr. Oval Linsey requested came out abnormal and would like to talk with a triage nurse about CT. Please call back at [phone]

## 2020-02-13 NOTE — Telephone Encounter (Signed)
pts wife called over the weekend and left VM. Attempted to call her back with no answer. [place] RN Navigator Cardiac Imaging [facility] [phone] Office  [phone] Cell

## 2020-02-13 NOTE — Assessment & Plan Note (Signed)
Followed by [facility] On experimental trial drug - Theracurmin  Recent pulmonary function testing performed at [facility] did not show significant restrictive lung disease  Plan: We will request records today

## 2020-02-13 NOTE — Telephone Encounter (Addendum)
Discussed with Dr Oval Linsey, patient needs CT chest PE protocol  If worsening shortness of breath go to ED  Left message to call back

## 2020-02-13 NOTE — Progress Notes (Signed)
CT scan completed. Tolerated well. Blood pressure soft. 566ml NS given with blood pressure coming up. Pt drinking ginger ale. States he feels fine. Denies any dizziness and able to walk without assistance. D/C home on wheelchair with wife. Awake and alert. In no distress

## 2020-02-13 NOTE — Patient Instructions (Addendum)
You were seen today by Lauraine Rinne, NP  for:   1. Shortness of breath 2. COPD with chronic bronchitis ([place])  - DG Chest 2 View; Future  Prednisone 10mg  tablet  >>>4 tabs for 2 days, then 3 tabs for 2 days, 2 tabs for 2 days, then 1 tab for 2 days, then stop >>>take with food  >>>take in the morning   Trial of Trelegy Ellipta 200 >>> 1 puff daily in the morning >>>rinse mouth out after use  >>> This inhaler contains 3 medications that help manage her respiratory status, contact our office if you cannot afford this medication or unable to remain on this medication  Note your daily symptoms > remember "red flags" for COPD:   >>>Increase in cough >>>increase in sputum production >>>increase in shortness of breath or activity  intolerance.   If you notice these symptoms, please call the office to be seen.    3. ALS (amyotrophic lateral sclerosis) ([place]) 4. Muscular atrophy, unspecified site  Continue follow-up with [facility] neurology   We recommend today:  Orders Placed This Encounter  Procedures  . DG Chest 2 View    Standing Status:   Future    Standing Expiration Date:   06/14/2020    Order Specific Question:   Reason for Exam (SYMPTOM  OR DIAGNOSIS REQUIRED)    Answer:   copd, doe    Order Specific Question:   Preferred imaging location?    Answer:   Internal    Order Specific Question:   Radiology Contrast Protocol - do NOT remove file path    Answer:   [email or web address]   Orders Placed This Encounter  Procedures  . DG Chest 2 View   Meds ordered this encounter  Medications  . predniSONE (DELTASONE) 10 MG tablet    Sig: 4 tabs for 2 days, then 3 tabs for 2 days, 2 tabs for 2 days, then 1 tab for 2 days, then stop    Dispense:  20 tablet    Refill:  0    Follow Up:    Return in about 4 weeks (around 03/12/2020), or if symptoms worsen or fail to improve, for Follow up with Dr. Vaughan Browner.   Notification of test  results are managed in the following manner: If there are  any recommendations or changes to the  plan of care discussed in office today,  we will contact you and let you know what they are. If you do not hear from [place], then your results are normal and you can view them through your  MyChart account , or a letter will be sent to you. Thank you again for trusting [place] with your care  - Thank you, [facility]    It is flu season:   >>> Best ways to protect herself from the flu: Receive the yearly flu vaccine, practice good hand hygiene washing with soap and also using hand sanitizer when available, eat a nutritious meals, get adequate rest, hydrate appropriately       Please contact the office if your symptoms worsen or you have concerns that you are not improving.   Thank you for choosing [facility] for your healthcare, and for allowing [place] to partner with you on your healthcare journey. I am thankful to be able to provide care to you today.   Wyn Quaker FNP-C

## 2020-02-13 NOTE — Telephone Encounter (Signed)
Called spoke radiology reading room - CT  Imaging result in epic  will notitfy Dr Oval Linsey to review  IMPRESSION: 1. Probable segmental and subsegmental sized pulmonary emboli in the right lower lobe. This could be confirmed with PE protocol chest CT if clinically appropriate.

## 2020-02-13 NOTE — Progress Notes (Signed)
'@Patient'  ID: Jerome Mays, male    DOB: Jan 08, 1941, 79 y.o.   MRN: [id number]  Chief Complaint  Patient presents with  . Follow-up    increase in SOB, COPD    Referring provider: [name], MD  HPI:  79 year old male former smoker followed in our office for COPD  PMH: ALS, hypercholesteremia Smoker/ Smoking History: Former smoker.  1997.  80-pack-year smoking history. Maintenance: Trelegy Ellipta Pt of: Dr. Vaughan Browner  02/13/2020  - Visit   79 year old male former smoker followed in our office for COPD.  Patient reports adherence to Trelegy Ellipta.  Previous CBC with differential on file shows eosinophil count of 300.  Patient presenting to office today as a follow-up for progressive worsening dyspnea in the setting of COPD.  Patient is also followed by [facility] neurology for ALS.  Last office visit was on 01/23/2020 information from that visit is listed below:  01/23/20 - [facility] [place]  Summary / Medical Decision Making / Recommendations: Diagnosis: ALS  [place]aylor is worse since last time, is on Rilutek. Will check AST/ALT today. Is not on Radicava.   Will order evaluation and treatment by physical therapy, occupational therapy, speech therapy, nutrition therapy, respiratory therapy, social work and Nutritional therapist.  Very extensive discussion with patient regarding disease progression, current management, and future management issues.   Consider the equipment our team discussed including a hand held shower and bidet seat. I gave him scripts for these.  For thick mucous, try Mucinex 685m twice daily.  For your worsening shortness of breath, I think this is more likely the result of COPD than ALS (FVC is still 81% predicted). Advised to see his Pulmonologist soon.  The patient and their wife appeared to comprehend issues discussed and asked appropriate questions. Discussed research, advocacy, fundraising and the importance of staying hopeful. We talked about alternative and off  label treatments for ALS. They are considering Theracurmin which is reasonable.  The following American Academy of Neurology Performance Measures were met today: [place] Updated Disease Modifying Pharmacotherapy Discussed Cognitive and Behavioral Impairment Screening Symptomatic Therapy Offered Respiratory Insufficiency Querying and Referral for PFT NIV Treatment for Respiratory Insufficiency Discussed Screening for Dysphagia Weight Loss and Impaired Nutrition Nutritional Support Offered Communication Support Offered End of Life Planning Assistance [place] Querying  Orders:  Orders Placed This Encounter  Procedures  . Bidet Seat  Order Specific Question: Generic Supply Name  Answer: Bidet Seat  . Hand Held SAssociate ProfessorSpecific Question: Generic Supply Name  Answer: HScientist, research (physical sciences) . Hepatic Function Panel (HFP)  Order Specific Question: Release to patient  Answer: Immediate   Follow Up:  Return in about 2 months (around 03/24/2020). Xc: AGeoffery Lyons MD  Total Visit Time = 25 minutes; Counseling Time = 20 minutes   Electronically signed by BAnise Salvo, MD at 01/24/2020 9:33 AM EDT    Patient was requested to follow-up with our office to further evaluate the increased dyspnea.  We will discuss and evaluate this today.  Walk today in office stable.  Patient was able to walk 1 lap at a slow pace with oxygen levels maintaining above 88% without any drops in oxygen level at room air.  Questionaires / Pulmonary Flowsheets:    MMRC: mMRC Dyspnea Scale mMRC Score  02/13/2020 3  10/31/2019 1    Tests:   Imaging CT scan 10/21/09- Moderate centrilobar emphysema. No pulmonary masses, opacities, asbestos disease or consolidation. Stable enlarged. Stable enlarged  mediastinal and bilateral hilar lymph nodes unchanged for past 3.5 years compatible with benign or reactive process.  CT scan 06/19/16-emphysema, no consolidation  or lung opacity. Mediastinal and bilateral hilar lymphadenopathy stable in size. Clustered left upper lobe nodules.  CT scan 07/01/17- stable subcentimeter pulmonary nodules, stable calcified mediastinal, hilar lymphadenopathy.  CT scan 07/25/2019-stable pulmonary nodules, calcified mediastinal, hilar lymphadenopathy  PFTs 02/04/16 FVC 4.05 [9%), FEV1 2.40 (70%), F/F 59, TLC 101%, DLCO 35% Moderate obstructive defect with severe reduction in diffusion capacity.  Labs CBC 05/21/17-WBC 7.2, eosinophils 3.7%, absolute eos count 266 Blood allergy profile 05/21/17-IgE 22, RAST panel is negative.       FENO:  No results found for: NITRICOXIDE  PFT: PFT Results Latest Ref Rng & Units 02/04/2016  FVC-Pre L 4.05  FVC-Predicted Pre % 89  FVC-Post L 4.40  FVC-Predicted Post % 97  Pre FEV1/FVC % % 59  Post FEV1/FCV % % 61  FEV1-Pre L 2.40  FEV1-Predicted Pre % 73  FEV1-Post L 2.68  DLCO uncorrected ml/min/mmHg 12.60  DLCO UNC% % 35  DLCO corrected ml/min/mmHg 13.15  DLCO COR %Predicted % 37  DLVA Predicted % 41  TLC L 7.58  TLC % Predicted % 101  RV % Predicted % 106    WALK:  SIX MIN WALK 02/13/2020 04/25/2018 05/21/2017  Supplimental Oxygen during Test? (L/min) No No No    Imaging: CT CORONARY MORPH W/CTA COR W/SCORE W/CA W/CM &/OR WO/CM  Addendum Date: 02/13/2020   ADDENDUM REPORT: 02/13/2020 12:52 CLINICAL DATA:  82M with COPD, ALS and atypical chest pain. EXAM: Cardiac/Coronary  CT TECHNIQUE: The patient was scanned on a [employer]. FINDINGS: A 120 kV prospective scan was triggered in the descending thoracic aorta at 111 HU's. Axial non-contrast 3 mm slices were carried out through the heart. The data set was analyzed on a dedicated work station and scored using the [place]. Gantry rotation speed was 250 msecs and collimation was .6 mm. No beta blockade and 0.8 mg of sl NTG was given. The 3D data set was reconstructed in 5% intervals of the 67-82 % of the R-R  cycle. Diastolic phases were analyzed on a dedicated work station using MPR, MIP and VRT modes. The patient received 80 cc of contrast. Aorta: Normal size. Ascending aorta 3.6 cm. Mild calcification of the descending aorta. No dissection. Aortic Valve:  Trileaflet.  Mild calcifications. Coronary Arteries:  Normal coronary origin.  Right dominance. RCA is a large dominant artery that gives rise to PDA and PLVB. There is no plaque. Left main is a large artery that gives rise to LAD and LCX arteries. There is minimal (<25%) calcified plaque at the distal LM. LAD is a large vessel that has no plaque. There are four small diagonals without plaque. LCX is a non-dominant artery that gives rise to one large OM1 branch. There is no plaque. Other findings: Normal pulmonary vein drainage into the left atrium. Normal let atrial appendage without a thrombus. Normal size of the pulmonary artery. Mitral annular calcification. Motion artifact. IMPRESSION: 1. Coronary calcium score of 47.6. This was 15th percentile for age and sex matched control. 2. Normal coronary origin with right dominance. 3. Minimal (<25%) calcified plaque in the distal left main. (CAD-RADS 1) 4.  Consider non-cardiac causes of chest pain. Skeet Latch, MD Electronically Signed   By: Skeet Latch   On: 02/13/2020 12:52   Result Date: 02/13/2020 EXAM: OVER-READ INTERPRETATION  CT CHEST The following report is an over-read performed  by radiologist Dr. Vinnie Langton of [facility], [place] on 02/13/2020. This over-read does not include interpretation of cardiac or coronary anatomy or pathology. The coronary calcium score/coronary CTA interpretation by the cardiologist is attached. COMPARISON:  None. FINDINGS: Aortic atherosclerosis. Probable filling defects in segmental and subsegmental sized pulmonary artery branches to the right lower lobe, concerning for pulmonary emboli (slightly suboptimal assessment secondary to contrast bolus). Dependent  areas of subsegmental atelectasis in the lower lobes of the lungs bilaterally. Mild centrilobular and paraseptal emphysema. Within the visualized portions of the thorax there are no suspicious appearing pulmonary nodules or masses, there is no acute consolidative airspace disease, no pleural effusions, no pneumothorax and no lymphadenopathy. Visualized portions of the upper abdomen are unremarkable. There are no aggressive appearing lytic or blastic lesions noted in the visualized portions of the skeleton. IMPRESSION: 1. Probable segmental and subsegmental sized pulmonary emboli in the right lower lobe. This could be confirmed with PE protocol chest CT if clinically appropriate. 2. Aortic Atherosclerosis (ICD10-I70.0). 3.  Emphysema (ICD10-J43.9). These results will be called to the ordering clinician or representative by the Radiologist Assistant, and communication documented in the PACS or [employer]. Electronically Signed: By: Vinnie Langton M.D. On: 02/13/2020 11:24    Lab Results:  CBC    Component Value Date/Time   WBC 8.0 12/18/2019 1549   RBC 4.57 12/18/2019 1549   HGB 13.0 12/18/2019 1549   HGB 13.2 10/09/2019 0859   HCT 42.2 12/18/2019 1549   HCT 42.5 10/09/2019 0859   PLT 193 12/18/2019 1549   PLT 180 05/15/2019 0938   MCV 92.3 12/18/2019 1549   MCV 85 10/09/2019 0859   MCH 28.4 12/18/2019 1549   MCHC 30.8 12/18/2019 1549   RDW 16.8 (H) 12/18/2019 1549   RDW 20.2 (H) 10/09/2019 0859   LYMPHSABS 1.7 10/09/2019 0859   MONOABS 0.7 05/21/2017 0953   EOSABS 0.3 10/09/2019 0859   BASOSABS 0.1 10/09/2019 0859    BMET    Component Value Date/Time   NA 139 02/06/2020 0000   K 4.4 02/06/2020 0000   CL 105 02/06/2020 0000   CO2 19 (L) 02/06/2020 0000   GLUCOSE 122 (H) 02/06/2020 0000   GLUCOSE 168 (H) 12/18/2019 1549   BUN 21 02/06/2020 0000   CREATININE 1.10 02/13/2020 1034   CALCIUM 10.1 02/06/2020 0000   GFRNONAA 52 (L) 02/06/2020 0000   GFRAA 60 02/06/2020 0000     BNP No results found for: BNP  ProBNP No results found for: PROBNP  Specialty Problems      Pulmonary Problems   COPD with chronic bronchitis (HCC)    Gold stage C. COPD with frequent exacerbations  Positive response to Daliresp      Hemoptysis   COPD, group B, by GOLD 2017 classification ([place])   Shortness of breath      Allergies  Allergen Reactions  . Lipitor [Atorvastatin] Rash and Other (See Comments)    Passed out    Immunization History  Administered Date(s) Administered  . Fluad Quad(high Dose 65+) 02/14/2019  . Influenza Split 02/24/2012, 02/22/2013, 01/21/2015  . Influenza Whole 03/14/2009, 04/08/2010, 02/06/2011  . Influenza, High Dose Seasonal PF 02/22/2017, 02/14/2018  . Influenza,inj,Quad PF,6+ Mos 03/08/2014  . Moderna SARS-COVID-2 Vaccination 06/30/2019, 07/31/2019  . Pneumococcal Conjugate-13 01/08/2014  . Pneumococcal Polysaccharide-23 06/09/2007  . Tdap 08/18/2014    Past Medical History:  Diagnosis Date  . ALS (amyotrophic lateral sclerosis) ([place]) 2021  . Anemia    low iron  .  Arthritis   . Atypical chest pain 01/04/2020  . Colonic polyp   . Constipation   . COPD (chronic obstructive pulmonary disease) ([place])   . Coronary artery calcification seen on CAT scan 01/04/2020  . GERD (gastroesophageal reflux disease)   . Gout   . History of kidney stones   . Malignant tumor of kidney ([place])   . Muscle atrophy   . Pain    LOWER BACK AND LEFT HIP - HX OF PREVIOUS LUMBAR AND CERVICAL SURGERY--PT STATES HE HAS HAD NUMBNESS IN BOTH FEET SINCE HIS BACK SURGERY  . Pure hypercholesterolemia 01/04/2020  . Renal calculus   . Retinal detachment   . Shortness of breath 01/04/2020    Tobacco History: Social History   Tobacco Use  Smoking Status Former Smoker  . Packs/day: 2.00  . Years: 40.00  . Pack years: 80.00  . Types: Cigarettes, Pipe  . Quit date: 06/09/1995  . Years since quitting: 24.6  Smokeless Tobacco Never Used   Counseling given:  Yes   Continue to not smoke  Outpatient Encounter Medications as of 02/13/2020  Medication Sig  . allopurinol (ZYLOPRIM) 300 MG tablet Take 300 mg by mouth daily.   [name] aspirin EC 81 MG tablet Take 81 mg by mouth daily.  [name] b complex vitamins tablet Take 1 tablet by mouth daily.  . B Complex-C (B-COMPLEX WITH VITAMIN C) tablet Take 1 tablet by mouth daily.  . cetirizine (ZYRTEC) 10 MG tablet Take 10 mg by mouth daily.  . cholecalciferol (VITAMIN D3) 25 MCG (1000 UT) tablet Take 1,000 Units by mouth daily.  . clobetasol cream (TEMOVATE) 8.09 % Apply 1 application topically 2 (two) times daily.  . Fluticasone-Umeclidin-Vilant (TRELEGY ELLIPTA) 100-62.5-25 MCG/INH AEPB Inhale 1 puff into the lungs daily.  . hydrOXYzine (ATARAX/VISTARIL) 10 MG tablet Take 10 mg by mouth at bedtime.  [name] ketorolac (ACULAR) 0.5 % ophthalmic solution Place 1 drop into the left eye 4 (four) times daily.  . metoprolol tartrate (LOPRESSOR) 50 MG tablet TAKE 1 TABLET 2 HOURS PRIOR TO CT  . Multiple Vitamin (ONE-A-DAY MENS PO) Take 1 tablet by mouth daily.  . pantoprazole (PROTONIX) 40 MG tablet Take 1 tablet (40 mg total) by mouth daily.  . prednisoLONE acetate (PRED FORTE) 1 % ophthalmic suspension Place 1 drop into the left eye 4 (four) times daily.  . riluzole (RILUTEK) 50 MG tablet Take 1 tablet (50 mg total) by mouth every 12 (twelve) hours.  . [DISCONTINUED] ketoconazole (NIZORAL) 2 % shampoo Apply 1 application topically 2 (two) times a week.   . Fluticasone-Umeclidin-Vilant (TRELEGY ELLIPTA) 200-62.5-25 MCG/INH AEPB Inhale 1 puff into the lungs daily.  . predniSONE (DELTASONE) 10 MG tablet 4 tabs for 2 days, then 3 tabs for 2 days, 2 tabs for 2 days, then 1 tab for 2 days, then stop   Facility-Administered Encounter Medications as of 02/13/2020  Medication  . nitroGLYCERIN (NITROSTAT) 0.4 MG SL tablet     Review of Systems  Review of Systems  Constitutional: Positive for fatigue. Negative for activity change,  chills, fever and unexpected weight change.  HENT: Negative for postnasal drip, rhinorrhea, sinus pressure, sinus pain and sore throat.   Eyes: Negative.   Respiratory: Positive for cough and shortness of breath. Negative for wheezing.   Cardiovascular: Negative for chest pain and palpitations.  Gastrointestinal: Negative for constipation, diarrhea, nausea and vomiting.  Endocrine: Negative.   Genitourinary: Negative.   Musculoskeletal: Negative.   Skin: Negative.   Neurological: Negative for  dizziness and headaches.  Psychiatric/Behavioral: Negative.  Negative for dysphoric mood. The patient is not nervous/anxious.   All other systems reviewed and are negative.    Physical Exam  BP 102/72 (BP Location: Left Arm, Cuff Size: Normal)   Pulse 60   Temp (!) 97.3 F (36.3 C) (Oral)   Ht '5\' 10"'  (1.778 m)   Wt 182 lb (82.6 kg)   SpO2 92%   BMI 26.11 kg/m   Wt Readings from Last 5 Encounters:  02/13/20 182 lb (82.6 kg)  01/04/20 182 lb (82.6 kg)  12/18/19 185 lb (83.9 kg)  10/31/19 179 lb 12.8 oz (81.6 kg)  10/09/19 177 lb 8 oz (80.5 kg)    BMI Readings from Last 5 Encounters:  02/13/20 26.11 kg/m  01/04/20 24.01 kg/m  12/18/19 24.41 kg/m  10/31/19 23.72 kg/m  10/09/19 23.42 kg/m     Physical Exam Vitals and nursing note reviewed.  Constitutional:      General: He is not in acute distress.    Appearance: Normal appearance. He is obese.  HENT:     Head: Normocephalic and atraumatic.     Right Ear: Hearing, tympanic membrane, ear canal and external ear normal.     Left Ear: Hearing, tympanic membrane, ear canal and external ear normal.     Nose: Nose normal. No mucosal edema or rhinorrhea.     Right Turbinates: Not enlarged.     Left Turbinates: Not enlarged.     Mouth/Throat:     Mouth: Mucous membranes are dry.     Pharynx: Oropharynx is clear. No oropharyngeal exudate.  Eyes:     Pupils: Pupils are equal, round, and reactive to light.  Cardiovascular:      Rate and Rhythm: Normal rate and regular rhythm.     Pulses: Normal pulses.     Heart sounds: Normal heart sounds. No murmur heard.   Pulmonary:     Effort: Pulmonary effort is normal.     Breath sounds: Normal breath sounds. No decreased breath sounds, wheezing or rales.  Musculoskeletal:     Cervical back: Normal range of motion.     Right lower leg: No edema.     Left lower leg: No edema.     Comments: Bilateral upper extremity weakness, known ALS  Lymphadenopathy:     Cervical: No cervical adenopathy.  Skin:    General: Skin is warm and dry.     Capillary Refill: Capillary refill takes less than 2 seconds.     Findings: No erythema or rash.  Neurological:     General: No focal deficit present.     Mental Status: He is alert and oriented to person, place, and time.     Motor: Weakness present.     Coordination: Coordination normal.     Gait: Gait is intact. Gait normal.  Psychiatric:        Mood and Affect: Mood normal.        Behavior: Behavior normal. Behavior is cooperative.        Thought Content: Thought content normal.        Judgment: Judgment normal.       Assessment & Plan:   COPD with chronic bronchitis Plan: We will request records from [facility] for repeat pulmonary function testing results Chest x-ray today Trial of Trelegy Ellipta 200 4-week follow-up with Dr. Vaughan Browner We will do a course of prednisone taper today is therapeutic trial  ALS (amyotrophic lateral sclerosis) ([place]) Followed by [facility] experimental trial  drug - Theracurmin  Recent pulmonary function testing performed at [facility] did not show significant restrictive lung disease  Plan: We will request records today  Shortness of breath Suspect shortness of breath is multifactorial given progression in ALS Patient with elevated eosinophil count with Trelegy Ellipta will increase to Trelegy Ellipta 200 Walk today in office stable without any oxygen desaturations Will obtain chest x-ray to further  evaluate We will do therapeutic trial of prednisone and have close follow-up in 4 weeks to monitor    Return in about 4 weeks (around 03/12/2020), or if symptoms worsen or fail to improve, for Follow up with Dr. Vaughan Browner.   Lauraine Rinne, NP 02/13/2020   This appointment required 32 minutes of patient care (this includes precharting, chart review, review of results, face-to-face care, etc.).

## 2020-02-13 NOTE — Assessment & Plan Note (Signed)
Plan: We will request records from [facility] for repeat pulmonary function testing results Chest x-ray today Trial of Trelegy Ellipta 200 4-week follow-up with Dr. Vaughan Browner We will do a course of prednisone taper today is therapeutic trial

## 2020-02-13 NOTE — Assessment & Plan Note (Signed)
Suspect shortness of breath is multifactorial given progression in ALS Patient with elevated eosinophil count with Trelegy Ellipta will increase to Trelegy Ellipta 200 Walk today in office stable without any oxygen desaturations Will obtain chest x-ray to further evaluate We will do therapeutic trial of prednisone and have close follow-up in 4 weeks to monitor

## 2020-02-14 DIAGNOSIS — G1221 Amyotrophic lateral sclerosis: Secondary | ICD-10-CM | POA: Diagnosis not present

## 2020-02-14 DIAGNOSIS — E663 Overweight: Secondary | ICD-10-CM | POA: Diagnosis not present

## 2020-02-14 DIAGNOSIS — G1220 Motor neuron disease, unspecified: Secondary | ICD-10-CM | POA: Diagnosis not present

## 2020-02-14 DIAGNOSIS — J449 Chronic obstructive pulmonary disease, unspecified: Secondary | ICD-10-CM | POA: Diagnosis not present

## 2020-02-14 NOTE — Telephone Encounter (Signed)
Patient's wife is returning call. 

## 2020-02-14 NOTE — Telephone Encounter (Signed)
Spoke with pt and pt's wife, Jerome Mays and advised of possible CT finding of PE.  Advised of need for additional testing to rule out Pulmonary Embolism and someone will be calling to schedule testing.  Order for CT with PE protocol already in Epic. Instructed pt to go to ED for any worsening SOB.  Pt states SOB today is about the same as it normally is.  Pt and pt's wife verbalize understanding and agree with current plan.  Message sent to precert and [facility] to schedule pt for testing.

## 2020-02-14 NOTE — Telephone Encounter (Signed)
[facility] calling pt to schedule chest CT with PE protocol ./cy

## 2020-02-16 ENCOUNTER — Ambulatory Visit
Admission: RE | Admit: 2020-02-16 | Discharge: 2020-02-16 | Disposition: A | Discharge status: Home / Self Care | Payer: Medicare HMO | Source: Ambulatory Visit | Attending: Cardiovascular Disease | Admitting: Cardiovascular Disease

## 2020-02-16 DIAGNOSIS — R0602 Shortness of breath: Secondary | ICD-10-CM

## 2020-02-16 DIAGNOSIS — I700 Atherosclerosis of aorta: Secondary | ICD-10-CM | POA: Diagnosis not present

## 2020-02-16 DIAGNOSIS — J439 Emphysema, unspecified: Secondary | ICD-10-CM | POA: Diagnosis not present

## 2020-02-16 DIAGNOSIS — I2510 Atherosclerotic heart disease of native coronary artery without angina pectoris: Secondary | ICD-10-CM | POA: Diagnosis not present

## 2020-02-16 DIAGNOSIS — J8410 Pulmonary fibrosis, unspecified: Secondary | ICD-10-CM | POA: Diagnosis not present

## 2020-02-16 MED ORDER — IOPAMIDOL (ISOVUE-370) INJECTION 76%
75.0000 mL | Freq: Once | INTRAVENOUS | Status: AC | PRN
  Administered 2020-02-16: 75 mL via INTRAVENOUS

## 2020-02-19 ENCOUNTER — Telehealth: Payer: Self-pay | Admitting: Cardiovascular Disease

## 2020-02-19 NOTE — Telephone Encounter (Signed)
New message:     Patient calling to some results. Please call patient.

## 2020-02-19 NOTE — Telephone Encounter (Signed)
The patient was calling for the results of the CT. Message sent to provider.

## 2020-02-19 NOTE — Telephone Encounter (Signed)
Reviewed Cardiac CT and CT chest PE protocol and advised no blood clot but waiting on Dr Oval Linsey to review

## 2020-02-21 NOTE — Telephone Encounter (Signed)
Patient aware of results.

## 2020-02-27 ENCOUNTER — Encounter (INDEPENDENT_AMBULATORY_CARE_PROVIDER_SITE_OTHER): Payer: Medicare HMO | Admitting: Ophthalmology

## 2020-03-01 ENCOUNTER — Ambulatory Visit: Payer: Medicare HMO | Admitting: Pulmonary Disease

## 2020-03-07 ENCOUNTER — Other Ambulatory Visit (INDEPENDENT_AMBULATORY_CARE_PROVIDER_SITE_OTHER): Payer: Self-pay | Admitting: Ophthalmology

## 2020-03-12 ENCOUNTER — Other Ambulatory Visit: Payer: Self-pay

## 2020-03-12 ENCOUNTER — Ambulatory Visit (INDEPENDENT_AMBULATORY_CARE_PROVIDER_SITE_OTHER): Payer: Medicare HMO | Admitting: Pulmonary Disease

## 2020-03-12 ENCOUNTER — Encounter: Payer: Self-pay | Admitting: Pulmonary Disease

## 2020-03-12 VITALS — BP 120/76 | HR 83 | Temp 98.0°F | Ht 73.0 in | Wt 181.6 lb

## 2020-03-12 DIAGNOSIS — R918 Other nonspecific abnormal finding of lung field: Secondary | ICD-10-CM

## 2020-03-12 DIAGNOSIS — Z23 Encounter for immunization: Secondary | ICD-10-CM | POA: Diagnosis not present

## 2020-03-12 DIAGNOSIS — G1221 Amyotrophic lateral sclerosis: Secondary | ICD-10-CM

## 2020-03-12 DIAGNOSIS — J449 Chronic obstructive pulmonary disease, unspecified: Secondary | ICD-10-CM

## 2020-03-12 MED ORDER — TRELEGY ELLIPTA 200-62.5-25 MCG/INH IN AEPB: 1.0000 | INHALATION_SPRAY | Freq: Every day | RESPIRATORY_TRACT | 2 refills | Status: DC

## 2020-03-12 NOTE — Progress Notes (Signed)
. [facility] Note  03/14/2020     CHIEF COMPLAINT Patient presents for Retina Follow Up   HISTORY OF PRESENT ILLNESS: Jerome Mays is a 79 y.o. male who presents to the clinic today for:   HPI    Retina Follow Up    Patient presents with  Other.  In left eye.  This started 3 months ago.  I, the attending physician,  performed the HPI with the patient and updated documentation appropriately.          Comments    Patient here for 3 months retina follow up for CME OS. Patient states vision good OD. OS no better.  No eye pain.        Last edited by [name], MD on 03/14/2020  2:58 PM. 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 [name] for RD OS   CURRENT MEDICATIONS: Current Outpatient Medications (Ophthalmic Drugs)  Medication Sig  . ketorolac (ACULAR) 0.5 % ophthalmic solution Place 1 drop into the left eye 4 (four) times daily.  . prednisoLONE acetate (PRED FORTE) 1 % ophthalmic suspension Place 1 drop into the left eye 4 (four) times daily.  . sodium chloride (MURO 128) 5 % ophthalmic solution Place 1 drop into the left eye in the morning, at noon, in the evening, and at bedtime.   No current facility-administered medications for this visit. (Ophthalmic Drugs)   Current Outpatient Medications (Other)  Medication Sig  . allopurinol (ZYLOPRIM) 300 MG tablet Take 300 mg by mouth daily.   [name] aspirin EC 81 MG tablet Take 81 mg by mouth daily.  [name] b complex vitamins tablet Take 1 tablet by mouth daily.  . B Complex-C (B-COMPLEX WITH VITAMIN C) tablet Take 1 tablet by mouth daily.  . cetirizine (ZYRTEC) 10 MG  tablet Take 10 mg by mouth daily.  . cholecalciferol (VITAMIN D3) 25 MCG (1000 UT) tablet Take 1,000 Units by mouth daily.  . clobetasol cream (TEMOVATE) 9.62 % Apply 1 application topically 2 (two) times daily.  . Fluticasone-Umeclidin-Vilant (TRELEGY ELLIPTA) 200-62.5-25 MCG/INH AEPB Inhale 1 puff into the lungs daily.  . hydrOXYzine (ATARAX/VISTARIL) 10 MG tablet Take 10 mg by mouth at bedtime.  . metoprolol tartrate (LOPRESSOR) 50 MG tablet TAKE 1 TABLET 2 HOURS PRIOR TO CT  . Multiple Vitamin (ONE-A-DAY MENS PO) Take 1 tablet by mouth daily.  . pantoprazole (PROTONIX) 40 MG tablet Take 1 tablet (40 mg total) by mouth daily.  . riluzole (RILUTEK) 50 MG tablet Take 1 tablet (50 mg total) by mouth every 12 (twelve) hours.   No current facility-administered medications for this visit. (Other)      REVIEW OF SYSTEMS: ROS    Positive for: Musculoskeletal, Cardiovascular, Eyes   Negative for: Constitutional, Gastrointestinal, Neurological, Skin, Genitourinary, HENT, Endocrine, Respiratory, Psychiatric, Allergic/Imm, Heme/Lymph   Last edited by Theodore Demark, COA on 03/14/2020  7:54 AM. (History)       ALLERGIES Allergies  Allergen Reactions  . Lipitor [Atorvastatin] Rash and Other (See Comments)    Passed out    PAST MEDICAL HISTORY Past Medical History:  Diagnosis Date  .  ALS (amyotrophic lateral sclerosis) ([facility]) 2021  . Anemia    low iron  . Arthritis   . Atypical chest pain 01/04/2020  . Colonic polyp   . Constipation   . COPD (chronic obstructive pulmonary disease) ([facility])   . Coronary artery calcification seen on CAT scan 01/04/2020  . GERD (gastroesophageal reflux disease)   . Gout   . History of kidney stones   . Malignant tumor of kidney ([facility])   . Muscle atrophy   . Pain    LOWER BACK AND LEFT HIP - HX OF PREVIOUS LUMBAR AND CERVICAL SURGERY--PT STATES HE HAS HAD NUMBNESS IN BOTH FEET SINCE HIS BACK SURGERY  . Pure hypercholesterolemia 01/04/2020  . Renal  calculus   . Retinal detachment   . Shortness of breath 01/04/2020   Past Surgical History:  Procedure Laterality Date  . BACK SURGERY     LUMBAR SURGERY  . CATARACT EXTRACTION Bilateral 2003   Surgeon unknown  . EYE SURGERY     BILATERAL CATARACT EXTRACTIONS  . GIVENS CAPSULE STUDY N/A 10/07/2015   Procedure: GIVENS CAPSULE STUDY;  Surgeon: Carol Ada, MD;  Location: [facility];  Service: Endoscopy;  Laterality: N/A;  . HAND SURGERY     left  . KNEE SURGERY     right  . LAMINECTOMY     cervical  . LASER PHOTO ABLATION Left 08/19/2017   Procedure: LASER PHOTO ABLATION;  Surgeon: [name], MD;  Location: [facility];  Service: Ophthalmology;  Laterality: Left;  . MEMBRANE PEEL Left 03/22/2017   Procedure: POSSIBLE MEMBRANE PEEL WITH SILICONE OIL AND PERFLURON;  Surgeon: [name], MD;  Location: [facility];  Service: Ophthalmology;  Laterality: Left;  . NEPHRECTOMY     PT STATES RIGHT KIDNEY WAS REMOVED  . PAROTIDECTOMY Right 06/13/2018   Procedure: RIGHT TOTAL PAROTIDECTOMY;  Surgeon: Leta Baptist, MD;  Location: [facility];  Service: ENT;  Laterality: Right;  . PARS PLANA VITRECTOMY Left 03/22/2017   Procedure: PARS PLANA VITRECTOMY WITH 25 GAUGE;  Surgeon: [name], MD;  Location: [facility];  Service: Ophthalmology;  Laterality: Left;  . PARS PLANA VITRECTOMY W/ SCLERAL BUCKLE Left 08/19/2017   Procedure: 47 GAUGE PARS PLANA VITRECTOMY WITH SILCONE OIL REMOVAL;  Surgeon: [name], MD;  Location: [facility];  Service: Ophthalmology;  Laterality: Left;  . 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 SALIVARY STONE REMOVAL Right 02/21/2019   Procedure: RIGHT PAROTID FISTULA REPAIR;  Surgeon: Leta Baptist, MD;  Location: [facility];  Service: ENT;  Laterality: Right;  . SHOULDER SURGERY  2020   [facility]  . TOTAL KNEE ARTHROPLASTY Left 10/19/2012   Procedure: LEFT TOTAL KNEE ARTHROPLASTY;  Surgeon: Magnus Sinning, MD;  Location: [facility];  Service: Orthopedics;  Laterality:  Left;  [name] VITRECTOMY 25 GAUGE WITH SCLERAL BUCKLE Left 02/17/2017   Procedure: VITRECTOMY 25 GAUGE WITH SCLERAL BUCKLE membrane peel, injection of silicone oil, and endolaser photocoagulation.;  Surgeon: [name], MD;  Location: [facility];  Service: Ophthalmology;  Laterality: Left;    FAMILY HISTORY Family History  Problem Relation Age of Onset  . Diabetes Mother   . COPD Brother   . COPD Sister   . Heart attack Father   . Amblyopia Neg Hx   . Blindness Neg Hx   . Cataracts Neg Hx   . Glaucoma Neg Hx   . Macular degeneration Neg Hx   . Retinal detachment Neg Hx   . Retinitis pigmentosa Neg Hx  SOCIAL HISTORY Social History   Tobacco Use  . Smoking status: Former Smoker    Packs/day: 2.00    Years: 40.00    Pack years: 80.00    Types: Cigarettes, Pipe    Quit date: 06/09/1995    Years since quitting: 24.7  . Smokeless tobacco: Never Used  Vaping Use  . Vaping Use: Never used  Substance Use Topics  . Alcohol use: No  . Drug use: No         OPHTHALMIC EXAM:  Base Eye Exam    Visual Acuity (Snellen - Linear)      Right Left   Dist cc 20/20 -2 20/80 -2   Correction: Glasses       Tonometry (Tonopen, 7:49 AM)      Right Left   Pressure 16 18       Pupils      Dark Light Shape React APD   Right 3 2 Round Brisk None   Left 5 5 Round NR None       Visual Fields (Counting fingers)      Left Right    Full Full       Extraocular Movement      Right Left    Full, Ortho Full, Ortho       Neuro/Psych    Oriented x3: Yes   Mood/Affect: Normal       Dilation    Right eye:         Slit Lamp and Fundus Exam    External Exam      Right Left   External Normal Periorbital edema improving       Slit Lamp Exam      Right Left   Lids/Lashes Dermatochalasis - upper lid Dermatochalasis - upper lid   Conjunctiva/Sclera White and quiet White and quiet, residual STK ST quad - gone   Cornea Arcus, mild tear film debris, 1+ Punctate epithelial erosions  arcus, tear film debris, 1+ PEE, mild corneal edema and haze, +bullae inferiorly   Anterior Chamber Deep and quiet Deep, quiet   Iris Round and dilated Round and moderately dilated   Lens Three piece Posterior chamber intraocular lens, trace PCO Three piece Posterior chamber intraocular lens in good position, micro condensations on posterior lens surface - stable   Vitreous Vitreous syneresis, PVD Post vitrectomy       Fundus Exam      Right Left   Disc Pink and Sharp, mild Peripapillary atrophy, Compact Sharp rim, mild pallor, temporal Peripapillary atrophy, +cupping   C/D Ratio 0.4 0.6   Macula Flat, good foveal reflex, Retinal pigment epithelial mottling - mild, No heme or edema  flat; Blunted foveal reflex, mild cystic changes temporal macula - slightly increased   Vessels Vascular attenuation, Tortuous Vascular attenuation   Periphery Attached, mild Reticular degeneration; no RT or RD Attached, moderate buckle height; temporal/inf retinectomy from 2-630 with peripheral edge attached with good laser at the edge; trace areas of fibrosis inferiorly and superior temporal -- surrounded by good laser        Refraction    Wearing Rx      Sphere Cylinder Axis Add   Right -0.75 +1.00 151 +2.50   Left -0.50 +1.00 020 +2.50   Type: PAL          IMAGING AND PROCEDURES  Imaging and Procedures for 09/19/17  OCT, Retina - OU - Both Eyes       Right Eye Quality was good. Central Foveal  Thickness: 246. Progression has been stable. Findings include normal foveal contour, no IRF, no SRF (Partial PVD).   Left Eye Quality was good. Central Foveal Thickness: 242. Progression has worsened. Findings include no SRF, epiretinal membrane, intraretinal fluid, abnormal foveal contour (Patchy ORA, interval increase IRF/CME temporal macula).   Notes Images taken, stored on drive  Diagnosis / Impression:  OD: NFP, No IRF, No SRF OS: retina attached with oil out - Patchy ORA; interval increase  IRF/CME temporal macula  Clinical management:  See below  Abbreviations: NFP - Normal foveal profile. CME - cystoid macular edema. PED - pigment epithelial detachment. IRF - intraretinal fluid. SRF - subretinal fluid. EZ - ellipsoid zone. ERM - epiretinal membrane. ORA - outer retinal atrophy. ORT - outer retinal tubulation. SRHM - subretinal hyper-reflective material         Injection into Tenon's Capsule - OS - Left Eye       Time Out 03/14/2020. 8:20 AM. Confirmed correct patient, procedure, site, and patient consented.   Anesthesia Topical anesthesia was used. Anesthetic medications included Lidocaine 2%, Proparacaine 0.5%.   Procedure Preparation included 5% betadine to ocular surface, eyelid speculum. A 27 gauge needle was used.   Injection:  4 mg KENALOG-95m/ml injection 459m  NDC: 075929-2446-28Lot: [id number] date: 07/09/2021   Route: Other, Site: Left Eye  Post-op Post injection exam found visual acuity of at least counting fingers. The patient tolerated the procedure well. There were no complications. The patient received written and verbal post procedure care education. Post injection medications included erythromycin (Polymyxin B sulfate and trimethoprim).   Notes 0.9 cc of Kenalog-40 (36 mg) injected into subtenon's capsule in the superotemporal quadrant. Betadine was applied to Injection area pre and post-injection then rinsed with sterile BSS. 1 drop of polymixin was instilled into the eye. There were no complications. Pt tolerated procedure well.                 ASSESSMENT/PLAN:    ICD-10-CM   1. 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 Cystoid macular edema of left eye  H35.352 Injection into Tenon's Capsule - OS - Left Eye    triamcinolone acetonide (KENALOG-40) injection 4 mg  4. Retinal edema  H35.81 OCT, Retina - OU - Both Eyes  5. Pseudophakia, both eyes  Z96.1   6. Corneal edema of  left eye  H18.20     1-4. 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23.20 to 03.23.21 due to new diagnosis of motor neuron disease/ALS  - retina attached and in good position  - s/p STK OS #1 (07.14.20), #2 (3.23.21), #3 (06.21.21)  - currently on PF QID and Ketorolac TID OS for CME  - OCT today shows mild interval increase in focal IRF/CME temporal macula OS  - BCVA remains stable  at 20/80 OS  - recommend STK OS #4 today, 10.07.21  - pt wishes to proceed with injection  - RBA of procedure discussed, questions answered  - informed consent obtained and signed  - see procedure note  - corneal edema slightly improved-- stay on muro 128 OS  - continue PF QID and ketorolac TID OS  - IOP 18 today -- good off Alphagan P  - f/u 2-3 months for K and CME check  5. Pseudophakia OU  - IOLs in good position OU  - monitor  6. Corneal edema OS -- improved  - persistent post op corneal edema OS as above  - cont Muro 128 BID as above   Ophthalmic Meds Ordered this visit:  Meds ordered this encounter  Medications  . triamcinolone acetonide (KENALOG-40) injection 4 mg       Return for f/u 2-3 months, CME OS, DFE, OCT.  There are no Patient Instructions on file for this visit.   Explained the diagnoses, plan, and follow up with the patient and they expressed understanding.  Patient expressed  understanding of the importance of proper follow up care.  This document serves as a record of services personally performed by Gardiner Sleeper, MD, PhD. It was created on their behalf by Leonie Douglas, an ophthalmic technician. The creation of this record is the provider's dictation and/or activities during the visit.    Electronically signed by: Leonie Douglas COA, 03/14/20  2:59 PM   This document serves as a record of services personally performed by Gardiner Sleeper, MD, PhD. It was created on their behalf by San Jetty. Owens Shark, OA an ophthalmic technician. The creation of this record is the provider's dictation and/or activities during the visit.    Electronically signed by: San Jetty. Owens Shark, [place] 10.07.20221 2:59 PM  Gardiner Sleeper, M.D., Ph.D. Diseases & Surgery of the Retina and Vitreous [facility]  I have reviewed the above documentation for accuracy and completeness, and I agree with the above. Gardiner Sleeper, M.D., Ph.D. 03/14/20 3:00 PM  Abbreviations: M myopia (nearsighted); A astigmatism; H hyperopia (farsighted); P presbyopia; Mrx spectacle prescription;  CTL contact lenses; OD right eye; OS left eye; OU both eyes  XT exotropia; ET esotropia; PEK punctate epithelial keratitis; PEE punctate epithelial erosions; DES dry eye syndrome; MGD meibomian gland dysfunction; ATs artificial tears; PFAT's preservative free artificial tears; [place] nuclear sclerotic cataract; PSC posterior subcapsular cataract; ERM epi-retinal membrane; PVD posterior vitreous detachment; RD retinal detachment; DM diabetes mellitus; DR diabetic retinopathy; NPDR non-proliferative diabetic retinopathy; PDR proliferative diabetic retinopathy; CSME clinically significant macular edema; DME diabetic macular edema; dbh dot blot hemorrhages; CWS cotton wool spot; POAG primary open angle glaucoma; C/D cup-to-disc ratio; HVF humphrey visual field; GVF goldmann visual field; OCT optical coherence tomography;  IOP intraocular pressure; BRVO Branch retinal vein occlusion; CRVO central retinal vein occlusion; CRAO central retinal artery occlusion; BRAO branch retinal artery occlusion; RT retinal tear; SB scleral buckle; PPV pars plana vitrectomy; VH Vitreous hemorrhage; PRP panretinal laser photocoagulation; IVK intravitreal kenalog; VMT vitreomacular traction; MH Macular hole;  NVD neovascularization of the disc; NVE neovascularization elsewhere; AREDS age related eye disease study; ARMD age related macular degeneration; POAG primary open angle glaucoma; EBMD epithelial/anterior basement membrane dystrophy; ACIOL anterior chamber intraocular lens; IOL intraocular lens; PCIOL posterior chamber intraocular lens; Phaco/IOL phacoemulsification with intraocular lens placement; [place] photorefractive keratectomy; LASIK laser assisted in situ keratomileusis; HTN hypertension; DM diabetes mellitus; COPD chronic obstructive pulmonary disease

## 2020-03-12 NOTE — Progress Notes (Deleted)
Jerome Mays    [id number]    [birth year]  Primary Care Physician:Aronson, Delfino Lovett, MD  Referring Physician: [name], MD [address] [place],  [place] [address]  Chief complaint: Follow-up for COPD GOLD B  HPI: Jerome Mays is a 79 Y/O with Gold stage B COPD (CAT score 8, 1 exacerbation). He has 80-pack-year smoking history.  Quit in 1997.  Symptoms of cough, chest tightness, congestion for the past week after he was exposed to pollen and dust while mowing hay. He denies any fevers, chills, purulent sputum. He feels that the Daliresp is affecting his sleep and he is taking zzquil at night to fall asleep. He had an evaluation in January 2018 for minor hemoptysis. He had a CT scan of the chest which did not show any obvious source of hemoptysis.   Taken off Daliresp in 2018 as he cannot afford the cost and due to side effects of insomnia.  Interim History: Breathing is stable on Trelegy inhaler  Since his last visit he has been diagnosed with ALS and follows at [facility].  Currently on riluzole therapy.   Outpatient Encounter Medications as of 03/12/2020  Medication Sig  . allopurinol (ZYLOPRIM) 300 MG tablet Take 300 mg by mouth daily.   [name] aspirin EC 81 MG tablet Take 81 mg by mouth daily.  [name] b complex vitamins tablet Take 1 tablet by mouth daily.  . B Complex-C (B-COMPLEX WITH VITAMIN C) tablet Take 1 tablet by mouth daily.  . cetirizine (ZYRTEC) 10 MG tablet Take 10 mg by mouth daily.  . cholecalciferol (VITAMIN D3) 25 MCG (1000 UT) tablet Take 1,000 Units by mouth daily.  . clobetasol cream (TEMOVATE) 1.91 % Apply 1 application topically 2 (two) times daily.  . Fluticasone-Umeclidin-Vilant (TRELEGY ELLIPTA) 200-62.5-25 MCG/INH AEPB Inhale 1 puff into the lungs daily.  . hydrOXYzine (ATARAX/VISTARIL) 10 MG tablet Take 10 mg by mouth at bedtime.  [name] ketorolac (ACULAR) 0.5 % ophthalmic solution Place 1 drop into the left eye 4 (four) times daily.  . metoprolol  tartrate (LOPRESSOR) 50 MG tablet TAKE 1 TABLET 2 HOURS PRIOR TO CT  . Multiple Vitamin (ONE-A-DAY MENS PO) Take 1 tablet by mouth daily.  . pantoprazole (PROTONIX) 40 MG tablet Take 1 tablet (40 mg total) by mouth daily.  . prednisoLONE acetate (PRED FORTE) 1 % ophthalmic suspension Place 1 drop into the left eye 4 (four) times daily.  . riluzole (RILUTEK) 50 MG tablet Take 1 tablet (50 mg total) by mouth every 12 (twelve) hours.  . sodium chloride (MURO 128) 5 % ophthalmic solution Place 1 drop into the left eye in the morning, at noon, in the evening, and at bedtime.  . [DISCONTINUED] Fluticasone-Umeclidin-Vilant (TRELEGY ELLIPTA) 100-62.5-25 MCG/INH AEPB Inhale 1 puff into the lungs daily.  . [DISCONTINUED] predniSONE (DELTASONE) 10 MG tablet 4 tabs for 2 days, then 3 tabs for 2 days, 2 tabs for 2 days, then 1 tab for 2 days, then stop   No facility-administered encounter medications on file as of 03/12/2020.   Physical Exam: Blood pressure 122/70, pulse 72, temperature 98.2 F (36.8 C), temperature source Oral, height 6\' 1"  (1.854 m), weight 179 lb 12.8 oz (81.6 kg), SpO2 92 %. Gen:      No acute distress HEENT:  EOMI, sclera anicteric Neck:     No masses; no thyromegaly Lungs:    Clear to auscultation bilaterally; normal respiratory effort CV:  Regular rate and rhythm; no murmurs Abd:      + bowel sounds; soft, non-tender; no palpable masses, no distension Ext:    No edema; adequate peripheral perfusion Skin:      Warm and dry; no rash Neuro: alert and oriented x 3 Psych: normal mood and affect  Data Reviewed: Imaging CT scan 10/21/09- Moderate centrilobar emphysema. No pulmonary masses, opacities, asbestos disease or consolidation. Stable enlarged. Stable enlarged mediastinal and bilateral hilar lymph nodes unchanged for past 3.5 years compatible with benign or reactive process.  CT scan 06/19/16-emphysema, no consolidation or lung opacity. Mediastinal and bilateral hilar  lymphadenopathy stable in size. Clustered left upper lobe nodules.  CT scan 07/01/17- stable subcentimeter pulmonary nodules, stable calcified mediastinal, hilar lymphadenopathy.  CT scan 07/25/2019-stable pulmonary nodules, calcified mediastinal, hilar lymphadenopathy I have reviewed the images personally.  PFTs 02/04/16 FVC 4.05 [9%), FEV1 2.40 (70%), F/F 59, TLC 101%, DLCO 35% Moderate obstructive defect with severe reduction in diffusion capacity.  [facility] PFT 01/23/20 FVC Pre L 3.42    FEV1 Pre L 1.72    FEV1/FVC Pre % 50.29     Labs CBC 05/21/17-WBC 7.2, eosinophils 3.7%, absolute eos count 266 Blood allergy profile 05/21/17-IgE 22, RAST panel is negative.     Assessment:  Gold stage B COPD. Symptoms are stable on trelegy inhaler. Continue current therapy.  Minor hemoptysis, in early 2018 Subcm pulm nodules He has no recurrence of hemoptysis and no obvious source on CT scan. He has new tiny left upper lobe nodules and mediastinal LNs which has remained stable on follow up CT. Follow-up in 1 year with repeat scan.  Continued stability is demonstrated then we can stop imaging.  Health maintenance 01/08/2014-Prevnar 13 06/09/2007-Pneumovax  Plan/Recommendations: - Continue trelegy inhaler  Follow-up in 1 year  Marshell Garfinkel MD [facility] 03/12/2020, 9:10 AM  CC: [name], MD

## 2020-03-12 NOTE — Patient Instructions (Signed)
I am glad that your breathing is improved with the new inhaler we will call in a prescription for Trelegy 200  follow-up in 3 months.

## 2020-03-12 NOTE — Progress Notes (Signed)
Jerome Mays    [id number]    10-25-1940  Primary Care Physician:Aronson, Delfino Lovett, MD  Referring Physician: [name], MD [address] 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 He has 80-pack-year smoking history.  Quit in 1997.  Symptoms of cough, chest tightness, congestion for the past week after he was exposed to pollen and dust while mowing hay. He denies any fevers, chills, purulent sputum. He feels that the Daliresp is affecting his sleep and he is taking zzquil at night to fall asleep. He had an evaluation in January 2018 for minor hemoptysis. He had a CT scan of the chest which did not show any obvious source of hemoptysis.   Taken off Daliresp in 2018 as he cannot afford the cost and due to side effects of insomnia. 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 Currently on riluzole therapy.  Interim History: As last visit he was given a prednisone course and Trelegy increased to 200.  He feels that breathing is slightly improved with this but continues to have dyspnea on exertion  Continues follow-up at [facility] for ALS.  He has had regular PFTs there this year with stable FVC  Outpatient Encounter Medications as of 03/12/2020  Medication Sig  . allopurinol (ZYLOPRIM) 300 MG tablet Take 300 mg by mouth daily.   [name] aspirin EC 81 MG tablet Take 81 mg by mouth daily.  [name] b complex vitamins tablet Take 1 tablet by mouth daily.  . B Complex-C (B-COMPLEX WITH VITAMIN C) tablet Take 1 tablet by mouth daily.  . cetirizine (ZYRTEC) 10 MG tablet Take 10 mg by mouth daily.  . cholecalciferol (VITAMIN D3) 25 MCG (1000 UT) tablet Take 1,000 Units by mouth daily.  . clobetasol cream (TEMOVATE) 3.57 % Apply 1 application topically 2 (two) times daily.  . Fluticasone-Umeclidin-Vilant (TRELEGY ELLIPTA) 200-62.5-25 MCG/INH AEPB Inhale 1 puff into the  lungs daily.  . hydrOXYzine (ATARAX/VISTARIL) 10 MG tablet Take 10 mg by mouth at bedtime.  [name] ketorolac (ACULAR) 0.5 % ophthalmic solution Place 1 drop into the left eye 4 (four) times daily.  . metoprolol tartrate (LOPRESSOR) 50 MG tablet TAKE 1 TABLET 2 HOURS PRIOR TO CT  . Multiple Vitamin (ONE-A-DAY MENS PO) Take 1 tablet by mouth daily.  . pantoprazole (PROTONIX) 40 MG tablet Take 1 tablet (40 mg total) by mouth daily.  . prednisoLONE acetate (PRED FORTE) 1 % ophthalmic suspension Place 1 drop into the left eye 4 (four) times daily.  . riluzole (RILUTEK) 50 MG tablet Take 1 tablet (50 mg total) by mouth every 12 (twelve) hours.  . sodium chloride (MURO 128) 5 % ophthalmic solution Place 1 drop into the left eye in the morning, at noon, in the evening, and at bedtime.  . [DISCONTINUED] Fluticasone-Umeclidin-Vilant (TRELEGY ELLIPTA) 100-62.5-25 MCG/INH AEPB Inhale 1 puff into the lungs daily.  . [DISCONTINUED] predniSONE (DELTASONE) 10 MG tablet 4 tabs for 2 days, then 3 tabs for 2 days, 2 tabs for 2 days, then 1 tab for 2 days, then stop   No facility-administered encounter medications on file as of 03/12/2020.   Physical Exam: Blood pressure 122/70, pulse 72, temperature 98.2 F (36.8 C), temperature source Oral, height 6\' 1"  (1.854 m), weight 179 lb 12.8 oz (81.6 kg), SpO2 92 %. Gen:  No acute distress HEENT:  EOMI, sclera anicteric Neck:     No masses; no thyromegaly Lungs:    Clear to auscultation bilaterally; normal respiratory effort CV:         Regular rate and rhythm; no murmurs Abd:      + bowel sounds; soft, non-tender; no palpable masses, no distension Ext:    No edema; adequate peripheral perfusion Skin:      Warm and dry; no rash Neuro: alert and oriented x 3 Psych: normal mood and affect  Data Reviewed: Imaging CT scan 10/21/09- Moderate centrilobar emphysema. No pulmonary masses, opacities, asbestos disease or consolidation. Stable enlarged. Stable enlarged  mediastinal and bilateral hilar lymph nodes unchanged for past 3.5 years compatible with benign or reactive process.  CT scan 06/19/16-emphysema, no consolidation or lung opacity. Mediastinal and bilateral hilar lymphadenopathy stable in size. Clustered left upper lobe nodules.  CT scan 07/01/17- stable subcentimeter pulmonary nodules, stable calcified mediastinal, hilar lymphadenopathy.  CT scan 07/25/2019-stable pulmonary nodules, calcified mediastinal, hilar lymphadenopathy  CTA 02/16/2020-no PE, emphysema, stable pulmonary nodules  PFTs  02/04/16 FVC 4.05 [9%), FEV1 2.40 (70%), F/F 59, TLC 101%, DLCO 35% Moderate obstructive defect with severe reduction in diffusion capacity.  09/12/2019 [[facility]] FVC 3.34 [78%], FEV1 1.89 [60%], F/F 57  01/23/2020 [[facility]] FVC 3.42 [80%], FEV1 1.72 [55%], F/F 50  Labs CBC 05/21/17-WBC 7.2, eosinophils 3.7%, absolute eos count 266 Blood allergy profile 05/21/17-IgE 22, RAST panel is negative.     Assessment:  Gold stage B COPD. Symptoms are stable on higher dose of Trelegy inhaler. Continue current therapy.  ALS Follows at [facility] monitoring spirometry and FVC  Minor hemoptysis, in early 2018 Subcm pulm nodules He has no recurrence of hemoptysis and no obvious source on CT scan. He has nodules and mediastinal LNs which has remained stable on follow up CT.  Health maintenance 01/08/2014-Prevnar 13 06/09/2007-Pneumovax  Plan/Recommendations: - Continue trelegy inhaler  Follow-up in 3 months  Marshell Garfinkel MD [facility] 03/12/2020, 9:07 AM  CC: [name], MD

## 2020-03-13 ENCOUNTER — Encounter: Payer: Self-pay | Admitting: Pulmonary Disease

## 2020-03-14 ENCOUNTER — Other Ambulatory Visit: Payer: Self-pay

## 2020-03-14 ENCOUNTER — Encounter (INDEPENDENT_AMBULATORY_CARE_PROVIDER_SITE_OTHER): Payer: Self-pay | Admitting: Ophthalmology

## 2020-03-14 ENCOUNTER — Ambulatory Visit (INDEPENDENT_AMBULATORY_CARE_PROVIDER_SITE_OTHER): Payer: Medicare HMO | Admitting: Ophthalmology

## 2020-03-14 ENCOUNTER — Encounter (INDEPENDENT_AMBULATORY_CARE_PROVIDER_SITE_OTHER): Payer: Medicare HMO | Admitting: Ophthalmology

## 2020-03-14 DIAGNOSIS — H35352 Cystoid macular degeneration, left eye: Secondary | ICD-10-CM

## 2020-03-14 DIAGNOSIS — H3522 Other non-diabetic proliferative retinopathy, left eye: Secondary | ICD-10-CM

## 2020-03-14 DIAGNOSIS — H3581 Retinal edema: Secondary | ICD-10-CM | POA: Diagnosis not present

## 2020-03-14 DIAGNOSIS — H3322 Serous retinal detachment, left eye: Secondary | ICD-10-CM

## 2020-03-14 DIAGNOSIS — Z961 Presence of intraocular lens: Secondary | ICD-10-CM | POA: Diagnosis not present

## 2020-03-14 DIAGNOSIS — H1820 Unspecified corneal edema: Secondary | ICD-10-CM

## 2020-03-14 MED ORDER — TRIAMCINOLONE ACETONIDE 40 MG/ML IJ SUSP FOR KALEIDOSCOPE
4.0000 mg | INTRAMUSCULAR | Status: AC | PRN
  Administered 2020-03-14: 4 mg

## 2020-03-26 ENCOUNTER — Telehealth: Payer: Self-pay | Admitting: Pulmonary Disease

## 2020-03-26 DIAGNOSIS — R682 Dry mouth, unspecified: Secondary | ICD-10-CM | POA: Diagnosis not present

## 2020-03-26 DIAGNOSIS — Z789 Other specified health status: Secondary | ICD-10-CM | POA: Diagnosis not present

## 2020-03-26 DIAGNOSIS — G1221 Amyotrophic lateral sclerosis: Secondary | ICD-10-CM | POA: Diagnosis not present

## 2020-03-26 DIAGNOSIS — J449 Chronic obstructive pulmonary disease, unspecified: Secondary | ICD-10-CM | POA: Diagnosis not present

## 2020-03-26 DIAGNOSIS — M6281 Muscle weakness (generalized): Secondary | ICD-10-CM | POA: Diagnosis not present

## 2020-03-26 DIAGNOSIS — R6881 Early satiety: Secondary | ICD-10-CM | POA: Diagnosis not present

## 2020-03-26 DIAGNOSIS — J984 Other disorders of lung: Secondary | ICD-10-CM | POA: Diagnosis not present

## 2020-03-26 DIAGNOSIS — G709 Myoneural disorder, unspecified: Secondary | ICD-10-CM | POA: Diagnosis not present

## 2020-03-26 DIAGNOSIS — Z5181 Encounter for therapeutic drug level monitoring: Secondary | ICD-10-CM | POA: Diagnosis not present

## 2020-03-26 NOTE — Telephone Encounter (Signed)
Called and spoke with [name] to let her know that I was going to fax over CXR to Dr. Franchot Mimes now. She verified fax number. Nothing further needed at this time.

## 2020-04-03 ENCOUNTER — Other Ambulatory Visit (INDEPENDENT_AMBULATORY_CARE_PROVIDER_SITE_OTHER): Payer: Self-pay | Admitting: Ophthalmology

## 2020-04-04 DIAGNOSIS — G1221 Amyotrophic lateral sclerosis: Secondary | ICD-10-CM | POA: Diagnosis not present

## 2020-04-05 ENCOUNTER — Other Ambulatory Visit (INDEPENDENT_AMBULATORY_CARE_PROVIDER_SITE_OTHER): Payer: Self-pay

## 2020-04-05 MED ORDER — PREDNISOLONE ACETATE 1 % OP SUSP
1.0000 [drp] | Freq: Four times a day (QID) | OPHTHALMIC | 0 refills | Status: DC
Start: 2020-04-05 — End: 2020-06-26

## 2020-04-16 DIAGNOSIS — L570 Actinic keratosis: Secondary | ICD-10-CM | POA: Diagnosis not present

## 2020-05-05 DIAGNOSIS — G1221 Amyotrophic lateral sclerosis: Secondary | ICD-10-CM | POA: Diagnosis not present

## 2020-05-29 ENCOUNTER — Other Ambulatory Visit: Payer: Self-pay

## 2020-05-29 ENCOUNTER — Emergency Department (HOSPITAL_COMMUNITY): Payer: Medicare HMO

## 2020-05-29 ENCOUNTER — Inpatient Hospital Stay (HOSPITAL_COMMUNITY)
Admission: EM | Admit: 2020-05-29 | Discharge: 2020-06-01 | DRG: 659 | Disposition: A | Discharge status: Home / Self Care | Payer: Medicare HMO | Attending: Internal Medicine | Admitting: Internal Medicine

## 2020-05-29 ENCOUNTER — Encounter (HOSPITAL_COMMUNITY): Payer: Self-pay

## 2020-05-29 DIAGNOSIS — Z8249 Family history of ischemic heart disease and other diseases of the circulatory system: Secondary | ICD-10-CM

## 2020-05-29 DIAGNOSIS — I2699 Other pulmonary embolism without acute cor pulmonale: Secondary | ICD-10-CM | POA: Diagnosis not present

## 2020-05-29 DIAGNOSIS — E785 Hyperlipidemia, unspecified: Secondary | ICD-10-CM | POA: Diagnosis present

## 2020-05-29 DIAGNOSIS — Z9842 Cataract extraction status, left eye: Secondary | ICD-10-CM

## 2020-05-29 DIAGNOSIS — N281 Cyst of kidney, acquired: Secondary | ICD-10-CM

## 2020-05-29 DIAGNOSIS — J449 Chronic obstructive pulmonary disease, unspecified: Secondary | ICD-10-CM | POA: Diagnosis present

## 2020-05-29 DIAGNOSIS — I2694 Multiple subsegmental pulmonary emboli without acute cor pulmonale: Secondary | ICD-10-CM | POA: Diagnosis not present

## 2020-05-29 DIAGNOSIS — Z20822 Contact with and (suspected) exposure to covid-19: Secondary | ICD-10-CM | POA: Diagnosis present

## 2020-05-29 DIAGNOSIS — Z905 Acquired absence of kidney: Secondary | ICD-10-CM

## 2020-05-29 DIAGNOSIS — Z8719 Personal history of other diseases of the digestive system: Secondary | ICD-10-CM

## 2020-05-29 DIAGNOSIS — N132 Hydronephrosis with renal and ureteral calculous obstruction: Secondary | ICD-10-CM

## 2020-05-29 DIAGNOSIS — Z888 Allergy status to other drugs, medicaments and biological substances status: Secondary | ICD-10-CM

## 2020-05-29 DIAGNOSIS — Z96652 Presence of left artificial knee joint: Secondary | ICD-10-CM | POA: Diagnosis present

## 2020-05-29 DIAGNOSIS — R310 Gross hematuria: Secondary | ICD-10-CM | POA: Diagnosis not present

## 2020-05-29 DIAGNOSIS — R1111 Vomiting without nausea: Secondary | ICD-10-CM | POA: Diagnosis not present

## 2020-05-29 DIAGNOSIS — N210 Calculus in bladder: Secondary | ICD-10-CM | POA: Diagnosis present

## 2020-05-29 DIAGNOSIS — J9811 Atelectasis: Secondary | ICD-10-CM | POA: Diagnosis not present

## 2020-05-29 DIAGNOSIS — Z833 Family history of diabetes mellitus: Secondary | ICD-10-CM

## 2020-05-29 DIAGNOSIS — Z87442 Personal history of urinary calculi: Secondary | ICD-10-CM

## 2020-05-29 DIAGNOSIS — J90 Pleural effusion, not elsewhere classified: Secondary | ICD-10-CM | POA: Diagnosis not present

## 2020-05-29 DIAGNOSIS — R109 Unspecified abdominal pain: Secondary | ICD-10-CM | POA: Diagnosis not present

## 2020-05-29 DIAGNOSIS — M109 Gout, unspecified: Secondary | ICD-10-CM | POA: Diagnosis present

## 2020-05-29 DIAGNOSIS — R1084 Generalized abdominal pain: Secondary | ICD-10-CM | POA: Diagnosis not present

## 2020-05-29 DIAGNOSIS — R0602 Shortness of breath: Secondary | ICD-10-CM | POA: Diagnosis not present

## 2020-05-29 DIAGNOSIS — N1330 Unspecified hydronephrosis: Secondary | ICD-10-CM | POA: Diagnosis present

## 2020-05-29 DIAGNOSIS — Z85528 Personal history of other malignant neoplasm of kidney: Secondary | ICD-10-CM

## 2020-05-29 DIAGNOSIS — Z7982 Long term (current) use of aspirin: Secondary | ICD-10-CM

## 2020-05-29 DIAGNOSIS — Z87891 Personal history of nicotine dependence: Secondary | ICD-10-CM

## 2020-05-29 DIAGNOSIS — J439 Emphysema, unspecified: Secondary | ICD-10-CM | POA: Diagnosis not present

## 2020-05-29 DIAGNOSIS — Z79899 Other long term (current) drug therapy: Secondary | ICD-10-CM

## 2020-05-29 DIAGNOSIS — E441 Mild protein-calorie malnutrition: Secondary | ICD-10-CM | POA: Diagnosis present

## 2020-05-29 DIAGNOSIS — R1030 Lower abdominal pain, unspecified: Secondary | ICD-10-CM | POA: Diagnosis not present

## 2020-05-29 DIAGNOSIS — J9601 Acute respiratory failure with hypoxia: Secondary | ICD-10-CM

## 2020-05-29 DIAGNOSIS — Z9841 Cataract extraction status, right eye: Secondary | ICD-10-CM

## 2020-05-29 DIAGNOSIS — K5900 Constipation, unspecified: Secondary | ICD-10-CM | POA: Diagnosis present

## 2020-05-29 DIAGNOSIS — N179 Acute kidney failure, unspecified: Secondary | ICD-10-CM | POA: Diagnosis present

## 2020-05-29 DIAGNOSIS — I2510 Atherosclerotic heart disease of native coronary artery without angina pectoris: Secondary | ICD-10-CM | POA: Diagnosis present

## 2020-05-29 DIAGNOSIS — Z825 Family history of asthma and other chronic lower respiratory diseases: Secondary | ICD-10-CM

## 2020-05-29 DIAGNOSIS — G1221 Amyotrophic lateral sclerosis: Secondary | ICD-10-CM | POA: Diagnosis present

## 2020-05-29 DIAGNOSIS — M1990 Unspecified osteoarthritis, unspecified site: Secondary | ICD-10-CM | POA: Diagnosis present

## 2020-05-29 DIAGNOSIS — D509 Iron deficiency anemia, unspecified: Secondary | ICD-10-CM | POA: Diagnosis present

## 2020-05-29 DIAGNOSIS — I10 Essential (primary) hypertension: Secondary | ICD-10-CM | POA: Diagnosis not present

## 2020-05-29 DIAGNOSIS — E7800 Pure hypercholesterolemia, unspecified: Secondary | ICD-10-CM | POA: Diagnosis present

## 2020-05-29 DIAGNOSIS — K219 Gastro-esophageal reflux disease without esophagitis: Secondary | ICD-10-CM | POA: Diagnosis present

## 2020-05-29 DIAGNOSIS — R0902 Hypoxemia: Secondary | ICD-10-CM | POA: Diagnosis not present

## 2020-05-29 LAB — CBC WITH DIFFERENTIAL/PLATELET
Abs Immature Granulocytes: 0.04 10*3/uL (ref 0.00–0.07)
Basophils Absolute: 0 10*3/uL (ref 0.0–0.1)
Basophils Relative: 0 %
Eosinophils Absolute: 0 10*3/uL (ref 0.0–0.5)
Eosinophils Relative: 0 %
HCT: 40.1 % (ref 39.0–52.0)
Hemoglobin: 12.2 g/dL — ABNORMAL LOW (ref 13.0–17.0)
Immature Granulocytes: 0 %
Lymphocytes Relative: 8 %
Lymphs Abs: 0.8 10*3/uL (ref 0.7–4.0)
MCH: 27.5 pg (ref 26.0–34.0)
MCHC: 30.4 g/dL (ref 30.0–36.0)
MCV: 90.5 fL (ref 80.0–100.0)
Monocytes Absolute: 0.4 10*3/uL (ref 0.1–1.0)
Monocytes Relative: 4 %
Neutro Abs: 8.6 10*3/uL — ABNORMAL HIGH (ref 1.7–7.7)
Neutrophils Relative %: 88 %
Platelets: 202 10*3/uL (ref 150–400)
RBC: 4.43 MIL/uL (ref 4.22–5.81)
RDW: 17.2 % — ABNORMAL HIGH (ref 11.5–15.5)
WBC: 10 10*3/uL (ref 4.0–10.5)
nRBC: 0 % (ref 0.0–0.2)

## 2020-05-29 LAB — RESP PANEL BY RT-PCR (FLU A&B, COVID) ARPGX2
Influenza A by PCR: NEGATIVE
Influenza B by PCR: NEGATIVE
SARS Coronavirus 2 by RT PCR: NEGATIVE

## 2020-05-29 LAB — URINALYSIS, ROUTINE W REFLEX MICROSCOPIC
Bilirubin Urine: NEGATIVE
Glucose, UA: NEGATIVE mg/dL
Ketones, ur: NEGATIVE mg/dL
Nitrite: NEGATIVE
Specific Gravity, Urine: 1.015 (ref 1.005–1.030)
pH: 7 (ref 5.0–8.0)

## 2020-05-29 LAB — URINALYSIS, MICROSCOPIC (REFLEX): RBC / HPF: >50 RBC/hpf (ref 0–5)

## 2020-05-29 LAB — COMPREHENSIVE METABOLIC PANEL
ALT: 25 U/L (ref 0–44)
AST: 29 U/L (ref 15–41)
Albumin: 3.4 g/dL — ABNORMAL LOW (ref 3.5–5.0)
Alkaline Phosphatase: 67 U/L (ref 38–126)
Anion gap: 10 (ref 5–15)
BUN: 23 mg/dL (ref 8–23)
CO2: 24 mmol/L (ref 22–32)
Calcium: 9.8 mg/dL (ref 8.9–10.3)
Chloride: 105 mmol/L (ref 98–111)
Creatinine, Ser: 1.15 mg/dL (ref 0.61–1.24)
GFR, Estimated: >60 mL/min (ref >60)
Glucose, Bld: 122 mg/dL — ABNORMAL HIGH (ref 70–99)
Potassium: 4.4 mmol/L (ref 3.5–5.1)
Sodium: 139 mmol/L (ref 135–145)
Total Bilirubin: 0.7 mg/dL (ref 0.3–1.2)
Total Protein: 6.6 g/dL (ref 6.5–8.1)

## 2020-05-29 LAB — LIPASE, BLOOD: Lipase: 17 U/L (ref 11–51)

## 2020-05-29 LAB — D-DIMER, QUANTITATIVE: D-Dimer, Quant: 1.85 ug/mL-FEU — ABNORMAL HIGH (ref 0.00–0.50)

## 2020-05-29 LAB — BRAIN NATRIURETIC PEPTIDE: B Natriuretic Peptide: 45 pg/mL (ref 0.0–100.0)

## 2020-05-29 MED ORDER — ACETAMINOPHEN 325 MG PO TABS: 650.0000 mg | ORAL_TABLET | Freq: Four times a day (QID) | ORAL | Status: DC | PRN

## 2020-05-29 MED ORDER — LORATADINE 10 MG PO TABS
10.0000 mg | ORAL_TABLET | Freq: Every day | ORAL | Status: DC
  Administered 2020-05-31 – 2020-06-01 (×2): 10 mg via ORAL
  Filled 2020-05-29 (×2): qty 1

## 2020-05-29 MED ORDER — HEPARIN (PORCINE) 25000 UT/250ML-% IV SOLN
1200.0000 [IU]/h | INTRAVENOUS | Status: DC
  Administered 2020-05-30 (×2): 1200 [IU]/h via INTRAVENOUS
  Filled 2020-05-29 (×2): qty 250

## 2020-05-29 MED ORDER — ACETAMINOPHEN 650 MG RE SUPP: 650.0000 mg | Freq: Four times a day (QID) | RECTAL | Status: DC | PRN

## 2020-05-29 MED ORDER — HYDROXYZINE HCL 10 MG PO TABS
10.0000 mg | ORAL_TABLET | Freq: Every day | ORAL | Status: DC
  Administered 2020-05-30 – 2020-05-31 (×2): 10 mg via ORAL
  Filled 2020-05-29 (×4): qty 1

## 2020-05-29 MED ORDER — MORPHINE SULFATE (PF) 2 MG/ML IV SOLN
2.0000 mg | INTRAVENOUS | Status: DC | PRN
  Administered 2020-05-30 (×2): 2 mg via INTRAVENOUS
  Filled 2020-05-29 (×2): qty 1

## 2020-05-29 MED ORDER — KETOROLAC TROMETHAMINE 0.5 % OP SOLN
1.0000 [drp] | Freq: Four times a day (QID) | OPHTHALMIC | Status: DC
  Administered 2020-05-30 – 2020-06-01 (×6): 1 [drp] via OPHTHALMIC
  Filled 2020-05-29: qty 5

## 2020-05-29 MED ORDER — PANTOPRAZOLE SODIUM 40 MG PO TBEC
40.0000 mg | DELAYED_RELEASE_TABLET | Freq: Every day | ORAL | Status: DC
  Administered 2020-05-31 – 2020-06-01 (×2): 40 mg via ORAL
  Filled 2020-05-29 (×2): qty 1

## 2020-05-29 MED ORDER — HEPARIN BOLUS VIA INFUSION
4000.0000 [IU] | Freq: Once | INTRAVENOUS | Status: AC
  Administered 2020-05-30: 4000 [IU] via INTRAVENOUS

## 2020-05-29 MED ORDER — ASPIRIN EC 81 MG PO TBEC
81.0000 mg | DELAYED_RELEASE_TABLET | Freq: Every day | ORAL | Status: DC
  Administered 2020-05-31: 81 mg via ORAL
  Filled 2020-05-29: qty 1

## 2020-05-29 MED ORDER — RILUZOLE 50 MG PO TABS
50.0000 mg | ORAL_TABLET | Freq: Two times a day (BID) | ORAL | Status: DC
  Administered 2020-05-31 – 2020-06-01 (×2): 50 mg via ORAL

## 2020-05-29 MED ORDER — FLUTICASONE-UMECLIDIN-VILANT 200-62.5-25 MCG/INH IN AEPB: 1.0000 | INHALATION_SPRAY | Freq: Every day | RESPIRATORY_TRACT | Status: DC

## 2020-05-29 MED ORDER — ONDANSETRON HCL 4 MG PO TABS: 4.0000 mg | ORAL_TABLET | Freq: Four times a day (QID) | ORAL | Status: DC | PRN

## 2020-05-29 MED ORDER — IOHEXOL 300 MG/ML  SOLN
100.0000 mL | Freq: Once | INTRAMUSCULAR | Status: AC | PRN
  Administered 2020-05-29: 100 mL via INTRAVENOUS

## 2020-05-29 MED ORDER — PREDNISOLONE ACETATE 1 % OP SUSP
1.0000 [drp] | Freq: Four times a day (QID) | OPHTHALMIC | Status: DC
  Administered 2020-05-30 – 2020-06-01 (×7): 1 [drp] via OPHTHALMIC
  Filled 2020-05-29: qty 5

## 2020-05-29 MED ORDER — CLOBETASOL PROPIONATE 0.05 % EX CREA
1.0000 "application " | TOPICAL_CREAM | Freq: Two times a day (BID) | CUTANEOUS | Status: DC
  Administered 2020-05-31: 1 via TOPICAL
  Filled 2020-05-29 (×2): qty 15

## 2020-05-29 MED ORDER — ONDANSETRON HCL 4 MG/2ML IJ SOLN: 4.0000 mg | Freq: Four times a day (QID) | INTRAMUSCULAR | Status: DC | PRN

## 2020-05-29 MED ORDER — IOHEXOL 350 MG/ML SOLN: 100.0000 mL | Freq: Once | INTRAVENOUS | Status: DC | PRN

## 2020-05-29 MED ORDER — IOHEXOL 350 MG/ML SOLN
100.0000 mL | Freq: Once | INTRAVENOUS | Status: AC | PRN
  Administered 2020-05-29: 75 mL via INTRAVENOUS

## 2020-05-29 MED ORDER — ALLOPURINOL 300 MG PO TABS
300.0000 mg | ORAL_TABLET | Freq: Every day | ORAL | Status: DC
  Administered 2020-05-31 – 2020-06-01 (×2): 300 mg via ORAL
  Filled 2020-05-29 (×2): qty 1

## 2020-05-29 NOTE — ED Provider Notes (Signed)
  Face-to-face evaluation   History: He presents BMS for evaluation of abdominal pain.  He reports that it started this morning after he had a bowel movement.  Later today he had used a fleets suppository, to see if it helped the pain.  He has had early satiety, and somewhat decreased appetite for about 2 weeks.  He states that his doctor at [facility], gave him a prescription to "help my digestion."  This morning he spit up a little bit of mucus but has not been vomiting.  He has not had a fever.  Patient has been evaluated by a pulmonologist at [facility], for abnormal lung mechanics related to ALS.  He has ordered a Trilogy device.   Physical exam: Overweight elderly male who has upper extremity weakness from ALS.  Abdomen somewhat distended, soft and nontender to palpation.  Extremities are without edema.  Medical screening examination/treatment/procedure(s) were conducted as a shared visit with non-physician practitioner(s) and myself.  I personally evaluated the patient during the encounter    Daleen Bo, MD 06/08/20 1346

## 2020-05-29 NOTE — Consult Note (Signed)
Urology Consult  Referring physician: Olevia Bowens Reason for referral: ureteral stone  Chief Complaint: ureteral stone  History of Present Illness: male with single left kidney and ureter stone; was having s/p pain Serum Cr 1.15 lytes normal CBC normal  CT scan: mild to moderate hydro left with two proximal stones largest 7 mm; small stones in bladder Notified patient has bilateral pulmonary emboli No stone history; has penile prosthesis  NO prostate surgery  Past Medical History:  Diagnosis Date  . ALS (amyotrophic lateral sclerosis) ([place]) 2021  . Anemia    low iron  . Arthritis   . Atypical chest pain 01/04/2020  . Colonic polyp   . Constipation   . COPD (chronic obstructive pulmonary disease) ([place])   . Coronary artery calcification seen on CAT scan 01/04/2020  . GERD (gastroesophageal reflux disease)   . Gout   . History of kidney stones   . Malignant tumor of kidney ([place])   . Muscle atrophy   . Pain    LOWER BACK AND LEFT HIP - HX OF PREVIOUS LUMBAR AND CERVICAL SURGERY--PT STATES HE HAS HAD NUMBNESS IN BOTH FEET SINCE HIS BACK SURGERY  . Pure hypercholesterolemia 01/04/2020  . Renal calculus   . Retinal detachment   . Shortness of breath 01/04/2020   Past Surgical History:  Procedure Laterality Date  . BACK SURGERY     LUMBAR SURGERY  . CATARACT EXTRACTION Bilateral 2003   Surgeon unknown  . EYE SURGERY     BILATERAL CATARACT EXTRACTIONS  . GIVENS CAPSULE STUDY N/A 10/07/2015   Procedure: GIVENS CAPSULE STUDY;  Surgeon: Carol Ada, MD;  Location: [facility];  Service: Endoscopy;  Laterality: N/A;  . HAND SURGERY     left  . KNEE SURGERY     right  . LAMINECTOMY     cervical  . LASER PHOTO ABLATION Left 08/19/2017   Procedure: LASER PHOTO ABLATION;  Surgeon: [name], MD;  Location: [facility];  Service: Ophthalmology;  Laterality: Left;  . MEMBRANE PEEL Left 03/22/2017   Procedure: POSSIBLE MEMBRANE PEEL WITH SILICONE OIL AND PERFLURON;  Surgeon: [name], MD;   Location: [facility];  Service: Ophthalmology;  Laterality: Left;  . NEPHRECTOMY     PT STATES RIGHT KIDNEY WAS REMOVED  . PAROTIDECTOMY Right 06/13/2018   Procedure: RIGHT TOTAL PAROTIDECTOMY;  Surgeon: Leta Baptist, MD;  Location: [facility];  Service: ENT;  Laterality: Right;  . PARS PLANA VITRECTOMY Left 03/22/2017   Procedure: PARS PLANA VITRECTOMY WITH 25 GAUGE;  Surgeon: [name], MD;  Location: [facility];  Service: Ophthalmology;  Laterality: Left;  . PARS PLANA VITRECTOMY W/ SCLERAL BUCKLE Left 08/19/2017   Procedure: 58 GAUGE PARS PLANA VITRECTOMY WITH SILCONE OIL REMOVAL;  Surgeon: [name], MD;  Location: [facility];  Service: Ophthalmology;  Laterality: Left;  . 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 SALIVARY STONE REMOVAL Right 02/21/2019   Procedure: RIGHT PAROTID FISTULA REPAIR;  Surgeon: Leta Baptist, MD;  Location: [facility];  Service: ENT;  Laterality: Right;  . SHOULDER SURGERY  2020   [facility]  . TOTAL KNEE ARTHROPLASTY Left 10/19/2012   Procedure: LEFT TOTAL KNEE ARTHROPLASTY;  Surgeon: Magnus Sinning, MD;  Location: [facility];  Service: Orthopedics;  Laterality: Left;  [name] VITRECTOMY 25 GAUGE WITH SCLERAL BUCKLE Left 02/17/2017   Procedure: VITRECTOMY 25 GAUGE WITH SCLERAL BUCKLE membrane peel, injection of silicone oil, and endolaser photocoagulation.;  Surgeon: [name], MD;  Location: [facility];  Service: Ophthalmology;  Laterality: Left;    Medications: I have reviewed the patient's current medications. Allergies:  Allergies  Allergen Reactions  . Lipitor [Atorvastatin] Rash and Other (See Comments)    Passed out    Family History  Problem Relation Age of Onset  . Diabetes Mother   . COPD Brother   . COPD Sister   . Heart attack Father   . Amblyopia Neg Hx   . Blindness Neg Hx   . Cataracts Neg Hx   . Glaucoma Neg Hx   . Macular degeneration Neg Hx   . Retinal detachment Neg Hx   . Retinitis pigmentosa Neg Hx    Social History:  reports that  he quit smoking about 24 years ago. His smoking use included cigarettes and pipe. He has a 80.00 pack-year smoking history. He has never used smokeless tobacco. He reports that he does not drink alcohol and does not use drugs.  ROS: All systems are reviewed and negative except as noted. Rest negative  Physical Exam:  Vital signs in last 24 hours: Temp:  [98.3 F (36.8 C)] 98.3 F (36.8 C) (12/22 1419) Pulse Rate:  [65-88] 65 (12/22 2000) Resp:  [18-26] 23 (12/22 2000) BP: (128-140)/(73-85) 140/73 (12/22 2000) SpO2:  [90 %-97 %] 97 % (12/22 2000) Weight:  [81.6 kg] 81.6 kg (12/22 1420)  Cardiovascular: Skin warm; not flushed Respiratory: Breaths quiet; no shortness of breath Abdomen: No masses Neurological: Normal sensation to touch Musculoskeletal: Normal motor function arms and legs Lymphatics: No inguinal adenopathy Skin: No rashes Genitourinary:prostheses penis   Laboratory Data:  Results for orders placed or performed during the hospital encounter of 05/29/20 (from the past 72 hour(s))  Urinalysis, Routine w reflex microscopic Urine, Clean Catch     Status: Abnormal   Collection Time: 05/29/20  2:21 PM  Result Value Ref Range   Color, Urine YELLOW YELLOW   APPearance HAZY (A) CLEAR   Specific Gravity, Urine 1.015 1.005 - 1.030   pH 7.0 5.0 - 8.0   Glucose, UA NEGATIVE NEGATIVE mg/dL   Hgb urine dipstick LARGE (A) NEGATIVE   Bilirubin Urine NEGATIVE NEGATIVE   Ketones, ur NEGATIVE NEGATIVE mg/dL   Protein, ur TRACE (A) NEGATIVE mg/dL   Nitrite NEGATIVE NEGATIVE   Leukocytes,Ua SMALL (A) NEGATIVE    Comment: Performed at [facility], [address] [place], [place] [address]  Urinalysis, Microscopic (reflex)     Status: Abnormal   Collection Time: 05/29/20  2:21 PM  Result Value Ref Range   RBC / HPF >50 0 - 5 RBC/hpf   WBC, UA 0-5 0 - 5 WBC/hpf   Bacteria, UA FEW (A) NONE SEEN   Squamous Epithelial / LPF 0-5 0 - 5    Comment: Performed at [facility], [address], [place], [place] [address]  CBC with Differential/Platelet     Status: Abnormal   Collection Time: 05/29/20  4:40 PM  Result Value Ref Range   WBC 10.0 4.0 - 10.5 K/uL   RBC 4.43 4.22 - 5.81 MIL/uL   Hemoglobin 12.2 (L) 13.0 - 17.0 g/dL   HCT 40.1 39.0 - 52.0 %   MCV 90.5 80.0 - 100.0 fL   MCH 27.5 26.0 - 34.0 pg   MCHC 30.4 30.0 - 36.0 g/dL   RDW 17.2 (H) 11.5 - 15.5 %   Platelets 202 150 - 400 K/uL   nRBC 0.0 0.0 - 0.2 %   Neutrophils Relative % 88 %   Neutro Abs 8.6 (H) 1.7 -  7.7 K/uL   Lymphocytes Relative 8 %   Lymphs Abs 0.8 0.7 - 4.0 K/uL   Monocytes Relative 4 %   Monocytes Absolute 0.4 0.1 - 1.0 K/uL   Eosinophils Relative 0 %   Eosinophils Absolute 0.0 0.0 - 0.5 K/uL   Basophils Relative 0 %   Basophils Absolute 0.0 0.0 - 0.1 K/uL   Immature Granulocytes 0 %   Abs Immature Granulocytes 0.04 0.00 - 0.07 K/uL    Comment: Performed at [facility], [address] [place], [place] [address]  D-dimer, quantitative (not at [facility])     Status: Abnormal   Collection Time: 05/29/20  4:40 PM  Result Value Ref Range   D-Dimer, Quant 1.85 (H) 0.00 - 0.50 ug/mL-FEU    Comment: (NOTE) At the manufacturer cut-off value of 0.5 g/mL FEU, this assay has a negative predictive value of 95-100%.This assay is intended for use in conjunction with a clinical pretest probability (PTP) assessment model to exclude pulmonary embolism (PE) and deep venous thrombosis (DVT) in outpatients suspected of PE or DVT. Results should be correlated with clinical presentation. Performed at [facility], [address], [place], [place] [address]   Comprehensive metabolic panel     Status: Abnormal   Collection Time: 05/29/20  4:40 PM  Result Value Ref Range   Sodium 139 135 - 145 mmol/L   Potassium 4.4 3.5 - 5.1 mmol/L   Chloride 105 98 - 111 mmol/L   CO2 24 22 - 32 mmol/L   Glucose, Bld 122 (H) 70 - 99 mg/dL    Comment: Glucose reference range applies only to samples taken after fasting for at  least 8 hours.   BUN 23 8 - 23 mg/dL   Creatinine, Ser 1.15 0.61 - 1.24 mg/dL   Calcium 9.8 8.9 - 10.3 mg/dL   Total Protein 6.6 6.5 - 8.1 g/dL   Albumin 3.4 (L) 3.5 - 5.0 g/dL   AST 29 15 - 41 U/L   ALT 25 0 - 44 U/L   Alkaline Phosphatase 67 38 - 126 U/L   Total Bilirubin 0.7 0.3 - 1.2 mg/dL   GFR, Estimated >60 >60 mL/min    Comment: (NOTE) Calculated using the CKD-EPI Creatinine Equation (2021)    Anion gap 10 5 - 15    Comment: Performed at [facility], [address], [place], [place] [address]  Lipase, blood     Status: None   Collection Time: 05/29/20  4:40 PM  Result Value Ref Range   Lipase 17 11 - 51 U/L    Comment: Performed at [facility], [address], [place], [place] [address]  Brain natriuretic peptide     Status: None   Collection Time: 05/29/20  4:40 PM  Result Value Ref Range   B Natriuretic Peptide 45.0 0.0 - 100.0 pg/mL    Comment: Performed at [facility], [address] [place], [place] [address]   No results found for this or any previous visit (from the past 240 hour(s)). Creatinine: Recent Labs    05/29/20 1640  CREATININE 1.15    Xrays: See report/chart noted  Impression/Assessment:  Left proximal ureter stones single left kidney; small bladder stones   Plan:  Picture drawn - stent; pros and cons and risks and failure and sequelae; irrigate bladder stones  Byrd Rushlow A Treylan Mcclintock 05/29/2020, 9:22 PM

## 2020-05-29 NOTE — Progress Notes (Signed)
ANTICOAGULATION CONSULT NOTE - Initial Consult  Pharmacy Consult for heparin Indication: pulmonary embolus  Allergies  Allergen Reactions  . Lipitor [Atorvastatin] Rash and Other (See Comments)    Passed out    Patient Measurements: Height: 6\' 1"  (185.4 cm) Weight: 81.6 kg (180 lb) IBW/kg (Calculated) : 79.9  Vital Signs: Temp: 98.3 F (36.8 C) (12/22 1419) Temp Source: Oral (12/22 1419) BP: 140/73 (12/22 2000) Pulse Rate: 65 (12/22 2000)  Labs: Recent Labs    05/29/20 1640  HGB 12.2*  HCT 40.1  PLT 202  CREATININE 1.15    Estimated Creatinine Clearance: 57.9 mL/min (by C-G formula based on SCr of 1.15 mg/dL).   Medical History: Past Medical History:  Diagnosis Date  . ALS (amyotrophic lateral sclerosis) ([place]) 2021  . Anemia    low iron  . Arthritis   . Atypical chest pain 01/04/2020  . Colonic polyp   . Constipation   . COPD (chronic obstructive pulmonary disease) ([place])   . Coronary artery calcification seen on CAT scan 01/04/2020  . GERD (gastroesophageal reflux disease)   . Gout   . History of kidney stones   . Malignant tumor of kidney ([place])   . Muscle atrophy   . Pain    LOWER BACK AND LEFT HIP - HX OF PREVIOUS LUMBAR AND CERVICAL SURGERY--PT STATES HE HAS HAD NUMBNESS IN BOTH FEET SINCE HIS BACK SURGERY  . Pure hypercholesterolemia 01/04/2020  . Renal calculus   . Retinal detachment   . Shortness of breath 01/04/2020    Assessment: 79yo male w/ ALS c/o abdominal pain, found to have small bladder stones, requiring 2L O2 which is unusual for him >> D-dimer elevated >> CT reveals small PE, to begin heparin.  Goal of Therapy:  Heparin level 0.3-0.7 units/ml Monitor platelets by anticoagulation protocol: Yes   Plan:  Will give heparin 4000 units IV bolus x1 followed by gtt at 1200 units/hr and monitor heparin levels and CBC.  Wynona Neat, PharmD, BCPS  05/29/2020,10:55 PM

## 2020-05-29 NOTE — H&P (Signed)
History and Physical    Jerome Mays DOB: [birth year] DOA: 05/29/2020  PCP: [name], MD   Patient coming from: Home.   I have personally briefly reviewed patient's old medical records in [place]  Chief Complaint: Left flank pain.  HPI: Jerome Mays is a 79 y.o. male with medical history significant of ALS, iron deficiency anemia, osteoarthritis, atypical chest pain, colon polyps, constipation, COPD, CAD, GERD, gout, urolithiasis, RCC with right nephrectomy, hyperlipidemia, retinal detachment who is coming to the emergency department due to left flank pain since earlier in the morning today associated with nausea/one episode of emesis with minimal mucus content and recent decrease in appetite.  He denies he used a Fleet enema at home because he thought was constipated without significant relief of his pain.  No dysuria, frequency or gross hematuria.  He denies fever, chills, but feels fatigued.  No sore throat, rhinorrhea, but gets frequent nasal congestion.  Denies recent wheezing or hemoptysis.  No chest pain, dyspnea, palpitations, dizziness or diaphoresis, PND, orthopnea or pitting edema of the lower extremities.  He has been mildly dyspneic.  He was found to be hypoxic in the mid 80s to low 90s and had to be placed on nasal cannula oxygen at 2 LPM.  ED Course: Initial vital signs were temperature 98.3 F, pulse 88, respirations 20, BP 130/83 mmHg O2 sat 91% on room air.  Labwork: Urinalysis was hazy in appearance with large hemoglobin, trace proteinuria and small leukocyte esterase.  Microscopic examination shows more than 50 RBC, 0-5 WBC and few bacteria.  D-dimer was 1.85.  SARS 2 and influenza PCR was negative.  BNP 45.0 pg/mL.  CMP shows a glucose of 122 mg/dL and albumin of 3.4 g/dL.  All other values are within expected limits.  Lipase was normal at 17 units/L.  Imaging: Portable 1 view chest radiograph shows chronic low changes with evidence of  pneumonia or edema.  CT abdomen/pelvis showed left-sided mild to moderate hydroureteronephrosis secondary to an obstructing set of stone in the proximal left ureter.  Multiple small cysts in the left kidney.  Outpatient ultrasound suggested by radiology.  CTA chest shows 2 small segmental pulmonary emboli in the right middle and lower lobes.  These may be cures to acute eating age. There is increased dependent atelectasis in both lower lobes There is a new focal ill pulmonary opacity in the anterior right lung apex for which follow-up CT is recommended in 2 to 3 months.  .  Please see images and full regular report for further detail.  Review of Systems: As per HPI otherwise all other systems reviewed and are negative.  Past Medical History:  Diagnosis Date  . ALS (amyotrophic lateral sclerosis) ([place]) 2021  . Anemia    low iron  . Arthritis   . Atypical chest pain 01/04/2020  . Colonic polyp   . Constipation   . COPD (chronic obstructive pulmonary disease) ([place])   . Coronary artery calcification seen on CAT scan 01/04/2020  . GERD (gastroesophageal reflux disease)   . Gout   . History of kidney stones   . Malignant tumor of kidney ([place])   . Muscle atrophy   . Pain    LOWER BACK AND LEFT HIP - HX OF PREVIOUS LUMBAR AND CERVICAL SURGERY--PT STATES HE HAS HAD NUMBNESS IN BOTH FEET SINCE HIS BACK SURGERY  . Pure hypercholesterolemia 01/04/2020  . Renal calculus   . Retinal detachment   . Shortness of breath 01/04/2020  Past Surgical History:  Procedure Laterality Date  . BACK SURGERY     LUMBAR SURGERY  . CATARACT EXTRACTION Bilateral 2003   Surgeon unknown  . EYE SURGERY     BILATERAL CATARACT EXTRACTIONS  . GIVENS CAPSULE STUDY N/A 10/07/2015   Procedure: GIVENS CAPSULE STUDY;  Surgeon: Carol Ada, MD;  Location: [place];  Service: Endoscopy;  Laterality: N/A;  . HAND SURGERY     left  . KNEE SURGERY     right  . LAMINECTOMY     cervical  . LASER PHOTO ABLATION Left  08/19/2017   Procedure: LASER PHOTO ABLATION;  Surgeon: [name], MD;  Location: [place];  Service: Ophthalmology;  Laterality: Left;  . MEMBRANE PEEL Left 03/22/2017   Procedure: POSSIBLE MEMBRANE PEEL WITH SILICONE OIL AND PERFLURON;  Surgeon: [name], MD;  Location: [place];  Service: Ophthalmology;  Laterality: Left;  . NEPHRECTOMY     PT STATES RIGHT KIDNEY WAS REMOVED  . PAROTIDECTOMY Right 06/13/2018   Procedure: RIGHT TOTAL PAROTIDECTOMY;  Surgeon: Leta Baptist, MD;  Location: [place];  Service: ENT;  Laterality: Right;  . PARS PLANA VITRECTOMY Left 03/22/2017   Procedure: PARS PLANA VITRECTOMY WITH 25 GAUGE;  Surgeon: [name], MD;  Location: [place];  Service: Ophthalmology;  Laterality: Left;  . PARS PLANA VITRECTOMY W/ SCLERAL BUCKLE Left 08/19/2017   Procedure: 68 GAUGE PARS PLANA VITRECTOMY WITH SILCONE OIL REMOVAL;  Surgeon: [name], MD;  Location: [place];  Service: Ophthalmology;  Laterality: Left;  . 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 SALIVARY STONE REMOVAL Right 02/21/2019   Procedure: RIGHT PAROTID FISTULA REPAIR;  Surgeon: Leta Baptist, MD;  Location: [place];  Service: ENT;  Laterality: Right;  . SHOULDER SURGERY  2020   [facility]  . TOTAL KNEE ARTHROPLASTY Left 10/19/2012   Procedure: LEFT TOTAL KNEE ARTHROPLASTY;  Surgeon: Magnus Sinning, MD;  Location: [facility];  Service: Orthopedics;  Laterality: Left;  [name] VITRECTOMY 25 GAUGE WITH SCLERAL BUCKLE Left 02/17/2017   Procedure: VITRECTOMY 25 GAUGE WITH SCLERAL BUCKLE membrane peel, injection of silicone oil, and endolaser photocoagulation.;  Surgeon: [name], MD;  Location: [place];  Service: Ophthalmology;  Laterality: Left;   Social History  reports that he quit smoking about 24 years ago. His smoking use included cigarettes and pipe. He has a 80.00 pack-year smoking history. He has never used smokeless tobacco. He reports that he does not drink alcohol and does not use  drugs.  Allergies  Allergen Reactions  . Lipitor [Atorvastatin] Rash and Other (See Comments)    Passed out   Family History  Problem Relation Age of Onset  . Diabetes Mother   . COPD Brother   . COPD Sister   . Heart attack Father   . Amblyopia Neg Hx   . Blindness Neg Hx   . Cataracts Neg Hx   . Glaucoma Neg Hx   . Macular degeneration Neg Hx   . Retinal detachment Neg Hx   . Retinitis pigmentosa Neg Hx    Prior to Admission medications   Medication Sig Start Date End Date Taking? Authorizing Provider  allopurinol (ZYLOPRIM) 300 MG tablet Take 300 mg by mouth daily.   Yes [provider]  aspirin EC 81 MG tablet Take 81 mg by mouth daily.   Yes [provider]  b complex vitamins tablet Take 1 tablet by mouth daily.   Yes [provider]  B Complex-C (B-COMPLEX WITH  VITAMIN C) tablet Take 1 tablet by mouth daily.   Yes [provider]  cetirizine (ZYRTEC) 10 MG tablet Take 10 mg by mouth daily.   Yes [provider]  cholecalciferol (VITAMIN D3) 25 MCG (1000 UT) tablet Take 1,000 Units by mouth daily.   Yes [provider]  clobetasol cream (TEMOVATE) 7.34 % Apply 1 application topically 2 (two) times daily.   Yes [provider]  Fluticasone-Umeclidin-Vilant (TRELEGY ELLIPTA) 200-62.5-25 MCG/INH AEPB Inhale 1 puff into the lungs daily. 03/12/20  Yes Mannam, Praveen, MD  hydrOXYzine (ATARAX/VISTARIL) 10 MG tablet Take 10 mg by mouth at bedtime.   Yes [provider]  ketorolac (ACULAR) 0.5 % ophthalmic solution Place 1 drop into the left eye 4 (four) times daily. 08/29/19 08/28/20 Yes [name], MD  metoprolol tartrate (LOPRESSOR) 50 MG tablet TAKE 1 TABLET 2 HOURS PRIOR TO CT 01/04/20  Yes Skeet Latch, MD  Multiple Vitamin (ONE-A-DAY MENS PO) Take 1 tablet by mouth daily.   Yes [provider]  pantoprazole (PROTONIX) 40 MG tablet Take 1 tablet (40 mg total) by mouth daily. 1/93/79  Yes Delora Fuel, MD  prednisoLONE acetate (PRED FORTE) 1 % ophthalmic suspension Place 1 drop into the left eye 4 (four) times daily. 04/05/20  Yes [name], MD  riluzole (RILUTEK) 50 MG tablet Take 1 tablet (50 mg total) by mouth every 12 (twelve) hours. 07/06/19  Yes Marcial Pacas, MD  prednisoLONE acetate (PRED FORTE) 1 % ophthalmic suspension Place 1 drop into the left eye 4 (four) times daily. Patient not taking: No sig reported 08/29/19   [name], MD  sodium chloride (MURO 128) 5 % ophthalmic solution Place 1 drop into the left eye in the morning, at noon, in the evening, and at bedtime. Patient not taking: No sig reported 03/10/20   [name], MD   Physical Exam: Vitals:   05/29/20 1800 05/29/20 1830 05/29/20 1900 05/29/20 2000  BP: 129/80 138/85 135/74 140/73  Pulse: 73 75 73 65  Resp: 20 19 (!) 25 (!) 23  Temp:      TempSrc:      SpO2: 95% 95% 94% 97%  Weight:      Height:       Constitutional: NAD, calm, comfortable Eyes: PERRL, lids and conjunctivae normal ENMT: Mucous membranes are moist. Posterior pharynx clear of any exudate or lesions. Neck: normal, supple, no masses, no thyromegaly Respiratory: clear to auscultation bilaterally, no wheezing, no crackles. Normal respiratory effort. No accessory muscle use.  Cardiovascular: Regular rate and rhythm, no murmurs / rubs / gallops. No extremity edema. 2+ pedal pulses. No carotid bruits.  Abdomen: No distention.  Bowel sounds positive.  Soft, positive left flank and suprapubic tenderness, left CVA tenderness, no guarding or rebound, no masses palpated. No hepatosplenomegaly. Musculoskeletal: Generalized weakness.  No clubbing / cyanosis. Good ROM, no contractures. Normal muscle tone.  Skin: no rashes, lesions, ulcers on limited dermatological examination. Neurologic: CN 2-12 grossly intact. Sensation intact, DTR normal. Strength 5/5 in all 4.  Psychiatric: Normal judgment and insight. Alert and oriented x 3. Normal mood.   Labs  on Admission: I have personally reviewed following labs and imaging studies  CBC: Recent Labs  Lab 05/29/20 1640  WBC 10.0  NEUTROABS 8.6*  HGB 12.2*  HCT 40.1  MCV 90.5  PLT 024    Basic Metabolic Panel: Recent Labs  Lab 05/29/20 1640  NA 139  K 4.4  CL 105  CO2 24  GLUCOSE  122*  BUN 23  CREATININE 1.15  CALCIUM 9.8    GFR: Estimated Creatinine Clearance: 57.9 mL/min (by C-G formula based on SCr of 1.15 mg/dL).  Liver Function Tests: Recent Labs  Lab 05/29/20 1640  AST 29  ALT 25  ALKPHOS 67  BILITOT 0.7  PROT 6.6  ALBUMIN 3.4*    Urine analysis:    Component Value Date/Time   COLORURINE YELLOW 05/29/2020 1421   APPEARANCEUR HAZY (A) 05/29/2020 1421   LABSPEC 1.015 05/29/2020 1421   PHURINE 7.0 05/29/2020 1421   GLUCOSEU NEGATIVE 05/29/2020 1421   HGBUR LARGE (A) 05/29/2020 1421   BILIRUBINUR NEGATIVE 05/29/2020 1421   KETONESUR NEGATIVE 05/29/2020 1421   PROTEINUR TRACE (A) 05/29/2020 1421   NITRITE NEGATIVE 05/29/2020 1421   LEUKOCYTESUR SMALL (A) 05/29/2020 1421    Radiological Exams on Admission: CT Angio Chest PE W/Cm &/Or Wo Cm  Result Date: 05/29/2020 CLINICAL DATA:  Shortness of breath and elevated D-dimer. Clinical suspicion for pulmonary embolism. EXAM: CT ANGIOGRAPHY CHEST WITH CONTRAST TECHNIQUE: Multidetector CT imaging of the chest was performed using the standard protocol during bolus administration of intravenous contrast. Multiplanar CT image reconstructions and MIPs were obtained to evaluate the vascular anatomy. CONTRAST:  40mL OMNIPAQUE IOHEXOL 350 MG/ML SOLN COMPARISON:  02/16/2020 FINDINGS: Cardiovascular: 2 small filling defects are seen within subsegmental pulmonary artery branches in the anterior right middle lobe (image 178/6), and in the posterior right lower lobe (image 195/6). These are new since previous study of 02/16/2020, and may be represent acute or subacute pulmonary emboli. No other sites of pulmonary embolism  identified. No evidence of thoracic aortic dissection or aneurysm. Mediastinum/Nodes: Stable mild mediastinal and hilar lymphadenopathy with central calcifications, consistent with old granulomatous disease. No new or increased sites of lymphadenopathy identified. Lungs/Pleura: Moderate to severe centrilobular emphysema is seen. New focal ill-defined pulmonary opacity is seen in the anterior right lung apex, likely infectious or inflammatory etiology with neoplasm considered less likely. Dependent atelectasis in both lower lobes has increased since prior exam. No evidence of pleural effusion. Upper abdomen: No acute findings. Musculoskeletal: No suspicious bone lesions identified. Review of the MIP images confirms the above findings. IMPRESSION: 2 small subsegmental pulmonary emboli in right middle and lower lobes which are new since 02/16/2020 exam, and may be acute or subacute in age. New focal ill-defined pulmonary opacity in anterior right lung apex, likely infectious or inflammatory etiology with neoplasm considered less likely. Recommend continued follow-up by CT in 2-3 months. Increased dependent atelectasis in both lower lobes. Stable mild mediastinal and hilar lymphadenopathy, consistent with old granulomatous disease. Emphysema (ICD10-J43.9). Electronically Signed   By: Marlaine Hind M.D.   On: 05/29/2020 20:49   CT Abdomen Pelvis W Contrast  Result Date: 05/29/2020 CLINICAL DATA:  Abdominal pain. Suprapubic abdominal pain. History of prior nephrectomy. EXAM: CT ABDOMEN AND PELVIS WITH CONTRAST TECHNIQUE: Multidetector CT imaging of the abdomen and pelvis was performed using the standard protocol following bolus administration of intravenous contrast. CONTRAST:  149mL OMNIPAQUE IOHEXOL 300 MG/ML  SOLN COMPARISON:  CT dated 07/14/2012 FINDINGS: Lower chest: There is atelectasis at the lung bases.The heart size appears to be mildly enlarged. There is suggestion of emphysematous changes at the lung bases.  Hepatobiliary: The liver is normal. Normal gallbladder.There is no biliary ductal dilation. Pancreas: Normal contours without ductal dilatation. No peripancreatic fluid collection. Spleen: Unremarkable. Adrenals/Urinary Tract: --Adrenal glands: Unremarkable. --Right kidney/ureter: The right adrenal gland is surgically absent. --Left kidney/ureter: There is left-sided mild to moderate hydroureteronephrosis  secondary to an obstructing set of stones in the proximal left ureter. The largest stone measures up to approximately 7 mm (axial series 2, image 46). There are additional punctate nonobstructing stones throughout the left kidney. There are multiple small cystic appearing lesions involving the left kidney some of these measure above water density and therefore are indeterminate on this study. --Urinary bladder: Multiple small bladder stones/gravel are noted in the patient's urinary bladder. The urinary bladder is mildly distended. Stomach/Bowel: --Stomach/Duodenum: No hiatal hernia or other gastric abnormality. Normal duodenal course and caliber. --Small bowel: Unremarkable. --Colon: Rectosigmoid diverticulosis without acute inflammation. --Appendix: Not visualized. No right lower quadrant inflammation or free fluid. Vascular/Lymphatic: Atherosclerotic calcification is present within the non-aneurysmal abdominal aorta, without hemodynamically significant stenosis. --No retroperitoneal lymphadenopathy. --No mesenteric lymphadenopathy. --No pelvic or inguinal lymphadenopathy. Reproductive: There is a penile prosthesis in place. Other: No ascites or free air. The abdominal wall is normal. Musculoskeletal. No acute displaced fractures. IMPRESSION: 1. Left-sided mild to moderate hydroureteronephrosis secondary to an obstructing set of stones in the proximal left ureter. The largest stone measures up to approximately 7 mm. 2. Multiple small bladder stones/gravel are noted. 3. Rectosigmoid diverticulosis without acute  inflammation. 4. There are multiple small cystic appearing lesions involving the left kidney some of these measure above water density and therefore are indeterminate on this study. Follow-up with a nonemergent outpatient renal ultrasound is recommended. 5. Status post right-sided nephrectomy. Aortic Atherosclerosis (ICD10-I70.0). Electronically Signed   By: Constance Holster M.D.   On: 05/29/2020 19:22   DG Chest Portable 1 View  Result Date: 05/29/2020 CLINICAL DATA:  New oxygen requirement. EXAM: PORTABLE CHEST 1 VIEW COMPARISON:  Chest radiographs 02/13/2020 and CTA 02/16/2020 FINDINGS: Telemetry leads overlie the chest. The cardiomediastinal silhouette is unchanged with normal heart size. Aortic atherosclerosis is noted. Lung volumes are lower than on the prior study. Chronic interstitial coarsening is similar to the prior radiographs with emphysema shown on CT. There may be a trace left pleural effusion. No pneumothorax is identified. No acute osseous abnormality is seen. IMPRESSION: Chronic lung changes without evidence of pneumonia or edema. Possible trace left pleural effusion. Electronically Signed   By: Logan Bores M.D.   On: 05/29/2020 15:18    EKG: Independently reviewed.  Vent. rate 84 BPM PR interval * ms QRS duration 112 ms QT/QTc 394/466 ms P-R-T axes 19 78 45 Sinus rhythm Ventricular premature complex Prolonged PR interval Incomplete right bundle branch block Low voltage, precordial leads Borderline ST elevation, lateral leads  Assessment/Plan Principal Problem:   Pulmonary emboli (HCC) Observation/progressive unit. Continue supplemental oxygen. Heparin per pharmacy. Obtain echocardiogram.  Active Problems:   Hydroureteronephrosis Will be transferred to [facility]. Urology planning cystoscopy for tomorrow.    COPD, group B, by GOLD 2017 classification ([place]) Continue Trelegy Ellipta. Supplemental oxygen as needed.    Gout Continue allopurinol.    ALS  (amyotrophic lateral sclerosis) ([place]) Supportive care. Continue Rilutek 50 mg p.o. every 12 hours.    GERD (gastroesophageal reflux disease) Continue pantoprazole 40 mg p.o. daily.    Mild protein malnutrition ([place]) Consult nutritional services.    DVT prophylaxis: On heparin infusion. Code Status:   Full code. Family Communication:  His wife was present in the ED room. Disposition Plan:   Patient is from:  Home.  Anticipated DC to:  Home.  Anticipated DC date:  05/31/2020.  Anticipated DC barriers: Cystoscopy/stent placement procedure.  Consults called:  Urology (Dr. Matilde Sprang) Admission status:  Observation/progressive unit.   Severity  of Illness: High due to presenting with abdominal pain secondary to left-sided urolithiasis/hydroureteronephrosis concomitantly with at least 2 right-sided pulmonary emboli.  The patient will need to be hospitalized for heparin infusion followed by cystoscopy/ureteral stent placement procedure.  Reubin Milan MD [facility]  How to contact the [facility] Attending or Consulting provider [place] or covering provider during after hours [place], for this patient?   1. Check the care team in [facility] and look for a) attending/consulting [facility] provider listed and b) the [facility] team listed 2. Log into [email or web address] and use [place]'s universal password to access. If you do not have the password, please contact the hospital operator. 3. Locate the [facility] provider you are looking for under [facility] and page to a number that you can be directly reached. 4. If you still have difficulty reaching the provider, please page the [facility] (Director on Call) for the Hospitalists listed on amion for assistance.  05/29/2020, 11:09 PM   This document was created using Dragon voice recognition software and may contain some unintended transcription errors.

## 2020-05-29 NOTE — ED Notes (Signed)
Pt to CT

## 2020-05-29 NOTE — ED Triage Notes (Signed)
Pt to er via ems, per ems pt is here for some abd pain, states that pt thinks that it might be gallstones, states that he has a hx of kidney removal and only has one kidney at this time, states that he also has a hx of als.  Pt states that his abd pain started this am when he was going to the bathroom, states that after his bm he had some lower abd pain.  States that he urinated a little later and his pain got better.

## 2020-05-29 NOTE — ED Provider Notes (Signed)
[facility] EMERGENCY DEPARTMENT Provider Note   CSN: [id number] Arrival date & time: 05/29/20  1414     History Chief Complaint  Patient presents with  . Abdominal Pain    Letta KocherRalph W Gunn is a 79 y.o. male with past medical history of ALS, anemia, COPD, CAD presents emergency department today for abdominal pain.  Patient states that he woke up this morning feeling okay, states that he went to go have a bowel movement and then had some suprapubic abdominal pain.  Patient states that it was a cramping sensation, was constant, however while being here pain has mostly resolved.  States that he is asymptomatic, unless you push on the area.  States that he was diagnosed with ALS last year, has [facility].  Denies any nausea, vomiting.  Denies any diarrhea or constipation.  Denies any hematochezia or tarry stools.  States that he was in his normal health yesterday.  Patient was found to be 85 to 91% on room air, was placed on 2 L of oxygen.  Patient states that he is not normally on oxygen, states that he is infrequently short of breath.  Could be related to his COPD.  Denying any shortness of breath currently, no chest pain.  Unsure what his oxygen saturation is normally at home.  Patient states that he lives at home with his wife who takes care of him.  Patient denies any dysuria hematuria.  Denies any urinary retention.  Patient states that he has had previous abdominal surgeries including kidney resection on R side.  States that he has not been able to pass gas since his bowel movement this morning.   HPI     Past Medical History:  Diagnosis Date  . ALS (amyotrophic lateral sclerosis) (HCC) 2021  . Anemia    low iron  . Arthritis   . Atypical chest pain 01/04/2020  . Colonic polyp   . Constipation   . COPD (chronic obstructive pulmonary disease) (HCC)   . Coronary artery calcification seen on CAT scan 01/04/2020  . GERD (gastroesophageal reflux disease)   .  Gout   . History of kidney stones   . Malignant tumor of kidney (HCC)   . Muscle atrophy   . Pain    LOWER BACK AND LEFT HIP - HX OF PREVIOUS LUMBAR AND CERVICAL SURGERY--PT STATES HE HAS HAD NUMBNESS IN BOTH FEET SINCE HIS BACK SURGERY  . Pure hypercholesterolemia 01/04/2020  . Renal calculus   . Retinal detachment   . Shortness of breath 01/04/2020    Patient Active Problem List   Diagnosis Date Noted  . Pulmonary emboli (HCC) 05/29/2020  . ALS (amyotrophic lateral sclerosis) (HCC) 02/13/2020  . Atypical chest pain 01/04/2020  . Coronary artery calcification seen on CAT scan 01/04/2020  . Pure hypercholesterolemia 01/04/2020  . Shortness of breath 01/04/2020  . 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 Weakness 05/15/2019  . Muscular atrophy 05/15/2019  . Fasciculation 05/15/2019  . Muscle weakness (generalized) 05/15/2019  . H/O parotidectomy 06/13/2018  . COPD, group B, by GOLD 2017 classification (HCC) 02/02/2017  . Hemoptysis 06/18/2016  . Stiffness of left knee 11/08/2012  . Difficulty in walking(719.7) 11/08/2012  . Gout 03/04/2012  . ENLARGEMENT OF LYMPH NODES 08/21/2009  . CLOS FRACTURE MID/PROXIMAL PHALANX/PHALANG HAND 07/31/2009  . 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 COPD with chronic bronchitis (HCC) 12/06/2007  . COLONIC POLYPS, HX OF 12/05/2007  . RENAL CALCULUS, HX OF 12/05/2007  Past Surgical History:  Procedure Laterality Date  . BACK SURGERY     LUMBAR SURGERY  . CATARACT EXTRACTION Bilateral 2003   Surgeon unknown  . EYE SURGERY     BILATERAL CATARACT EXTRACTIONS  . GIVENS CAPSULE STUDY N/A 10/07/2015   Procedure: GIVENS CAPSULE STUDY;  Surgeon: Carol Ada, MD;  Location: [place];  Service: Endoscopy;  Laterality: N/A;  . HAND SURGERY     left  . KNEE SURGERY     right  . LAMINECTOMY     cervical  . LASER PHOTO ABLATION Left 08/19/2017   Procedure: LASER PHOTO ABLATION;  Surgeon: [name], MD;  Location: [place];  Service:  Ophthalmology;  Laterality: Left;  . MEMBRANE PEEL Left 03/22/2017   Procedure: POSSIBLE MEMBRANE PEEL WITH SILICONE OIL AND PERFLURON;  Surgeon: [name], MD;  Location: [place];  Service: Ophthalmology;  Laterality: Left;  . NEPHRECTOMY     PT STATES RIGHT KIDNEY WAS REMOVED  . PAROTIDECTOMY Right 06/13/2018   Procedure: RIGHT TOTAL PAROTIDECTOMY;  Surgeon: Leta Baptist, MD;  Location: [place];  Service: ENT;  Laterality: Right;  . PARS PLANA VITRECTOMY Left 03/22/2017   Procedure: PARS PLANA VITRECTOMY WITH 25 GAUGE;  Surgeon: [name], MD;  Location: [place];  Service: Ophthalmology;  Laterality: Left;  . PARS PLANA VITRECTOMY W/ SCLERAL BUCKLE Left 08/19/2017   Procedure: 21 GAUGE PARS PLANA VITRECTOMY WITH SILCONE OIL REMOVAL;  Surgeon: [name], MD;  Location: [place];  Service: Ophthalmology;  Laterality: Left;  . 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 SALIVARY STONE REMOVAL Right 02/21/2019   Procedure: RIGHT PAROTID FISTULA REPAIR;  Surgeon: Leta Baptist, MD;  Location: [place];  Service: ENT;  Laterality: Right;  . SHOULDER SURGERY  2020   [facility]  . TOTAL KNEE ARTHROPLASTY Left 10/19/2012   Procedure: LEFT TOTAL KNEE ARTHROPLASTY;  Surgeon: Magnus Sinning, MD;  Location: [facility];  Service: Orthopedics;  Laterality: Left;  [name] VITRECTOMY 25 GAUGE WITH SCLERAL BUCKLE Left 02/17/2017   Procedure: VITRECTOMY 25 GAUGE WITH SCLERAL BUCKLE membrane peel, injection of silicone oil, and endolaser photocoagulation.;  Surgeon: [name], MD;  Location: [place];  Service: Ophthalmology;  Laterality: Left;       Family History  Problem Relation Age of Onset  . Diabetes Mother   . COPD Brother   . COPD Sister   . Heart attack Father   . Amblyopia Neg Hx   . Blindness Neg Hx   . Cataracts Neg Hx   . Glaucoma Neg Hx   . Macular degeneration Neg Hx   . Retinal detachment Neg Hx   . Retinitis pigmentosa Neg Hx     Social History   Tobacco Use  . Smoking  status: Former Smoker    Packs/day: 2.00    Years: 40.00    Pack years: 80.00    Types: Cigarettes, Pipe    Quit date: 06/09/1995    Years since quitting: 24.9  . Smokeless tobacco: Never Used  Vaping Use  . Vaping Use: Never used  Substance Use Topics  . Alcohol use: No  . Drug use: No    Home Medications Prior to Admission medications   Medication Sig Start Date End Date Taking? Authorizing Provider  allopurinol (ZYLOPRIM) 300 MG tablet Take 300 mg by mouth daily.   Yes [provider]  aspirin EC 81 MG tablet Take 81 mg by mouth daily.   Yes [provider]  b  complex vitamins tablet Take 1 tablet by mouth daily.   Yes [provider]  B Complex-C (B-COMPLEX WITH VITAMIN C) tablet Take 1 tablet by mouth daily.   Yes [provider]  cetirizine (ZYRTEC) 10 MG tablet Take 10 mg by mouth daily.   Yes [provider]  cholecalciferol (VITAMIN D3) 25 MCG (1000 UT) tablet Take 1,000 Units by mouth daily.   Yes [provider]  clobetasol cream (TEMOVATE) [id number] % Apply 1 application topically 2 (two) times daily.   Yes [provider]  Fluticasone-Umeclidin-Vilant (TRELEGY ELLIPTA) 200-62.5-25 MCG/INH AEPB Inhale 1 puff into the lungs daily. 03/12/20  Yes Mannam, Praveen, MD  hydrOXYzine (ATARAX/VISTARIL) 10 MG tablet Take 10 mg by mouth at bedtime.   Yes [provider]  ketorolac (ACULAR) 0.5 % ophthalmic solution Place 1 drop into the left eye 4 (four) times daily. 08/29/19 08/28/20 Yes [name], MD  metoprolol tartrate (LOPRESSOR) 50 MG tablet TAKE 1 TABLET 2 HOURS PRIOR TO CT 01/04/20  Yes Skeet Latch, MD  Multiple Vitamin (ONE-A-DAY MENS PO) Take 1 tablet by mouth daily.   Yes [provider]  pantoprazole (PROTONIX) 40 MG tablet Take 1 tablet (40 mg total) by mouth daily. [id number]  Yes Delora Fuel, MD  prednisoLONE acetate (PRED FORTE) 1 % ophthalmic suspension Place 1 drop into the left eye 4  (four) times daily. 04/05/20  Yes [name], MD  riluzole (RILUTEK) 50 MG tablet Take 1 tablet (50 mg total) by mouth every 12 (twelve) hours. 07/06/19  Yes Marcial Pacas, MD  prednisoLONE acetate (PRED FORTE) 1 % ophthalmic suspension Place 1 drop into the left eye 4 (four) times daily. Patient not taking: No sig reported 08/29/19   [name], MD  sodium chloride (MURO 128) 5 % ophthalmic solution Place 1 drop into the left eye in the morning, at noon, in the evening, and at bedtime. Patient not taking: No sig reported 03/10/20   [name], MD    Allergies    Lipitor [atorvastatin]  Review of Systems   Review of Systems  Constitutional: Negative for chills, diaphoresis, fatigue and fever.  HENT: Negative for congestion, sore throat and trouble swallowing.   Eyes: Negative for pain and visual disturbance.  Respiratory: Negative for cough, shortness of breath and wheezing.   Cardiovascular: Negative for chest pain, palpitations and leg swelling.  Gastrointestinal: Positive for abdominal pain. Negative for abdominal distention, diarrhea, nausea and vomiting.  Genitourinary: Negative for difficulty urinating.  Musculoskeletal: Negative for back pain, neck pain and neck stiffness.  Skin: Negative for pallor.  Neurological: Negative for dizziness, speech difficulty, weakness and headaches.  Psychiatric/Behavioral: Negative for confusion.    Physical Exam Updated Vital Signs BP 140/73   Pulse 65   Temp 98.3 F (36.8 C) (Oral)   Resp (!) 23   Ht 6\' 1"  (1.854 m)   Wt 81.6 kg   SpO2 97%   BMI 23.75 kg/m   Physical Exam Constitutional:      General: He is not in acute distress.    Appearance: Normal appearance. He is not ill-appearing, toxic-appearing or diaphoretic.     Comments: No respiratory distress, satting at 95% on 2 L.  HENT:     Mouth/Throat:     Mouth: Mucous membranes are moist.     Pharynx: Oropharynx is clear.  Eyes:     General: No scleral icterus.     Extraocular Movements: Extraocular movements intact.     Pupils: Pupils are equal,  round, and reactive to light.  Cardiovascular:     Rate and Rhythm: Normal rate and regular rhythm.     Pulses: Normal pulses.     Heart sounds: Normal heart sounds.  Pulmonary:     Effort: Pulmonary effort is normal. No respiratory distress.     Breath sounds: Normal breath sounds. No stridor. No wheezing, rhonchi or rales.  Chest:     Chest wall: No tenderness.  Abdominal:     General: Abdomen is flat. Bowel sounds are normal. There is no distension.     Palpations: Abdomen is soft.     Tenderness: There is abdominal tenderness in the suprapubic area. There is no guarding or rebound.     Hernia: No hernia is present.     Comments: Patient with very minimal tenderness to suprapubic region, no guarding.  No ecchymosis or erythema.  Musculoskeletal:        General: No swelling or tenderness. Normal range of motion.     Cervical back: Normal range of motion and neck supple. No rigidity.     Right lower leg: No edema.     Left lower leg: No edema.     Comments: Patient unable to lift bilateral upper extremities, states that this is due to his ALS which is normal for him.  He can wiggle his fingers, good cap refill.  Bilateral lower extremities with normal strength and sensation.  Skin:    General: Skin is warm and dry.     Capillary Refill: Capillary refill takes less than 2 seconds.     Coloration: Skin is not pale.  Neurological:     General: No focal deficit present.     Mental Status: He is alert and oriented to person, place, and time.  Psychiatric:        Mood and Affect: Mood normal.        Behavior: Behavior normal.     ED Results / Procedures / Treatments   Labs (all labs ordered are listed, but only abnormal results are displayed) Labs Reviewed  URINALYSIS, ROUTINE W REFLEX MICROSCOPIC - Abnormal; Notable for the following components:      Result Value   APPearance HAZY (*)    Hgb  urine dipstick LARGE (*)    Protein, ur TRACE (*)    Leukocytes,Ua SMALL (*)    All other components within normal limits  CBC WITH DIFFERENTIAL/PLATELET - Abnormal; Notable for the following components:   Hemoglobin 12.2 (*)    RDW 17.2 (*)    Neutro Abs 8.6 (*)    All other components within normal limits  D-DIMER, QUANTITATIVE (NOT AT [facility]) - Abnormal; Notable for the following components:   D-Dimer, Quant 1.85 (*)    All other components within normal limits  COMPREHENSIVE METABOLIC PANEL - Abnormal; Notable for the following components:   Glucose, Bld 122 (*)    Albumin 3.4 (*)    All other components within normal limits  URINALYSIS, MICROSCOPIC (REFLEX) - Abnormal; Notable for the following components:   Bacteria, UA FEW (*)    All other components within normal limits  URINE CULTURE  RESP PANEL BY RT-PCR (FLU A&B, COVID) ARPGX2  LIPASE, BLOOD  BRAIN NATRIURETIC PEPTIDE  CBC WITH DIFFERENTIAL/PLATELET    EKG EKG Interpretation  Date/Time:  Wednesday May 29 2020 15:16:54 EST Ventricular Rate:  84 PR Interval:    QRS Duration: 112 QT Interval:  394 QTC Calculation: 466 R Axis:   78 Text Interpretation: Sinus rhythm Ventricular  premature complex Prolonged PR interval Incomplete right bundle branch block Low voltage, precordial leads Borderline ST elevation, lateral leads since last tracing no significant change Confirmed by Daleen Bo [phone]) on 05/29/2020 3:24:40 PM   Radiology CT Angio Chest PE W/Cm &/Or Wo Cm  Result Date: 05/29/2020 CLINICAL DATA:  Shortness of breath and elevated D-dimer. Clinical suspicion for pulmonary embolism. EXAM: CT ANGIOGRAPHY CHEST WITH CONTRAST TECHNIQUE: Multidetector CT imaging of the chest was performed using the standard protocol during bolus administration of intravenous contrast. Multiplanar CT image reconstructions and MIPs were obtained to evaluate the vascular anatomy. CONTRAST:  70mL OMNIPAQUE IOHEXOL 350 MG/ML SOLN  COMPARISON:  02/16/2020 FINDINGS: Cardiovascular: 2 small filling defects are seen within subsegmental pulmonary artery branches in the anterior right middle lobe (image 178/6), and in the posterior right lower lobe (image 195/6). These are new since previous study of 02/16/2020, and may be represent acute or subacute pulmonary emboli. No other sites of pulmonary embolism identified. No evidence of thoracic aortic dissection or aneurysm. Mediastinum/Nodes: Stable mild mediastinal and hilar lymphadenopathy with central calcifications, consistent with old granulomatous disease. No new or increased sites of lymphadenopathy identified. Lungs/Pleura: Moderate to severe centrilobular emphysema is seen. New focal ill-defined pulmonary opacity is seen in the anterior right lung apex, likely infectious or inflammatory etiology with neoplasm considered less likely. Dependent atelectasis in both lower lobes has increased since prior exam. No evidence of pleural effusion. Upper abdomen: No acute findings. Musculoskeletal: No suspicious bone lesions identified. Review of the MIP images confirms the above findings. IMPRESSION: 2 small subsegmental pulmonary emboli in right middle and lower lobes which are new since 02/16/2020 exam, and may be acute or subacute in age. New focal ill-defined pulmonary opacity in anterior right lung apex, likely infectious or inflammatory etiology with neoplasm considered less likely. Recommend continued follow-up by CT in 2-3 months. Increased dependent atelectasis in both lower lobes. Stable mild mediastinal and hilar lymphadenopathy, consistent with old granulomatous disease. Emphysema (ICD10-J43.9). Electronically Signed   By: Marlaine Hind M.D.   On: 05/29/2020 20:49   CT Abdomen Pelvis W Contrast  Result Date: 05/29/2020 CLINICAL DATA:  Abdominal pain. Suprapubic abdominal pain. History of prior nephrectomy. EXAM: CT ABDOMEN AND PELVIS WITH CONTRAST TECHNIQUE: Multidetector CT imaging of  the abdomen and pelvis was performed using the standard protocol following bolus administration of intravenous contrast. CONTRAST:  175mL OMNIPAQUE IOHEXOL 300 MG/ML  SOLN COMPARISON:  CT dated 07/14/2012 FINDINGS: Lower chest: There is atelectasis at the lung bases.The heart size appears to be mildly enlarged. There is suggestion of emphysematous changes at the lung bases. Hepatobiliary: The liver is normal. Normal gallbladder.There is no biliary ductal dilation. Pancreas: Normal contours without ductal dilatation. No peripancreatic fluid collection. Spleen: Unremarkable. Adrenals/Urinary Tract: --Adrenal glands: Unremarkable. --Right kidney/ureter: The right adrenal gland is surgically absent. --Left kidney/ureter: There is left-sided mild to moderate hydroureteronephrosis secondary to an obstructing set of stones in the proximal left ureter. The largest stone measures up to approximately 7 mm (axial series 2, image 46). There are additional punctate nonobstructing stones throughout the left kidney. There are multiple small cystic appearing lesions involving the left kidney some of these measure above water density and therefore are indeterminate on this study. --Urinary bladder: Multiple small bladder stones/gravel are noted in the patient's urinary bladder. The urinary bladder is mildly distended. Stomach/Bowel: --Stomach/Duodenum: No hiatal hernia or other gastric abnormality. Normal duodenal course and caliber. --Small bowel: Unremarkable. --Colon: Rectosigmoid diverticulosis without acute inflammation. --Appendix: Not visualized. No  right lower quadrant inflammation or free fluid. Vascular/Lymphatic: Atherosclerotic calcification is present within the non-aneurysmal abdominal aorta, without hemodynamically significant stenosis. --No retroperitoneal lymphadenopathy. --No mesenteric lymphadenopathy. --No pelvic or inguinal lymphadenopathy. Reproductive: There is a penile prosthesis in place. Other: No ascites  or free air. The abdominal wall is normal. Musculoskeletal. No acute displaced fractures. IMPRESSION: 1. Left-sided mild to moderate hydroureteronephrosis secondary to an obstructing set of stones in the proximal left ureter. The largest stone measures up to approximately 7 mm. 2. Multiple small bladder stones/gravel are noted. 3. Rectosigmoid diverticulosis without acute inflammation. 4. There are multiple small cystic appearing lesions involving the left kidney some of these measure above water density and therefore are indeterminate on this study. Follow-up with a nonemergent outpatient renal ultrasound is recommended. 5. Status post right-sided nephrectomy. Aortic Atherosclerosis (ICD10-I70.0). Electronically Signed   By: Constance Holster M.D.   On: 05/29/2020 19:22   DG Chest Portable 1 View  Result Date: 05/29/2020 CLINICAL DATA:  New oxygen requirement. EXAM: PORTABLE CHEST 1 VIEW COMPARISON:  Chest radiographs 02/13/2020 and CTA 02/16/2020 FINDINGS: Telemetry leads overlie the chest. The cardiomediastinal silhouette is unchanged with normal heart size. Aortic atherosclerosis is noted. Lung volumes are lower than on the prior study. Chronic interstitial coarsening is similar to the prior radiographs with emphysema shown on CT. There may be a trace left pleural effusion. No pneumothorax is identified. No acute osseous abnormality is seen. IMPRESSION: Chronic lung changes without evidence of pneumonia or edema. Possible trace left pleural effusion. Electronically Signed   By: Logan Bores M.D.   On: 05/29/2020 15:18    Procedures .Critical Care Performed by: Alfredia Client, PA-C Authorized by: Alfredia Client, PA-C   Critical care provider statement:    Critical care time (minutes):  45   Critical care was time spent personally by me on the following activities:  Discussions with consultants, evaluation of patient's response to treatment, examination of patient, ordering and performing treatments  and interventions, ordering and review of laboratory studies, ordering and review of radiographic studies, pulse oximetry, re-evaluation of patient's condition, obtaining history from patient or surrogate and review of old charts   (including critical care time)  Medications Ordered in ED Medications  iohexol (OMNIPAQUE) 350 MG/ML injection 100 mL (has no administration in time range)  iohexol (OMNIPAQUE) 300 MG/ML solution 100 mL (100 mLs Intravenous Contrast Given 05/29/20 1842)  iohexol (OMNIPAQUE) 350 MG/ML injection 100 mL (75 mLs Intravenous Contrast Given 05/29/20 2010)    ED Course  I have reviewed the triage vital signs and the nursing notes.  Pertinent labs & imaging results that were available during my care of the patient were reviewed by me and considered in my medical decision making (see chart for details).    MDM Rules/Calculators/A&P                         AMBERS IYENGAR is a 79 y.o. male with past medical history of ALS, anemia, COPD, CAD presents emergency department today for abdominal pain.  Patient with suprapubic pain, currently states that he is not in any pain.  Work-up to include CT and basic labs.  Patient also requiring 2 L of oxygen which is new for him, will obtain D-dimer, BNP and chest x-ray at this time.  Work-up today reveals elevated D-dimer at 1.85.  CT abdomen does show moderate hydroureteronephrosis with multiple stones, the largest one being 7 mm.  Since patient only has 1 kidney,  I think patient will benefit from admission at this time, will call urology.  Spoke to Dr. Matilde Sprang, urology who states that patient needs to be admitted, states that patient will need stent tonight.  Will make patient n.p.o.  CT PE study does show 2 small subsegmental pulmonary emboli in the right middle and lower lobes which are new, unsure if they are acute or subacute in age.  No right heart strain.  Will hold off on anticoagulants at this time since patient is being  operated on tonight.  Spoke to hospitalist, Dr. Olevia Bowens who will accept the patient. Did message Dr. Matilde Sprang about updated CT results.  Upon reevaluation patient is still not complaining of any pain, still satting at 97% on 2 L.  The patient appears reasonably stabilized for admission considering the current resources, flow, and capabilities available in the ED at this time, and I doubt any other [facility] requiring further screening and/or treatment in the ED prior to admission.   I discussed this case with my attending physician who cosigned this note including patient's presenting symptoms, physical exam, and planned diagnostics and interventions. Attending physician stated agreement with plan or made changes to plan which were implemented.   Attending physician assessed patient at bedside.  Final Clinical Impression(s) / ED Diagnoses Final diagnoses:  Hydronephrosis with urinary obstruction due to renal calculus  Multiple subsegmental pulmonary emboli without acute cor pulmonale [facility])    Rx / DC Orders ED Discharge Orders    None       Alfredia Client, PA-C 05/29/20 2125    Milton [name], MD 05/31/20 1513

## 2020-05-30 ENCOUNTER — Other Ambulatory Visit: Payer: Self-pay | Admitting: Urology

## 2020-05-30 ENCOUNTER — Inpatient Hospital Stay (HOSPITAL_COMMUNITY): Payer: Medicare HMO | Admitting: Certified Registered Nurse Anesthetist

## 2020-05-30 ENCOUNTER — Inpatient Hospital Stay (HOSPITAL_COMMUNITY): Payer: Medicare HMO

## 2020-05-30 ENCOUNTER — Observation Stay (HOSPITAL_COMMUNITY): Payer: Medicare HMO

## 2020-05-30 ENCOUNTER — Encounter (HOSPITAL_COMMUNITY)
Admission: EM | Disposition: A | Discharge status: Home / Self Care | Payer: Self-pay | Source: Home / Self Care | Attending: Internal Medicine

## 2020-05-30 ENCOUNTER — Encounter (HOSPITAL_COMMUNITY): Payer: Self-pay | Admitting: Internal Medicine

## 2020-05-30 DIAGNOSIS — Z8719 Personal history of other diseases of the digestive system: Secondary | ICD-10-CM | POA: Diagnosis not present

## 2020-05-30 DIAGNOSIS — K219 Gastro-esophageal reflux disease without esophagitis: Secondary | ICD-10-CM | POA: Diagnosis present

## 2020-05-30 DIAGNOSIS — N179 Acute kidney failure, unspecified: Secondary | ICD-10-CM | POA: Diagnosis present

## 2020-05-30 DIAGNOSIS — N132 Hydronephrosis with renal and ureteral calculous obstruction: Secondary | ICD-10-CM | POA: Diagnosis not present

## 2020-05-30 DIAGNOSIS — R109 Unspecified abdominal pain: Secondary | ICD-10-CM | POA: Diagnosis present

## 2020-05-30 DIAGNOSIS — I2510 Atherosclerotic heart disease of native coronary artery without angina pectoris: Secondary | ICD-10-CM | POA: Diagnosis not present

## 2020-05-30 DIAGNOSIS — Z87442 Personal history of urinary calculi: Secondary | ICD-10-CM | POA: Diagnosis not present

## 2020-05-30 DIAGNOSIS — Z825 Family history of asthma and other chronic lower respiratory diseases: Secondary | ICD-10-CM | POA: Diagnosis not present

## 2020-05-30 DIAGNOSIS — Z87891 Personal history of nicotine dependence: Secondary | ICD-10-CM | POA: Diagnosis not present

## 2020-05-30 DIAGNOSIS — Z9841 Cataract extraction status, right eye: Secondary | ICD-10-CM | POA: Diagnosis not present

## 2020-05-30 DIAGNOSIS — R310 Gross hematuria: Secondary | ICD-10-CM | POA: Diagnosis not present

## 2020-05-30 DIAGNOSIS — I2699 Other pulmonary embolism without acute cor pulmonale: Secondary | ICD-10-CM | POA: Diagnosis not present

## 2020-05-30 DIAGNOSIS — N1330 Unspecified hydronephrosis: Secondary | ICD-10-CM | POA: Diagnosis not present

## 2020-05-30 DIAGNOSIS — I2694 Multiple subsegmental pulmonary emboli without acute cor pulmonale: Secondary | ICD-10-CM

## 2020-05-30 DIAGNOSIS — N281 Cyst of kidney, acquired: Secondary | ICD-10-CM | POA: Diagnosis not present

## 2020-05-30 DIAGNOSIS — Z20822 Contact with and (suspected) exposure to covid-19: Secondary | ICD-10-CM | POA: Diagnosis not present

## 2020-05-30 DIAGNOSIS — Z9842 Cataract extraction status, left eye: Secondary | ICD-10-CM | POA: Diagnosis not present

## 2020-05-30 DIAGNOSIS — D509 Iron deficiency anemia, unspecified: Secondary | ICD-10-CM | POA: Diagnosis present

## 2020-05-30 DIAGNOSIS — I2584 Coronary atherosclerosis due to calcified coronary lesion: Secondary | ICD-10-CM | POA: Diagnosis not present

## 2020-05-30 DIAGNOSIS — Z905 Acquired absence of kidney: Secondary | ICD-10-CM | POA: Diagnosis not present

## 2020-05-30 DIAGNOSIS — Z888 Allergy status to other drugs, medicaments and biological substances status: Secondary | ICD-10-CM | POA: Diagnosis not present

## 2020-05-30 DIAGNOSIS — Z85528 Personal history of other malignant neoplasm of kidney: Secondary | ICD-10-CM | POA: Diagnosis not present

## 2020-05-30 DIAGNOSIS — E441 Mild protein-calorie malnutrition: Secondary | ICD-10-CM | POA: Diagnosis not present

## 2020-05-30 DIAGNOSIS — J449 Chronic obstructive pulmonary disease, unspecified: Secondary | ICD-10-CM | POA: Diagnosis not present

## 2020-05-30 DIAGNOSIS — G1221 Amyotrophic lateral sclerosis: Secondary | ICD-10-CM

## 2020-05-30 DIAGNOSIS — K5900 Constipation, unspecified: Secondary | ICD-10-CM | POA: Diagnosis present

## 2020-05-30 DIAGNOSIS — E785 Hyperlipidemia, unspecified: Secondary | ICD-10-CM | POA: Diagnosis not present

## 2020-05-30 DIAGNOSIS — J9601 Acute respiratory failure with hypoxia: Secondary | ICD-10-CM | POA: Diagnosis not present

## 2020-05-30 DIAGNOSIS — E7800 Pure hypercholesterolemia, unspecified: Secondary | ICD-10-CM | POA: Diagnosis not present

## 2020-05-30 DIAGNOSIS — M1990 Unspecified osteoarthritis, unspecified site: Secondary | ICD-10-CM | POA: Diagnosis present

## 2020-05-30 DIAGNOSIS — Z96652 Presence of left artificial knee joint: Secondary | ICD-10-CM | POA: Diagnosis present

## 2020-05-30 DIAGNOSIS — Z833 Family history of diabetes mellitus: Secondary | ICD-10-CM | POA: Diagnosis not present

## 2020-05-30 HISTORY — PX: CYSTOSCOPY W/ URETERAL STENT PLACEMENT: SHX1429

## 2020-05-30 LAB — ECHOCARDIOGRAM COMPLETE
Area-P 1/2: 1.76 cm2
Height: 73 in
S' Lateral: 2.7 cm
Weight: 2880 oz

## 2020-05-30 LAB — CBC
HCT: 40.2 % (ref 39.0–52.0)
Hemoglobin: 12.2 g/dL — ABNORMAL LOW (ref 13.0–17.0)
MCH: 27.4 pg (ref 26.0–34.0)
MCHC: 30.3 g/dL (ref 30.0–36.0)
MCV: 90.1 fL (ref 80.0–100.0)
Platelets: 211 10*3/uL (ref 150–400)
RBC: 4.46 MIL/uL (ref 4.22–5.81)
RDW: 17.6 % — ABNORMAL HIGH (ref 11.5–15.5)
WBC: 9.1 10*3/uL (ref 4.0–10.5)
nRBC: 0 % (ref 0.0–0.2)

## 2020-05-30 LAB — BASIC METABOLIC PANEL
Anion gap: 10 (ref 5–15)
BUN: 19 mg/dL (ref 8–23)
CO2: 23 mmol/L (ref 22–32)
Calcium: 9.3 mg/dL (ref 8.9–10.3)
Chloride: 106 mmol/L (ref 98–111)
Creatinine, Ser: 0.95 mg/dL (ref 0.61–1.24)
GFR, Estimated: >60 mL/min (ref >60)
Glucose, Bld: 104 mg/dL — ABNORMAL HIGH (ref 70–99)
Potassium: 3.6 mmol/L (ref 3.5–5.1)
Sodium: 139 mmol/L (ref 135–145)

## 2020-05-30 LAB — SURGICAL PCR SCREEN
MRSA, PCR: NEGATIVE
Staphylococcus aureus: NEGATIVE

## 2020-05-30 LAB — HEPARIN LEVEL (UNFRACTIONATED)
Heparin Unfractionated: 0.45 IU/mL (ref 0.30–0.70)
Heparin Unfractionated: 0.35 IU/mL (ref 0.30–0.70)

## 2020-05-30 SURGERY — CYSTOSCOPY, WITH RETROGRADE PYELOGRAM AND URETERAL STENT INSERTION
Anesthesia: General | Laterality: Left

## 2020-05-30 MED ORDER — ONDANSETRON HCL 4 MG/2ML IJ SOLN
INTRAMUSCULAR | Status: AC
  Filled 2020-05-30: qty 2

## 2020-05-30 MED ORDER — PROPOFOL 500 MG/50ML IV EMUL
INTRAVENOUS | Status: DC | PRN
  Administered 2020-05-30: 70 mg via INTRAVENOUS
  Administered 2020-05-30: 50 mg via INTRAVENOUS

## 2020-05-30 MED ORDER — SODIUM CHLORIDE 0.9 % IV SOLN
1.0000 g | INTRAVENOUS | Status: DC
  Administered 2020-05-30 – 2020-06-01 (×2): 1 g via INTRAVENOUS
  Filled 2020-05-30: qty 10
  Filled 2020-05-30: qty 1
  Filled 2020-05-30: qty 10

## 2020-05-30 MED ORDER — FLUTICASONE FUROATE-VILANTEROL 200-25 MCG/INH IN AEPB
1.0000 | INHALATION_SPRAY | Freq: Every day | RESPIRATORY_TRACT | Status: DC
  Administered 2020-05-31 – 2020-06-01 (×2): 1 via RESPIRATORY_TRACT
  Filled 2020-05-30: qty 28

## 2020-05-30 MED ORDER — STERILE WATER FOR IRRIGATION IR SOLN
Status: DC | PRN
  Administered 2020-05-30: 3000 mL

## 2020-05-30 MED ORDER — DEXAMETHASONE SODIUM PHOSPHATE 10 MG/ML IJ SOLN
INTRAMUSCULAR | Status: AC
  Filled 2020-05-30: qty 1

## 2020-05-30 MED ORDER — CEFAZOLIN SODIUM-DEXTROSE 2-3 GM-%(50ML) IV SOLR
INTRAVENOUS | Status: DC | PRN
  Administered 2020-05-30: 2 g via INTRAVENOUS

## 2020-05-30 MED ORDER — PROPOFOL 10 MG/ML IV BOLUS
INTRAVENOUS | Status: AC
  Filled 2020-05-30: qty 20

## 2020-05-30 MED ORDER — MUPIROCIN 2 % EX OINT
1.0000 "application " | TOPICAL_OINTMENT | Freq: Two times a day (BID) | CUTANEOUS | Status: DC
  Administered 2020-05-31 – 2020-06-01 (×3): 1 via NASAL

## 2020-05-30 MED ORDER — LACTATED RINGERS IV SOLN: Freq: Once | INTRAVENOUS | Status: AC

## 2020-05-30 MED ORDER — MUPIROCIN 2 % EX OINT
TOPICAL_OINTMENT | CUTANEOUS | Status: AC
  Administered 2020-05-30: 1
  Filled 2020-05-30: qty 22

## 2020-05-30 MED ORDER — CEFAZOLIN SODIUM-DEXTROSE 2-4 GM/100ML-% IV SOLN: 2.0000 g | INTRAVENOUS | Status: DC

## 2020-05-30 MED ORDER — UMECLIDINIUM BROMIDE 62.5 MCG/INH IN AEPB
1.0000 | INHALATION_SPRAY | Freq: Every day | RESPIRATORY_TRACT | Status: DC
  Administered 2020-05-31 – 2020-06-01 (×2): 1 via RESPIRATORY_TRACT
  Filled 2020-05-30: qty 7

## 2020-05-30 MED ORDER — CHLORHEXIDINE GLUCONATE 0.12 % MT SOLN
15.0000 mL | OROMUCOSAL | Status: AC
  Administered 2020-05-30: 15 mL via OROMUCOSAL

## 2020-05-30 MED ORDER — IOHEXOL 300 MG/ML  SOLN
INTRAMUSCULAR | Status: DC | PRN
  Administered 2020-05-30: 2 mL

## 2020-05-30 MED ORDER — FENTANYL CITRATE (PF) 100 MCG/2ML IJ SOLN
INTRAMUSCULAR | Status: DC | PRN
  Administered 2020-05-30: 100 ug via INTRAVENOUS

## 2020-05-30 MED ORDER — FENTANYL CITRATE (PF) 100 MCG/2ML IJ SOLN
INTRAMUSCULAR | Status: AC
  Filled 2020-05-30: qty 2

## 2020-05-30 MED ORDER — PHENYLEPHRINE 40 MCG/ML (10ML) SYRINGE FOR IV PUSH (FOR BLOOD PRESSURE SUPPORT)
PREFILLED_SYRINGE | INTRAVENOUS | Status: DC | PRN
  Administered 2020-05-30 (×2): 80 ug via INTRAVENOUS

## 2020-05-30 MED ORDER — LIDOCAINE HCL (PF) 2 % IJ SOLN
INTRAMUSCULAR | Status: AC
  Filled 2020-05-30: qty 5

## 2020-05-30 MED ORDER — FENTANYL CITRATE (PF) 100 MCG/2ML IJ SOLN
25.0000 ug | INTRAMUSCULAR | Status: DC | PRN
  Administered 2020-05-30: 25 ug via INTRAVENOUS

## 2020-05-30 MED ORDER — LACTATED RINGERS IV SOLN: INTRAVENOUS | Status: DC | PRN

## 2020-05-30 MED ORDER — CEFAZOLIN SODIUM-DEXTROSE 2-4 GM/100ML-% IV SOLN
INTRAVENOUS | Status: AC
  Filled 2020-05-30: qty 100

## 2020-05-30 MED ORDER — ONDANSETRON HCL 4 MG/2ML IJ SOLN
INTRAMUSCULAR | Status: DC | PRN
  Administered 2020-05-30: 4 mg via INTRAVENOUS

## 2020-05-30 MED ORDER — DEXAMETHASONE SODIUM PHOSPHATE 10 MG/ML IJ SOLN
INTRAMUSCULAR | Status: DC | PRN
  Administered 2020-05-30: 10 mg via INTRAVENOUS

## 2020-05-30 SURGICAL SUPPLY — 16 items
BAG URO CATCHER STRL LF (MISCELLANEOUS) ×2 IMPLANT
BULB IRRIG PATHFIND (MISCELLANEOUS) IMPLANT
CATH INTERMIT  6FR 70CM (CATHETERS) ×2 IMPLANT
CLOTH BEACON ORANGE TIMEOUT ST (SAFETY) ×2 IMPLANT
GLOVE SURG ENC TEXT LTX SZ7.5 (GLOVE) ×2 IMPLANT
GOWN STRL REUS W/TWL LRG LVL3 (GOWN DISPOSABLE) ×4 IMPLANT
GUIDEWIRE STR DUAL SENSOR (WIRE) ×2 IMPLANT
KIT TURNOVER KIT A (KITS) ×2 IMPLANT
MANIFOLD NEPTUNE II (INSTRUMENTS) ×2 IMPLANT
PACK CYSTO (CUSTOM PROCEDURE TRAY) ×2 IMPLANT
STENT URET 6FRX26 CONTOUR (STENTS) ×2 IMPLANT
SYR 20ML LL LF (SYRINGE) ×2 IMPLANT
SYR TOOMEY IRRIG 70ML (MISCELLANEOUS) ×2
SYRINGE TOOMEY IRRIG 70ML (MISCELLANEOUS) ×1 IMPLANT
TUBING CONNECTING 10 (TUBING) ×2 IMPLANT
TUBING UROLOGY SET (TUBING) ×2 IMPLANT

## 2020-05-30 NOTE — Progress Notes (Signed)
*  PRELIMINARY RESULTS* Echocardiogram 2D Echocardiogram has been performed.  Samuel Germany 05/30/2020, 9:26 AM

## 2020-05-30 NOTE — ED Notes (Signed)
Pt wife called to notify pt that she is waiting for him at [facility]. Pt made aware of this.

## 2020-05-30 NOTE — Anesthesia Preprocedure Evaluation (Addendum)
Anesthesia Evaluation  Patient identified by MRN, date of birth, ID band Patient awake    Reviewed: Allergy & Precautions, NPO status , Patient's Chart, lab work & pertinent test results, reviewed documented beta blocker date and time   Airway Mallampati: II  TM Distance: >3 FB Neck ROM: Full    Dental no notable dental hx. (+) Teeth Intact, Dental Advisory Given   Pulmonary COPD, former smoker,    Pulmonary exam normal breath sounds clear to auscultation       Cardiovascular + CAD  Normal cardiovascular exam Rhythm:Regular Rate:Normal  TTE 2021 1. Left ventricular ejection fraction, by estimation, is 60 to 65%. The  left ventricle has normal function. The left ventricle has no regional  wall motion abnormalities. Left ventricular diastolic parameters are  indeterminate.  2. Right ventricular systolic function is normal. The right ventricular  size is mildly enlarged. Tricuspid regurgitation signal is inadequate for  assessing PA pressure.  3. A small pericardial effusion is present. The pericardial effusion is  anterior to the right ventricle.  4. The mitral valve is grossly normal. Trivial mitral valve  regurgitation. Moderate mitral annular calcification.  5. The aortic valve is tricuspid. Aortic valve regurgitation is trivial.  Mild to moderate aortic valve sclerosis/calcification is present, without  any evidence of aortic stenosis.  6. The inferior vena cava is normal in size with <50% respiratory  variability, suggesting right atrial pressure of 8 mmHg.   Neuro/Psych  Neuromuscular disease (ALS, can walk, cannot move arms, no difficulty breathing) negative psych ROS   GI/Hepatic Neg liver ROS, GERD  Controlled and Medicated,  Endo/Other  negative endocrine ROS  Renal/GU RCC s/p right nephrectomy  negative genitourinary   Musculoskeletal  (+) Arthritis ,   Abdominal   Peds  Hematology negative  hematology ROS (+)   Anesthesia Other Findings   Reproductive/Obstetrics                          Anesthesia Physical Anesthesia Plan  ASA: III  Anesthesia Plan: General   Post-op Pain Management:    Induction: Intravenous  PONV Risk Score and Plan: 2 and Ondansetron, Dexamethasone and Treatment may vary due to age or medical condition  Airway Management Planned: LMA  Additional Equipment:   Intra-op Plan:   Post-operative Plan: Extubation in OR  Informed Consent: I have reviewed the patients History and Physical, chart, labs and discussed the procedure including the risks, benefits and alternatives for the proposed anesthesia with the patient or authorized representative who has indicated his/her understanding and acceptance.     Dental advisory given  Plan Discussed with: CRNA  Anesthesia Plan Comments:         Anesthesia Quick Evaluation

## 2020-05-30 NOTE — Consult Note (Signed)
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  Anemia     low iron  Arthritis    Atypical chest pain 01/04/2020  Colonic polyp    Constipation    COPD (chronic obstructive pulmonary disease) ([place])    Coronary artery calcification seen on CAT scan 01/04/2020  GERD (gastroesophageal reflux disease)    Gout    History of kidney stones    Malignant tumor of kidney ([place])    Muscle atrophy    Pain     LOWER BACK AND LEFT HIP - HX OF PREVIOUS LUMBAR AND CERVICAL SURGERY--PT STATES HE HAS HAD NUMBNESS IN BOTH FEET SINCE HIS BACK SURGERY  Pure hypercholesterolemia 01/04/2020  Renal calculus    Retinal detachment    Shortness of breath 01/04/2020   Past Surgical History: Procedure Laterality Date  BACK SURGERY       LUMBAR SURGERY  CATARACT EXTRACTION Bilateral 2003   Surgeon unknown  EYE SURGERY       BILATERAL CATARACT EXTRACTIONS  GIVENS CAPSULE STUDY N/A 10/07/2015   Procedure: GIVENS CAPSULE STUDY;  Surgeon: Carol Ada, MD;  Location[facility] ENDOSCOPY;  Service: Endoscopy;  Laterality: N/A;  HAND SURGERY       left  KNEE SURGERY       right  LAMINECTOMY       cervical  LASER PHOTO ABLATION Left 08/19/2017   Procedure: LASER PHOTO ABLATION;  Surgeon: [name], MD;  Location: [place];  Service: Ophthalmology;  Laterality: Left;  MEMBRANE PEEL Left 03/22/2017   Procedure: POSSIBLE MEMBRANE PEEL WITH SILICONE OIL AND PERFLURON;  Surgeon: [name], MD;  Location: [place];  Service:  Ophthalmology;  Laterality: Left;  NEPHRECTOMY       PT STATES RIGHT KIDNEY WAS REMOVED  PAROTIDECTOMY Right 06/13/2018   Procedure: RIGHT TOTAL PAROTIDECTOMY;  Surgeon: Leta Baptist, MD;  Location: [place];  Service: ENT;  Laterality: Right;  PARS PLANA VITRECTOMY Left 03/22/2017   Procedure: PARS PLANA VITRECTOMY WITH 25 GAUGE;  Surgeon: [name], MD;  Location: [place];  Service: Ophthalmology;  Laterality: Left;  PARS PLANA VITRECTOMY W/ SCLERAL BUCKLE Left 08/19/2017   Procedure: 82 GAUGE PARS PLANA VITRECTOMY WITH SILCONE OIL REMOVAL;  Surgeon: [name], MD;  Location: [place];  Service: Ophthalmology;  Laterality: Left;  PENILE PROSTHESIS IMPLANT   01/2011  SALIVARY STONE REMOVAL Right 02/21/2019   Procedure: RIGHT PAROTID FISTULA REPAIR;  Surgeon: Leta Baptist, MD;  Location: [place];  Service: ENT;  Laterality: Right;  SHOULDER SURGERY   2020   SCA  TOTAL KNEE ARTHROPLASTY Left 10/19/2012   Procedure: LEFT TOTAL KNEE ARTHROPLASTY;  Surgeon: Magnus Sinning, MD;  Location: [facility];  Service: Orthopedics;  Laterality: Left;  VITRECTOMY 25 GAUGE WITH SCLERAL BUCKLE Left 02/17/2017   Procedure: VITRECTOMY 25 GAUGE WITH SCLERAL BUCKLE membrane peel, injection of silicone oil, and endolaser photocoagulation.;  Surgeon: [name], MD;  Location: [place];  Service: Ophthalmology;  Laterality: Left;     Medications:  I have reviewed the patient's current medications. Allergies:  Allergies Allergen Reactions  Lipitor [Atorvastatin] Rash and Other (See Comments)     Passed out     Family History Problem Relation Age of Onset  Diabetes Mother    COPD Brother    COPD Sister    Heart attack Father    Amblyopia Neg Hx    Blindness Neg Hx    Cataracts Neg Hx    Glaucoma Neg Hx    Macular degeneration Neg Hx    Retinal detachment Neg Hx    Retinitis pigmentosa Neg Hx     Social History:  reports that he quit smoking about 24 years ago. His smoking use  included cigarettes and pipe. He has a 80.00 pack-year smoking history. He has never used smokeless tobacco. He reports that he does not drink alcohol and does not use drugs.   ROS: All systems are reviewed and negative except as noted. Rest negative   Physical Exam:  Vital signs in last 24 hours: Temp:  [98.3 F (36.8 C)] 98.3 F (36.8 C) (12/22 1419) Pulse Rate:  [65-88] 65 (12/22 2000) Resp:  [18-26] 23 (12/22 2000) BP: (128-140)/(73-85) 140/73 (12/22 2000) SpO2:  [90 %-97 %] 97 % (12/22 2000) Weight:  [81.6 kg] 81.6 kg (12/22 1420)   Cardiovascular: Skin warm; not flushed Respiratory: Breaths quiet; no shortness of breath Abdomen: No masses Neurological: Normal sensation to touch Musculoskeletal: Normal motor function arms and legs Lymphatics: No inguinal adenopathy Skin: No rashes Genitourinary:prostheses penis    Laboratory Data:  Lab Results Past 72 Hours Results for orders placed or performed during the hospital encounter of 05/29/20 (from the past 72 hour(s)) Urinalysis, Routine w reflex microscopic Urine, Clean Catch     Status: Abnormal   Collection Time: 05/29/20  2:21 PM Result Value Ref Range   Color, Urine YELLOW YELLOW   APPearance HAZY (A) CLEAR   Specific Gravity, Urine 1.015 1.005 - 1.030   pH 7.0 5.0 - 8.0   Glucose, UA NEGATIVE NEGATIVE mg/dL   Hgb urine dipstick LARGE (A) NEGATIVE   Bilirubin Urine NEGATIVE NEGATIVE   Ketones, ur NEGATIVE NEGATIVE mg/dL   Protein, ur TRACE (A) NEGATIVE mg/dL   Nitrite NEGATIVE NEGATIVE   Leukocytes,Ua SMALL (A) NEGATIVE     Comment: Performed at [facility], [address] [place], [place] [address] Urinalysis, Microscopic (reflex)     Status: Abnormal   Collection Time: 05/29/20  2:21 PM Result Value Ref Range   RBC / HPF >50 0 - 5 RBC/hpf   WBC, UA 0-5 0 - 5 WBC/hpf   Bacteria, UA FEW (A) NONE SEEN   Squamous Epithelial / LPF 0-5 0 - 5     Comment: Performed at [facility], [address] [place],  [place] [address] CBC with Differential/Platelet     Status: Abnormal   Collection Time: 05/29/20  4:40 PM Result Value Ref Range   WBC 10.0 4.0 - 10.5 K/uL   RBC 4.43 4.22 - 5.81 MIL/uL   Hemoglobin 12.2 (L) 13.0 - 17.0 g/dL   HCT 40.1 39.0 - 52.0 %   MCV 90.5 80.0 - 100.0 fL   MCH 27.5 26.0 - 34.0 pg   MCHC 30.4 30.0 - 36.0 g/dL   RDW 17.2 (H) 11.5 - 15.5 %   Platelets 202 150 - 400 K/uL   nRBC 0.0 0.0 - 0.2 %   Neutrophils Relative % 88 %   Neutro Abs 8.6 (H) 1.7 - 7.7  K/uL   Lymphocytes Relative 8 %   Lymphs Abs 0.8 0.7 - 4.0 K/uL   Monocytes Relative 4 %   Monocytes Absolute 0.4 0.1 - 1.0 K/uL   Eosinophils Relative 0 %   Eosinophils Absolute 0.0 0.0 - 0.5 K/uL   Basophils Relative 0 %   Basophils Absolute 0.0 0.0 - 0.1 K/uL   Immature Granulocytes 0 %   Abs Immature Granulocytes 0.04 0.00 - 0.07 K/uL     Comment: Performed at [facility], [address], [place], [place] [address] D-dimer, quantitative (not at [facility])     Status: Abnormal   Collection Time: 05/29/20  4:40 PM Result Value Ref Range   D-Dimer, Quant 1.85 (H) 0.00 - 0.50 ug/mL-FEU     Comment: (NOTE) At the manufacturer cut-off value of 0.5 g/mL FEU, this assay has a negative predictive value of 95-100%.This assay is intended for use in conjunction with a clinical pretest probability (PTP) assessment model to exclude pulmonary embolism (PE) and deep venous thrombosis (DVT) in outpatients suspected of PE or DVT. Results should be correlated with clinical presentation. Performed at [facility], [address], [place], [place] [address]   Comprehensive metabolic panel     Status: Abnormal   Collection Time: 05/29/20  4:40 PM Result Value Ref Range   Sodium 139 135 - 145 mmol/L   Potassium 4.4 3.5 - 5.1 mmol/L   Chloride 105 98 - 111 mmol/L   CO2 24 22 - 32 mmol/L   Glucose, Bld 122 (H) 70 - 99 mg/dL     Comment: Glucose reference range applies only to samples taken after fasting for at least 8 hours.    BUN 23 8 - 23 mg/dL   Creatinine, Ser 1.15 0.61 - 1.24 mg/dL   Calcium 9.8 8.9 - 10.3 mg/dL   Total Protein 6.6 6.5 - 8.1 g/dL   Albumin 3.4 (L) 3.5 - 5.0 g/dL   AST 29 15 - 41 U/L   ALT 25 0 - 44 U/L   Alkaline Phosphatase 67 38 - 126 U/L   Total Bilirubin 0.7 0.3 - 1.2 mg/dL   GFR, Estimated >60 >60 mL/min     Comment: (NOTE) Calculated using the CKD-EPI Creatinine Equation (2021)     Anion gap 10 5 - 15     Comment: Performed at [facility], [address], [place], [place] [address] Lipase, blood     Status: None   Collection Time: 05/29/20  4:40 PM Result Value Ref Range   Lipase 17 11 - 51 U/L     Comment: Performed at [facility], [address], [place], [place] [address] Brain natriuretic peptide     Status: None   Collection Time: 05/29/20  4:40 PM Result Value Ref Range   B Natriuretic Peptide 45.0 0.0 - 100.0 pg/mL     Comment: Performed at [facility], [address], [place], [place] [address]    No results found for this or any previous visit (from the past 240 hour(s)). Creatinine: Recent Labs   05/29/20 1640 CREATININE 1.15     Xrays: See report/chart noted   Impression/Assessment:  Left proximal ureter stones single left kidney; small bladder stones    Plan:  Picture drawn - stent; pros and cons and risks and failure and sequelae; irrigate bladder stones  Addendum: History as above ,plan for cysto and insertion of Left JJ stent later today.Risks/benefits discussed in detail. Patient agreeable to proceed

## 2020-05-30 NOTE — ED Notes (Addendum)
Pt is alert and resting. Heparin given to pt and PO meds held per MD. Wife left with pt belongings.

## 2020-05-30 NOTE — Transfer of Care (Signed)
Immediate Anesthesia Transfer of Care Note  Patient: Jerome Mays  Procedure(s) Performed: Procedure(s): CYSTOSCOPY URETERAL STENT PLACEMENT (Left)  Patient Location: PACU  Anesthesia Type:General  Level of Consciousness: Alert, Awake, Oriented  Airway & Oxygen Therapy: Patient Spontanous Breathing  Post-op Assessment: Report given to RN  Post vital signs: Reviewed and stable  Last Vitals:  Vitals:   05/30/20 1146 05/30/20 1531  BP: 137/88 (!) 148/88  Pulse: 72 76  Resp: 16 20  Temp: 36.8 C 37.1 C  SpO2: 10% 31%    Complications: No apparent anesthesia complications

## 2020-05-30 NOTE — ED Notes (Signed)
Wife informed of pt in process of being transferred to [facility] to room 1425.

## 2020-05-30 NOTE — Op Note (Signed)
Operative report  Preop diagnosis: Left ureteral calculus with obstruction of solitary left kidney Postop diagnosis: Left ureteral calculus with obstruction of solitary left kidney Procedure: Cystoscopy, insertion left JJ stent Surgeon: Milford Cage Anesthesia: General Estimated blood loss minimal Operative findings.  High-grade obstruction of left ureter from 7 mm left proximal ureteral calculus.  Retained contrast from.  Prior CT coning to the stone.  6 [place] by 26 cm soft contour stent placed without difficulty.  Brisk flow of clear urine noted. Operative note after obtaining informed consent for the patient is taken the major cystoscopy suite placed under general anesthesia.  Placed in the dorsolithotomy position genitalia prepped draped usual sterile fashion.  Proper pause and timeout was performed for site of procedures.  The 50 French scope was advanced into the bladder without difficulty.  Bladder markedly trabeculated the left ureteral orifice was identified and utilizing a 5 French open tip catheter a sensor wire was passed just inside the left ureteral orifice.  The sensor wire was subsequently passed up to the level of the stone which could be seen in the proximal ureter with contrast coming to the stone.  I advanced the tip of the open tip catheter up to the level of the stone and was able to manipulate this retrograde into the renal pelvis.  The guidewire was subsequently coiled in the renal pelvis and the operative catheter removed.  A 6 French by 26 cm left JJ stent was placed leaving a proximal coil in the renal pelvis and a distal coil in the bladder.  There was brisk flow of clear urine through the stent.  Bladder was emptied procedure was terminated.  He was awakened from anesthesia and taken back to the recovery room in stable condition.  No immediate complication from the procedure.

## 2020-05-30 NOTE — Progress Notes (Signed)
ANTICOAGULATION CONSULT NOTE - Initial Consult  Pharmacy Consult for heparin Indication: pulmonary embolus  Allergies  Allergen Reactions  . Lipitor [Atorvastatin] Rash and Other (See Comments)    Passed out    Patient Measurements: Height: 6\' 1"  (185.4 cm) Weight: 81.6 kg (180 lb) IBW/kg (Calculated) : 79.9  Vital Signs: Temp: 98.2 F (36.8 C) (12/23 0844) Temp Source: Oral (12/23 0700) BP: 144/81 (12/23 0830) Pulse Rate: 70 (12/23 0830)  Labs: Recent Labs    05/29/20 1640 05/30/20 0359 05/30/20 0826  HGB 12.2* 12.2*  --   HCT 40.1 40.2  --   PLT 202 211  --   HEPARINUNFRC  --   --  0.45  CREATININE 1.15 0.95  --     Estimated Creatinine Clearance: 70.1 mL/min (by C-G formula based on SCr of 0.95 mg/dL).   Medical History: Past Medical History:  Diagnosis Date  . ALS (amyotrophic lateral sclerosis) ([place]) 2021  . Anemia    low iron  . Arthritis   . Atypical chest pain 01/04/2020  . Colonic polyp   . Constipation   . COPD (chronic obstructive pulmonary disease) ([place])   . Coronary artery calcification seen on CAT scan 01/04/2020  . GERD (gastroesophageal reflux disease)   . Gout   . History of kidney stones   . Malignant tumor of kidney ([place])   . Muscle atrophy   . Pain    LOWER BACK AND LEFT HIP - HX OF PREVIOUS LUMBAR AND CERVICAL SURGERY--PT STATES HE HAS HAD NUMBNESS IN BOTH FEET SINCE HIS BACK SURGERY  . Pure hypercholesterolemia 01/04/2020  . Renal calculus   . Retinal detachment   . Shortness of breath 01/04/2020    Assessment: 79yo male w/ ALS c/o abdominal pain, found to have small bladder stones, requiring 2L O2 which is unusual for him >> D-dimer elevated >> CT reveals small PE, to begin heparin.  Goal of Therapy:  Heparin level 0.3-0.7 units/ml Monitor platelets by anticoagulation protocol: Yes   05/30/20 1000 update: HL: 0.45 IU/mL, within goal range on heparin at 1200 units/hr CBC: Hb 12.2 (stable)     Plates 202>211 RN reports no  bleeding or issues with infusion   Plan:  Continue heparin infusion at 1200 units/hr Obtain confirmatory heparin level at 1600 today Daily HL and CBC while on heparin Monitor for S/S of bleeding   Despina Pole, Pharm. D. Clinical Pharmacist 05/30/2020 10:20 AM

## 2020-05-30 NOTE — Progress Notes (Addendum)
PROGRESS NOTE   Jerome Mays  [id number] DOB: [birth year] DOA: 05/29/2020 PCP: [name], MD   Chief Complaint  Patient presents with  . Abdominal Pain    Brief Admission History:  79 y.o. male with medical history significant of ALS, iron deficiency anemia, osteoarthritis, atypical chest pain, colon polyps, constipation, COPD, CAD, GERD, gout, urolithiasis, RCC with right nephrectomy, hyperlipidemia, retinal detachment who is coming to the emergency department due to left flank pain since earlier in the morning today associated with nausea/one episode of emesis with minimal mucus content and recent decrease in appetite.  He has been mildly dyspneic.  He was found to be hypoxic in the mid 80s to low 90s and had to be placed on nasal cannula oxygen at 2 LPM.  He was admitted with pulmonary emboli and hydroureteronephrosis from kidney stone.  He is transferring to [facility] for urology coverage and planned cystoscopy.   Assessment & Plan:   Principal Problem:   Pulmonary emboli (HCC) Active Problems:   Gout   COPD, group B, by GOLD 2017 classification ([place])   ALS (amyotrophic lateral sclerosis) (HCC)   GERD (gastroesophageal reflux disease)   Hydroureteronephrosis   Mild protein malnutrition ([place])  1. Pulmonary emboli - Pt has been started on IV heparin infusion managed by pharmacy for full anticoagulation.  Continue other supportive measures.  2. Hydroureteronephrosis - Pt is admitted to [facility] for urology planned cystoscopy.  He was seen by Dr. Matilde Sprang in consultation.  3. COPD - no acute exacerbation at this time, resumed home bronchodilators.  4. GOUT - stable, continue allopurinol.  5. ALS - He remains on rilutek 50 mg every 12 hours and supportive measures.  He needs advance care planning and goals of care discussions.  He is full code at this time.  6. GERD - protonix ordered for GI protection.    DVT prophylaxis:  IV heparin for full anticoagulation  Code Status:  Full   Family Communication:  Disposition: transfer to [facility]   Status is: Observation  The patient remains OBS appropriate and will d/c before 2 midnights.  Dispo: The patient is from: Home              Anticipated d/c is to: Home              Anticipated d/c date is: 1 day              Patient currently is not medically stable to d/c.  Consultants:   Urology  Procedures:   Pending cystoscopy   Antimicrobials:  Ceftriaxone 12/22>>   Subjective: Pt reports that his back and leg pain.  Objective: Vitals:   05/30/20 0730 05/30/20 0800 05/30/20 0830 05/30/20 0844  BP: 128/77 (!) 126/91 (!) 144/81   Pulse: 73 63 70   Resp: (!) 21 19 (!) 24   Temp:    98.2 F (36.8 C)  TempSrc:      SpO2: 93% 94% 94%   Weight:      Height:        Intake/Output Summary (Last 24 hours) at 05/30/2020 0913 Last data filed at 05/30/2020 0408 Gross per 24 hour  Intake 100 ml  Output --  Net 100 ml   Filed Weights   05/29/20 1420  Weight: 81.6 kg   Examination:  General exam: chronically ill appearing male, Appears calm and comfortable  Respiratory system: no increased work of breathing.  Cardiovascular system: normal S1 & S2 heard. No JVD, murmurs, rubs, gallops or  clicks. No pedal edema. Gastrointestinal system: Abdomen is nondistended, soft and nontender. No organomegaly or masses felt. Normal bowel sounds heard. Central nervous system: Alert and oriented. No focal neurological deficits. Extremities: Symmetric 5 x 5 power. Skin: No rashes, lesions or ulcers Psychiatry: Judgement and insight appear normal. Mood & affect appropriate.   Data Reviewed: I have personally reviewed following labs and imaging studies  CBC: Recent Labs  Lab 05/29/20 1640 05/30/20 0359  WBC 10.0 9.1  NEUTROABS 8.6*  --   HGB 12.2* 12.2*  HCT 40.1 40.2  MCV 90.5 90.1  PLT 202 397    Basic Metabolic Panel: Recent Labs  Lab 05/29/20 1640 05/30/20 0359  NA 139 139  K 4.4 3.6  CL 105 106  CO2 24 23   GLUCOSE 122* 104*  BUN 23 19  CREATININE 1.15 0.95  CALCIUM 9.8 9.3    GFR: Estimated Creatinine Clearance: 70.1 mL/min (by C-G formula based on SCr of 0.95 mg/dL).  Liver Function Tests: Recent Labs  Lab 05/29/20 1640  AST 29  ALT 25  ALKPHOS 67  BILITOT 0.7  PROT 6.6  ALBUMIN 3.4*    CBG: No results for input(s): GLUCAP in the last 168 hours.  Recent Results (from the past 240 hour(s))  Resp Panel by RT-PCR (Flu A&B, Covid) Nasopharyngeal Swab     Status: None   Collection Time: 05/29/20  8:43 PM   Specimen: Nasopharyngeal Swab; Nasopharyngeal(NP) swabs in vial transport medium  Result Value Ref Range Status   SARS Coronavirus 2 by RT PCR NEGATIVE NEGATIVE Final    Comment: (NOTE) SARS-CoV-2 target nucleic acids are NOT DETECTED.  The SARS-CoV-2 RNA is generally detectable in upper respiratory specimens during the acute phase of infection. The lowest concentration of SARS-CoV-2 viral copies this assay can detect is 138 copies/mL. A negative result does not preclude SARS-Cov-2 infection and should not be used as the sole basis for treatment or other patient management decisions. A negative result may occur with  improper specimen collection/handling, submission of specimen other than nasopharyngeal swab, presence of viral mutation(s) within the areas targeted by this assay, and inadequate number of viral copies(<138 copies/mL). A negative result must be combined with clinical observations, patient history, and epidemiological information. The expected result is Negative.  Fact Sheet for Patients:  EntrepreneurPulse.com.au  Fact Sheet for Healthcare Providers:  IncredibleEmployment.be  This test is no t yet approved or cleared by the [place] FDA and  has been authorized for detection and/or diagnosis of SARS-CoV-2 by FDA under an Emergency Use Authorization (EUA). This EUA will remain  in effect (meaning this test can  be used) for the duration of the COVID-19 declaration under Section 564(b)(1) of the Act, 21 U.S.C.section 360bbb-3(b)(1), unless the authorization is terminated  or revoked sooner.       Influenza A by PCR NEGATIVE NEGATIVE Final   Influenza B by PCR NEGATIVE NEGATIVE Final    Comment: (NOTE) The Xpert Xpress SARS-CoV-2/FLU/RSV plus assay is intended as an aid in the diagnosis of influenza from Nasopharyngeal swab specimens and should not be used as a sole basis for treatment. Nasal washings and aspirates are unacceptable for Xpert Xpress SARS-CoV-2/FLU/RSV testing.  Fact Sheet for Patients: EntrepreneurPulse.com.au  Fact Sheet for Healthcare Providers: IncredibleEmployment.be  This test is not yet approved or cleared by the [place] FDA and has been authorized for detection and/or diagnosis of SARS-CoV-2 by FDA under an Emergency Use Authorization (EUA). This EUA will remain in effect (meaning this  test can be used) for the duration of the COVID-19 declaration under Section 564(b)(1) of the Act, 21 U.S.C. section 360bbb-3(b)(1), unless the authorization is terminated or revoked.  Performed at [facility], [address], [place], [place] [address]      Radiology Studies: CT Angio Chest PE W/Cm &/Or Wo Cm  Result Date: 05/29/2020 CLINICAL DATA:  Shortness of breath and elevated D-dimer. Clinical suspicion for pulmonary embolism. EXAM: CT ANGIOGRAPHY CHEST WITH CONTRAST TECHNIQUE: Multidetector CT imaging of the chest was performed using the standard protocol during bolus administration of intravenous contrast. Multiplanar CT image reconstructions and MIPs were obtained to evaluate the vascular anatomy. CONTRAST:  47mL OMNIPAQUE IOHEXOL 350 MG/ML SOLN COMPARISON:  02/16/2020 FINDINGS: Cardiovascular: 2 small filling defects are seen within subsegmental pulmonary artery branches in the anterior right middle lobe (image 178/6), and in the  posterior right lower lobe (image 195/6). These are new since previous study of 02/16/2020, and may be represent acute or subacute pulmonary emboli. No other sites of pulmonary embolism identified. No evidence of thoracic aortic dissection or aneurysm. Mediastinum/Nodes: Stable mild mediastinal and hilar lymphadenopathy with central calcifications, consistent with old granulomatous disease. No new or increased sites of lymphadenopathy identified. Lungs/Pleura: Moderate to severe centrilobular emphysema is seen. New focal ill-defined pulmonary opacity is seen in the anterior right lung apex, likely infectious or inflammatory etiology with neoplasm considered less likely. Dependent atelectasis in both lower lobes has increased since prior exam. No evidence of pleural effusion. Upper abdomen: No acute findings. Musculoskeletal: No suspicious bone lesions identified. Review of the MIP images confirms the above findings. IMPRESSION: 2 small subsegmental pulmonary emboli in right middle and lower lobes which are new since 02/16/2020 exam, and may be acute or subacute in age. New focal ill-defined pulmonary opacity in anterior right lung apex, likely infectious or inflammatory etiology with neoplasm considered less likely. Recommend continued follow-up by CT in 2-3 months. Increased dependent atelectasis in both lower lobes. Stable mild mediastinal and hilar lymphadenopathy, consistent with old granulomatous disease. Emphysema (ICD10-J43.9). Electronically Signed   By: Marlaine Hind M.D.   On: 05/29/2020 20:49   CT Abdomen Pelvis W Contrast  Result Date: 05/29/2020 CLINICAL DATA:  Abdominal pain. Suprapubic abdominal pain. History of prior nephrectomy. EXAM: CT ABDOMEN AND PELVIS WITH CONTRAST TECHNIQUE: Multidetector CT imaging of the abdomen and pelvis was performed using the standard protocol following bolus administration of intravenous contrast. CONTRAST:  173mL OMNIPAQUE IOHEXOL 300 MG/ML  SOLN COMPARISON:  CT  dated 07/14/2012 FINDINGS: Lower chest: There is atelectasis at the lung bases.The heart size appears to be mildly enlarged. There is suggestion of emphysematous changes at the lung bases. Hepatobiliary: The liver is normal. Normal gallbladder.There is no biliary ductal dilation. Pancreas: Normal contours without ductal dilatation. No peripancreatic fluid collection. Spleen: Unremarkable. Adrenals/Urinary Tract: --Adrenal glands: Unremarkable. --Right kidney/ureter: The right adrenal gland is surgically absent. --Left kidney/ureter: There is left-sided mild to moderate hydroureteronephrosis secondary to an obstructing set of stones in the proximal left ureter. The largest stone measures up to approximately 7 mm (axial series 2, image 46). There are additional punctate nonobstructing stones throughout the left kidney. There are multiple small cystic appearing lesions involving the left kidney some of these measure above water density and therefore are indeterminate on this study. --Urinary bladder: Multiple small bladder stones/gravel are noted in the patient's urinary bladder. The urinary bladder is mildly distended. Stomach/Bowel: --Stomach/Duodenum: No hiatal hernia or other gastric abnormality. Normal duodenal course and caliber. --Small bowel: Unremarkable. --  Colon: Rectosigmoid diverticulosis without acute inflammation. --Appendix: Not visualized. No right lower quadrant inflammation or free fluid. Vascular/Lymphatic: Atherosclerotic calcification is present within the non-aneurysmal abdominal aorta, without hemodynamically significant stenosis. --No retroperitoneal lymphadenopathy. --No mesenteric lymphadenopathy. --No pelvic or inguinal lymphadenopathy. Reproductive: There is a penile prosthesis in place. Other: No ascites or free air. The abdominal wall is normal. Musculoskeletal. No acute displaced fractures. IMPRESSION: 1. Left-sided mild to moderate hydroureteronephrosis secondary to an obstructing set of  stones in the proximal left ureter. The largest stone measures up to approximately 7 mm. 2. Multiple small bladder stones/gravel are noted. 3. Rectosigmoid diverticulosis without acute inflammation. 4. There are multiple small cystic appearing lesions involving the left kidney some of these measure above water density and therefore are indeterminate on this study. Follow-up with a nonemergent outpatient renal ultrasound is recommended. 5. Status post right-sided nephrectomy. Aortic Atherosclerosis (ICD10-I70.0). Electronically Signed   By: Constance Holster M.D.   On: 05/29/2020 19:22   DG Chest Portable 1 View  Result Date: 05/29/2020 CLINICAL DATA:  New oxygen requirement. EXAM: PORTABLE CHEST 1 VIEW COMPARISON:  Chest radiographs 02/13/2020 and CTA 02/16/2020 FINDINGS: Telemetry leads overlie the chest. The cardiomediastinal silhouette is unchanged with normal heart size. Aortic atherosclerosis is noted. Lung volumes are lower than on the prior study. Chronic interstitial coarsening is similar to the prior radiographs with emphysema shown on CT. There may be a trace left pleural effusion. No pneumothorax is identified. No acute osseous abnormality is seen. IMPRESSION: Chronic lung changes without evidence of pneumonia or edema. Possible trace left pleural effusion. Electronically Signed   By: Logan Bores M.D.   On: 05/29/2020 15:18   Scheduled Meds: . allopurinol  300 mg Oral Daily  . aspirin EC  81 mg Oral Daily  . clobetasol cream  1 application Topical BID  . Fluticasone-Umeclidin-Vilant  1 puff Inhalation Daily  . hydrOXYzine  10 mg Oral QHS  . ketorolac  1 drop Left Eye QID  . loratadine  10 mg Oral Daily  . pantoprazole  40 mg Oral Daily  . prednisoLONE acetate  1 drop Left Eye QID  . riluzole  50 mg Oral Q12H   Continuous Infusions: . cefTRIAXone (ROCEPHIN)  IV Stopped (05/30/20 0408)  . heparin 1,200 Units/hr (05/30/20 0009)    LOS: 0 days   Time spent: 35 mins  Tasnim Balentine  Wynetta Emery, MD How to contact the [facility] Attending or Consulting provider [place] or covering provider during after hours [place], for this patient?  1. Check the care team in [facility] and look for a) attending/consulting TR[facility] provider listed and b) the [facility] team listed 2. Log into [email or web address] and use [place]'s universal password to access. If you do not have the password, please contact the hospital operator. 3. Locate the [facility] provider you are looking for under [facility] and page to a number that you can be directly reached. 4. If you still have difficulty reaching the provider, please page the [facility] (Director on Call) for the Hospitalists listed on amion for assistance.  05/30/2020, 9:13 AM

## 2020-05-30 NOTE — Anesthesia Procedure Notes (Signed)
Procedure Name: LMA Insertion Date/Time: 05/30/2020 4:39 PM Performed by: Gerald Leitz, CRNA Pre-anesthesia Checklist: Patient identified, Patient being monitored, Timeout performed, Emergency Drugs available and Suction available Patient Re-evaluated:Patient Re-evaluated prior to induction Oxygen Delivery Method: Circle system utilized Preoxygenation: Pre-oxygenation with 100% oxygen Induction Type: IV induction Ventilation: Mask ventilation without difficulty LMA: LMA inserted and LMA with gastric port inserted LMA Size: 4.0 Tube type: Oral Number of attempts: 1 Placement Confirmation: positive ETCO2 and breath sounds checked- equal and bilateral Tube secured with: Tape Dental Injury: Teeth and Oropharynx as per pre-operative assessment

## 2020-05-30 NOTE — Progress Notes (Signed)
Pharmacy Brief Note - Anticoagulation Follow Up:  Pt is on heparin drip for PE. Pt underwent cystoscopy with insertion of left JJ Stent today. Anticoagulation was not stopped for procedure.  Assessment:  Confirmatory HL = 0.35 remains therapeutic on heparin infusion of 1200 units/hr  Confirmed with RN that heparin infusing at correct rate.   Pt having some hematuria post-procedure, confirmed with urology that some hematuria is to be expected. No other signs of bleeding reported. MD aware.  Goal: HL 0.3 - 0.7  Plan:   Continue heparin infusion at current rate of 1200 units/hr  CBC, HL with AM labs tomorrow  Monitor for worsening signs of bleeding  Lenis Noon, PharmD 05/30/20 9:25 PM

## 2020-05-31 ENCOUNTER — Inpatient Hospital Stay (HOSPITAL_COMMUNITY): Payer: Medicare HMO

## 2020-05-31 DIAGNOSIS — J9601 Acute respiratory failure with hypoxia: Secondary | ICD-10-CM

## 2020-05-31 DIAGNOSIS — Z905 Acquired absence of kidney: Secondary | ICD-10-CM

## 2020-05-31 DIAGNOSIS — I2699 Other pulmonary embolism without acute cor pulmonale: Secondary | ICD-10-CM

## 2020-05-31 DIAGNOSIS — N281 Cyst of kidney, acquired: Secondary | ICD-10-CM

## 2020-05-31 LAB — CBC
HCT: 41.5 % (ref 39.0–52.0)
Hemoglobin: 12.6 g/dL — ABNORMAL LOW (ref 13.0–17.0)
MCH: 26.9 pg (ref 26.0–34.0)
MCHC: 30.4 g/dL (ref 30.0–36.0)
MCV: 88.5 fL (ref 80.0–100.0)
Platelets: 197 10*3/uL (ref 150–400)
RBC: 4.69 MIL/uL (ref 4.22–5.81)
RDW: 17.3 % — ABNORMAL HIGH (ref 11.5–15.5)
WBC: 6.4 10*3/uL (ref 4.0–10.5)
nRBC: 0 % (ref 0.0–0.2)

## 2020-05-31 LAB — BASIC METABOLIC PANEL
Anion gap: 13 (ref 5–15)
BUN: 25 mg/dL — ABNORMAL HIGH (ref 8–23)
CO2: 22 mmol/L (ref 22–32)
Calcium: 9.6 mg/dL (ref 8.9–10.3)
Chloride: 104 mmol/L (ref 98–111)
Creatinine, Ser: 1.04 mg/dL (ref 0.61–1.24)
GFR, Estimated: >60 mL/min (ref >60)
Glucose, Bld: 150 mg/dL — ABNORMAL HIGH (ref 70–99)
Potassium: 4.4 mmol/L (ref 3.5–5.1)
Sodium: 139 mmol/L (ref 135–145)

## 2020-05-31 LAB — URINE CULTURE: Culture: <10000 — AB

## 2020-05-31 LAB — HEPARIN LEVEL (UNFRACTIONATED): Heparin Unfractionated: 0.37 IU/mL (ref 0.30–0.70)

## 2020-05-31 LAB — MAGNESIUM: Magnesium: 2.1 mg/dL (ref 1.7–2.4)

## 2020-05-31 MED ORDER — APIXABAN 5 MG PO TABS
10.0000 mg | ORAL_TABLET | Freq: Two times a day (BID) | ORAL | Status: DC
  Administered 2020-05-31 – 2020-06-01 (×3): 10 mg via ORAL
  Filled 2020-05-31 (×3): qty 2

## 2020-05-31 MED ORDER — ENSURE ENLIVE PO LIQD: 237.0000 mL | Freq: Two times a day (BID) | ORAL | Status: DC

## 2020-05-31 MED ORDER — APIXABAN 5 MG PO TABS: 5.0000 mg | ORAL_TABLET | Freq: Two times a day (BID) | ORAL | Status: DC

## 2020-05-31 NOTE — Discharge Instructions (Signed)
Information on my medicine - ELIQUIS (apixaban)  Why was Eliquis prescribed for you? Eliquis was prescribed to treat blood clots that may have been found in your lungs (pulmonary embolism) and to reduce the risk of them occurring again.  What do You need to know about Eliquis ? The starting dose is 10 mg (two 5 mg tablets) taken TWICE daily for the FIRST SEVEN (7) DAYS, then on 06/07/2020, the dose is reduced to ONE 5 mg tablet taken TWICE daily.  Eliquis may be taken with or without food.   Try to take the dose about the same time in the morning and in the evening. If you have difficulty swallowing the tablet whole please discuss with your pharmacist how to take the medication safely.  Take Eliquis exactly as prescribed and DO NOT stop taking Eliquis without talking to the doctor who prescribed the medication.  Stopping may increase your risk of developing a new blood clot.  Refill your prescription before you run out.  After discharge, you should have regular check-up appointments with your healthcare provider that is prescribing your Eliquis.    What do you do if you miss a dose? If a dose of ELIQUIS is not taken at the scheduled time, take it as soon as possible on the same day and twice-daily administration should be resumed. The dose should not be doubled to make up for a missed dose.  Important Safety Information A possible side effect of Eliquis is bleeding. You should call your healthcare provider right away if you experience any of the following: ? Bleeding from an injury or your nose that does not stop. ? Unusual colored urine (red or dark brown) or unusual colored stools (red or black). ? Unusual bruising for unknown reasons. ? A serious fall or if you hit your head (even if there is no bleeding).  Some medicines may interact with Eliquis and might increase your risk of bleeding or clotting while on Eliquis. To help avoid this, consult your healthcare provider or  pharmacist prior to using any new prescription or non-prescription medications, including herbals, vitamins, non-steroidal anti-inflammatory drugs (NSAIDs) and supplements.  This website has more information on Eliquis (apixaban): http://www.eliquis.com/eliquis/home

## 2020-05-31 NOTE — TOC Initial Note (Signed)
Transition of Care [facility]) - Initial/Assessment Note    Patient Details  Name: Jerome Mays MRN: [id number] Date of Birth: May 05, 1941  [facility] CM/SW Contact:    Ross Ludwig, LCSW Phone Number: 05/31/2020, 12:10 PM  Clinical Narrative:                  Patient is married and lives with his wife.  Patient plans to return back home and will need oxygen.  CSW spoke to [place] at [place] and they can provide oxygen for patient.  Oxygen will be delivered to the room today for potential discharge tomorrow if he is medically ready for discharge.  Expected Discharge Plan: Home/Self Care Barriers to Discharge: Continued Medical Work up   Patient Goals and CMS Choice Patient states their goals for this hospitalization and ongoing recovery are:: To return back home. CMS Medicare.gov Compare Post Acute Care list provided to:: Patient Choice offered to / list presented to : Patient  Expected Discharge Plan and Services Expected Discharge Plan: Home/Self Care In-house Referral: Clinical Social Work     Living arrangements for the past 2 months: Single Family Home                 DME Arranged: Oxygen DME Agency: Other - Comment Physicist, medical) Date DME Agency Contacted: 05/31/20 Time DME Agency Contacted: [id number] Representative spoke with at DME Agency: Brenton Grills            Prior Living Arrangements/Services Living arrangements for the past 2 months: [place] Lives with:: Spouse Patient language and need for interpreter reviewed:: Yes Do you feel safe going back to the place where you live?: Yes      Need for Family Participation in Patient Care: No (Comment) Care giver support system in place?: No (comment)   Criminal Activity/Legal Involvement Pertinent to Current Situation/Hospitalization: No - Comment as needed  Activities of Daily Living Home Assistive Devices/Equipment: Eyeglasses ADL Screening (condition at time of admission) Patient's cognitive  ability adequate to safely complete daily activities?: Yes Is the patient deaf or have difficulty hearing?: No Does the patient have difficulty seeing, even when wearing glasses/contacts?: 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) Does the patient have difficulty concentrating, remembering, or making decisions?: No Patient able to express need for assistance with ADLs?: Yes Does the patient have difficulty dressing or bathing?: Yes Independently performs ADLs?: No Communication: Independent Dressing (OT): Dependent Is this a change from baseline?: Pre-admission baseline Grooming: Dependent Is this a change from baseline?: Pre-admission baseline Feeding: Dependent Is this a change from baseline?: Pre-admission baseline Bathing: Dependent Is this a change from baseline?: Pre-admission baseline Toileting: Dependent Is this a change from baseline?: Pre-admission baseline In/Out Bed: Dependent Is this a change from baseline?: Pre-admission baseline Walks in Home: Dependent Is this a change from baseline?: Pre-admission baseline Does the patient have difficulty walking or climbing stairs?: Yes (secondary to ALS) Weakness of Legs: Both Weakness of Arms/Hands: Both  Permission Sought/Granted Permission sought to share information with : Family Supports Permission granted to share information with : Yes, Release of Information Signed  Share Information with NAME: Keeghan, Bialy [phone]  [phone]           Emotional Assessment Appearance:: Appears stated age   Affect (typically observed): Accepting,Appropriate,Calm Orientation: : Oriented to Self,Oriented to Place,Oriented to  Time Alcohol / Substance Use: Not Applicable Psych Involvement: No (comment)  Admission diagnosis:  Pulmonary emboli (HCC) [I26.99] Hydronephrosis with urinary obstruction due to renal calculus [N13.2]  Multiple subsegmental pulmonary emboli without acute cor pulmonale (HCC) [I26.94] AKI (acute kidney injury)  (HCC) [N17.9] Patient Active Problem List   Diagnosis Date Noted  . AKI (acute kidney injury) (HCC) 05/30/2020  . Pulmonary emboli (HCC) 05/29/2020  . Hydroureteronephrosis 05/29/2020  . Mild protein malnutrition (HCC) 05/29/2020  . GERD (gastroesophageal reflux disease)   . ALS (amyotrophic lateral sclerosis) (HCC) 02/13/2020  . Atypical chest pain 01/04/2020  . Coronary artery calcification seen on CAT scan 01/04/2020  . Pure hypercholesterolemia 01/04/2020  . Shortness of breath 01/04/2020  . 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 Weakness 05/15/2019  . Muscular atrophy 05/15/2019  . Fasciculation 05/15/2019  . Muscle weakness (generalized) 05/15/2019  . H/O parotidectomy 06/13/2018  . 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 COPD, group B, by GOLD 2017 classification (HCC) 02/02/2017  . Hemoptysis 06/18/2016  . Benign prostatic hyperplasia with lower urinary tract symptoms 02/07/2016  . Stiffness of left knee 11/08/2012  . Difficulty in walking(719.7) 11/08/2012  . Gout 03/04/2012  . Renal cell cancer (HCC) 01/27/2012  . 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 ENLARGEMENT OF LYMPH NODES 08/21/2009  . CLOS FRACTURE MID/PROXIMAL PHALANX/PHALANG HAND 07/31/2009  . 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 COPD with chronic bronchitis (HCC) 12/06/2007  . COLONIC POLYPS, HX OF 12/05/2007  . RENAL CALCULUS, HX OF 12/05/2007   PCP:  [name], MD Pharmacy:   [facility] [address], [place] [address] [place] [address] [place] [address] [place] [address] Phone: [phone] Fax: [phone]     Social Determinants of Health (SDOH) Interventions    Readmission Risk Interventions No flowsheet data found.

## 2020-05-31 NOTE — Progress Notes (Signed)
Initial Nutrition Assessment  DOCUMENTATION CODES:   Not applicable  INTERVENTION:  Ensure Enlive po BID, each supplement provides 350 kcal and 20 grams of protein   NUTRITION DIAGNOSIS:   Increased nutrient needs related to acute illness,chronic illness (hydroureteronephrosis s/p ureteral stent placement; COPD) as evidenced by estimated needs.    GOAL:   Patient will meet greater than or equal to 90% of their needs    MONITOR:   PO intake,Weight trends,Labs,I & O's,Supplement acceptance  REASON FOR ASSESSMENT:   Consult Assessment of nutrition requirement/status  ASSESSMENT:   79 year old male with history significant of ALS, iron deficiency anemia, osteoarthritis, atypical chest pain, colon polyps, constipation, COPD, CAD, GERD, gout, urolithiasis, RCC with right nephrectomy, HLD, retinal detachment admitted for pulmonary emboli after presenting with left flank pain associated with nausea and one episode of emesis as well as decreased appetite.  RD working remotely.  Patient transferred from [facility] for urology coverage and planned cytoscopy due to hydroureteronephrosis from kidney stone. He is POD 1 s/p cystoscopy with left ureteral stent placement  RD attempted to contact pt via phone, however no answer and unable to obtain nutrition history at this time. No documented meals since regular diet advancement. Suspect decreased po intake over the past couple of days secondary to reported nausea prior to admission and NPO for procedure. Will order Ensure supplement to aid with meeting needs.   Weight history reviewed, stable over the last 15 months.   I/Os: +156.8 ml since admit UOP: 650 ml x 24 hrs  Medications reviewed and include: Zyloprim, Protonix, Rilutek, Rocephin  Labs reviewed  Per notes: -outpatient urology follow-up to address stone -potential d/c home tomorrow   NUTRITION - FOCUSED PHYSICAL EXAM: Unable to complete at this time   Diet Order:   Diet  Order            Diet regular Room service appropriate? Yes; Fluid consistency: Thin  Diet effective now                 EDUCATION NEEDS:   No education needs have been identified at this time  Skin:  Skin Assessment: Reviewed RN Assessment  Last BM:  12/22  Height:   Ht Readings from Last 1 Encounters:  05/30/20 6\' 1"  (1.854 m)    Weight:   Wt Readings from Last 1 Encounters:  05/30/20 81.6 kg    BMI:  Body mass index is 23.73 kg/m.  Estimated Nutritional Needs:   Kcal:  2200-2400  Protein:  90-105  Fluid:  >/= 2L    Lajuan Lines, RD, LDN Clinical Nutrition After Hours/Weekend Pager # in [place]

## 2020-05-31 NOTE — Progress Notes (Signed)
Urology Inpatient Progress Report   Procedure(s): CYSTOSCOPY URETERAL STENT PLACEMENT  1 Day Post-Op   Intv/Subj: Patient has some gross hematuria overnight.    Principal Problem:   Pulmonary emboli (HCC) Active Problems:   Gout   COPD, group B, by GOLD 2017 classification ([place])   ALS (amyotrophic lateral sclerosis) (HCC)   GERD (gastroesophageal reflux disease)   Hydroureteronephrosis   Mild protein malnutrition (HCC)   AKI (acute kidney injury) ([place])  Current Facility-Administered Medications  Medication Dose Route Frequency Provider Last Rate Last Admin  . acetaminophen (TYLENOL) tablet 650 mg  650 mg Oral Q6H PRN Remi Haggard, MD       Or  . acetaminophen (TYLENOL) suppository 650 mg  650 mg Rectal Q6H PRN Remi Haggard, MD      . allopurinol (ZYLOPRIM) tablet 300 mg  300 mg Oral Daily Remi Haggard, MD      . aspirin EC tablet 81 mg  81 mg Oral Daily Harold Barban B, MD      . cefTRIAXone (ROCEPHIN) 1 g in sodium chloride 0.9 % 100 mL IVPB  1 g Intravenous Q24H Remi Haggard, MD   Stopped at 05/30/20 0408  . clobetasol cream (TEMOVATE) [id number] % 1 application  1 application Topical BID Remi Haggard, MD      . fluticasone furoate-vilanterol (BREO ELLIPTA) 200-25 MCG/INH 1 puff  1 puff Inhalation Daily Remi Haggard, MD       And  . umeclidinium bromide (INCRUSE ELLIPTA) 62.5 MCG/INH 1 puff  1 puff Inhalation Daily Harold Barban B, MD      . heparin ADULT infusion 100 units/mL (25000 units/229mL)  1,200 Units/hr Intravenous Continuous Remi Haggard, MD 12 mL/hr at 05/30/20 1501 1,200 Units/hr at 05/30/20 1501  . hydrOXYzine (ATARAX/VISTARIL) tablet 10 mg  10 mg Oral QHS Remi Haggard, MD   10 mg at 05/30/20 2222  . iohexol (OMNIPAQUE) 350 MG/ML injection 100 mL  100 mL Intravenous Once PRN Remi Haggard, MD      . ketorolac (ACULAR) 0.5 % ophthalmic solution 1 drop  1 drop Left Eye QID Remi Haggard, MD   1 drop at 05/30/20 1448  .  loratadine (CLARITIN) tablet 10 mg  10 mg Oral Daily Harold Barban B, MD      . morphine 2 MG/ML injection 2 mg  2 mg Intravenous Q2H PRN Remi Haggard, MD   2 mg at 05/30/20 1940  . mupirocin ointment (BACTROBAN) 2 % 1 application  1 application Nasal BID Remi Haggard, MD      . ondansetron [facility]) tablet 4 mg  4 mg Oral Q6H PRN Remi Haggard, MD       Or  . ondansetron [facility]) injection 4 mg  4 mg Intravenous Q6H PRN Remi Haggard, MD      . pantoprazole (PROTONIX) EC tablet 40 mg  40 mg Oral Daily Harold Barban B, MD      . prednisoLONE acetate (PRED FORTE) 1 % ophthalmic suspension 1 drop  1 drop Left Eye QID Remi Haggard, MD   1 drop at 05/30/20 2207  . riluzole (RILUTEK) tablet 50 mg  50 mg Oral Q12H Efraim Kaufmann, [place]         Objective: Vital: Vitals:   05/30/20 1855 05/30/20 1900 05/30/20 2121 05/31/20 0601  BP:  (!) 153/83 (!) 148/85 128/90  Pulse:  83 74 77  Resp:  18 20 18   Temp:  98.5 F (36.9 C) 98.1 F (36.7 C) 98 F (36.7 C)  TempSrc:  Oral Oral Oral  SpO2: (!) 77% 91% 92% 94%  Weight:      Height:       I/Os: I/O last 3 completed shifts: In: 806.8 [I.V.:656.8; IV Piggyback:150] Out: 650 [Urine:650]  Physical Exam:  General: Patient is in no apparent distress Lungs: Normal respiratory effort, chest expands symmetrically. GI: The abdomen is soft and nontender  Ext: lower extremities symmetric  Lab Results: Recent Labs    05/29/20 1640 05/30/20 0359 05/31/20 0445  WBC 10.0 9.1 6.4  HGB 12.2* 12.2* 12.6*  HCT 40.1 40.2 41.5   Recent Labs    05/29/20 1640 05/30/20 0359 05/31/20 0445  NA 139 139 139  K 4.4 3.6 4.4  CL 105 106 104  CO2 24 23 22   GLUCOSE 122* 104* 150*  BUN 23 19 25*  CREATININE 1.15 0.95 1.04  CALCIUM 9.8 9.3 9.6   No results for input(s): LABPT, INR in the last 72 hours. No results for input(s): LABURIN in the last 72 hours. Results for orders placed or performed during the hospital encounter  of 05/29/20  Resp Panel by RT-PCR (Flu A&B, Covid) Nasopharyngeal Swab     Status: None   Collection Time: 05/29/20  8:43 PM   Specimen: Nasopharyngeal Swab; Nasopharyngeal(NP) swabs in vial transport medium  Result Value Ref Range Status   SARS Coronavirus 2 by RT PCR NEGATIVE NEGATIVE Final    Comment: (NOTE) SARS-CoV-2 target nucleic acids are NOT DETECTED.  The SARS-CoV-2 RNA is generally detectable in upper respiratory specimens during the acute phase of infection. The lowest concentration of SARS-CoV-2 viral copies this assay can detect is 138 copies/mL. A negative result does not preclude SARS-Cov-2 infection and should not be used as the sole basis for treatment or other patient management decisions. A negative result may occur with  improper specimen collection/handling, submission of specimen other than nasopharyngeal swab, presence of viral mutation(s) within the areas targeted by this assay, and inadequate number of viral copies(<138 copies/mL). A negative result must be combined with clinical observations, patient history, and epidemiological information. The expected result is Negative.  Fact Sheet for Patients:  EntrepreneurPulse.com.au  Fact Sheet for Healthcare Providers:  IncredibleEmployment.be  This test is no t yet approved or cleared by the [place] FDA and  has been authorized for detection and/or diagnosis of SARS-CoV-2 by FDA under an Emergency Use Authorization (EUA). This EUA will remain  in effect (meaning this test can be used) for the duration of the COVID-19 declaration under Section 564(b)(1) of the Act, 21 U.S.C.section 360bbb-3(b)(1), unless the authorization is terminated  or revoked sooner.       Influenza A by PCR NEGATIVE NEGATIVE Final   Influenza B by PCR NEGATIVE NEGATIVE Final    Comment: (NOTE) The Xpert Xpress SARS-CoV-2/FLU/RSV plus assay is intended as an aid in the diagnosis of influenza  from Nasopharyngeal swab specimens and should not be used as a sole basis for treatment. Nasal washings and aspirates are unacceptable for Xpert Xpress SARS-CoV-2/FLU/RSV testing.  Fact Sheet for Patients: EntrepreneurPulse.com.au  Fact Sheet for Healthcare Providers: IncredibleEmployment.be  This test is not yet approved or cleared by the [place] FDA and has been authorized for detection and/or diagnosis of SARS-CoV-2 by FDA under an Emergency Use Authorization (EUA). This EUA will remain in effect (meaning this test can be used) for the duration of the COVID-19 declaration under Section 564(b)(1) of the  Act, 21 U.S.C. section 360bbb-3(b)(1), unless the authorization is terminated or revoked.  Performed at [facility], [address] [place], [place] [address]   Surgical PCR screen     Status: None   Collection Time: 05/30/20  3:02 PM   Specimen: Nasal Mucosa; Nasal Swab  Result Value Ref Range Status   MRSA, PCR NEGATIVE NEGATIVE Final   Staphylococcus aureus NEGATIVE NEGATIVE Final    Comment: (NOTE) The Xpert SA Assay (FDA approved for NASAL specimens in patients 67 years of age and older), is one component of a comprehensive surveillance program. It is not intended to diagnose infection nor to guide or monitor treatment. Performed at [facility], [place] [address], [place], [place] [address]     Studies/Results: CT Angio Chest PE W/Cm &/Or Wo Cm  Result Date: 05/29/2020 CLINICAL DATA:  Shortness of breath and elevated D-dimer. Clinical suspicion for pulmonary embolism. EXAM: CT ANGIOGRAPHY CHEST WITH CONTRAST TECHNIQUE: Multidetector CT imaging of the chest was performed using the standard protocol during bolus administration of intravenous contrast. Multiplanar CT image reconstructions and MIPs were obtained to evaluate the vascular anatomy. CONTRAST:  32mL OMNIPAQUE IOHEXOL 350 MG/ML SOLN COMPARISON:   02/16/2020 FINDINGS: Cardiovascular: 2 small filling defects are seen within subsegmental pulmonary artery branches in the anterior right middle lobe (image 178/6), and in the posterior right lower lobe (image 195/6). These are new since previous study of 02/16/2020, and may be represent acute or subacute pulmonary emboli. No other sites of pulmonary embolism identified. No evidence of thoracic aortic dissection or aneurysm. Mediastinum/Nodes: Stable mild mediastinal and hilar lymphadenopathy with central calcifications, consistent with old granulomatous disease. No new or increased sites of lymphadenopathy identified. Lungs/Pleura: Moderate to severe centrilobular emphysema is seen. New focal ill-defined pulmonary opacity is seen in the anterior right lung apex, likely infectious or inflammatory etiology with neoplasm considered less likely. Dependent atelectasis in both lower lobes has increased since prior exam. No evidence of pleural effusion. Upper abdomen: No acute findings. Musculoskeletal: No suspicious bone lesions identified. Review of the MIP images confirms the above findings. IMPRESSION: 2 small subsegmental pulmonary emboli in right middle and lower lobes which are new since 02/16/2020 exam, and may be acute or subacute in age. New focal ill-defined pulmonary opacity in anterior right lung apex, likely infectious or inflammatory etiology with neoplasm considered less likely. Recommend continued follow-up by CT in 2-3 months. Increased dependent atelectasis in both lower lobes. Stable mild mediastinal and hilar lymphadenopathy, consistent with old granulomatous disease. Emphysema (ICD10-J43.9). Electronically Signed   By: Marlaine Hind M.D.   On: 05/29/2020 20:49   CT Abdomen Pelvis W Contrast  Result Date: 05/29/2020 CLINICAL DATA:  Abdominal pain. Suprapubic abdominal pain. History of prior nephrectomy. EXAM: CT ABDOMEN AND PELVIS WITH CONTRAST TECHNIQUE: Multidetector CT imaging of the abdomen  and pelvis was performed using the standard protocol following bolus administration of intravenous contrast. CONTRAST:  161mL OMNIPAQUE IOHEXOL 300 MG/ML  SOLN COMPARISON:  CT dated 07/14/2012 FINDINGS: Lower chest: There is atelectasis at the lung bases.The heart size appears to be mildly enlarged. There is suggestion of emphysematous changes at the lung bases. Hepatobiliary: The liver is normal. Normal gallbladder.There is no biliary ductal dilation. Pancreas: Normal contours without ductal dilatation. No peripancreatic fluid collection. Spleen: Unremarkable. Adrenals/Urinary Tract: --Adrenal glands: Unremarkable. --Right kidney/ureter: The right adrenal gland is surgically absent. --Left kidney/ureter: There is left-sided mild to moderate hydroureteronephrosis secondary to an obstructing set of stones in the proximal left ureter. The  largest stone measures up to approximately 7 mm (axial series 2, image 46). There are additional punctate nonobstructing stones throughout the left kidney. There are multiple small cystic appearing lesions involving the left kidney some of these measure above water density and therefore are indeterminate on this study. --Urinary bladder: Multiple small bladder stones/gravel are noted in the patient's urinary bladder. The urinary bladder is mildly distended. Stomach/Bowel: --Stomach/Duodenum: No hiatal hernia or other gastric abnormality. Normal duodenal course and caliber. --Small bowel: Unremarkable. --Colon: Rectosigmoid diverticulosis without acute inflammation. --Appendix: Not visualized. No right lower quadrant inflammation or free fluid. Vascular/Lymphatic: Atherosclerotic calcification is present within the non-aneurysmal abdominal aorta, without hemodynamically significant stenosis. --No retroperitoneal lymphadenopathy. --No mesenteric lymphadenopathy. --No pelvic or inguinal lymphadenopathy. Reproductive: There is a penile prosthesis in place. Other: No ascites or free air.  The abdominal wall is normal. Musculoskeletal. No acute displaced fractures. IMPRESSION: 1. Left-sided mild to moderate hydroureteronephrosis secondary to an obstructing set of stones in the proximal left ureter. The largest stone measures up to approximately 7 mm. 2. Multiple small bladder stones/gravel are noted. 3. Rectosigmoid diverticulosis without acute inflammation. 4. There are multiple small cystic appearing lesions involving the left kidney some of these measure above water density and therefore are indeterminate on this study. Follow-up with a nonemergent outpatient renal ultrasound is recommended. 5. Status post right-sided nephrectomy. Aortic Atherosclerosis (ICD10-I70.0). Electronically Signed   By: Constance Holster M.D.   On: 05/29/2020 19:22   DG Chest Portable 1 View  Result Date: 05/29/2020 CLINICAL DATA:  New oxygen requirement. EXAM: PORTABLE CHEST 1 VIEW COMPARISON:  Chest radiographs 02/13/2020 and CTA 02/16/2020 FINDINGS: Telemetry leads overlie the chest. The cardiomediastinal silhouette is unchanged with normal heart size. Aortic atherosclerosis is noted. Lung volumes are lower than on the prior study. Chronic interstitial coarsening is similar to the prior radiographs with emphysema shown on CT. There may be a trace left pleural effusion. No pneumothorax is identified. No acute osseous abnormality is seen. IMPRESSION: Chronic lung changes without evidence of pneumonia or edema. Possible trace left pleural effusion. Electronically Signed   By: Logan Bores M.D.   On: 05/29/2020 15:18   DG C-Arm 1-60 Min-No Report  Result Date: 05/30/2020 Fluoroscopy was utilized by the requesting physician.  No radiographic interpretation.   ECHOCARDIOGRAM COMPLETE  Result Date: 05/30/2020    ECHOCARDIOGRAM REPORT   Patient Name:   Jerome Mays [facility] Date of Exam: 05/30/2020 Medical Rec #:  [id number]      Height:       73.0 in Accession #:    [id number]     Weight:       180.0 lb Date of Birth:   Dec 14, 1940     BSA:          2.057 m Patient Age:    79 years       BP:           128/77 mmHg Patient Gender: M              HR:           73 bpm. Exam Location:  [facility] Procedure: 2D Echo, Cardiac Doppler and Color Doppler Indications:    Pulmonary Embolus 415.19 / I26.99  History:        Patient has no prior history of Echocardiogram examinations.                 COPD; Risk Factors:Dyslipidemia. ALS (amyotrophic lateral  sclerosis).  Sonographer:    Alvino Chapel RCS Referring Phys: [id number] [place]  1. Left ventricular ejection fraction, by estimation, is 60 to 65%. The left ventricle has normal function. The left ventricle has no regional wall motion abnormalities. Left ventricular diastolic parameters are indeterminate.  2. Right ventricular systolic function is normal. The right ventricular size is mildly enlarged. Tricuspid regurgitation signal is inadequate for assessing PA pressure.  3. A small pericardial effusion is present. The pericardial effusion is anterior to the right ventricle.  4. The mitral valve is grossly normal. Trivial mitral valve regurgitation. Moderate mitral annular calcification.  5. The aortic valve is tricuspid. Aortic valve regurgitation is trivial. Mild to moderate aortic valve sclerosis/calcification is present, without any evidence of aortic stenosis.  6. The inferior vena cava is normal in size with <50% respiratory variability, suggesting right atrial pressure of 8 mmHg. FINDINGS  Left Ventricle: Left ventricular ejection fraction, by estimation, is 60 to 65%. The left ventricle has normal function. The left ventricle has no regional wall motion abnormalities. The left ventricular internal cavity size was normal in size. There is  borderline left ventricular hypertrophy. Left ventricular diastolic parameters are indeterminate. Right Ventricle: The right ventricular size is mildly enlarged. No increase in right ventricular wall thickness.  Right ventricular systolic function is normal. Tricuspid regurgitation signal is inadequate for assessing PA pressure. Left Atrium: Left atrial size was normal in size. Right Atrium: Right atrial size was normal in size. Pericardium: A small pericardial effusion is present. The pericardial effusion is anterior to the right ventricle. Mitral Valve: The mitral valve is grossly normal. Moderate mitral annular calcification. Trivial mitral valve regurgitation. Tricuspid Valve: The tricuspid valve is grossly normal. Tricuspid valve regurgitation is trivial. Aortic Valve: The aortic valve is tricuspid. There is mild aortic valve annular calcification. Aortic valve regurgitation is trivial. Mild to moderate aortic valve sclerosis/calcification is present, without any evidence of aortic stenosis. Pulmonic Valve: The pulmonic valve was not well visualized. Pulmonic valve regurgitation is trivial. Aorta: The aortic root is normal in size and structure. Venous: The inferior vena cava is normal in size with less than 50% respiratory variability, suggesting right atrial pressure of 8 mmHg. IAS/Shunts: No atrial level shunt detected by color flow Doppler.  LEFT VENTRICLE PLAX 2D LVIDd:         4.80 cm  Diastology LVIDs:         2.70 cm  LV e' medial:    6.09 cm/s LV PW:         0.95 cm  LV E/e' medial:  8.2 LV IVS:        0.95 cm  LV e' lateral:   6.53 cm/s LVOT diam:     1.80 cm  LV E/e' lateral: 7.7 LV SV:         59 LV SV Index:   29 LVOT Area:     2.54 cm  RIGHT VENTRICLE RV S prime:     13.50 cm/s TAPSE (M-mode): 2.1 cm LEFT ATRIUM             Index       RIGHT ATRIUM           Index LA diam:        3.70 cm 1.80 cm/m  RA Area:     16.20 cm LA Vol (A2C):   71.9 ml 34.95 ml/m RA Volume:   37.80 ml  18.37 ml/m LA Vol (A4C):   56.1 ml 27.27  ml/m LA Biplane Vol: 67.0 ml 32.57 ml/m  AORTIC VALVE LVOT Vmax:   108.00 cm/s LVOT Vmean:  74.300 cm/s LVOT VTI:    0.233 m  AORTA Ao Root diam: 3.50 cm MITRAL VALVE MV Area (PHT):  1.76 cm    SHUNTS MV Decel Time: 430 msec    Systemic VTI:  0.23 m MV E velocity: 50.10 cm/s  Systemic Diam: 1.80 cm MV A velocity: 99.40 cm/s MV E/A ratio:  0.50 Rozann Lesches MD Electronically signed by Rozann Lesches MD Signature Date/Time: 05/30/2020/1:12:49 PM    Final     Assessment: 79 year old man with a left solitary kidney and 7 mm proximal ureteral calculi POD1 s/p left ureteral stent placement.   Plan: -Kidney function is stable -Patient is established with Dr. Lawerance Bach for urology and will need follow-up with him to address stone; this was communicated to wife and patient that they need to call and let him know a stent was placed but the stone is still present -Gross hematuria may be expected in the setting of anticoagulation with recent instrumentation    Jacalyn Lefevre, MD Urology 05/31/2020, 7:22 AM

## 2020-05-31 NOTE — Evaluation (Signed)
Physical Therapy Evaluation Patient Details Name: Jerome Mays MRN: [id number] DOB: 1940-09-03 Today's Date: 05/31/2020   History of Present Illness  Pt is an 79 year old male with medical history significant of ALS (dx 2021), iron deficiency anemia, osteoarthritis, atypical chest pain, colon polyps, constipation, COPD, CAD, GERD, gout, urolithiasis, RCC with right nephrectomy, hyperlipidemia, retinal detachment and admitted for PE and Hydroureteronephrosis.  Pt found to have left solitary kidney and 7 mm proximal ureteral calculi POD1 s/p left ureteral stent placement.  Clinical Impression  Pt admitted with above diagnosis.  Pt currently with functional limitations due to the deficits listed below (see PT Problem List). Pt will benefit from skilled PT to increase their independence and safety with mobility to allow discharge to the venue listed below.  Pt assisted with ambulating in hallway.  Pt typically requires assist for ADLs due to limited use of distal UEs (from ALS).  Pt with SPO2 86% on room air after mobility so reapplied 2L O2 Jerome Mays (SpO2 92%).  RN aware.     Follow Up Recommendations Outpatient PT (per primary MD for ALS)    Equipment Recommendations  None recommended by PT    Recommendations for Other Services       Precautions / Restrictions Precautions Precautions: Fall Precaution Comments: pt has no grip or use of distal arms      Mobility  Bed Mobility Overal bed mobility: Needs Assistance Bed Mobility: Supine to Sit     Supine to sit: Min assist     General bed mobility comments: assist for trunk    Transfers Overall transfer level: Needs assistance Equipment used: None Transfers: Sit to/from Stand Sit to Stand: Min guard         General transfer comment: pt unable to use UEs however good strength of LEs to rise, min/guard for safety  Ambulation/Gait Ambulation/Gait assistance: Min guard Gait Distance (Feet): 200 Feet Assistive device: None Gait  Pattern/deviations: Step-through pattern;Decreased stride length     General Gait Details: slow but steady gait, min/guard for safety  Stairs            Wheelchair Mobility    Modified Rankin (Stroke Patients Only)       Balance Overall balance assessment: No apparent balance deficits (not formally assessed)                                           Pertinent Vitals/Pain Pain Assessment: No/denies pain    Home Living Family/patient expects to be discharged to:: Private residence Living Arrangements: Spouse/significant other Available Help at Discharge: Family Type of Home: House Home Access: Stairs to enter Entrance Stairs-Rails: Right Entrance Stairs-Number of Steps: 2 Home Layout: Able to live on main level with bedroom/bathroom Home Equipment: Hand held shower head      Prior Function Level of Independence: Needs assistance   Gait / Transfers Assistance Needed: independent  ADL's / Homemaking Assistance Needed: assist required for bathing and dressing due to limited use of UEs        Hand Dominance        Extremity/Trunk Assessment   Upper Extremity Assessment Upper Extremity Assessment: RUE deficits/detail;LUE deficits/detail RUE Deficits / Details: minimal shoulder movement and no functional use of distal arm - keeps flaccid LUE Deficits / Details: minimal shoulder movement and no functional use of distal arm - keeps flaccid    Lower Extremity  Assessment Lower Extremity Assessment: Overall WFL for tasks assessed    Cervical / Trunk Assessment Cervical / Trunk Assessment: Normal  Communication   Communication: No difficulties  Cognition Arousal/Alertness: Awake/alert Behavior During Therapy: WFL for tasks assessed/performed Overall Cognitive Status: Within Functional Limits for tasks assessed                                        General Comments      Exercises     Assessment/Plan    PT Assessment  Patient needs continued PT services  PT Problem List Decreased strength;Decreased mobility;Decreased activity tolerance       PT Treatment Interventions DME instruction;Gait training;Therapeutic exercise;Balance training;Functional mobility training;Therapeutic activities;Patient/family education    PT Goals (Current goals can be found in the Care Plan section)  Acute Rehab PT Goals PT Goal Formulation: With patient/family Time For Goal Achievement: 06/14/20 Potential to Achieve Goals: Good    Frequency Min 3X/week   Barriers to discharge        Co-evaluation               AM-PAC PT "6 Clicks" Mobility  Outcome Measure Help needed turning from your back to your side while in a flat bed without using bedrails?: A Little Help needed moving from lying on your back to sitting on the side of a flat bed without using bedrails?: A Little Help needed moving to and from a bed to a chair (including a wheelchair)?: A Little Help needed standing up from a chair using your arms (Mays.g., wheelchair or bedside chair)?: A Little Help needed to walk in hospital room?: A Little Help needed climbing 3-5 steps with a railing? : A Little 6 Click Score: 18    End of Session Equipment Utilized During Treatment: Gait belt Activity Tolerance: Patient tolerated treatment well Patient left: in chair;with call bell/phone within reach;with chair alarm set;with family/visitor present Nurse Communication: Mobility status PT Visit Diagnosis: Difficulty in walking, not elsewhere classified (R26.2)    Time: 7619-5093 PT Time Calculation (min) (ACUTE ONLY): 16 min   Charges:   PT Evaluation $PT Eval Low Complexity: 1 Low          Jerome Mays PT, DPT Acute Rehabilitation Services Pager: [phone] Office: [phone]  Jerome Mays,Jerome Mays 05/31/2020, 10:42 AM

## 2020-05-31 NOTE — Progress Notes (Signed)
PROGRESS NOTE    Jerome Mays  [id number] DOB: 01-19-1941 DOA: 05/29/2020 PCP: [name], MD    Chief Complaint  Patient presents with   Abdominal Pain    Brief Narrative:  Jerome Mays is a 79 y.o. male with medical history significant of ALS, COPD, HLD, CAD, GERD, gout, urolithiasis, RCC with right nephrectomy,  retinal detachment presents to [facility] emergency department due to left flank pain associated with nausea/one episode of emesis with minimal mucus content and recent decrease in appetite. He is transferred to [facility] long for urology eval, he is found to have PE  Subjective:  S/p stent placement, some hematuria, on heparin drip, denies pain, has hypoxia , 02 dropped to 86% on room air while walking with PT  Assessment & Plan:   Principal Problem:   Pulmonary emboli ([place]) Active Problems:   Gout   COPD, group B, by GOLD 2017 classification ([place])   ALS (amyotrophic lateral sclerosis) ([place])   GERD (gastroesophageal reflux disease)   Hydroureteronephrosis   Mild protein malnutrition ([place])   AKI (acute kidney injury) ([place])   Left-sided hydroureteronephrosis/obstructing left proximal ureter stones/small bladder stones/gravel  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 Lawerance Bach for definite save stone treatment  Left renal cyst: There are multiple small cystic appearing lesions involving the left kidney some of these measure above water density and therefore  are indeterminate on this study Will order renal US  PE with acute hypoxic respiratory failure -CTA chest showed "2 small subsegmental pulmonary emboli in right middle and lower lobes which are new since 02/16/2020 exam, and may be acute or  subacute in age." -He was treated with heparin drip, transition to apixaban, hold asa -Echocardiogram LVEF 60 to 65%, right ventricular systolic function is normal, right ventricular size is mildly enlarged.  Details please see the full report - Arrange home o2  Incidental findings on CT scan need follow up on by pcp: -New focal ill-defined pulmonary opacity in anterior right lung apex, likely infectious or inflammatory etiology with neoplasm considered less likely. Recommend continued follow-up by CT in 2-3 months.   COPD, group B, by GOLD 2017 classification ([place]) Stable ,Continue Trelegy Ellipta. Supplemental oxygen as needed.    Gout Continue allopurinol.    ALS (amyotrophic lateral sclerosis) ([place]) Supportive care. Continue Rilutek 50 mg p.o. every 12 hours.    GERD (gastroesophageal reflux disease) Continue pantoprazole 40 mg p.o. daily.    Mild protein malnutrition ([place]) Consult nutritional services.  DVT prophylaxis: Fully anticoagulated    Code Status: Full Family Communication: wife at bedside  Disposition:   Status is: Inpatient  Dispo: The patient is from: home              Anticipated d/c is to: home with home o2 and outaptient PT              Anticipated d/c date is: 12/25                Consultants:   urology  Procedures:  Status post left ureteral stent placement,  Antimicrobials:   Rocephin     Objective: Vitals:   05/30/20 1900 05/30/20 2121 05/31/20 0601 05/31/20 0816  BP: (!) 153/83 (!) 148/85 128/90   Pulse: 83 74 77   Resp:  18 20 18    Temp: 98.5 F (36.9 C) 98.1 F (36.7 C) 98 F (36.7 C)   TempSrc: Oral Oral Oral   SpO2: 91% 92% 94% 95%  Weight:      Height:        Intake/Output Summary (Last 24 hours) at 05/31/2020 0837 Last data filed at 05/31/2020 0601 Gross per 24 hour  Intake 706.75 ml  Output 650 ml  Net 56.75 ml   Filed Weights   05/29/20 1420 05/30/20 1535  Weight: 81.6 kg 81.6 kg     Examination:  General exam: calm, NAD, left eye poor vision Respiratory system: Clear to auscultation. Respiratory effort normal. Cardiovascular system: S1 & S2 heard,  No pedal edema. Gastrointestinal system: Abdomen is nondistended, soft and nontender. Normal bowel sounds heard. Central nervous system: Alert and oriented. No focal neurological deficits. Extremities: Generalized weakness, able to ambulate with a walker in the hallway Skin: No rashes, lesions or ulcers Psychiatry: Judgement and insight appear normal. Mood & affect appropriate.     Data Reviewed: I have personally reviewed following labs and imaging studies  CBC: Recent Labs  Lab 05/29/20 1640 05/30/20 0359 05/31/20 0445  WBC 10.0 9.1 6.4  NEUTROABS 8.6*  --   --   HGB 12.2* 12.2* 12.6*  HCT 40.1 40.2 41.5  MCV 90.5 90.1 88.5  PLT 202 211 [id number]    Basic Metabolic Panel: Recent Labs  Lab 05/29/20 1640 05/30/20 0359 05/31/20 0445  NA 139 139 139  K 4.4 3.6 4.4  CL 105 106 104  CO2 24 23 22   GLUCOSE 122* 104* 150*  BUN 23 19 25*  CREATININE 1.15 0.95 1.04  CALCIUM 9.8 9.3 9.6  MG  --   --  2.1    GFR: Estimated Creatinine Clearance: 64 mL/min (by C-G formula based on SCr of 1.04 mg/dL).  Liver Function Tests: Recent Labs  Lab 05/29/20 1640  AST 29  ALT 25  ALKPHOS 67  BILITOT 0.7  PROT 6.6  ALBUMIN 3.4*    CBG: No results for input(s): GLUCAP in the last 168 hours.   Recent Results (from the past 240 hour(s))  Urine culture     Status: Abnormal   Collection Time: 05/29/20  5:23 PM   Specimen: Urine, Random  Result Value Ref Range Status   Specimen Description   Final    URINE, RANDOM Performed at [facility], [address], [place], [place] [address]    Special Requests   Final    NONE Performed at [facility], [address], [place], [place] [address]    Culture (A)  Final    <10,000 COLONIES/mL INSIGNIFICANT GROWTH Performed at [facility] [address] [place], [place] [address]    Report Status 05/31/2020 FINAL  Final  Resp Panel by RT-PCR (Flu A&B, Covid) Nasopharyngeal Swab     Status: None   Collection Time: 05/29/20  8:43 PM   Specimen: Nasopharyngeal Swab; Nasopharyngeal(NP) swabs in vial transport medium  Result Value Ref Range Status   SARS Coronavirus 2 by RT PCR NEGATIVE NEGATIVE Final    Comment: (NOTE) SARS-CoV-2 target nucleic acids are NOT DETECTED.  The SARS-CoV-2 RNA is generally detectable in upper respiratory specimens during the acute phase of infection. The lowest concentration of SARS-CoV-2 viral copies this assay can detect is 138 copies/mL. A negative result does not preclude SARS-Cov-2 infection and should not be used as the sole basis for treatment or other patient management decisions. A  negative result may occur with  improper specimen collection/handling, submission of specimen other than nasopharyngeal swab, presence of viral mutation(s) within the areas targeted by this assay, and inadequate number of viral copies(<138 copies/mL). A negative result must be combined with clinical observations, patient history, and epidemiological information. The expected result is Negative.  Fact Sheet for Patients:  EntrepreneurPulse.com.au  Fact Sheet for Healthcare Providers:  IncredibleEmployment.be  This test is no t yet approved or cleared by the [place] FDA and  has been authorized for detection and/or diagnosis of SARS-CoV-2 by FDA under an Emergency Use Authorization (EUA). This EUA will remain  in effect (meaning this test can be used) for the duration of the COVID-19 declaration under Section 564(b)(1) of the Act, 21 U.S.C.section 360bbb-3(b)(1), unless the authorization is terminated  or revoked sooner.       Influenza A by PCR NEGATIVE NEGATIVE Final   Influenza B by PCR NEGATIVE NEGATIVE Final    Comment: (NOTE) The Xpert Xpress SARS-CoV-2/FLU/RSV plus  assay is intended as an aid in the diagnosis of influenza from Nasopharyngeal swab specimens and should not be used as a sole basis for treatment. Nasal washings and aspirates are unacceptable for Xpert Xpress SARS-CoV-2/FLU/RSV testing.  Fact Sheet for Patients: EntrepreneurPulse.com.au  Fact Sheet for Healthcare Providers: IncredibleEmployment.be  This test is not yet approved or cleared by the [place] FDA and has been authorized for detection and/or diagnosis of SARS-CoV-2 by FDA under an Emergency Use Authorization (EUA). This EUA will remain in effect (meaning this test can be used) for the duration of the COVID-19 declaration under Section 564(b)(1) of the Act, 21 U.S.C. section 360bbb-3(b)(1), unless the authorization is terminated or revoked.  Performed at [facility], [address] [place], [place] [address]   Surgical PCR screen     Status: None   Collection Time: 05/30/20  3:02 PM   Specimen: Nasal Mucosa; Nasal Swab  Result Value Ref Range Status   MRSA, PCR NEGATIVE NEGATIVE Final   Staphylococcus aureus NEGATIVE NEGATIVE Final    Comment: (NOTE) The Xpert SA Assay (FDA approved for NASAL specimens in patients 74 years of age and older), is one component of a comprehensive surveillance program. It is not intended to diagnose infection nor to guide or monitor treatment. Performed at [facility], [place] [address] [place], [place] [address]          Radiology Studies: CT Angio Chest PE W/Cm &/Or Wo Cm  Result Date: 05/29/2020 CLINICAL DATA:  Shortness of breath and elevated D-dimer. Clinical suspicion for pulmonary embolism. EXAM: CT ANGIOGRAPHY CHEST WITH CONTRAST TECHNIQUE: Multidetector CT imaging of the chest was performed using the standard protocol during bolus administration of intravenous contrast. Multiplanar CT image reconstructions and MIPs were obtained to evaluate the vascular anatomy.  CONTRAST:  93mL OMNIPAQUE IOHEXOL 350 MG/ML SOLN COMPARISON:  02/16/2020 FINDINGS: Cardiovascular: 2 small filling defects are seen within subsegmental pulmonary artery branches in the anterior right middle lobe (image 178/6), and in the posterior right lower lobe (image 195/6). These are new since previous study of 02/16/2020, and may be represent acute or subacute pulmonary emboli. No other sites of pulmonary embolism identified. No evidence of thoracic aortic dissection or aneurysm. Mediastinum/Nodes: Stable mild mediastinal and hilar lymphadenopathy with central calcifications, consistent with old granulomatous disease. No new or increased sites of lymphadenopathy identified. Lungs/Pleura: Moderate to severe centrilobular emphysema is seen. New focal ill-defined pulmonary opacity is seen in the anterior right lung apex, likely infectious  or inflammatory etiology with neoplasm considered less likely. Dependent atelectasis in both lower lobes has increased since prior exam. No evidence of pleural effusion. Upper abdomen: No acute findings. Musculoskeletal: No suspicious bone lesions identified. Review of the MIP images confirms the above findings. IMPRESSION: 2 small subsegmental pulmonary emboli in right middle and lower lobes which are new since 02/16/2020 exam, and may be acute or subacute in age. New focal ill-defined pulmonary opacity in anterior right lung apex, likely infectious or inflammatory etiology with neoplasm considered less likely. Recommend continued follow-up by CT in 2-3 months. Increased dependent atelectasis in both lower lobes. Stable mild mediastinal and hilar lymphadenopathy, consistent with old granulomatous disease. Emphysema (ICD10-J43.9). Electronically Signed   By: Marlaine Hind M.D.   On: 05/29/2020 20:49   CT Abdomen Pelvis W Contrast  Result Date: 05/29/2020 CLINICAL DATA:  Abdominal pain. Suprapubic abdominal pain. History of prior nephrectomy. EXAM: CT ABDOMEN AND PELVIS WITH  CONTRAST TECHNIQUE: Multidetector CT imaging of the abdomen and pelvis was performed using the standard protocol following bolus administration of intravenous contrast. CONTRAST:  115mL OMNIPAQUE IOHEXOL 300 MG/ML  SOLN COMPARISON:  CT dated 07/14/2012 FINDINGS: Lower chest: There is atelectasis at the lung bases.The heart size appears to be mildly enlarged. There is suggestion of emphysematous changes at the lung bases. Hepatobiliary: The liver is normal. Normal gallbladder.There is no biliary ductal dilation. Pancreas: Normal contours without ductal dilatation. No peripancreatic fluid collection. Spleen: Unremarkable. Adrenals/Urinary Tract: --Adrenal glands: Unremarkable. --Right kidney/ureter: The right adrenal gland is surgically absent. --Left kidney/ureter: There is left-sided mild to moderate hydroureteronephrosis secondary to an obstructing set of stones in the proximal left ureter. The largest stone measures up to approximately 7 mm (axial series 2, image 46). There are additional punctate nonobstructing stones throughout the left kidney. There are multiple small cystic appearing lesions involving the left kidney some of these measure above water density and therefore are indeterminate on this study. --Urinary bladder: Multiple small bladder stones/gravel are noted in the patient's urinary bladder. The urinary bladder is mildly distended. Stomach/Bowel: --Stomach/Duodenum: No hiatal hernia or other gastric abnormality. Normal duodenal course and caliber. --Small bowel: Unremarkable. --Colon: Rectosigmoid diverticulosis without acute inflammation. --Appendix: Not visualized. No right lower quadrant inflammation or free fluid. Vascular/Lymphatic: Atherosclerotic calcification is present within the non-aneurysmal abdominal aorta, without hemodynamically significant stenosis. --No retroperitoneal lymphadenopathy. --No mesenteric lymphadenopathy. --No pelvic or inguinal lymphadenopathy. Reproductive: There is a  penile prosthesis in place. Other: No ascites or free air. The abdominal wall is normal. Musculoskeletal. No acute displaced fractures. IMPRESSION: 1. Left-sided mild to moderate hydroureteronephrosis secondary to an obstructing set of stones in the proximal left ureter. The largest stone measures up to approximately 7 mm. 2. Multiple small bladder stones/gravel are noted. 3. Rectosigmoid diverticulosis without acute inflammation. 4. There are multiple small cystic appearing lesions involving the left kidney some of these measure above water density and therefore are indeterminate on this study. Follow-up with a nonemergent outpatient renal ultrasound is recommended. 5. Status post right-sided nephrectomy. Aortic Atherosclerosis (ICD10-I70.0). Electronically Signed   By: Constance Holster M.D.   On: 05/29/2020 19:22   DG Chest Portable 1 View  Result Date: 05/29/2020 CLINICAL DATA:  New oxygen requirement. EXAM: PORTABLE CHEST 1 VIEW COMPARISON:  Chest radiographs 02/13/2020 and CTA 02/16/2020 FINDINGS: Telemetry leads overlie the chest. The cardiomediastinal silhouette is unchanged with normal heart size. Aortic atherosclerosis is noted. Lung volumes are lower than on the prior study. Chronic interstitial coarsening is similar to the prior  radiographs with emphysema shown on CT. There may be a trace left pleural effusion. No pneumothorax is identified. No acute osseous abnormality is seen. IMPRESSION: Chronic lung changes without evidence of pneumonia or edema. Possible trace left pleural effusion. Electronically Signed   By: Logan Bores M.D.   On: 05/29/2020 15:18   DG C-Arm 1-60 Min-No Report  Result Date: 05/30/2020 Fluoroscopy was utilized by the requesting physician.  No radiographic interpretation.   ECHOCARDIOGRAM COMPLETE  Result Date: 05/30/2020    ECHOCARDIOGRAM REPORT   Patient Name:   QUANTAY ZAREMBA [facility] Date of Exam: 05/30/2020 Medical Rec #:  [id number]      Height:       73.0 in Accession  #:    [id number]     Weight:       180.0 lb Date of Birth:  19-Jan-1941     BSA:          2.057 m Patient Age:    54 years       BP:           128/77 mmHg Patient Gender: M              HR:           73 bpm. Exam Location:  [facility] Procedure: 2D Echo, Cardiac Doppler and Color Doppler Indications:    Pulmonary Embolus 415.19 / I26.99  History:        Patient has no prior history of Echocardiogram examinations.                 COPD; Risk Factors:Dyslipidemia. ALS (amyotrophic lateral                 sclerosis).  Sonographer:    Alvino Chapel RCS Referring Phys: [id number] [place]  1. Left ventricular ejection fraction, by estimation, is 60 to 65%. The left ventricle has normal function. The left ventricle has no regional wall motion abnormalities. Left ventricular diastolic parameters are indeterminate.  2. Right ventricular systolic function is normal. The right ventricular size is mildly enlarged. Tricuspid regurgitation signal is inadequate for assessing PA pressure.  3. A small pericardial effusion is present. The pericardial effusion is anterior to the right ventricle.  4. The mitral valve is grossly normal. Trivial mitral valve regurgitation. Moderate mitral annular calcification.  5. The aortic valve is tricuspid. Aortic valve regurgitation is trivial. Mild to moderate aortic valve sclerosis/calcification is present, without any evidence of aortic stenosis.  6. The inferior vena cava is normal in size with <50% respiratory variability, suggesting right atrial pressure of 8 mmHg. FINDINGS  Left Ventricle: Left ventricular ejection fraction, by estimation, is 60 to 65%. The left ventricle has normal function. The left ventricle has no regional wall motion abnormalities. The left ventricular internal cavity size was normal in size. There is  borderline left ventricular hypertrophy. Left ventricular diastolic parameters are indeterminate. Right Ventricle: The right ventricular size is mildly  enlarged. No increase in right ventricular wall thickness. Right ventricular systolic function is normal. Tricuspid regurgitation signal is inadequate for assessing PA pressure. Left Atrium: Left atrial size was normal in size. Right Atrium: Right atrial size was normal in size. Pericardium: A small pericardial effusion is present. The pericardial effusion is anterior to the right ventricle. Mitral Valve: The mitral valve is grossly normal. Moderate mitral annular calcification. Trivial mitral valve regurgitation. Tricuspid Valve: The tricuspid valve is grossly normal. Tricuspid valve regurgitation is trivial. Aortic Valve: The aortic valve is tricuspid.  There is mild aortic valve annular calcification. Aortic valve regurgitation is trivial. Mild to moderate aortic valve sclerosis/calcification is present, without any evidence of aortic stenosis. Pulmonic Valve: The pulmonic valve was not well visualized. Pulmonic valve regurgitation is trivial. Aorta: The aortic root is normal in size and structure. Venous: The inferior vena cava is normal in size with less than 50% respiratory variability, suggesting right atrial pressure of 8 mmHg. IAS/Shunts: No atrial level shunt detected by color flow Doppler.  LEFT VENTRICLE PLAX 2D LVIDd:         4.80 cm  Diastology LVIDs:         2.70 cm  LV e' medial:    6.09 cm/s LV PW:         0.95 cm  LV E/e' medial:  8.2 LV IVS:        0.95 cm  LV e' lateral:   6.53 cm/s LVOT diam:     1.80 cm  LV E/e' lateral: 7.7 LV SV:         59 LV SV Index:   29 LVOT Area:     2.54 cm  RIGHT VENTRICLE RV S prime:     13.50 cm/s TAPSE (M-mode): 2.1 cm LEFT ATRIUM             Index       RIGHT ATRIUM           Index LA diam:        3.70 cm 1.80 cm/m  RA Area:     16.20 cm LA Vol (A2C):   71.9 ml 34.95 ml/m RA Volume:   37.80 ml  18.37 ml/m LA Vol (A4C):   56.1 ml 27.27 ml/m LA Biplane Vol: 67.0 ml 32.57 ml/m  AORTIC VALVE LVOT Vmax:   108.00 cm/s LVOT Vmean:  74.300 cm/s LVOT VTI:    0.233 m   AORTA Ao Root diam: 3.50 cm MITRAL VALVE MV Area (PHT): 1.76 cm    SHUNTS MV Decel Time: 430 msec    Systemic VTI:  0.23 m MV E velocity: 50.10 cm/s  Systemic Diam: 1.80 cm MV A velocity: 99.40 cm/s MV E/A ratio:  0.50 Rozann Lesches MD Electronically signed by Rozann Lesches MD Signature Date/Time: 05/30/2020/1:12:49 PM    Final         Scheduled Meds:  allopurinol  300 mg Oral Daily   aspirin EC  81 mg Oral Daily   clobetasol cream  1 application Topical BID   fluticasone furoate-vilanterol  1 puff Inhalation Daily   And   umeclidinium bromide  1 puff Inhalation Daily   hydrOXYzine  10 mg Oral QHS   ketorolac  1 drop Left Eye QID   loratadine  10 mg Oral Daily   mupirocin ointment  1 application Nasal BID   pantoprazole  40 mg Oral Daily   prednisoLONE acetate  1 drop Left Eye QID   riluzole  50 mg Oral Q12H   Continuous Infusions:  cefTRIAXone (ROCEPHIN)  IV Stopped (05/30/20 0408)   heparin 1,200 Units/hr (05/30/20 1501)     LOS: 1 day   Time spent: 23mins Greater than 50% of this time was spent in counseling, explanation of diagnosis, planning of further management, and coordination of care.  I have personally reviewed and interpreted on  05/31/2020 daily labs, tele strips, imagings as discussed above under date review session and assessment and plans.  I reviewed all nursing notes, pharmacy notes, consultant notes,  vitals, pertinent old records  I have discussed plan of care as described above with RN , patient and family on 05/31/2020  Voice Recognition /Dragon dictation system was used to create this note, attempts have been made to correct errors. Please contact the author with questions and/or clarifications.   Florencia Reasons, MD PhD FACP [facility]  Available via Epic secure chat 7am-7pm for nonurgent issues Please page for urgent issues To page the attending provider between 7A-7P or the covering provider during after hours 7P-7A, please  log into the web site [email or web address] and access using universal [place] password for that web site. If you do not have the password, please call the hospital operator.    05/31/2020, 8:37 AM

## 2020-05-31 NOTE — Progress Notes (Addendum)
[facility] for IV heparin >> PO Apixaban  Indication: pulmonary emboli   Allergies  Allergen Reactions  . Lipitor [Atorvastatin] Rash and Other (See Comments)    Passed out    Patient Measurements: Height: 6\' 1"  (185.4 cm) Weight: 81.6 kg (179 lb 14.3 oz) IBW/kg (Calculated) : 79.9  Heparin dosing weight: actual body weight   Vital Signs: Temp: 98 F (36.7 C) (12/24 0601) Temp Source: Oral (12/24 0601) BP: 128/90 (12/24 0601) Pulse Rate: 77 (12/24 0601)  Labs: Recent Labs    05/29/20 1640 05/30/20 0359 05/30/20 0826 05/30/20 2002 05/31/20 0445  HGB 12.2* 12.2*  --   --  12.6*  HCT 40.1 40.2  --   --  41.5  PLT 202 211  --   --  197  HEPARINUNFRC  --   --  0.45 0.35 0.37  CREATININE 1.15 0.95  --   --  1.04    Estimated Creatinine Clearance: 64 mL/min (by C-G formula based on SCr of 1.04 mg/dL).   Medical History: Past Medical History:  Diagnosis Date  . 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 Anemia    low iron  . Arthritis   . Atypical chest pain 01/04/2020  . Colonic polyp   . Constipation   . COPD (chronic obstructive pulmonary disease) ([place])   . Coronary artery calcification seen on CAT scan 01/04/2020  . GERD (gastroesophageal reflux disease)   . Gout   . History of kidney stones   . Malignant tumor of kidney ([place])   . Muscle atrophy   . Pain    LOWER BACK AND LEFT HIP - HX OF PREVIOUS LUMBAR AND CERVICAL SURGERY--PT STATES HE HAS HAD NUMBNESS IN BOTH FEET SINCE HIS BACK SURGERY  . Pure hypercholesterolemia 01/04/2020  . Renal calculus   . Retinal detachment   . Shortness of breath 01/04/2020    Assessment: 79 y/o M with PMH of ALS, iron deficiency anemia, osteoarthritis, atypical chest pain, colon polyps, constipation, COPD, CAD, GERD, gout, urolithiasis, RCC with right nephrectomy, hyperlipidemia, retinal detachment who presented to [facility] on 05/29/2020 with complaints of left flank pain, found to  have hydroureteronephrosis and PE. Pharmacy consulted for IV heparin dosing for PE. Patient transferred to [facility] on 05/30/2020 and underwent cystoscopy and insertion of left JJ stent. Heparin infusion was not stopped for procedure.   Today, 05/31/20:  Heparin level = 0.37 units/mL, remains therapeutic  CBC: Hgb 12.6, Pltc WNL  Patient having some hematuria post-procedure, confirmed with urology 05/30/2020 that some hematuria is to be expected. This morning, RN reports that hematuria is about the same as yesterday, no clots noted.    No infusion issues noted per nursing  Goal of Therapy:  Heparin level 0.3-0.7 units/ml Monitor platelets by anticoagulation protocol: Yes    Plan:  Continue heparin infusion at 1200 units/hr Daily CBC, heparin level Monitor for worsening signs of bleeding   Lindell Spar, PharmD, BCPS Clinical Pharmacist  05/31/2020 7:19 AM   Addendum: Asked to transition from IV heparin to PO Apixaban.  Plan: Discontinue heparin infusion Start Apixaban 10mg  PO BID x 7 days, then 5mg  PO BID thereafter Monitor CBC and for s/sx of worsening bleeding D/C Aspirin 81mg  PO daily per MD Pharmacy to provide education prior to discharge   Lindell Spar, PharmD, BCPS Clinical Pharmacist  05/31/2020 12:17 PM

## 2020-06-01 DIAGNOSIS — J449 Chronic obstructive pulmonary disease, unspecified: Secondary | ICD-10-CM

## 2020-06-01 LAB — BASIC METABOLIC PANEL
Anion gap: 10 (ref 5–15)
BUN: 30 mg/dL — ABNORMAL HIGH (ref 8–23)
CO2: 25 mmol/L (ref 22–32)
Calcium: 9.4 mg/dL (ref 8.9–10.3)
Chloride: 107 mmol/L (ref 98–111)
Creatinine, Ser: 0.99 mg/dL (ref 0.61–1.24)
GFR, Estimated: >60 mL/min (ref >60)
Glucose, Bld: 114 mg/dL — ABNORMAL HIGH (ref 70–99)
Potassium: 4 mmol/L (ref 3.5–5.1)
Sodium: 142 mmol/L (ref 135–145)

## 2020-06-01 LAB — CBC
HCT: 40.5 % (ref 39.0–52.0)
Hemoglobin: 11.9 g/dL — ABNORMAL LOW (ref 13.0–17.0)
MCH: 27 pg (ref 26.0–34.0)
MCHC: 29.4 g/dL — ABNORMAL LOW (ref 30.0–36.0)
MCV: 91.8 fL (ref 80.0–100.0)
Platelets: 189 10*3/uL (ref 150–400)
RBC: 4.41 MIL/uL (ref 4.22–5.81)
RDW: 17.4 % — ABNORMAL HIGH (ref 11.5–15.5)
WBC: 10.9 10*3/uL — ABNORMAL HIGH (ref 4.0–10.5)
nRBC: 0 % (ref 0.0–0.2)

## 2020-06-01 MED ORDER — APIXABAN 5 MG PO TABS: ORAL_TABLET | ORAL | Status: DC

## 2020-06-01 MED ORDER — METOPROLOL TARTRATE 50 MG PO TABS: ORAL_TABLET | ORAL | 0 refills | Status: DC

## 2020-06-01 MED ORDER — CEPHALEXIN 500 MG PO CAPS: 500.0000 mg | ORAL_CAPSULE | Freq: Two times a day (BID) | ORAL | 0 refills | Status: AC

## 2020-06-01 NOTE — Discharge Summary (Signed)
Discharge Summary  Jerome Mays GEX:[id number] DOB: December 22, 1940  PCP: [name], MD  Admit date: 05/29/2020 Discharge date: 06/01/2020  Time spent: 92mins, more than 50% time spent on coordination of care.   Recommendations for Outpatient Follow-up:  1. F/u with PCP within a week  for hospital discharge follow up, repeat cbc/bmp at follow up, pcp to refer to outpatient palliative care 2. F/u with urology Dr Rosana Hoes for left kidney stone, left kidney cyst, bladder stones 3. Follow-up with pulmonology, he has an appointment on January 5, pulmonology to monitor home oxygen use/duration and repeat CT chest in 2 to 3 months for right upper lung opacity  4. Ambulatory outpatient PT referral order placed  New medication at discharge: Eliquis, Keflex Discontinue medication at discharge: Discontinue aspirin   Patient brought in a medication list from home, metoprolol taken prior to CT  listed on the list. His blood pressure is stable without any blood pressure medication in the hospital, instructed patient to follow-up with PCP to verify this home medication.    Discharge Diagnoses:  Active Hospital Problems   Diagnosis Date Noted  . Pulmonary emboli ([place]) 05/29/2020  . AKI (acute kidney injury) ([place]) 05/30/2020  . Hydroureteronephrosis 05/29/2020  . Mild protein malnutrition ([place]) 05/29/2020  . GERD (gastroesophageal reflux disease)   . ALS (amyotrophic lateral sclerosis) ([place]) 02/13/2020  . COPD, group B, by GOLD 2017 classification ([place]) 02/02/2017  . Gout 03/04/2012    Resolved Hospital Problems  No resolved problems to display.    Discharge Condition: stable  Diet recommendation: Regular diet  Filed Weights   05/29/20 1420 05/30/20 1535  Weight: 81.6 kg 81.6 kg    History of present illness:  Jerome Mays a 79 y.o.malewith medical history significant ofALS, COPD, HLD, CAD, GERD, gout, urolithiasis, RCC with right nephrectomy,  retinal detachment presents  to [facility] emergency department due to left flank pain associated with nausea/one episode of emesiswith minimal mucus content and recent decrease in appetite.    Is found to have an obstructing left proximal ureteral stone with left-sided hydroureteric nephrosis ,he is transferred to [facility] long for urology eval, he is also found to have PE  Hospital Course:  Principal Problem:   Pulmonary emboli [facility]) Active Problems:   Gout   COPD, group B, by GOLD 2017 classification ([place])   ALS (amyotrophic lateral sclerosis) ([place])   GERD (gastroesophageal reflux disease)   Hydroureteronephrosis   Mild protein malnutrition ([name])   AKI (acute kidney injury) ([place])   Left-sided hydroureteronephrosis/obstructing left proximal ureter stones/small bladder stones/gravel  -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 Milford Cage on December 23, has some gross hematuria in the setting of anticoagulation,  he received Rocephin in the hospital , discharged on Keflex for 3 more days , follow-up with PCP and urology , repeat CBC and BMP at hospital discharge follow-up -Follow-up with urology Dr. Lawerance Bach for definite stone treatment  Left renal cyst: There are multiple small cystic appearing lesions involving the left kidney some of these measure above water density and therefore  are indeterminate on this study  renal US "A simple appearing cyst is noted arising from the lower pole. No concerning renal lesion was identified on this study." Follow-up with urology  PE with acute hypoxic respiratory failure -CTA chest showed "2 small subsegmental pulmonary emboli in right middle and lower lobes which are new since 02/16/2020 exam, and  may be acute or subacute in age." -He was treated with heparin drip, transition to apixaban, discontinue asa -Echocardiogram LVEF 60 to 65%, right ventricular systolic  function is normal, right ventricular size is mildly enlarged.  Details please see the full report -02 dropped to 86% on room air while walking with PT - Arrange home o2 arranged, f/u with PCP and pulmonology regarding duration of home O2  Incidental findings on CT scan need follow up on by pcp: -New focal ill-defined pulmonary opacity in anterior right lung apex, likely infectious or inflammatory etiology with neoplasm considered less likely. Recommend continued follow-up by CT in 2-3 months. -Follow-up with PCP and pulmonology regarding repeat CT scan   COPD, group B, by GOLD 2017 classification ([place]) Stable ,Continue Trelegy Ellipta. Supplemental oxygen as needed.   Gout Continue allopurinol.  ALS (amyotrophic lateral sclerosis) ([name]) Supportive care. Continue Rilutek 50 mg p.o. every 12 hours.  GERD (gastroesophageal reflux disease) Continue pantoprazole 40 mg p.o. daily.  Mild protein malnutrition ([name]) Consult nutritional services.  DVT prophylaxis: Fully anticoagulated   Code Status: Full Family Communication: wife at bedside  Disposition:  home with home o2 and outaptient PT     Consultants:   urology  Procedures:  Status post left ureteral stent placement on 12/23  Antimicrobials:   Rocephin  Discharge Exam: BP 131/81   Pulse 62   Temp (!) 97 F (36.1 C)   Resp 20   Ht 6\' 1"  (1.854 m)   Wt 81.6 kg   SpO2 92%   BMI 23.73 kg/m   General: NAD Cardiovascular: RRR Respiratory: Normal respiratory effort  Discharge Instructions You were cared for by a hospitalist during your hospital stay. If you have any questions about your discharge medications or the care you received while you were in the hospital after you are discharged, you can call the unit and asked to speak with the hospitalist on call if the hospitalist that took care of you is not available. Once you are discharged, your primary care physician will handle  any further medical issues. Please note that NO REFILLS for any discharge medications will be authorized once you are discharged, as it is imperative that you return to your primary care physician (or establish a relationship with a primary care physician if you do not have one) for your aftercare needs so that they can reassess your need for medications and monitor your lab values.  Discharge Instructions    Diet - low sodium heart healthy   Complete by: As directed    For home use only DME oxygen   Complete by: As directed    Length of Need: 6 Months   Mode or (Route): Nasal cannula   Liters per Minute: 2   Frequency: Continuous (stationary and portable oxygen unit needed)   Oxygen conserving device: Yes   Oxygen delivery system: Gas   Increase activity slowly   Complete by: As directed    No wound care   Complete by: As directed      Allergies as of 06/01/2020      Reactions   Lipitor [atorvastatin] Rash, Other (See Comments)   Passed out      Medication List    STOP taking these medications   aspirin EC 81 MG tablet     TAKE these medications   allopurinol 300 MG tablet Commonly known as: ZYLOPRIM Take 300 mg by mouth daily.   apixaban 5 MG Tabs tablet Commonly known as: ELIQUIS Take 10mg  bid  for 7 days then 5mg  bid   b complex vitamins tablet Take 1 tablet by mouth daily.   B-complex with vitamin C tablet Take 1 tablet by mouth daily.   cephALEXin 500 MG capsule Commonly known as: KEFLEX Take 1 capsule (500 mg total) by mouth 2 (two) times daily for 3 days.   cetirizine 10 MG tablet Commonly known as: ZYRTEC Take 10 mg by mouth daily.   cholecalciferol 25 MCG (1000 UNIT) tablet Commonly known as: VITAMIN D3 Take 1,000 Units by mouth daily.   clobetasol cream 0.05 % Commonly known as: TEMOVATE Apply 1 application topically 2 (two) times daily.   hydrOXYzine 10 MG tablet Commonly known as: ATARAX/VISTARIL Take 10 mg by mouth at bedtime.   ketorolac  0.5 % ophthalmic solution Commonly known as: ACULAR Place 1 drop into the left eye 4 (four) times daily.   metoprolol tartrate 50 MG tablet Commonly known as: LOPRESSOR TAKE 1 TABLET 2 HOURS PRIOR TO CT  Please verify with your primary care doctor regarding this medication What changed: additional instructions   ONE-A-DAY MENS PO Take 1 tablet by mouth daily.   pantoprazole 40 MG tablet Commonly known as: PROTONIX Take 1 tablet (40 mg total) by mouth daily.   prednisoLONE acetate 1 % ophthalmic suspension Commonly known as: PRED FORTE Place 1 drop into the left eye 4 (four) times daily.   prednisoLONE acetate 1 % ophthalmic suspension Commonly known as: PRED FORTE Place 1 drop into the left eye 4 (four) times daily.   riluzole 50 MG tablet Commonly known as: Rilutek Take 1 tablet (50 mg total) by mouth every 12 (twelve) hours.   sodium chloride 5 % ophthalmic solution Commonly known as: MURO 128 Place 1 drop into the left eye in the morning, at noon, in the evening, and at bedtime.   Trelegy Ellipta 200-62.5-25 MCG/INH Aepb Generic drug: Fluticasone-Umeclidin-Vilant Inhale 1 puff into the lungs daily.            Durable Medical Equipment  (From admission, onward)         Start     Ordered   05/31/20 0000  For home use only DME oxygen       Question Answer Comment  Length of Need 6 Months   Mode or (Route) Nasal cannula   Liters per Minute 2   Frequency Continuous (stationary and portable oxygen unit needed)   Oxygen conserving device Yes   Oxygen delivery system Gas      05/31/20 1153         Allergies  Allergen Reactions  . Lipitor [Atorvastatin] Rash and Other (See Comments)    Passed out    Follow-up Information    [name], MD Follow up in 1 week(s).   Specialty: Internal Medicine Why: hospital discharge follow up, repeat cbc/bmp at follow up please discuss with Dr Reynaldo Minium about outpatient palliative care referral  Contact  information: [place] [address] [phone]        repeat ct chest in 2-3 months Follow up.   Why: to follow up on "New focal ill-defined pulmonary opacity in anterior right lung apex"       Mannam, Praveen, MD Follow up.   Specialty: Pulmonary Disease Why: please follow up with Dr Vaughan Browner for repeat ct chest in 2-3 months Contact information: [place] [address] [phone]        Myrlene Broker, MD Follow up.   Specialty:  Urology Why: left kidney stones, left kidney cyst bladder stones Contact information: [address] [place] [address] [phone]        follow up with your primary care doctor or your lung doctor regarding home oxygen duration Follow up.                The results of significant diagnostics from this hospitalization (including imaging, microbiology, ancillary and laboratory) are listed below for reference.    Significant Diagnostic Studies: CT Angio Chest PE W/Cm &/Or Wo Cm  Result Date: 05/29/2020 CLINICAL DATA:  Shortness of breath and elevated D-dimer. Clinical suspicion for pulmonary embolism. EXAM: CT ANGIOGRAPHY CHEST WITH CONTRAST TECHNIQUE: Multidetector CT imaging of the chest was performed using the standard protocol during bolus administration of intravenous contrast. Multiplanar CT image reconstructions and MIPs were obtained to evaluate the vascular anatomy. CONTRAST:  17mL OMNIPAQUE IOHEXOL 350 MG/ML SOLN COMPARISON:  02/16/2020 FINDINGS: Cardiovascular: 2 small filling defects are seen within subsegmental pulmonary artery branches in the anterior right middle lobe (image 178/6), and in the posterior right lower lobe (image 195/6). These are new since previous study of 02/16/2020, and may be represent acute or subacute pulmonary emboli. No other sites of pulmonary embolism identified. No evidence of thoracic aortic dissection or aneurysm. Mediastinum/Nodes: Stable mild  mediastinal and hilar lymphadenopathy with central calcifications, consistent with old granulomatous disease. No new or increased sites of lymphadenopathy identified. Lungs/Pleura: Moderate to severe centrilobular emphysema is seen. New focal ill-defined pulmonary opacity is seen in the anterior right lung apex, likely infectious or inflammatory etiology with neoplasm considered less likely. Dependent atelectasis in both lower lobes has increased since prior exam. No evidence of pleural effusion. Upper abdomen: No acute findings. Musculoskeletal: No suspicious bone lesions identified. Review of the MIP images confirms the above findings. IMPRESSION: 2 small subsegmental pulmonary emboli in right middle and lower lobes which are new since 02/16/2020 exam, and may be acute or subacute in age. New focal ill-defined pulmonary opacity in anterior right lung apex, likely infectious or inflammatory etiology with neoplasm considered less likely. Recommend continued follow-up by CT in 2-3 months. Increased dependent atelectasis in both lower lobes. Stable mild mediastinal and hilar lymphadenopathy, consistent with old granulomatous disease. Emphysema (ICD10-J43.9). Electronically Signed   By: Danae Orleans M.D.   On: 05/29/2020 20:49   CT Abdomen Pelvis W Contrast  Result Date: 05/29/2020 CLINICAL DATA:  Abdominal pain. Suprapubic abdominal pain. History of prior nephrectomy. EXAM: CT ABDOMEN AND PELVIS WITH CONTRAST TECHNIQUE: Multidetector CT imaging of the abdomen and pelvis was performed using the standard protocol following bolus administration of intravenous contrast. CONTRAST:  OMNIPAQUE IOHEXOL 300 MG/ML  SOLN COMPARISON:  CT dated 07/14/2012 FINDINGS: Lower chest: There is atelectasis at the lung bases.The heart size appears to be mildly enlarged. There is suggestion of emphysematous changes at the lung bases. Hepatobiliary: The liver is normal. Normal gallbladder.There is no biliary ductal dilation.  Pancreas: Normal contours without ductal dilatation. No peripancreatic fluid collection. Spleen: Unremarkable. Adrenals/Urinary Tract: --Adrenal glands: Unremarkable. --Right kidney/ureter: The right adrenal gland is surgically absent. --Left kidney/ureter: There is left-sided mild to moderate hydroureteronephrosis secondary to an obstructing set of stones in the proximal left ureter. The largest stone measures up to approximately 7 mm (axial series 2, image 46). There are additional punctate nonobstructing stones throughout the left kidney. There are multiple small cystic appearing lesions involving the left kidney some of these measure above water density and therefore are indeterminate on this  study. --Urinary bladder: Multiple small bladder stones/gravel are noted in the patient's urinary bladder. The urinary bladder is mildly distended. Stomach/Bowel: --Stomach/Duodenum: No hiatal hernia or other gastric abnormality. Normal duodenal course and caliber. --Small bowel: Unremarkable. --Colon: Rectosigmoid diverticulosis without acute inflammation. --Appendix: Not visualized. No right lower quadrant inflammation or free fluid. Vascular/Lymphatic: Atherosclerotic calcification is present within the non-aneurysmal abdominal aorta, without hemodynamically significant stenosis. --No retroperitoneal lymphadenopathy. --No mesenteric lymphadenopathy. --No pelvic or inguinal lymphadenopathy. Reproductive: There is a penile prosthesis in place. Other: No ascites or free air. The abdominal wall is normal. Musculoskeletal. No acute displaced fractures. IMPRESSION: 1. Left-sided mild to moderate hydroureteronephrosis secondary to an obstructing set of stones in the proximal left ureter. The largest stone measures up to approximately 7 mm. 2. Multiple small bladder stones/gravel are noted. 3. Rectosigmoid diverticulosis without acute inflammation. 4. There are multiple small cystic appearing lesions involving the left kidney  some of these measure above water density and therefore are indeterminate on this study. Follow-up with a nonemergent outpatient renal ultrasound is recommended. 5. Status post right-sided nephrectomy. Aortic Atherosclerosis (ICD10-I70.0). Electronically Signed   By: Constance Holster M.D.   On: 05/29/2020 19:22   US RENAL  Result Date: 05/31/2020 CLINICAL DATA:  Left renal cyst. Status post right nephrectomy. Status post left ureteral stent placement EXAM: RENAL / URINARY TRACT ULTRASOUND COMPLETE COMPARISON:  CT dated May 29, 2020 FINDINGS: Right Kidney: The patient is status post right-sided nephrectomy. Left Kidney: Renal measurements: 13.8 x 6.8 x 5.9 cm = volume: 289 mL. There is no significant collecting system dilatation. A small 1.1 cm cyst is noted arising from the lower pole. Bladder: There is layering debris within the urinary bladder. The left ureteral stent is identified. The left ureteral jet is noted. Other: None. IMPRESSION: 1. No definite left-sided hydronephrosis. 2. There is debris within the urinary bladder which is nonspecific and should be correlated with urinalysis. 3. The left-sided ureteral stent is noted extending into the urinary bladder. 4. A simple appearing cyst is noted arising from the lower pole. No concerning renal lesion was identified on this study. 5. Status post right-sided nephrectomy. Electronically Signed   By: Constance Holster M.D.   On: 05/31/2020 17:33   DG Chest Portable 1 View  Result Date: 05/29/2020 CLINICAL DATA:  New oxygen requirement. EXAM: PORTABLE CHEST 1 VIEW COMPARISON:  Chest radiographs 02/13/2020 and CTA 02/16/2020 FINDINGS: Telemetry leads overlie the chest. The cardiomediastinal silhouette is unchanged with normal heart size. Aortic atherosclerosis is noted. Lung volumes are lower than on the prior study. Chronic interstitial coarsening is similar to the prior radiographs with emphysema shown on CT. There may be a trace left pleural  effusion. No pneumothorax is identified. No acute osseous abnormality is seen. IMPRESSION: Chronic lung changes without evidence of pneumonia or edema. Possible trace left pleural effusion. Electronically Signed   By: Logan Bores M.D.   On: 05/29/2020 15:18   DG C-Arm 1-60 Min-No Report  Result Date: 05/30/2020 Fluoroscopy was utilized by the requesting physician.  No radiographic interpretation.   ECHOCARDIOGRAM COMPLETE  Result Date: 05/30/2020    ECHOCARDIOGRAM REPORT   Patient Name:   ROWDY BELGARDE [facility] Date of Exam: 05/30/2020 Medical Rec #:  [id number]      Height:       73.0 in Accession #:    [id number]     Weight:       180.0 lb Date of Birth:  [birth year]     BSA:  2.057 m Patient Age:    79 years       BP:           128/77 mmHg Patient Gender: M              HR:           73 bpm. Exam Location:  [facility] Procedure: 2D Echo, Cardiac Doppler and Color Doppler Indications:    Pulmonary Embolus 415.19 / I26.99  History:        Patient has no prior history of Echocardiogram examinations.                 COPD; Risk Factors:Dyslipidemia. ALS (amyotrophic lateral                 sclerosis).  Sonographer:    Alvino Chapel RCS Referring Phys: [id number] [place]  1. Left ventricular ejection fraction, by estimation, is 60 to 65%. The left ventricle has normal function. The left ventricle has no regional wall motion abnormalities. Left ventricular diastolic parameters are indeterminate.  2. Right ventricular systolic function is normal. The right ventricular size is mildly enlarged. Tricuspid regurgitation signal is inadequate for assessing PA pressure.  3. A small pericardial effusion is present. The pericardial effusion is anterior to the right ventricle.  4. The mitral valve is grossly normal. Trivial mitral valve regurgitation. Moderate mitral annular calcification.  5. The aortic valve is tricuspid. Aortic valve regurgitation is trivial. Mild to moderate aortic valve  sclerosis/calcification is present, without any evidence of aortic stenosis.  6. The inferior vena cava is normal in size with <50% respiratory variability, suggesting right atrial pressure of 8 mmHg. FINDINGS  Left Ventricle: Left ventricular ejection fraction, by estimation, is 60 to 65%. The left ventricle has normal function. The left ventricle has no regional wall motion abnormalities. The left ventricular internal cavity size was normal in size. There is  borderline left ventricular hypertrophy. Left ventricular diastolic parameters are indeterminate. Right Ventricle: The right ventricular size is mildly enlarged. No increase in right ventricular wall thickness. Right ventricular systolic function is normal. Tricuspid regurgitation signal is inadequate for assessing PA pressure. Left Atrium: Left atrial size was normal in size. Right Atrium: Right atrial size was normal in size. Pericardium: A small pericardial effusion is present. The pericardial effusion is anterior to the right ventricle. Mitral Valve: The mitral valve is grossly normal. Moderate mitral annular calcification. Trivial mitral valve regurgitation. Tricuspid Valve: The tricuspid valve is grossly normal. Tricuspid valve regurgitation is trivial. Aortic Valve: The aortic valve is tricuspid. There is mild aortic valve annular calcification. Aortic valve regurgitation is trivial. Mild to moderate aortic valve sclerosis/calcification is present, without any evidence of aortic stenosis. Pulmonic Valve: The pulmonic valve was not well visualized. Pulmonic valve regurgitation is trivial. Aorta: The aortic root is normal in size and structure. Venous: The inferior vena cava is normal in size with less than 50% respiratory variability, suggesting right atrial pressure of 8 mmHg. IAS/Shunts: No atrial level shunt detected by color flow Doppler.  LEFT VENTRICLE PLAX 2D LVIDd:         4.80 cm  Diastology LVIDs:         2.70 cm  LV e' medial:    6.09 cm/s LV  PW:         0.95 cm  LV E/e' medial:  8.2 LV IVS:        0.95 cm  LV e' lateral:   6.53 cm/s LVOT  diam:     1.80 cm  LV E/e' lateral: 7.7 LV SV:         59 LV SV Index:   29 LVOT Area:     2.54 cm  RIGHT VENTRICLE RV S prime:     13.50 cm/s TAPSE (M-mode): 2.1 cm LEFT ATRIUM             Index       RIGHT ATRIUM           Index LA diam:        3.70 cm 1.80 cm/m  RA Area:     16.20 cm LA Vol (A2C):   71.9 ml 34.95 ml/m RA Volume:   37.80 ml  18.37 ml/m LA Vol (A4C):   56.1 ml 27.27 ml/m LA Biplane Vol: 67.0 ml 32.57 ml/m  AORTIC VALVE LVOT Vmax:   108.00 cm/s LVOT Vmean:  74.300 cm/s LVOT VTI:    0.233 m  AORTA Ao Root diam: 3.50 cm MITRAL VALVE MV Area (PHT): 1.76 cm    SHUNTS MV Decel Time: 430 msec    Systemic VTI:  0.23 m MV E velocity: 50.10 cm/s  Systemic Diam: 1.80 cm MV A velocity: 99.40 cm/s MV E/A ratio:  0.50 Rozann Lesches MD Electronically signed by Rozann Lesches MD Signature Date/Time: 05/30/2020/1:12:49 PM    Final     Microbiology: Recent Results (from the past 240 hour(s))  Urine culture     Status: Abnormal   Collection Time: 05/29/20  5:23 PM   Specimen: Urine, Random  Result Value Ref Range Status   Specimen Description   Final    URINE, RANDOM Performed at [facility], [address] [place], [place] [address]    Special Requests   Final    NONE Performed at [facility], [address] [place], [place] [address]    Culture (A)  Final    <10,000 COLONIES/mL INSIGNIFICANT GROWTH Performed at [facility] [address], [place], [place] [address]    Report Status 05/31/2020 FINAL  Final  Resp Panel by RT-PCR (Flu A&B, Covid) Nasopharyngeal Swab     Status: None   Collection Time: 05/29/20  8:43 PM   Specimen: Nasopharyngeal Swab; Nasopharyngeal(NP) swabs in vial transport medium  Result Value Ref Range Status   SARS Coronavirus 2 by RT PCR NEGATIVE NEGATIVE Final    Comment: (NOTE) SARS-CoV-2 target nucleic acids are NOT DETECTED.  The SARS-CoV-2 RNA  is generally detectable in upper respiratory specimens during the acute phase of infection. The lowest concentration of SARS-CoV-2 viral copies this assay can detect is 138 copies/mL. A negative result does not preclude SARS-Cov-2 infection and should not be used as the sole basis for treatment or other patient management decisions. A negative result may occur with  improper specimen collection/handling, submission of specimen other than nasopharyngeal swab, presence of viral mutation(s) within the areas targeted by this assay, and inadequate number of viral copies(<138 copies/mL). A negative result must be combined with clinical observations, patient history, and epidemiological information. The expected result is Negative.  Fact Sheet for Patients:  EntrepreneurPulse.com.au  Fact Sheet for Healthcare Providers:  IncredibleEmployment.be  This test is no t yet approved or cleared by the [place] FDA and  has been authorized for detection and/or diagnosis of SARS-CoV-2 by FDA under an Emergency Use Authorization (EUA). This EUA will remain  in effect (meaning this test can be used) for the duration of the COVID-19 declaration under Section 564(b)(1)  of the Act, 21 U.S.C.section 360bbb-3(b)(1), unless the authorization is terminated  or revoked sooner.       Influenza A by PCR NEGATIVE NEGATIVE Final   Influenza B by PCR NEGATIVE NEGATIVE Final    Comment: (NOTE) The Xpert Xpress SARS-CoV-2/FLU/RSV plus assay is intended as an aid in the diagnosis of influenza from Nasopharyngeal swab specimens and should not be used as a sole basis for treatment. Nasal washings and aspirates are unacceptable for Xpert Xpress SARS-CoV-2/FLU/RSV testing.  Fact Sheet for Patients: EntrepreneurPulse.com.au  Fact Sheet for Healthcare Providers: IncredibleEmployment.be  This test is not yet approved or cleared by the  [place] FDA and has been authorized for detection and/or diagnosis of SARS-CoV-2 by FDA under an Emergency Use Authorization (EUA). This EUA will remain in effect (meaning this test can be used) for the duration of the COVID-19 declaration under Section 564(b)(1) of the Act, 21 U.S.C. section 360bbb-3(b)(1), unless the authorization is terminated or revoked.  Performed at [facility], [address], [place], [place] [address]   Surgical PCR screen     Status: None   Collection Time: 05/30/20  3:02 PM   Specimen: Nasal Mucosa; Nasal Swab  Result Value Ref Range Status   MRSA, PCR NEGATIVE NEGATIVE Final   Staphylococcus aureus NEGATIVE NEGATIVE Final    Comment: (NOTE) The Xpert SA Assay (FDA approved for NASAL specimens in patients 63 years of age and older), is one component of a comprehensive surveillance program. It is not intended to diagnose infection nor to guide or monitor treatment. Performed at [facility], [place] [address] [place], [place] [address]      Labs: Basic Metabolic Panel: Recent Labs  Lab 05/29/20 1640 05/30/20 0359 05/31/20 0445 06/01/20 0426  NA 139 139 139 142  K 4.4 3.6 4.4 4.0  CL 105 106 104 107  CO2 24 23 22 25   GLUCOSE 122* 104* 150* 114*  BUN 23 19 25* 30*  CREATININE 1.15 0.95 1.04 0.99  CALCIUM 9.8 9.3 9.6 9.4  MG  --   --  2.1  --    Liver Function Tests: Recent Labs  Lab 05/29/20 1640  AST 29  ALT 25  ALKPHOS 67  BILITOT 0.7  PROT 6.6  ALBUMIN 3.4*   Recent Labs  Lab 05/29/20 1640  LIPASE 17   No results for input(s): AMMONIA in the last 168 hours. CBC: Recent Labs  Lab 05/29/20 1640 05/30/20 0359 05/31/20 0445 06/01/20 0426  WBC 10.0 9.1 6.4 10.9*  NEUTROABS 8.6*  --   --   --   HGB 12.2* 12.2* 12.6* 11.9*  HCT 40.1 40.2 41.5 40.5  MCV 90.5 90.1 88.5 91.8  PLT 202 211 197 189   Cardiac Enzymes: No results for input(s): CKTOTAL, CKMB, CKMBINDEX, TROPONINI in the last 168  hours. BNP: BNP (last 3 results) Recent Labs    05/29/20 1640  BNP 45.0    ProBNP (last 3 results) No results for input(s): PROBNP in the last 8760 hours.  CBG: No results for input(s): GLUCAP in the last 168 hours.     Signed:  Florencia Reasons MD, PhD, FACP  [facility] 06/01/2020, 12:08 PM

## 2020-06-03 ENCOUNTER — Encounter (HOSPITAL_COMMUNITY): Payer: Self-pay | Admitting: Urology

## 2020-06-03 NOTE — Anesthesia Postprocedure Evaluation (Signed)
Anesthesia Post Note  Patient: Jerome Mays  Procedure(s) Performed: CYSTOSCOPY URETERAL STENT PLACEMENT (Left )     Patient location during evaluation: PACU Anesthesia Type: General Level of consciousness: awake and alert Pain management: pain level controlled Vital Signs Assessment: post-procedure vital signs reviewed and stable Respiratory status: spontaneous breathing, nonlabored ventilation, respiratory function stable and patient connected to nasal cannula oxygen Cardiovascular status: blood pressure returned to baseline and stable Postop Assessment: no apparent nausea or vomiting Anesthetic complications: no   No complications documented.  Last Vitals:  Vitals:   06/01/20 0513 06/01/20 0901  BP: 131/81   Pulse: 62   Resp: 20   Temp: (!) 36.1 C   SpO2: 95% 92%    Last Pain:  Vitals:   06/01/20 1000  TempSrc:   PainSc: 0-No pain                 Gery Sabedra L Aubriel Khanna

## 2020-06-03 NOTE — Progress Notes (Signed)
Patient has appointment with Brian,NP scheduled for 06/12/20.

## 2020-06-04 ENCOUNTER — Other Ambulatory Visit: Payer: Self-pay | Admitting: *Deleted

## 2020-06-04 DIAGNOSIS — G1221 Amyotrophic lateral sclerosis: Secondary | ICD-10-CM | POA: Diagnosis not present

## 2020-06-04 NOTE — Patient Outreach (Signed)
[facility]) Care Management  [facility] Care Manager  06/04/2020   Jerome Mays Mar 03, 1941 [id number]   EMMI Alert Follow Up  Red on EMMI Alert--General Discharge EMMI Day:  #1 Date:  06/03/20 Red Alert Reason:  Know who to call about changes in condition? And  Questions about discharge papers?    Outreach Attempt:  Successful telephone outreach to patient for EMMI Red Alert follow up.  HIPAA verified with patient and patient request we speak with his wife.  Patient answered Know who to call about changes in condition ?no and Questions about discharge papers? Yes.  Per wife she did have questions and concerns for a provider and could not get anyone to answer at providers office.  She did call back to [facility] department to seek assistance and they got her in contact with someone at primary care providers office.  States patient has not had bowel movement since prior to admission to [facility] on 05/29/20.  Wife reports assistant at Dr. Lanell Matar office instructed her to purchase Magnesium Citrate and Senokot at the pharmacy to help patient move bowels.  Wife reports she now knows to contact primary care for questions or concerns.  Asked about follow up appointment and wife states she has not made appointment with primary care as of yet.  RN Care Coordinator contacted primary care office to assist with arranging post [facility] follow up appointment and left message with Jerome Mays (per secretary she is the only one at this time that can arrange follow up appointment for Dr. Jacky Mays).  Reviewed discharged papers with wife and discharge medications.  Encouraged wife to take discharge papers as well as pill bottles to follow up appointment with primary care provider.  Reviewed and discussed [facility] services with wife.  At this time wife would like to discuss program with primary care provider and does not consent to services at this time.  Does agree to have [facility] brochure mailed for review and  for follow up call and assistance with arranging post [facility] follow up appointment.   Plan: RN Care Coordinator will await return call back from [facility] to arrange post [facility] follow up appointment. RN Care Coordinator will send patient and wife Jerome [facility] Successful Letter with brochure. RN Care Coordinator will make another telephone outreach to patient and wife next week per wife's request.  Jerome Lerner RN [facility] Care Management  RN Telephonic Care Coordinator [phone] [name]

## 2020-06-05 ENCOUNTER — Encounter (INDEPENDENT_AMBULATORY_CARE_PROVIDER_SITE_OTHER): Payer: Medicare HMO | Admitting: Ophthalmology

## 2020-06-06 ENCOUNTER — Other Ambulatory Visit: Payer: Self-pay | Admitting: *Deleted

## 2020-06-06 NOTE — Patient Outreach (Signed)
[facility]) Care Management  06/06/2020  Jerome Mays 04-Sep-1940 [id number]   EMMI Alert Follow Up  Red on EMMI Alert--General Discharge EMMI Day:  #1 Date:  06/03/20 Red Alert Reason:  Know who to call about changes in condition? And  Questions about discharge papers?   Outreach Attempt:   Have not received call back from primary care to arrange post hospital follow up appointment.  PCP office contacted again this morning and message left with Brewing technologist.  Was told by secretary answering, they are not able to make hospital follow up appointments.  Attempted to contact patient's wife, she was not home to update.  Plan:  RN Care Coordinator will await return call from primary care office and update wife when appointment is able to be made.   Rhae Lerner RN [facility] Care Management  RN Telephonic Care Coordinator [phone] [email or web address]

## 2020-06-10 ENCOUNTER — Other Ambulatory Visit: Payer: Self-pay | Admitting: *Deleted

## 2020-06-10 ENCOUNTER — Encounter: Payer: Self-pay | Admitting: *Deleted

## 2020-06-10 NOTE — Patient Outreach (Signed)
[facility]) Care Management  [place]  06/10/2020   Jerome Mays 11-Nov-1940 [id number]   RN Telephonic Care Coordinator Initial Assessment  Referral Date:  06/03/2020 Referral Source:  EMMI Red Alert  Reason for Referral:  EMMI Red Alert General Discharge Insurance:  [employer] Medicare   Outreach Attempt:  Successful telephone outreach to patients wife for post EMMI red alert follow up and primary care follow up appointment.  HIPAA verified with wife.  Wife reports she has still been unable to schedule follow up appointments with primary care and urology.  RN Care Coordinator did receive call back/message from DJ, clinical manager at Dr. Jacquiline Doe office last Thursday prior to holiday.  Outreached to DJ and left message as well as leaving message for Dr. Jacquiline Doe primary nurse.  RN Care Coordinator was able to schedule follow up appointment with urologist, Dr. Milford Cage on 06/17/2020 at 2:45.  Wife updated and appreciative.  [facility] services reviewed and discussed and patient and wife agree to services.  Initial telephone assessment completed with wife and patient.   Social: Patient lives at home with wife.  Patient has very limited movement of upper extremities due to ALS diagnosis, making him dependent with ADLs, wife assist with feeding and does bathing and dressing; and wife completes all IADLs.  Patient is able to ambulate and reports one fall without injury in the last year where he tripped over blanket around his feet.  Wife transports to medical appointments and states family assist with out of town appointments.  Asking about transportation assistance to his [facility] appointments, stating she drives to [facility] and daughter then takes over.  Discussed [facility] Social Work referral for transportation options and wife declines at this times, stating "we will se what happens in the future".  DME in the home include:  Lift chair, straight cane, rolling walker, home oxygen, grab  bar around toilet, eyeglasses, and scale.  Conditions:  Per chart review and discussion with patient and family, PMH include but not limited to:  amyotrophic lateral sclerosis (ALS), anemia, arthritis, constipation, COPD, GERD, gout, kidney stones, renal tumor with nephrectomy, hypercholesterolemia, and back surgery.  Recent hospitalization for renal obstruction with kidney stones and renal stent placement; also diagnosed with pulmonary emboli with discharge on 06/01/2020.  On Eliquis and wife does report some hematuria, unchanged since discharge from hospital.  Does report urinary frequency, encouraged to continue to monitor for worsening hematuria and discuss at urology follow up.  Wife reports constipation resolved from last week after magnesium citrate and last bowel movement this morning.  Was sent home on home oxygen at discharge on 12/25.  Wife states patient's breathing has been fine and he has denied shortness of breath even without the oxygen.  States he is able to ambulate around without need for oxygen at this time.  Medications:  Reports taking about 9 medications daily.  States he has not resumed his daily vitamins but is compliant with all other medications.  Wife manages medications.  Denies any difficulties affording medications at this time.  Encounter Medications:  Outpatient Encounter Medications as of 06/10/2020  Medication Sig  . allopurinol (ZYLOPRIM) 300 MG tablet Take 300 mg by mouth daily.  [name] apixaban (ELIQUIS) 5 MG TABS tablet Take 10mg  bid for 7 days then 5mg  bid  . Fluticasone-Umeclidin-Vilant (TRELEGY ELLIPTA) 200-62.5-25 MCG/INH AEPB Inhale 1 puff into the lungs daily.  . hydrOXYzine (ATARAX/VISTARIL) 10 MG tablet Take 10 mg by mouth at bedtime.  [name] ketorolac (ACULAR) 0.5 %  ophthalmic solution Place 1 drop into the left eye 4 (four) times daily.  . metoprolol tartrate (LOPRESSOR) 50 MG tablet TAKE 1 TABLET 2 HOURS PRIOR TO CT  Please verify with your primary care doctor  regarding this medication  . pantoprazole (PROTONIX) 40 MG tablet Take 1 tablet (40 mg total) by mouth daily.  . prednisoLONE acetate (PRED FORTE) 1 % ophthalmic suspension Place 1 drop into the left eye 4 (four) times daily.  . riluzole (RILUTEK) 50 MG tablet Take 1 tablet (50 mg total) by mouth every 12 (twelve) hours.  [name] b complex vitamins tablet Take 1 tablet by mouth daily.  . B Complex-C (B-COMPLEX WITH VITAMIN C) tablet Take 1 tablet by mouth daily.  . cetirizine (ZYRTEC) 10 MG tablet Take 10 mg by mouth daily.  . cholecalciferol (VITAMIN D3) 25 MCG (1000 UT) tablet Take 1,000 Units by mouth daily.  . clobetasol cream (TEMOVATE) 0.05 % Apply 1 application topically 2 (two) times daily.  . Multiple Vitamin (ONE-A-DAY MENS PO) Take 1 tablet by mouth daily.  . prednisoLONE acetate (PRED FORTE) 1 % ophthalmic suspension Place 1 drop into the left eye 4 (four) times daily. (Patient not taking: No sig reported)  . sodium chloride (MURO 128) 5 % ophthalmic solution Place 1 drop into the left eye in the morning, at noon, in the evening, and at bedtime. (Patient not taking: No sig reported)   No facility-administered encounter medications on file as of 06/10/2020.    Functional Status:  In your present state of health, do you have any difficulty performing the following activities: 05/30/2020 05/30/2020  Hearing? - N  Vision? 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 - N  Walking or climbing stairs? - Y  Comment - secondary to ALS  Dressing or bathing? - Y  Doing errands, shopping? N -  Some recent data might be hidden    Fall/Depression Screening: No flowsheet data found. PHQ 2/9 Scores 06/10/2020  PHQ - 2 Score 0    Goals Addressed            This Visit's Progress   . [facility]) Learn More About My Health       Timeframe:  Long-Range Goal Priority:  Medium Start Date: 06/10/2020                            Expected End Date:  10/05/20                       Follow Up Date 06/24/2020    - tell my story and reason for my visit - make a list of questions - ask questions - repeat what I heard to make sure I understand - bring a list of my medicines to the visit - speak up when I don't understand  -continue to monitor urine for hematuria and notify provider for change or worsening -continue to monitor for shortness of breath or hemoptysis    Why is this important?    The best way to learn about your health and care is by talking to the doctor and nurse.   They will answer your questions and give you information in the way that you like best.    Notes:     . [facility]) Make and Keep All Appointments       Timeframe:  Short-Term Goal Priority:  High Start Date:  06/10/20                           Expected End Date:  07/08/20                     Follow Up Date 06/24/20    - ask family or friend for a ride - call to cancel if needed - keep a calendar with appointment dates  -request assistance from family for transportation to out of town appointments -schedule post hospital follow up with PCP -attend scheduled post hospital appointment with urologist on 06/17/2020 at 2:45   Why is this important?    Part of staying healthy is seeing the doctor for follow-up care.   If you forget your appointments, there are some things you can do to stay on track.    Notes:       Appointments:  Has scheduled appointment with Dr. Benancio Deeds, Urologist on 06/17/2020 at 2:45.  Still awaiting call from primary care provider to schedule hospital follow up.  Has scheduled follow up with [facility] for ALS diagnosis on 07/02/2020.  Advanced Directives:  Reports having Living Will and Healthcare Power of Attorney in place and does not wish to make any changes at this time.   Consent: [facility] services reviewed and discussed.  Wife verbally agrees to [facility] and follow ups.  Plan: Follow-up:  Patient agrees to Care Plan and Follow-up. RN Care Coordinator will  send primary MD barriers letter. RN Care Coordinator will route initial telephone assessment note to primary MD. RN Care Coordinator will send patient [facility] Welcome Packet. RN Care Coordinator will send patient 2022 [facility] Calendar Booklet. RN Care Coordinator will send patient Education on Pulmonary Emboli. RN Care Coordinator will send patient Education on Renal Stones. RN Care Coordinator will make next telephone outreach to patient/wife within the next 2 weeks and wife agrees to future outreach.  Rhae Lerner RN [facility] Care Management  RN Telephonic Care Coordinator [phone] [email or web address]

## 2020-06-10 NOTE — Patient Instructions (Addendum)
Urology Appointment with Dr. Milford Cage on 06/17/2020 at 2:45 pm  Goals Addressed            This Visit's Progress   . [facility]) Learn More About My Health       Timeframe:  Long-Range Goal Priority:  Medium Start Date: 06/10/2020                            Expected End Date:  10/05/20                      Follow Up Date 06/24/2020    - tell my story and reason for my visit - make a list of questions - ask questions - repeat what I heard to make sure I understand - bring a list of my medicines to the visit - speak up when I don't understand  -continue to monitor urine for hematuria and notify provider for change or worsening -continue to monitor for shortness of breath or hemoptysis    Why is this important?    The best way to learn about your health and care is by talking to the doctor and nurse.   They will answer your questions and give you information in the way that you like best.    Notes:     . [facility]) Make and Keep All Appointments       Timeframe:  Short-Term Goal Priority:  High Start Date:  06/10/20                           Expected End Date:  07/08/20                     Follow Up Date 06/24/20    - ask family or friend for a ride - call to cancel if needed - keep a calendar with appointment dates  -request assistance from family for transportation to out of town appointments -schedule post hospital follow up with PCP -attend scheduled post hospital appointment with urologist on 06/17/2020 at 2:45   Why is this important?    Part of staying healthy is seeing the doctor for follow-up care.   If you forget your appointments, there are some things you can do to stay on track.    Notes:        Pulmonary Embolism  A pulmonary embolism (PE) is a sudden blockage or decrease of blood flow in one or both lungs. Most blockages come from a blood clot that forms in the vein of a lower leg, thigh, or arm (deep vein thrombosis, DVT) and travels to the lungs. A clot is blood  that has thickened into a gel or solid. PE is a dangerous and life-threatening condition that needs to be treated right away. What are the causes? This condition is usually caused by a blood clot that forms in a vein and moves to the lungs. In rare cases, it may be caused by air, fat, part of a tumor, or other tissue that moves through the veins and into the lungs. What increases the risk? The following factors may make you more likely to develop this condition:  Experiencing a traumatic injury, such as breaking a hip or leg.  Having: ? A spinal cord injury. ? Orthopedic surgery, especially hip or knee replacement. ? Any major surgery. ? A stroke. ? DVT. ? Blood clots or blood clotting disease. ?  Long-term (chronic) lung or heart disease. ? Cancer treated with chemotherapy. ? A central venous catheter.  Taking medicines that contain estrogen. These include birth control pills and hormone replacement therapy.  Being: ? Pregnant. ? In the period of time after your baby is delivered (postpartum). ? Older than age 61. ? Overweight. ? A smoker, especially if you have other risks. What are the signs or symptoms? Symptoms of this condition usually start suddenly and include:  Shortness of breath during activity or at rest.  Coughing, coughing up blood, or coughing up blood-tinged mucus.  Chest pain that is often worse with deep breaths.  Rapid or irregular heartbeat.  Feeling light-headed or dizzy.  Fainting.  Feeling anxious.  Fever.  Sweating.  Pain and swelling in a leg. This is a symptom of DVT, which can lead to PE. How is this diagnosed? This condition may be diagnosed based on:  Your medical history.  A physical exam.  Blood tests.  CT pulmonary angiogram. This test checks blood flow in and around your lungs.  Ventilation-perfusion scan, also called a lung VQ scan. This test measures air flow and blood flow to the lungs.  An ultrasound of the legs. How  is this treated? Treatment for this condition depends on many factors, such as the cause of your PE, your risk for bleeding or developing more clots, and other medical conditions you have. Treatment aims to remove, dissolve, or stop blood clots from forming or growing larger. Treatment may include:  Medicines, such as: ? Blood thinning medicines (anticoagulants) to stop clots from forming. ? Medicines that dissolve clots (thrombolytics).  Procedures, such as: ? Using a flexible tube to remove a blood clot (embolectomy) or to deliver medicine to destroy it (catheter-directed thrombolysis). ? Inserting a filter into a large vein that carries blood to the heart (inferior vena cava). This filter (vena cava filter) catches blood clots before they reach the lungs. ? Surgery to remove the clot (surgical embolectomy). This is rare. You may need a combination of immediate, long-term (up to 3 months after diagnosis), and extended (more than 3 months after diagnosis) treatments. Your treatment may continue for several months (maintenance therapy). You and your health care provider will work together to choose the treatment program that is best for you. Follow these instructions at home: Medicines  Take over-the-counter and prescription medicines only as told by your health care provider.  If you are taking an anticoagulant medicine: ? Take the medicine every day at the same time each day. ? Understand what foods and drugs interact with your medicine. ? Understand the side effects of this medicine, including excessive bruising or bleeding. Ask your health care provider or pharmacist about other side effects. General instructions  Wear a medical alert bracelet or carry a medical alert card that says you have had a PE and lists what medicines you take.  Ask your health care provider when you may return to your normal activities. Avoid sitting or lying for a long time without moving.  Maintain a healthy  weight. Ask your health care provider what weight is healthy for you.  Do not use any products that contain nicotine or tobacco, such as cigarettes, e-cigarettes, and chewing tobacco. If you need help quitting, ask your health care provider.  Talk with your health care provider about any travel plans. It is important to make sure that you are still able to take your medicine while on trips.  Keep all follow-up visits as told by  your health care provider. This is important. Contact a health care provider if:  You missed a dose of your blood thinner medicine. Get help right away if:  You have: ? New or increased pain, swelling, warmth, or redness in an arm or leg. ? Numbness or tingling in an arm or leg. ? Shortness of breath during activity or at rest. ? A fever. ? Chest pain. ? A rapid or irregular heartbeat. ? A severe headache. ? Vision changes. ? A serious fall or accident, or you hit your head. ? Stomach (abdominal) pain. ? Blood in your vomit, stool, or urine. ? A cut that will not stop bleeding.  You cough up blood.  You feel light-headed or dizzy.  You cannot move your arms or legs.  You are confused or have memory loss. These symptoms may represent a serious problem that is an emergency. Do not wait to see if the symptoms will go away. Get medical help right away. Call your local emergency services (911 in the [place] Do not drive yourself to the hospital. Summary  A pulmonary embolism (PE) is a sudden blockage or decrease of blood flow in one or both lungs. PE is a dangerous and life-threatening condition that needs to be treated right away.  Treatments for this condition usually include medicines to thin your blood (anticoagulants) or medicines to break apart blood clots (thrombolytics).  If you are given blood thinners, it is important to take the medicine every day at the same time each day.  Understand what foods and drugs interact with any medicines that you are  taking.  If you have signs of PE or DVT, call your local emergency services (911 in the [place] This information is not intended to replace advice given to you by your health care provider. Make sure you discuss any questions you have with your health care provider. Document Revised: 03/02/2018 Document Reviewed: 03/02/2018 Elsevier Patient Education  2020 Elsevier Inc.  Kidney Stones Kidney stones are rock-like masses that form inside of the kidneys. Kidneys are organs that make pee (urine). A kidney stone may move into other parts of the urinary tract, including:  The tubes that connect the kidneys to the bladder (ureters).  The bladder.  The tube that carries urine out of the body (urethra). Kidney stones can cause very bad pain and can block the flow of pee. The stone usually leaves your body (passes) through your pee. You may need to have a doctor take out the stone. What are the causes? Kidney stones may be caused by:  A condition in which certain glands make too much parathyroid hormone (primary hyperparathyroidism).  A buildup of a type of crystals in the bladder made of a chemical called uric acid. The body makes uric acid when you eat certain foods.  Narrowing (stricture) of one or both of the ureters.  A kidney blockage that you were born with.  Past surgery on the kidney or the ureters, such as gastric bypass surgery. What increases the risk? You are more likely to develop this condition if:  You have had a kidney stone in the past.  You have a family history of kidney stones.  You do not drink enough water.  You eat a diet that is high in protein, salt (sodium), or sugar.  You are overweight or very overweight (obese). What are the signs or symptoms? Symptoms of a kidney stone may include:  Pain in the side of the belly, right below the ribs (  flank pain). Pain usually spreads (radiates) to the groin.  Needing to pee often or right away (urgently).  Pain  when going pee (urinating).  Blood in your pee (hematuria).  Feeling like you may vomit (nauseous).  Vomiting.  Fever and chills. How is this treated? Treatment depends on the size, location, and makeup of the kidney stones. The stones will often pass out of the body through peeing. You may need to:  Drink more fluid to help pass the stone. In some cases, you may be given fluids through an IV tube put into one of your veins at the hospital.  Take medicine for pain.  Make changes in your diet to help keep kidney stones from coming back. Sometimes, medical procedures are needed to remove a kidney stone. This may involve:  A procedure to break up kidney stones using a beam of light (laser) or shock waves.  Surgery to remove the kidney stones. Follow these instructions at home: Medicines  Take over-the-counter and prescription medicines only as told by your doctor.  Ask your doctor if the medicine prescribed to you requires you to avoid driving or using heavy machinery. Eating and drinking  Drink enough fluid to keep your pee pale yellow. You may be told to drink at least 8-10 glasses of water each day. This will help you pass the stone.  If told by your doctor, change your diet. This may include: ? Limiting how much salt you eat. ? Eating more fruits and vegetables. ? Limiting how much meat, poultry, fish, and eggs you eat.  Follow instructions from your doctor about eating or drinking restrictions. General instructions  Collect pee samples as told by your doctor. You may need to collect a pee sample: ? 24 hours after a stone comes out. ? 8-12 weeks after a stone comes out, and every 6-12 months after that.  Strain your pee every time you pee (urinate), for as long as told. Use the strainer that your doctor recommends.  Do not throw out the stone. Keep it so that it can be tested by your doctor.  Keep all follow-up visits as told by your doctor. This is important. You may  need follow-up tests. How is this prevented? To prevent another kidney stone:  Drink enough fluid to keep your pee pale yellow. This is the best way to prevent kidney stones.  Eat healthy foods.  Avoid certain foods as told by your doctor. You may be told to eat less protein.  Stay at a healthy weight. Where to find more information  [place] [facility]): www.kidney.[facility]): www.urologyhealth.org Contact a doctor if:  You have pain that gets worse or does not get better with medicine. Get help right away if:  You have a fever or chills.  You get very bad pain.  You get new pain in your belly (abdomen).  You pass out (faint).  You cannot pee. Summary  Kidney stones are rock-like masses that form inside of the kidneys.  Kidney stones can cause very bad pain and can block the flow of pee.  The stones will often pass out of the body through peeing.  Drink enough fluid to keep your pee pale yellow. This information is not intended to replace advice given to you by your health care provider. Make sure you discuss any questions you have with your health care provider. Document Revised: 10/11/2018 Document Reviewed: 10/11/2018 Elsevier Patient Education  Export.

## 2020-06-11 ENCOUNTER — Other Ambulatory Visit: Payer: Self-pay | Admitting: Pulmonary Disease

## 2020-06-12 ENCOUNTER — Encounter: Payer: Self-pay | Admitting: Pulmonary Disease

## 2020-06-12 ENCOUNTER — Ambulatory Visit: Payer: Medicare HMO | Admitting: Pulmonary Disease

## 2020-06-12 ENCOUNTER — Other Ambulatory Visit: Payer: Self-pay

## 2020-06-12 ENCOUNTER — Telehealth: Payer: Self-pay | Admitting: Pulmonary Disease

## 2020-06-12 ENCOUNTER — Other Ambulatory Visit: Payer: Self-pay | Admitting: *Deleted

## 2020-06-12 VITALS — BP 104/62 | HR 68 | Temp 97.0°F | Ht 73.0 in | Wt 172.4 lb

## 2020-06-12 DIAGNOSIS — J9611 Chronic respiratory failure with hypoxia: Secondary | ICD-10-CM

## 2020-06-12 DIAGNOSIS — N200 Calculus of kidney: Secondary | ICD-10-CM

## 2020-06-12 DIAGNOSIS — J449 Chronic obstructive pulmonary disease, unspecified: Secondary | ICD-10-CM

## 2020-06-12 DIAGNOSIS — N1330 Unspecified hydronephrosis: Secondary | ICD-10-CM

## 2020-06-12 DIAGNOSIS — G1221 Amyotrophic lateral sclerosis: Secondary | ICD-10-CM | POA: Diagnosis not present

## 2020-06-12 DIAGNOSIS — I2699 Other pulmonary embolism without acute cor pulmonale: Secondary | ICD-10-CM

## 2020-06-12 MED ORDER — TRELEGY ELLIPTA 200-62.5-25 MCG/INH IN AEPB: 1.0000 | INHALATION_SPRAY | Freq: Every day | RESPIRATORY_TRACT | 0 refills | Status: DC

## 2020-06-12 MED ORDER — TRELEGY ELLIPTA 100-62.5-25 MCG/INH IN AEPB: 1.0000 | INHALATION_SPRAY | Freq: Every day | RESPIRATORY_TRACT | 0 refills | Status: DC

## 2020-06-12 NOTE — Patient Instructions (Addendum)
You were seen today by Coral Ceo, NP  for:   1. COPD with chronic bronchitis (HCC)  Trelegy Ellipta  >>> 1 puff daily in the morning >>>rinse mouth out after use  >>> This inhaler contains 3 medications that help manage her respiratory status, contact our office if you cannot afford this medication or unable to remain on this medication  Only use your albuterol as a rescue medication to be used if you can't catch your breath by resting or doing a relaxed purse lip breathing pattern.  - The less you use it, the better it will work when you need it. - Ok to use up to 2 puffs  every 4 hours if you must but call for immediate appointment if use goes up over your usual need - Don't leave home without it !!  (think of it like the spare tire for your car)   Note your daily symptoms > remember "red flags" for COPD:   >>>Increase in cough >>>increase in sputum production >>>increase in shortness of breath or activity  intolerance.   If you notice these symptoms, please call the office to be seen.    2. Other acute pulmonary embolism without acute cor pulmonale (HCC)  CT angio showed 2 small subsegmental pulmonary embolism symptoms and right middle and lower lobe  Discussed case with Dr. Isaiah Serge.  We feel that patient could proceed forward with surgery if he is able to complete at least 4 weeks of Eliquis treatment.  Then patient could have bridge with Lovenox.  With patient resuming Eliquis 24 to 48 hours status post surgical intervention by urology.  Continue Eliquis   We will typically plan on treatment with Eliquis for 6 months total  Would also consider referral to hematology given the fact that this VTE event would be considered unprovoked, as discussed in office visit  3. Chronic respiratory failure with hypoxia (HCC)  Walk today in office off oxygen  >>> Completed 1 full lap in office without oxygen without hypoxemia  Walk today in office for portable oxygen concentrator >>>  Required 2 L pulsed with physical exertion after completing first lap >>> Will place order for portable oxygen concentrator as discussed today  Continue oxygen therapy as prescribed  >>>maintain oxygen saturations greater than 88 percent  >>>if unable to maintain oxygen saturations please contact the office  >>>do not smoke with oxygen  >>>can use nasal saline gel or nasal saline rinses to moisturize nose if oxygen causes dryness  4. Hydroureteronephrosis 5. Recurrent nephrolithiasis  Complete follow-up with Dr. Benancio Deeds with [facility]  Please have that note routed to Dr. Isaiah Serge as well as Elisha Headland, FNP  6. ALS (amyotrophic lateral sclerosis) (HCC)  Continue to follow-up with [facility]   Follow Up:    Return in about 2 months (around 08/10/2020), or if symptoms worsen or fail to improve, for Follow up with Dr. Isaiah Serge.   Notification of test results are managed in the following manner: If there are  any recommendations or changes to the  plan of care discussed in office today,  we will contact you and let you know what they are. If you do not hear from [place], then your results are normal and you can view them through your  MyChart account , or a letter will be sent to you. Thank you again for trusting [place] with your care  - Thank you, [facility]    It is flu season:   >>> Best ways to protect  herself from the flu: Receive the yearly flu vaccine, practice good hand hygiene washing with soap and also using hand sanitizer when available, eat a nutritious meals, get adequate rest, hydrate appropriately       Please contact the office if your symptoms worsen or you have concerns that you are not improving.   Thank you for choosing [facility] for your healthcare, and for allowing [place] to partner with you on your healthcare journey. I am thankful to be able to provide care to you today.   Wyn Quaker FNP-C

## 2020-06-12 NOTE — Telephone Encounter (Signed)
Patient was given Trelegy 100 samples today, but is prescribed Trelegy 200.  Asked if they could just take 2 puffs instead of 1 but I declined, stating that the other two meds in the inhaler would also be doubled and that is not appropriate dosing.  Apologized for the mixup, left 2 samples of Trelegy 200 up front for pickup.  Pt's wife expressed understanding.  Nothing further needed at this time- will close encounter.

## 2020-06-12 NOTE — Assessment & Plan Note (Signed)
Incidental finding with a ER admission two subsegmental PEs No family history of VTE events Not considered provoked at this point in time  Discussion: Discussed case with Dr. Isaiah Serge.  Could consider surgical intervention for needed kidney stone removal after 4 weeks of Eliquis treatment.  Would need bridging with Lovenox.  Patient will need 6 months of DOAC treatment for first VTE event  Plan: Continue Eliquis Complete follow-up with [facility] Dr. Benancio Deeds next week Notify our office if and when surgery is scheduled, patient would require Lovenox bridging prior to surgical intervention Patient needs 4 weeks of Eliquis treatment before considering surgical intervention Plan on 6 months of DOAC treatment Referral to hematology

## 2020-06-12 NOTE — Assessment & Plan Note (Signed)
Patient reporting he needs kidney stone removal Has planned appointment with [facility] Dr. Benancio Deeds Currently having hematuria  Plan: Complete upcoming appointment with [facility] Dr. Benancio Deeds Have those records faxed to our office

## 2020-06-12 NOTE — Assessment & Plan Note (Signed)
Followed by [facility] On experimental trial drug - Theracurmin  Recent pulmonary function testing performed at [facility] did not show significant restrictive lung disease  Plan: Continue follow-up with [facility]

## 2020-06-12 NOTE — Progress Notes (Signed)
@Patient  ID: Jerome Mays, male    DOB: 1941-01-12, 80 y.o.   MRN: [id number]  No chief complaint on file.   Referring provider: [name], MD  HPI:  80 year old male former smoker followed in our office for COPD  PMH: ALS, hypercholesteremia Smoker/ Smoking History: Former smoker.  1997.  80-pack-year smoking history. Maintenance: Trelegy Ellipta Pt of: Dr. Vaughan Browner  06/12/2020  - Visit   80 year old male former smoker followed in our office for COPD.  Established with Dr. Vaughan Browner.  Last seen in October/2021.  Plan of care at that office visit was as follows: Continue Trelegy Ellipta inhaler, follow-up in 3 months, continue follow-up with [facility] for management of ALS, continue spirometry and FVC monitoring.  Since last being seen patient was hospitalized on 05/29/2020 and discharged on 06/01/2020.  Excerpt of that discharge summary is listed below:   Recommendations for Outpatient Follow-up:  1. F/u with PCP within a week  for hospital discharge follow up, repeat cbc/bmp at follow up, pcp to refer to outpatient palliative care 2. F/u with urology Dr Rosana Hoes for left kidney stone, left kidney cyst, bladder stones 3. Follow-up with pulmonology, he has an appointment on January 5, pulmonology to monitor home oxygen use/duration and repeat CT chest in 2 to 3 months for right upper lung opacity  4. Ambulatory outpatient PT referral order placed  New medication at discharge: Eliquis, Keflex Discontinue medication at discharge: Discontinue aspirin   Patient brought in a medication list from home, metoprolol taken prior to CT  listed on the list. His blood pressure is stable without any blood pressure medication in the hospital, instructed patient to follow-up with PCP to verify this home medication.   History of present illness:  Jerome Mays a 80 y.o.malewith medical history significant ofALS, COPD,HLD,CAD, GERD, gout, urolithiasis, RCC with right nephrectomy,  [facility] detachment [facility] to left flank pain associated with nausea/one episode of emesiswith minimal mucus content and recent decrease in appetite.   Is found to have an obstructing left proximal ureteral stone with left-sided hydroureteric nephrosis ,he is transferred to [facility] long for urology eval, he is also found to have PE  Hospital Course:  Principal Problem:   Pulmonary emboli [facility]) Active Problems:   Gout   COPD, group B, by GOLD 2017 classification ([place])   ALS (amyotrophic lateral sclerosis) ([place])   GERD (gastroesophageal reflux disease)   Hydroureteronephrosis   Mild protein malnutrition ([place])   AKI (acute kidney injury) ([place])   Left-sided hydroureteronephrosis/obstructing left proximal ureter stones/small bladder stones/gravel -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 Milford Cage on December 23,has some gross hematuria in the setting of anticoagulation, he received Rocephin in the hospital , discharged on Keflex for 3 more days , follow-up with PCP and urology , repeat CBC and BMP at hospital discharge follow-up -Follow-up with urology Dr. Bernette Mayers definite stone treatment  Left renal cyst: There are multiple small cystic appearing lesions involving the left kidney some of these measure above water density and therefore are indeterminate on this study  renal US "A simple appearing cyst is noted arising from the lower pole. No concerning renal lesion was identified on this study." Follow-up with urology  PE with acute hypoxic respiratory failure -CTA chest showed "2 small subsegmental pulmonary emboli in right middle and lower lobes which are new since 02/16/2020 exam, and may be acute or  subacute in age." -He was treated with heparin drip,transition to apixaban, discontinue asa -Echocardiogram LVEF 60 to  65%,right ventricular systolic function is normal, right ventricular size is mildly enlarged.Detailsplease see the full report -02 dropped to 86% on room air while walking with PT -Arrange home o2 arranged, f/u with PCP and pulmonology regarding duration of home O2  Incidental findings on CT scan need follow up on by pcp: -New focal ill-defined pulmonary opacityin anterior right lung apex, likely infectious or inflammatory etiology with neoplasm considered less likely. Recommend continued follow-up by CT in 2-3 months. -Follow-up with PCP and pulmonology regarding repeat CT scan   COPD, group B, by GOLD 2017 classification ([place]) Stable ,Continue Trelegy Ellipta. Supplemental oxygen as needed.   Gout Continue allopurinol.  ALS (amyotrophic lateral sclerosis) ([place]) Supportive care. Continue Rilutek 50 mg p.o. every 12 hours.  GERD (gastroesophageal reflux disease) Continue pantoprazole 40 mg p.o. daily.  Mild protein malnutrition ([place]) Consult nutritional services.  Patient presenting to office today reporting that he has been doing well since being discharged from the hospital.  He reports adherence to taking his Eliquis.  He denies any previous VTE events.  Denies any family history of any previous VTE events.  Denies significant surgery or long-term car ride or travel prior to presenting to the emergency room at [facility].  Patient denies significant shortness of breath on arrival to emergency room at [facility].  He is presenting to that office for kidney stone pain.  He needs a kidney stone surgically removed.  Has upcoming follow-up with [facility]rology [facility].    Patient reports adherence to his Trelegy Ellipta.  He reports that he needs this refilled.  He only has 2 days left of this.  He continues to follow-up with [facility] for ALS.  Patient walked in office today was able to complete 1 lap off of oxygen on room air before exertional  hypoxemia set in.  Patient also walked for POC qualification.  Patient qualified for POC and required 2 L pulsed with physical exertion to maintain oxygen saturations appropriately.   Questionaires / Pulmonary Flowsheets:   ACT:  No flowsheet data found.  MMRC: mMRC Dyspnea Scale mMRC Score  02/13/2020 3  10/31/2019 1    Epworth:  No flowsheet data found.  Tests:   Imaging CT scan 10/21/09- Moderate centrilobar emphysema. No pulmonary masses, opacities, asbestos disease or consolidation. Stable enlarged. Stable enlarged mediastinal and bilateral hilar lymph nodes unchanged for past 3.5 years compatible with benign or reactive process.  CT scan 06/19/16-emphysema, no consolidation or lung opacity. Mediastinal and bilateral hilar lymphadenopathy stable in size. Clustered left upper lobe nodules.  CT scan 07/01/17- stable subcentimeter pulmonary nodules, stable calcified mediastinal, hilar lymphadenopathy.  CT scan 07/25/2019-stable pulmonary nodules, calcified mediastinal, hilar lymphadenopathy  CTA 02/16/2020-no PE, emphysema, stable pulmonary nodules  PFTs  02/04/16 FVC 4.05 [9%), FEV1 2.40 (70%), F/F 59, TLC 101%, DLCO 35% Moderate obstructive defect with severe reduction in diffusion capacity.  09/12/2019 [[facility]] FVC 3.34 [78%], FEV1 1.89 [60%], F/F 57  01/23/2020 [[facility]] FVC 3.42 [80%], FEV1 1.72 [55%], F/F 50  Labs CBC 05/21/17-WBC 7.2, eosinophils 3.7%, absolute eos count 266 Blood allergy profile 05/21/17-IgE 22, RAST panel is negative.  FENO:  No results found for: NITRICOXIDE  PFT: PFT Results Latest Ref Rng & Units 02/04/2016  FVC-Pre L 4.05  FVC-Predicted Pre % 89  FVC-Post L 4.40  FVC-Predicted Post % 97  Pre FEV1/FVC % % 59  Post  FEV1/FCV % % 61  FEV1-Pre L 2.40  FEV1-Predicted Pre % 73  FEV1-Post L 2.68  DLCO uncorrected ml/min/mmHg 12.60  DLCO UNC% % 35  DLCO corrected ml/min/mmHg 13.15  DLCO COR %Predicted % 37  DLVA Predicted % 41  TLC L 7.58  TLC %  Predicted % 101  RV % Predicted % 106    WALK:  SIX MIN WALK 06/12/2020 02/13/2020 04/25/2018 05/21/2017  Supplimental Oxygen during Test? (L/min) Yes No No No  O2 Flow Rate 2 - - -  Type Continuous - - -  Tech Comments: pt was placed on 2 liters of oxygen for the walk. - - -    Imaging: CT Angio Chest PE W/Cm &/Or Wo Cm  Result Date: 05/29/2020 CLINICAL DATA:  Shortness of breath and elevated D-dimer. Clinical suspicion for pulmonary embolism. EXAM: CT ANGIOGRAPHY CHEST WITH CONTRAST TECHNIQUE: Multidetector CT imaging of the chest was performed using the standard protocol during bolus administration of intravenous contrast. Multiplanar CT image reconstructions and MIPs were obtained to evaluate the vascular anatomy. CONTRAST:  4mL OMNIPAQUE IOHEXOL 350 MG/ML SOLN COMPARISON:  02/16/2020 FINDINGS: Cardiovascular: 2 small filling defects are seen within subsegmental pulmonary artery branches in the anterior right middle lobe (image 178/6), and in the posterior right lower lobe (image 195/6). These are new since previous study of 02/16/2020, and may be represent acute or subacute pulmonary emboli. No other sites of pulmonary embolism identified. No evidence of thoracic aortic dissection or aneurysm. Mediastinum/Nodes: Stable mild mediastinal and hilar lymphadenopathy with central calcifications, consistent with old granulomatous disease. No new or increased sites of lymphadenopathy identified. Lungs/Pleura: Moderate to severe centrilobular emphysema is seen. New focal ill-defined pulmonary opacity is seen in the anterior right lung apex, likely infectious or inflammatory etiology with neoplasm considered less likely. Dependent atelectasis in both lower lobes has increased since prior exam. No evidence of pleural effusion. Upper abdomen: No acute findings. Musculoskeletal: No suspicious bone lesions identified. Review of the MIP images confirms the above findings. IMPRESSION: 2 small subsegmental  pulmonary emboli in right middle and lower lobes which are new since 02/16/2020 exam, and may be acute or subacute in age. New focal ill-defined pulmonary opacity in anterior right lung apex, likely infectious or inflammatory etiology with neoplasm considered less likely. Recommend continued follow-up by CT in 2-3 months. Increased dependent atelectasis in both lower lobes. Stable mild mediastinal and hilar lymphadenopathy, consistent with old granulomatous disease. Emphysema (ICD10-J43.9). Electronically Signed   By: Danae Orleans M.D.   On: 05/29/2020 20:49   CT Abdomen Pelvis W Contrast  Result Date: 05/29/2020 CLINICAL DATA:  Abdominal pain. Suprapubic abdominal pain. History of prior nephrectomy. EXAM: CT ABDOMEN AND PELVIS WITH CONTRAST TECHNIQUE: Multidetector CT imaging of the abdomen and pelvis was performed using the standard protocol following bolus administration of intravenous contrast. CONTRAST:  OMNIPAQUE IOHEXOL 300 MG/ML  SOLN COMPARISON:  CT dated 07/14/2012 FINDINGS: Lower chest: There is atelectasis at the lung bases.The heart size appears to be mildly enlarged. There is suggestion of emphysematous changes at the lung bases. Hepatobiliary: The liver is normal. Normal gallbladder.There is no biliary ductal dilation. Pancreas: Normal contours without ductal dilatation. No peripancreatic fluid collection. Spleen: Unremarkable. Adrenals/Urinary Tract: --Adrenal glands: Unremarkable. --Right kidney/ureter: The right adrenal gland is surgically absent. --Left kidney/ureter: There is left-sided mild to moderate hydroureteronephrosis secondary to an obstructing set of stones in the proximal left ureter. The largest stone measures up to approximately 7 mm (axial series 2, image 46). There are  additional punctate nonobstructing stones throughout the left kidney. There are multiple small cystic appearing lesions involving the left kidney some of these measure above water density and therefore are  indeterminate on this study. --Urinary bladder: Multiple small bladder stones/gravel are noted in the patient's urinary bladder. The urinary bladder is mildly distended. Stomach/Bowel: --Stomach/Duodenum: No hiatal hernia or other gastric abnormality. Normal duodenal course and caliber. --Small bowel: Unremarkable. --Colon: Rectosigmoid diverticulosis without acute inflammation. --Appendix: Not visualized. No right lower quadrant inflammation or free fluid. Vascular/Lymphatic: Atherosclerotic calcification is present within the non-aneurysmal abdominal aorta, without hemodynamically significant stenosis. --No retroperitoneal lymphadenopathy. --No mesenteric lymphadenopathy. --No pelvic or inguinal lymphadenopathy. Reproductive: There is a penile prosthesis in place. Other: No ascites or free air. The abdominal wall is normal. Musculoskeletal. No acute displaced fractures. IMPRESSION: 1. Left-sided mild to moderate hydroureteronephrosis secondary to an obstructing set of stones in the proximal left ureter. The largest stone measures up to approximately 7 mm. 2. Multiple small bladder stones/gravel are noted. 3. Rectosigmoid diverticulosis without acute inflammation. 4. There are multiple small cystic appearing lesions involving the left kidney some of these measure above water density and therefore are indeterminate on this study. Follow-up with a nonemergent outpatient renal ultrasound is recommended. 5. Status post right-sided nephrectomy. Aortic Atherosclerosis (ICD10-I70.0). Electronically Signed   By: Constance Holster M.D.   On: 05/29/2020 19:22   US RENAL  Result Date: 05/31/2020 CLINICAL DATA:  Left renal cyst. Status post right nephrectomy. Status post left ureteral stent placement EXAM: RENAL / URINARY TRACT ULTRASOUND COMPLETE COMPARISON:  CT dated May 29, 2020 FINDINGS: Right Kidney: The patient is status post right-sided nephrectomy. Left Kidney: Renal measurements: 13.8 x 6.8 x 5.9 cm =  volume: 289 mL. There is no significant collecting system dilatation. A small 1.1 cm cyst is noted arising from the lower pole. Bladder: There is layering debris within the urinary bladder. The left ureteral stent is identified. The left ureteral jet is noted. Other: None. IMPRESSION: 1. No definite left-sided hydronephrosis. 2. There is debris within the urinary bladder which is nonspecific and should be correlated with urinalysis. 3. The left-sided ureteral stent is noted extending into the urinary bladder. 4. A simple appearing cyst is noted arising from the lower pole. No concerning renal lesion was identified on this study. 5. Status post right-sided nephrectomy. Electronically Signed   By: Constance Holster M.D.   On: 05/31/2020 17:33   DG Chest Portable 1 View  Result Date: 05/29/2020 CLINICAL DATA:  New oxygen requirement. EXAM: PORTABLE CHEST 1 VIEW COMPARISON:  Chest radiographs 02/13/2020 and CTA 02/16/2020 FINDINGS: Telemetry leads overlie the chest. The cardiomediastinal silhouette is unchanged with normal heart size. Aortic atherosclerosis is noted. Lung volumes are lower than on the prior study. Chronic interstitial coarsening is similar to the prior radiographs with emphysema shown on CT. There may be a trace left pleural effusion. No pneumothorax is identified. No acute osseous abnormality is seen. IMPRESSION: Chronic lung changes without evidence of pneumonia or edema. Possible trace left pleural effusion. Electronically Signed   By: Logan Bores M.D.   On: 05/29/2020 15:18   DG C-Arm 1-60 Min-No Report  Result Date: 05/30/2020 Fluoroscopy was utilized by the requesting physician.  No radiographic interpretation.   ECHOCARDIOGRAM COMPLETE  Result Date: 05/30/2020    ECHOCARDIOGRAM REPORT   Patient Name:   TRAVAS ORNDOFF [facility] Date of Exam: 05/30/2020 Medical Rec #:  [id number]      Height:       73.0 in  Accession #:    [id number]     Weight:       180.0 lb Date of Birth:  1940/09/12      BSA:          2.057 m Patient Age:    64 years       BP:           128/77 mmHg Patient Gender: M              HR:           73 bpm. Exam Location:  [facility] Procedure: 2D Echo, Cardiac Doppler and Color Doppler Indications:    Pulmonary Embolus 415.19 / I26.99  History:        Patient has no prior history of Echocardiogram examinations.                 COPD; Risk Factors:Dyslipidemia. ALS (amyotrophic lateral                 sclerosis).  Sonographer:    Alvino Chapel RCS Referring Phys: [id number] [place]  1. Left ventricular ejection fraction, by estimation, is 60 to 65%. The left ventricle has normal function. The left ventricle has no regional wall motion abnormalities. Left ventricular diastolic parameters are indeterminate.  2. Right ventricular systolic function is normal. The right ventricular size is mildly enlarged. Tricuspid regurgitation signal is inadequate for assessing PA pressure.  3. A small pericardial effusion is present. The pericardial effusion is anterior to the right ventricle.  4. The mitral valve is grossly normal. Trivial mitral valve regurgitation. Moderate mitral annular calcification.  5. The aortic valve is tricuspid. Aortic valve regurgitation is trivial. Mild to moderate aortic valve sclerosis/calcification is present, without any evidence of aortic stenosis.  6. The inferior vena cava is normal in size with <50% respiratory variability, suggesting right atrial pressure of 8 mmHg. FINDINGS  Left Ventricle: Left ventricular ejection fraction, by estimation, is 60 to 65%. The left ventricle has normal function. The left ventricle has no regional wall motion abnormalities. The left ventricular internal cavity size was normal in size. There is  borderline left ventricular hypertrophy. Left ventricular diastolic parameters are indeterminate. Right Ventricle: The right ventricular size is mildly enlarged. No increase in right ventricular wall thickness. Right  ventricular systolic function is normal. Tricuspid regurgitation signal is inadequate for assessing PA pressure. Left Atrium: Left atrial size was normal in size. Right Atrium: Right atrial size was normal in size. Pericardium: A small pericardial effusion is present. The pericardial effusion is anterior to the right ventricle. Mitral Valve: The mitral valve is grossly normal. Moderate mitral annular calcification. Trivial mitral valve regurgitation. Tricuspid Valve: The tricuspid valve is grossly normal. Tricuspid valve regurgitation is trivial. Aortic Valve: The aortic valve is tricuspid. There is mild aortic valve annular calcification. Aortic valve regurgitation is trivial. Mild to moderate aortic valve sclerosis/calcification is present, without any evidence of aortic stenosis. Pulmonic Valve: The pulmonic valve was not well visualized. Pulmonic valve regurgitation is trivial. Aorta: The aortic root is normal in size and structure. Venous: The inferior vena cava is normal in size with less than 50% respiratory variability, suggesting right atrial pressure of 8 mmHg. IAS/Shunts: No atrial level shunt detected by color flow Doppler.  LEFT VENTRICLE PLAX 2D LVIDd:         4.80 cm  Diastology LVIDs:         2.70 cm  LV e' medial:    6.09 cm/s  LV PW:         0.95 cm  LV E/e' medial:  8.2 LV IVS:        0.95 cm  LV e' lateral:   6.53 cm/s LVOT diam:     1.80 cm  LV E/e' lateral: 7.7 LV SV:         59 LV SV Index:   29 LVOT Area:     2.54 cm  RIGHT VENTRICLE RV S prime:     13.50 cm/s TAPSE (M-mode): 2.1 cm LEFT ATRIUM             Index       RIGHT ATRIUM           Index LA diam:        3.70 cm 1.80 cm/m  RA Area:     16.20 cm LA Vol (A2C):   71.9 ml 34.95 ml/m RA Volume:   37.80 ml  18.37 ml/m LA Vol (A4C):   56.1 ml 27.27 ml/m LA Biplane Vol: 67.0 ml 32.57 ml/m  AORTIC VALVE LVOT Vmax:   108.00 cm/s LVOT Vmean:  74.300 cm/s LVOT VTI:    0.233 m  AORTA Ao Root diam: 3.50 cm MITRAL VALVE MV Area (PHT): 1.76 cm     SHUNTS MV Decel Time: 430 msec    Systemic VTI:  0.23 m MV E velocity: 50.10 cm/s  Systemic Diam: 1.80 cm MV A velocity: 99.40 cm/s MV E/A ratio:  0.50 Rozann Lesches MD Electronically signed by Rozann Lesches MD Signature Date/Time: 05/30/2020/1:12:49 PM    Final     Lab Results:  CBC    Component Value Date/Time   WBC 10.9 (H) 06/01/2020 0426   RBC 4.41 06/01/2020 0426   HGB 11.9 (L) 06/01/2020 0426   HGB 13.2 10/09/2019 0859   HCT 40.5 06/01/2020 0426   HCT 42.5 10/09/2019 0859   PLT 189 06/01/2020 0426   PLT 180 05/15/2019 0938   MCV 91.8 06/01/2020 0426   MCV 85 10/09/2019 0859   MCH 27.0 06/01/2020 0426   MCHC 29.4 (L) 06/01/2020 0426   RDW 17.4 (H) 06/01/2020 0426   RDW 20.2 (H) 10/09/2019 0859   LYMPHSABS 0.8 05/29/2020 1640   LYMPHSABS 1.7 10/09/2019 0859   MONOABS 0.4 05/29/2020 1640   EOSABS 0.0 05/29/2020 1640   EOSABS 0.3 10/09/2019 0859   BASOSABS 0.0 05/29/2020 1640   BASOSABS 0.1 10/09/2019 0859    BMET    Component Value Date/Time   NA 142 06/01/2020 0426   NA 139 02/06/2020 0000   K 4.0 06/01/2020 0426   CL 107 06/01/2020 0426   CO2 25 06/01/2020 0426   GLUCOSE 114 (H) 06/01/2020 0426   BUN 30 (H) 06/01/2020 0426   BUN 21 02/06/2020 0000   CREATININE 0.99 06/01/2020 0426   CALCIUM 9.4 06/01/2020 0426   GFRNONAA >60 06/01/2020 0426   GFRAA 60 02/06/2020 0000    BNP    Component Value Date/Time   BNP 45.0 05/29/2020 1640    ProBNP No results found for: PROBNP  Specialty Problems      Pulmonary Problems   COPD with chronic bronchitis (HCC)    Gold stage C. COPD with frequent exacerbations  Positive response to Daliresp      Hemoptysis   COPD, group B, by GOLD 2017 classification ([place])   Shortness of breath   Chronic respiratory failure with hypoxia (HCC)      Allergies  Allergen Reactions  . Lipitor [Atorvastatin] Rash  and Other (See Comments)    Passed out    Immunization History  Administered Date(s) Administered  .  Fluad Quad(high Dose 65+) 02/14/2019, 03/12/2020  . Influenza Split 02/24/2012, 02/22/2013, 01/21/2015  . Influenza Whole 03/14/2009, 04/08/2010, 02/06/2011  . Influenza, High Dose Seasonal PF 02/22/2017, 02/14/2018  . Influenza,inj,Quad PF,6+ Mos 03/08/2014  . Moderna Sars-Covid-2 Vaccination 06/30/2019, 07/31/2019  . Pneumococcal Conjugate-13 01/08/2014  . Pneumococcal Polysaccharide-23 06/09/2007  . Tdap 08/18/2014    Past Medical History:  Diagnosis Date  . ALS (amyotrophic lateral sclerosis) ([place]) 2021  . Anemia    low iron  . Arthritis   . Atypical chest pain 01/04/2020  . Colonic polyp   . Constipation   . COPD (chronic obstructive pulmonary disease) ([place])   . Coronary artery calcification seen on CAT scan 01/04/2020  . GERD (gastroesophageal reflux disease)   . Gout   . History of kidney stones   . Malignant tumor of kidney ([place])   . Muscle atrophy   . Pain    LOWER BACK AND LEFT HIP - HX OF PREVIOUS LUMBAR AND CERVICAL SURGERY--PT STATES HE HAS HAD NUMBNESS IN BOTH FEET SINCE HIS BACK SURGERY  . Pure hypercholesterolemia 01/04/2020  . Renal calculus   . Retinal detachment   . Shortness of breath 01/04/2020    Tobacco History: Social History   Tobacco Use  Smoking Status Former Smoker  . Packs/day: 2.00  . Years: 40.00  . Pack years: 80.00  . Types: Cigarettes, Pipe  . Quit date: 06/09/1995  . Years since quitting: 25.0  Smokeless Tobacco Never Used   Counseling given: Not Answered   Continue to not smoke  Outpatient Encounter Medications as of 06/12/2020  Medication Sig  . allopurinol (ZYLOPRIM) 300 MG tablet Take 300 mg by mouth daily.  [name] apixaban (ELIQUIS) 5 MG TABS tablet Take 10mg  bid for 7 days then 5mg  bid  . b complex vitamins tablet Take 1 tablet by mouth daily.  . B Complex-C (B-COMPLEX WITH VITAMIN C) tablet Take 1 tablet by mouth daily.  . cetirizine (ZYRTEC) 10 MG tablet Take 10 mg by mouth daily.  . cholecalciferol (VITAMIN D3) 25 MCG (1000  UT) tablet Take 1,000 Units by mouth daily.  . clobetasol cream (TEMOVATE) [id number] % Apply 1 application topically 2 (two) times daily.  . Fluticasone-Umeclidin-Vilant (TRELEGY ELLIPTA) 100-62.5-25 MCG/INH AEPB Inhale 1 puff into the lungs daily.  . hydrOXYzine (ATARAX/VISTARIL) 10 MG tablet Take 10 mg by mouth at bedtime.  [name] ketorolac (ACULAR) 0.5 % ophthalmic solution Place 1 drop into the left eye 4 (four) times daily.  . metoprolol tartrate (LOPRESSOR) 50 MG tablet TAKE 1 TABLET 2 HOURS PRIOR TO CT  Please verify with your primary care doctor regarding this medication  . Multiple Vitamin (ONE-A-DAY MENS PO) Take 1 tablet by mouth daily.  . pantoprazole (PROTONIX) 40 MG tablet Take 1 tablet (40 mg total) by mouth daily.  . prednisoLONE acetate (PRED FORTE) 1 % ophthalmic suspension Place 1 drop into the left eye 4 (four) times daily.  . riluzole (RILUTEK) 50 MG tablet Take 1 tablet (50 mg total) by mouth every 12 (twelve) hours.  . sodium chloride (MURO 128) 5 % ophthalmic solution Place 1 drop into the left eye in the morning, at noon, in the evening, and at bedtime.  Viviana Simpler ELLIPTA 200-62.5-25 MCG/INH AEPB INHALE 1 PUFF ONCE DAILY  . [DISCONTINUED] prednisoLONE acetate (PRED FORTE) 1 % ophthalmic suspension Place 1 drop into the left eye 4 (  four) times daily. (Patient not taking: Reported on 06/12/2020)   No facility-administered encounter medications on file as of 06/12/2020.     Review of Systems  Review of Systems  Constitutional: Negative for activity change, chills, fatigue, fever and unexpected weight change.  HENT: Negative for postnasal drip, rhinorrhea, sinus pressure, sinus pain and sore throat.   Eyes: Negative.   Respiratory: Positive for shortness of breath. Negative for cough and wheezing.   Cardiovascular: Positive for leg swelling. Negative for chest pain and palpitations.  Gastrointestinal: Negative for constipation, diarrhea, nausea and vomiting.  Endocrine: Negative.    Genitourinary: Positive for hematuria.  Musculoskeletal: Negative.   Skin: Negative.   Neurological: Negative for dizziness and headaches.  Psychiatric/Behavioral: Negative.  Negative for dysphoric mood. The patient is not nervous/anxious.   All other systems reviewed and are negative.    Physical Exam  BP 104/62   Pulse 68   Temp (!) 97 F (36.1 C) (Tympanic)   Ht 6\' 1"  (1.854 m)   Wt 172 lb 6 oz (78.2 kg)   SpO2 97% Comment: 2 liters  BMI 22.74 kg/m   Wt Readings from Last 5 Encounters:  06/12/20 172 lb 6 oz (78.2 kg)  05/30/20 179 lb 14.3 oz (81.6 kg)  03/12/20 181 lb 9.6 oz (82.4 kg)  02/13/20 182 lb (82.6 kg)  01/04/20 182 lb (82.6 kg)    BMI Readings from Last 5 Encounters:  06/12/20 22.74 kg/m  05/30/20 23.73 kg/m  03/12/20 23.96 kg/m  02/13/20 26.11 kg/m  01/04/20 24.01 kg/m     Physical Exam Vitals and nursing note reviewed.  Constitutional:      General: He is not in acute distress.    Appearance: Normal appearance. He is normal weight.  HENT:     Head: Normocephalic and atraumatic.     Right Ear: Hearing and external ear normal.     Left Ear: Hearing and external ear normal.     Nose: Nose normal. No mucosal edema or rhinorrhea.     Right Turbinates: Not enlarged.     Left Turbinates: Not enlarged.     Mouth/Throat:     Mouth: Mucous membranes are dry.     Pharynx: Oropharynx is clear. No oropharyngeal exudate.  Eyes:     Pupils: Pupils are equal, round, and reactive to light.  Cardiovascular:     Rate and Rhythm: Normal rate and regular rhythm.     Pulses: Normal pulses.     Heart sounds: Normal heart sounds. No murmur heard.   Pulmonary:     Effort: Pulmonary effort is normal.     Breath sounds: Normal breath sounds. No decreased breath sounds, wheezing or rales.  Musculoskeletal:     Cervical back: Normal range of motion.     Right lower leg: No edema.     Left lower leg: No edema.  Lymphadenopathy:     Cervical: No cervical  adenopathy.  Skin:    General: Skin is warm and dry.     Capillary Refill: Capillary refill takes less than 2 seconds.     Findings: No erythema or rash.  Neurological:     General: No focal deficit present.     Mental Status: He is alert and oriented to person, place, and time.     Motor: No weakness.     Coordination: Coordination normal.     Gait: Gait is intact. Gait normal.  Psychiatric:        Mood and Affect: Mood normal.  Behavior: Behavior normal. Behavior is cooperative.        Thought Content: Thought content normal.        Judgment: Judgment normal.       Assessment & Plan:   Pulmonary emboli (HCC) Incidental finding with a ER admission two subsegmental PEs No family history of VTE events Not considered provoked at this point in time  Discussion: Discussed case with Dr. Vaughan Browner.  Could consider surgical intervention for needed kidney stone removal after 4 weeks of Eliquis treatment.  Would need bridging with Lovenox.  Patient will need 6 months of DOAC treatment for first VTE event  Plan: Continue Eliquis Complete follow-up [facility] Dr. Milford Cage next week Notify our office if and when surgery is scheduled, patient would require Lovenox bridging prior to surgical intervention Patient needs 4 weeks of Eliquis treatment before considering surgical intervention Plan on 6 months of DOAC treatment Referral to hematology   Chronic respiratory failure with hypoxia ([place]) Plan: Continue oxygen therapy Walk today in office patient required 2 L pulsed via POC POC order placed today Patient able to complete 1 lap on room air before exertional hypoxemia sudden  COPD with chronic bronchitis ([place]) Plan: Continue Trelegy Ellipta Samples provided today Trelegy Ellipta refilled Walk today in office  ALS (amyotrophic lateral sclerosis) ([place]) Followed by [facility] On experimental trial drug - Theracurmin  Recent pulmonary function testing performed at [facility]  did not show significant restrictive lung disease  Plan: Continue follow-up with [facility]  Hydroureteronephrosis Plan: Complete follow-up [facility] Dr. Milford Cage next week  Recurrent nephrolithiasis Patient reporting he needs kidney stone removal Has planned appointment with [facility] Dr. Milford Cage Currently having hematuria  Plan: Complete upcoming appointment with [facility] Dr. Milford Cage Have those records faxed to our office    Return in about 2 months (around 08/10/2020), or if symptoms worsen or fail to improve, for Follow up with Dr. Vaughan Browner.   Lauraine Rinne, NP 06/12/2020   This appointment required 50 minutes of patient care (this includes precharting, chart review, review of results, face-to-face care, etc.).

## 2020-06-12 NOTE — Patient Outreach (Addendum)
[facility]) Care Management  06/12/2020  Jerome Mays 05-05-1941 [id number]   RN Telephonic Care Coordinator F/U Appointment Outreach  Referral Date:  06/03/2020 Referral Source:  EMMI Red Alert  Reason for Referral:  EMMI Red Alert General Discharge Insurance:  [employer] Medicare   Outreach Attempt:  Attempted outreach to patient's wife for verification of arranging follow up appointment with primary care provider.  Granddaughter answered and stated, wife not available but she does not think primary care follow up has been arranged.  RN Care Coordinator outreached to primary care nurse and left message.  Outreached to primary care clinical coordinator Jerome Mays and left message.  Outreached to primary care office and spoke with secretary who informs me that primary care provider nurse, Jerome Mays has spoken with wife on 06/10/20 in the afternoon and wife declined primary care follow up at that time (stated he had follow up scheduled with pulmonology and urology and does not need medication refills at this time).  RN Care Coordinator outreached to wife again.  Verified HIPAA with wife.  Wife reports she did speak with Nurse Jerome Mays from Dr. Lanell Matar office but does not remember declining follow up visit, states she "was never offered visit".  Wife reports she still plans to take patient to primary care this month to have labs drawn prior to [facility] appointment and can do so without appointment.  Appointments:  Attended appointment with pulmonology today and has scheduled appointment with urology on 06/17/2020.   Addendum:  Received call back from Jerome Mays at Dr. Lanell Matar office.  Discussed wife declining appointment earlier in the week.  Discussed with DJ, RN Care Coordinator to follow up with wife in the next 2 weeks and will contact primary care if follow up is needed since he has seen Pulmonology today and has Urology appointment scheduled for Monday.  Did inform Jerome Mays patient will be office this  month for lab work prior to [facility] scheduled appointment.   Plan:  RN Care Coordinator will make next telephone outreach to patient/wife within the next 2 weeks as previously scheduled and assist in arranging PCP follow up if needed.   Jerome Lerner RN [facility] Care Management  RN Telephonic Care Coordinator [phone] [email or web address]

## 2020-06-12 NOTE — Telephone Encounter (Signed)
Called and spoke with pts wife and she stated that [facility] will not file the POC under their insurance as they will not take their insurance and it will cost the pt $2,200.  She is going to check with [facility] and see if they can tell her how much the POC is going to cost them.  She will call back if they decide to go that route.

## 2020-06-12 NOTE — Assessment & Plan Note (Signed)
Plan: Complete follow-up with [facility] Dr. Benancio Deeds next week

## 2020-06-12 NOTE — Assessment & Plan Note (Signed)
Plan: Continue oxygen therapy Walk today in office patient required 2 L pulsed via POC POC order placed today Patient able to complete 1 lap on room air before exertional hypoxemia sudden

## 2020-06-12 NOTE — Assessment & Plan Note (Signed)
Plan: Continue Trelegy Ellipta Samples provided today Trelegy Ellipta refilled Walk today in office

## 2020-06-14 ENCOUNTER — Other Ambulatory Visit: Payer: Self-pay | Admitting: Urology

## 2020-06-14 ENCOUNTER — Other Ambulatory Visit: Payer: Self-pay | Admitting: *Deleted

## 2020-06-14 DIAGNOSIS — N201 Calculus of ureter: Secondary | ICD-10-CM | POA: Diagnosis not present

## 2020-06-14 DIAGNOSIS — R310 Gross hematuria: Secondary | ICD-10-CM | POA: Diagnosis not present

## 2020-06-14 NOTE — Progress Notes (Signed)
Revewing pt chart for 06-18-2020 surgery with dr Para March.  Noted pt is on supplemental home oxygen which does not meet ambulatory surgery center guidelines .Left message for selita pt uses home oxygen and is not candidate for ambulatory surgery center due to guidelines and pt needs instructions about stopping eliquis for 06-18-2020 surgery.

## 2020-06-14 NOTE — Patient Outreach (Signed)
[facility]) Care Management  06/14/2020  Jerome Mays [birth year] [id number]   RN Telephonic Care Coordinator Care Coordination Outreach  Referral Date:06/03/2020 Referral Source:EMMI Red Alert Reason for Referral:EMMI Red Alert General Discharge Insurance:A[employer]na Medicare   Outreach Attempt:  Received telephone call from patient's wife.  Verified HIPAA with wife.  Wife stating patient having trouble voiding.  Spoke with patient and he states the last 2 time he has voided it has been very small amounts "almost dribble".  Also reports urine is still bloody with continued passing of blood clots (same as when in hospital).  Does acknowledge his lower abdomen is firm and states he has been drinking his normal amount of fluids (about 4 12 ounce cups a day).  RN Care Coordinator outreached to Dr. Elmyra Ricks office with Urology and was able to schedule patient and appointment for today at 3:15 pm for patient to arrive by 3 pm.  Wife and patient aware and state they are able to make it there in time.    Plan:  RN Care Coordinator will make next telephone outreach to patient/wife as previously scheduled within the next 2 weeks and patient wife agreeable to future outreach.   [place] Management  RN Telephonic Care Coordinator [phone] [name]

## 2020-06-17 ENCOUNTER — Encounter (HOSPITAL_COMMUNITY): Payer: Self-pay | Admitting: Urology

## 2020-06-17 ENCOUNTER — Other Ambulatory Visit (HOSPITAL_COMMUNITY)
Admission: RE | Admit: 2020-06-17 | Discharge: 2020-06-17 | Disposition: A | Discharge status: Home / Self Care | Payer: Medicare HMO | Source: Ambulatory Visit | Attending: Urology | Admitting: Urology

## 2020-06-17 ENCOUNTER — Other Ambulatory Visit: Payer: Self-pay

## 2020-06-17 DIAGNOSIS — Z20822 Contact with and (suspected) exposure to covid-19: Secondary | ICD-10-CM | POA: Diagnosis not present

## 2020-06-17 DIAGNOSIS — Z01812 Encounter for preprocedural laboratory examination: Secondary | ICD-10-CM | POA: Diagnosis not present

## 2020-06-17 LAB — SARS CORONAVIRUS 2 (TAT 6-24 HRS): SARS Coronavirus 2: NEGATIVE

## 2020-06-17 NOTE — Progress Notes (Addendum)
COVID Vaccine Completed: x2 Date COVID Vaccine completed:  06-30-19 & 07-31-19 COVID vaccine manufacturer:  Moderna     PCP - [name], MD Cardiologist - Skeet Latch, MD  Chest x-ray - 05-29-20 in Epic EKG - 05-30-20 in Epic Stress Test -  ECHO - 05-30-20 in Epic Cardiac Cath -  Pacemaker/ICD device last checked: Cardiac/Coronary CT - 02-13-20 in Epic  Sleep Study -  N/A CPAP -   Fasting Blood Sugar - N/A Checks Blood Sugar _____ times a day  Blood Thinner Instructions: Eliquis 5 mg.  Pt to stay on per Dr. Milford Cage Aspirin Instructions: Last Dose:  Anesthesia review:  CAD, COPD, ALS, chronic respiratory failure, hx of PE.  Pt is on 2L of O2, uses 80% of the time.  Patient denies shortness of breath, fever, cough and chest pain at PAT appointment.  Pt cannot use arms and hands due to ALS.  Pt is still able to walk without assistance and can climb stairs slowly.  Needs assistance with feeding, bathing and dressing.     Patient verbalized understanding of instructions that were given to them at the PAT appointment. Patient was also instructed that they will need to review over the PAT instructions again at home before surgery.

## 2020-06-17 NOTE — Progress Notes (Signed)
Anesthesia Chart Review   Case: [id number] Date/Time: 06/18/20 1215   Procedure: CYSTOSCOPY/URETEROSCOPY/HOLMIUM LASER/STENT EXCHANGE (Left ) - 1 HR   Anesthesia type: General   Pre-op diagnosis: LEFT URETERAL CALCULUS   [facility] ROOM 04 / [facility]   Surgeons: Remi Haggard, MD      DISCUSSION:79 y.o. former smoker with h/o GERD, COPD, ALS (can walk, cannot move arms, no difficulty breathing), PE, s/p right nephrectomy, left ureteral calculus scheduled for above procedure 06/19/2019 with Dr. Harold Barban.   Recent admission 12/22-12/25 with left proximal uretal stone and PE, started on Eliquis at this time. Cystoscopy 05/30/20 during this admission with no anesthesia complications.    He has since followed up with pulmonology, last seen 06/12/2020. Pt using 2L O2 at this visit, he was able to complete 1 lap on room air before experiencing exertional hypoxemia.  Per OV note, "Patient needs 4 weeks of Eliquis treatment before considering surgical intervention."  Urologist, Dr. Milford Cage, has advised pt to remain on Eliquis for upcoming procedure.  Discussed with pulmonology, pt stable to proceed.   Anticipate pt can proceed with planned procedure barring acute status change.    VS: Ht 6\' 1"  (1.854 m)   Wt 81.6 kg   BMI 23.75 kg/m   PROVIDERS: [name], MD is PCP   Marshell Garfinkel, MD is Pulmonologist  LABS: SDW, DOS (all labs ordered are listed, but only abnormal results are displayed)  Labs Reviewed - No data to display   IMAGES:   EKG: 05/30/2020 Rate 84 bpm  Sinus rhythm  Ventricular premature complex Prolonged PR interval  Incomplete RBBB Low voltage, precordial leads Borderline ST elevation, lateral leads   CV: CT Angio Chest 05/29/2020 IMPRESSION: 2 small subsegmental pulmonary emboli in right middle and lower lobes which are new since 02/16/2020 exam, and may be acute or subacute in age.  New focal ill-defined pulmonary opacity in anterior right  lung apex, likely infectious or inflammatory etiology with neoplasm considered less likely. Recommend continued follow-up by CT in 2-3 months.  Increased dependent atelectasis in both lower lobes.  Stable mild mediastinal and hilar lymphadenopathy, consistent with old granulomatous disease.  Echo 05/30/2020 IMPRESSIONS    1. Left ventricular ejection fraction, by estimation, is 60 to 65%. The  left ventricle has normal function. The left ventricle has no regional  wall motion abnormalities. Left ventricular diastolic parameters are  indeterminate.  2. Right ventricular systolic function is normal. The right ventricular  size is mildly enlarged. Tricuspid regurgitation signal is inadequate for  assessing PA pressure.  3. A small pericardial effusion is present. The pericardial effusion is  anterior to the right ventricle.  4. The mitral valve is grossly normal. Trivial mitral valve  regurgitation. Moderate mitral annular calcification.  5. The aortic valve is tricuspid. Aortic valve regurgitation is trivial.  Mild to moderate aortic valve sclerosis/calcification is present, without  any evidence of aortic stenosis.  6. The inferior vena cava is normal in size with <50% respiratory  variability, suggesting right atrial pressure of 8 mmHg.  Past Medical History:  Diagnosis Date  . ALS (amyotrophic lateral sclerosis) ([place]) 2021  . Anemia    low iron  . Arthritis   . Atypical chest pain 01/04/2020  . Colonic polyp   . Constipation   . COPD (chronic obstructive pulmonary disease) ([place])   . Coronary artery calcification seen on CAT scan 01/04/2020  . GERD (gastroesophageal reflux disease)   . Gout   . History of kidney  stones   . Malignant tumor of kidney ([place])   . Muscle atrophy   . Pain    LOWER BACK AND LEFT HIP - HX OF PREVIOUS LUMBAR AND CERVICAL SURGERY--PT STATES HE HAS HAD NUMBNESS IN BOTH FEET SINCE HIS BACK SURGERY  . Pure hypercholesterolemia 01/04/2020  .  Renal calculus   . Retinal detachment   . Shortness of breath 01/04/2020    Past Surgical History:  Procedure Laterality Date  . BACK SURGERY     LUMBAR SURGERY  . CATARACT EXTRACTION Bilateral 2003   Surgeon unknown  . CYSTOSCOPY W/ URETERAL STENT PLACEMENT Left 05/30/2020   Procedure: CYSTOSCOPY URETERAL STENT PLACEMENT;  Surgeon: Remi Haggard, MD;  [facility];  Service: Urology;  Laterality: Left;  . EYE SURGERY     BILATERAL CATARACT EXTRACTIONS  . GIVENS CAPSULE STUDY N/A 10/07/2015   Procedure: GIVENS CAPSULE STUDY;  Surgeon: Carol Ada, MD;  Location: [place];  Service: Endoscopy;  Laterality: N/A;  . HAND SURGERY     left  . KNEE SURGERY     right  . LAMINECTOMY     cervical  . LASER PHOTO ABLATION Left 08/19/2017   Procedure: LASER PHOTO ABLATION;  Surgeon: [name], MD;  Location: [place];  Service: Ophthalmology;  Laterality: Left;  . MEMBRANE PEEL Left 03/22/2017   Procedure: POSSIBLE MEMBRANE PEEL WITH SILICONE OIL AND PERFLURON;  Surgeon: [name], MD;  Location: [place];  Service: Ophthalmology;  Laterality: Left;  . NEPHRECTOMY     PT STATES RIGHT KIDNEY WAS REMOVED  . PAROTIDECTOMY Right 06/13/2018   Procedure: RIGHT TOTAL PAROTIDECTOMY;  Surgeon: Leta Baptist, MD;  Location: [place];  Service: ENT;  Laterality: Right;  . PARS PLANA VITRECTOMY Left 03/22/2017   Procedure: PARS PLANA VITRECTOMY WITH 25 GAUGE;  Surgeon: [name], MD;  Location: [place];  Service: Ophthalmology;  Laterality: Left;  . PARS PLANA VITRECTOMY W/ SCLERAL BUCKLE Left 08/19/2017   Procedure: 51 GAUGE PARS PLANA VITRECTOMY WITH SILCONE OIL REMOVAL;  Surgeon: [name], MD;  Location: [place];  Service: Ophthalmology;  Laterality: Left;  . 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 SALIVARY STONE REMOVAL Right 02/21/2019   Procedure: RIGHT PAROTID FISTULA REPAIR;  Surgeon: Leta Baptist, MD;  Location: [place];  Service: ENT;  Laterality: Right;  .  SHOULDER SURGERY  2020   [facility]  . TOTAL KNEE ARTHROPLASTY Left 10/19/2012   Procedure: LEFT TOTAL KNEE ARTHROPLASTY;  Surgeon: Magnus Sinning, MD;  [facility];  Service: Orthopedics;  Laterality: Left;  [name] VITRECTOMY 25 GAUGE WITH SCLERAL BUCKLE Left 02/17/2017   Procedure: VITRECTOMY 25 GAUGE WITH SCLERAL BUCKLE membrane peel, injection of silicone oil, and endolaser photocoagulation.;  Surgeon: [name], MD;  Location: [place];  Service: Ophthalmology;  Laterality: Left;    MEDICATIONS: No current facility-administered medications for this encounter.   [name] allopurinol (ZYLOPRIM) 300 MG tablet  . apixaban (ELIQUIS) 5 MG TABS tablet  . clobetasol (TEMOVATE) 0.05 % external solution  . ketoconazole (NIZORAL) 2 % shampoo  . ketorolac (ACULAR) 0.5 % ophthalmic solution  . mupirocin ointment (BACTROBAN) 2 %  . NON FORMULARY  . pramoxine (CERAVE ITCH RELIEF) 1 % LOTN  . prednisoLONE acetate (PRED FORTE) 1 % ophthalmic suspension  . riluzole (RILUTEK) 50 MG tablet  . sodium chloride (MURO 128) 5 % ophthalmic solution  . TRELEGY ELLIPTA 200-62.5-25 MCG/INH AEPB  . triamcinolone (KENALOG) 0.1 %  . b complex vitamins  tablet  . cetirizine (ZYRTEC) 10 MG tablet  . Cholecalciferol (VITAMIN D3) 50 MCG (2000 UT) TABS  . Fluticasone-Umeclidin-Vilant (TRELEGY ELLIPTA) 100-62.5-25 MCG/INH AEPB  . metoprolol tartrate (LOPRESSOR) 50 MG tablet  . Multiple Vitamin (MULTIVITAMIN WITH MINERALS) TABS tablet     Konrad Felix, PA-C [facility] [phone]

## 2020-06-17 NOTE — H&P (Signed)
Patient is a 80 year old white male with history of solitary left kidney. He was seen in the emergency room in [facility] on 05/29/2020 with flank pain and elevated serum creatinine. He was also noted to have evidence of pulmonary embolus during his evaluation in the emergency room at that time. He was subsequent transferred down to [facility] emergency room on 05/30/2020 and underwent urgent placement of left JJ stent. Creatinine level normalized and he was discharged home with outpatient follow-up scheduled here on 06/17/2020. The patient was also discharged home on Eliquis for his pulmonary emboli. In the interim he has continued to have some hematuria and occasional passage of clots. He had some difficulty voiding earlier today and call the office. Here now for evaluation for difficulty voiding.     ALLERGIES: Niaspan TBCR    MEDICATIONS: Allopurinol 300 mg tablet 1 tablet PO Daily  Hydroxyzine Hcl 10 mg tablet  Prednisolone Acetate 1 % suspension, drops  Riluzole 50 mg tablet  Sodium Chloride 5 % drops  Trelegy Ellipta 100 mcg-62.5 mcg-25 mcg/actuation blister, with inhalation device     GU PSH: Cystoscopy Insert Stent, Left - 05/30/2020 Exc H-f-nk-sp B9+marg 0.5 < - 2011 Exc H-f-nk-sp B9+marg 0.6-1 - 2011 Exc H-f-nk-sp B9+marg 1.1-2 - 2011       PSH Notes: Excision Of Lesion Genitalia Benign Up To .5cm, Excision Of Lesion Genitalia Benign .6 To 1cm, Excision Of Lesion Genitalia Benign 1.1 To 2cm, Knee Surgery, Back Surgery, Hand Surgery   NON-GU PSH: None   GU PMH: Acute Cystitis/UTI, Acute Cystitis - 2014 BPH w/LUTS, 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, Erectile dysfunction due to arterial insufficiency - 2014 Prostate nodule w/o LUTS, Nodular prostate without lower urinary tract symptoms - 2014      PMH Notes:  2006-05-28 11:27:24 - Note: Anxiety  1898-06-08 00:00:00 - Note: Normal Routine History  And Physical Senior Citizen [phone]  2006-05-28 11:27:24 - Note: Arthritis  2009-12-11 09:38:49 - Note: Kidney Cancer   NON-GU PMH: Generalized enlarged lymph nodes, Lymphadenopathy - 2014 Gout, Gout - 2014 Personal history of diseases of the skin and subcutaneous tissue, History of sebaceous cyst - 2014    FAMILY HISTORY: 2 daughters - Daughter 1 son - Son   SOCIAL HISTORY: Marital Status: Married Preferred Language: English; Ethnicity: Not Hispanic Or Latino; Race: White Current Smoking Status: Patient has never smoked.   Tobacco Use Assessment Completed: Used Tobacco in last 30 days? Does not use smokeless tobacco. Has never drank.  Does not use drugs. Drinks 3 caffeinated drinks per day. Has had a blood transfusion.     Notes: Tobacco Use, Death In The Family Mother, Death In The Family Father   REVIEW OF SYSTEMS:    GU Review Male:   Patient denies frequent urination, hard to postpone urination, burning/ pain with urination, get up at night to urinate, leakage of urine, stream starts and stops, trouble starting your stream, have to strain to urinate , erection problems, and penile pain.  Gastrointestinal (Upper):   Patient denies nausea, vomiting, and indigestion/ heartburn.  Gastrointestinal (Lower):   Patient denies diarrhea and constipation.  Constitutional:   Patient denies fever, night sweats, weight loss, and fatigue.  Skin:   Patient denies skin rash/ lesion and itching.  Eyes:   Patient denies blurred vision and double vision.  Ears/ Nose/ Throat:   Patient denies sore throat and sinus problems.  Hematologic/Lymphatic:   Patient denies swollen glands and easy  bruising.  Cardiovascular:   Patient denies leg swelling and chest pains.  Respiratory:   Patient denies shortness of breath and cough.  Endocrine:   Patient denies excessive thirst.  Musculoskeletal:   Patient denies back pain and joint pain.  Neurological:   Patient denies headaches and dizziness.   Psychologic:   Patient denies depression and anxiety.   VITAL SIGNS:      06/14/2020 03:30 PM  Weight 178 lb / 80.74 kg  Height 73 in / 185.42 cm  BP 132/83 mmHg  Pulse 91 /min  BMI 23.5 kg/m   GU PHYSICAL EXAMINATION:    Anus and Perineum: No hemorrhoids. No anal stenosis. No rectal fissure, no anal fissure. No edema, no dimple, no perineal tenderness, no anal tenderness.  Scrotum: No lesions. No edema. No cysts. No warts.  Epididymides: Right: no spermatocele, no masses, no cysts, no tenderness, no induration, no enlargement. Left: no spermatocele, no masses, no cysts, no tenderness, no induration, no enlargement.  Testes: No tenderness, no swelling, no enlargement left testes. No tenderness, no swelling, no enlargement right testes. Normal location left testes. Normal location right testes. No mass, no cyst, no varicocele, no hydrocele left testes. No mass, no cyst, no varicocele, no hydrocele right testes.  Urethral Meatus: Normal size. No lesion, no wart, no discharge, no polyp. Normal location.  Penis: Circumcised, no warts, no cracks. No dorsal Peyronie's plaques, no left corporal Peyronie's plaques, no right corporal Peyronie's plaques, no scarring, no warts. No balanitis, no meatal stenosis.  Prostate: 40 gram or 2+ size. Left lobe normal consistency, right lobe normal consistency. Symmetrical lobes. No prostate nodule. Left lobe no tenderness, right lobe no tenderness.  Seminal Vesicles: Nonpalpable.  Sphincter Tone: Normal sphincter. No rectal tenderness. No rectal mass.    MULTI-SYSTEM PHYSICAL EXAMINATION:    Constitutional: Well-nourished. No physical deformities. Normally developed. Good grooming.  Neck: Neck symmetrical, not swollen. Normal tracheal position.  Respiratory: No labored breathing, no use of accessory muscles.   Cardiovascular: Normal temperature, normal extremity pulses, no swelling, no varicosities.  Lymphatic: No enlargement of neck, axillae, groin.   Skin: No paleness, no jaundice, no cyanosis. No lesion, no ulcer, no rash.  Neurologic / Psychiatric: Oriented to time, oriented to place, oriented to person. No depression, no anxiety, no agitation.  Gastrointestinal: No mass, no tenderness, no rigidity, non obese abdomen.  Eyes: Normal conjunctivae. Normal eyelids.  Ears, Nose, Mouth, and Throat: Left ear no scars, no lesions, no masses. Right ear no scars, no lesions, no masses. Nose no scars, no lesions, no masses. Normal hearing. Normal lips.  Musculoskeletal: Normal gait and station of head and neck.     Complexity of Data:  Source Of History:  Patient  Records Review:   Previous Doctor Records, Previous Hospital Records  Urine Test Review:   Urinalysis  Urodynamics Review:   Review Bladder Scan   08/05/09 07/16/08 06/01/07 03/03/05 01/22/04  PSA  Total PSA 1.16  1.33  1.02  0.66  0.54     PROCEDURES:          Urinalysis w/Scope - 81001 Dipstick Dipstick Cont'd Micro  Color: Red Bilirubin: Invalid WBC/hpf: 6 - 10/hpf  Appearance: Turbid Ketones: Invalid RBC/hpf: Packed/hpf  Specific Gravity: Invalid Blood: Invalid Bacteria: Mod (26-50/hpf)  pH: Invalid Protein: Invalid Cystals: NS (Not Seen)  Glucose: Invalid Urobilinogen: Invalid Casts: NS (Not Seen)    Nitrites: Invalid Trichomonas: Not Present    Leukocyte Esterase: Invalid Mucous: Not Present  Epithelial Cells: NS (Not Seen)      Yeast: NS (Not Seen)      Sperm: Not Present    Notes:  too bloody to dip or spin    ASSESSMENT:      ICD-10 Details  1 GU:   Ureteral calculus - O96.2 Acute, Complicated Injury  2   Gross hematuria - X52.8 Acute, Complicated Injury   PLAN:           Orders Labs BMP, CBC with Diff          Document Letter(s):  Created for Patient: Clinical Summary         Notes:   As the patient has minimal residual on PVR ultrasound today were going to follow expectantly and not place Foley catheter. He is seems to be voiding  satisfactorily currently after passing a small clot earlier today. We will go ahead and schedule for cysto and left ureteroscopy with laser lithotripsy in the near future. Risks and benefits discussed as outlined below. Will continue on Eliquis with history of PE.  I have recommended retrograde pyelogram, ureteroscopic stone manipulation with laser lithotripsy. I have discussed in detail the risks, benefits and alternatives of ureteroscopic stone extraction to include but not limited to: Bleeding, infection, ureteral perforation with need for open repair, inability to place the stent necessitating the need for further procedures, possible percutaneous nephrostomy tube placement, discomfort from the stents, hematuria, urgency, frequency and refractory problems after the stent is removed. I discussed the stent is not a permanent stent and will require a followup for stent removal or stent exchange. The patient knows there is high risk for ureteral stent incrustation if this is not removed or exchanged within 3 months. Patient voices understanding of the risks and benefits of the procedure and consents to the procedure.

## 2020-06-18 ENCOUNTER — Ambulatory Visit (HOSPITAL_COMMUNITY)
Admission: RE | Admit: 2020-06-18 | Discharge: 2020-06-18 | Disposition: A | Discharge status: Home / Self Care | Payer: Medicare HMO | Attending: Urology | Admitting: Urology

## 2020-06-18 ENCOUNTER — Ambulatory Visit (HOSPITAL_COMMUNITY): Payer: Medicare HMO | Admitting: Certified Registered Nurse Anesthetist

## 2020-06-18 ENCOUNTER — Ambulatory Visit (HOSPITAL_COMMUNITY): Payer: Medicare HMO

## 2020-06-18 ENCOUNTER — Encounter (HOSPITAL_COMMUNITY)
Admission: RE | Disposition: A | Discharge status: Home / Self Care | Payer: Self-pay | Source: Home / Self Care | Attending: Urology

## 2020-06-18 ENCOUNTER — Encounter (HOSPITAL_COMMUNITY): Payer: Self-pay | Admitting: Urology

## 2020-06-18 DIAGNOSIS — Z7901 Long term (current) use of anticoagulants: Secondary | ICD-10-CM | POA: Diagnosis not present

## 2020-06-18 DIAGNOSIS — J449 Chronic obstructive pulmonary disease, unspecified: Secondary | ICD-10-CM | POA: Diagnosis not present

## 2020-06-18 DIAGNOSIS — G1221 Amyotrophic lateral sclerosis: Secondary | ICD-10-CM | POA: Insufficient documentation

## 2020-06-18 DIAGNOSIS — Z8719 Personal history of other diseases of the digestive system: Secondary | ICD-10-CM | POA: Insufficient documentation

## 2020-06-18 DIAGNOSIS — I2693 Single subsegmental pulmonary embolism without acute cor pulmonale: Secondary | ICD-10-CM | POA: Diagnosis not present

## 2020-06-18 DIAGNOSIS — Z905 Acquired absence of kidney: Secondary | ICD-10-CM | POA: Insufficient documentation

## 2020-06-18 DIAGNOSIS — J9611 Chronic respiratory failure with hypoxia: Secondary | ICD-10-CM | POA: Diagnosis not present

## 2020-06-18 DIAGNOSIS — Z86711 Personal history of pulmonary embolism: Secondary | ICD-10-CM | POA: Diagnosis not present

## 2020-06-18 DIAGNOSIS — I2510 Atherosclerotic heart disease of native coronary artery without angina pectoris: Secondary | ICD-10-CM | POA: Diagnosis not present

## 2020-06-18 DIAGNOSIS — Z79899 Other long term (current) drug therapy: Secondary | ICD-10-CM | POA: Diagnosis not present

## 2020-06-18 DIAGNOSIS — Z791 Long term (current) use of non-steroidal anti-inflammatories (NSAID): Secondary | ICD-10-CM | POA: Insufficient documentation

## 2020-06-18 DIAGNOSIS — N201 Calculus of ureter: Secondary | ICD-10-CM | POA: Diagnosis not present

## 2020-06-18 DIAGNOSIS — I313 Pericardial effusion (noninflammatory): Secondary | ICD-10-CM | POA: Insufficient documentation

## 2020-06-18 DIAGNOSIS — Z87442 Personal history of urinary calculi: Secondary | ICD-10-CM | POA: Diagnosis not present

## 2020-06-18 DIAGNOSIS — Z466 Encounter for fitting and adjustment of urinary device: Secondary | ICD-10-CM | POA: Diagnosis not present

## 2020-06-18 DIAGNOSIS — R310 Gross hematuria: Secondary | ICD-10-CM | POA: Diagnosis not present

## 2020-06-18 DIAGNOSIS — Z85528 Personal history of other malignant neoplasm of kidney: Secondary | ICD-10-CM | POA: Diagnosis not present

## 2020-06-18 DIAGNOSIS — E7800 Pure hypercholesterolemia, unspecified: Secondary | ICD-10-CM | POA: Diagnosis not present

## 2020-06-18 HISTORY — PX: CYSTOSCOPY/URETEROSCOPY/HOLMIUM LASER/STENT PLACEMENT: SHX6546

## 2020-06-18 LAB — BASIC METABOLIC PANEL
Anion gap: 6 (ref 5–15)
BUN: 16 mg/dL (ref 8–23)
CO2: 26 mmol/L (ref 22–32)
Calcium: 9.2 mg/dL (ref 8.9–10.3)
Chloride: 107 mmol/L (ref 98–111)
Creatinine, Ser: 0.91 mg/dL (ref 0.61–1.24)
GFR, Estimated: >60 mL/min (ref >60)
Glucose, Bld: 113 mg/dL — ABNORMAL HIGH (ref 70–99)
Potassium: 3.8 mmol/L (ref 3.5–5.1)
Sodium: 139 mmol/L (ref 135–145)

## 2020-06-18 LAB — CBC
HCT: 30.5 % — ABNORMAL LOW (ref 39.0–52.0)
Hemoglobin: 9 g/dL — ABNORMAL LOW (ref 13.0–17.0)
MCH: 27 pg (ref 26.0–34.0)
MCHC: 29.5 g/dL — ABNORMAL LOW (ref 30.0–36.0)
MCV: 91.6 fL (ref 80.0–100.0)
Platelets: 208 10*3/uL (ref 150–400)
RBC: 3.33 MIL/uL — ABNORMAL LOW (ref 4.22–5.81)
RDW: 17.9 % — ABNORMAL HIGH (ref 11.5–15.5)
WBC: 7.4 10*3/uL (ref 4.0–10.5)
nRBC: 0 % (ref 0.0–0.2)

## 2020-06-18 SURGERY — CYSTOSCOPY/URETEROSCOPY/HOLMIUM LASER/STENT PLACEMENT
Anesthesia: General | Site: Ureter | Laterality: Left

## 2020-06-18 MED ORDER — PHENYLEPHRINE 40 MCG/ML (10ML) SYRINGE FOR IV PUSH (FOR BLOOD PRESSURE SUPPORT)
PREFILLED_SYRINGE | INTRAVENOUS | Status: AC
  Filled 2020-06-18: qty 10

## 2020-06-18 MED ORDER — ONDANSETRON HCL 4 MG/2ML IJ SOLN
INTRAMUSCULAR | Status: DC | PRN
  Administered 2020-06-18: 4 mg via INTRAVENOUS

## 2020-06-18 MED ORDER — OXYCODONE-ACETAMINOPHEN 5-325 MG PO TABS
1.0000 | ORAL_TABLET | ORAL | Status: AC
  Administered 2020-06-18: 1 via ORAL

## 2020-06-18 MED ORDER — FENTANYL CITRATE (PF) 100 MCG/2ML IJ SOLN
INTRAMUSCULAR | Status: DC | PRN
  Administered 2020-06-18: 25 ug via INTRAVENOUS
  Administered 2020-06-18: 50 ug via INTRAVENOUS
  Administered 2020-06-18: 25 ug via INTRAVENOUS

## 2020-06-18 MED ORDER — ONDANSETRON HCL 4 MG/2ML IJ SOLN: 4.0000 mg | Freq: Once | INTRAMUSCULAR | Status: DC | PRN

## 2020-06-18 MED ORDER — CHLORHEXIDINE GLUCONATE 0.12 % MT SOLN: 15.0000 mL | Freq: Once | OROMUCOSAL | Status: DC

## 2020-06-18 MED ORDER — PROPOFOL 10 MG/ML IV BOLUS
INTRAVENOUS | Status: AC
  Filled 2020-06-18: qty 20

## 2020-06-18 MED ORDER — OXYCODONE-ACETAMINOPHEN 5-325 MG PO TABS: 1.0000 | ORAL_TABLET | Freq: Four times a day (QID) | ORAL | 0 refills | Status: DC | PRN

## 2020-06-18 MED ORDER — IOHEXOL 300 MG/ML  SOLN
INTRAMUSCULAR | Status: DC | PRN
  Administered 2020-06-18: 12 mL

## 2020-06-18 MED ORDER — PHENYLEPHRINE 40 MCG/ML (10ML) SYRINGE FOR IV PUSH (FOR BLOOD PRESSURE SUPPORT)
PREFILLED_SYRINGE | INTRAVENOUS | Status: DC | PRN
  Administered 2020-06-18: 120 ug via INTRAVENOUS
  Administered 2020-06-18: 80 ug via INTRAVENOUS

## 2020-06-18 MED ORDER — LIDOCAINE 2% (20 MG/ML) 5 ML SYRINGE
INTRAMUSCULAR | Status: DC | PRN
  Administered 2020-06-18: 100 mg via INTRAVENOUS

## 2020-06-18 MED ORDER — LIDOCAINE HCL (PF) 2 % IJ SOLN
INTRAMUSCULAR | Status: AC
  Filled 2020-06-18: qty 5

## 2020-06-18 MED ORDER — SODIUM CHLORIDE 0.9 % IR SOLN
Status: DC | PRN
  Administered 2020-06-18: 6000 mL

## 2020-06-18 MED ORDER — CEFAZOLIN SODIUM-DEXTROSE 2-4 GM/100ML-% IV SOLN
2.0000 g | INTRAVENOUS | Status: AC
  Administered 2020-06-18: 2 g via INTRAVENOUS
  Filled 2020-06-18: qty 100

## 2020-06-18 MED ORDER — ORAL CARE MOUTH RINSE: 15.0000 mL | Freq: Once | OROMUCOSAL | Status: DC

## 2020-06-18 MED ORDER — LACTATED RINGERS IV SOLN: INTRAVENOUS | Status: DC

## 2020-06-18 MED ORDER — PROPOFOL 10 MG/ML IV BOLUS
INTRAVENOUS | Status: DC | PRN
  Administered 2020-06-18: 100 mg via INTRAVENOUS

## 2020-06-18 MED ORDER — OXYCODONE-ACETAMINOPHEN 5-325 MG PO TABS
ORAL_TABLET | ORAL | Status: AC
  Filled 2020-06-18: qty 1

## 2020-06-18 MED ORDER — ONDANSETRON HCL 4 MG/2ML IJ SOLN
INTRAMUSCULAR | Status: AC
  Filled 2020-06-18: qty 2

## 2020-06-18 MED ORDER — FENTANYL CITRATE (PF) 100 MCG/2ML IJ SOLN: 25.0000 ug | INTRAMUSCULAR | Status: DC | PRN

## 2020-06-18 MED ORDER — 0.9 % SODIUM CHLORIDE (POUR BTL) OPTIME
TOPICAL | Status: DC | PRN
  Administered 2020-06-18: 1000 mL

## 2020-06-18 MED ORDER — FENTANYL CITRATE (PF) 100 MCG/2ML IJ SOLN
INTRAMUSCULAR | Status: AC
  Filled 2020-06-18: qty 2

## 2020-06-18 SURGICAL SUPPLY — 29 items
BAG URO CATCHER STRL LF (MISCELLANEOUS) ×2 IMPLANT
BASKET STONE 1.7 NGAGE (UROLOGICAL SUPPLIES) ×2 IMPLANT
BASKET ZERO TIP NITINOL 2.4FR (BASKET) IMPLANT
BSKT STON RTRVL ZERO TP 2.4FR (BASKET)
BULB IRRIG PATHFIND (MISCELLANEOUS) ×2 IMPLANT
CATH 18FR 3 WAY 30 (CATHETERS) ×2
CATH 18FR 3WAY 30CC (CATHETERS) ×1 IMPLANT
CATH URET 5FR 28IN OPEN ENDED (CATHETERS) IMPLANT
CLOTH BEACON ORANGE TIMEOUT ST (SAFETY) ×2 IMPLANT
GLOVE SURG ENC TEXT LTX SZ7.5 (GLOVE) ×2 IMPLANT
GOWN STRL REUS W/TWL XL LVL3 (GOWN DISPOSABLE) ×2 IMPLANT
GUIDEWIRE ANG ZIPWIRE 038X150 (WIRE) IMPLANT
GUIDEWIRE STR DUAL SENSOR (WIRE) ×2 IMPLANT
IV NS 1000ML (IV SOLUTION) ×2
IV NS 1000ML BAXH (IV SOLUTION) ×1 IMPLANT
KIT TURNOVER KIT A (KITS) IMPLANT
LASER FIB FLEXIVA PULSE ID 365 (Laser) IMPLANT
MANIFOLD NEPTUNE II (INSTRUMENTS) ×2 IMPLANT
PACK CYSTO (CUSTOM PROCEDURE TRAY) ×2 IMPLANT
SHEATH URETERAL 12FRX28CM (UROLOGICAL SUPPLIES) ×2 IMPLANT
SHEATH URETERAL 12FRX35CM (MISCELLANEOUS) IMPLANT
STENT URET 6FRX26 CONTOUR (STENTS) ×2 IMPLANT
SYR 20ML LL LF (SYRINGE) ×2 IMPLANT
SYR TOOMEY IRRIG 70ML (MISCELLANEOUS) ×2
SYRINGE TOOMEY IRRIG 70ML (MISCELLANEOUS) ×1 IMPLANT
TRACTIP FLEXIVA PULS ID 200XHI (Laser) ×1 IMPLANT
TRACTIP FLEXIVA PULSE ID 200 (Laser) ×2
TUBING CONNECTING 10 (TUBING) ×2 IMPLANT
TUBING UROLOGY SET (TUBING) ×2 IMPLANT

## 2020-06-18 NOTE — Op Note (Signed)
Operative report  Preoperative diagnosis: Left proximal ureteral calculus Postop diagnosis: Same Procedure: Cystoscopy, left retrograde pyelogram with intraoperative interpretation, left ureteroscopy with laser lithotripsy of left ureteral calculus, left JJ stent exchange Surgeon: Milford Cage Anesthesia: General Estimated blood loss less than 50 cc Operative findings 8 mm left proximal ureteral calculus, stone was fragmented and dusted into numerous small particles, a couple of fragments saved for analysis majority will irrigate out along the stent.  6 [place] by 26 cm left JJ stent exchanged.  64 French Foley placed at termination of the case due to prostatic urethral oozing. Operative note: After obtaining informed consent for the patient was taken the major cystoscopy suite and placed under general anesthesia.  He was placed in the dorsolithotomy position genitalia prepped and draped in usual sterile fashion.  Proper pause and timeout was performed.  The 43 French scope was advanced into the bladder without difficulty.  Left JJ stent was identified.  This was grasped with alligator graspers and pulled just beyond the urethral meatus.  There was significant encrustation of the stent which made removal somewhat difficult.  I was able to remove the stent with constant pressure and eventually a stent came out.  There was some encrustation again along the outer portion of the stent which made removal tight but no ureteral injury after removal of the stent.  Retrograde pyelogram was performed with 5 French open tip catheter and revealed good integrity of the ureter and no extravasation.  Filling defects noted in the left proximal ureter consistent with 8 mm stone seen at the time of original stenting.  The 6.4 French semirigid ureteroscope was then advanced under direct vision into the left ureteral orifice.  The ureter was somewhat tortuous distally and I was able to then pass a guidewire under direct vision up  the ureter to the left renal pelvis.  I advanced the ureteroscope along its length to the stone but due to the angle going over the iliac vessels could not visualize a stone adequately to treat with the semirigid scope.  Semirigid scope was removed.  The 1214 French ureteral access sheath was then advanced over the guidewire and easily advanced up the ureter under fluoroscopy.  The flexible ureteroscope was then advanced under direct vision into the ureter and advanced up to the level of the stone.  Using the 365 m holmium laser fiber the stone was fragmented and then dusted into small's particles some of which irrigated out through the sheath.  I did extract a couple of small fragments but they were difficult to extract due to the small size of the fragments.  Scope was advanced all the way to the renal pelvis and there was some clot in the renal pelvis but no obvious stone fragments were noted.  Retrograde pyelogram was performed through the ureteroscope again showing good integrity of the collecting system and the renal pelvis and along the ureter with no extravasation.  Guidewire was passed through the flexible scope and coiled in the renal pelvis.  The scope was then backed out along with the access sheath visualizing the ureter along its length and there was no significant fragments in the ureter.  All of the fragments of stone were sand and dust particles that should easily irrigate out.  The wire was then back fed through the cystoscope and a 6 [place] by 26 cm soft Contour stent was placed leaving a proximal coil in the renal pelvis and the distal coil in the bladder.  There was  good flow of urine through and around the stent noted.  There was some oozing from the prostatic urethral area as there was an elevated bladder neck and there was some oozing from the vessels at the bladder neck from passage of the cystoscope.  I subsequently placed 18 French Foley and irrigated this and it seemed to help  significantly.  Urine was still pink but no clots noted.  The Foley was placed to gravity drain.  The procedure was terminated.  He was awakened from anesthesia and taken back to the recovery room in stable condition.  No immediate complication from the procedure.

## 2020-06-18 NOTE — Anesthesia Preprocedure Evaluation (Signed)
Anesthesia Evaluation  Patient identified by MRN, date of birth, ID band Patient awake    Reviewed: Allergy & Precautions, H&P , NPO status , Patient's Chart, lab work & pertinent test results  Airway Mallampati: II  TM Distance: >3 FB Neck ROM: Full    Dental no notable dental hx.    Pulmonary COPD, former smoker,    Pulmonary exam normal breath sounds clear to auscultation       Cardiovascular Normal cardiovascular exam Rhythm:Regular Rate:Normal     Neuro/Psych ALS  Neuromuscular disease negative psych ROS   GI/Hepatic Neg liver ROS, GERD  Medicated,  Endo/Other  negative endocrine ROS  Renal/GU negative Renal ROS  negative genitourinary   Musculoskeletal negative musculoskeletal ROS (+)   Abdominal   Peds negative pediatric ROS (+)  Hematology negative hematology ROS (+)   Anesthesia Other Findings   Reproductive/Obstetrics negative OB ROS                             Anesthesia Physical Anesthesia Plan  ASA: IV  Anesthesia Plan: General   Post-op Pain Management:    Induction: Intravenous  PONV Risk Score and Plan: 2 and Ondansetron, Dexamethasone and Treatment may vary due to age or medical condition  Airway Management Planned: LMA  Additional Equipment:   Intra-op Plan:   Post-operative Plan: Extubation in OR  Informed Consent: I have reviewed the patients History and Physical, chart, labs and discussed the procedure including the risks, benefits and alternatives for the proposed anesthesia with the patient or authorized representative who has indicated his/her understanding and acceptance.     Dental advisory given  Plan Discussed with: CRNA and Surgeon  Anesthesia Plan Comments:         Anesthesia Quick Evaluation

## 2020-06-18 NOTE — Interval H&P Note (Signed)
History and Physical Interval Note:  06/18/2020 10:33 AM  Jerome Mays  has presented today for surgery, with the diagnosis of LEFT URETERAL CALCULUS.  The various methods of treatment have been discussed with the patient and family. After consideration of risks, benefits and other options for treatment, the patient has consented to  Procedure(s) with comments: CYSTOSCOPY/URETEROSCOPY/HOLMIUM LASER/STENT EXCHANGE (Left) - 1 HR as a surgical intervention.  The patient's history has been reviewed, patient examined, no change in status, stable for surgery.  I have reviewed the patient's chart and labs.  Questions were answered to the patient's satisfaction.     Remi Haggard

## 2020-06-18 NOTE — Discharge Instructions (Addendum)
Lithotripsy, Care After This sheet gives you information about how to care for yourself after your procedure. Your health care provider may also give you more specific instructions. If you have problems or questions, contact your health care provider. What can I expect after the procedure? After the procedure, it is common to have:  Some blood in your urine. This should only last for a few days.  Soreness in your back, sides, or upper abdomen for a few days.  Blotches or bruises on the area where the shock wave entered the skin.  Pain, discomfort, or nausea when pieces (fragments) of the kidney stone move through the tube that carries urine from the kidney to the bladder (ureter). Stone fragments may pass soon after the procedure, but they may continue to pass for up to 4-8 weeks. ? If you have severe pain or nausea, contact your health care provider. This may be caused by a large stone that was not broken up, and this may mean that you need more treatment.  Some pain or discomfort during urination.  Some pain or discomfort in the lower abdomen or (in men) at the base of the penis. Follow these instructions at home: Medicines  Take over-the-counter and prescription medicines only as told by your health care provider.  If you were prescribed an antibiotic medicine, take it as told by your health care provider. Do not stop taking the antibiotic even if you start to feel better.  Ask your health care provider if the medicine prescribed to you requires you to avoid driving or using machinery. Eating and drinking  Drink enough fluid to keep your urine pale yellow. This helps any remaining pieces of the stone to pass. It can also help prevent new stones from forming.  Eat plenty of fresh fruits and vegetables.  Follow instructions from your health care provider about eating or drinking restrictions. You may be instructed to: ? Reduce how much salt (sodium) you eat or drink. Check  ingredients and nutrition facts on packaged foods and beverages to see how much sodium they contain. ? Reduce how much meat you eat.  Eat the recommended amount of calcium for your age and gender. Ask your health care provider how much calcium you should have.      General instructions  Get plenty of rest.  Return to your normal activities as told by your health care provider. Ask your health care provider what activities are safe for you. Most people can resume normal activities 1-2 days after the procedure.  If you were given a sedative during the procedure, it can affect you for several hours. Do not drive or operate machinery until your health care provider says that it is safe.  Your health care provider may direct you to lie in a certain position (postural drainage) and tap firmly (percuss) over your kidney area to help stone fragments pass. Follow instructions as told by your health care provider.  If directed, strain all urine through the strainer that was provided by your health care provider. ? Keep all fragments for your health care provider to see. Any stones that are found may be sent to a medical lab for examination. The stone may be as small as a grain of salt.  Keep all follow-up visits as told by your health care provider. This is important. Contact a health care provider if:  You have a fever or chills.  You have nausea that is severe or does not go away.  You have   any of these urinary symptoms: ? Blood in your urine for longer than your health care provider told you to expect. ? Urine that smells bad or unusual. ? Feeling a strong urge to urinate after emptying your bladder. ? Pain or burning with urination that does not go away. ? Urinating more often than usual and this does not go away.  You have a stent and it comes out. Get help right away if:  You have severe pain in your back, sides, or upper abdomen.  You have any of these urinary symptoms: ? Severe  pain while urinating. ? More blood in your urine or having blood in your urine when you did not before. ? Passing blood clots in your urine. ? Passing only a small amount of urine or being unable to pass any urine at all.  You have severe nausea that leads to persistent vomiting.  You faint. Summary  After this procedure, it is common to have some pain, discomfort, or nausea when pieces (fragments) of the kidney stone move through the tube that carries urine from the kidney to the bladder (ureter). If this pain or nausea is severe, however, you should contact your health care provider.  Return to your normal activities as told by your health care provider. Ask your health care provider what activities are safe for you.  Drink enough fluid to keep your urine pale yellow. This helps any remaining pieces of the stone to pass, and it can help prevent new stones from forming.  If directed, strain your urine and keep all fragments for your health care provider to see. Fragments or stones may be as small as a grain of salt.  Get help right away if you have severe pain in your back, sides, or upper abdomen, or if you have severe pain while urinating. This information is not intended to replace advice given to you by your health care provider. Make sure you discuss any questions you have with your health care provider. Document Revised: 03/08/2019 Document Reviewed: 03/08/2019 Elsevier Patient Education  2021 [place], Adult An indwelling urinary catheter is a thin tube that is put into your bladder. The tube helps to drain pee (urine) out of your body. The tube goes in through your urethra. Your urethra is where pee comes out of your body. Your pee will come out through the catheter, then it will go into a bag (drainage bag). Take good care of your catheter so it will work well. How to wear your catheter and bag Supplies needed  Sticky tape (adhesive  tape) or a leg strap.  Alcohol wipe or soap and water (if you use tape).  A clean towel (if you use tape).  Large overnight bag.  Smaller bag (leg bag). Wearing your catheter Attach your catheter to your leg with tape or a leg strap.  Make sure the catheter is not pulled tight.  If a leg strap gets wet, take it off and put on a dry strap.  If you use tape to hold the bag on your leg: 1. Use an alcohol wipe or soap and water to wash your skin where the tape made it sticky before. 2. Use a clean towel to pat-dry that skin. 3. Use new tape to make the bag stay on your leg. Wearing your bags You should have been given a large overnight bag.  You may wear the overnight bag in the day or night.  Always have  the overnight bag lower than your bladder.  Do not let the bag touch the floor.  Before you go to sleep, put a clean plastic bag in a wastebasket. Then hang the overnight bag inside the wastebasket. You should also have a smaller leg bag that fits under your clothes.  Always wear the leg bag below your knee.  Do not wear your leg bag at night. How to care for your skin and catheter Supplies needed  A clean washcloth.  Water and mild soap.  A clean towel. Caring for your skin and catheter  Clean the skin around your catheter every day: 1. Wash your hands with soap and water. 2. Wet a clean washcloth in warm water and mild soap. 3. Clean the skin around your urethra.  If you are male:  Gently spread the folds of skin around your vagina (labia).  With the washcloth in your other hand, wipe the inner side of your labia on each side. Wipe from front to back.  If you are male:  Pull back any skin that covers the end of your penis (foreskin).  With the washcloth in your other hand, wipe your penis in small circles. Start wiping at the tip of your penis, then move away from the catheter.  Move the foreskin back in place, if needed. 4. With your free hand, hold the  catheter close to where it goes into your body.  Keep holding the catheter during cleaning so it does not get pulled out. 5. With the washcloth in your other hand, clean the catheter.  Only wipe downward on the catheter.  Do not wipe upward toward your body. Doing this may push germs into your urethra and cause infection. 6. Use a clean towel to pat-dry the catheter and the skin around it. Make sure to wipe off all soap. 7. Wash your hands with soap and water.  Shower every day. Do not take baths.  Do not use cream, ointment, or lotion on the area where the catheter goes into your body, unless your doctor tells you to.  Do not use powders, sprays, or lotions on your genital area.  Check your skin around the catheter every day for signs of infection. Check for: ? Redness, swelling, or pain. ? Fluid or blood. ? Warmth. ? Pus or a bad smell.      How to empty the bag Supplies needed  Rubbing alcohol.  Gauze pad or cotton ball.  Tape or a leg strap. Emptying the bag Pour the pee out of your bag when it is ?- full, or at least 2-3 times a day. Do this for your overnight bag and your leg bag. 1. Wash your hands with soap and water. 2. Separate (detach) the bag from your leg. 3. Hold the bag over the toilet or a clean pail. Keep the bag lower than your hips and bladder. This is so the pee (urine) does not go back into the tube. 4. Open the pour spout. It is at the bottom of the bag. 5. Empty the pee into the toilet or pail. Do not let the pour spout touch any surface. 6. Put rubbing alcohol on a gauze pad or cotton ball. 7. Use the gauze pad or cotton ball to clean the pour spout. 8. Close the pour spout. 9. Attach the bag to your leg with tape or a leg strap. 10. Wash your hands with soap and water. Follow instructions for cleaning the drainage bag:  From the product  maker.  As told by your doctor. How to change the bag Supplies needed  Alcohol wipes.  A clean  bag.  Tape or a leg strap. Changing the bag Replace your bag when it starts to leak, smell bad, or look dirty. 1. Wash your hands with soap and water. 2. Separate the dirty bag from your leg. 3. Pinch the catheter with your fingers so that pee does not spill out. 4. Separate the catheter tube from the bag tube where these tubes connect (at the connection valve). Do not let the tubes touch any surface. 5. Clean the end of the catheter tube with an alcohol wipe. Use a different alcohol wipe to clean the end of the bag tube. 6. Connect the catheter tube to the tube of the clean bag. 7. Attach the clean bag to your leg with tape or a leg strap. Do not make the bag tight on your leg. 8. Wash your hands with soap and water. General rules  Never pull on your catheter. Never try to take it out. Doing that can hurt you.  Always wash your hands before and after you touch your catheter or bag. Use a mild, fragrance-free soap. If you do not have soap and water, use hand sanitizer.  Always make sure there are no twists or bends (kinks) in the catheter tube.  Always make sure there are no leaks in the catheter or bag.  Drink enough fluid to keep your pee pale yellow.  Do not take baths, swim, or use a hot tub.  If you are male, wipe from front to back after you poop (have a bowel movement).   Contact a doctor if:  Your pee is cloudy.  Your pee smells worse than usual.  Your catheter gets clogged.  Your catheter leaks.  Your bladder feels full. Get help right away if:  You have redness, swelling, or pain where the catheter goes into your body.  You have fluid, blood, pus, or a bad smell coming from the area where the catheter goes into your body.  Your skin feels warm where the catheter goes into your body.  You have a fever.  You have pain in your: ? Belly (abdomen). ? Legs. ? Lower back. ? Bladder.  You see blood in the catheter.  Your pee is pink or red.  You feel  sick to your stomach (nauseous).  You throw up (vomit).  You have chills.  Your pee is not draining into the bag.  Your catheter gets pulled out. Summary  An indwelling urinary catheter is a thin tube that is placed into the bladder to help drain pee (urine) out of the body.  The catheter is placed into the part of the body that drains pee from the bladder (urethra).  Taking good care of your catheter will keep it working properly and help prevent problems.  Always wash your hands before and after touching your catheter or bag.  Never pull on your catheter or try to take it out. This information is not intended to replace advice given to you by your health care provider. Make sure you discuss any questions you have with your health care provider. Document Revised: 09/16/2018 Document Reviewed: 01/08/2017 Elsevier Patient Education  2021 [place] Anesthesia, Adult, Care After This sheet gives you information about how to care for yourself after your procedure. Your health care provider may also give you more specific instructions. If you have problems or questions, contact your  health care provider. What can I expect after the procedure? After the procedure, the following side effects are common:  Pain or discomfort at the IV site.  Nausea.  Vomiting.  Sore throat.  Trouble concentrating.  Feeling cold or chills.  Feeling weak or tired.  Sleepiness and fatigue.  Soreness and body aches. These side effects can affect parts of the body that were not involved in surgery. Follow these instructions at home: For the time period you were told by your health care provider:  Rest.  Do not participate in activities where you could fall or become injured.  Do not drive or use machinery.  Do not drink alcohol.  Do not take sleeping pills or medicines that cause drowsiness.  Do not make important decisions or sign legal documents.  Do not take care of  children on your own.   Eating and drinking  Follow any instructions from your health care provider about eating or drinking restrictions.  When you feel hungry, start by eating small amounts of foods that are soft and easy to digest (bland), such as toast. Gradually return to your regular diet.  Drink enough fluid to keep your urine pale yellow.  If you vomit, rehydrate by drinking water, juice, or clear broth. General instructions  If you have sleep apnea, surgery and certain medicines can increase your risk for breathing problems. Follow instructions from your health care provider about wearing your sleep device: ? Anytime you are sleeping, including during daytime naps. ? While taking prescription pain medicines, sleeping medicines, or medicines that make you drowsy.  Have a responsible adult stay with you for the time you are told. It is important to have someone help care for you until you are awake and alert.  Return to your normal activities as told by your health care provider. Ask your health care provider what activities are safe for you.  Take over-the-counter and prescription medicines only as told by your health care provider.  If you smoke, do not smoke without supervision.  Keep all follow-up visits as told by your health care provider. This is important. Contact a health care provider if:  You have nausea or vomiting that does not get better with medicine.  You cannot eat or drink without vomiting.  You have pain that does not get better with medicine.  You are unable to pass urine.  You develop a skin rash.  You have a fever.  You have redness around your IV site that gets worse. Get help right away if:  You have difficulty breathing.  You have chest pain.  You have blood in your urine or stool, or you vomit blood. Summary  After the procedure, it is common to have a sore throat or nausea. It is also common to feel tired.  Have a responsible adult  stay with you for the time you are told. It is important to have someone help care for you until you are awake and alert.  When you feel hungry, start by eating small amounts of foods that are soft and easy to digest (bland), such as toast. Gradually return to your regular diet.  Drink enough fluid to keep your urine pale yellow.  Return to your normal activities as told by your health care provider. Ask your health care provider what activities are safe for you. This information is not intended to replace advice given to you by your health care provider. Make sure you discuss any questions you have with your  health care provider. Document Revised: 02/08/2020 Document Reviewed: 09/07/2019 Elsevier Patient Education  2021 [employer].

## 2020-06-18 NOTE — Anesthesia Postprocedure Evaluation (Signed)
Anesthesia Post Note  Patient: Jerome Mays  Procedure(s) Performed: CYSTOSCOPY/URETEROSCOPY/HOLMIUM LASER/STENT EXCHANGE (Left Ureter)     Anesthesia Type: General Anesthetic complications: no   No complications documented.  Last Vitals:  Vitals:   06/18/20 1415 06/18/20 1430  BP:  129/73  Pulse:  76  Resp:    Temp:  36.5 C  SpO2: 91% (!) 88%    Last Pain:  Vitals:   06/18/20 1430  TempSrc:   PainSc: 8                  Derian Pfost S

## 2020-06-18 NOTE — Anesthesia Procedure Notes (Signed)
Procedure Name: LMA Insertion Date/Time: 06/18/2020 11:47 AM Performed by: Mitzie Na, CRNA Pre-anesthesia Checklist: Patient identified, Emergency Drugs available, Suction available and Patient being monitored Patient Re-evaluated:Patient Re-evaluated prior to induction Oxygen Delivery Method: Circle system utilized Preoxygenation: Pre-oxygenation with 100% oxygen Induction Type: IV induction LMA: LMA inserted LMA Size: 4.0 Number of attempts: 1 Placement Confirmation: positive ETCO2 and breath sounds checked- equal and bilateral Tube secured with: Tape Dental Injury: Teeth and Oropharynx as per pre-operative assessment

## 2020-06-18 NOTE — Transfer of Care (Signed)
Immediate Anesthesia Transfer of Care Note  Patient: Jerome Mays  Procedure(s) Performed: CYSTOSCOPY/URETEROSCOPY/HOLMIUM LASER/STENT EXCHANGE (Left Ureter)  Patient Location: PACU  Anesthesia Type:General  Level of Consciousness: awake, alert  and oriented  Airway & Oxygen Therapy: Patient Spontanous Breathing and Patient connected to face mask oxygen  Post-op Assessment: Report given to RN and Post -op Vital signs reviewed and stable  Post vital signs: Reviewed and stable  Last Vitals:  Vitals Value Taken Time  BP 135/76 06/18/20 1325  Temp    Pulse 72 06/18/20 1327  Resp 16 06/18/20 1327  SpO2 100 % 06/18/20 1327  Vitals shown include unvalidated device data.  Last Pain:  Vitals:   06/18/20 1118  TempSrc:   PainSc: 0-No pain         Complications: No complications documented.

## 2020-06-19 ENCOUNTER — Encounter (HOSPITAL_COMMUNITY): Payer: Self-pay | Admitting: Urology

## 2020-06-20 ENCOUNTER — Encounter: Payer: Self-pay | Admitting: Pulmonary Disease

## 2020-06-20 ENCOUNTER — Telehealth: Payer: Self-pay | Admitting: Pulmonary Disease

## 2020-06-20 ENCOUNTER — Other Ambulatory Visit: Payer: Self-pay | Admitting: *Deleted

## 2020-06-20 DIAGNOSIS — I8290 Acute embolism and thrombosis of unspecified vein: Secondary | ICD-10-CM

## 2020-06-20 DIAGNOSIS — I2699 Other pulmonary embolism without acute cor pulmonale: Secondary | ICD-10-CM

## 2020-06-20 DIAGNOSIS — N201 Calculus of ureter: Secondary | ICD-10-CM | POA: Diagnosis not present

## 2020-06-20 DIAGNOSIS — R310 Gross hematuria: Secondary | ICD-10-CM | POA: Diagnosis not present

## 2020-06-20 MED ORDER — APIXABAN 5 MG PO TABS: 5.0000 mg | ORAL_TABLET | Freq: Two times a day (BID) | ORAL | 3 refills | Status: DC

## 2020-06-20 NOTE — Telephone Encounter (Signed)
06/20/20  Spoke with patient's spouse.  She reports patient is doing well status post cystostomy.  He has gone back to urology today.  He was maintained on Eliquis the entire time.  He has no acute respiratory concerns.  He does need refills of Eliquis  Refills have been placed.  Encourage patient to keep upcoming appointment with Dr. Vaughan Browner in March/2022.  If patient symptoms acutely worsen or he has changes in his respiratory status they are well aware to contact our office for an earlier appointment.  Nothing further needed.  Wyn Quaker, FNP

## 2020-06-20 NOTE — Patient Outreach (Signed)
[facility]) Care Management  06/20/2020  Jerome Mays Sep 27, 1940 [id number]   [place] CoordinationOutreach  Referral Date:06/03/2020 Referral Source:EMMI Red Alert Reason for Referral:EMMI Red Alert General Discharge Insurance:[employer] Medicare   Outreach Attempt:  Received incoming call from patient's wife.  HIPAA verified with wife.  Wife stating patient has had procedure this week to remove his renal stone.  She is asking if patient is needing a sooner appointment with pulmonology (next appointment scheduled for March) since he has had the procedure to remove his renal stones.  Verified with wife patient was seen by pulmonology on 06/12/2020.  Wife states patient is doing well and does not have any increase in shortness of breath.  She is requesting this RN Care Coordinator contact pulmonology and verify appointment need or if March follow up is ok.  RN Care Coordinator outreached to pulmonology [phone] Dr. Vaughan Browner) and spoke with Apolonio Schneiders.  Message left with Apolonio Schneiders to have nurse return call to patient's wife to verify timing of next appointment needed for Pulmonology.  Wife on line, updated, and very appreciative.  Plan:  RN Care Coordinator will make next telephone outreach as previously scheduled and wife agrees to follow up outreach.   [place] Management  RN Telephonic Care Coordinator [phone] [email or web address]

## 2020-06-23 ENCOUNTER — Emergency Department (HOSPITAL_COMMUNITY): Payer: Medicare HMO

## 2020-06-23 ENCOUNTER — Encounter (HOSPITAL_COMMUNITY): Payer: Self-pay | Admitting: *Deleted

## 2020-06-23 ENCOUNTER — Inpatient Hospital Stay (HOSPITAL_COMMUNITY)
Admission: EM | Admit: 2020-06-23 | Discharge: 2020-06-26 | DRG: 690 | Disposition: A | Discharge status: Home Health | Payer: Medicare HMO | Attending: Family Medicine | Admitting: Family Medicine

## 2020-06-23 ENCOUNTER — Other Ambulatory Visit: Payer: Self-pay

## 2020-06-23 DIAGNOSIS — Z20822 Contact with and (suspected) exposure to covid-19: Secondary | ICD-10-CM | POA: Diagnosis present

## 2020-06-23 DIAGNOSIS — R1084 Generalized abdominal pain: Secondary | ICD-10-CM | POA: Diagnosis not present

## 2020-06-23 DIAGNOSIS — R109 Unspecified abdominal pain: Secondary | ICD-10-CM | POA: Diagnosis not present

## 2020-06-23 DIAGNOSIS — Z6823 Body mass index (BMI) 23.0-23.9, adult: Secondary | ICD-10-CM

## 2020-06-23 DIAGNOSIS — N200 Calculus of kidney: Secondary | ICD-10-CM | POA: Diagnosis not present

## 2020-06-23 DIAGNOSIS — E785 Hyperlipidemia, unspecified: Secondary | ICD-10-CM | POA: Diagnosis present

## 2020-06-23 DIAGNOSIS — R0902 Hypoxemia: Secondary | ICD-10-CM | POA: Diagnosis not present

## 2020-06-23 DIAGNOSIS — J432 Centrilobular emphysema: Secondary | ICD-10-CM | POA: Diagnosis present

## 2020-06-23 DIAGNOSIS — R52 Pain, unspecified: Secondary | ICD-10-CM

## 2020-06-23 DIAGNOSIS — Z7901 Long term (current) use of anticoagulants: Secondary | ICD-10-CM

## 2020-06-23 DIAGNOSIS — R319 Hematuria, unspecified: Secondary | ICD-10-CM | POA: Diagnosis not present

## 2020-06-23 DIAGNOSIS — Z9842 Cataract extraction status, left eye: Secondary | ICD-10-CM

## 2020-06-23 DIAGNOSIS — I2699 Other pulmonary embolism without acute cor pulmonale: Secondary | ICD-10-CM | POA: Diagnosis present

## 2020-06-23 DIAGNOSIS — Z905 Acquired absence of kidney: Secondary | ICD-10-CM

## 2020-06-23 DIAGNOSIS — R310 Gross hematuria: Secondary | ICD-10-CM | POA: Diagnosis not present

## 2020-06-23 DIAGNOSIS — R531 Weakness: Secondary | ICD-10-CM | POA: Diagnosis not present

## 2020-06-23 DIAGNOSIS — Z7951 Long term (current) use of inhaled steroids: Secondary | ICD-10-CM

## 2020-06-23 DIAGNOSIS — Z96652 Presence of left artificial knee joint: Secondary | ICD-10-CM | POA: Diagnosis present

## 2020-06-23 DIAGNOSIS — N136 Pyonephrosis: Secondary | ICD-10-CM | POA: Diagnosis not present

## 2020-06-23 DIAGNOSIS — E441 Mild protein-calorie malnutrition: Secondary | ICD-10-CM | POA: Diagnosis not present

## 2020-06-23 DIAGNOSIS — Z888 Allergy status to other drugs, medicaments and biological substances status: Secondary | ICD-10-CM

## 2020-06-23 DIAGNOSIS — N390 Urinary tract infection, site not specified: Secondary | ICD-10-CM | POA: Diagnosis not present

## 2020-06-23 DIAGNOSIS — Z86711 Personal history of pulmonary embolism: Secondary | ICD-10-CM

## 2020-06-23 DIAGNOSIS — Z9841 Cataract extraction status, right eye: Secondary | ICD-10-CM

## 2020-06-23 DIAGNOSIS — W1830XA Fall on same level, unspecified, initial encounter: Secondary | ICD-10-CM | POA: Diagnosis present

## 2020-06-23 DIAGNOSIS — N1330 Unspecified hydronephrosis: Secondary | ICD-10-CM | POA: Diagnosis not present

## 2020-06-23 DIAGNOSIS — Z8249 Family history of ischemic heart disease and other diseases of the circulatory system: Secondary | ICD-10-CM

## 2020-06-23 DIAGNOSIS — Z86010 Personal history of colonic polyps: Secondary | ICD-10-CM

## 2020-06-23 DIAGNOSIS — I2782 Chronic pulmonary embolism: Secondary | ICD-10-CM

## 2020-06-23 DIAGNOSIS — M109 Gout, unspecified: Secondary | ICD-10-CM | POA: Diagnosis present

## 2020-06-23 DIAGNOSIS — M1990 Unspecified osteoarthritis, unspecified site: Secondary | ICD-10-CM | POA: Diagnosis present

## 2020-06-23 DIAGNOSIS — Z79899 Other long term (current) drug therapy: Secondary | ICD-10-CM

## 2020-06-23 DIAGNOSIS — J449 Chronic obstructive pulmonary disease, unspecified: Secondary | ICD-10-CM

## 2020-06-23 DIAGNOSIS — K5900 Constipation, unspecified: Secondary | ICD-10-CM | POA: Diagnosis not present

## 2020-06-23 DIAGNOSIS — G1221 Amyotrophic lateral sclerosis: Secondary | ICD-10-CM | POA: Diagnosis present

## 2020-06-23 DIAGNOSIS — Z85528 Personal history of other malignant neoplasm of kidney: Secondary | ICD-10-CM

## 2020-06-23 DIAGNOSIS — Z825 Family history of asthma and other chronic lower respiratory diseases: Secondary | ICD-10-CM

## 2020-06-23 DIAGNOSIS — M545 Low back pain, unspecified: Secondary | ICD-10-CM | POA: Diagnosis not present

## 2020-06-23 DIAGNOSIS — K219 Gastro-esophageal reflux disease without esophagitis: Secondary | ICD-10-CM | POA: Diagnosis present

## 2020-06-23 DIAGNOSIS — Z743 Need for continuous supervision: Secondary | ICD-10-CM | POA: Diagnosis not present

## 2020-06-23 DIAGNOSIS — Z87442 Personal history of urinary calculi: Secondary | ICD-10-CM

## 2020-06-23 DIAGNOSIS — Z87891 Personal history of nicotine dependence: Secondary | ICD-10-CM

## 2020-06-23 DIAGNOSIS — I2510 Atherosclerotic heart disease of native coronary artery without angina pectoris: Secondary | ICD-10-CM | POA: Diagnosis present

## 2020-06-23 LAB — URINALYSIS, ROUTINE W REFLEX MICROSCOPIC
Bilirubin Urine: NEGATIVE
Glucose, UA: NEGATIVE mg/dL
Ketones, ur: NEGATIVE mg/dL
Nitrite: NEGATIVE
Protein, ur: 100 mg/dL — AB
RBC / HPF: >50 RBC/hpf — ABNORMAL HIGH (ref 0–5)
Specific Gravity, Urine: 1.013 (ref 1.005–1.030)
WBC, UA: >50 WBC/hpf — ABNORMAL HIGH (ref 0–5)
pH: 7 (ref 5.0–8.0)

## 2020-06-23 LAB — COMPREHENSIVE METABOLIC PANEL
ALT: 16 U/L (ref 0–44)
AST: 22 U/L (ref 15–41)
Albumin: 3.3 g/dL — ABNORMAL LOW (ref 3.5–5.0)
Alkaline Phosphatase: 67 U/L (ref 38–126)
Anion gap: 9 (ref 5–15)
BUN: 17 mg/dL (ref 8–23)
CO2: 24 mmol/L (ref 22–32)
Calcium: 9.4 mg/dL (ref 8.9–10.3)
Chloride: 103 mmol/L (ref 98–111)
Creatinine, Ser: 0.92 mg/dL (ref 0.61–1.24)
GFR, Estimated: >60 mL/min (ref >60)
Glucose, Bld: 136 mg/dL — ABNORMAL HIGH (ref 70–99)
Potassium: 4.1 mmol/L (ref 3.5–5.1)
Sodium: 136 mmol/L (ref 135–145)
Total Bilirubin: 0.8 mg/dL (ref 0.3–1.2)
Total Protein: 6.3 g/dL — ABNORMAL LOW (ref 6.5–8.1)

## 2020-06-23 LAB — CBC WITH DIFFERENTIAL/PLATELET
Abs Immature Granulocytes: 0.06 10*3/uL (ref 0.00–0.07)
Basophils Absolute: 0 10*3/uL (ref 0.0–0.1)
Basophils Relative: 0 %
Eosinophils Absolute: 0 10*3/uL (ref 0.0–0.5)
Eosinophils Relative: 0 %
HCT: 29.9 % — ABNORMAL LOW (ref 39.0–52.0)
Hemoglobin: 8.8 g/dL — ABNORMAL LOW (ref 13.0–17.0)
Immature Granulocytes: 1 %
Lymphocytes Relative: 4 %
Lymphs Abs: 0.5 10*3/uL — ABNORMAL LOW (ref 0.7–4.0)
MCH: 27 pg (ref 26.0–34.0)
MCHC: 29.4 g/dL — ABNORMAL LOW (ref 30.0–36.0)
MCV: 91.7 fL (ref 80.0–100.0)
Monocytes Absolute: 0.8 10*3/uL (ref 0.1–1.0)
Monocytes Relative: 6 %
Neutro Abs: 10.8 10*3/uL — ABNORMAL HIGH (ref 1.7–7.7)
Neutrophils Relative %: 89 %
Platelets: 221 10*3/uL (ref 150–400)
RBC: 3.26 MIL/uL — ABNORMAL LOW (ref 4.22–5.81)
RDW: 17.4 % — ABNORMAL HIGH (ref 11.5–15.5)
WBC: 12.2 10*3/uL — ABNORMAL HIGH (ref 4.0–10.5)
nRBC: 0 % (ref 0.0–0.2)

## 2020-06-23 LAB — RESP PANEL BY RT-PCR (FLU A&B, COVID) ARPGX2
Influenza A by PCR: NEGATIVE
Influenza B by PCR: NEGATIVE
SARS Coronavirus 2 by RT PCR: NEGATIVE

## 2020-06-23 MED ORDER — SODIUM CHLORIDE 0.9 % IV SOLN
1.0000 g | INTRAVENOUS | Status: DC
  Administered 2020-06-24 – 2020-06-25 (×2): 1 g via INTRAVENOUS
  Filled 2020-06-23 (×2): qty 10

## 2020-06-23 MED ORDER — ACETAMINOPHEN 325 MG PO TABS: 650.0000 mg | ORAL_TABLET | Freq: Four times a day (QID) | ORAL | Status: DC | PRN

## 2020-06-23 MED ORDER — ADULT MULTIVITAMIN W/MINERALS CH
1.0000 | ORAL_TABLET | Freq: Every day | ORAL | Status: DC
  Administered 2020-06-25 – 2020-06-26 (×2): 1 via ORAL
  Filled 2020-06-23 (×2): qty 1

## 2020-06-23 MED ORDER — UMECLIDINIUM BROMIDE 62.5 MCG/INH IN AEPB
1.0000 | INHALATION_SPRAY | Freq: Every day | RESPIRATORY_TRACT | Status: DC
  Administered 2020-06-25 – 2020-06-26 (×2): 1 via RESPIRATORY_TRACT
  Filled 2020-06-23: qty 7

## 2020-06-23 MED ORDER — RILUZOLE 50 MG PO TABS
50.0000 mg | ORAL_TABLET | Freq: Two times a day (BID) | ORAL | Status: DC
  Administered 2020-06-24 – 2020-06-26 (×5): 50 mg via ORAL
  Filled 2020-06-23 (×6): qty 1

## 2020-06-23 MED ORDER — ALBUTEROL SULFATE (2.5 MG/3ML) 0.083% IN NEBU: 2.5000 mg | INHALATION_SOLUTION | Freq: Four times a day (QID) | RESPIRATORY_TRACT | Status: DC | PRN

## 2020-06-23 MED ORDER — ACETAMINOPHEN 650 MG RE SUPP: 650.0000 mg | Freq: Four times a day (QID) | RECTAL | Status: DC | PRN

## 2020-06-23 MED ORDER — KETOROLAC TROMETHAMINE 15 MG/ML IJ SOLN: 15.0000 mg | Freq: Three times a day (TID) | INTRAMUSCULAR | Status: AC | PRN

## 2020-06-23 MED ORDER — SODIUM CHLORIDE 0.9 % IV SOLN
1.0000 g | Freq: Once | INTRAVENOUS | Status: AC
  Administered 2020-06-23: 1 g via INTRAVENOUS
  Filled 2020-06-23: qty 10

## 2020-06-23 MED ORDER — ONDANSETRON HCL 4 MG/2ML IJ SOLN: 4.0000 mg | Freq: Four times a day (QID) | INTRAMUSCULAR | Status: DC | PRN

## 2020-06-23 MED ORDER — POLYETHYLENE GLYCOL 3350 17 G PO PACK: 17.0000 g | PACK | Freq: Every day | ORAL | Status: DC | PRN

## 2020-06-23 MED ORDER — FLUTICASONE-UMECLIDIN-VILANT 200-62.5-25 MCG/INH IN AEPB: 1.0000 | INHALATION_SPRAY | Freq: Every day | RESPIRATORY_TRACT | Status: DC

## 2020-06-23 MED ORDER — B COMPLEX PO TABS: 1.0000 | ORAL_TABLET | Freq: Every day | ORAL | Status: DC

## 2020-06-23 MED ORDER — APIXABAN 5 MG PO TABS
5.0000 mg | ORAL_TABLET | Freq: Two times a day (BID) | ORAL | Status: DC
  Administered 2020-06-23: 5 mg via ORAL
  Filled 2020-06-23: qty 1

## 2020-06-23 MED ORDER — FLUTICASONE FUROATE-VILANTEROL 200-25 MCG/INH IN AEPB
1.0000 | INHALATION_SPRAY | Freq: Every day | RESPIRATORY_TRACT | Status: DC
  Administered 2020-06-24 – 2020-06-26 (×3): 1 via RESPIRATORY_TRACT
  Filled 2020-06-23: qty 28

## 2020-06-23 MED ORDER — OXYCODONE-ACETAMINOPHEN 5-325 MG PO TABS: 1.0000 | ORAL_TABLET | Freq: Four times a day (QID) | ORAL | Status: DC | PRN

## 2020-06-23 MED ORDER — BOOST / RESOURCE BREEZE PO LIQD CUSTOM
1.0000 | Freq: Two times a day (BID) | ORAL | Status: DC
  Filled 2020-06-23 (×4): qty 1

## 2020-06-23 MED ORDER — LORATADINE 10 MG PO TABS
10.0000 mg | ORAL_TABLET | Freq: Every day | ORAL | Status: DC
  Administered 2020-06-25 – 2020-06-26 (×2): 10 mg via ORAL
  Filled 2020-06-23 (×2): qty 1

## 2020-06-23 MED ORDER — ONDANSETRON HCL 4 MG PO TABS: 4.0000 mg | ORAL_TABLET | Freq: Four times a day (QID) | ORAL | Status: DC | PRN

## 2020-06-23 NOTE — ED Triage Notes (Signed)
Pt c/o left flank pain that started last night. Pt also c/o bloody urine and difficulty urinating. Pt had lithotripsy approximately 2 weeks ago and had his foley catheter removed a few days ago.

## 2020-06-23 NOTE — ED Provider Notes (Addendum)
[facility] EMERGENCY DEPARTMENT Provider Note   CSN: [id number] Arrival date & time: 06/23/20  1422     History Chief Complaint  Patient presents with  . Flank Pain    Jerome Mays is a 80 y.o. male.  The history is provided by the patient. No language interpreter was used.  Flank Pain This is a new problem. The current episode started 12 to 24 hours ago. The problem occurs constantly. The problem has been gradually worsening. Associated symptoms include abdominal pain. Nothing aggravates the symptoms. Nothing relieves the symptoms. He has tried nothing for the symptoms. The treatment provided no relief.  Pt complains of pain in his left back.  Pt reports he had a stent placed 2 weeks ago.  Pt reports has had continued blood in urine.  Pt reports increased left back pain and abdominal pain. Pt fell yesterday and wife reports she had to have help to get pt up.  Pt has ALS and normally walks.  Pt has no arm or hand use.   Pt is on 2 liters 25f 02 at home     Past Medical History:  Diagnosis Date  . ALS (amyotrophic lateral sclerosis) ([place]) 2021  . Anemia    low iron  . Arthritis   . Atypical chest pain 01/04/2020  . Colonic polyp   . Constipation   . COPD (chronic obstructive pulmonary disease) ([place])   . Coronary artery calcification seen on CAT scan 01/04/2020  . GERD (gastroesophageal reflux disease)   . Gout   . History of kidney stones   . Malignant tumor of kidney ([place])   . Muscle atrophy   . Pain    LOWER BACK AND LEFT HIP - HX OF PREVIOUS LUMBAR AND CERVICAL SURGERY--PT STATES HE HAS HAD NUMBNESS IN BOTH FEET SINCE HIS BACK SURGERY  . Pure hypercholesterolemia 01/04/2020  . Renal calculus   . Retinal detachment   . Shortness of breath 01/04/2020    Patient Active Problem List   Diagnosis Date Noted  . Chronic respiratory failure with hypoxia ([place]) 06/12/2020  . AKI (acute kidney injury) ([place]) 05/30/2020  . Pulmonary emboli ([place]) 05/29/2020  .  Hydroureteronephrosis 05/29/2020  . Mild protein malnutrition ([place]) 05/29/2020  . GERD (gastroesophageal reflux disease)   . ALS (amyotrophic lateral sclerosis) ([place]) 02/13/2020  . Atypical chest pain 01/04/2020  . Coronary artery calcification seen on CAT scan 01/04/2020  . Pure hypercholesterolemia 01/04/2020  . Shortness of breath 01/04/2020  . 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 Weakness 05/15/2019  . Muscular atrophy 05/15/2019  . Fasciculation 05/15/2019  . Muscle weakness (generalized) 05/15/2019  . H/O parotidectomy 06/13/2018  . 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 COPD, group B, by GOLD 2017 classification ([place]) 02/02/2017  . Hemoptysis 06/18/2016  . Benign prostatic hyperplasia with lower urinary tract symptoms 02/07/2016  . Stiffness of left knee 11/08/2012  . Difficulty in walking(719.7) 11/08/2012  . Gout 03/04/2012  . Renal cell cancer ([place]) 01/27/2012  . 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 ENLARGEMENT OF LYMPH NODES 08/21/2009  . CLOS FRACTURE MID/PROXIMAL PHALANX/PHALANG HAND 07/31/2009  . 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 COPD with chronic bronchitis ([place]) 12/06/2007  . COLONIC POLYPS, HX OF 12/05/2007  . RENAL CALCULUS, HX OF 12/05/2007    Past Surgical History:  Procedure Laterality Date  . BACK SURGERY     LUMBAR SURGERY  . CATARACT EXTRACTION Bilateral 2003   Surgeon unknown  . CYSTOSCOPY W/ URETERAL STENT PLACEMENT Left 05/30/2020   Procedure:  CYSTOSCOPY URETERAL STENT PLACEMENT;  Surgeon: Remi Haggard, MD;  Location: [facility];  Service: Urology;  Laterality: Left;  . CYSTOSCOPY/URETEROSCOPY/HOLMIUM LASER/STENT PLACEMENT Left 06/18/2020   Procedure: CYSTOSCOPY/URETEROSCOPY/HOLMIUM LASER/STENT EXCHANGE;  Surgeon: Remi Haggard, MD;  Location: [facility];  Service: Urology;  Laterality: Left;  1 HR  . EYE SURGERY     BILATERAL CATARACT EXTRACTIONS  . GIVENS CAPSULE STUDY N/A 10/07/2015   Procedure: GIVENS CAPSULE STUDY;  Surgeon: Carol Ada, MD;  Location: [place];  Service: Endoscopy;  Laterality: N/A;  . HAND SURGERY     left  . KNEE SURGERY     right  . LAMINECTOMY     cervical  . LASER PHOTO ABLATION Left 08/19/2017   Procedure: LASER PHOTO ABLATION;  Surgeon: [name], MD;  Location: [place];  Service: Ophthalmology;  Laterality: Left;  . MEMBRANE PEEL Left 03/22/2017   Procedure: POSSIBLE MEMBRANE PEEL WITH SILICONE OIL AND PERFLURON;  Surgeon: [name], MD;  Location: [place];  Service: Ophthalmology;  Laterality: Left;  . NEPHRECTOMY     PT STATES RIGHT KIDNEY WAS REMOVED  . PAROTIDECTOMY Right 06/13/2018   Procedure: RIGHT TOTAL PAROTIDECTOMY;  Surgeon: Leta Baptist, MD;  Location: [place];  Service: ENT;  Laterality: Right;  . PARS PLANA VITRECTOMY Left 03/22/2017   Procedure: PARS PLANA VITRECTOMY WITH 25 GAUGE;  Surgeon: [name], MD;  Location: [place];  Service: Ophthalmology;  Laterality: Left;  . PARS PLANA VITRECTOMY W/ SCLERAL BUCKLE Left 08/19/2017   Procedure: 37 GAUGE PARS PLANA VITRECTOMY WITH SILCONE OIL REMOVAL;  Surgeon: [name], MD;  Location: [place];  Service: Ophthalmology;  Laterality: Left;  . 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 SALIVARY STONE REMOVAL Right 02/21/2019   Procedure: RIGHT PAROTID FISTULA REPAIR;  Surgeon: Leta Baptist, MD;  Location: [place];  Service: ENT;  Laterality: Right;  . SHOULDER SURGERY  2020   [facility]  . TOTAL KNEE ARTHROPLASTY Left 10/19/2012   Procedure: LEFT TOTAL KNEE ARTHROPLASTY;  Surgeon: Magnus Sinning, MD;  Location[facility];  Service: Orthopedics;  Laterality: Left;  [name] VITRECTOMY 25 GAUGE WITH SCLERAL BUCKLE Left 02/17/2017   Procedure: VITRECTOMY 25 GAUGE WITH SCLERAL BUCKLE membrane peel, injection of silicone oil, and endolaser photocoagulation.;  Surgeon: [name], MD;  Location: [place];  Service: Ophthalmology;  Laterality: Left;       Family History  Problem Relation Age of Onset  . Diabetes Mother    . COPD Brother   . COPD Sister   . Heart attack Father   . Amblyopia Neg Hx   . Blindness Neg Hx   . Cataracts Neg Hx   . Glaucoma Neg Hx   . Macular degeneration Neg Hx   . Retinal detachment Neg Hx   . Retinitis pigmentosa Neg Hx     Social History   Tobacco Use  . Smoking status: Former Smoker    Packs/day: 2.00    Years: 40.00    Pack years: 80.00    Types: Cigarettes, Pipe    Quit date: 06/09/1995    Years since quitting: 25.0  . Smokeless tobacco: Never Used  Vaping Use  . Vaping Use: Never used  Substance Use Topics  . Alcohol use: No  . Drug use: No    Home Medications Prior to Admission medications   Medication Sig Start Date End Date Taking? Authorizing Provider  allopurinol (ZYLOPRIM) 300 MG tablet Take 300 mg by mouth daily.  [provider]  apixaban (ELIQUIS) 5 MG TABS tablet Take 1 tablet (5 mg total) by mouth 2 (two) times daily. 06/20/20   Lauraine Rinne, NP  b complex vitamins tablet Take 1 tablet by mouth daily.    [provider]  cetirizine (ZYRTEC) 10 MG tablet Take 10 mg by mouth daily.    [provider]  Cholecalciferol (VITAMIN D3) 50 MCG (2000 UT) TABS Take by mouth.    [provider]  clobetasol (TEMOVATE) 0.05 % external solution Apply 1 application topically daily as needed. 06/11/20   [provider]  ketoconazole (NIZORAL) 2 % shampoo Apply 1 application topically every other day. 06/12/20   [provider]  ketorolac (ACULAR) 0.5 % ophthalmic solution Place 1 drop into the left eye 4 (four) times daily. Patient taking differently: Place 1 drop into the left eye in the morning and at bedtime. 08/29/19 08/28/20  [name], MD  Multiple Vitamin (MULTIVITAMIN WITH MINERALS) TABS tablet Take 1 tablet by mouth daily.    [provider]  mupirocin ointment (BACTROBAN) 2 % Apply 1 application topically daily.    [provider]  NON FORMULARY     [provider]   oxyCODONE-acetaminophen (PERCOCET) 5-325 MG tablet Take 1 tablet by mouth every 6 (six) hours as needed for severe pain. 06/18/20 06/18/21  Remi Haggard, MD  pramoxine (CERAVE ITCH RELIEF) 1 % LOTN Apply 1 application topically 2 (two) times daily as needed (itching/dry skin.).    [provider]  prednisoLONE acetate (PRED FORTE) 1 % ophthalmic suspension Place 1 drop into the left eye 4 (four) times daily. Patient taking differently: Place 1 drop into the left eye in the morning and at bedtime. 04/05/20   [name], MD  riluzole (RILUTEK) 50 MG tablet Take 1 tablet (50 mg total) by mouth every 12 (twelve) hours. 07/06/19   Marcial Pacas, MD  sodium chloride (MURO 128) 5 % ophthalmic solution Place 1 drop into the left eye in the morning, at noon, in the evening, and at bedtime. Patient taking differently: Place 1 drop into the left eye in the morning and at bedtime. 03/10/20   [name], MD  [place] 200-62.5-25 MCG/INH AEPB INHALE 1 PUFF ONCE DAILY Patient taking differently: Inhale 1 puff into the lungs daily. 06/11/20   Mannam, Hart Robinsons, MD  triamcinolone (KENALOG) 0.1 % Apply 1 application topically 2 (two) times daily as needed (skin irritation.). 06/12/20   [provider]    Allergies    Lipitor [atorvastatin]  Review of Systems   Review of Systems  Gastrointestinal: Positive for abdominal pain.  Genitourinary: Positive for flank pain.  All other systems reviewed and are negative.   Physical Exam Updated Vital Signs BP 136/76   Pulse 98   Temp 98.7 F (37.1 C) (Oral)   Resp 18   Ht 6\' 1"  (1.854 m)   Wt 80.7 kg   SpO2 (!) 88%   BMI 23.48 kg/m   Physical Exam Vitals and nursing note reviewed.  Constitutional:      Appearance: He is well-developed and well-nourished.  HENT:     Head: Normocephalic and atraumatic.  Eyes:     Conjunctiva/sclera: Conjunctivae normal.  Cardiovascular:     Rate and Rhythm: Normal rate and regular rhythm.      Heart sounds: No murmur heard.   Pulmonary:     Effort: Pulmonary effort is normal. No respiratory distress.     Breath sounds: Normal breath  sounds.  Abdominal:     Palpations: Abdomen is soft.     Tenderness: There is no abdominal tenderness.  Musculoskeletal:        General: No edema.     Cervical back: Neck supple.  Skin:    General: Skin is warm and dry.  Neurological:     Mental Status: He is alert.     Comments: Minimal/no movement of hands and arms.  Pt able to lift legs.   Psychiatric:        Mood and Affect: Mood and affect and mood normal.     ED Results / Procedures / Treatments   Labs (all labs ordered are listed, but only abnormal results are displayed) Labs Reviewed  URINALYSIS, ROUTINE W REFLEX MICROSCOPIC - Abnormal; Notable for the following components:      Result Value   Color, Urine RED (*)    APPearance CLOUDY (*)    Hgb urine dipstick LARGE (*)    Protein, ur 100 (*)    Leukocytes,Ua MODERATE (*)    RBC / HPF >50 (*)    WBC, UA >50 (*)    Bacteria, UA MANY (*)    All other components within normal limits  CBC WITH DIFFERENTIAL/PLATELET - Abnormal; Notable for the following components:   WBC 12.2 (*)    RBC 3.26 (*)    Hemoglobin 8.8 (*)    HCT 29.9 (*)    MCHC 29.4 (*)    RDW 17.4 (*)    Neutro Abs 10.8 (*)    Lymphs Abs 0.5 (*)    All other components within normal limits  COMPREHENSIVE METABOLIC PANEL - Abnormal; Notable for the following components:   Glucose, Bld 136 (*)    Total Protein 6.3 (*)    Albumin 3.3 (*)    All other components within normal limits  RESP PANEL BY RT-PCR (FLU A&B, COVID) [id number]    EKG None  Radiology CT ABDOMEN PELVIS WO CONTRAST  Result Date: 06/23/2020 CLINICAL DATA:  Hematuria, unknown cause. Left-sided abdominal pain. Nausea and constipation. EXAM: CT ABDOMEN AND PELVIS WITHOUT CONTRAST CT LUMBAR SPINE without contrast TECHNIQUE: Multidetector CT imaging of the abdomen and pelvis was performed  following the standard protocol without IV contrast. Multiplanar CT reconstructions of the lumbar spine. COMPARISON:  CT abdomen pelvis 05/29/2020 FINDINGS: Lower chest: Coronary artery calcifications. Bilateral lower lobe atelectasis again noted. Calcified granuloma at the right base. Hepatobiliary: No focal liver abnormality. No gallstones, gallbladder wall thickening, or pericholecystic fluid. No biliary dilatation. Pancreas: No focal lesion. Normal pancreatic contour. No surrounding inflammatory changes. No main pancreatic ductal dilatation. Spleen: Normal in size without focal abnormality. Adrenals/Urinary Tract: No adrenal nodule bilaterally. Status post right nephrectomy. Interval placement of a left ureteral stent with proximal pigtail terminating within a an inferior pole calyx and distal pigtail terminating within the urinary bladder lumen. Interval development of Peri-ureteral fat stranding and interval worsening of mild hydroureteronephrosis on the left. Several nephrolithiasis (2:2:24, 27,30). Couple of 2-4 mm calcifications along the ureteral stent in the mid ureter (2:50). Previously noted proximal ureteral stones are no longer visualized. Couple of layering punctate calcifications within the lumen of the urinary bladder wall (2:74). Hypodensity within the inferior pole left kidney likely represents a simple renal cyst. The urinary bladder is otherwise unremarkable. Stomach/Bowel: Stomach is within normal limits. No evidence of bowel wall thickening or dilatation. The appendix not definitely identified. Vascular/Lymphatic: No abdominal aorta or iliac aneurysm. Severe atherosclerotic plaque of the aorta and its  branches. Scattered descending and sigmoid diverticulosis. No abdominal, pelvic, or inguinal lymphadenopathy. Reproductive: Penile pump implant with reservoir within the anterior lower abdomen. No inflammatory changes surrounding the reservoir noted on this noncontrast study. Other: No  intraperitoneal free fluid. No intraperitoneal free gas. No organized fluid collection. Musculoskeletal: No abdominal wall hernia or abnormality. No suspicious lytic or blastic osseous lesions. No acute displaced fracture. CT LUMBAR SPINE: Segmentation: 5 non-rib-bearing lumbar vertebral bodies. Alignment: Normal. Vertebral body/inter vertebral spaces: Multilevel moderate severe degenerative change of the spine with moderate severe intervertebral disc space narrowing. No suspicious lytic or blastic osseous lesions. Redemonstration of a well-defined sclerotic lesion within the L3 vertebral body that likely represents a bone island. No severe osseous neural foraminal or central canal stenosis. Soft tissues: Unremarkable. IMPRESSION: 1. Interval placement of a left ureteral stent in grossly appropriate position. Couple associated 2 and 13mm ureterolithiasis within the mid left ureter. Interval slight worsening of mild left hydroureteronephrosis. Associated peri-ureteral fat stranding may be reactive versus could represent an infection with limited evaluation on this noncontrast study. Correlate with urinalysis for infection. 2. Status post right nephrectomy. 3. Colonic diverticulosis with no acute diverticulitis. 4. No acute displaced fracture or traumatic listhesis of the lumbar spine in a patient with multilevel severe degenerative changes. Electronically Signed   By: Iven Finn M.D.   On: 06/23/2020 15:56   CT L-SPINE NO CHARGE  Result Date: 06/23/2020 CLINICAL DATA:  Hematuria, unknown cause. Left-sided abdominal pain. Nausea and constipation. EXAM: CT ABDOMEN AND PELVIS WITHOUT CONTRAST CT LUMBAR SPINE without contrast TECHNIQUE: Multidetector CT imaging of the abdomen and pelvis was performed following the standard protocol without IV contrast. Multiplanar CT reconstructions of the lumbar spine. COMPARISON:  CT abdomen pelvis 05/29/2020 FINDINGS: Lower chest: Coronary artery calcifications. Bilateral  lower lobe atelectasis again noted. Calcified granuloma at the right base. Hepatobiliary: No focal liver abnormality. No gallstones, gallbladder wall thickening, or pericholecystic fluid. No biliary dilatation. Pancreas: No focal lesion. Normal pancreatic contour. No surrounding inflammatory changes. No main pancreatic ductal dilatation. Spleen: Normal in size without focal abnormality. Adrenals/Urinary Tract: No adrenal nodule bilaterally. Status post right nephrectomy. Interval placement of a left ureteral stent with proximal pigtail terminating within a an inferior pole calyx and distal pigtail terminating within the urinary bladder lumen. Interval development of Peri-ureteral fat stranding and interval worsening of mild hydroureteronephrosis on the left. Several nephrolithiasis (2:2:24, 27,30). Couple of 2-4 mm calcifications along the ureteral stent in the mid ureter (2:50). Previously noted proximal ureteral stones are no longer visualized. Couple of layering punctate calcifications within the lumen of the urinary bladder wall (2:74). Hypodensity within the inferior pole left kidney likely represents a simple renal cyst. The urinary bladder is otherwise unremarkable. Stomach/Bowel: Stomach is within normal limits. No evidence of bowel wall thickening or dilatation. The appendix not definitely identified. Vascular/Lymphatic: No abdominal aorta or iliac aneurysm. Severe atherosclerotic plaque of the aorta and its branches. Scattered descending and sigmoid diverticulosis. No abdominal, pelvic, or inguinal lymphadenopathy. Reproductive: Penile pump implant with reservoir within the anterior lower abdomen. No inflammatory changes surrounding the reservoir noted on this noncontrast study. Other: No intraperitoneal free fluid. No intraperitoneal free gas. No organized fluid collection. Musculoskeletal: No abdominal wall hernia or abnormality. No suspicious lytic or blastic osseous lesions. No acute displaced  fracture. CT LUMBAR SPINE: Segmentation: 5 non-rib-bearing lumbar vertebral bodies. Alignment: Normal. Vertebral body/inter vertebral spaces: Multilevel moderate severe degenerative change of the spine with moderate severe intervertebral disc space narrowing. No suspicious lytic  or blastic osseous lesions. Redemonstration of a well-defined sclerotic lesion within the L3 vertebral body that likely represents a bone island. No severe osseous neural foraminal or central canal stenosis. Soft tissues: Unremarkable. IMPRESSION: 1. Interval placement of a left ureteral stent in grossly appropriate position. Couple associated 2 and 12mm ureterolithiasis within the mid left ureter. Interval slight worsening of mild left hydroureteronephrosis. Associated peri-ureteral fat stranding may be reactive versus could represent an infection with limited evaluation on this noncontrast study. Correlate with urinalysis for infection. 2. Status post right nephrectomy. 3. Colonic diverticulosis with no acute diverticulitis. 4. No acute displaced fracture or traumatic listhesis of the lumbar spine in a patient with multilevel severe degenerative changes. Electronically Signed   By: Iven Finn M.D.   On: 06/23/2020 15:56    Procedures Procedures (including critical care time)  Medications Ordered in ED Medications - No data to display  ED Course  I have reviewed the triage vital signs and the nursing notes.  Pertinent labs & imaging results that were available during my care of the patient were reviewed by me and considered in my medical decision making (see chart for details).    MDM Rules/Calculators/A&P                          MDM:  Dr. Sabra Heck in to see and examine.  Rocephin ordered,  Hospitalist consulted to admit  Final Clinical Impression(s) / ED Diagnoses Final diagnoses:  Pain  Left flank pain  Gross hematuria  Urinary tract infection with hematuria, site unspecified  Weakness    Rx / DC Orders ED  Discharge Orders    None       Sidney Ace 06/23/20 1710    Sidney Ace 06/23/20 1727    Noemi Chapel, MD 06/25/20 [phone]

## 2020-06-23 NOTE — ED Provider Notes (Signed)
Pt with hx of ALS - had RCC years ago s/p nephrectomy Had a fall with some back pain after the fall but now with increased hematuria, (known ureteral stent placed around 3 weeks ago for renal stones).  Pt had no fever at this time - has been passing blood and has had some decreased flow.  No abd ttp, no spinal ttp, no edema, able to lift both legs  CT pending - labs - r/u UTI / KS / hydro - pain may  Be related to trauma yetserday - r/o frx of vertebrae.  Medical screening examination/treatment/procedure(s) were conducted as a shared visit with non-physician practitioner(s) and myself.  I personally evaluated the patient during the encounter.  Clinical Impression:   Final diagnoses:  Pain  Left flank pain  Gross hematuria  Urinary tract infection with hematuria, site unspecified  Weakness         Noemi Chapel, MD 06/25/20 0031

## 2020-06-23 NOTE — H&P (Signed)
TRH H&P    Patient Demographics:    Jerome Mays, is a 80 y.o. male  MRN: [id number]  DOB - [birth year]  Admit Date - 06/23/2020  Referring MD/NP/PA: Marcene Brawn  Outpatient Primary MD for the patient is [name], MD  Patient coming from: Home  Chief complaint- Flank pain   HPI:    Jerome Mays  is a 80 y.o. 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  Patient reports that the pain started last night.  It was gradual in onset and, constant but did wax and wane.  He reports associated urinary frequency, hematuria as well.  Patient denies any fever.  He does report that he fell down yesterday.  He describes it as a mechanical fall where his leg went out forwards and he fell backwards onto his left hip.  He reports stinging and burning pain.  He did not hit his head.  He had no preceding symptoms such as chest pain, palpitations, shortness of breath, dizziness.  He has not taken anything for pain at home even though he is prescribed oxycodone.  Patient presented to the ED today because the left flank pain is "almost too much to stand."  Patient was recently in the hospital when [place] stent was placed.  Summary of by Dr. Milford Cage - "Patient is a 80 year old white male with history of solitary left kidney. He was seen in the emergency room in [facility] on 05/29/2020 with flank pain and elevated serum creatinine. He was also noted to have evidence of pulmonary embolus during his evaluation in the emergency room at that time. He was subsequent transferred down to [facility] emergency room on 05/30/2020 and underwent urgent placement of left JJ stent. Creatinine level normalized and  he was discharged home with outpatient follow-up scheduled here on 06/17/2020. The patient was also discharged home on Eliquis for his pulmonary emboli. In the interim he has continued to have some hematuria and occasional passage of clots."  Patient did  follow-up with difficulty voiding, had a minimal postvoid residual, Foley placement was not done, cystoscopy, insertion left JJ stent was done on June 18, 2020.  In the ED Temperature 98.7, heart rate 90-98, respiratory rate 18-25, blood pressure 136/76 White blood cell count 12.2, hemoglobin 8.8 Chemistry panel is grossly normal with a BUN of 17 and a creatinine of 0.92 UA shows large hemoglobin, many bacteria, greater than 50 white blood cells Patient started on Rocephin urine culture ordered CT abdomen pelvis showed interval placement of left ureteral stent in appropriate position.  Couple of 2-4 mm ureterolithiases.  Interval worsening of mild left hydro ureteral nephrosis, periureteral fat stranding.  Status post right nephrectomy. CT lumbar spine showed no acute displaced fracture or traumatic listhesis of lumbar spine and patient with severe multilevel degenerative changes Admission requested for treatment of UTI, evaluation by  urology in the a.m.    Review of systems:    In addition to the HPI above,  No Fever-chills, No Headache, No changes with Vision or hearing, No problems swallowing food or Liquids, No Chest pain, Cough or Shortness of Breath, No Abdominal pain, No Nausea or Vomiting, bowel movements are regular, No Blood in stool, positive for hematuria No dysuria, positive for urinary frequency No new skin rashes or bruises, No new joints pains-aches,  No new weakness, tingling, numbness in any extremity, No recent weight gain or loss, No  polydypsia or polyphagia, No significant Mental Stressors.  All other systems reviewed and are negative.    Past History of the following :    Past Medical History:   Diagnosis Date  . ALS (amyotrophic lateral sclerosis) ([place]) 2021  . Anemia    low iron  . Arthritis   . Atypical chest pain 01/04/2020  . Colonic polyp   . Constipation   . COPD (chronic obstructive pulmonary disease) ([place])   . Coronary artery calcification seen on CAT scan 01/04/2020  . GERD (gastroesophageal reflux disease)   . Gout   . History of kidney stones   . Malignant tumor of kidney ([place])   . Muscle atrophy   . Pain    LOWER BACK AND LEFT HIP - HX OF PREVIOUS LUMBAR AND CERVICAL SURGERY--PT STATES HE HAS HAD NUMBNESS IN BOTH FEET SINCE HIS BACK SURGERY  . Pure hypercholesterolemia 01/04/2020  . Renal calculus   . Retinal detachment   . Shortness of breath 01/04/2020      Past Surgical History:  Procedure Laterality Date  . BACK SURGERY     LUMBAR SURGERY  . CATARACT EXTRACTION Bilateral 2003   Surgeon unknown  . CYSTOSCOPY W/ URETERAL STENT PLACEMENT Left 05/30/2020   Procedure: CYSTOSCOPY URETERAL STENT PLACEMENT;  Surgeon: Remi Haggard, MD;  Location: [facility];  Service: Urology;  Laterality: Left;  . CYSTOSCOPY/URETEROSCOPY/HOLMIUM LASER/STENT PLACEMENT Left 06/18/2020   Procedure: CYSTOSCOPY/URETEROSCOPY/HOLMIUM LASER/STENT EXCHANGE;  Surgeon: Remi Haggard, MD;  Location: [facility];  Service: Urology;  Laterality: Left;  1 HR  . EYE SURGERY     BILATERAL CATARACT EXTRACTIONS  . GIVENS CAPSULE STUDY N/A 10/07/2015   Procedure: GIVENS CAPSULE STUDY;  Surgeon: Carol Ada, MD;  Location: [place];  Service: Endoscopy;  Laterality: N/A;  . HAND SURGERY     left  . KNEE SURGERY     right  . LAMINECTOMY     cervical  . LASER PHOTO ABLATION Left 08/19/2017   Procedure: LASER PHOTO ABLATION;  Surgeon: [name], MD;  Location: [place];  Service: Ophthalmology;  Laterality: Left;  . MEMBRANE PEEL Left 03/22/2017   Procedure: POSSIBLE MEMBRANE PEEL WITH SILICONE OIL AND PERFLURON;  Surgeon: [name], MD;  Location: [place];  Service: Ophthalmology;   Laterality: Left;  . NEPHRECTOMY     PT STATES RIGHT KIDNEY WAS REMOVED  . PAROTIDECTOMY Right 06/13/2018   Procedure: RIGHT TOTAL PAROTIDECTOMY;  Surgeon: Leta Baptist, MD;  Location: [place];  Service: ENT;  Laterality: Right;  . PARS PLANA VITRECTOMY Left 03/22/2017   Procedure: PARS PLANA VITRECTOMY WITH 25 GAUGE;  Surgeon: [name], MD;  Location: [place];  Service: Ophthalmology;  Laterality: Left;  . PARS PLANA VITRECTOMY W/ SCLERAL BUCKLE Left 08/19/2017   Procedure: 35 GAUGE PARS PLANA VITRECTOMY WITH SILCONE OIL REMOVAL;  Surgeon: [name], MD;  Location: [place];  Service: Ophthalmology;  Laterality: Left;  . 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 SALIVARY STONE REMOVAL Right 02/21/2019   Procedure: RIGHT PAROTID FISTULA REPAIR;  Surgeon: Leta Baptist, MD;  Location: [place];  Service: ENT;  Laterality: Right;  . SHOULDER SURGERY  2020   [facility]  . TOTAL KNEE ARTHROPLASTY Left 10/19/2012   Procedure: LEFT TOTAL KNEE ARTHROPLASTY;  Surgeon: Magnus Sinning, MD;  [facility];  Service: Orthopedics;  Laterality: Left;  [name] VITRECTOMY 25 GAUGE WITH SCLERAL BUCKLE Left 02/17/2017   Procedure: VITRECTOMY 25 GAUGE WITH SCLERAL BUCKLE membrane peel, injection of silicone oil, and endolaser photocoagulation.;  Surgeon: [name], MD;  Location: [place];  Service: Ophthalmology;  Laterality: Left;      Social History:      Social History   Tobacco Use  . Smoking status: Former Smoker    Packs/day: 2.00    Years: 40.00    Pack years: 80.00    Types: Cigarettes, Pipe    Quit date: 06/09/1995    Years since quitting: 25.0  . Smokeless tobacco: Never Used  Substance Use Topics  . Alcohol use: No       Family History :     Family History  Problem Relation Age of Onset  . Diabetes Mother   . COPD Brother   . COPD Sister   . Heart attack Father   . Amblyopia Neg Hx   . Blindness Neg Hx   . Cataracts Neg Hx   . Glaucoma Neg Hx   . Macular  degeneration Neg Hx   . Retinal detachment Neg Hx   . Retinitis pigmentosa Neg Hx       Home Medications:   Prior to Admission medications   Medication Sig Start Date End Date Taking? Authorizing Provider  allopurinol (ZYLOPRIM) 300 MG tablet Take 300 mg by mouth daily.    [provider]  apixaban (ELIQUIS) 5 MG TABS tablet Take 1 tablet (5 mg total) by mouth 2 (two) times daily. 06/20/20   Lauraine Rinne, NP  b complex vitamins tablet Take 1 tablet by mouth daily.    [provider]  cetirizine (ZYRTEC) 10 MG tablet Take 10 mg by mouth daily.    [provider]  Cholecalciferol (VITAMIN D3) 50 MCG (2000 UT) TABS Take by mouth.    [provider]  clobetasol (TEMOVATE) 0.05 % external solution Apply 1 application topically daily as needed. 06/11/20   [provider]  ketoconazole (NIZORAL) 2 % shampoo Apply 1 application topically every other day. 06/12/20   [provider]  ketorolac (ACULAR) 0.5 % ophthalmic solution Place 1 drop into the left eye 4 (four) times daily. Patient taking differently: Place 1 drop into the left eye in the morning and at bedtime. 08/29/19 08/28/20  [name], MD  Multiple Vitamin (MULTIVITAMIN WITH MINERALS) TABS tablet Take 1 tablet by mouth daily.    [provider]  mupirocin ointment (BACTROBAN) 2 % Apply 1 application topically daily.    [provider]  NON FORMULARY     [provider]  oxyCODONE-acetaminophen (PERCOCET) 5-325 MG tablet Take 1 tablet by mouth every 6 (six) hours as needed for severe pain. 06/18/20 06/18/21  Remi Haggard, MD  pramoxine (CERAVE ITCH RELIEF) 1 % LOTN Apply 1 application topically 2 (two) times daily as needed (itching/dry skin.).    [provider]  prednisoLONE acetate (PRED FORTE) 1 % ophthalmic suspension Place 1 drop into the left eye 4 (four) times daily. Patient  taking differently: Place 1 drop into the left eye in the morning and  at bedtime. 04/05/20   [name], MD  riluzole (RILUTEK) 50 MG tablet Take 1 tablet (50 mg total) by mouth every 12 (twelve) hours. 07/06/19   Marcial Pacas, MD  sodium chloride (MURO 128) 5 % ophthalmic solution Place 1 drop into the left eye in the morning, at noon, in the evening, and at bedtime. Patient taking differently: Place 1 drop into the left eye in the morning and at bedtime. 03/10/20   [name], MD  [place] 200-62.5-25 MCG/INH AEPB INHALE 1 PUFF ONCE DAILY Patient taking differently: Inhale 1 puff into the lungs daily. 06/11/20   Mannam, Hart Robinsons, MD  triamcinolone (KENALOG) 0.1 % Apply 1 application topically 2 (two) times daily as needed (skin irritation.). 06/12/20   [provider]     Allergies:     Allergies  Allergen Reactions  . Lipitor [Atorvastatin] Rash and Other (See Comments)    Passed out     Physical Exam:   Vitals  Blood pressure 136/76, pulse 98, temperature 98.7 F (37.1 C), temperature source Oral, resp. rate 18, height 6\' 1"  (1.854 m), weight 80.7 kg, SpO2 (!) 88 %.  1.  General: Patient lying supine in bed in no acute distress  2. Psychiatric: Mood and behavior normal for situation, patient alert and oriented x3, cooperative with exam  3. Neurologic: Chronic weakness in the upper extremities bilaterally, speech and language normal, left eyelid droop its chronic, moves all 4 extremities voluntarily, at baseline-no acute deficit on limited exam  4. HEENMT:  Head is atraumatic, normocephalic, pupils reactive to light, neck is supple, trachea is midline, mucous membranes are moist  5. Respiratory : Lungs are clear to auscultation without wheeze, rhonchi, crackles, no cyanosis  6. Cardiovascular : Heart rate is normal, rhythm is regular, no murmurs rubs or gallops  7. Gastrointestinal:  Abdomen is soft, nondistended, nontender to palpation  8. Skin:  No acute lesions on limited skin exam  9.Musculoskeletal:  2+ pitting  edema in the lower extremities bilaterally, no acute deformities    Data Review:    CBC Recent Labs  Lab 06/18/20 1036 06/23/20 1520  WBC 7.4 12.2*  HGB 9.0* 8.8*  HCT 30.5* 29.9*  PLT 208 221  MCV 91.6 91.7  MCH 27.0 27.0  MCHC 29.5* 29.4*  RDW 17.9* 17.4*  LYMPHSABS  --  0.5*  MONOABS  --  0.8  EOSABS  --  0.0  BASOSABS  --  0.0   ------------------------------------------------------------------------------------------------------------------  Results for orders placed or performed during the hospital encounter of 06/23/20 (from the past 48 hour(s))  CBC with Differential/Platelet     Status: Abnormal   Collection Time: 06/23/20  3:20 PM  Result Value Ref Range   WBC 12.2 (H) 4.0 - 10.5 K/uL   RBC 3.26 (L) 4.22 - 5.81 MIL/uL   Hemoglobin 8.8 (L) 13.0 - 17.0 g/dL   HCT 29.9 (L) 39.0 - 52.0 %   MCV 91.7 80.0 - 100.0 fL   MCH 27.0 26.0 - 34.0 pg   MCHC 29.4 (L) 30.0 - 36.0 g/dL   RDW 17.4 (H) 11.5 - 15.5 %   Platelets 221 150 - 400 K/uL   nRBC 0.0 0.0 - 0.2 %   Neutrophils Relative % 89 %   Neutro Abs 10.8 (H) 1.7 - 7.7 K/uL   Lymphocytes Relative 4 %   Lymphs Abs 0.5 (L) 0.7 - 4.0 K/uL   Monocytes  Relative 6 %   Monocytes Absolute 0.8 0.1 - 1.0 K/uL   Eosinophils Relative 0 %   Eosinophils Absolute 0.0 0.0 - 0.5 K/uL   Basophils Relative 0 %   Basophils Absolute 0.0 0.0 - 0.1 K/uL   Immature Granulocytes 1 %   Abs Immature Granulocytes 0.06 0.00 - 0.07 K/uL    Comment: Performed at [facility], [address], [place], [place] [address]  Comprehensive metabolic panel     Status: Abnormal   Collection Time: 06/23/20  3:20 PM  Result Value Ref Range   Sodium 136 135 - 145 mmol/L   Potassium 4.1 3.5 - 5.1 mmol/L   Chloride 103 98 - 111 mmol/L   CO2 24 22 - 32 mmol/L   Glucose, Bld 136 (H) 70 - 99 mg/dL    Comment: Glucose reference range applies only to samples taken after fasting for at least 8 hours.   BUN 17 8 - 23 mg/dL   Creatinine, Ser 0.92 0.61 -  1.24 mg/dL   Calcium 9.4 8.9 - 10.3 mg/dL   Total Protein 6.3 (L) 6.5 - 8.1 g/dL   Albumin 3.3 (L) 3.5 - 5.0 g/dL   AST 22 15 - 41 U/L   ALT 16 0 - 44 U/L   Alkaline Phosphatase 67 38 - 126 U/L   Total Bilirubin 0.8 0.3 - 1.2 mg/dL   GFR, Estimated >60 >60 mL/min    Comment: (NOTE) Calculated using the CKD-EPI Creatinine Equation (2021)    Anion gap 9 5 - 15    Comment: Performed at [facility], [address] [place], [place] [address]  Urinalysis, Routine w reflex microscopic Urine, Clean Catch     Status: Abnormal   Collection Time: 06/23/20  3:51 PM  Result Value Ref Range   Color, Urine RED (A) YELLOW   APPearance CLOUDY (A) CLEAR   Specific Gravity, Urine 1.013 1.005 - 1.030   pH 7.0 5.0 - 8.0   Glucose, UA NEGATIVE NEGATIVE mg/dL   Hgb urine dipstick LARGE (A) NEGATIVE   Bilirubin Urine NEGATIVE NEGATIVE   Ketones, ur NEGATIVE NEGATIVE mg/dL   Protein, ur 100 (A) NEGATIVE mg/dL   Nitrite NEGATIVE NEGATIVE   Leukocytes,Ua MODERATE (A) NEGATIVE   RBC / HPF >50 (H) 0 - 5 RBC/hpf   WBC, UA >50 (H) 0 - 5 WBC/hpf   Bacteria, UA MANY (A) NONE SEEN   Squamous Epithelial / LPF 0-5 0 - 5    Comment: Performed at [facility], [address] [place], [place] [address]  Resp Panel by RT-PCR (Flu A&B, Covid) Nasopharyngeal Swab     Status: None   Collection Time: 06/23/20  5:01 PM   Specimen: Nasopharyngeal Swab; Nasopharyngeal(NP) swabs in vial transport medium  Result Value Ref Range   SARS Coronavirus 2 by RT PCR NEGATIVE NEGATIVE    Comment: (NOTE) SARS-CoV-2 target nucleic acids are NOT DETECTED.  The SARS-CoV-2 RNA is generally detectable in upper respiratory specimens during the acute phase of infection. The lowest concentration of SARS-CoV-2 viral copies this assay can detect is 138 copies/mL. A negative result does not preclude SARS-Cov-2 infection and should not be used as the sole basis for treatment or other patient management decisions. A negative result may  occur with  improper specimen collection/handling, submission of specimen other than nasopharyngeal swab, presence of viral mutation(s) within the areas targeted by this assay, and inadequate number of viral copies(<138 copies/mL). A negative result must be combined with clinical observations, patient  history, and epidemiological information. The expected result is Negative.  Fact Sheet for Patients:  EntrepreneurPulse.com.au  Fact Sheet for Healthcare Providers:  IncredibleEmployment.be  This test is no t yet approved or cleared by the [place] FDA and  has been authorized for detection and/or diagnosis of SARS-CoV-2 by FDA under an Emergency Use Authorization (EUA). This EUA will remain  in effect (meaning this test can be used) for the duration of the COVID-19 declaration under Section 564(b)(1) of the Act, 21 U.S.C.section 360bbb-3(b)(1), unless the authorization is terminated  or revoked sooner.       Influenza A by PCR NEGATIVE NEGATIVE   Influenza B by PCR NEGATIVE NEGATIVE    Comment: (NOTE) The Xpert Xpress SARS-CoV-2/FLU/RSV plus assay is intended as an aid in the diagnosis of influenza from Nasopharyngeal swab specimens and should not be used as a sole basis for treatment. Nasal washings and aspirates are unacceptable for Xpert Xpress SARS-CoV-2/FLU/RSV testing.  Fact Sheet for Patients: EntrepreneurPulse.com.au  Fact Sheet for Healthcare Providers: IncredibleEmployment.be  This test is not yet approved or cleared by the [place] FDA and has been authorized for detection and/or diagnosis of SARS-CoV-2 by FDA under an Emergency Use Authorization (EUA). This EUA will remain in effect (meaning this test can be used) for the duration of the COVID-19 declaration under Section 564(b)(1) of the Act, 21 U.S.C. section 360bbb-3(b)(1), unless the authorization is terminated  or revoked.  Performed at [facility], [address] [place], [place] [address]     Chemistries  Recent Labs  Lab 06/18/20 1036 06/23/20 1520  NA 139 136  K 3.8 4.1  CL 107 103  CO2 26 24  GLUCOSE 113* 136*  BUN 16 17  CREATININE 0.91 0.92  CALCIUM 9.2 9.4  AST  --  22  ALT  --  16  ALKPHOS  --  67  BILITOT  --  0.8   ------------------------------------------------------------------------------------------------------------------  ------------------------------------------------------------------------------------------------------------------ GFR: Estimated Creatinine Clearance: 71.2 mL/min (by C-G formula based on SCr of 0.92 mg/dL). Liver Function Tests: Recent Labs  Lab 06/23/20 1520  AST 22  ALT 16  ALKPHOS 67  BILITOT 0.8  PROT 6.3*  ALBUMIN 3.3*   No results for input(s): LIPASE, AMYLASE in the last 168 hours. No results for input(s): AMMONIA in the last 168 hours. Coagulation Profile: No results for input(s): INR, PROTIME in the last 168 hours. Cardiac Enzymes: No results for input(s): CKTOTAL, CKMB, CKMBINDEX, TROPONINI in the last 168 hours. BNP (last 3 results) No results for input(s): PROBNP in the last 8760 hours. HbA1C: No results for input(s): HGBA1C in the last 72 hours. CBG: No results for input(s): GLUCAP in the last 168 hours. Lipid Profile: No results for input(s): CHOL, HDL, LDLCALC, TRIG, CHOLHDL, LDLDIRECT in the last 72 hours. Thyroid Function Tests: No results for input(s): TSH, T4TOTAL, FREET4, T3FREE, THYROIDAB in the last 72 hours. Anemia Panel: No results for input(s): VITAMINB12, FOLATE, FERRITIN, TIBC, IRON, RETICCTPCT in the last 72 hours.  --------------------------------------------------------------------------------------------------------------- Urine analysis:    Component Value Date/Time   COLORURINE RED (A) 06/23/2020 1551   APPEARANCEUR CLOUDY (A) 06/23/2020 1551   LABSPEC 1.013 06/23/2020 1551   PHURINE  7.0 06/23/2020 1551   GLUCOSEU NEGATIVE 06/23/2020 1551   HGBUR LARGE (A) 06/23/2020 1551   BILIRUBINUR NEGATIVE 06/23/2020 [place] 06/23/2020 1551   PROTEINUR 100 (A) 06/23/2020 1551   NITRITE NEGATIVE 06/23/2020 1551   LEUKOCYTESUR MODERATE (A) 06/23/2020 1551      Imaging  Results:    CT ABDOMEN PELVIS WO CONTRAST  Result Date: 06/23/2020 CLINICAL DATA:  Hematuria, unknown cause. Left-sided abdominal pain. Nausea and constipation. EXAM: CT ABDOMEN AND PELVIS WITHOUT CONTRAST CT LUMBAR SPINE without contrast TECHNIQUE: Multidetector CT imaging of the abdomen and pelvis was performed following the standard protocol without IV contrast. Multiplanar CT reconstructions of the lumbar spine. COMPARISON:  CT abdomen pelvis 05/29/2020 FINDINGS: Lower chest: Coronary artery calcifications. Bilateral lower lobe atelectasis again noted. Calcified granuloma at the right base. Hepatobiliary: No focal liver abnormality. No gallstones, gallbladder wall thickening, or pericholecystic fluid. No biliary dilatation. Pancreas: No focal lesion. Normal pancreatic contour. No surrounding inflammatory changes. No main pancreatic ductal dilatation. Spleen: Normal in size without focal abnormality. Adrenals/Urinary Tract: No adrenal nodule bilaterally. Status post right nephrectomy. Interval placement of a left ureteral stent with proximal pigtail terminating within a an inferior pole calyx and distal pigtail terminating within the urinary bladder lumen. Interval development of Peri-ureteral fat stranding and interval worsening of mild hydroureteronephrosis on the left. Several nephrolithiasis (2:2:24, 27,30). Couple of 2-4 mm calcifications along the ureteral stent in the mid ureter (2:50). Previously noted proximal ureteral stones are no longer visualized. Couple of layering punctate calcifications within the lumen of the urinary bladder wall (2:74). Hypodensity within the inferior pole left kidney likely  represents a simple renal cyst. The urinary bladder is otherwise unremarkable. Stomach/Bowel: Stomach is within normal limits. No evidence of bowel wall thickening or dilatation. The appendix not definitely identified. Vascular/Lymphatic: No abdominal aorta or iliac aneurysm. Severe atherosclerotic plaque of the aorta and its branches. Scattered descending and sigmoid diverticulosis. No abdominal, pelvic, or inguinal lymphadenopathy. Reproductive: Penile pump implant with reservoir within the anterior lower abdomen. No inflammatory changes surrounding the reservoir noted on this noncontrast study. Other: No intraperitoneal free fluid. No intraperitoneal free gas. No organized fluid collection. Musculoskeletal: No abdominal wall hernia or abnormality. No suspicious lytic or blastic osseous lesions. No acute displaced fracture. CT LUMBAR SPINE: Segmentation: 5 non-rib-bearing lumbar vertebral bodies. Alignment: Normal. Vertebral body/inter vertebral spaces: Multilevel moderate severe degenerative change of the spine with moderate severe intervertebral disc space narrowing. No suspicious lytic or blastic osseous lesions. Redemonstration of a well-defined sclerotic lesion within the L3 vertebral body that likely represents a bone island. No severe osseous neural foraminal or central canal stenosis. Soft tissues: Unremarkable. IMPRESSION: 1. Interval placement of a left ureteral stent in grossly appropriate position. Couple associated 2 and 76mm ureterolithiasis within the mid left ureter. Interval slight worsening of mild left hydroureteronephrosis. Associated peri-ureteral fat stranding may be reactive versus could represent an infection with limited evaluation on this noncontrast study. Correlate with urinalysis for infection. 2. Status post right nephrectomy. 3. Colonic diverticulosis with no acute diverticulitis. 4. No acute displaced fracture or traumatic listhesis of the lumbar spine in a patient with multilevel  severe degenerative changes. Electronically Signed   By: Iven Finn M.D.   On: 06/23/2020 15:56   CT L-SPINE NO CHARGE  Result Date: 06/23/2020 CLINICAL DATA:  Hematuria, unknown cause. Left-sided abdominal pain. Nausea and constipation. EXAM: CT ABDOMEN AND PELVIS WITHOUT CONTRAST CT LUMBAR SPINE without contrast TECHNIQUE: Multidetector CT imaging of the abdomen and pelvis was performed following the standard protocol without IV contrast. Multiplanar CT reconstructions of the lumbar spine. COMPARISON:  CT abdomen pelvis 05/29/2020 FINDINGS: Lower chest: Coronary artery calcifications. Bilateral lower lobe atelectasis again noted. Calcified granuloma at the right base. Hepatobiliary: No focal liver abnormality. No gallstones, gallbladder wall thickening, or pericholecystic fluid.  No biliary dilatation. Pancreas: No focal lesion. Normal pancreatic contour. No surrounding inflammatory changes. No main pancreatic ductal dilatation. Spleen: Normal in size without focal abnormality. Adrenals/Urinary Tract: No adrenal nodule bilaterally. Status post right nephrectomy. Interval placement of a left ureteral stent with proximal pigtail terminating within a an inferior pole calyx and distal pigtail terminating within the urinary bladder lumen. Interval development of Peri-ureteral fat stranding and interval worsening of mild hydroureteronephrosis on the left. Several nephrolithiasis (2:2:24, 27,30). Couple of 2-4 mm calcifications along the ureteral stent in the mid ureter (2:50). Previously noted proximal ureteral stones are no longer visualized. Couple of layering punctate calcifications within the lumen of the urinary bladder wall (2:74). Hypodensity within the inferior pole left kidney likely represents a simple renal cyst. The urinary bladder is otherwise unremarkable. Stomach/Bowel: Stomach is within normal limits. No evidence of bowel wall thickening or dilatation. The appendix not definitely identified.  Vascular/Lymphatic: No abdominal aorta or iliac aneurysm. Severe atherosclerotic plaque of the aorta and its branches. Scattered descending and sigmoid diverticulosis. No abdominal, pelvic, or inguinal lymphadenopathy. Reproductive: Penile pump implant with reservoir within the anterior lower abdomen. No inflammatory changes surrounding the reservoir noted on this noncontrast study. Other: No intraperitoneal free fluid. No intraperitoneal free gas. No organized fluid collection. Musculoskeletal: No abdominal wall hernia or abnormality. No suspicious lytic or blastic osseous lesions. No acute displaced fracture. CT LUMBAR SPINE: Segmentation: 5 non-rib-bearing lumbar vertebral bodies. Alignment: Normal. Vertebral body/inter vertebral spaces: Multilevel moderate severe degenerative change of the spine with moderate severe intervertebral disc space narrowing. No suspicious lytic or blastic osseous lesions. Redemonstration of a well-defined sclerotic lesion within the L3 vertebral body that likely represents a bone island. No severe osseous neural foraminal or central canal stenosis. Soft tissues: Unremarkable. IMPRESSION: 1. Interval placement of a left ureteral stent in grossly appropriate position. Couple associated 2 and 4mm ureterolithiasis within the mid left ureter. Interval slight worsening of mild left hydroureteronephrosis. Associated peri-ureteral fat stranding may be reactive versus could represent an infection with limited evaluation on this noncontrast study. Correlate with urinalysis for infection. 2. Status post right nephrectomy. 3. Colonic diverticulosis with no acute diverticulitis. 4. No acute displaced fracture or traumatic listhesis of the lumbar spine in a patient with multilevel severe degenerative changes. Electronically Signed   By: Tish FredericksonMorgane  Naveau M.D.   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 UTI 1. UA indicative of UTI 2. Urine itself is oriented red consistent with hematuria noted on UA 3. Urine culture pending 4. Rocephin started 5. Continue Rocephin 6. In setting of JJ stent placed in December-consult urology 7. N.p.o. after midnight in case any urology procedure should need to be done 8. Patient has unilateral kidney, closely monitor renal function with chemistry daily 9. Continue to monitor 2. Mild protein calorie malnutrition 1. Albumin 3.3 2. Patient reports normal appetite and normal p.o. intake 3. Encourage nutrient dense food choices 4. Boost supplement between meals 5. Monitor albumin level 3. History of PE 1. Continue Eliquis 4. History of COPD 1. Continue Trelegy 2. Patient on 2 L nasal cannula at baseline continue oxygen supplementation 3. Albuterol in case patient should experience shortness of breath or wheezing 5. History of ALS 1. Continue home medication 2. Chronic weakness  in the upper extremities bilaterally 6. Likely to discharge home in 24 to 48 hours on p.o. antibiotic   DVT Prophylaxis-   Eliquis- SCDs  AM Labs Ordered, also please review Full Orders  Family Communication: Admission, patients condition and plan of care including tests being ordered have been discussed with the patient and wife who indicate understanding and agree with the plan and Code Status.  Code Status: Full  Admission status: Observation Time spent in minutes : [place]

## 2020-06-24 ENCOUNTER — Ambulatory Visit: Payer: Medicare HMO | Admitting: *Deleted

## 2020-06-24 ENCOUNTER — Ambulatory Visit: Payer: Self-pay | Admitting: *Deleted

## 2020-06-24 DIAGNOSIS — Z86711 Personal history of pulmonary embolism: Secondary | ICD-10-CM | POA: Diagnosis not present

## 2020-06-24 DIAGNOSIS — Z905 Acquired absence of kidney: Secondary | ICD-10-CM | POA: Diagnosis not present

## 2020-06-24 DIAGNOSIS — I2510 Atherosclerotic heart disease of native coronary artery without angina pectoris: Secondary | ICD-10-CM | POA: Diagnosis present

## 2020-06-24 DIAGNOSIS — Z6823 Body mass index (BMI) 23.0-23.9, adult: Secondary | ICD-10-CM | POA: Diagnosis not present

## 2020-06-24 DIAGNOSIS — E441 Mild protein-calorie malnutrition: Secondary | ICD-10-CM | POA: Diagnosis not present

## 2020-06-24 DIAGNOSIS — Z9842 Cataract extraction status, left eye: Secondary | ICD-10-CM | POA: Diagnosis not present

## 2020-06-24 DIAGNOSIS — J432 Centrilobular emphysema: Secondary | ICD-10-CM | POA: Diagnosis not present

## 2020-06-24 DIAGNOSIS — Z87891 Personal history of nicotine dependence: Secondary | ICD-10-CM | POA: Diagnosis not present

## 2020-06-24 DIAGNOSIS — Z96652 Presence of left artificial knee joint: Secondary | ICD-10-CM | POA: Diagnosis present

## 2020-06-24 DIAGNOSIS — R0902 Hypoxemia: Secondary | ICD-10-CM | POA: Diagnosis not present

## 2020-06-24 DIAGNOSIS — Z86010 Personal history of colonic polyps: Secondary | ICD-10-CM | POA: Diagnosis not present

## 2020-06-24 DIAGNOSIS — I2782 Chronic pulmonary embolism: Secondary | ICD-10-CM | POA: Diagnosis not present

## 2020-06-24 DIAGNOSIS — J449 Chronic obstructive pulmonary disease, unspecified: Secondary | ICD-10-CM | POA: Diagnosis not present

## 2020-06-24 DIAGNOSIS — E785 Hyperlipidemia, unspecified: Secondary | ICD-10-CM | POA: Diagnosis present

## 2020-06-24 DIAGNOSIS — Z8249 Family history of ischemic heart disease and other diseases of the circulatory system: Secondary | ICD-10-CM | POA: Diagnosis not present

## 2020-06-24 DIAGNOSIS — M1990 Unspecified osteoarthritis, unspecified site: Secondary | ICD-10-CM | POA: Diagnosis present

## 2020-06-24 DIAGNOSIS — M109 Gout, unspecified: Secondary | ICD-10-CM | POA: Diagnosis present

## 2020-06-24 DIAGNOSIS — N136 Pyonephrosis: Secondary | ICD-10-CM | POA: Diagnosis not present

## 2020-06-24 DIAGNOSIS — N390 Urinary tract infection, site not specified: Secondary | ICD-10-CM | POA: Diagnosis not present

## 2020-06-24 DIAGNOSIS — N10 Acute tubulo-interstitial nephritis: Secondary | ICD-10-CM | POA: Diagnosis not present

## 2020-06-24 DIAGNOSIS — Z20822 Contact with and (suspected) exposure to covid-19: Secondary | ICD-10-CM | POA: Diagnosis not present

## 2020-06-24 DIAGNOSIS — Z825 Family history of asthma and other chronic lower respiratory diseases: Secondary | ICD-10-CM | POA: Diagnosis not present

## 2020-06-24 DIAGNOSIS — R109 Unspecified abdominal pain: Secondary | ICD-10-CM | POA: Diagnosis not present

## 2020-06-24 DIAGNOSIS — W1830XA Fall on same level, unspecified, initial encounter: Secondary | ICD-10-CM | POA: Diagnosis not present

## 2020-06-24 DIAGNOSIS — Z87442 Personal history of urinary calculi: Secondary | ICD-10-CM | POA: Diagnosis not present

## 2020-06-24 DIAGNOSIS — Z9841 Cataract extraction status, right eye: Secondary | ICD-10-CM | POA: Diagnosis not present

## 2020-06-24 DIAGNOSIS — K219 Gastro-esophageal reflux disease without esophagitis: Secondary | ICD-10-CM | POA: Diagnosis present

## 2020-06-24 DIAGNOSIS — Z85528 Personal history of other malignant neoplasm of kidney: Secondary | ICD-10-CM | POA: Diagnosis not present

## 2020-06-24 DIAGNOSIS — R310 Gross hematuria: Secondary | ICD-10-CM | POA: Diagnosis not present

## 2020-06-24 DIAGNOSIS — R319 Hematuria, unspecified: Secondary | ICD-10-CM | POA: Diagnosis not present

## 2020-06-24 DIAGNOSIS — G1221 Amyotrophic lateral sclerosis: Secondary | ICD-10-CM | POA: Diagnosis not present

## 2020-06-24 DIAGNOSIS — Z888 Allergy status to other drugs, medicaments and biological substances status: Secondary | ICD-10-CM | POA: Diagnosis not present

## 2020-06-24 DIAGNOSIS — R1032 Left lower quadrant pain: Secondary | ICD-10-CM | POA: Diagnosis not present

## 2020-06-24 LAB — CBC WITH DIFFERENTIAL/PLATELET
Abs Immature Granulocytes: 0.03 10*3/uL (ref 0.00–0.07)
Basophils Absolute: 0 10*3/uL (ref 0.0–0.1)
Basophils Relative: 0 %
Eosinophils Absolute: 0.2 10*3/uL (ref 0.0–0.5)
Eosinophils Relative: 2 %
HCT: 27.1 % — ABNORMAL LOW (ref 39.0–52.0)
Hemoglobin: 8 g/dL — ABNORMAL LOW (ref 13.0–17.0)
Immature Granulocytes: 0 %
Lymphocytes Relative: 15 %
Lymphs Abs: 1.1 10*3/uL (ref 0.7–4.0)
MCH: 26.6 pg (ref 26.0–34.0)
MCHC: 29.5 g/dL — ABNORMAL LOW (ref 30.0–36.0)
MCV: 90 fL (ref 80.0–100.0)
Monocytes Absolute: 0.6 10*3/uL (ref 0.1–1.0)
Monocytes Relative: 8 %
Neutro Abs: 5.3 10*3/uL (ref 1.7–7.7)
Neutrophils Relative %: 75 %
Platelets: 217 10*3/uL (ref 150–400)
RBC: 3.01 MIL/uL — ABNORMAL LOW (ref 4.22–5.81)
RDW: 17.6 % — ABNORMAL HIGH (ref 11.5–15.5)
WBC: 7.2 10*3/uL (ref 4.0–10.5)
nRBC: 0 % (ref 0.0–0.2)

## 2020-06-24 LAB — COMPREHENSIVE METABOLIC PANEL
ALT: 12 U/L (ref 0–44)
AST: 21 U/L (ref 15–41)
Albumin: 2.8 g/dL — ABNORMAL LOW (ref 3.5–5.0)
Alkaline Phosphatase: 59 U/L (ref 38–126)
Anion gap: 4 — ABNORMAL LOW (ref 5–15)
BUN: 15 mg/dL (ref 8–23)
CO2: 27 mmol/L (ref 22–32)
Calcium: 9 mg/dL (ref 8.9–10.3)
Chloride: 109 mmol/L (ref 98–111)
Creatinine, Ser: 0.77 mg/dL (ref 0.61–1.24)
GFR, Estimated: >60 mL/min (ref >60)
Glucose, Bld: 110 mg/dL — ABNORMAL HIGH (ref 70–99)
Potassium: 4.1 mmol/L (ref 3.5–5.1)
Sodium: 140 mmol/L (ref 135–145)
Total Bilirubin: 0.5 mg/dL (ref 0.3–1.2)
Total Protein: 5.8 g/dL — ABNORMAL LOW (ref 6.5–8.1)

## 2020-06-24 LAB — MAGNESIUM: Magnesium: 2.3 mg/dL (ref 1.7–2.4)

## 2020-06-24 NOTE — Progress Notes (Signed)
PROGRESS NOTE    [name]  [id number] DOB: 1940/09/09 DOA: 06/23/2020 PCP: [name], MD   Brief Narrative:  Jerome Mays  is a 80 y.o. 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  CT of the abdomen and pelvis demonstrated some mild ureterolithiasis with some mild left hydronephrosis noted.  He was admitted with UTI/pyelonephritis and empirically started on Rocephin.  Case discussed with urology with no need for intervention at this time.  Continue empiric treatment and follow urine culture.  Assessment & Plan:   Principal Problem:   UTI (urinary tract infection) Active Problems:   COPD, group B, by GOLD 2017 classification ([place])   Pulmonary emboli (HCC)   Mild protein-calorie malnutrition (HCC)   UTI/left-sided pyelonephritis -In setting of recent JJ stent placement and revision -Continue empiric Rocephin -Follow urine culture -Mild hydronephrosis and nephrolithiasis noted on the outside, but stable creatinine.  Discussed with urology with no need for intervention at this time.  History of PE -Hold home Eliquis for now given hematuria -Monitor for worsening hematuria -Follow CBC, if decreasing trend plan to hold temporarily  Mild protein calorie malnutrition -Albumin 3.3 -Boost supplement  History of COPD -Continue Trelegy -Chronic hypoxemia and wears 2 L nasal cannula  History of ALS -Continue home medications -Has chronic weakness to the bilateral upper extremities  DVT prophylaxis: Eliquis to SCDs Code Status: Full Family Communication: Discussed with wife, [name] at bedside Disposition Plan:  Status is: Observation  The patient will require care spanning > 2 midnights and should be moved to inpatient because: IV treatments  appropriate due to intensity of illness or inability to take PO and Inpatient level of care appropriate due to severity of illness  Dispo: The patient is from: Home              Anticipated d/c is to: Home              Anticipated d/c date is: 2 days              Patient currently is not medically stable to d/c.  Patient has a complicated UTI with recent procedures that requires IV antibiotics and follow-up urine culture prior to discharge home.   Nutritional Assessment:  The patient's BMI is: Body mass index is 23.48 kg/m.[name]  Consultants:   Spoke with Urology Dr. Alyson Ingles on 1/17  Procedures:   See below  Antimicrobials:  Anti-infectives (From admission, onward)   Start     Dose/Rate Route Frequency Ordered Stop   06/24/20 1700  cefTRIAXone (ROCEPHIN) 1 g in sodium chloride 0.9 % 100 mL IVPB        1 g 200 mL/hr over 30 Minutes Intravenous Every 24 hours 06/23/20 1817     06/23/20 1715  cefTRIAXone (ROCEPHIN) 1 g in sodium chloride 0.9 % 100 mL IVPB        1 g 200 mL/hr over 30 Minutes Intravenous  Once 06/23/20 1708 06/23/20 1816       Subjective: Patient seen and evaluated today with no new acute complaints or concerns. No acute concerns or events noted overnight.  He denies any further flank pain, and has had good urine output with minimal hematuria.  Objective: Vitals:   06/24/20 0830 06/24/20 0900 06/24/20 0915 06/24/20 0930  BP: 126/73 122/74  109/68  Pulse:  71    Resp:  14 13 14   Temp:      TempSrc:      SpO2:  96%    Weight:      Height:        Intake/Output Summary (Last 24 hours) at 06/24/2020 1010 Last data filed at 06/24/2020 0355 Gross per 24 hour  Intake 100 ml  Output 1050 ml  Net -950 ml   Filed Weights   06/23/20 1430  Weight: 80.7 kg    Examination:  General exam: Appears calm and comfortable  Respiratory system: Clear to auscultation. Respiratory effort normal.  Chronic nasal cannula oxygen, 2 L Cardiovascular system: S1 & S2 heard,  RRR.  Gastrointestinal system: Abdomen is soft Central nervous system: Alert and awake Extremities: No edema Skin: No significant lesions noted Psychiatry: Flat affect.    Data Reviewed: I have personally reviewed following labs and imaging studies  CBC: Recent Labs  Lab 06/18/20 1036 06/23/20 1520 06/24/20 0430  WBC 7.4 12.2* 7.2  NEUTROABS  --  10.8* 5.3  HGB 9.0* 8.8* 8.0*  HCT 30.5* 29.9* 27.1*  MCV 91.6 91.7 90.0  PLT 208 221 [id number]   Basic Metabolic Panel: Recent Labs  Lab 06/18/20 1036 06/23/20 1520 06/24/20 0430  NA 139 136 140  K 3.8 4.1 4.1  CL 107 103 109  CO2 26 24 27   GLUCOSE 113* 136* 110*  BUN 16 17 15   CREATININE 0.91 0.92 0.77  CALCIUM 9.2 9.4 9.0  MG  --   --  2.3   GFR: Estimated Creatinine Clearance: 81.8 mL/min (by C-G formula based on SCr of 0.77 mg/dL). Liver Function Tests: Recent Labs  Lab 06/23/20 1520 06/24/20 0430  AST 22 21  ALT 16 12  ALKPHOS 67 59  BILITOT 0.8 0.5  PROT 6.3* 5.8*  ALBUMIN 3.3* 2.8*   No results for input(s): LIPASE, AMYLASE in the last 168 hours. No results for input(s): AMMONIA in the last 168 hours. Coagulation Profile: No results for input(s): INR, PROTIME in the last 168 hours. Cardiac Enzymes: No results for input(s): CKTOTAL, CKMB, CKMBINDEX, TROPONINI in the last 168 hours. BNP (last 3 results) No results for input(s): PROBNP in the last 8760 hours. HbA1C: No results for input(s): HGBA1C in the last 72 hours. CBG: No results for input(s): GLUCAP in the last 168 hours. Lipid Profile: No results for input(s): CHOL, HDL, LDLCALC, TRIG, CHOLHDL, LDLDIRECT in the last 72 hours. Thyroid Function Tests: No results for input(s): TSH, T4TOTAL, FREET4, T3FREE, THYROIDAB in the last 72 hours. Anemia Panel: No results for input(s): VITAMINB12, FOLATE, FERRITIN, TIBC, IRON, RETICCTPCT in the last 72 hours. Sepsis Labs: No results for input(s): PROCALCITON, LATICACIDVEN in the last 168 hours.  Recent  Results (from the past 240 hour(s))  SARS CORONAVIRUS 2 (TAT 6-24 HRS) Nasopharyngeal Nasopharyngeal Swab     Status: None   Collection Time: 06/17/20 12:01 PM   Specimen: Nasopharyngeal Swab  Result Value Ref Range Status   SARS Coronavirus 2 NEGATIVE NEGATIVE Final    Comment: (NOTE) SARS-CoV-2 target nucleic acids are NOT DETECTED.  The SARS-CoV-2 RNA is generally detectable in upper and lower respiratory specimens during the acute phase of infection. Negative results do not preclude SARS-CoV-2 infection, do not rule out co-infections with other pathogens, and should not be used as the sole basis for treatment or other patient management decisions. Negative results must be combined with clinical observations, patient history, and epidemiological information. The expected result  is Negative.  Fact Sheet for Patients: SugarRoll.be  Fact Sheet for Healthcare Providers: https://www.woods-mathews.com/  This test is not yet approved or cleared by the [place] FDA and  has been authorized for detection and/or diagnosis of SARS-CoV-2 by FDA under an Emergency Use Authorization (EUA). This EUA will remain  in effect (meaning this test can be used) for the duration of the COVID-19 declaration under Se ction 564(b)(1) of the Act, 21 U.S.C. section 360bbb-3(b)(1), unless the authorization is terminated or revoked sooner.  Performed at [facility], [place] [address] [place], [place] [address]   Resp Panel by RT-PCR (Flu A&B, Covid) Nasopharyngeal Swab     Status: None   Collection Time: 06/23/20  5:01 PM   Specimen: Nasopharyngeal Swab; Nasopharyngeal(NP) swabs in vial transport medium  Result Value Ref Range Status   SARS Coronavirus 2 by RT PCR NEGATIVE NEGATIVE Final    Comment: (NOTE) SARS-CoV-2 target nucleic acids are NOT DETECTED.  The SARS-CoV-2 RNA is generally detectable in upper respiratory specimens during the acute phase  of infection. The lowest concentration of SARS-CoV-2 viral copies this assay can detect is 138 copies/mL. A negative result does not preclude SARS-Cov-2 infection and should not be used as the sole basis for treatment or other patient management decisions. A negative result may occur with  improper specimen collection/handling, submission of specimen other than nasopharyngeal swab, presence of viral mutation(s) within the areas targeted by this assay, and inadequate number of viral copies(<138 copies/mL). A negative result must be combined with clinical observations, patient history, and epidemiological information. The expected result is Negative.  Fact Sheet for Patients:  EntrepreneurPulse.com.au  Fact Sheet for Healthcare Providers:  IncredibleEmployment.be  This test is no t yet approved or cleared by the [place] FDA and  has been authorized for detection and/or diagnosis of SARS-CoV-2 by FDA under an Emergency Use Authorization (EUA). This EUA will remain  in effect (meaning this test can be used) for the duration of the COVID-19 declaration under Section 564(b)(1) of the Act, 21 U.S.C.section 360bbb-3(b)(1), unless the authorization is terminated  or revoked sooner.       Influenza A by PCR NEGATIVE NEGATIVE Final   Influenza B by PCR NEGATIVE NEGATIVE Final    Comment: (NOTE) The Xpert Xpress SARS-CoV-2/FLU/RSV plus assay is intended as an aid in the diagnosis of influenza from Nasopharyngeal swab specimens and should not be used as a sole basis for treatment. Nasal washings and aspirates are unacceptable for Xpert Xpress SARS-CoV-2/FLU/RSV testing.  Fact Sheet for Patients: EntrepreneurPulse.com.au  Fact Sheet for Healthcare Providers: IncredibleEmployment.be  This test is not yet approved or cleared by the [place] FDA and has been authorized for detection and/or diagnosis of  SARS-CoV-2 by FDA under an Emergency Use Authorization (EUA). This EUA will remain in effect (meaning this test can be used) for the duration of the COVID-19 declaration under Section 564(b)(1) of the Act, 21 U.S.C. section 360bbb-3(b)(1), unless the authorization is terminated or revoked.  Performed at [facility], [address], [place], [place] [address]          Radiology Studies: CT ABDOMEN PELVIS WO CONTRAST  Result Date: 06/23/2020 CLINICAL DATA:  Hematuria, unknown cause. Left-sided abdominal pain. Nausea and constipation. EXAM: CT ABDOMEN AND PELVIS WITHOUT CONTRAST CT LUMBAR SPINE without contrast TECHNIQUE: Multidetector CT imaging of the abdomen and pelvis was performed following the standard protocol without IV contrast. Multiplanar CT reconstructions of the lumbar spine. COMPARISON:  CT abdomen pelvis  05/29/2020 FINDINGS: Lower chest: Coronary artery calcifications. Bilateral lower lobe atelectasis again noted. Calcified granuloma at the right base. Hepatobiliary: No focal liver abnormality. No gallstones, gallbladder wall thickening, or pericholecystic fluid. No biliary dilatation. Pancreas: No focal lesion. Normal pancreatic contour. No surrounding inflammatory changes. No main pancreatic ductal dilatation. Spleen: Normal in size without focal abnormality. Adrenals/Urinary Tract: No adrenal nodule bilaterally. Status post right nephrectomy. Interval placement of a left ureteral stent with proximal pigtail terminating within a an inferior pole calyx and distal pigtail terminating within the urinary bladder lumen. Interval development of Peri-ureteral fat stranding and interval worsening of mild hydroureteronephrosis on the left. Several nephrolithiasis (2:2:24, 27,30). Couple of 2-4 mm calcifications along the ureteral stent in the mid ureter (2:50). Previously noted proximal ureteral stones are no longer visualized. Couple of layering punctate calcifications within the lumen of the  urinary bladder wall (2:74). Hypodensity within the inferior pole left kidney likely represents a simple renal cyst. The urinary bladder is otherwise unremarkable. Stomach/Bowel: Stomach is within normal limits. No evidence of bowel wall thickening or dilatation. The appendix not definitely identified. Vascular/Lymphatic: No abdominal aorta or iliac aneurysm. Severe atherosclerotic plaque of the aorta and its branches. Scattered descending and sigmoid diverticulosis. No abdominal, pelvic, or inguinal lymphadenopathy. Reproductive: Penile pump implant with reservoir within the anterior lower abdomen. No inflammatory changes surrounding the reservoir noted on this noncontrast study. Other: No intraperitoneal free fluid. No intraperitoneal free gas. No organized fluid collection. Musculoskeletal: No abdominal wall hernia or abnormality. No suspicious lytic or blastic osseous lesions. No acute displaced fracture. CT LUMBAR SPINE: Segmentation: 5 non-rib-bearing lumbar vertebral bodies. Alignment: Normal. Vertebral body/inter vertebral spaces: Multilevel moderate severe degenerative change of the spine with moderate severe intervertebral disc space narrowing. No suspicious lytic or blastic osseous lesions. Redemonstration of a well-defined sclerotic lesion within the L3 vertebral body that likely represents a bone island. No severe osseous neural foraminal or central canal stenosis. Soft tissues: Unremarkable. IMPRESSION: 1. Interval placement of a left ureteral stent in grossly appropriate position. Couple associated 2 and 25mm ureterolithiasis within the mid left ureter. Interval slight worsening of mild left hydroureteronephrosis. Associated peri-ureteral fat stranding may be reactive versus could represent an infection with limited evaluation on this noncontrast study. Correlate with urinalysis for infection. 2. Status post right nephrectomy. 3. Colonic diverticulosis with no acute diverticulitis. 4. No acute  displaced fracture or traumatic listhesis of the lumbar spine in a patient with multilevel severe degenerative changes. Electronically Signed   By: Iven Finn M.D.   On: 06/23/2020 15:56   CT L-SPINE NO CHARGE  Result Date: 06/23/2020 CLINICAL DATA:  Hematuria, unknown cause. Left-sided abdominal pain. Nausea and constipation. EXAM: CT ABDOMEN AND PELVIS WITHOUT CONTRAST CT LUMBAR SPINE without contrast TECHNIQUE: Multidetector CT imaging of the abdomen and pelvis was performed following the standard protocol without IV contrast. Multiplanar CT reconstructions of the lumbar spine. COMPARISON:  CT abdomen pelvis 05/29/2020 FINDINGS: Lower chest: Coronary artery calcifications. Bilateral lower lobe atelectasis again noted. Calcified granuloma at the right base. Hepatobiliary: No focal liver abnormality. No gallstones, gallbladder wall thickening, or pericholecystic fluid. No biliary dilatation. Pancreas: No focal lesion. Normal pancreatic contour. No surrounding inflammatory changes. No main pancreatic ductal dilatation. Spleen: Normal in size without focal abnormality. Adrenals/Urinary Tract: No adrenal nodule bilaterally. Status post right nephrectomy. Interval placement of a left ureteral stent with proximal pigtail terminating within a an inferior pole calyx and distal pigtail terminating within the urinary bladder lumen. Interval development of Peri-ureteral fat  stranding and interval worsening of mild hydroureteronephrosis on the left. Several nephrolithiasis (2:2:24, 27,30). Couple of 2-4 mm calcifications along the ureteral stent in the mid ureter (2:50). Previously noted proximal ureteral stones are no longer visualized. Couple of layering punctate calcifications within the lumen of the urinary bladder wall (2:74). Hypodensity within the inferior pole left kidney likely represents a simple renal cyst. The urinary bladder is otherwise unremarkable. Stomach/Bowel: Stomach is within normal limits. No  evidence of bowel wall thickening or dilatation. The appendix not definitely identified. Vascular/Lymphatic: No abdominal aorta or iliac aneurysm. Severe atherosclerotic plaque of the aorta and its branches. Scattered descending and sigmoid diverticulosis. No abdominal, pelvic, or inguinal lymphadenopathy. Reproductive: Penile pump implant with reservoir within the anterior lower abdomen. No inflammatory changes surrounding the reservoir noted on this noncontrast study. Other: No intraperitoneal free fluid. No intraperitoneal free gas. No organized fluid collection. Musculoskeletal: No abdominal wall hernia or abnormality. No suspicious lytic or blastic osseous lesions. No acute displaced fracture. CT LUMBAR SPINE: Segmentation: 5 non-rib-bearing lumbar vertebral bodies. Alignment: Normal. Vertebral body/inter vertebral spaces: Multilevel moderate severe degenerative change of the spine with moderate severe intervertebral disc space narrowing. No suspicious lytic or blastic osseous lesions. Redemonstration of a well-defined sclerotic lesion within the L3 vertebral body that likely represents a bone island. No severe osseous neural foraminal or central canal stenosis. Soft tissues: Unremarkable. IMPRESSION: 1. Interval placement of a left ureteral stent in grossly appropriate position. Couple associated 2 and 41mm ureterolithiasis within the mid left ureter. Interval slight worsening of mild left hydroureteronephrosis. Associated peri-ureteral fat stranding may be reactive versus could represent an infection with limited evaluation on this noncontrast study. Correlate with urinalysis for infection. 2. Status post right nephrectomy. 3. Colonic diverticulosis with no acute diverticulitis. 4. No acute displaced fracture or traumatic listhesis of the lumbar spine in a patient with multilevel severe degenerative changes. Electronically Signed   By: Iven Finn M.D.   On: 06/23/2020 15:56        Scheduled  Meds: . apixaban  5 mg Oral BID  . feeding supplement  1 Container Oral BID BM  . fluticasone furoate-vilanterol  1 puff Inhalation Daily  . loratadine  10 mg Oral Daily  . multivitamin with minerals  1 tablet Oral Daily  . riluzole  50 mg Oral Q12H  . umeclidinium bromide  1 puff Inhalation Daily   Continuous Infusions: . cefTRIAXone (ROCEPHIN)  IV       LOS: 0 days    Time spent: 35 minutes    Amore Ackman Darleen Crocker, DO [facility]  If 7PM-7AM, please contact night-coverage [email or web address] 06/24/2020, 10:10 AM

## 2020-06-25 DIAGNOSIS — R1032 Left lower quadrant pain: Secondary | ICD-10-CM

## 2020-06-25 DIAGNOSIS — N10 Acute tubulo-interstitial nephritis: Secondary | ICD-10-CM

## 2020-06-25 DIAGNOSIS — R310 Gross hematuria: Secondary | ICD-10-CM

## 2020-06-25 DIAGNOSIS — N390 Urinary tract infection, site not specified: Secondary | ICD-10-CM

## 2020-06-25 LAB — CBC
HCT: 28.6 % — ABNORMAL LOW (ref 39.0–52.0)
Hemoglobin: 8.4 g/dL — ABNORMAL LOW (ref 13.0–17.0)
MCH: 26.7 pg (ref 26.0–34.0)
MCHC: 29.4 g/dL — ABNORMAL LOW (ref 30.0–36.0)
MCV: 90.8 fL (ref 80.0–100.0)
Platelets: 229 10*3/uL (ref 150–400)
RBC: 3.15 MIL/uL — ABNORMAL LOW (ref 4.22–5.81)
RDW: 17.9 % — ABNORMAL HIGH (ref 11.5–15.5)
WBC: 7 10*3/uL (ref 4.0–10.5)
nRBC: 0 % (ref 0.0–0.2)

## 2020-06-25 LAB — BASIC METABOLIC PANEL
Anion gap: 8 (ref 5–15)
BUN: 17 mg/dL (ref 8–23)
CO2: 25 mmol/L (ref 22–32)
Calcium: 9.1 mg/dL (ref 8.9–10.3)
Chloride: 105 mmol/L (ref 98–111)
Creatinine, Ser: 0.8 mg/dL (ref 0.61–1.24)
GFR, Estimated: >60 mL/min (ref >60)
Glucose, Bld: 105 mg/dL — ABNORMAL HIGH (ref 70–99)
Potassium: 3.9 mmol/L (ref 3.5–5.1)
Sodium: 138 mmol/L (ref 135–145)

## 2020-06-25 LAB — URINE CULTURE

## 2020-06-25 LAB — MAGNESIUM: Magnesium: 2.1 mg/dL (ref 1.7–2.4)

## 2020-06-25 MED ORDER — APIXABAN 5 MG PO TABS
5.0000 mg | ORAL_TABLET | Freq: Two times a day (BID) | ORAL | Status: DC
  Administered 2020-06-25 – 2020-06-26 (×3): 5 mg via ORAL
  Filled 2020-06-25 (×3): qty 1

## 2020-06-25 NOTE — Consult Note (Signed)
Urology Consult  Referring physician: Dr. Manuella Ghazi Reason for referral: Pyelonephritis with indwelling stent, gross hematuria  Chief Complaint: gross hematuria  History of Present Illness: Jerome Mays is a 80yo with a history of nephrolithiasis and recent PE admitted with left flank pain, hematuria. He has a hx of left ureteral calculus in a solitary kidney and underwent left ureteral stent placement on 12/23 with Dr. Milford Cage. He was also found to have a PE at that time and was started on Eliquis. Since discharge he has had intermittent left flank pain and intermittent gross hematuria. He underwent left ureteroscopic stone extraction on 06/18/2020 and the left ureteral stent was replaced.  He has urinary frequency, urgency, and dysuria with the stent in place. No other associated symptoms. No exacerbating/alleviaitng events. UA is concerning for infection and patient was started on broad spectrum antibiotics. CT shows stent in good position with mild left hydronephrosis and perinephric stranding.  Past Medical History:  Diagnosis Date  . ALS (amyotrophic lateral sclerosis) ([place]) 2021  . Anemia    low iron  . Arthritis   . Atypical chest pain 01/04/2020  . Colonic polyp   . Constipation   . COPD (chronic obstructive pulmonary disease) ([place])   . Coronary artery calcification seen on CAT scan 01/04/2020  . GERD (gastroesophageal reflux disease)   . Gout   . History of kidney stones   . Malignant tumor of kidney ([place])   . Muscle atrophy   . Pain    LOWER BACK AND LEFT HIP - HX OF PREVIOUS LUMBAR AND CERVICAL SURGERY--PT STATES HE HAS HAD NUMBNESS IN BOTH FEET SINCE HIS BACK SURGERY  . Pure hypercholesterolemia 01/04/2020  . Renal calculus   . Retinal detachment   . Shortness of breath 01/04/2020   Past Surgical History:  Procedure Laterality Date  . BACK SURGERY     LUMBAR SURGERY  . CATARACT EXTRACTION Bilateral 2003   Surgeon unknown  . CYSTOSCOPY W/ URETERAL STENT PLACEMENT Left 05/30/2020    Procedure: CYSTOSCOPY URETERAL STENT PLACEMENT;  Surgeon: Remi Haggard, MD;  Location[facility];  Service: Urology;  Laterality: Left;  . CYSTOSCOPY/URETEROSCOPY/HOLMIUM LASER/STENT PLACEMENT Left 06/18/2020   Procedure: CYSTOSCOPY/URETEROSCOPY/HOLMIUM LASER/STENT EXCHANGE;  Surgeon: Remi Haggard, MD;  Location[facility];  Service: Urology;  Laterality: Left;  1 HR  . EYE SURGERY     BILATERAL CATARACT EXTRACTIONS  . GIVENS CAPSULE STUDY N/A 10/07/2015   Procedure: GIVENS CAPSULE STUDY;  Surgeon: Carol Ada, MD;  Location: [facility];  Service: Endoscopy;  Laterality: N/A;  . HAND SURGERY     left  . KNEE SURGERY     right  . LAMINECTOMY     cervical  . LASER PHOTO ABLATION Left 08/19/2017   Procedure: LASER PHOTO ABLATION;  Surgeon: [name], MD;  Location: [facility];  Service: Ophthalmology;  Laterality: Left;  . MEMBRANE PEEL Left 03/22/2017   Procedure: POSSIBLE MEMBRANE PEEL WITH SILICONE OIL AND PERFLURON;  Surgeon: [name], MD;  Location: [facility];  Service: Ophthalmology;  Laterality: Left;  . NEPHRECTOMY     PT STATES RIGHT KIDNEY WAS REMOVED  . PAROTIDECTOMY Right 06/13/2018   Procedure: RIGHT TOTAL PAROTIDECTOMY;  Surgeon: Leta Baptist, MD;  Location: [facility];  Service: ENT;  Laterality: Right;  . PARS PLANA VITRECTOMY Left 03/22/2017   Procedure: PARS PLANA VITRECTOMY WITH 25 GAUGE;  Surgeon: [name], MD;  Location: [facility];  Service: Ophthalmology;  Laterality: Left;  . PARS PLANA VITRECTOMY W/ SCLERAL  BUCKLE Left 08/19/2017   Procedure: 9 GAUGE PARS PLANA VITRECTOMY WITH SILCONE OIL REMOVAL;  Surgeon: [name], MD;  Location: [facility];  Service: Ophthalmology;  Laterality: Left;  . 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 SALIVARY STONE REMOVAL Right 02/21/2019   Procedure: RIGHT PAROTID FISTULA REPAIR;  Surgeon: Leta Baptist, MD;  Location: [facility];  Service: ENT;  Laterality: Right;  . SHOULDER SURGERY  2020   [facility]  . TOTAL KNEE  ARTHROPLASTY Left 10/19/2012   Procedure: LEFT TOTAL KNEE ARTHROPLASTY;  Surgeon: Magnus Sinning, MD;  [facility];  Service: Orthopedics;  Laterality: Left;  [name] VITRECTOMY 25 GAUGE WITH SCLERAL BUCKLE Left 02/17/2017   Procedure: VITRECTOMY 25 GAUGE WITH SCLERAL BUCKLE membrane peel, injection of silicone oil, and endolaser photocoagulation.;  Surgeon: [name], MD;  Location: [facility];  Service: Ophthalmology;  Laterality: Left;    Medications: I have reviewed the patient's current medications. Allergies:  Allergies  Allergen Reactions  . Lipitor [Atorvastatin] Rash and Other (See Comments)    Passed out    Family History  Problem Relation Age of Onset  . Diabetes Mother   . COPD Brother   . COPD Sister   . Heart attack Father   . Amblyopia Neg Hx   . Blindness Neg Hx   . Cataracts Neg Hx   . Glaucoma Neg Hx   . Macular degeneration Neg Hx   . Retinal detachment Neg Hx   . Retinitis pigmentosa Neg Hx    Social History:  reports that he quit smoking about 25 years ago. His smoking use included cigarettes and pipe. He has a 80.00 pack-year smoking history. He has never used smokeless tobacco. He reports that he does not drink alcohol and does not use drugs.  Review of Systems  Genitourinary: Positive for flank pain and hematuria.  All other systems reviewed and are negative.   Physical Exam:  Vital signs in last 24 hours: Temp:  [98.2 F (36.8 C)-98.8 F (37.1 C)] 98.2 F (36.8 C) (01/18 1030) Pulse Rate:  [65-98] 98 (01/18 1030) Resp:  [14-22] 18 (01/18 1030) BP: (111-128)/(55-72) 111/72 (01/18 1030) SpO2:  [95 %-97 %] 97 % (01/18 1030) Weight:  [78.5 kg-78.7 kg] 78.7 kg (01/18 0609) Physical Exam Vitals and nursing note reviewed.  Constitutional:      Appearance: Normal appearance.  HENT:     Head: Normocephalic and atraumatic.     Nose: Nose normal.     Mouth/Throat:     Mouth: Mucous membranes are dry.  Eyes:     Extraocular Movements: Extraocular  movements intact.     Pupils: Pupils are equal, round, and reactive to light.  Cardiovascular:     Rate and Rhythm: Normal rate and regular rhythm.  Pulmonary:     Effort: Pulmonary effort is normal. No respiratory distress.  Abdominal:     General: Abdomen is flat. There is no distension.  Musculoskeletal:        General: No swelling. Normal range of motion.     Cervical back: Normal range of motion and neck supple.  Skin:    General: Skin is warm and dry.  Neurological:     General: No focal deficit present.     Mental Status: He is alert and oriented to person, place, and time.  Psychiatric:        Mood and Affect: Mood normal.        Behavior: Behavior normal.  Thought Content: Thought content normal.        Judgment: Judgment normal.     Laboratory Data:  Results for orders placed or performed during the hospital encounter of 06/23/20 (from the past 72 hour(s))  CBC with Differential/Platelet     Status: Abnormal   Collection Time: 06/23/20  3:20 PM  Result Value Ref Range   WBC 12.2 (H) 4.0 - 10.5 K/uL   RBC 3.26 (L) 4.22 - 5.81 MIL/uL   Hemoglobin 8.8 (L) 13.0 - 17.0 g/dL   HCT 29.9 (L) 39.0 - 52.0 %   MCV 91.7 80.0 - 100.0 fL   MCH 27.0 26.0 - 34.0 pg   MCHC 29.4 (L) 30.0 - 36.0 g/dL   RDW 17.4 (H) 11.5 - 15.5 %   Platelets 221 150 - 400 K/uL   nRBC 0.0 0.0 - 0.2 %   Neutrophils Relative % 89 %   Neutro Abs 10.8 (H) 1.7 - 7.7 K/uL   Lymphocytes Relative 4 %   Lymphs Abs 0.5 (L) 0.7 - 4.0 K/uL   Monocytes Relative 6 %   Monocytes Absolute 0.8 0.1 - 1.0 K/uL   Eosinophils Relative 0 %   Eosinophils Absolute 0.0 0.0 - 0.5 K/uL   Basophils Relative 0 %   Basophils Absolute 0.0 0.0 - 0.1 K/uL   Immature Granulocytes 1 %   Abs Immature Granulocytes 0.06 0.00 - 0.07 K/uL    Comment: Performed at [facility], [address] [place], [place] [address]  Comprehensive metabolic panel     Status: Abnormal   Collection Time: 06/23/20  3:20 PM  Result Value Ref  Range   Sodium 136 135 - 145 mmol/L   Potassium 4.1 3.5 - 5.1 mmol/L   Chloride 103 98 - 111 mmol/L   CO2 24 22 - 32 mmol/L   Glucose, Bld 136 (H) 70 - 99 mg/dL    Comment: Glucose reference range applies only to samples taken after fasting for at least 8 hours.   BUN 17 8 - 23 mg/dL   Creatinine, Ser 0.92 0.61 - 1.24 mg/dL   Calcium 9.4 8.9 - 10.3 mg/dL   Total Protein 6.3 (L) 6.5 - 8.1 g/dL   Albumin 3.3 (L) 3.5 - 5.0 g/dL   AST 22 15 - 41 U/L   ALT 16 0 - 44 U/L   Alkaline Phosphatase 67 38 - 126 U/L   Total Bilirubin 0.8 0.3 - 1.2 mg/dL   GFR, Estimated >60 >60 mL/min    Comment: (NOTE) Calculated using the CKD-EPI Creatinine Equation (2021)    Anion gap 9 5 - 15    Comment: Performed at [facility], [address] [place], [place] [address]  Urinalysis, Routine w reflex microscopic Urine, Clean Catch     Status: Abnormal   Collection Time: 06/23/20  3:51 PM  Result Value Ref Range   Color, Urine RED (A) YELLOW   APPearance CLOUDY (A) CLEAR   Specific Gravity, Urine 1.013 1.005 - 1.030   pH 7.0 5.0 - 8.0   Glucose, UA NEGATIVE NEGATIVE mg/dL   Hgb urine dipstick LARGE (A) NEGATIVE   Bilirubin Urine NEGATIVE NEGATIVE   Ketones, ur NEGATIVE NEGATIVE mg/dL   Protein, ur 100 (A) NEGATIVE mg/dL   Nitrite NEGATIVE NEGATIVE   Leukocytes,Ua MODERATE (A) NEGATIVE   RBC / HPF >50 (H) 0 - 5 RBC/hpf   WBC, UA >50 (H) 0 - 5 WBC/hpf   Bacteria, UA MANY (A) NONE SEEN   Squamous Epithelial /  LPF 0-5 0 - 5    Comment: Performed at [facility], [address], [place], [place] [address]  Resp Panel by RT-PCR (Flu A&B, Covid) Nasopharyngeal Swab     Status: None   Collection Time: 06/23/20  5:01 PM   Specimen: Nasopharyngeal Swab; Nasopharyngeal(NP) swabs in vial transport medium  Result Value Ref Range   SARS Coronavirus 2 by RT PCR NEGATIVE NEGATIVE    Comment: (NOTE) SARS-CoV-2 target nucleic acids are NOT DETECTED.  The SARS-CoV-2 RNA is generally detectable in upper  respiratory specimens during the acute phase of infection. The lowest concentration of SARS-CoV-2 viral copies this assay can detect is 138 copies/mL. A negative result does not preclude SARS-Cov-2 infection and should not be used as the sole basis for treatment or other patient management decisions. A negative result may occur with  improper specimen collection/handling, submission of specimen other than nasopharyngeal swab, presence of viral mutation(s) within the areas targeted by this assay, and inadequate number of viral copies(<138 copies/mL). A negative result must be combined with clinical observations, patient history, and epidemiological information. The expected result is Negative.  Fact Sheet for Patients:  EntrepreneurPulse.com.au  Fact Sheet for Healthcare Providers:  IncredibleEmployment.be  This test is no t yet approved or cleared by the [place] FDA and  has been authorized for detection and/or diagnosis of SARS-CoV-2 by FDA under an Emergency Use Authorization (EUA). This EUA will remain  in effect (meaning this test can be used) for the duration of the COVID-19 declaration under Section 564(b)(1) of the Act, 21 U.S.C.section 360bbb-3(b)(1), unless the authorization is terminated  or revoked sooner.       Influenza A by PCR NEGATIVE NEGATIVE   Influenza B by PCR NEGATIVE NEGATIVE    Comment: (NOTE) The Xpert Xpress SARS-CoV-2/FLU/RSV plus assay is intended as an aid in the diagnosis of influenza from Nasopharyngeal swab specimens and should not be used as a sole basis for treatment. Nasal washings and aspirates are unacceptable for Xpert Xpress SARS-CoV-2/FLU/RSV testing.  Fact Sheet for Patients: EntrepreneurPulse.com.au  Fact Sheet for Healthcare Providers: IncredibleEmployment.be  This test is not yet approved or cleared by the [place] FDA and has been authorized for  detection and/or diagnosis of SARS-CoV-2 by FDA under an Emergency Use Authorization (EUA). This EUA will remain in effect (meaning this test can be used) for the duration of the COVID-19 declaration under Section 564(b)(1) of the Act, 21 U.S.C. section 360bbb-3(b)(1), unless the authorization is terminated or revoked.  Performed at [facility], [address] [place], [place] [address]   Comprehensive metabolic panel     Status: Abnormal   Collection Time: 06/24/20  4:30 AM  Result Value Ref Range   Sodium 140 135 - 145 mmol/L   Potassium 4.1 3.5 - 5.1 mmol/L   Chloride 109 98 - 111 mmol/L   CO2 27 22 - 32 mmol/L   Glucose, Bld 110 (H) 70 - 99 mg/dL    Comment: Glucose reference range applies only to samples taken after fasting for at least 8 hours.   BUN 15 8 - 23 mg/dL   Creatinine, Ser 0.77 0.61 - 1.24 mg/dL   Calcium 9.0 8.9 - 10.3 mg/dL   Total Protein 5.8 (L) 6.5 - 8.1 g/dL   Albumin 2.8 (L) 3.5 - 5.0 g/dL   AST 21 15 - 41 U/L   ALT 12 0 - 44 U/L   Alkaline Phosphatase 59 38 - 126 U/L   Total Bilirubin 0.5 0.3 -  1.2 mg/dL   GFR, Estimated >60 >60 mL/min    Comment: (NOTE) Calculated using the CKD-EPI Creatinine Equation (2021)    Anion gap 4 (L) 5 - 15    Comment: Performed at [facility], [address], [place], [place] [address]  Magnesium     Status: None   Collection Time: 06/24/20  4:30 AM  Result Value Ref Range   Magnesium 2.3 1.7 - 2.4 mg/dL    Comment: Performed at [facility], [address], [place], [place] [address]  CBC WITH DIFFERENTIAL     Status: Abnormal   Collection Time: 06/24/20  4:30 AM  Result Value Ref Range   WBC 7.2 4.0 - 10.5 K/uL   RBC 3.01 (L) 4.22 - 5.81 MIL/uL   Hemoglobin 8.0 (L) 13.0 - 17.0 g/dL   HCT 27.1 (L) 39.0 - 52.0 %   MCV 90.0 80.0 - 100.0 fL   MCH 26.6 26.0 - 34.0 pg   MCHC 29.5 (L) 30.0 - 36.0 g/dL   RDW 17.6 (H) 11.5 - 15.5 %   Platelets 217 150 - 400 K/uL   nRBC 0.0 0.0 - 0.2 %   Neutrophils Relative % 75 %    Neutro Abs 5.3 1.7 - 7.7 K/uL   Lymphocytes Relative 15 %   Lymphs Abs 1.1 0.7 - 4.0 K/uL   Monocytes Relative 8 %   Monocytes Absolute 0.6 0.1 - 1.0 K/uL   Eosinophils Relative 2 %   Eosinophils Absolute 0.2 0.0 - 0.5 K/uL   Basophils Relative 0 %   Basophils Absolute 0.0 0.0 - 0.1 K/uL   Immature Granulocytes 0 %   Abs Immature Granulocytes 0.03 0.00 - 0.07 K/uL    Comment: Performed at [facility], [address], [place], [place] [address]  Basic metabolic panel     Status: Abnormal   Collection Time: 06/25/20  6:22 AM  Result Value Ref Range   Sodium 138 135 - 145 mmol/L   Potassium 3.9 3.5 - 5.1 mmol/L   Chloride 105 98 - 111 mmol/L   CO2 25 22 - 32 mmol/L   Glucose, Bld 105 (H) 70 - 99 mg/dL    Comment: Glucose reference range applies only to samples taken after fasting for at least 8 hours.   BUN 17 8 - 23 mg/dL   Creatinine, Ser 0.80 0.61 - 1.24 mg/dL   Calcium 9.1 8.9 - 10.3 mg/dL   GFR, Estimated >60 >60 mL/min    Comment: (NOTE) Calculated using the CKD-EPI Creatinine Equation (2021)    Anion gap 8 5 - 15    Comment: Performed at [facility], [address] [place], [place] [address]  Magnesium     Status: None   Collection Time: 06/25/20  6:22 AM  Result Value Ref Range   Magnesium 2.1 1.7 - 2.4 mg/dL    Comment: Performed at [facility], [address] [place], [place] [address]  CBC     Status: Abnormal   Collection Time: 06/25/20  6:22 AM  Result Value Ref Range   WBC 7.0 4.0 - 10.5 K/uL   RBC 3.15 (L) 4.22 - 5.81 MIL/uL   Hemoglobin 8.4 (L) 13.0 - 17.0 g/dL   HCT 28.6 (L) 39.0 - 52.0 %   MCV 90.8 80.0 - 100.0 fL   MCH 26.7 26.0 - 34.0 pg   MCHC 29.4 (L) 30.0 - 36.0 g/dL   RDW 17.9 (H) 11.5 - 15.5 %   Platelets 229 150 - 400 K/uL  nRBC 0.0 0.0 - 0.2 %    Comment: Performed at [facility], [address], [place], [place] [address]   Recent Results (from the past 240 hour(s))  SARS CORONAVIRUS 2 (TAT 6-24 HRS) Nasopharyngeal Nasopharyngeal Swab      Status: None   Collection Time: 06/17/20 12:01 PM   Specimen: Nasopharyngeal Swab  Result Value Ref Range Status   SARS Coronavirus 2 NEGATIVE NEGATIVE Final    Comment: (NOTE) SARS-CoV-2 target nucleic acids are NOT DETECTED.  The SARS-CoV-2 RNA is generally detectable in upper and lower respiratory specimens during the acute phase of infection. Negative results do not preclude SARS-CoV-2 infection, do not rule out co-infections with other pathogens, and should not be used as the sole basis for treatment or other patient management decisions. Negative results must be combined with clinical observations, patient history, and epidemiological information. The expected result is Negative.  Fact Sheet for Patients: SugarRoll.be  Fact Sheet for Healthcare Providers: https://www.woods-mathews.com/  This test is not yet approved or cleared by the [place] FDA and  has been authorized for detection and/or diagnosis of SARS-CoV-2 by FDA under an Emergency Use Authorization (EUA). This EUA will remain  in effect (meaning this test can be used) for the duration of the COVID-19 declaration under Se ction 564(b)(1) of the Act, 21 U.S.C. section 360bbb-3(b)(1), unless the authorization is terminated or revoked sooner.  Performed at [facility], [place] [address], [place], [place] [address]   Resp Panel by RT-PCR (Flu A&B, Covid) Nasopharyngeal Swab     Status: None   Collection Time: 06/23/20  5:01 PM   Specimen: Nasopharyngeal Swab; Nasopharyngeal(NP) swabs in vial transport medium  Result Value Ref Range Status   SARS Coronavirus 2 by RT PCR NEGATIVE NEGATIVE Final    Comment: (NOTE) SARS-CoV-2 target nucleic acids are NOT DETECTED.  The SARS-CoV-2 RNA is generally detectable in upper respiratory specimens during the acute phase of infection. The lowest concentration of SARS-CoV-2 viral copies this assay can detect is 138 copies/mL. A  negative result does not preclude SARS-Cov-2 infection and should not be used as the sole basis for treatment or other patient management decisions. A negative result may occur with  improper specimen collection/handling, submission of specimen other than nasopharyngeal swab, presence of viral mutation(s) within the areas targeted by this assay, and inadequate number of viral copies(<138 copies/mL). A negative result must be combined with clinical observations, patient history, and epidemiological information. The expected result is Negative.  Fact Sheet for Patients:  EntrepreneurPulse.com.au  Fact Sheet for Healthcare Providers:  IncredibleEmployment.be  This test is no t yet approved or cleared by the [place] FDA and  has been authorized for detection and/or diagnosis of SARS-CoV-2 by FDA under an Emergency Use Authorization (EUA). This EUA will remain  in effect (meaning this test can be used) for the duration of the COVID-19 declaration under Section 564(b)(1) of the Act, 21 U.S.C.section 360bbb-3(b)(1), unless the authorization is terminated  or revoked sooner.       Influenza A by PCR NEGATIVE NEGATIVE Final   Influenza B by PCR NEGATIVE NEGATIVE Final    Comment: (NOTE) The Xpert Xpress SARS-CoV-2/FLU/RSV plus assay is intended as an aid in the diagnosis of influenza from Nasopharyngeal swab specimens and should not be used as a sole basis for treatment. Nasal washings and aspirates are unacceptable for Xpert Xpress SARS-CoV-2/FLU/RSV testing.  Fact Sheet for Patients: EntrepreneurPulse.com.au  Fact Sheet for Healthcare Providers: IncredibleEmployment.be  This test is  not yet approved or cleared by the [place] and has been authorized for detection and/or diagnosis of SARS-CoV-2 by FDA under an Emergency Use Authorization (EUA). This EUA will remain in effect (meaning this test can  be used) for the duration of the COVID-19 declaration under Section 564(b)(1) of the Act, 21 U.S.C. section 360bbb-3(b)(1), unless the authorization is terminated or revoked.  Performed at [facility], [address], [place], [place] [address]    Creatinine: Recent Labs    06/23/20 1520 06/24/20 0430 06/25/20 0622  CREATININE 0.92 0.77 0.80   Baseline Creatinine: 0.8  Impression/Assessment:  79yo with left ureteral calculus, gross hematuria and concern for UTI  Plan:  I discussed the management of pyelonephritis with the patient and currently due to concern for UTI the patient cannot have his ureteral stent removed.[name] He will be placed on culture specific antibiotics for 1 week and he will be rescheduled for ureteral stent removal. The patient gross hematuria is likely related to his ureteral stent and since he is emptying his bladder appropriately he does not require CBI at this time.  Please continue broad spectrum antibiotics pending his urine culture  Nicolette Bang 06/25/2020, 1:10 PM

## 2020-06-25 NOTE — Evaluation (Signed)
Physical Therapy Evaluation Patient Details Name: Jerome Mays MRN: [id number] DOB: [birth year] Today's Date: 06/25/2020   History of Present Illness  Jerome Mays  is a 80 y.o. 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  Patient reports that the pain started last night.  It was gradual in onset and, constant but did wax and wane.  He reports associated urinary frequency, hematuria as well.  Patient denies any fever.  He does report that he fell down yesterday.  He describes it as a mechanical fall where his leg went out forwards and he fell backwards onto his left hip.  He reports stinging and burning pain.  He did not hit his head.  He had no preceding symptoms such as chest pain, palpitations, shortness of breath, dizziness.  He has not taken anything for pain at home even though he is prescribed oxycodone.  Patient presented to the ED today because the left flank pain is "almost too much to stand."   Clinical Impression   Patient demonstrates generalized weakness resulting in reduced functional activity tolerance and decreased ability to perform functional transfers due to general LE weakness requiring physical assistance for bed mobility and transfers at this time.  Demonstrates standing balance and tolerance deficits also due to aforementioned weakness with unsteadiness on feet present requiring min A for ambulation on level surfaces due to gait deficits.  Patient would benefit from continued PT services whilst hospitalized to improve mobility and ambulation to reduce level of assistance from caregivers and reduce risk for falls.  HHPT services recommended at D/C for f/u care to address deficits and limitations.    Follow Up Recommendations Home health PT    Equipment  Recommendations  None recommended by PT    Recommendations for Other Services       Precautions / Restrictions        Mobility  Bed Mobility Overal bed mobility: Needs Assistance Bed Mobility: Supine to Sit;Sit to Supine     Supine to sit: Mod assist Sit to supine: Mod assist     Patient Response: Cooperative  Transfers Overall transfer level: Needs assistance Equipment used: 1 person hand held assist Transfers: Sit to/from [facility] Sit to Stand: Min assist Stand pivot transfers: Min assist       General transfer comment: unable to use UE due to weakness from ALS  Ambulation/Gait Ambulation/Gait assistance: Min assist Gait Distance (Feet): 12 Feet Assistive device: 1 person hand held assist Gait Pattern/deviations: Wide base of support;Decreased stride length Gait velocity: decreased      Stairs            Wheelchair Mobility    Modified Rankin (Stroke Patients Only)       Balance Overall balance assessment: Needs assistance   Sitting balance-Leahy Scale: Good       Standing balance-Leahy Scale: Fair                               Pertinent Vitals/Pain Pain Assessment: No/denies pain (pt reports left hip pain when he moves it a certain way, none identified during evaluation)    Home Living Family/patient expects to be discharged to:: Private residence Living Arrangements: Spouse/significant other Available Help at Discharge: Family Type of Home:  House Home Access: Stairs to enter Entrance Stairs-Rails: Right Entrance Stairs-Number of Steps: 2 Home Layout: Able to live on main level with bedroom/bathroom Home Equipment: Hand held shower head      Prior Function Level of Independence: Needs assistance   Gait / Transfers Assistance Needed: independent  ADL's / Homemaking Assistance Needed: assist required for bathing and dressing due to limited use of UEs        Hand Dominance         Extremity/Trunk Assessment   Upper Extremity Assessment Upper Extremity Assessment: Defer to OT evaluation    Lower Extremity Assessment Lower Extremity Assessment: Generalized weakness       Communication   Communication: No difficulties  Cognition                                              General Comments      Exercises     Assessment/Plan    PT Assessment Patient needs continued PT services  PT Problem List Decreased strength;Decreased activity tolerance;Decreased balance;Decreased knowledge of use of DME;Decreased coordination;Decreased mobility       PT Treatment Interventions DME instruction;Gait training;Stair training;Functional mobility training;Therapeutic activities;Patient/family education;Neuromuscular re-education;Balance training;Therapeutic exercise;Manual techniques    PT Goals (Current goals can be found in the Care Plan section)  Acute Rehab PT Goals Patient Stated Goal: Be able to move around better without LOB PT Goal Formulation: With patient/family Time For Goal Achievement: 07/09/20 Potential to Achieve Goals: Good    Frequency Min 3X/week   Barriers to discharge        Co-evaluation               AM-PAC PT "6 Clicks" Mobility  Outcome Measure Help needed turning from your back to your side while in a flat bed without using bedrails?: A Lot Help needed moving from lying on your back to sitting on the side of a flat bed without using bedrails?: A Lot Help needed moving to and from a bed to a chair (including a wheelchair)?: A Little Help needed standing up from a chair using your arms (e.g., wheelchair or bedside chair)?: A Little Help needed to walk in hospital room?: A Little Help needed climbing 3-5 steps with a railing? : A Lot 6 Click Score: 15    End of Session Equipment Utilized During Treatment: Gait belt Activity Tolerance: Patient limited by fatigue Patient left: in chair;with call bell/phone  within reach;with family/visitor present Nurse Communication: Mobility status PT Visit Diagnosis: Unsteadiness on feet (R26.81);Muscle weakness (generalized) (M62.81);Difficulty in walking, not elsewhere classified (R26.2)    Time: 5621-3086 PT Time Calculation (min) (ACUTE ONLY): 25 min   Charges:   PT Evaluation $PT Eval Low Complexity: 1 Low PT Treatments $Therapeutic Activity: 8-22 mins       9:15 AM, 06/25/20 M. Sherlyn Lees, PT, DPT Physical Therapist- [place] Office Number: [phone]

## 2020-06-25 NOTE — Progress Notes (Signed)
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  CT of the abdomen and pelvis demonstrated some mild ureterolithiasis with some mild left hydronephrosis noted.  He was admitted with UTI/pyelonephritis and empirically started on Rocephin.  Case discussed with urology with no need for intervention at this time.  Continue empiric treatment and follow urine culture that is pending. PT evaluation with need for home health PT noted.  Assessment & Plan:   Principal Problem:   UTI (urinary tract infection) Active Problems:   COPD, group B, by GOLD 2017 classification ([place])   Pulmonary emboli (HCC)   Mild protein-calorie malnutrition (HCC)  UTI/left-sided pyelonephritis -In setting of recent JJ stent placement and revision -Continue empiric Rocephin -Follow urine culture -Mild hydronephrosis and nephrolithiasis noted on the outside, but stable creatinine.  Discussed with urology Dr. Alyson Ingles with no need for intervention at this time.  May discharge home with oral antibiotics once cultures return and follow-up with urology outpatient.  History of PE -Home Eliquis to resume today -No further hematuria noted -Follow CBC, which has remained stable  Mild protein calorie malnutrition -Albumin 3.3 -Boost supplement  History of COPD -Continue Trelegy -Chronic hypoxemia and wears 2 L nasal cannula  History of ALS -Continue home medications -Has chronic weakness to the bilateral upper extremities  DVT prophylaxis: Eliquis to resume 1/18 Code Status: Full Family Communication: Discussed with wife, [name] at bedside  1/18 Disposition Plan:  Status is: Inpatient  Remains inpatient appropriate because:IV treatments appropriate due to intensity of illness or inability to take PO and Inpatient level of care appropriate due to severity of illness   Dispo: The patient is from: Home              Anticipated d/c is to: Home              Anticipated d/c date is: 1 day              Patient currently is not medically stable to d/c.  Patient requires continued IV antibiotics and follow-up on urine cultures prior to discharge home with anticipated home health PT.   Nutritional Assessment:  The patient's BMI is: Body mass index is 23.48 kg/m.[name]  Consultants:   Spoke with Urology Dr. Alyson Ingles on 1/17  Procedures:   See below  Antimicrobials:  Anti-infectives (From admission, onward)   Start     Dose/Rate Route Frequency Ordered Stop   06/24/20 1700  cefTRIAXone (ROCEPHIN) 1 g in sodium chloride 0.9 % 100 mL IVPB        1 g 200 mL/hr over 30 Minutes Intravenous Every 24 hours 06/23/20 1817     06/23/20 1715  cefTRIAXone (ROCEPHIN) 1 g in sodium chloride 0.9 % 100 mL IVPB        1 g 200 mL/hr over 30 Minutes Intravenous  Once 06/23/20 1708 06/23/20 1816       Subjective: Patient seen and evaluated today with no new acute complaints or concerns. No acute concerns or events noted overnight.  He denies any further flank pain, however he has some complaints about radiating numbness and pain down his left lower extremity.  Objective: Vitals:  06/25/20 0431 06/25/20 0609 06/25/20 0704 06/25/20 1030  BP: 121/69   111/72  Pulse: 76   98  Resp: 20   18  Temp: 98.6 F (37 C)   98.2 F (36.8 C)  TempSrc:    Oral  SpO2: 96% 95% 95% 97%  Weight:  78.7 kg    Height:        Intake/Output Summary (Last 24 hours) at 06/25/2020 1209 Last data filed at 06/25/2020 0600 Gross per 24 hour  Intake --  Output 1400 ml  Net -1400 ml   Filed Weights   06/23/20 1430 06/24/20 1636 06/25/20 0609  Weight:  80.7 kg 78.5 kg 78.7 kg    Examination:  General exam: Appears calm and comfortable  Respiratory system: Clear to auscultation. Respiratory effort normal.  Currently on 2 L nasal cannula oxygen. Cardiovascular system: S1 & S2 heard, RRR.  Gastrointestinal system: Abdomen is soft Central nervous system: Alert and awake Extremities: No edema Skin: No significant lesions noted Psychiatry: Flat affect.    Data Reviewed: I have personally reviewed following labs and imaging studies  CBC: Recent Labs  Lab 06/23/20 1520 06/24/20 0430 06/25/20 0622  WBC 12.2* 7.2 7.0  NEUTROABS 10.8* 5.3  --   HGB 8.8* 8.0* 8.4*  HCT 29.9* 27.1* 28.6*  MCV 91.7 90.0 90.8  PLT 221 217 623   Basic Metabolic Panel: Recent Labs  Lab 06/23/20 1520 06/24/20 0430 06/25/20 0622  NA 136 140 138  K 4.1 4.1 3.9  CL 103 109 105  CO2 24 27 25   GLUCOSE 136* 110* 105*  BUN 17 15 17   CREATININE 0.92 0.77 0.80  CALCIUM 9.4 9.0 9.1  MG  --  2.3 2.1   GFR: Estimated Creatinine Clearance: 80.6 mL/min (by C-G formula based on SCr of 0.8 mg/dL). Liver Function Tests: Recent Labs  Lab 06/23/20 1520 06/24/20 0430  AST 22 21  ALT 16 12  ALKPHOS 67 59  BILITOT 0.8 0.5  PROT 6.3* 5.8*  ALBUMIN 3.3* 2.8*   No results for input(s): LIPASE, AMYLASE in the last 168 hours. No results for input(s): AMMONIA in the last 168 hours. Coagulation Profile: No results for input(s): INR, PROTIME in the last 168 hours. Cardiac Enzymes: No results for input(s): CKTOTAL, CKMB, CKMBINDEX, TROPONINI in the last 168 hours. BNP (last 3 results) No results for input(s): PROBNP in the last 8760 hours. HbA1C: No results for input(s): HGBA1C in the last 72 hours. CBG: No results for input(s): GLUCAP in the last 168 hours. Lipid Profile: No results for input(s): CHOL, HDL, LDLCALC, TRIG, CHOLHDL, LDLDIRECT in the last 72 hours. Thyroid Function Tests: No results for input(s): TSH, T4TOTAL, FREET4, T3FREE, THYROIDAB in  the last 72 hours. Anemia Panel: No results for input(s): VITAMINB12, FOLATE, FERRITIN, TIBC, IRON, RETICCTPCT in the last 72 hours. Sepsis Labs: No results for input(s): PROCALCITON, LATICACIDVEN in the last 168 hours.  Recent Results (from the past 240 hour(s))  SARS CORONAVIRUS 2 (TAT 6-24 HRS) Nasopharyngeal Nasopharyngeal Swab     Status: None   Collection Time: 06/17/20 12:01 PM   Specimen: Nasopharyngeal Swab  Result Value Ref Range Status   SARS Coronavirus 2 NEGATIVE NEGATIVE Final    Comment: (NOTE) SARS-CoV-2 target nucleic acids are NOT DETECTED.  The SARS-CoV-2 RNA is generally detectable in upper and lower respiratory specimens during the acute phase of infection. Negative results do not preclude SARS-CoV-2 infection, do not rule out co-infections with other pathogens, and should not be used as  the sole basis for treatment or other patient management decisions. Negative results must be combined with clinical observations, patient history, and epidemiological information. The expected result is Negative.  Fact Sheet for Patients: SugarRoll.be  Fact Sheet for Healthcare Providers: https://www.woods-mathews.com/  This test is not yet approved or cleared by the [place] FDA and  has been authorized for detection and/or diagnosis of SARS-CoV-2 by FDA under an Emergency Use Authorization (EUA). This EUA will remain  in effect (meaning this test can be used) for the duration of the COVID-19 declaration under Se ction 564(b)(1) of the Act, 21 U.S.C. section 360bbb-3(b)(1), unless the authorization is terminated or revoked sooner.  Performed at [facility], [place] [address], [place], [place] [address]   Resp Panel by RT-PCR (Flu A&B, Covid) Nasopharyngeal Swab     Status: None   Collection Time: 06/23/20  5:01 PM   Specimen: Nasopharyngeal Swab; Nasopharyngeal(NP) swabs in vial transport medium  Result Value Ref Range  Status   SARS Coronavirus 2 by RT PCR NEGATIVE NEGATIVE Final    Comment: (NOTE) SARS-CoV-2 target nucleic acids are NOT DETECTED.  The SARS-CoV-2 RNA is generally detectable in upper respiratory specimens during the acute phase of infection. The lowest concentration of SARS-CoV-2 viral copies this assay can detect is 138 copies/mL. A negative result does not preclude SARS-Cov-2 infection and should not be used as the sole basis for treatment or other patient management decisions. A negative result may occur with  improper specimen collection/handling, submission of specimen other than nasopharyngeal swab, presence of viral mutation(s) within the areas targeted by this assay, and inadequate number of viral copies(<138 copies/mL). A negative result must be combined with clinical observations, patient history, and epidemiological information. The expected result is Negative.  Fact Sheet for Patients:  EntrepreneurPulse.com.au  Fact Sheet for Healthcare Providers:  IncredibleEmployment.be  This test is no t yet approved or cleared by the [place] FDA and  has been authorized for detection and/or diagnosis of SARS-CoV-2 by FDA under an Emergency Use Authorization (EUA). This EUA will remain  in effect (meaning this test can be used) for the duration of the COVID-19 declaration under Section 564(b)(1) of the Act, 21 U.S.C.section 360bbb-3(b)(1), unless the authorization is terminated  or revoked sooner.       Influenza A by PCR NEGATIVE NEGATIVE Final   Influenza B by PCR NEGATIVE NEGATIVE Final    Comment: (NOTE) The Xpert Xpress SARS-CoV-2/FLU/RSV plus assay is intended as an aid in the diagnosis of influenza from Nasopharyngeal swab specimens and should not be used as a sole basis for treatment. Nasal washings and aspirates are unacceptable for Xpert Xpress SARS-CoV-2/FLU/RSV testing.  Fact Sheet for  Patients: EntrepreneurPulse.com.au  Fact Sheet for Healthcare Providers: IncredibleEmployment.be  This test is not yet approved or cleared by the [place] FDA and has been authorized for detection and/or diagnosis of SARS-CoV-2 by FDA under an Emergency Use Authorization (EUA). This EUA will remain in effect (meaning this test can be used) for the duration of the COVID-19 declaration under Section 564(b)(1) of the Act, 21 U.S.C. section 360bbb-3(b)(1), unless the authorization is terminated or revoked.  Performed at [facility], [address], [place], [place] [address]          Radiology Studies: CT ABDOMEN PELVIS WO CONTRAST  Result Date: 06/23/2020 CLINICAL DATA:  Hematuria, unknown cause. Left-sided abdominal pain. Nausea and constipation. EXAM: CT ABDOMEN AND PELVIS WITHOUT CONTRAST CT LUMBAR SPINE without contrast TECHNIQUE: Multidetector CT imaging  of the abdomen and pelvis was performed following the standard protocol without IV contrast. Multiplanar CT reconstructions of the lumbar spine. COMPARISON:  CT abdomen pelvis 05/29/2020 FINDINGS: Lower chest: Coronary artery calcifications. Bilateral lower lobe atelectasis again noted. Calcified granuloma at the right base. Hepatobiliary: No focal liver abnormality. No gallstones, gallbladder wall thickening, or pericholecystic fluid. No biliary dilatation. Pancreas: No focal lesion. Normal pancreatic contour. No surrounding inflammatory changes. No main pancreatic ductal dilatation. Spleen: Normal in size without focal abnormality. Adrenals/Urinary Tract: No adrenal nodule bilaterally. Status post right nephrectomy. Interval placement of a left ureteral stent with proximal pigtail terminating within a an inferior pole calyx and distal pigtail terminating within the urinary bladder lumen. Interval development of Peri-ureteral fat stranding and interval worsening of mild hydroureteronephrosis on the  left. Several nephrolithiasis (2:2:24, 27,30). Couple of 2-4 mm calcifications along the ureteral stent in the mid ureter (2:50). Previously noted proximal ureteral stones are no longer visualized. Couple of layering punctate calcifications within the lumen of the urinary bladder wall (2:74). Hypodensity within the inferior pole left kidney likely represents a simple renal cyst. The urinary bladder is otherwise unremarkable. Stomach/Bowel: Stomach is within normal limits. No evidence of bowel wall thickening or dilatation. The appendix not definitely identified. Vascular/Lymphatic: No abdominal aorta or iliac aneurysm. Severe atherosclerotic plaque of the aorta and its branches. Scattered descending and sigmoid diverticulosis. No abdominal, pelvic, or inguinal lymphadenopathy. Reproductive: Penile pump implant with reservoir within the anterior lower abdomen. No inflammatory changes surrounding the reservoir noted on this noncontrast study. Other: No intraperitoneal free fluid. No intraperitoneal free gas. No organized fluid collection. Musculoskeletal: No abdominal wall hernia or abnormality. No suspicious lytic or blastic osseous lesions. No acute displaced fracture. CT LUMBAR SPINE: Segmentation: 5 non-rib-bearing lumbar vertebral bodies. Alignment: Normal. Vertebral body/inter vertebral spaces: Multilevel moderate severe degenerative change of the spine with moderate severe intervertebral disc space narrowing. No suspicious lytic or blastic osseous lesions. Redemonstration of a well-defined sclerotic lesion within the L3 vertebral body that likely represents a bone island. No severe osseous neural foraminal or central canal stenosis. Soft tissues: Unremarkable. IMPRESSION: 1. Interval placement of a left ureteral stent in grossly appropriate position. Couple associated 2 and 57mm ureterolithiasis within the mid left ureter. Interval slight worsening of mild left hydroureteronephrosis. Associated peri-ureteral fat  stranding may be reactive versus could represent an infection with limited evaluation on this noncontrast study. Correlate with urinalysis for infection. 2. Status post right nephrectomy. 3. Colonic diverticulosis with no acute diverticulitis. 4. No acute displaced fracture or traumatic listhesis of the lumbar spine in a patient with multilevel severe degenerative changes. Electronically Signed   By: Iven Finn M.D.   On: 06/23/2020 15:56   CT L-SPINE NO CHARGE  Result Date: 06/23/2020 CLINICAL DATA:  Hematuria, unknown cause. Left-sided abdominal pain. Nausea and constipation. EXAM: CT ABDOMEN AND PELVIS WITHOUT CONTRAST CT LUMBAR SPINE without contrast TECHNIQUE: Multidetector CT imaging of the abdomen and pelvis was performed following the standard protocol without IV contrast. Multiplanar CT reconstructions of the lumbar spine. COMPARISON:  CT abdomen pelvis 05/29/2020 FINDINGS: Lower chest: Coronary artery calcifications. Bilateral lower lobe atelectasis again noted. Calcified granuloma at the right base. Hepatobiliary: No focal liver abnormality. No gallstones, gallbladder wall thickening, or pericholecystic fluid. No biliary dilatation. Pancreas: No focal lesion. Normal pancreatic contour. No surrounding inflammatory changes. No main pancreatic ductal dilatation. Spleen: Normal in size without focal abnormality. Adrenals/Urinary Tract: No adrenal nodule bilaterally. Status post right nephrectomy. Interval placement of a left  ureteral stent with proximal pigtail terminating within a an inferior pole calyx and distal pigtail terminating within the urinary bladder lumen. Interval development of Peri-ureteral fat stranding and interval worsening of mild hydroureteronephrosis on the left. Several nephrolithiasis (2:2:24, 27,30). Couple of 2-4 mm calcifications along the ureteral stent in the mid ureter (2:50). Previously noted proximal ureteral stones are no longer visualized. Couple of layering punctate  calcifications within the lumen of the urinary bladder wall (2:74). Hypodensity within the inferior pole left kidney likely represents a simple renal cyst. The urinary bladder is otherwise unremarkable. Stomach/Bowel: Stomach is within normal limits. No evidence of bowel wall thickening or dilatation. The appendix not definitely identified. Vascular/Lymphatic: No abdominal aorta or iliac aneurysm. Severe atherosclerotic plaque of the aorta and its branches. Scattered descending and sigmoid diverticulosis. No abdominal, pelvic, or inguinal lymphadenopathy. Reproductive: Penile pump implant with reservoir within the anterior lower abdomen. No inflammatory changes surrounding the reservoir noted on this noncontrast study. Other: No intraperitoneal free fluid. No intraperitoneal free gas. No organized fluid collection. Musculoskeletal: No abdominal wall hernia or abnormality. No suspicious lytic or blastic osseous lesions. No acute displaced fracture. CT LUMBAR SPINE: Segmentation: 5 non-rib-bearing lumbar vertebral bodies. Alignment: Normal. Vertebral body/inter vertebral spaces: Multilevel moderate severe degenerative change of the spine with moderate severe intervertebral disc space narrowing. No suspicious lytic or blastic osseous lesions. Redemonstration of a well-defined sclerotic lesion within the L3 vertebral body that likely represents a bone island. No severe osseous neural foraminal or central canal stenosis. Soft tissues: Unremarkable. IMPRESSION: 1. Interval placement of a left ureteral stent in grossly appropriate position. Couple associated 2 and 67mm ureterolithiasis within the mid left ureter. Interval slight worsening of mild left hydroureteronephrosis. Associated peri-ureteral fat stranding may be reactive versus could represent an infection with limited evaluation on this noncontrast study. Correlate with urinalysis for infection. 2. Status post right nephrectomy. 3. Colonic diverticulosis with no  acute diverticulitis. 4. No acute displaced fracture or traumatic listhesis of the lumbar spine in a patient with multilevel severe degenerative changes. Electronically Signed   By: Iven Finn M.D.   On: 06/23/2020 15:56        Scheduled Meds: . feeding supplement  1 Container Oral BID BM  . fluticasone furoate-vilanterol  1 puff Inhalation Daily  . loratadine  10 mg Oral Daily  . multivitamin with minerals  1 tablet Oral Daily  . riluzole  50 mg Oral Q12H  . umeclidinium bromide  1 puff Inhalation Daily   Continuous Infusions: . cefTRIAXone (ROCEPHIN)  IV 1 g (06/24/20 1826)     LOS: 1 day    Time spent: 35 minutes    Curt Oatis Darleen Crocker, DO [facility]  If 7PM-7AM, please contact night-coverage [email or web address] 06/25/2020, 12:09 PM

## 2020-06-25 NOTE — Plan of Care (Signed)
  Problem: Acute Rehab PT Goals(only PT should resolve) Goal: Pt Will Go Supine/Side To Sit Outcome: Progressing Flowsheets (Taken 06/25/2020 0917) Pt will go Supine/Side to Sit: with min guard assist Goal: Pt Will Go Sit To Supine/Side Outcome: Progressing Flowsheets (Taken 06/25/2020 0917) Pt will go Sit to Supine/Side: with min guard assist Goal: Patient Will Transfer Sit To/From Stand Outcome: Progressing Flowsheets (Taken 06/25/2020 0917) Patient will transfer sit to/from stand: with min guard assist Goal: Pt Will Transfer Bed To Chair/Chair To Bed Outcome: Progressing Flowsheets (Taken 06/25/2020 0917) Pt will Transfer Bed to Chair/Chair to Bed: min guard assist Goal: Pt Will Ambulate Outcome: Progressing Flowsheets (Taken 06/25/2020 0917) Pt will Ambulate:  50 feet  with min guard assist Goal: Pt Will Go Up/Down Stairs Outcome: Progressing Flowsheets (Taken 06/25/2020 0917) Pt will Go Up / Down Stairs:  1-2 stairs  with min guard assist  9:19 AM, 06/25/20 M. Sherlyn Lees, PT, DPT Physical Therapist- [place] Office Number: [phone]

## 2020-06-25 NOTE — TOC Progression Note (Signed)
Transition of Care [facility]) - Progression Note    Patient Details  Name: Jerome Mays MRN: [id number] Date of Birth: 1941-04-20  Transition of Care [facility]) CM/SW Contact  Boneta Lucks, RN Phone Number: 06/25/2020, 1:08 PM  Clinical Narrative:  Discharge planning for patient, to plan to go home tomorrow with HHPT.  TOC to call Jody with [facility] tomorrow with any nutritional needs, IV medications and tube feedings.  [phone] TOC to follow.       Expected Discharge Plan: [place] Barriers to Discharge: Continued Medical Work up  Expected Discharge Plan and Services Expected Discharge Plan: [place]

## 2020-06-26 DIAGNOSIS — J449 Chronic obstructive pulmonary disease, unspecified: Secondary | ICD-10-CM | POA: Diagnosis not present

## 2020-06-26 DIAGNOSIS — N390 Urinary tract infection, site not specified: Secondary | ICD-10-CM | POA: Diagnosis not present

## 2020-06-26 DIAGNOSIS — I2782 Chronic pulmonary embolism: Secondary | ICD-10-CM | POA: Diagnosis not present

## 2020-06-26 DIAGNOSIS — E441 Mild protein-calorie malnutrition: Secondary | ICD-10-CM | POA: Diagnosis not present

## 2020-06-26 DIAGNOSIS — R319 Hematuria, unspecified: Secondary | ICD-10-CM | POA: Diagnosis not present

## 2020-06-26 LAB — CBC
HCT: 28.3 % — ABNORMAL LOW (ref 39.0–52.0)
Hemoglobin: 8.3 g/dL — ABNORMAL LOW (ref 13.0–17.0)
MCH: 26.6 pg (ref 26.0–34.0)
MCHC: 29.3 g/dL — ABNORMAL LOW (ref 30.0–36.0)
MCV: 90.7 fL (ref 80.0–100.0)
Platelets: 218 10*3/uL (ref 150–400)
RBC: 3.12 MIL/uL — ABNORMAL LOW (ref 4.22–5.81)
RDW: 17.7 % — ABNORMAL HIGH (ref 11.5–15.5)
WBC: 6.5 10*3/uL (ref 4.0–10.5)
nRBC: 0 % (ref 0.0–0.2)

## 2020-06-26 LAB — MAGNESIUM: Magnesium: 2.1 mg/dL (ref 1.7–2.4)

## 2020-06-26 LAB — BASIC METABOLIC PANEL
Anion gap: 7 (ref 5–15)
BUN: 19 mg/dL (ref 8–23)
CO2: 26 mmol/L (ref 22–32)
Calcium: 9 mg/dL (ref 8.9–10.3)
Chloride: 106 mmol/L (ref 98–111)
Creatinine, Ser: 0.8 mg/dL (ref 0.61–1.24)
GFR, Estimated: >60 mL/min (ref >60)
Glucose, Bld: 96 mg/dL (ref 70–99)
Potassium: 3.8 mmol/L (ref 3.5–5.1)
Sodium: 139 mmol/L (ref 135–145)

## 2020-06-26 MED ORDER — CEFDINIR 300 MG PO CAPS: 600.0000 mg | ORAL_CAPSULE | Freq: Every day | ORAL | 0 refills | Status: AC

## 2020-06-26 MED ORDER — KETOROLAC TROMETHAMINE 0.5 % OP SOLN: 1.0000 [drp] | Freq: Two times a day (BID) | OPHTHALMIC | Status: DC

## 2020-06-26 MED ORDER — SODIUM CHLORIDE (HYPERTONIC) 5 % OP SOLN: 1.0000 [drp] | Freq: Two times a day (BID) | OPHTHALMIC | Status: DC

## 2020-06-26 MED ORDER — PREDNISOLONE ACETATE 1 % OP SUSP: 1.0000 [drp] | Freq: Two times a day (BID) | OPHTHALMIC | Status: DC

## 2020-06-26 NOTE — TOC Transition Note (Signed)
Transition of Care [facility]) - CM/SW Discharge Note   Patient Details  Name: Jerome Mays MRN: [id number] Date of Birth: [birth year]  Transition of Care [facility]) CM/SW Contact:  Boneta Lucks, RN Phone Number: 06/26/2020, 11:55 AM   Clinical Narrative:   Patient is discharging home. Orders for Home Health RN/PT.  TOC spoke with wife. Requesting Advanced. Vaughan Basta accepted referral. Orders have been placed and added to AVS.     Final next level of care: [place] Barriers to Discharge: Barriers Resolved   Patient Goals and CMS Choice Patient states their goals for this hospitalization and ongoing recovery are:: to go home. CMS Medicare.gov Compare Post Acute Care list provided to:: Patient Choice offered to / list presented to : Patient  Discharge Placement              Patient chooses bed at:  [facility]) Patient to be transferred to facility by: [name]- Wife Name of family member notified: [name] Patient and family notified of of transfer: 06/26/20  Discharge Plan and Services      HH Arranged: RN,PT [facility] Agency: [place] ([place]) Date [place]: 06/26/20 Time [place]: 1134 Representative spoke with at [place]: Romualdo Bolk   Readmission Risk Interventions Readmission Risk Prevention Plan 06/26/2020  Post Dischage Appt Complete  Medication Screening Complete  Transportation Screening Complete  Some recent data might be hidden

## 2020-06-26 NOTE — Discharge Summary (Signed)
Physician Discharge Summary  Jerome Mays [id number] DOB: [birth year] DOA: 06/23/2020  PCP: [name], MD Urologist: Dr. Alyson Ingles  Admit date: 06/23/2020 Discharge date: 06/26/2020  Admitted From:  Home  Disposition:  Home   Recommendations for Outpatient Follow-up:  1. Follow up with Urologist in 1 weeks 2. Follow up with PCP in 2 weeks  Discharge Condition: STABLE   CODE STATUS: FULL    Brief Hospitalization Summary: Please see all hospital notes, images, labs for full details of the hospitalization. ADMISSION HPI:  Jerome Mays  is a 80 y.o. 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  Patient reports that the pain started last night.  It was gradual in onset and, constant but did wax and wane.  He reports associated urinary frequency, hematuria as well.  Patient denies any fever.  He does report that he fell down yesterday.  He describes it as a mechanical fall where his leg went out forwards and he fell backwards onto his left hip.  He reports stinging and burning pain.  He did not hit his head.  He had no preceding symptoms such as chest pain, palpitations, shortness of breath, dizziness.  He has not taken anything for pain at home even though he is prescribed oxycodone.  Patient presented to the ED today because the left flank pain is "almost too much to stand."  Patient was recently in the hospital when [place] stent was placed.  Summary of by Dr. Milford Cage - "Patient is a 80 year old white male with history of solitary left kidney. He was seen in the emergency room in [facility] on 05/29/2020 with flank pain and elevated serum creatinine. He was also noted to have evidence of pulmonary embolus during his evaluation in the emergency room at that  time. He was subsequent transferred down to [facility] emergency room on 05/30/2020 and underwent urgent placement of left JJ stent. Creatinine level normalized and he was discharged home with outpatient follow-up scheduled here on 06/17/2020. The patient was also discharged home on Eliquis for his pulmonary emboli. In the interim he has continued to have some hematuria and occasional passage of clots."  Patient did  follow-up with difficulty voiding, had a minimal postvoid residual, Foley placement was not done, cystoscopy, insertion left JJ stent was done on June 18, 2020.  In the ED Temperature 98.7, heart rate 90-98, respiratory rate 18-25, blood pressure 136/76 White blood cell count 12.2, hemoglobin 8.8 Chemistry panel is grossly normal with a BUN of 17 and a creatinine of 0.92 UA shows large hemoglobin, many bacteria, greater than 50 white blood cells Patient started on Rocephin urine culture ordered CT abdomen pelvis showed interval placement of left ureteral stent in appropriate position.  Couple of 2-4 mm ureterolithiases.  Interval worsening of mild left hydro ureteral nephrosis, periureteral fat stranding.  Status post right nephrectomy. CT lumbar spine showed no acute displaced fracture or traumatic listhesis of lumbar spine and patient with severe multilevel degenerative changes Admission requested for treatment of UTI, evaluation by urology in the a.m.  Hospital Course  UTI/left-sided pyelonephritis -In setting of recent JJ stent placement and revision -Treated with empiric Rocephin in hospital and will discharge on oral cefdinir to complete full 7 day course -urine culture:  INSIGNIFICANT GROWTH -Mild hydronephrosis and nephrolithiasis noted  on the outside, but stable creatinine. Discussed with urology Dr. Alyson Ingles with no need for intervention at this time.  May discharge home with oral antibiotics and follow-up with urology outpatient.  History of PE -Home Eliquis to  resumed -No further hematuria noted  Mild protein calorie malnutrition -Albumin 3.3 -Boost supplement  History of COPD -Continue Trelegy -Chronic hypoxemia and wears 2 L nasal cannula  History of ALS -Continue home medications -Has chronic weakness to the bilateral upper extremities  DVT prophylaxis:Eliquis Code Status:Full Family Communication:Discussed with wife, Jerome Mays at bedside 1/18 Disposition Plan: Home   Discharge Diagnoses:  Principal Problem:   UTI (urinary tract infection) Active Problems:   COPD, group B, by GOLD 2017 classification ([place])   Pulmonary emboli ([place])   Mild protein-calorie malnutrition ([place])  Discharge Instructions: Discharge Instructions    Ambulatory referral to Urology   Complete by: As directed      Allergies as of 06/26/2020      Reactions   Lipitor [atorvastatin] Rash, Other (See Comments)   Passed out      Medication List    STOP taking these medications   cetirizine 10 MG tablet Commonly known as: ZYRTEC     TAKE these medications   allopurinol 300 MG tablet Commonly known as: ZYLOPRIM Take 300 mg by mouth daily.   apixaban 5 MG Tabs tablet Commonly known as: ELIQUIS Take 1 tablet (5 mg total) by mouth 2 (two) times daily.   b complex vitamins tablet Take 1 tablet by mouth daily.   cefdinir 300 MG capsule Commonly known as: OMNICEF Take 2 capsules (600 mg total) by mouth daily for 5 days.   CeraVe Itch Relief 1 % Lotn Generic drug: pramoxine Apply 1 application topically 2 (two) times daily as needed (itching/dry skin.).   clobetasol 0.05 % external solution Commonly known as: TEMOVATE Apply 1 application topically daily as needed.   guaiFENesin 600 MG 12 hr tablet Commonly known as: MUCINEX Take 600 mg by mouth 2 (two) times daily.   ketoconazole 2 % shampoo Commonly known as: NIZORAL Apply 1 application topically every other day.   ketorolac 0.5 % ophthalmic solution Commonly known as:  ACULAR Place 1 drop into the left eye in the morning and at bedtime.   multivitamin with minerals Tabs tablet Take 1 tablet by mouth daily.   mupirocin ointment 2 % Commonly known as: BACTROBAN Apply 1 application topically daily.   NON FORMULARY   oxyCODONE-acetaminophen 5-325 MG tablet Commonly known as: Percocet Take 1 tablet by mouth every 6 (six) hours as needed for severe pain.   prednisoLONE acetate 1 % ophthalmic suspension Commonly known as: PRED FORTE Place 1 drop into the left eye in the morning and at bedtime.   riluzole 50 MG tablet Commonly known as: Rilutek Take 1 tablet (50 mg total) by mouth every 12 (twelve) hours.   sodium chloride 5 % ophthalmic solution Commonly known as: MURO 128 Place 1 drop into the left eye in the morning and at bedtime.   Trelegy Ellipta 200-62.5-25 MCG/INH Aepb Generic drug: Fluticasone-Umeclidin-Vilant INHALE 1 PUFF ONCE DAILY What changed: See the new instructions.   triamcinolone 0.1 % Commonly known as: KENALOG Apply 1 application topically 2 (two) times daily as needed (skin irritation.).   Vitamin D3 50 MCG (2000 UT) Tabs Take by mouth.       Follow-up Information    McKenzie, Candee Furbish, MD. Schedule an appointment as soon as possible for a visit in 1 week(s).  Specialty: Urology Why: Hospital Follow Up  Contact information: [address]  [place] [address] [phone]        [name], MD. Schedule an appointment as soon as possible for a visit in 2 week(s).   Specialty: Internal Medicine Contact information: [place] [address] [phone]              Allergies  Allergen Reactions  . Lipitor [Atorvastatin] Rash and Other (See Comments)    Passed out   Allergies as of 06/26/2020      Reactions   Lipitor [atorvastatin] Rash, Other (See Comments)   Passed out      Medication List    STOP taking these medications   cetirizine 10 MG  tablet Commonly known as: ZYRTEC     TAKE these medications   allopurinol 300 MG tablet Commonly known as: ZYLOPRIM Take 300 mg by mouth daily.   apixaban 5 MG Tabs tablet Commonly known as: ELIQUIS Take 1 tablet (5 mg total) by mouth 2 (two) times daily.   b complex vitamins tablet Take 1 tablet by mouth daily.   cefdinir 300 MG capsule Commonly known as: OMNICEF Take 2 capsules (600 mg total) by mouth daily for 5 days.   CeraVe Itch Relief 1 % Lotn Generic drug: pramoxine Apply 1 application topically 2 (two) times daily as needed (itching/dry skin.).   clobetasol 0.05 % external solution Commonly known as: TEMOVATE Apply 1 application topically daily as needed.   guaiFENesin 600 MG 12 hr tablet Commonly known as: MUCINEX Take 600 mg by mouth 2 (two) times daily.   ketoconazole 2 % shampoo Commonly known as: NIZORAL Apply 1 application topically every other day.   ketorolac 0.5 % ophthalmic solution Commonly known as: ACULAR Place 1 drop into the left eye in the morning and at bedtime.   multivitamin with minerals Tabs tablet Take 1 tablet by mouth daily.   mupirocin ointment 2 % Commonly known as: BACTROBAN Apply 1 application topically daily.   NON FORMULARY   oxyCODONE-acetaminophen 5-325 MG tablet Commonly known as: Percocet Take 1 tablet by mouth every 6 (six) hours as needed for severe pain.   prednisoLONE acetate 1 % ophthalmic suspension Commonly known as: PRED FORTE Place 1 drop into the left eye in the morning and at bedtime.   riluzole 50 MG tablet Commonly known as: Rilutek Take 1 tablet (50 mg total) by mouth every 12 (twelve) hours.   sodium chloride 5 % ophthalmic solution Commonly known as: MURO 128 Place 1 drop into the left eye in the morning and at bedtime.   Trelegy Ellipta 200-62.5-25 MCG/INH Aepb Generic drug: Fluticasone-Umeclidin-Vilant INHALE 1 PUFF ONCE DAILY What changed: See the new instructions.   triamcinolone 0.1  % Commonly known as: KENALOG Apply 1 application topically 2 (two) times daily as needed (skin irritation.).   Vitamin D3 50 MCG (2000 UT) Tabs Take by mouth.       Procedures/Studies: CT ABDOMEN PELVIS WO CONTRAST  Result Date: 06/23/2020 CLINICAL DATA:  Hematuria, unknown cause. Left-sided abdominal pain. Nausea and constipation. EXAM: CT ABDOMEN AND PELVIS WITHOUT CONTRAST CT LUMBAR SPINE without contrast TECHNIQUE: Multidetector CT imaging of the abdomen and pelvis was performed following the standard protocol without IV contrast. Multiplanar CT reconstructions of the lumbar spine. COMPARISON:  CT abdomen pelvis 05/29/2020 FINDINGS: Lower chest: Coronary artery calcifications. Bilateral lower lobe atelectasis again noted. Calcified granuloma at the right base. Hepatobiliary: No focal liver abnormality. No gallstones,  gallbladder wall thickening, or pericholecystic fluid. No biliary dilatation. Pancreas: No focal lesion. Normal pancreatic contour. No surrounding inflammatory changes. No main pancreatic ductal dilatation. Spleen: Normal in size without focal abnormality. Adrenals/Urinary Tract: No adrenal nodule bilaterally. Status post right nephrectomy. Interval placement of a left ureteral stent with proximal pigtail terminating within a an inferior pole calyx and distal pigtail terminating within the urinary bladder lumen. Interval development of Peri-ureteral fat stranding and interval worsening of mild hydroureteronephrosis on the left. Several nephrolithiasis (2:2:24, 27,30). Couple of 2-4 mm calcifications along the ureteral stent in the mid ureter (2:50). Previously noted proximal ureteral stones are no longer visualized. Couple of layering punctate calcifications within the lumen of the urinary bladder wall (2:74). Hypodensity within the inferior pole left kidney likely represents a simple renal cyst. The urinary bladder is otherwise unremarkable. Stomach/Bowel: Stomach is within normal  limits. No evidence of bowel wall thickening or dilatation. The appendix not definitely identified. Vascular/Lymphatic: No abdominal aorta or iliac aneurysm. Severe atherosclerotic plaque of the aorta and its branches. Scattered descending and sigmoid diverticulosis. No abdominal, pelvic, or inguinal lymphadenopathy. Reproductive: Penile pump implant with reservoir within the anterior lower abdomen. No inflammatory changes surrounding the reservoir noted on this noncontrast study. Other: No intraperitoneal free fluid. No intraperitoneal free gas. No organized fluid collection. Musculoskeletal: No abdominal wall hernia or abnormality. No suspicious lytic or blastic osseous lesions. No acute displaced fracture. CT LUMBAR SPINE: Segmentation: 5 non-rib-bearing lumbar vertebral bodies. Alignment: Normal. Vertebral body/inter vertebral spaces: Multilevel moderate severe degenerative change of the spine with moderate severe intervertebral disc space narrowing. No suspicious lytic or blastic osseous lesions. Redemonstration of a well-defined sclerotic lesion within the L3 vertebral body that likely represents a bone island. No severe osseous neural foraminal or central canal stenosis. Soft tissues: Unremarkable. IMPRESSION: 1. Interval placement of a left ureteral stent in grossly appropriate position. Couple associated 2 and 23mm ureterolithiasis within the mid left ureter. Interval slight worsening of mild left hydroureteronephrosis. Associated peri-ureteral fat stranding may be reactive versus could represent an infection with limited evaluation on this noncontrast study. Correlate with urinalysis for infection. 2. Status post right nephrectomy. 3. Colonic diverticulosis with no acute diverticulitis. 4. No acute displaced fracture or traumatic listhesis of the lumbar spine in a patient with multilevel severe degenerative changes. Electronically Signed   By: Iven Finn M.D.   On: 06/23/2020 15:56   CT Angio Chest  PE W/Cm &/Or Wo Cm  Result Date: 05/29/2020 CLINICAL DATA:  Shortness of breath and elevated D-dimer. Clinical suspicion for pulmonary embolism. EXAM: CT ANGIOGRAPHY CHEST WITH CONTRAST TECHNIQUE: Multidetector CT imaging of the chest was performed using the standard protocol during bolus administration of intravenous contrast. Multiplanar CT image reconstructions and MIPs were obtained to evaluate the vascular anatomy. CONTRAST:  35mL OMNIPAQUE IOHEXOL 350 MG/ML SOLN COMPARISON:  02/16/2020 FINDINGS: Cardiovascular: 2 small filling defects are seen within subsegmental pulmonary artery branches in the anterior right middle lobe (image 178/6), and in the posterior right lower lobe (image 195/6). These are new since previous study of 02/16/2020, and may be represent acute or subacute pulmonary emboli. No other sites of pulmonary embolism identified. No evidence of thoracic aortic dissection or aneurysm. Mediastinum/Nodes: Stable mild mediastinal and hilar lymphadenopathy with central calcifications, consistent with old granulomatous disease. No new or increased sites of lymphadenopathy identified. Lungs/Pleura: Moderate to severe centrilobular emphysema is seen. New focal ill-defined pulmonary opacity is seen in the anterior right lung apex, likely infectious or inflammatory etiology with  neoplasm considered less likely. Dependent atelectasis in both lower lobes has increased since prior exam. No evidence of pleural effusion. Upper abdomen: No acute findings. Musculoskeletal: No suspicious bone lesions identified. Review of the MIP images confirms the above findings. IMPRESSION: 2 small subsegmental pulmonary emboli in right middle and lower lobes which are new since 02/16/2020 exam, and may be acute or subacute in age. New focal ill-defined pulmonary opacity in anterior right lung apex, likely infectious or inflammatory etiology with neoplasm considered less likely. Recommend continued follow-up by CT in 2-3  months. Increased dependent atelectasis in both lower lobes. Stable mild mediastinal and hilar lymphadenopathy, consistent with old granulomatous disease. Emphysema (ICD10-J43.9). Electronically Signed   By: Marlaine Hind M.D.   On: 05/29/2020 20:49   CT Abdomen Pelvis W Contrast  Result Date: 05/29/2020 CLINICAL DATA:  Abdominal pain. Suprapubic abdominal pain. History of prior nephrectomy. EXAM: CT ABDOMEN AND PELVIS WITH CONTRAST TECHNIQUE: Multidetector CT imaging of the abdomen and pelvis was performed using the standard protocol following bolus administration of intravenous contrast. CONTRAST:  145mL OMNIPAQUE IOHEXOL 300 MG/ML  SOLN COMPARISON:  CT dated 07/14/2012 FINDINGS: Lower chest: There is atelectasis at the lung bases.The heart size appears to be mildly enlarged. There is suggestion of emphysematous changes at the lung bases. Hepatobiliary: The liver is normal. Normal gallbladder.There is no biliary ductal dilation. Pancreas: Normal contours without ductal dilatation. No peripancreatic fluid collection. Spleen: Unremarkable. Adrenals/Urinary Tract: --Adrenal glands: Unremarkable. --Right kidney/ureter: The right adrenal gland is surgically absent. --Left kidney/ureter: There is left-sided mild to moderate hydroureteronephrosis secondary to an obstructing set of stones in the proximal left ureter. The largest stone measures up to approximately 7 mm (axial series 2, image 46). There are additional punctate nonobstructing stones throughout the left kidney. There are multiple small cystic appearing lesions involving the left kidney some of these measure above water density and therefore are indeterminate on this study. --Urinary bladder: Multiple small bladder stones/gravel are noted in the patient's urinary bladder. The urinary bladder is mildly distended. Stomach/Bowel: --Stomach/Duodenum: No hiatal hernia or other gastric abnormality. Normal duodenal course and caliber. --Small bowel: Unremarkable.  --Colon: Rectosigmoid diverticulosis without acute inflammation. --Appendix: Not visualized. No right lower quadrant inflammation or free fluid. Vascular/Lymphatic: Atherosclerotic calcification is present within the non-aneurysmal abdominal aorta, without hemodynamically significant stenosis. --No retroperitoneal lymphadenopathy. --No mesenteric lymphadenopathy. --No pelvic or inguinal lymphadenopathy. Reproductive: There is a penile prosthesis in place. Other: No ascites or free air. The abdominal wall is normal. Musculoskeletal. No acute displaced fractures. IMPRESSION: 1. Left-sided mild to moderate hydroureteronephrosis secondary to an obstructing set of stones in the proximal left ureter. The largest stone measures up to approximately 7 mm. 2. Multiple small bladder stones/gravel are noted. 3. Rectosigmoid diverticulosis without acute inflammation. 4. There are multiple small cystic appearing lesions involving the left kidney some of these measure above water density and therefore are indeterminate on this study. Follow-up with a nonemergent outpatient renal ultrasound is recommended. 5. Status post right-sided nephrectomy. Aortic Atherosclerosis (ICD10-I70.0). Electronically Signed   By: Constance Holster M.D.   On: 05/29/2020 19:22   US RENAL  Result Date: 05/31/2020 CLINICAL DATA:  Left renal cyst. Status post right nephrectomy. Status post left ureteral stent placement EXAM: RENAL / URINARY TRACT ULTRASOUND COMPLETE COMPARISON:  CT dated May 29, 2020 FINDINGS: Right Kidney: The patient is status post right-sided nephrectomy. Left Kidney: Renal measurements: 13.8 x 6.8 x 5.9 cm = volume: 289 mL. There is no significant collecting system dilatation. A  small 1.1 cm cyst is noted arising from the lower pole. Bladder: There is layering debris within the urinary bladder. The left ureteral stent is identified. The left ureteral jet is noted. Other: None. IMPRESSION: 1. No definite left-sided  hydronephrosis. 2. There is debris within the urinary bladder which is nonspecific and should be correlated with urinalysis. 3. The left-sided ureteral stent is noted extending into the urinary bladder. 4. A simple appearing cyst is noted arising from the lower pole. No concerning renal lesion was identified on this study. 5. Status post right-sided nephrectomy. Electronically Signed   By: Constance Holster M.D.   On: 05/31/2020 17:33   CT L-SPINE NO CHARGE  Result Date: 06/23/2020 CLINICAL DATA:  Hematuria, unknown cause. Left-sided abdominal pain. Nausea and constipation. EXAM: CT ABDOMEN AND PELVIS WITHOUT CONTRAST CT LUMBAR SPINE without contrast TECHNIQUE: Multidetector CT imaging of the abdomen and pelvis was performed following the standard protocol without IV contrast. Multiplanar CT reconstructions of the lumbar spine. COMPARISON:  CT abdomen pelvis 05/29/2020 FINDINGS: Lower chest: Coronary artery calcifications. Bilateral lower lobe atelectasis again noted. Calcified granuloma at the right base. Hepatobiliary: No focal liver abnormality. No gallstones, gallbladder wall thickening, or pericholecystic fluid. No biliary dilatation. Pancreas: No focal lesion. Normal pancreatic contour. No surrounding inflammatory changes. No main pancreatic ductal dilatation. Spleen: Normal in size without focal abnormality. Adrenals/Urinary Tract: No adrenal nodule bilaterally. Status post right nephrectomy. Interval placement of a left ureteral stent with proximal pigtail terminating within a an inferior pole calyx and distal pigtail terminating within the urinary bladder lumen. Interval development of Peri-ureteral fat stranding and interval worsening of mild hydroureteronephrosis on the left. Several nephrolithiasis (2:2:24, 27,30). Couple of 2-4 mm calcifications along the ureteral stent in the mid ureter (2:50). Previously noted proximal ureteral stones are no longer visualized. Couple of layering punctate  calcifications within the lumen of the urinary bladder wall (2:74). Hypodensity within the inferior pole left kidney likely represents a simple renal cyst. The urinary bladder is otherwise unremarkable. Stomach/Bowel: Stomach is within normal limits. No evidence of bowel wall thickening or dilatation. The appendix not definitely identified. Vascular/Lymphatic: No abdominal aorta or iliac aneurysm. Severe atherosclerotic plaque of the aorta and its branches. Scattered descending and sigmoid diverticulosis. No abdominal, pelvic, or inguinal lymphadenopathy. Reproductive: Penile pump implant with reservoir within the anterior lower abdomen. No inflammatory changes surrounding the reservoir noted on this noncontrast study. Other: No intraperitoneal free fluid. No intraperitoneal free gas. No organized fluid collection. Musculoskeletal: No abdominal wall hernia or abnormality. No suspicious lytic or blastic osseous lesions. No acute displaced fracture. CT LUMBAR SPINE: Segmentation: 5 non-rib-bearing lumbar vertebral bodies. Alignment: Normal. Vertebral body/inter vertebral spaces: Multilevel moderate severe degenerative change of the spine with moderate severe intervertebral disc space narrowing. No suspicious lytic or blastic osseous lesions. Redemonstration of a well-defined sclerotic lesion within the L3 vertebral body that likely represents a bone island. No severe osseous neural foraminal or central canal stenosis. Soft tissues: Unremarkable. IMPRESSION: 1. Interval placement of a left ureteral stent in grossly appropriate position. Couple associated 2 and 39mm ureterolithiasis within the mid left ureter. Interval slight worsening of mild left hydroureteronephrosis. Associated peri-ureteral fat stranding may be reactive versus could represent an infection with limited evaluation on this noncontrast study. Correlate with urinalysis for infection. 2. Status post right nephrectomy. 3. Colonic diverticulosis with no  acute diverticulitis. 4. No acute displaced fracture or traumatic listhesis of the lumbar spine in a patient with multilevel severe degenerative changes. Electronically Signed  By: Iven Finn M.D.   On: 06/23/2020 15:56   DG Chest Portable 1 View  Result Date: 05/29/2020 CLINICAL DATA:  New oxygen requirement. EXAM: PORTABLE CHEST 1 VIEW COMPARISON:  Chest radiographs 02/13/2020 and CTA 02/16/2020 FINDINGS: Telemetry leads overlie the chest. The cardiomediastinal silhouette is unchanged with normal heart size. Aortic atherosclerosis is noted. Lung volumes are lower than on the prior study. Chronic interstitial coarsening is similar to the prior radiographs with emphysema shown on CT. There may be a trace left pleural effusion. No pneumothorax is identified. No acute osseous abnormality is seen. IMPRESSION: Chronic lung changes without evidence of pneumonia or edema. Possible trace left pleural effusion. Electronically Signed   By: Logan Bores M.D.   On: 05/29/2020 15:18   DG C-Arm 1-60 Min-No Report  Result Date: 06/18/2020 Fluoroscopy was utilized by the requesting physician.  No radiographic interpretation.   DG C-Arm 1-60 Min-No Report  Result Date: 05/30/2020 Fluoroscopy was utilized by the requesting physician.  No radiographic interpretation.   ECHOCARDIOGRAM COMPLETE  Result Date: 05/30/2020    ECHOCARDIOGRAM REPORT   Patient Name:   ADRIENNE WINDERS [facility] Date of Exam: 05/30/2020 Medical Rec #:  [id number]      Height:       73.0 in Accession #:    [id number]     Weight:       180.0 lb Date of Birth:  [birth year]     BSA:          2.057 m Patient Age:    60 years       BP:           128/77 mmHg Patient Gender: M              HR:           73 bpm. Exam Location:  [facility] Procedure: 2D Echo, Cardiac Doppler and Color Doppler Indications:    Pulmonary Embolus 415.19 / I26.99  History:        Patient has no prior history of Echocardiogram examinations.                 COPD; Risk  Factors:Dyslipidemia. ALS (amyotrophic lateral                 sclerosis).  Sonographer:    Jerome Chapel RCS Referring Phys: [id number] [place]  1. Left ventricular ejection fraction, by estimation, is 60 to 65%. The left ventricle has normal function. The left ventricle has no regional wall motion abnormalities. Left ventricular diastolic parameters are indeterminate.  2. Right ventricular systolic function is normal. The right ventricular size is mildly enlarged. Tricuspid regurgitation signal is inadequate for assessing PA pressure.  3. A small pericardial effusion is present. The pericardial effusion is anterior to the right ventricle.  4. The mitral valve is grossly normal. Trivial mitral valve regurgitation. Moderate mitral annular calcification.  5. The aortic valve is tricuspid. Aortic valve regurgitation is trivial. Mild to moderate aortic valve sclerosis/calcification is present, without any evidence of aortic stenosis.  6. The inferior vena cava is normal in size with <50% respiratory variability, suggesting right atrial pressure of 8 mmHg. FINDINGS  Left Ventricle: Left ventricular ejection fraction, by estimation, is 60 to 65%. The left ventricle has normal function. The left ventricle has no regional wall motion abnormalities. The left ventricular internal cavity size was normal in size. There is  borderline left ventricular hypertrophy. Left ventricular diastolic parameters are indeterminate. Right Ventricle: The right  ventricular size is mildly enlarged. No increase in right ventricular wall thickness. Right ventricular systolic function is normal. Tricuspid regurgitation signal is inadequate for assessing PA pressure. Left Atrium: Left atrial size was normal in size. Right Atrium: Right atrial size was normal in size. Pericardium: A small pericardial effusion is present. The pericardial effusion is anterior to the right ventricle. Mitral Valve: The mitral valve is grossly  normal. Moderate mitral annular calcification. Trivial mitral valve regurgitation. Tricuspid Valve: The tricuspid valve is grossly normal. Tricuspid valve regurgitation is trivial. Aortic Valve: The aortic valve is tricuspid. There is mild aortic valve annular calcification. Aortic valve regurgitation is trivial. Mild to moderate aortic valve sclerosis/calcification is present, without any evidence of aortic stenosis. Pulmonic Valve: The pulmonic valve was not well visualized. Pulmonic valve regurgitation is trivial. Aorta: The aortic root is normal in size and structure. Venous: The inferior vena cava is normal in size with less than 50% respiratory variability, suggesting right atrial pressure of 8 mmHg. IAS/Shunts: No atrial level shunt detected by color flow Doppler.  LEFT VENTRICLE PLAX 2D LVIDd:         4.80 cm  Diastology LVIDs:         2.70 cm  LV e' medial:    6.09 cm/s LV PW:         0.95 cm  LV E/e' medial:  8.2 LV IVS:        0.95 cm  LV e' lateral:   6.53 cm/s LVOT diam:     1.80 cm  LV E/e' lateral: 7.7 LV SV:         59 LV SV Index:   29 LVOT Area:     2.54 cm  RIGHT VENTRICLE RV S prime:     13.50 cm/s TAPSE (M-mode): 2.1 cm LEFT ATRIUM             Index       RIGHT ATRIUM           Index LA diam:        3.70 cm 1.80 cm/m  RA Area:     16.20 cm LA Vol (A2C):   71.9 ml 34.95 ml/m RA Volume:   37.80 ml  18.37 ml/m LA Vol (A4C):   56.1 ml 27.27 ml/m LA Biplane Vol: 67.0 ml 32.57 ml/m  AORTIC VALVE LVOT Vmax:   108.00 cm/s LVOT Vmean:  74.300 cm/s LVOT VTI:    0.233 m  AORTA Ao Root diam: 3.50 cm MITRAL VALVE MV Area (PHT): 1.76 cm    SHUNTS MV Decel Time: 430 msec    Systemic VTI:  0.23 m MV E velocity: 50.10 cm/s  Systemic Diam: 1.80 cm MV A velocity: 99.40 cm/s MV E/A ratio:  0.50 Rozann Lesches MD Electronically signed by Rozann Lesches MD Signature Date/Time: 05/30/2020/1:12:49 PM    Final       Subjective: Pt reports feeling much better, sitting up in chair today, no complaints, no  hematuria   Discharge Exam: Vitals:   06/26/20 0602 06/26/20 0758  BP: 131/78   Pulse: 79   Resp: 19   Temp: 98.1 F (36.7 C)   SpO2: 95% 94%   Vitals:   06/25/20 1405 06/25/20 2136 06/26/20 0602 06/26/20 0758  BP: 104/64 112/69 131/78   Pulse: 85 79 79   Resp: 18 20 19    Temp: 98.6 F (37 C) 98.6 F (37 C) 98.1 F (36.7 C)   TempSrc: Oral Oral Temporal   SpO2: 97% 97%  95% 94%  Weight:      Height:       General: Pt is alert, awake, not in acute distress Cardiovascular: RRR, S1/S2 +, no rubs, no gallops Respiratory: CTA bilaterally, no wheezing, no rhonchi Abdominal: Soft, NT, ND, bowel sounds + Extremities: no edema, no cyanosis   The results of significant diagnostics from this hospitalization (including imaging, microbiology, ancillary and laboratory) are listed below for reference.     Microbiology: Recent Results (from the past 240 hour(s))  SARS CORONAVIRUS 2 (TAT 6-24 HRS) Nasopharyngeal Nasopharyngeal Swab     Status: None   Collection Time: 06/17/20 12:01 PM   Specimen: Nasopharyngeal Swab  Result Value Ref Range Status   SARS Coronavirus 2 NEGATIVE NEGATIVE Final    Comment: (NOTE) SARS-CoV-2 target nucleic acids are NOT DETECTED.  The SARS-CoV-2 RNA is generally detectable in upper and lower respiratory specimens during the acute phase of infection. Negative results do not preclude SARS-CoV-2 infection, do not rule out co-infections with other pathogens, and should not be used as the sole basis for treatment or other patient management decisions. Negative results must be combined with clinical observations, patient history, and epidemiological information. The expected result is Negative.  Fact Sheet for Patients: SugarRoll.be  Fact Sheet for Healthcare Providers: https://www.woods-mathews.com/  This test is not yet approved or cleared by the [place] FDA and  has been authorized for detection and/or  diagnosis of SARS-CoV-2 by FDA under an Emergency Use Authorization (EUA). This EUA will remain  in effect (meaning this test can be used) for the duration of the COVID-19 declaration under Se ction 564(b)(1) of the Act, 21 U.S.C. section 360bbb-3(b)(1), unless the authorization is terminated or revoked sooner.  Performed at [facility], [place] [address] [place], [place] [address]   Urine Culture     Status: Abnormal   Collection Time: 06/23/20  3:51 PM   Specimen: Urine, Clean Catch  Result Value Ref Range Status   Specimen Description   Final    URINE, CLEAN CATCH Performed at [facility], [address], [place], [place] [address]    Special Requests   Final    NONE Performed at [facility], [address] [place], [place] [address]    Culture MULTIPLE SPECIES PRESENT, SUGGEST RECOLLECTION (A)  Final   Report Status 06/25/2020 FINAL  Final  Resp Panel by RT-PCR (Flu A&B, Covid) Nasopharyngeal Swab     Status: None   Collection Time: 06/23/20  5:01 PM   Specimen: Nasopharyngeal Swab; Nasopharyngeal(NP) swabs in vial transport medium  Result Value Ref Range Status   SARS Coronavirus 2 by RT PCR NEGATIVE NEGATIVE Final    Comment: (NOTE) SARS-CoV-2 target nucleic acids are NOT DETECTED.  The SARS-CoV-2 RNA is generally detectable in upper respiratory specimens during the acute phase of infection. The lowest concentration of SARS-CoV-2 viral copies this assay can detect is 138 copies/mL. A negative result does not preclude SARS-Cov-2 infection and should not be used as the sole basis for treatment or other patient management decisions. A negative result may occur with  improper specimen collection/handling, submission of specimen other than nasopharyngeal swab, presence of viral mutation(s) within the areas targeted by this assay, and inadequate number of viral copies(<138 copies/mL). A negative result must be combined with clinical observations, patient history, and  epidemiological information. The expected result is Negative.  Fact Sheet for Patients:  EntrepreneurPulse.com.au  Fact Sheet for Healthcare Providers:  IncredibleEmployment.be  This test is no t yet  approved or cleared by the [place] and  has been authorized for detection and/or diagnosis of SARS-CoV-2 by FDA under an Emergency Use Authorization (EUA). This EUA will remain  in effect (meaning this test can be used) for the duration of the COVID-19 declaration under Section 564(b)(1) of the Act, 21 U.S.C.section 360bbb-3(b)(1), unless the authorization is terminated  or revoked sooner.       Influenza A by PCR NEGATIVE NEGATIVE Final   Influenza B by PCR NEGATIVE NEGATIVE Final    Comment: (NOTE) The Xpert Xpress SARS-CoV-2/FLU/RSV plus assay is intended as an aid in the diagnosis of influenza from Nasopharyngeal swab specimens and should not be used as a sole basis for treatment. Nasal washings and aspirates are unacceptable for Xpert Xpress SARS-CoV-2/FLU/RSV testing.  Fact Sheet for Patients: EntrepreneurPulse.com.au  Fact Sheet for Healthcare Providers: IncredibleEmployment.be  This test is not yet approved or cleared by the [place] FDA and has been authorized for detection and/or diagnosis of SARS-CoV-2 by FDA under an Emergency Use Authorization (EUA). This EUA will remain in effect (meaning this test can be used) for the duration of the COVID-19 declaration under Section 564(b)(1) of the Act, 21 U.S.C. section 360bbb-3(b)(1), unless the authorization is terminated or revoked.  Performed at [facility], [address], [place], [place] [address]      Labs: BNP (last 3 results) Recent Labs    05/29/20 1640  BNP 0000000   Basic Metabolic Panel: Recent Labs  Lab 06/23/20 1520 06/24/20 0430 06/25/20 0622 06/26/20 0431  NA 136 140 138 139  K 4.1 4.1 3.9 3.8  CL 103 109 105  106  CO2 24 27 25 26   GLUCOSE 136* 110* 105* 96  BUN 17 15 17 19   CREATININE 0.92 0.77 0.80 0.80  CALCIUM 9.4 9.0 9.1 9.0  MG  --  2.3 2.1 2.1   Liver Function Tests: Recent Labs  Lab 06/23/20 1520 06/24/20 0430  AST 22 21  ALT 16 12  ALKPHOS 67 59  BILITOT 0.8 0.5  PROT 6.3* 5.8*  ALBUMIN 3.3* 2.8*   No results for input(s): LIPASE, AMYLASE in the last 168 hours. No results for input(s): AMMONIA in the last 168 hours. CBC: Recent Labs  Lab 06/23/20 1520 06/24/20 0430 06/25/20 0622 06/26/20 0431  WBC 12.2* 7.2 7.0 6.5  NEUTROABS 10.8* 5.3  --   --   HGB 8.8* 8.0* 8.4* 8.3*  HCT 29.9* 27.1* 28.6* 28.3*  MCV 91.7 90.0 90.8 90.7  PLT 221 217 229 218   Cardiac Enzymes: No results for input(s): CKTOTAL, CKMB, CKMBINDEX, TROPONINI in the last 168 hours. BNP: Invalid input(s): POCBNP CBG: No results for input(s): GLUCAP in the last 168 hours. D-Dimer No results for input(s): DDIMER in the last 72 hours. Hgb A1c No results for input(s): HGBA1C in the last 72 hours. Lipid Profile No results for input(s): CHOL, HDL, LDLCALC, TRIG, CHOLHDL, LDLDIRECT in the last 72 hours. Thyroid function studies No results for input(s): TSH, T4TOTAL, T3FREE, THYROIDAB in the last 72 hours.  Invalid input(s): FREET3 Anemia work up No results for input(s): VITAMINB12, FOLATE, FERRITIN, TIBC, IRON, RETICCTPCT in the last 72 hours. Urinalysis    Component Value Date/Time   COLORURINE RED (A) 06/23/2020 1551   APPEARANCEUR CLOUDY (A) 06/23/2020 1551   LABSPEC 1.013 06/23/2020 1551   PHURINE 7.0 06/23/2020 1551   GLUCOSEU NEGATIVE 06/23/2020 1551   HGBUR LARGE (A) 06/23/2020 1551   BILIRUBINUR NEGATIVE 06/23/2020 1551   KETONESUR NEGATIVE  06/23/2020 1551   PROTEINUR 100 (A) 06/23/2020 1551   NITRITE NEGATIVE 06/23/2020 1551   LEUKOCYTESUR MODERATE (A) 06/23/2020 1551   Sepsis Labs Invalid input(s): PROCALCITONIN,  WBC,  LACTICIDVEN Microbiology Recent Results (from the past 240  hour(s))  SARS CORONAVIRUS 2 (TAT 6-24 HRS) Nasopharyngeal Nasopharyngeal Swab     Status: None   Collection Time: 06/17/20 12:01 PM   Specimen: Nasopharyngeal Swab  Result Value Ref Range Status   SARS Coronavirus 2 NEGATIVE NEGATIVE Final    Comment: (NOTE) SARS-CoV-2 target nucleic acids are NOT DETECTED.  The SARS-CoV-2 RNA is generally detectable in upper and lower respiratory specimens during the acute phase of infection. Negative results do not preclude SARS-CoV-2 infection, do not rule out co-infections with other pathogens, and should not be used as the sole basis for treatment or other patient management decisions. Negative results must be combined with clinical observations, patient history, and epidemiological information. The expected result is Negative.  Fact Sheet for Patients: SugarRoll.be  Fact Sheet for Healthcare Providers: https://www.woods-mathews.com/  This test is not yet approved or cleared by the [place] FDA and  has been authorized for detection and/or diagnosis of SARS-CoV-2 by FDA under an Emergency Use Authorization (EUA). This EUA will remain  in effect (meaning this test can be used) for the duration of the COVID-19 declaration under Se ction 564(b)(1) of the Act, 21 U.S.C. section 360bbb-3(b)(1), unless the authorization is terminated or revoked sooner.  Performed at [facility], [place] [address], [place], [place] [address]   Urine Culture     Status: Abnormal   Collection Time: 06/23/20  3:51 PM   Specimen: Urine, Clean Catch  Result Value Ref Range Status   Specimen Description   Final    URINE, CLEAN CATCH Performed at [facility], [address], [place], [place] [address]    Special Requests   Final    NONE Performed at [facility], [address], [place], [place] [address]    Culture MULTIPLE SPECIES PRESENT, SUGGEST RECOLLECTION (A)  Final   Report Status 06/25/2020 FINAL  Final   Resp Panel by RT-PCR (Flu A&B, Covid) Nasopharyngeal Swab     Status: None   Collection Time: 06/23/20  5:01 PM   Specimen: Nasopharyngeal Swab; Nasopharyngeal(NP) swabs in vial transport medium  Result Value Ref Range Status   SARS Coronavirus 2 by RT PCR NEGATIVE NEGATIVE Final    Comment: (NOTE) SARS-CoV-2 target nucleic acids are NOT DETECTED.  The SARS-CoV-2 RNA is generally detectable in upper respiratory specimens during the acute phase of infection. The lowest concentration of SARS-CoV-2 viral copies this assay can detect is 138 copies/mL. A negative result does not preclude SARS-Cov-2 infection and should not be used as the sole basis for treatment or other patient management decisions. A negative result may occur with  improper specimen collection/handling, submission of specimen other than nasopharyngeal swab, presence of viral mutation(s) within the areas targeted by this assay, and inadequate number of viral copies(<138 copies/mL). A negative result must be combined with clinical observations, patient history, and epidemiological information. The expected result is Negative.  Fact Sheet for Patients:  EntrepreneurPulse.com.au  Fact Sheet for Healthcare Providers:  IncredibleEmployment.be  This test is no t yet approved or cleared by the [place] FDA and  has been authorized for detection and/or diagnosis of SARS-CoV-2 by FDA under an Emergency Use Authorization (EUA). This EUA will remain  in effect (meaning this test can be used) for the duration of  the COVID-19 declaration under Section 564(b)(1) of the Act, 21 U.S.C.section 360bbb-3(b)(1), unless the authorization is terminated  or revoked sooner.       Influenza A by PCR NEGATIVE NEGATIVE Final   Influenza B by PCR NEGATIVE NEGATIVE Final    Comment: (NOTE) The Xpert Xpress SARS-CoV-2/FLU/RSV plus assay is intended as an aid in the diagnosis of influenza from  Nasopharyngeal swab specimens and should not be used as a sole basis for treatment. Nasal washings and aspirates are unacceptable for Xpert Xpress SARS-CoV-2/FLU/RSV testing.  Fact Sheet for Patients: EntrepreneurPulse.com.au  Fact Sheet for Healthcare Providers: IncredibleEmployment.be  This test is not yet approved or cleared by the [place] FDA and has been authorized for detection and/or diagnosis of SARS-CoV-2 by FDA under an Emergency Use Authorization (EUA). This EUA will remain in effect (meaning this test can be used) for the duration of the COVID-19 declaration under Section 564(b)(1) of the Act, 21 U.S.C. section 360bbb-3(b)(1), unless the authorization is terminated or revoked.  Performed at [facility], [address], [place], [place] [address]    Time coordinating discharge: 35 mins  SIGNED:  Irwin Brakeman, MD  [facility] 06/26/2020, 11:28 AM How to contact the [facility] Attending or Consulting provider [place] or covering provider during after hours [place], for this patient?  1. Check the care team in [facility] and look for a) attending/consulting TR[facility] provider listed and b) the [facility] team listed 2. Log into [email or web address] and use [place]'s universal password to access. If you do not have the password, please contact the hospital operator. 3. Locate the [facility] provider you are looking for under [facility] and page to a number that you can be directly reached. 4. If you still have difficulty reaching the provider, please page the [facility] (Director on Call) for the Hospitalists listed on amion for assistance.

## 2020-06-26 NOTE — Discharge Instructions (Signed)
Please follow up with Dr. Alyson Ingles as soon as able in the next 1-2 weeks.  IMPORTANT INFORMATION: PAY CLOSE ATTENTION   PHYSICIAN DISCHARGE INSTRUCTIONS  Follow with Primary care provider  [name], MD  and other consultants as instructed by your Hospitalist Physician  [place] IF SYMPTOMS COME BACK, WORSEN OR NEW PROBLEM DEVELOPS   Please note: You were cared for by a hospitalist during your hospital stay. Every effort will be made to forward records to your primary care provider.  You can request that your primary care provider send for your hospital records if they have not received them.  Once you are discharged, your primary care physician will handle any further medical issues. Please note that NO REFILLS for any discharge medications will be authorized once you are discharged, as it is imperative that you return to your primary care physician (or establish a relationship with a primary care physician if you do not have one) for your post hospital discharge needs so that they can reassess your need for medications and monitor your lab values.  Please get a complete blood count and chemistry panel checked by your Primary MD at your next visit, and again as instructed by your Primary MD.  Get Medicines reviewed and adjusted: Please take all your medications with you for your next visit with your Primary MD  Laboratory/radiological data: Please request your Primary MD to go over all hospital tests and procedure/radiological results at the follow up, please ask your primary care provider to get all Hospital records sent to his/her office.  In some cases, they will be blood work, cultures and biopsy results pending at the time of your discharge. Please request that your primary care provider follow up on these results.  If you are diabetic, please bring your blood sugar readings with you to your follow up appointment with primary care.    Please  call and make your follow up appointments as soon as possible.    Also Note the following: If you experience worsening of your admission symptoms, develop shortness of breath, life threatening emergency, suicidal or homicidal thoughts you must seek medical attention immediately by calling 911 or calling your MD immediately  if symptoms less severe.  You must read complete instructions/literature along with all the possible adverse reactions/side effects for all the Medicines you take and that have been prescribed to you. Take any new Medicines after you have completely understood and accpet all the possible adverse reactions/side effects.   Do not drive when taking Pain medications or sleeping medications (Benzodiazepines)  Do not take more than prescribed Pain, Sleep and Anxiety Medications. It is not advisable to combine anxiety,sleep and pain medications without talking with your primary care practitioner  Special Instructions: If you have smoked or chewed Tobacco  in the last 2 yrs please stop smoking, stop any regular Alcohol  and or any Recreational drug use.  Wear Seat belts while driving.  Do not drive if taking any narcotic, mind altering or controlled substances or recreational drugs or alcohol.

## 2020-06-27 DIAGNOSIS — N201 Calculus of ureter: Secondary | ICD-10-CM | POA: Diagnosis not present

## 2020-06-28 ENCOUNTER — Other Ambulatory Visit: Payer: Self-pay | Admitting: *Deleted

## 2020-06-28 ENCOUNTER — Ambulatory Visit (HOSPITAL_COMMUNITY): Payer: Medicare HMO | Admitting: Hematology and Oncology

## 2020-06-28 DIAGNOSIS — R918 Other nonspecific abnormal finding of lung field: Secondary | ICD-10-CM

## 2020-06-28 DIAGNOSIS — N136 Pyonephrosis: Secondary | ICD-10-CM | POA: Diagnosis not present

## 2020-06-28 DIAGNOSIS — M6250 Muscle wasting and atrophy, not elsewhere classified, unspecified site: Secondary | ICD-10-CM | POA: Diagnosis not present

## 2020-06-28 DIAGNOSIS — J449 Chronic obstructive pulmonary disease, unspecified: Secondary | ICD-10-CM | POA: Diagnosis not present

## 2020-06-28 DIAGNOSIS — G1221 Amyotrophic lateral sclerosis: Secondary | ICD-10-CM | POA: Diagnosis not present

## 2020-06-28 DIAGNOSIS — I2699 Other pulmonary embolism without acute cor pulmonale: Secondary | ICD-10-CM | POA: Diagnosis not present

## 2020-06-28 DIAGNOSIS — E441 Mild protein-calorie malnutrition: Secondary | ICD-10-CM | POA: Diagnosis not present

## 2020-06-28 DIAGNOSIS — M109 Gout, unspecified: Secondary | ICD-10-CM | POA: Diagnosis not present

## 2020-06-28 DIAGNOSIS — I2510 Atherosclerotic heart disease of native coronary artery without angina pectoris: Secondary | ICD-10-CM | POA: Diagnosis not present

## 2020-06-28 DIAGNOSIS — T83592A Infection and inflammatory reaction due to indwelling ureteral stent, initial encounter: Secondary | ICD-10-CM | POA: Diagnosis not present

## 2020-06-28 DIAGNOSIS — E7800 Pure hypercholesterolemia, unspecified: Secondary | ICD-10-CM | POA: Diagnosis not present

## 2020-06-28 NOTE — Patient Outreach (Signed)
[facility]) Care Management  06/28/2020  Jerome Mays 02/21/1941 [id number]   Noted that member discharged from hospital on 1/19 after being readmitted for UTI.  Call placed to wife/caregiver, state member is doing much better.  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  Able to void without problems, no bleeding.  Will have follow up with urology on 1/26, plan is to remove stent at that time.  Will also have a televisit with PCP on 1/24.  Home health remains involved, PT will visit today.  They continue to have support of adult daughters that are in the area.  Denies any urgent concerns, encouraged to contact this care manager with questions.  Agrees to follow up within the next month.  Goals Addressed            This Visit's Progress   . [facility]) Learn More About My Health   On track    Timeframe:  Long-Range Goal Priority:  Medium Start Date: 06/10/2020                            Expected End Date:  10/05/20                      Follow Up Date 06/24/2020    - tell my story and reason for my visit - make a list of questions - ask questions - repeat what I heard to make sure I understand - bring a list of my medicines to the visit - speak up when I don't understand  -continue to monitor urine for hematuria and notify provider for change or worsening -continue to monitor for shortness of breath or hemoptysis    Why is this important?    The best way to learn about your health and care is by talking to the doctor and nurse.   They will answer your questions and give you information in the way that you like best.    Notes:     . [facility]) Make and Keep All Appointments   On track    Timeframe:  Short-Term Goal Priority:  High Start Date:  06/10/20                           Expected End Date:  07/08/20                     Follow Up Date 06/24/20    - ask family or friend for a ride - call to cancel if needed - keep a calendar  with appointment dates  -request assistance from family for transportation to out of town appointments -schedule post hospital follow up with PCP -attend scheduled post hospital appointment with urologist on 06/17/2020 at 2:45   Why is this important?    Part of staying healthy is seeing the doctor for follow-up care.   If you forget your appointments, there are some things you can do to stay on track.    Notes:       Valente David, RN, MSN [place] [phone]

## 2020-07-01 DIAGNOSIS — J449 Chronic obstructive pulmonary disease, unspecified: Secondary | ICD-10-CM | POA: Diagnosis not present

## 2020-07-01 DIAGNOSIS — R918 Other nonspecific abnormal finding of lung field: Secondary | ICD-10-CM | POA: Diagnosis not present

## 2020-07-01 DIAGNOSIS — N390 Urinary tract infection, site not specified: Secondary | ICD-10-CM | POA: Diagnosis not present

## 2020-07-01 DIAGNOSIS — E441 Mild protein-calorie malnutrition: Secondary | ICD-10-CM | POA: Diagnosis not present

## 2020-07-01 DIAGNOSIS — N281 Cyst of kidney, acquired: Secondary | ICD-10-CM | POA: Diagnosis not present

## 2020-07-01 DIAGNOSIS — I2699 Other pulmonary embolism without acute cor pulmonale: Secondary | ICD-10-CM | POA: Diagnosis not present

## 2020-07-01 DIAGNOSIS — G1221 Amyotrophic lateral sclerosis: Secondary | ICD-10-CM | POA: Diagnosis not present

## 2020-07-02 ENCOUNTER — Other Ambulatory Visit: Payer: Self-pay | Admitting: Family Medicine

## 2020-07-02 DIAGNOSIS — E7800 Pure hypercholesterolemia, unspecified: Secondary | ICD-10-CM | POA: Diagnosis not present

## 2020-07-02 DIAGNOSIS — M6250 Muscle wasting and atrophy, not elsewhere classified, unspecified site: Secondary | ICD-10-CM | POA: Diagnosis not present

## 2020-07-02 DIAGNOSIS — I2699 Other pulmonary embolism without acute cor pulmonale: Secondary | ICD-10-CM | POA: Diagnosis not present

## 2020-07-02 DIAGNOSIS — G1221 Amyotrophic lateral sclerosis: Secondary | ICD-10-CM | POA: Diagnosis not present

## 2020-07-02 DIAGNOSIS — J449 Chronic obstructive pulmonary disease, unspecified: Secondary | ICD-10-CM | POA: Diagnosis not present

## 2020-07-02 DIAGNOSIS — I2510 Atherosclerotic heart disease of native coronary artery without angina pectoris: Secondary | ICD-10-CM | POA: Diagnosis not present

## 2020-07-02 DIAGNOSIS — T83592A Infection and inflammatory reaction due to indwelling ureteral stent, initial encounter: Secondary | ICD-10-CM | POA: Diagnosis not present

## 2020-07-02 DIAGNOSIS — N281 Cyst of kidney, acquired: Secondary | ICD-10-CM

## 2020-07-02 DIAGNOSIS — J9601 Acute respiratory failure with hypoxia: Secondary | ICD-10-CM | POA: Diagnosis not present

## 2020-07-02 DIAGNOSIS — N136 Pyonephrosis: Secondary | ICD-10-CM | POA: Diagnosis not present

## 2020-07-02 DIAGNOSIS — M109 Gout, unspecified: Secondary | ICD-10-CM | POA: Diagnosis not present

## 2020-07-02 DIAGNOSIS — E441 Mild protein-calorie malnutrition: Secondary | ICD-10-CM | POA: Diagnosis not present

## 2020-07-03 ENCOUNTER — Other Ambulatory Visit: Payer: Self-pay

## 2020-07-03 ENCOUNTER — Encounter: Payer: Self-pay | Admitting: Urology

## 2020-07-03 ENCOUNTER — Ambulatory Visit (INDEPENDENT_AMBULATORY_CARE_PROVIDER_SITE_OTHER): Payer: Medicare HMO | Admitting: Urology

## 2020-07-03 VITALS — BP 105/64 | HR 94 | Temp 98.1°F | Ht 73.0 in | Wt 178.0 lb

## 2020-07-03 DIAGNOSIS — N3001 Acute cystitis with hematuria: Secondary | ICD-10-CM

## 2020-07-03 DIAGNOSIS — N200 Calculus of kidney: Secondary | ICD-10-CM | POA: Diagnosis not present

## 2020-07-03 LAB — MICROSCOPIC EXAMINATION
RBC, Urine: >30 /hpf — AB (ref 0–2)
Renal Epithel, UA: NONE SEEN /hpf
WBC, UA: >30 /hpf — AB (ref 0–5)

## 2020-07-03 LAB — URINALYSIS, ROUTINE W REFLEX MICROSCOPIC
Bilirubin, UA: POSITIVE — AB
Nitrite, UA: POSITIVE — AB
Specific Gravity, UA: 1.01 (ref 1.005–1.030)
Urobilinogen, Ur: >8 mg/dL — ABNORMAL HIGH (ref 0.2–1.0)
pH, UA: 8.5 — ABNORMAL HIGH (ref 5.0–7.5)

## 2020-07-03 MED ORDER — NITROFURANTOIN MONOHYD MACRO 100 MG PO CAPS: 100.0000 mg | ORAL_CAPSULE | Freq: Two times a day (BID) | ORAL | 0 refills | Status: DC

## 2020-07-03 NOTE — Progress Notes (Signed)
Urological Symptom Review  Patient is experiencing the following symptoms: Frequent urination Hard to postpone urination Burning/pain with urination Get up at night to urinate Stream starts and stops Trouble starting stream Blood in urine Injury to kidneys/bladder   Review of Systems  Gastrointestinal (upper)  : Negative for upper GI symptoms  Gastrointestinal (lower) : Negative for lower GI symptoms  Constitutional : Negative for symptoms  Skin: Itching  Eyes: Negative for eye symptoms  Ear/Nose/Throat : Negative for Ear/Nose/Throat symptoms  Hematologic/Lymphatic: Negative for Hematologic/Lymphatic symptoms  Cardiovascular : Negative for cardiovascular symptoms  Respiratory : Negative for respiratory symptoms  Endocrine: Negative for endocrine symptoms  Musculoskeletal: Negative for musculoskeletal symptoms  Neurological: Negative for neurological symptoms  Psychologic: Negative for psychiatric symptoms

## 2020-07-03 NOTE — Progress Notes (Signed)
07/03/2020 11:04 AM   Jerome Mays [birth year] [id number]  Referring provider: [name], MD [id number] [place],  [place] [id number]  followup nephrolithiasis  HPI: [name] is a 80yo here for followup for nephrolithiasis. He is here for stent removal. He is having severe LUTS. He has dysuria, urinary frequency, urgency and gross hematuria. UA is concerning for infection   PMH: Past Medical History:  Diagnosis Date  . ALS (amyotrophic lateral sclerosis) ([place]) 2021  . Anemia    low iron  . Arthritis   . Atypical chest pain 01/04/2020  . Colonic polyp   . Constipation   . COPD (chronic obstructive pulmonary disease) ([place])   . Coronary artery calcification seen on CAT scan 01/04/2020  . GERD (gastroesophageal reflux disease)   . Gout   . History of kidney stones   . Kidney failure   . 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 Muscle atrophy   . Pain    LOWER BACK AND LEFT HIP - HX OF PREVIOUS LUMBAR AND CERVICAL SURGERY--PT STATES HE HAS HAD NUMBNESS IN BOTH FEET SINCE HIS BACK SURGERY  . Pure hypercholesterolemia 01/04/2020  . Renal calculus   . Retinal detachment   . Shortness of breath 01/04/2020    Surgical History: Past Surgical History:  Procedure Laterality Date  . BACK SURGERY     LUMBAR SURGERY  . CATARACT EXTRACTION Bilateral 2003   Surgeon unknown  . CYSTOSCOPY W/ URETERAL STENT PLACEMENT Left 05/30/2020   Procedure: CYSTOSCOPY URETERAL STENT PLACEMENT;  Surgeon: Remi Haggard, MD;  Location: [facility];  Service: Urology;  Laterality: Left;  . CYSTOSCOPY/URETEROSCOPY/HOLMIUM LASER/STENT PLACEMENT Left 06/18/2020   Procedure: CYSTOSCOPY/URETEROSCOPY/HOLMIUM LASER/STENT EXCHANGE;  Surgeon: Remi Haggard, MD;  Location: [facility];  Service: Urology;  Laterality: Left;  1 HR  . EYE SURGERY     BILATERAL CATARACT EXTRACTIONS  . GIVENS CAPSULE STUDY N/A 10/07/2015   Procedure: GIVENS CAPSULE STUDY;  Surgeon: Carol Ada, MD;  Location: [facility];   Service: Endoscopy;  Laterality: N/A;  . HAND SURGERY     left  . KNEE SURGERY     right  . LAMINECTOMY     cervical  . LASER PHOTO ABLATION Left 08/19/2017   Procedure: LASER PHOTO ABLATION;  Surgeon: [name], MD;  Location: [facility];  Service: Ophthalmology;  Laterality: Left;  . MEMBRANE PEEL Left 03/22/2017   Procedure: POSSIBLE MEMBRANE PEEL WITH SILICONE OIL AND PERFLURON;  Surgeon: [name], MD;  Location: [facility];  Service: Ophthalmology;  Laterality: Left;  . NEPHRECTOMY     PT STATES RIGHT KIDNEY WAS REMOVED  . PAROTIDECTOMY Right 06/13/2018   Procedure: RIGHT TOTAL PAROTIDECTOMY;  Surgeon: Leta Baptist, MD;  Location: [facility];  Service: ENT;  Laterality: Right;  . PARS PLANA VITRECTOMY Left 03/22/2017   Procedure: PARS PLANA VITRECTOMY WITH 25 GAUGE;  Surgeon: [name], MD;  Location: [facility];  Service: Ophthalmology;  Laterality: Left;  . PARS PLANA VITRECTOMY W/ SCLERAL BUCKLE Left 08/19/2017   Procedure: 35 GAUGE PARS PLANA VITRECTOMY WITH SILCONE OIL REMOVAL;  Surgeon: [name], MD;  Location: [facility];  Service: Ophthalmology;  Laterality: Left;  . 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 SALIVARY STONE REMOVAL Right 02/21/2019   Procedure: RIGHT PAROTID FISTULA REPAIR;  Surgeon: Leta Baptist, MD;  Location: [facility];  Service: ENT;  Laterality: Right;  . SHOULDER SURGERY  2020   [facility]  . TOTAL KNEE ARTHROPLASTY Left 10/19/2012  Procedure: LEFT TOTAL KNEE ARTHROPLASTY;  Surgeon: Magnus Sinning, MD;  Location: [facility];  Service: Orthopedics;  Laterality: Left;  [name] VITRECTOMY 25 GAUGE WITH SCLERAL BUCKLE Left 02/17/2017   Procedure: VITRECTOMY 25 GAUGE WITH SCLERAL BUCKLE membrane peel, injection of silicone oil, and endolaser photocoagulation.;  Surgeon: [name], MD;  Location: [facility];  Service: Ophthalmology;  Laterality: Left;    Home Medications:  Allergies as of 07/03/2020      Reactions   Lipitor [atorvastatin] Rash, Other (See  Comments)   Passed out      Medication List       Accurate as of July 03, 2020 11:04 AM. If you have any questions, ask your nurse or doctor.        allopurinol 300 MG tablet Commonly known as: ZYLOPRIM Take 300 mg by mouth daily.   apixaban 5 MG Tabs tablet Commonly known as: ELIQUIS Take 1 tablet (5 mg total) by mouth 2 (two) times daily.   b complex vitamins tablet Take 1 tablet by mouth daily.   CeraVe Itch Relief 1 % Lotn Generic drug: pramoxine Apply 1 application topically 2 (two) times daily as needed (itching/dry skin.).   clobetasol 0.05 % external solution Commonly known as: TEMOVATE Apply 1 application topically daily as needed.   guaiFENesin 600 MG 12 hr tablet Commonly known as: MUCINEX Take 600 mg by mouth 2 (two) times daily.   ketoconazole 2 % shampoo Commonly known as: NIZORAL Apply 1 application topically every other day.   ketorolac 0.5 % ophthalmic solution Commonly known as: ACULAR Place 1 drop into the left eye in the morning and at bedtime.   multivitamin with minerals Tabs tablet Take 1 tablet by mouth daily.   mupirocin ointment 2 % Commonly known as: BACTROBAN Apply 1 application topically daily.   nitrofurantoin (macrocrystal-monohydrate) 100 MG capsule Commonly known as: MACROBID Take 1 capsule (100 mg total) by mouth every 12 (twelve) hours. Started by: Nicolette Bang, MD   NON FORMULARY   oxyCODONE-acetaminophen 5-325 MG tablet Commonly known as: Percocet Take 1 tablet by mouth every 6 (six) hours as needed for severe pain.   prednisoLONE acetate 1 % ophthalmic suspension Commonly known as: PRED FORTE Place 1 drop into the left eye in the morning and at bedtime.   riluzole 50 MG tablet Commonly known as: Rilutek Take 1 tablet (50 mg total) by mouth every 12 (twelve) hours.   sodium chloride 5 % ophthalmic solution Commonly known as: MURO 128 Place 1 drop into the left eye in the morning and at bedtime.    Trelegy Ellipta 200-62.5-25 MCG/INH Aepb Generic drug: Fluticasone-Umeclidin-Vilant INHALE 1 PUFF ONCE DAILY What changed: See the new instructions.   triamcinolone 0.1 % Commonly known as: KENALOG Apply 1 application topically 2 (two) times daily as needed (skin irritation.).   Vitamin D3 50 MCG (2000 UT) Tabs Take by mouth.       Allergies:  Allergies  Allergen Reactions  . Lipitor [Atorvastatin] Rash and Other (See Comments)    Passed out    Family History: Family History  Problem Relation Age of Onset  . Diabetes Mother   . COPD Brother   . COPD Sister   . Heart attack Father   . Amblyopia Neg Hx   . Blindness Neg Hx   . Cataracts Neg Hx   . Glaucoma Neg Hx   . Macular degeneration Neg Hx   . Retinal detachment Neg Hx   . Retinitis pigmentosa Neg Hx  Social History:  reports that he quit smoking about 25 years ago. His smoking use included cigarettes and pipe. He has a 80.00 pack-year smoking history. He has never used smokeless tobacco. He reports that he does not drink alcohol and does not use drugs.  ROS: All other review of systems were reviewed and are negative except what is noted above in HPI  Physical Exam: BP 105/64   Pulse 94   Temp 98.1 F (36.7 C)   Ht 6\' 1"  (1.854 m)   Wt 178 lb (80.7 kg)   BMI 23.48 kg/m   Constitutional:  Alert and oriented, No acute distress. HEENT: [place] AT, moist mucus membranes.  Trachea midline, no masses. Cardiovascular: No clubbing, cyanosis, or edema. Respiratory: Normal respiratory effort, no increased work of breathing. GI: Abdomen is soft, nontender, nondistended, no abdominal masses GU: No CVA tenderness.  Lymph: No cervical or inguinal lymphadenopathy. Skin: No rashes, bruises or suspicious lesions. Neurologic: Grossly intact, no focal deficits, moving all 4 extremities. Psychiatric: Normal mood and affect.  Laboratory Data: Lab Results  Component Value Date   WBC 6.5 06/26/2020   HGB 8.3 (L)  06/26/2020   HCT 28.3 (L) 06/26/2020   MCV 90.7 06/26/2020   PLT 218 06/26/2020    Lab Results  Component Value Date   CREATININE 0.80 06/26/2020    No results found for: PSA  No results found for: TESTOSTERONE  No results found for: HGBA1C  Urinalysis    Component Value Date/Time   COLORURINE RED (A) 06/23/2020 1551   APPEARANCEUR CLOUDY (A) 06/23/2020 1551   LABSPEC 1.013 06/23/2020 1551   PHURINE 7.0 06/23/2020 1551   GLUCOSEU NEGATIVE 06/23/2020 1551   HGBUR LARGE (A) 06/23/2020 1551   BILIRUBINUR NEGATIVE 06/23/2020 [place] 06/23/2020 1551   PROTEINUR 100 (A) 06/23/2020 1551   NITRITE NEGATIVE 06/23/2020 1551   LEUKOCYTESUR MODERATE (A) 06/23/2020 1551    Lab Results  Component Value Date   BACTERIA MANY (A) 06/23/2020    Pertinent Imaging:  No results found for this or any previous visit.  No results found for this or any previous visit.  No results found for this or any previous visit.  No results found for this or any previous visit.  Results for orders placed during the hospital encounter of 05/29/20  US RENAL  Narrative CLINICAL DATA:  Left renal cyst. Status post right nephrectomy. Status post left ureteral stent placement  EXAM: RENAL / URINARY TRACT ULTRASOUND COMPLETE  COMPARISON:  CT dated May 29, 2020  FINDINGS: Right Kidney:  The patient is status post right-sided nephrectomy.  Left Kidney:  Renal measurements: 13.8 x 6.8 x 5.9 cm = volume: 289 mL. There is no significant collecting system dilatation. A small 1.1 cm cyst is noted arising from the lower pole.  Bladder:  There is layering debris within the urinary bladder. The left ureteral stent is identified. The left ureteral jet is noted.  Other:  None.  IMPRESSION: 1. No definite left-sided hydronephrosis. 2. There is debris within the urinary bladder which is nonspecific and should be correlated with urinalysis. 3. The left-sided ureteral  stent is noted extending into the urinary bladder. 4. A simple appearing cyst is noted arising from the lower pole. No concerning renal lesion was identified on this study. 5. Status post right-sided nephrectomy.   Electronically Signed By: Constance Holster M.D. On: 05/31/2020 17:33  No results found for this or any previous visit.  No results found for this  or any previous visit.  No results found for this or any previous visit.   Assessment & Plan:    1. Kidney stones -RTC 1 week for stent removal - Urinalysis, Routine w reflex microscopic  2. Acute cystitis with hematuria macrobid 100mg  BID for 7 days   No follow-ups on file.  Nicolette Bang, MD  [facility] [place]

## 2020-07-04 DIAGNOSIS — I2699 Other pulmonary embolism without acute cor pulmonale: Secondary | ICD-10-CM | POA: Diagnosis not present

## 2020-07-04 DIAGNOSIS — G1221 Amyotrophic lateral sclerosis: Secondary | ICD-10-CM | POA: Diagnosis not present

## 2020-07-04 DIAGNOSIS — M109 Gout, unspecified: Secondary | ICD-10-CM | POA: Diagnosis not present

## 2020-07-04 DIAGNOSIS — M6250 Muscle wasting and atrophy, not elsewhere classified, unspecified site: Secondary | ICD-10-CM | POA: Diagnosis not present

## 2020-07-04 DIAGNOSIS — I2510 Atherosclerotic heart disease of native coronary artery without angina pectoris: Secondary | ICD-10-CM | POA: Diagnosis not present

## 2020-07-04 DIAGNOSIS — N136 Pyonephrosis: Secondary | ICD-10-CM | POA: Diagnosis not present

## 2020-07-04 DIAGNOSIS — T83592A Infection and inflammatory reaction due to indwelling ureteral stent, initial encounter: Secondary | ICD-10-CM | POA: Diagnosis not present

## 2020-07-04 DIAGNOSIS — E441 Mild protein-calorie malnutrition: Secondary | ICD-10-CM | POA: Diagnosis not present

## 2020-07-04 DIAGNOSIS — E7800 Pure hypercholesterolemia, unspecified: Secondary | ICD-10-CM | POA: Diagnosis not present

## 2020-07-04 DIAGNOSIS — J449 Chronic obstructive pulmonary disease, unspecified: Secondary | ICD-10-CM | POA: Diagnosis not present

## 2020-07-05 ENCOUNTER — Ambulatory Visit
Admission: RE | Admit: 2020-07-05 | Discharge: 2020-07-05 | Disposition: A | Discharge status: Home / Self Care | Payer: Medicare HMO | Source: Ambulatory Visit | Attending: Family Medicine | Admitting: Family Medicine

## 2020-07-05 DIAGNOSIS — Z905 Acquired absence of kidney: Secondary | ICD-10-CM | POA: Diagnosis not present

## 2020-07-05 DIAGNOSIS — N281 Cyst of kidney, acquired: Secondary | ICD-10-CM | POA: Diagnosis not present

## 2020-07-05 DIAGNOSIS — I2510 Atherosclerotic heart disease of native coronary artery without angina pectoris: Secondary | ICD-10-CM | POA: Diagnosis not present

## 2020-07-05 DIAGNOSIS — I2699 Other pulmonary embolism without acute cor pulmonale: Secondary | ICD-10-CM | POA: Diagnosis not present

## 2020-07-05 DIAGNOSIS — N136 Pyonephrosis: Secondary | ICD-10-CM | POA: Diagnosis not present

## 2020-07-05 DIAGNOSIS — J449 Chronic obstructive pulmonary disease, unspecified: Secondary | ICD-10-CM | POA: Diagnosis not present

## 2020-07-05 DIAGNOSIS — N1330 Unspecified hydronephrosis: Secondary | ICD-10-CM | POA: Diagnosis not present

## 2020-07-05 DIAGNOSIS — E7800 Pure hypercholesterolemia, unspecified: Secondary | ICD-10-CM | POA: Diagnosis not present

## 2020-07-05 DIAGNOSIS — G1221 Amyotrophic lateral sclerosis: Secondary | ICD-10-CM | POA: Diagnosis not present

## 2020-07-05 DIAGNOSIS — M109 Gout, unspecified: Secondary | ICD-10-CM | POA: Diagnosis not present

## 2020-07-05 DIAGNOSIS — T83592A Infection and inflammatory reaction due to indwelling ureteral stent, initial encounter: Secondary | ICD-10-CM | POA: Diagnosis not present

## 2020-07-05 DIAGNOSIS — M6250 Muscle wasting and atrophy, not elsewhere classified, unspecified site: Secondary | ICD-10-CM | POA: Diagnosis not present

## 2020-07-05 DIAGNOSIS — E441 Mild protein-calorie malnutrition: Secondary | ICD-10-CM | POA: Diagnosis not present

## 2020-07-05 LAB — URINE CULTURE: Organism ID, Bacteria: NO GROWTH

## 2020-07-09 ENCOUNTER — Other Ambulatory Visit (INDEPENDENT_AMBULATORY_CARE_PROVIDER_SITE_OTHER): Payer: Self-pay | Admitting: Ophthalmology

## 2020-07-09 DIAGNOSIS — I2699 Other pulmonary embolism without acute cor pulmonale: Secondary | ICD-10-CM | POA: Diagnosis not present

## 2020-07-09 DIAGNOSIS — I2510 Atherosclerotic heart disease of native coronary artery without angina pectoris: Secondary | ICD-10-CM | POA: Diagnosis not present

## 2020-07-09 DIAGNOSIS — E7800 Pure hypercholesterolemia, unspecified: Secondary | ICD-10-CM | POA: Diagnosis not present

## 2020-07-09 DIAGNOSIS — M6250 Muscle wasting and atrophy, not elsewhere classified, unspecified site: Secondary | ICD-10-CM | POA: Diagnosis not present

## 2020-07-09 DIAGNOSIS — M109 Gout, unspecified: Secondary | ICD-10-CM | POA: Diagnosis not present

## 2020-07-09 DIAGNOSIS — T83592A Infection and inflammatory reaction due to indwelling ureteral stent, initial encounter: Secondary | ICD-10-CM | POA: Diagnosis not present

## 2020-07-09 DIAGNOSIS — J449 Chronic obstructive pulmonary disease, unspecified: Secondary | ICD-10-CM | POA: Diagnosis not present

## 2020-07-09 DIAGNOSIS — G1221 Amyotrophic lateral sclerosis: Secondary | ICD-10-CM | POA: Diagnosis not present

## 2020-07-09 DIAGNOSIS — E441 Mild protein-calorie malnutrition: Secondary | ICD-10-CM | POA: Diagnosis not present

## 2020-07-09 DIAGNOSIS — N136 Pyonephrosis: Secondary | ICD-10-CM | POA: Diagnosis not present

## 2020-07-10 ENCOUNTER — Inpatient Hospital Stay (HOSPITAL_COMMUNITY): Payer: Medicare HMO

## 2020-07-10 ENCOUNTER — Other Ambulatory Visit: Payer: Self-pay

## 2020-07-10 ENCOUNTER — Ambulatory Visit (INDEPENDENT_AMBULATORY_CARE_PROVIDER_SITE_OTHER): Payer: Medicare HMO | Admitting: Urology

## 2020-07-10 ENCOUNTER — Other Ambulatory Visit: Payer: Medicare HMO | Admitting: Urology

## 2020-07-10 ENCOUNTER — Encounter: Payer: Self-pay | Admitting: Urology

## 2020-07-10 ENCOUNTER — Inpatient Hospital Stay (HOSPITAL_COMMUNITY): Payer: Medicare HMO | Attending: Hematology | Admitting: Hematology

## 2020-07-10 ENCOUNTER — Encounter (HOSPITAL_COMMUNITY): Payer: Self-pay | Admitting: Hematology

## 2020-07-10 VITALS — BP 102/76 | HR 98 | Temp 98.1°F | Resp 16 | Wt 166.8 lb

## 2020-07-10 VITALS — BP 119/74 | HR 102 | Temp 97.8°F | Ht 73.0 in | Wt 178.0 lb

## 2020-07-10 DIAGNOSIS — N200 Calculus of kidney: Secondary | ICD-10-CM

## 2020-07-10 DIAGNOSIS — G1221 Amyotrophic lateral sclerosis: Secondary | ICD-10-CM

## 2020-07-10 DIAGNOSIS — Z7901 Long term (current) use of anticoagulants: Secondary | ICD-10-CM | POA: Diagnosis not present

## 2020-07-10 DIAGNOSIS — I2694 Multiple subsegmental pulmonary emboli without acute cor pulmonale: Secondary | ICD-10-CM | POA: Diagnosis not present

## 2020-07-10 DIAGNOSIS — Z85528 Personal history of other malignant neoplasm of kidney: Secondary | ICD-10-CM | POA: Diagnosis not present

## 2020-07-10 DIAGNOSIS — R351 Nocturia: Secondary | ICD-10-CM | POA: Diagnosis not present

## 2020-07-10 DIAGNOSIS — Z801 Family history of malignant neoplasm of trachea, bronchus and lung: Secondary | ICD-10-CM | POA: Diagnosis not present

## 2020-07-10 DIAGNOSIS — Z79899 Other long term (current) drug therapy: Secondary | ICD-10-CM | POA: Diagnosis not present

## 2020-07-10 DIAGNOSIS — Z87891 Personal history of nicotine dependence: Secondary | ICD-10-CM | POA: Diagnosis not present

## 2020-07-10 DIAGNOSIS — N3001 Acute cystitis with hematuria: Secondary | ICD-10-CM | POA: Diagnosis not present

## 2020-07-10 LAB — URINALYSIS, ROUTINE W REFLEX MICROSCOPIC
Bilirubin, UA: NEGATIVE
Nitrite, UA: POSITIVE — AB
Specific Gravity, UA: 1.015 (ref 1.005–1.030)
Urobilinogen, Ur: 2 mg/dL — ABNORMAL HIGH (ref 0.2–1.0)
pH, UA: 6 (ref 5.0–7.5)

## 2020-07-10 LAB — MICROSCOPIC EXAMINATION
RBC, Urine: >30 /hpf — AB (ref 0–2)
Renal Epithel, UA: NONE SEEN /hpf
WBC, UA: >30 /hpf — AB (ref 0–5)

## 2020-07-10 LAB — D-DIMER, QUANTITATIVE: D-Dimer, Quant: 0.39 ug/mL-FEU (ref 0.00–0.50)

## 2020-07-10 LAB — PSA: Prostatic Specific Antigen: 3.44 ng/mL (ref 0.00–4.00)

## 2020-07-10 MED ORDER — CIPROFLOXACIN HCL 500 MG PO TABS
500.0000 mg | ORAL_TABLET | Freq: Once | ORAL | Status: AC
Start: 2020-07-10 — End: 2020-07-10
  Administered 2020-07-10: 500 mg via ORAL

## 2020-07-10 NOTE — Progress Notes (Signed)
[address] [place], [place] [address]   CLINIC:  Medical Oncology/Hematology  Patient Care Team: [name], MD as PCP - General (Internal Medicine) Elsie Stain, MD (Pulmonary Disease) Suella Broad, MD as Consulting Physician (Physical Medicine and Rehabilitation) Valente David, RN as [place] Management  CHIEF COMPLAINTS/PURPOSE OF CONSULTATION:  Evaluation of right subsegmental pulmonary emboli  HISTORY OF PRESENTING ILLNESS:  Jerome Mays 80 y.o. male is here because of right subsegmental pulmonary emboli, at the request of Wyn Quaker, NP, from [facility]. He came to [facility] on 05/29/2020 was started on heparin drip and discharged on Eliquis.  Today he is accompanied by his wife, [name], and he reports feeling okay. He is using oxygen via [place] since he came out of the hospital and his breathing has improved. He will get SOB if he is not using oxygen via . He has a history of ALS and right renal cancer, but no history of lupus. He was never infected with COVID and he never had VTE's before. He went to [facility] after having left flank pain and was subsequently discovered to have PE's in his right lung. He was diagnosed with ALS in his arms and shoulders in 2021 and is being treated in [place]; he cannot raise or move his arms, though he can move his fingers but cannot grip. He has lost 10 lbs in the past 2 weeks due to his hospitalization. He denies having any N/V. His last colonoscopy was in 2017.  Prior to the hospitalization, he was mainly sedentary at home. He stopped working in his wood shop since he was diagnosed with ALS and is fed by his wife. He quit smoking in 1992. 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 He denies having family history of PE's or DVT's or miscarriages. His brother had lung cancer after smoking.    MEDICAL HISTORY:  Past Medical History:  Diagnosis Date  . ALS (amyotrophic lateral  sclerosis) ([place]) 2021  . Anemia    low iron  . Arthritis   . Atypical chest pain 01/04/2020  . Colonic polyp   . Constipation   . COPD (chronic obstructive pulmonary disease) ([place])   . Coronary artery calcification seen on CAT scan 01/04/2020  . GERD (gastroesophageal reflux disease)   . Gout   . History of kidney stones   . Kidney failure   . Malignant tumor of kidney ([place])   . Muscle atrophy   . Pain    LOWER BACK AND LEFT HIP - HX OF PREVIOUS LUMBAR AND CERVICAL SURGERY--PT STATES HE HAS HAD NUMBNESS IN BOTH FEET SINCE HIS BACK SURGERY  . Pure hypercholesterolemia 01/04/2020  . Renal calculus   . Retinal detachment   . Shortness of breath 01/04/2020    SURGICAL HISTORY: Past Surgical History:  Procedure Laterality Date  . BACK SURGERY     LUMBAR SURGERY  . CATARACT EXTRACTION Bilateral 2003   Surgeon unknown  . CYSTOSCOPY W/ URETERAL STENT PLACEMENT Left 05/30/2020   Procedure: CYSTOSCOPY URETERAL STENT PLACEMENT;  Surgeon: Remi Haggard, MD;  Location: [facility];  Service: Urology;  Laterality: Left;  . CYSTOSCOPY/URETEROSCOPY/HOLMIUM LASER/STENT PLACEMENT Left 06/18/2020   Procedure: CYSTOSCOPY/URETEROSCOPY/HOLMIUM LASER/STENT EXCHANGE;  Surgeon: Remi Haggard, MD;  Location: [facility];  Service: Urology;  Laterality: Left;  1 HR  . EYE SURGERY     BILATERAL CATARACT EXTRACTIONS  . GIVENS CAPSULE STUDY N/A 10/07/2015   Procedure:  GIVENS CAPSULE STUDY;  Surgeon: Carol Ada, MD;  Location: [facility];  Service: Endoscopy;  Laterality: N/A;  . HAND SURGERY     left  . KNEE SURGERY     right  . LAMINECTOMY     cervical  . LASER PHOTO ABLATION Left 08/19/2017   Procedure: LASER PHOTO ABLATION;  Surgeon: [name], MD;  Location: [facility];  Service: Ophthalmology;  Laterality: Left;  . MEMBRANE PEEL Left 03/22/2017   Procedure: POSSIBLE MEMBRANE PEEL WITH SILICONE OIL AND PERFLURON;  Surgeon: [name], MD;  Location: [facility];  Service: Ophthalmology;  Laterality: Left;   . NEPHRECTOMY     PT STATES RIGHT KIDNEY WAS REMOVED  . PAROTIDECTOMY Right 06/13/2018   Procedure: RIGHT TOTAL PAROTIDECTOMY;  Surgeon: Leta Baptist, MD;  Location: [facility];  Service: ENT;  Laterality: Right;  . PARS PLANA VITRECTOMY Left 03/22/2017   Procedure: PARS PLANA VITRECTOMY WITH 25 GAUGE;  Surgeon: [name], MD;  Location: [facility];  Service: Ophthalmology;  Laterality: Left;  . PARS PLANA VITRECTOMY W/ SCLERAL BUCKLE Left 08/19/2017   Procedure: 61 GAUGE PARS PLANA VITRECTOMY WITH SILCONE OIL REMOVAL;  Surgeon: [name], MD;  Location: [place];  Service: Ophthalmology;  Laterality: Left;  . 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 SALIVARY STONE REMOVAL Right 02/21/2019   Procedure: RIGHT PAROTID FISTULA REPAIR;  Surgeon: Leta Baptist, MD;  Location: [place];  Service: ENT;  Laterality: Right;  . SHOULDER SURGERY  2020   [facility]  . TOTAL KNEE ARTHROPLASTY Left 10/19/2012   Procedure: LEFT TOTAL KNEE ARTHROPLASTY;  Surgeon: Magnus Sinning, MD;  [facility];  Service: Orthopedics;  Laterality: Left;  [name] VITRECTOMY 25 GAUGE WITH SCLERAL BUCKLE Left 02/17/2017   Procedure: VITRECTOMY 25 GAUGE WITH SCLERAL BUCKLE membrane peel, injection of silicone oil, and endolaser photocoagulation.;  Surgeon: [name], MD;  Location: [place] [facility];  Service: Ophthalmology;  Laterality: Left;    SOCIAL HISTORY: Social History   Socioeconomic History  . Marital status: Married    Spouse name: Not on file  . Number of children: 3  . Years of education: 8th grade  . Highest education level: Not on file  Occupational History  . Occupation: Retired  Tobacco Use  . Smoking status: Former Smoker    Packs/day: 2.00    Years: 40.00    Pack years: 80.00    Types: Cigarettes, Pipe    Quit date: 06/09/1995    Years since quitting: 25.1  . Smokeless tobacco: Never Used  Vaping Use  . Vaping Use: Never used  Substance and Sexual Activity  . Alcohol use: No  . Drug use: No   . Sexual activity: Not on file  Other Topics Concern  . Not on file  Social History Narrative   Lives with wife, [name].   Right-handed.   No daily caffeine use.      Social Determinants of Health   Financial Resource Strain: Low Risk   . Difficulty of Paying Living Expenses: Not hard at all  Food Insecurity: No Food Insecurity  . Worried About Charity fundraiser in the Last Year: Never true  . Ran Out of Food in the Last Year: Never true  Transportation Needs: No Transportation Needs  . Lack of Transportation (Medical): No  . Lack of Transportation (Non-Medical): No  Physical Activity: Not on file  Stress: Not on file  Social Connections: Not on file  Intimate Partner Violence: Not on file  FAMILY HISTORY: Family History  Problem Relation Age of Onset  . Diabetes Mother   . COPD Brother   . COPD Sister   . Heart attack Father   . Amblyopia Neg Hx   . Blindness Neg Hx   . Cataracts Neg Hx   . Glaucoma Neg Hx   . Macular degeneration Neg Hx   . Retinal detachment Neg Hx   . Retinitis pigmentosa Neg Hx     ALLERGIES:  is allergic to lipitor [atorvastatin].  MEDICATIONS:  Current Outpatient Medications  Medication Sig Dispense Refill  . allopurinol (ZYLOPRIM) 300 MG tablet Take 300 mg by mouth daily.    [name] apixaban (ELIQUIS) 5 MG TABS tablet Take 1 tablet (5 mg total) by mouth 2 (two) times daily. 60 tablet 3  . b complex vitamins tablet Take 1 tablet by mouth daily.    . Cholecalciferol (VITAMIN D3) 50 MCG (2000 UT) TABS Take by mouth.    . clobetasol (TEMOVATE) 0.05 % external solution Apply 1 application topically daily as needed.    [name] guaiFENesin (MUCINEX) 600 MG 12 hr tablet Take 600 mg by mouth 2 (two) times daily.    [name] ketoconazole (NIZORAL) 2 % shampoo Apply 1 application topically every other day.    . ketorolac (ACULAR) 0.5 % ophthalmic solution INSTILL 1 DROP INTO LEFT EYE 4 TIMES DAILY 5 mL 0  . Multiple Vitamin (MULTIVITAMIN WITH MINERALS) TABS  tablet Take 1 tablet by mouth daily.    . mupirocin ointment (BACTROBAN) 2 % Apply 1 application topically daily.    . NON FORMULARY     . oxyCODONE-acetaminophen (PERCOCET) 5-325 MG tablet Take 1 tablet by mouth every 6 (six) hours as needed for severe pain. 20 tablet 0  . pramoxine (CERAVE ITCH RELIEF) 1 % LOTN Apply 1 application topically 2 (two) times daily as needed (itching/dry skin.).    [name] prednisoLONE acetate (PRED FORTE) 1 % ophthalmic suspension Place 1 drop into the left eye in the morning and at bedtime.    . riluzole (RILUTEK) 50 MG tablet Take 1 tablet (50 mg total) by mouth every 12 (twelve) hours. 60 tablet 11  . sodium chloride (MURO 128) 5 % ophthalmic solution Place 1 drop into the left eye in the morning and at bedtime.    Viviana Simpler ELLIPTA 200-62.5-25 MCG/INH AEPB INHALE 1 PUFF ONCE DAILY (Patient taking differently: Inhale 1 puff into the lungs daily.) 60 each 0  . triamcinolone (KENALOG) 0.1 % Apply 1 application topically 2 (two) times daily as needed (skin irritation.).     No current facility-administered medications for this visit.    REVIEW OF SYSTEMS:   Review of Systems  Constitutional: Positive for appetite change (50%) and fatigue (75%).  Respiratory: Positive for shortness of breath.   Gastrointestinal: Positive for constipation. Negative for nausea and vomiting.  All other systems reviewed and are negative.    PHYSICAL EXAMINATION: ECOG PERFORMANCE STATUS: 2 - Symptomatic, <50% confined to bed  Vitals:   07/10/20 1330  BP: 102/76  Pulse: 98  Resp: 16  Temp: 98.1 F (36.7 C)  SpO2: 96%   Filed Weights   07/10/20 1330  Weight: 166 lb 12.8 oz (75.7 kg)   Physical Exam Vitals reviewed.  Constitutional:      Appearance: Normal appearance.     Interventions: Nasal cannula in place.  Cardiovascular:     Rate and Rhythm: Normal rate and regular rhythm.     Pulses: Normal pulses.  Heart sounds: Normal heart sounds.  Pulmonary:     Effort:  Pulmonary effort is normal.     Breath sounds: Normal breath sounds.  Abdominal:     Palpations: Abdomen is soft. There is no hepatomegaly or mass.     Tenderness: There is no abdominal tenderness.  Musculoskeletal:     Right lower leg: Edema (trace) present.     Left lower leg: Edema (trace) present.  Neurological:     General: No focal deficit present.     Mental Status: He is alert and oriented to person, place, and time.  Psychiatric:        Mood and Affect: Mood normal.        Behavior: Behavior normal.      LABORATORY DATA:  I have reviewed the data as listed Recent Results (from the past 2160 hour(s))  Comprehensive metabolic panel     Status: Abnormal   Collection Time: 06/24/20  4:30 AM  Result Value Ref Range   Sodium 140 135 - 145 mmol/L   Potassium 4.1 3.5 - 5.1 mmol/L   Chloride 109 98 - 111 mmol/L   CO2 27 22 - 32 mmol/L   Glucose, Bld 110 (H) 70 - 99 mg/dL    Comment: Glucose reference range applies only to samples taken after fasting for at least 8 hours.   BUN 15 8 - 23 mg/dL   Creatinine, Ser 6.290.77 0.61 - 1.24 mg/dL   Calcium 9.0 8.9 - 52.810.3 mg/dL   Total Protein 5.8 (L) 6.5 - 8.1 g/dL   Albumin 2.8 (L) 3.5 - 5.0 g/dL   AST 21 15 - 41 U/L   ALT 12 0 - 44 U/L   Alkaline Phosphatase 59 38 - 126 U/L   Total Bilirubin 0.5 0.3 - 1.2 mg/dL   GFR, Estimated >41>60 >32>60 mL/min    Comment: (NOTE) Calculated using the CKD-EPI Creatinine Equation (2021)    Anion gap 4 (L) 5 - 15    Comment: Performed at [facility], [address] [place], [place] [address]  Magnesium     Status: None   Collection Time: 06/24/20  4:30 AM  Result Value Ref Range   Magnesium 2.3 1.7 - 2.4 mg/dL    Comment: Performed at [facility], [address] [place], [place] [address]  CBC WITH DIFFERENTIAL     Status: Abnormal   Collection Time: 06/24/20  4:30 AM  Result Value Ref Range   WBC 7.2 4.0 - 10.5 K/uL   RBC 3.01 (L) 4.22 - 5.81 MIL/uL   Hemoglobin 8.0 (L) 13.0 - 17.0 g/dL    HCT 66.427.1 (L) 40.339.0 - 52.0 %   MCV 90.0 80.0 - 100.0 fL   MCH 26.6 26.0 - 34.0 pg   MCHC 29.5 (L) 30.0 - 36.0 g/dL   RDW 47.417.6 (H) 25.911.5 - 56.315.5 %   Platelets 217 150 - 400 K/uL   nRBC 0.0 0.0 - 0.2 %   Neutrophils Relative % 75 %   Neutro Abs 5.3 1.7 - 7.7 K/uL   Lymphocytes Relative 15 %   Lymphs Abs 1.1 0.7 - 4.0 K/uL   Monocytes Relative 8 %   Monocytes Absolute 0.6 0.1 - 1.0 K/uL   Eosinophils Relative 2 %   Eosinophils Absolute 0.2 0.0 - 0.5 K/uL   Basophils Relative 0 %   Basophils Absolute 0.0 0.0 - 0.1 K/uL   Immature Granulocytes 0 %   Abs Immature Granulocytes 0.03 0.00 - 0.07 K/uL  Comment: Performed at [facility], [address] [place], [place] [address]  Basic metabolic panel     Status: Abnormal   Collection Time: 06/25/20  6:22 AM  Result Value Ref Range   Sodium 138 135 - 145 mmol/L   Potassium 3.9 3.5 - 5.1 mmol/L   Chloride 105 98 - 111 mmol/L   CO2 25 22 - 32 mmol/L   Glucose, Bld 105 (H) 70 - 99 mg/dL    Comment: Glucose reference range applies only to samples taken after fasting for at least 8 hours.   BUN 17 8 - 23 mg/dL   Creatinine, Ser 0.80 0.61 - 1.24 mg/dL   Calcium 9.1 8.9 - 10.3 mg/dL   GFR, Estimated >60 >60 mL/min    Comment: (NOTE) Calculated using the CKD-EPI Creatinine Equation (2021)    Anion gap 8 5 - 15    Comment: Performed at [facility], [address], [place], [place] [address]  Magnesium     Status: None   Collection Time: 06/25/20  6:22 AM  Result Value Ref Range   Magnesium 2.1 1.7 - 2.4 mg/dL    Comment: Performed at [facility], [address] [place], [place] [address]  CBC     Status: Abnormal   Collection Time: 06/25/20  6:22 AM  Result Value Ref Range   WBC 7.0 4.0 - 10.5 K/uL   RBC 3.15 (L) 4.22 - 5.81 MIL/uL   Hemoglobin 8.4 (L) 13.0 - 17.0 g/dL   HCT 28.6 (L) 39.0 - 52.0 %   MCV 90.8 80.0 - 100.0 fL   MCH 26.7 26.0 - 34.0 pg   MCHC 29.4 (L) 30.0 - 36.0 g/dL   RDW 17.9 (H) 11.5 - 15.5 %   Platelets 229 150 -  400 K/uL   nRBC 0.0 0.0 - 0.2 %    Comment: Performed at [facility], [address], [place], [place] [address]  Basic metabolic panel     Status: None   Collection Time: 06/26/20  4:31 AM  Result Value Ref Range   Sodium 139 135 - 145 mmol/L   Potassium 3.8 3.5 - 5.1 mmol/L   Chloride 106 98 - 111 mmol/L   CO2 26 22 - 32 mmol/L   Glucose, Bld 96 70 - 99 mg/dL    Comment: Glucose reference range applies only to samples taken after fasting for at least 8 hours.   BUN 19 8 - 23 mg/dL   Creatinine, Ser 0.80 0.61 - 1.24 mg/dL   Calcium 9.0 8.9 - 10.3 mg/dL   GFR, Estimated >60 >60 mL/min    Comment: (NOTE) Calculated using the CKD-EPI Creatinine Equation (2021)    Anion gap 7 5 - 15    Comment: Performed at [facility], [address], [place], [place] [address]  Magnesium     Status: None   Collection Time: 06/26/20  4:31 AM  Result Value Ref Range   Magnesium 2.1 1.7 - 2.4 mg/dL    Comment: Performed at [facility], [address], [place], [place] [address]  CBC     Status: Abnormal   Collection Time: 06/26/20  4:31 AM  Result Value Ref Range   WBC 6.5 4.0 - 10.5 K/uL   RBC 3.12 (L) 4.22 - 5.81 MIL/uL   Hemoglobin 8.3 (L) 13.0 - 17.0 g/dL   HCT 28.3 (L) 39.0 - 52.0 %   MCV 90.7 80.0 - 100.0 fL   MCH 26.6 26.0 - 34.0 pg   MCHC  29.3 (L) 30.0 - 36.0 g/dL   RDW 17.7 (H) 11.5 - 15.5 %   Platelets 218 150 - 400 K/uL   nRBC 0.0 0.0 - 0.2 %    Comment: Performed at [facility], [address], [place], [place] [address]    RADIOGRAPHIC STUDIES: I have personally reviewed the radiological images as listed and agreed with the findings in the report. CT ABDOMEN PELVIS WO CONTRAST  Result Date: 06/23/2020 CLINICAL DATA:  Hematuria, unknown cause. Left-sided abdominal pain. Nausea and constipation. EXAM: CT ABDOMEN AND PELVIS WITHOUT CONTRAST CT LUMBAR SPINE without contrast TECHNIQUE: Multidetector CT imaging of the abdomen and pelvis was performed following the standard protocol  without IV contrast. Multiplanar CT reconstructions of the lumbar spine. COMPARISON:  CT abdomen pelvis 05/29/2020 FINDINGS: Lower chest: Coronary artery calcifications. Bilateral lower lobe atelectasis again noted. Calcified granuloma at the right base. Hepatobiliary: No focal liver abnormality. No gallstones, gallbladder wall thickening, or pericholecystic fluid. No biliary dilatation. Pancreas: No focal lesion. Normal pancreatic contour. No surrounding inflammatory changes. No main pancreatic ductal dilatation. Spleen: Normal in size without focal abnormality. Adrenals/Urinary Tract: No adrenal nodule bilaterally. Status post right nephrectomy. Interval placement of a left ureteral stent with proximal pigtail terminating within a an inferior pole calyx and distal pigtail terminating within the urinary bladder lumen. Interval development of Peri-ureteral fat stranding and interval worsening of mild hydroureteronephrosis on the left. Several nephrolithiasis (2:2:24, 27,30). Couple of 2-4 mm calcifications along the ureteral stent in the mid ureter (2:50). Previously noted proximal ureteral stones are no longer visualized. Couple of layering punctate calcifications within the lumen of the urinary bladder wall (2:74). Hypodensity within the inferior pole left kidney likely represents a simple renal cyst. The urinary bladder is otherwise unremarkable. Stomach/Bowel: Stomach is within normal limits. No evidence of bowel wall thickening or dilatation. The appendix not definitely identified. Vascular/Lymphatic: No abdominal aorta or iliac aneurysm. Severe atherosclerotic plaque of the aorta and its branches. Scattered descending and sigmoid diverticulosis. No abdominal, pelvic, or inguinal lymphadenopathy. Reproductive: Penile pump implant with reservoir within the anterior lower abdomen. No inflammatory changes surrounding the reservoir noted on this noncontrast study. Other: No intraperitoneal free fluid. No  intraperitoneal free gas. No organized fluid collection. Musculoskeletal: No abdominal wall hernia or abnormality. No suspicious lytic or blastic osseous lesions. No acute displaced fracture. CT LUMBAR SPINE: Segmentation: 5 non-rib-bearing lumbar vertebral bodies. Alignment: Normal. Vertebral body/inter vertebral spaces: Multilevel moderate severe degenerative change of the spine with moderate severe intervertebral disc space narrowing. No suspicious lytic or blastic osseous lesions. Redemonstration of a well-defined sclerotic lesion within the L3 vertebral body that likely represents a bone island. No severe osseous neural foraminal or central canal stenosis. Soft tissues: Unremarkable. IMPRESSION: 1. Interval placement of a left ureteral stent in grossly appropriate position. Couple associated 2 and 56mm ureterolithiasis within the mid left ureter. Interval slight worsening of mild left hydroureteronephrosis. Associated peri-ureteral fat stranding may be reactive versus could represent an infection with limited evaluation on this noncontrast study. Correlate with urinalysis for infection. 2. Status post right nephrectomy. 3. Colonic diverticulosis with no acute diverticulitis. 4. No acute displaced fracture or traumatic listhesis of the lumbar spine in a patient with multilevel severe degenerative changes. Electronically Signed   By: Iven Finn M.D.   On: 06/23/2020 15:56   US RENAL  Result Date: 07/05/2020 CLINICAL DATA:  Follow-up kidney cyst EXAM: RENAL / URINARY TRACT ULTRASOUND COMPLETE COMPARISON:  05/31/2020, CT 06/23/2020 FINDINGS: Right Kidney: Status post right  nephrectomy Left Kidney: Renal measurements: 13.6 x 7.2 x 9.1 cm = volume: 466 mL. Cortex appears echogenic. Moderate left hydronephrosis. Simple appearing exophytic cyst off the midpole measuring 1.2 x 1.2 x 1.3 cm. Linear echogenicity in the region of proximal ureter presumably corresponds to stent. Sonographer measures solid area at  the lower pole measuring 3.7 x 2.6 x 3.2 cm, this is isoechoic to renal cortex. Bladder: Appears normal for degree of bladder distention. Partially visualized stent within the bladder. Other: None. IMPRESSION: 1. Status post right nephrectomy. 2. Moderate left hydronephrosis 3. Stable 1.2 cm simple cyst left kidney. 4. 3.7 cm solid area in the lower left kidney, this appears isoechoic to adjacent renal tissue, was not seen on December 2021 renal ultrasound and no solid lesions were seen on contrast-enhanced CT from December 2021, the appearance suggests possible focally prominent cortical tissue. Short interval sonographic follow-up is suggested. Electronically Signed   By: Donavan Foil M.D.   On: 07/05/2020 19:17   CT L-SPINE NO CHARGE  Result Date: 06/23/2020 CLINICAL DATA:  Hematuria, unknown cause. Left-sided abdominal pain. Nausea and constipation. EXAM: CT ABDOMEN AND PELVIS WITHOUT CONTRAST CT LUMBAR SPINE without contrast TECHNIQUE: Multidetector CT imaging of the abdomen and pelvis was performed following the standard protocol without IV contrast. Multiplanar CT reconstructions of the lumbar spine. COMPARISON:  CT abdomen pelvis 05/29/2020 FINDINGS: Lower chest: Coronary artery calcifications. Bilateral lower lobe atelectasis again noted. Calcified granuloma at the right base. Hepatobiliary: No focal liver abnormality. No gallstones, gallbladder wall thickening, or pericholecystic fluid. No biliary dilatation. Pancreas: No focal lesion. Normal pancreatic contour. No surrounding inflammatory changes. No main pancreatic ductal dilatation. Spleen: Normal in size without focal abnormality. Adrenals/Urinary Tract: No adrenal nodule bilaterally. Status post right nephrectomy. Interval placement of a left ureteral stent with proximal pigtail terminating within a an inferior pole calyx and distal pigtail terminating within the urinary bladder lumen. Interval development of Peri-ureteral fat stranding and  interval worsening of mild hydroureteronephrosis on the left. Several nephrolithiasis (2:2:24, 27,30). Couple of 2-4 mm calcifications along the ureteral stent in the mid ureter (2:50). Previously noted proximal ureteral stones are no longer visualized. Couple of layering punctate calcifications within the lumen of the urinary bladder wall (2:74). Hypodensity within the inferior pole left kidney likely represents a simple renal cyst. The urinary bladder is otherwise unremarkable. Stomach/Bowel: Stomach is within normal limits. No evidence of bowel wall thickening or dilatation. The appendix not definitely identified. Vascular/Lymphatic: No abdominal aorta or iliac aneurysm. Severe atherosclerotic plaque of the aorta and its branches. Scattered descending and sigmoid diverticulosis. No abdominal, pelvic, or inguinal lymphadenopathy. Reproductive: Penile pump implant with reservoir within the anterior lower abdomen. No inflammatory changes surrounding the reservoir noted on this noncontrast study. Other: No intraperitoneal free fluid. No intraperitoneal free gas. No organized fluid collection. Musculoskeletal: No abdominal wall hernia or abnormality. No suspicious lytic or blastic osseous lesions. No acute displaced fracture. CT LUMBAR SPINE: Segmentation: 5 non-rib-bearing lumbar vertebral bodies. Alignment: Normal. Vertebral body/inter vertebral spaces: Multilevel moderate severe degenerative change of the spine with moderate severe intervertebral disc space narrowing. No suspicious lytic or blastic osseous lesions. Redemonstration of a well-defined sclerotic lesion within the L3 vertebral body that likely represents a bone island. No severe osseous neural foraminal or central canal stenosis. Soft tissues: Unremarkable. IMPRESSION: 1. Interval placement of a left ureteral stent in grossly appropriate position. Couple associated 2 and 88mm ureterolithiasis within the mid left ureter. Interval slight worsening of mild  left hydroureteronephrosis. Associated  peri-ureteral fat stranding may be reactive versus could represent an infection with limited evaluation on this noncontrast study. Correlate with urinalysis for infection. 2. Status post right nephrectomy. 3. Colonic diverticulosis with no acute diverticulitis. 4. No acute displaced fracture or traumatic listhesis of the lumbar spine in a patient with multilevel severe degenerative changes. Electronically Signed   By: Iven Finn M.D.   On: 06/23/2020 15:56   DG C-Arm 1-60 Min-No Report  Result Date: 06/18/2020 Fluoroscopy was utilized by the requesting physician.  No radiographic interpretation.    ASSESSMENT:  1.  Subsegmental pulmonary emboli in right middle and lower lobes without acute cor pulmonale: -Developed ALS with weakness in the upper extremities and decreased mobility since the last 1 year. -CT angio PE protocol on 05/29/2020 showed 2 small filling defects within the subsegmental pulmonary artery branches in the anterior right middle lobe and in the posterior right lower lobe.  These are new since previous study on 02/25/2020.  Stable mild mediastinal and hilar lymphadenopathy with central calcifications consistent with old granulomatous disease. -Has been tolerating Eliquis 5 mg twice daily without any major problems.  2.  Social/family history: -Quit smoking 30 years ago.  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 -No family history of DVT/PE. -Brother had lung cancer and was a smoker.  3.  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    PLAN:  1.  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 -Last colonoscopy was in 2017 with a polyp removed. -We will do further work-up with PSA, D-dimer, factor V Leiden, prothrombin gene mutation, lupus anticoagulant, anticardiolipin antibody, antibeta-2 blood protein 1  antibody. -RTC 3 weeks to discuss results.   All questions were answered. The patient knows to call the clinic with any problems, questions or concerns.   Derek Jack, MD 07/10/20 2:37 PM  [place] [phone]   I, Milinda Antis, am acting as a scribe for Dr. Sanda Linger.  I, Derek Jack MD, have reviewed the above documentation for accuracy and completeness, and I agree with the above.

## 2020-07-10 NOTE — Progress Notes (Signed)
   07/10/20  CC: followup nephrolithiasis  HPI: Mr Gladden is a 79yo here for left ureteral stent removal Blood pressure 119/74, pulse (!) 102, temperature 97.8 F (36.6 C), height 6\' 1"  (1.854 m), weight 178 lb (80.7 kg). NED. A&Ox3.   No respiratory distress   Abd soft, NT, ND Normal phallus with bilateral descended testicles  Cystoscopy Procedure Note  Patient identification was confirmed, informed consent was obtained, and patient was prepped using Betadine solution.  Lidocaine jelly was administered per urethral meatus.     Pre-Procedure: - Inspection reveals a normal caliber ureteral meatus.  Procedure: The flexible cystoscope was introduced without difficulty - No urethral strictures/lesions are present. - Enlarged prostate  - Normal bladder neck - Bilateral ureteral orifices identified - Bladder mucosa  reveals no ulcers, tumors, or lesions - No bladder stones - No trabeculation   Using a grasper the left ureteral stent was removed intact  Post-Procedure: - Patient tolerated the procedure well  Assessment/ Plan:   Return in about 4 weeks (around 08/07/2020) for renal US.  Nicolette Bang, MD

## 2020-07-10 NOTE — Patient Instructions (Signed)
[place] at [facility] Discharge Instructions  You were seen and examined today by Dr. Delton Coombes. Dr. Delton Coombes is a hematologist meaning he specializes in blood disorders. Dr. Delton Coombes discussed your past medical history, family history of blood disorders/cancers and the events that led to you being here today.  You were referred to the [facility] due to pulmonary emboli, these are clots in your lungs. Dr. Delton Coombes has recommended additional lab work. Dr. Delton Coombes is ruling out any genetic disorders that predispose you to blood clots.  We will see you back in 3 weeks to discuss results.   Thank you for choosing [place] at [facility] to provide your oncology and hematology care.  To afford each patient quality time with our provider, please arrive at least 15 minutes before your scheduled appointment time.   If you have a lab appointment with the [facility] please come in thru the Main Entrance and check in at the main information desk.  You need to re-schedule your appointment should you arrive 10 or more minutes late.  We strive to give you quality time with our providers, and arriving late affects you and other patients whose appointments are after yours.  Also, if you no show three or more times for appointments you may be dismissed from the clinic at the providers discretion.     Again, thank you for choosing [facility].  Our hope is that these requests will decrease the amount of time that you wait before being seen by our physicians.       _____________________________________________________________  Should you have questions after your visit to [facility], please contact our office at [phone] and follow the prompts.  Our office hours are 8:00 a.m. and 4:30 p.m. Monday - Friday.  Please note that voicemails left after 4:00 p.m. may not be returned until the following business day.  We are  closed weekends and major holidays.  You do have access to a nurse 24-7, just call the main number to the clinic [phone] and do not press any options, hold on the line and a nurse will answer the phone.    For prescription refill requests, have your pharmacy contact our office and allow 72 hours.    Due to Covid, you will need to wear a mask upon entering the hospital. If you do not have a mask, a mask will be given to you at the Main Entrance upon arrival. For doctor visits, patients may have 1 support person age 82 or older with them. For treatment visits, patients can not have anyone with them due to social distancing guidelines and our immunocompromised population.

## 2020-07-10 NOTE — Patient Instructions (Signed)
Ureteral Stent Implantation  Ureteral stent implantation is a procedure to insert (implant) a flexible, soft, plastic tube (stent) into a ureter. Ureters are the tube-like parts of the body that drain urine from the kidneys. The stent supports the ureter while it heals and helps to drain urine. You may have a ureteral stent implanted after having a procedure to remove a blockage from the ureter (ureterolysis or pyeloplasty). You may also have a stent implanted to open the flow of urine when you have a blockage caused by a kidney stone, tumor, blood clot, or infection. You have two ureters, one on each side of the body. The ureters connect the kidneys to the organ that holds urine until it passes out of the body (bladder). The stent is placed so that one end is in the kidney, and one end is in the bladder. The stent is usually taken out after your ureter has healed. Depending on your condition, you may have a stent for just a few weeks, or you may have a long-term stent that will need to be replaced every few months. Tell a health care provider about:  Any allergies you have.  All medicines you are taking, including vitamins, herbs, eye drops, creams, and over-the-counter medicines.  Any problems you or family members have had with anesthetic medicines.  Any blood disorders you have.  Any surgeries you have had.  Any medical conditions you have.  Whether you are pregnant or may be pregnant. What are the risks? Generally, this is a safe procedure. However, problems may occur, including:  Infection.  Bleeding.  Allergic reactions to medicines.  Damage to other structures or organs. Tearing (perforation) of the ureter is possible.  Movement of the stent away from where it is placed during surgery (migration). What happens before the procedure? Medicines Ask your health care provider about:  Changing or stopping your regular medicines. This is especially important if you are taking  diabetes medicines or blood thinners.  Taking medicines such as aspirin and ibuprofen. These medicines can thin your blood. Do not take these medicines unless your health care provider tells you to take them.  Taking over-the-counter medicines, vitamins, herbs, and supplements. Eating and drinking Follow instructions from your health care provider about eating and drinking, which may include:  8 hours before the procedure - stop eating heavy meals or foods, such as meat, fried foods, or fatty foods.  6 hours before the procedure - stop eating light meals or foods, such as toast or cereal.  6 hours before the procedure - stop drinking milk or drinks that contain milk.  2 hours before the procedure - stop drinking clear liquids. Staying hydrated Follow instructions from your health care provider about hydration, which may include:  Up to 2 hours before the procedure - you may continue to drink clear liquids, such as water, clear fruit juice, black coffee, and plain tea. General instructions  Do not drink alcohol.  Do not use any products that contain nicotine or tobacco for at least 4 weeks before the procedure. These products include cigarettes, e-cigarettes, and chewing tobacco. If you need help quitting, ask your health care provider.  You may have an exam or testing, such as imaging or blood tests.  Ask your health care provider what steps will be taken to help prevent infection. These may include: ? Removing hair at the surgery site. ? Washing skin with a germ-killing soap. ? Taking antibiotic medicine.  Plan to have someone take you home   from the hospital or clinic.  If you will be going home right after the procedure, plan to have someone with you for 24 hours. What happens during the procedure?  An IV will be inserted into one of your veins.  You may be given a medicine to help you relax (sedative).  You may be given a medicine to make you fall asleep (general  anesthetic).  A thin, tube-shaped instrument with a light and tiny camera at the end (cystoscope) will be inserted into your urethra. The urethra is the tube that drains urine from the bladder out of the body. In men, the urethra opens at the end of the penis. In women, the urethra opens in front of the vaginal opening.  The cystoscope will be passed into your bladder.  A thin wire (guide wire) will be passed through your bladder and into your ureter. This is used to guide the stent into your ureter.  The stent will be inserted into your ureter.  The guide wire and the cystoscope will be removed.  A flexible tube (catheter) may be inserted through your urethra so that one end is in your bladder. This helps to drain urine from your bladder. The procedure may vary among hospitals and health care providers. What happens after the procedure?  Your blood pressure, heart rate, breathing rate, and blood oxygen level will be monitored until you leave the hospital or clinic.  You may continue to receive medicine and fluids through an IV.  You may have some soreness or pain in your abdomen and urethra. Medicines will be available to help you.  You will be encouraged to get up and walk around as soon as you can.  You may have a catheter draining your urine.  You will have some blood in your urine.  Do not drive for 24 hours if you were given a sedative during your procedure. Summary  Ureteral stent implantation is a procedure to insert a flexible, soft, plastic tube (stent) into a ureter.  You may have a stent implanted to support the ureter while it heals after a procedure or to open the flow of urine if there is a blockage.  Follow instructions from your health care provider about taking medicines and about eating and drinking before the procedure.  Depending on your condition, you may have a stent for just a few weeks, or you may have a long-term stent that will need to be replaced every  few months. This information is not intended to replace advice given to you by your health care provider. Make sure you discuss any questions you have with your health care provider. Document Revised: 03/01/2018 Document Reviewed: 03/02/2018 Elsevier Patient Education  2021 [employer].

## 2020-07-10 NOTE — Progress Notes (Signed)
Urological Symptom Review  Patient is experiencing the following symptoms: Frequent urination Get up at night to urinate Stream starts and stops Blood in urine   Review of Systems  Gastrointestinal (upper)  : Negative for upper GI symptoms  Gastrointestinal (lower) : Negative for lower GI symptoms  Constitutional : Negative for symptoms  Skin: Itching  Eyes: Blurred vision  Ear/Nose/Throat : Negative for Ear/Nose/Throat symptoms  Hematologic/Lymphatic: Negative for Hematologic/Lymphatic symptoms  Cardiovascular : Negative for cardiovascular symptoms  Respiratory : Negative for respiratory symptoms  Endocrine: Negative for endocrine symptoms  Musculoskeletal: Negative for musculoskeletal symptoms  Neurological: Negative Psychologic: Negative for psychiatric symptoms

## 2020-07-11 ENCOUNTER — Other Ambulatory Visit: Payer: Self-pay | Admitting: *Deleted

## 2020-07-11 DIAGNOSIS — Z7901 Long term (current) use of anticoagulants: Secondary | ICD-10-CM | POA: Diagnosis not present

## 2020-07-11 DIAGNOSIS — K5900 Constipation, unspecified: Secondary | ICD-10-CM | POA: Diagnosis not present

## 2020-07-11 DIAGNOSIS — N390 Urinary tract infection, site not specified: Secondary | ICD-10-CM | POA: Diagnosis not present

## 2020-07-11 DIAGNOSIS — M6250 Muscle wasting and atrophy, not elsewhere classified, unspecified site: Secondary | ICD-10-CM | POA: Diagnosis not present

## 2020-07-11 DIAGNOSIS — K219 Gastro-esophageal reflux disease without esophagitis: Secondary | ICD-10-CM | POA: Diagnosis not present

## 2020-07-11 DIAGNOSIS — I2699 Other pulmonary embolism without acute cor pulmonale: Secondary | ICD-10-CM | POA: Diagnosis not present

## 2020-07-11 DIAGNOSIS — J449 Chronic obstructive pulmonary disease, unspecified: Secondary | ICD-10-CM | POA: Diagnosis not present

## 2020-07-11 DIAGNOSIS — I2510 Atherosclerotic heart disease of native coronary artery without angina pectoris: Secondary | ICD-10-CM | POA: Diagnosis not present

## 2020-07-11 DIAGNOSIS — T83592A Infection and inflammatory reaction due to indwelling ureteral stent, initial encounter: Secondary | ICD-10-CM | POA: Diagnosis not present

## 2020-07-11 DIAGNOSIS — E441 Mild protein-calorie malnutrition: Secondary | ICD-10-CM | POA: Diagnosis not present

## 2020-07-11 DIAGNOSIS — N136 Pyonephrosis: Secondary | ICD-10-CM | POA: Diagnosis not present

## 2020-07-11 DIAGNOSIS — D509 Iron deficiency anemia, unspecified: Secondary | ICD-10-CM | POA: Diagnosis not present

## 2020-07-11 DIAGNOSIS — G1221 Amyotrophic lateral sclerosis: Secondary | ICD-10-CM | POA: Diagnosis not present

## 2020-07-11 DIAGNOSIS — R634 Abnormal weight loss: Secondary | ICD-10-CM | POA: Diagnosis not present

## 2020-07-11 DIAGNOSIS — E7800 Pure hypercholesterolemia, unspecified: Secondary | ICD-10-CM | POA: Diagnosis not present

## 2020-07-11 DIAGNOSIS — M109 Gout, unspecified: Secondary | ICD-10-CM | POA: Diagnosis not present

## 2020-07-11 LAB — BETA-2-GLYCOPROTEIN I ABS, IGG/M/A
Beta-2 Glyco I IgG: <9 GPI IgG units (ref 0–20)
Beta-2-Glycoprotein I IgA: <9 GPI IgA units (ref 0–25)
Beta-2-Glycoprotein I IgM: <9 GPI IgM units (ref 0–32)

## 2020-07-11 LAB — CARDIOLIPIN ANTIBODIES, IGG, IGM, IGA
Anticardiolipin IgA: <9 APL U/mL (ref 0–11)
Anticardiolipin IgG: 14 GPL U/mL (ref 0–14)
Anticardiolipin IgM: <9 MPL U/mL (ref 0–12)

## 2020-07-11 NOTE — Patient Outreach (Signed)
[facility]) Care Management  07/11/2020  Jerome Mays Feb 28, 1941 [id number]   Outgoing call placed to member/wife, state continues to improve.  Was seen by urology yesterday, renal stents removed.  Denies complaints of bloody urine or difficulty voiding.  Was also seen by hematology yesterday, labs were drawn, awaiting results, follow up on 2/23.  Also has follow up appointments with pulmonology on 3/15 and urology on 3/18.  Member reports intermittent feelings of being full when eating, stating it feels like his food gets stuck a the top of his stomach.  Denies any nausea/vomiting but does report weight loss since Christmas, was around 180's, now 167 pounds.  Next appointment with PCP in March but wife will call office to discuss concern.  If she is unable to contact office, will discuss with hematology during next visit.  Denies any other urgent concerns, encouraged to contact this care manager with questions.  Agrees to follow up within the next month.  Goals Addressed            This Visit's Progress   . [facility]) Learn More About My Health   On track    Timeframe:  Long-Range Goal Priority:  Medium Start Date: 06/10/2020                            Expected End Date:  10/05/20                      Follow Up Date 08/08/2020    - tell my story and reason for my visit - make a list of questions - ask questions - repeat what I heard to make sure I understand - bring a list of my medicines to the visit - speak up when I don't understand  -continue to monitor urine for hematuria and notify provider for change or worsening -continue to monitor for shortness of breath or hemoptysis    Why is this important?    The best way to learn about your health and care is by talking to the doctor and nurse.   They will answer your questions and give you information in the way that you like best.    Notes:   2/3 - Reviewed current symptoms, encouraged to contact PCP regarding  GI concern    . [facility]) Make and Keep All Appointments   On track    Timeframe:  Short-Term Goal Priority:  High Start Date:  07/11/20                           Expected End Date:  08/08/20                     Follow Up Date 08/08/20    - ask family or friend for a ride - call to cancel if needed - keep a calendar with appointment dates  -request assistance from family for transportation to out of town appointments -schedule post hospital follow up with PCP -attend scheduled post hospital appointment with urologist on 06/17/2020 at 2:45   Why is this important?    Part of staying healthy is seeing the doctor for follow-up care.   If you forget your appointments, there are some things you can do to stay on track.    Notes:   2/3 - Reviewed upcoming appointments, encouraged to keep and attend    . [facility] -  Manage My Diet       Timeframe:  Short-Term Goal Priority:  Medium Start Date:       07/11/2020                      Expected End Date:       08/08/2020                Follow Up Date 08/08/2020   - ask for help if I have trouble affording healthy foods - choose foods low in fat and sugar - choose foods that are low in sodium (salt) - keep a food diary - keep healthy snacks on hand - track weight in diary - weigh myself daily    Why is this important?    A healthy diet is important for mental and physical health.   Healthy food helps repair damaged body tissue and maintains strong bones and muscles.   No single food is just right so eating a variety of proteins, fruits, vegetables and grains is best.   You may need to change what you eat or drink to manage kidney disease.   A dietitian is the best person to guide you.     Notes:       Jerome David, RN, MSN [place] [phone]

## 2020-07-12 DIAGNOSIS — I2510 Atherosclerotic heart disease of native coronary artery without angina pectoris: Secondary | ICD-10-CM | POA: Diagnosis not present

## 2020-07-12 DIAGNOSIS — J449 Chronic obstructive pulmonary disease, unspecified: Secondary | ICD-10-CM | POA: Diagnosis not present

## 2020-07-12 DIAGNOSIS — I2699 Other pulmonary embolism without acute cor pulmonale: Secondary | ICD-10-CM | POA: Diagnosis not present

## 2020-07-12 DIAGNOSIS — T83592A Infection and inflammatory reaction due to indwelling ureteral stent, initial encounter: Secondary | ICD-10-CM | POA: Diagnosis not present

## 2020-07-12 DIAGNOSIS — N136 Pyonephrosis: Secondary | ICD-10-CM | POA: Diagnosis not present

## 2020-07-12 DIAGNOSIS — E441 Mild protein-calorie malnutrition: Secondary | ICD-10-CM | POA: Diagnosis not present

## 2020-07-12 DIAGNOSIS — E7800 Pure hypercholesterolemia, unspecified: Secondary | ICD-10-CM | POA: Diagnosis not present

## 2020-07-12 DIAGNOSIS — M6250 Muscle wasting and atrophy, not elsewhere classified, unspecified site: Secondary | ICD-10-CM | POA: Diagnosis not present

## 2020-07-12 DIAGNOSIS — M109 Gout, unspecified: Secondary | ICD-10-CM | POA: Diagnosis not present

## 2020-07-12 DIAGNOSIS — G1221 Amyotrophic lateral sclerosis: Secondary | ICD-10-CM | POA: Diagnosis not present

## 2020-07-12 LAB — LUPUS ANTICOAGULANT PANEL
DRVVT: 61.2 s — ABNORMAL HIGH (ref 0.0–47.0)
PTT Lupus Anticoagulant: 39 s (ref 0.0–51.9)

## 2020-07-12 LAB — DRVVT CONFIRM: dRVVT Confirm: 1 ratio (ref 0.8–1.2)

## 2020-07-12 LAB — DRVVT MIX: dRVVT Mix: 47.8 s — ABNORMAL HIGH (ref 0.0–40.4)

## 2020-07-13 DIAGNOSIS — E7800 Pure hypercholesterolemia, unspecified: Secondary | ICD-10-CM | POA: Diagnosis not present

## 2020-07-13 DIAGNOSIS — J449 Chronic obstructive pulmonary disease, unspecified: Secondary | ICD-10-CM | POA: Diagnosis not present

## 2020-07-13 DIAGNOSIS — I2510 Atherosclerotic heart disease of native coronary artery without angina pectoris: Secondary | ICD-10-CM | POA: Diagnosis not present

## 2020-07-13 DIAGNOSIS — T83592A Infection and inflammatory reaction due to indwelling ureteral stent, initial encounter: Secondary | ICD-10-CM | POA: Diagnosis not present

## 2020-07-13 DIAGNOSIS — M6250 Muscle wasting and atrophy, not elsewhere classified, unspecified site: Secondary | ICD-10-CM | POA: Diagnosis not present

## 2020-07-13 DIAGNOSIS — E441 Mild protein-calorie malnutrition: Secondary | ICD-10-CM | POA: Diagnosis not present

## 2020-07-13 DIAGNOSIS — N136 Pyonephrosis: Secondary | ICD-10-CM | POA: Diagnosis not present

## 2020-07-13 DIAGNOSIS — G1221 Amyotrophic lateral sclerosis: Secondary | ICD-10-CM | POA: Diagnosis not present

## 2020-07-13 DIAGNOSIS — M109 Gout, unspecified: Secondary | ICD-10-CM | POA: Diagnosis not present

## 2020-07-13 DIAGNOSIS — I2699 Other pulmonary embolism without acute cor pulmonale: Secondary | ICD-10-CM | POA: Diagnosis not present

## 2020-07-15 ENCOUNTER — Encounter: Payer: Self-pay | Admitting: Urology

## 2020-07-15 DIAGNOSIS — E7800 Pure hypercholesterolemia, unspecified: Secondary | ICD-10-CM | POA: Diagnosis not present

## 2020-07-15 DIAGNOSIS — I2699 Other pulmonary embolism without acute cor pulmonale: Secondary | ICD-10-CM | POA: Diagnosis not present

## 2020-07-15 DIAGNOSIS — N136 Pyonephrosis: Secondary | ICD-10-CM | POA: Diagnosis not present

## 2020-07-15 DIAGNOSIS — I2510 Atherosclerotic heart disease of native coronary artery without angina pectoris: Secondary | ICD-10-CM | POA: Diagnosis not present

## 2020-07-15 DIAGNOSIS — T83592A Infection and inflammatory reaction due to indwelling ureteral stent, initial encounter: Secondary | ICD-10-CM | POA: Diagnosis not present

## 2020-07-15 DIAGNOSIS — J449 Chronic obstructive pulmonary disease, unspecified: Secondary | ICD-10-CM | POA: Diagnosis not present

## 2020-07-15 DIAGNOSIS — K921 Melena: Secondary | ICD-10-CM | POA: Diagnosis not present

## 2020-07-15 DIAGNOSIS — M109 Gout, unspecified: Secondary | ICD-10-CM | POA: Diagnosis not present

## 2020-07-15 DIAGNOSIS — M6250 Muscle wasting and atrophy, not elsewhere classified, unspecified site: Secondary | ICD-10-CM | POA: Diagnosis not present

## 2020-07-15 DIAGNOSIS — G1221 Amyotrophic lateral sclerosis: Secondary | ICD-10-CM | POA: Diagnosis not present

## 2020-07-15 DIAGNOSIS — E441 Mild protein-calorie malnutrition: Secondary | ICD-10-CM | POA: Diagnosis not present

## 2020-07-15 LAB — PROTHROMBIN GENE MUTATION

## 2020-07-15 LAB — FACTOR 5 LEIDEN

## 2020-07-15 NOTE — Patient Instructions (Signed)
Urinary Tract Infection, Adult A urinary tract infection (UTI) is an infection of any part of the urinary tract. The urinary tract includes:  The kidneys.  The ureters.  The bladder.  The urethra. These organs make, store, and get rid of pee (urine) in the body. What are the causes? This infection is caused by germs (bacteria) in your genital area. These germs grow and cause swelling (inflammation) of your urinary tract. What increases the risk? The following factors may make you more likely to develop this condition:  Using a small, thin tube (catheter) to drain pee.  Not being able to control when you pee or poop (incontinence).  Being male. If you are male, these things can increase the risk: ? Using these methods to prevent pregnancy:  A medicine that kills sperm (spermicide).  A device that blocks sperm (diaphragm). ? Having low levels of a male hormone (estrogen). ? Being pregnant. You are more likely to develop this condition if:  You have genes that add to your risk.  You are sexually active.  You take antibiotic medicines.  You have trouble peeing because of: ? A prostate that is bigger than normal, if you are male. ? A blockage in the part of your body that drains pee from the bladder. ? A kidney stone. ? A nerve condition that affects your bladder. ? Not getting enough to drink. ? Not peeing often enough.  You have other conditions, such as: ? Diabetes. ? A weak disease-fighting system (immune system). ? Sickle cell disease. ? Gout. ? Injury of the spine. What are the signs or symptoms? Symptoms of this condition include:  Needing to pee right away.  Peeing small amounts often.  Pain or burning when peeing.  Blood in the pee.  Pee that smells bad or not like normal.  Trouble peeing.  Pee that is cloudy.  Fluid coming from the vagina, if you are male.  Pain in the belly or lower back. Other symptoms include:  Vomiting.  Not  feeling hungry.  Feeling mixed up (confused). This may be the first symptom in older adults.  Being tired and grouchy (irritable).  A fever.  Watery poop (diarrhea). How is this treated?  Taking antibiotic medicine.  Taking other medicines.  Drinking enough water. In some cases, you may need to see a specialist. Follow these instructions at home: Medicines  Take over-the-counter and prescription medicines only as told by your doctor.  If you were prescribed an antibiotic medicine, take it as told by your doctor. Do not stop taking it even if you start to feel better. General instructions  Make sure you: ? Pee until your bladder is empty. ? Do not hold pee for a long time. ? Empty your bladder after sex. ? Wipe from front to back after peeing or pooping if you are a male. Use each tissue one time when you wipe.  Drink enough fluid to keep your pee pale yellow.  Keep all follow-up visits.   Contact a doctor if:  You do not get better after 1-2 days.  Your symptoms go away and then come back. Get help right away if:  You have very bad back pain.  You have very bad pain in your lower belly.  You have a fever.  You have chills.  You feeling like you will vomit or you vomit. Summary  A urinary tract infection (UTI) is an infection of any part of the urinary tract.  This condition is caused by   germs in your genital area.  There are many risk factors for a UTI.  Treatment includes antibiotic medicines.  Drink enough fluid to keep your pee pale yellow. This information is not intended to replace advice given to you by your health care provider. Make sure you discuss any questions you have with your health care provider. Document Revised: 01/05/2020 Document Reviewed: 01/05/2020 Elsevier Patient Education  2021 [employer] Inc.  

## 2020-07-19 DIAGNOSIS — N136 Pyonephrosis: Secondary | ICD-10-CM | POA: Diagnosis not present

## 2020-07-19 DIAGNOSIS — J449 Chronic obstructive pulmonary disease, unspecified: Secondary | ICD-10-CM | POA: Diagnosis not present

## 2020-07-19 DIAGNOSIS — I2699 Other pulmonary embolism without acute cor pulmonale: Secondary | ICD-10-CM | POA: Diagnosis not present

## 2020-07-19 DIAGNOSIS — M6250 Muscle wasting and atrophy, not elsewhere classified, unspecified site: Secondary | ICD-10-CM | POA: Diagnosis not present

## 2020-07-19 DIAGNOSIS — T83592A Infection and inflammatory reaction due to indwelling ureteral stent, initial encounter: Secondary | ICD-10-CM | POA: Diagnosis not present

## 2020-07-19 DIAGNOSIS — M109 Gout, unspecified: Secondary | ICD-10-CM | POA: Diagnosis not present

## 2020-07-19 DIAGNOSIS — G1221 Amyotrophic lateral sclerosis: Secondary | ICD-10-CM | POA: Diagnosis not present

## 2020-07-19 DIAGNOSIS — E7800 Pure hypercholesterolemia, unspecified: Secondary | ICD-10-CM | POA: Diagnosis not present

## 2020-07-19 DIAGNOSIS — E441 Mild protein-calorie malnutrition: Secondary | ICD-10-CM | POA: Diagnosis not present

## 2020-07-19 DIAGNOSIS — I2510 Atherosclerotic heart disease of native coronary artery without angina pectoris: Secondary | ICD-10-CM | POA: Diagnosis not present

## 2020-07-22 DIAGNOSIS — T83592A Infection and inflammatory reaction due to indwelling ureteral stent, initial encounter: Secondary | ICD-10-CM | POA: Diagnosis not present

## 2020-07-22 DIAGNOSIS — N136 Pyonephrosis: Secondary | ICD-10-CM | POA: Diagnosis not present

## 2020-07-22 DIAGNOSIS — E441 Mild protein-calorie malnutrition: Secondary | ICD-10-CM | POA: Diagnosis not present

## 2020-07-22 DIAGNOSIS — J449 Chronic obstructive pulmonary disease, unspecified: Secondary | ICD-10-CM | POA: Diagnosis not present

## 2020-07-22 DIAGNOSIS — I2510 Atherosclerotic heart disease of native coronary artery without angina pectoris: Secondary | ICD-10-CM | POA: Diagnosis not present

## 2020-07-22 DIAGNOSIS — G1221 Amyotrophic lateral sclerosis: Secondary | ICD-10-CM | POA: Diagnosis not present

## 2020-07-22 DIAGNOSIS — M6250 Muscle wasting and atrophy, not elsewhere classified, unspecified site: Secondary | ICD-10-CM | POA: Diagnosis not present

## 2020-07-22 DIAGNOSIS — I2699 Other pulmonary embolism without acute cor pulmonale: Secondary | ICD-10-CM | POA: Diagnosis not present

## 2020-07-22 DIAGNOSIS — E7800 Pure hypercholesterolemia, unspecified: Secondary | ICD-10-CM | POA: Diagnosis not present

## 2020-07-22 DIAGNOSIS — M109 Gout, unspecified: Secondary | ICD-10-CM | POA: Diagnosis not present

## 2020-07-25 DIAGNOSIS — M109 Gout, unspecified: Secondary | ICD-10-CM | POA: Diagnosis not present

## 2020-07-25 DIAGNOSIS — N136 Pyonephrosis: Secondary | ICD-10-CM | POA: Diagnosis not present

## 2020-07-25 DIAGNOSIS — I2699 Other pulmonary embolism without acute cor pulmonale: Secondary | ICD-10-CM | POA: Diagnosis not present

## 2020-07-25 DIAGNOSIS — T83592A Infection and inflammatory reaction due to indwelling ureteral stent, initial encounter: Secondary | ICD-10-CM | POA: Diagnosis not present

## 2020-07-25 DIAGNOSIS — E441 Mild protein-calorie malnutrition: Secondary | ICD-10-CM | POA: Diagnosis not present

## 2020-07-25 DIAGNOSIS — G1221 Amyotrophic lateral sclerosis: Secondary | ICD-10-CM | POA: Diagnosis not present

## 2020-07-25 DIAGNOSIS — I2510 Atherosclerotic heart disease of native coronary artery without angina pectoris: Secondary | ICD-10-CM | POA: Diagnosis not present

## 2020-07-25 DIAGNOSIS — E7800 Pure hypercholesterolemia, unspecified: Secondary | ICD-10-CM | POA: Diagnosis not present

## 2020-07-25 DIAGNOSIS — J449 Chronic obstructive pulmonary disease, unspecified: Secondary | ICD-10-CM | POA: Diagnosis not present

## 2020-07-25 DIAGNOSIS — M6250 Muscle wasting and atrophy, not elsewhere classified, unspecified site: Secondary | ICD-10-CM | POA: Diagnosis not present

## 2020-07-29 DIAGNOSIS — M109 Gout, unspecified: Secondary | ICD-10-CM | POA: Diagnosis not present

## 2020-07-29 DIAGNOSIS — M6250 Muscle wasting and atrophy, not elsewhere classified, unspecified site: Secondary | ICD-10-CM | POA: Diagnosis not present

## 2020-07-29 DIAGNOSIS — J449 Chronic obstructive pulmonary disease, unspecified: Secondary | ICD-10-CM | POA: Diagnosis not present

## 2020-07-29 DIAGNOSIS — N136 Pyonephrosis: Secondary | ICD-10-CM | POA: Diagnosis not present

## 2020-07-29 DIAGNOSIS — G1221 Amyotrophic lateral sclerosis: Secondary | ICD-10-CM | POA: Diagnosis not present

## 2020-07-29 DIAGNOSIS — E7800 Pure hypercholesterolemia, unspecified: Secondary | ICD-10-CM | POA: Diagnosis not present

## 2020-07-29 DIAGNOSIS — I2699 Other pulmonary embolism without acute cor pulmonale: Secondary | ICD-10-CM | POA: Diagnosis not present

## 2020-07-29 DIAGNOSIS — I2510 Atherosclerotic heart disease of native coronary artery without angina pectoris: Secondary | ICD-10-CM | POA: Diagnosis not present

## 2020-07-29 DIAGNOSIS — E441 Mild protein-calorie malnutrition: Secondary | ICD-10-CM | POA: Diagnosis not present

## 2020-07-29 DIAGNOSIS — T83592A Infection and inflammatory reaction due to indwelling ureteral stent, initial encounter: Secondary | ICD-10-CM | POA: Diagnosis not present

## 2020-07-30 ENCOUNTER — Other Ambulatory Visit: Payer: Self-pay | Admitting: Pulmonary Disease

## 2020-07-30 DIAGNOSIS — E7800 Pure hypercholesterolemia, unspecified: Secondary | ICD-10-CM | POA: Diagnosis not present

## 2020-07-30 DIAGNOSIS — I2699 Other pulmonary embolism without acute cor pulmonale: Secondary | ICD-10-CM | POA: Diagnosis not present

## 2020-07-30 DIAGNOSIS — M6250 Muscle wasting and atrophy, not elsewhere classified, unspecified site: Secondary | ICD-10-CM | POA: Diagnosis not present

## 2020-07-30 DIAGNOSIS — G1221 Amyotrophic lateral sclerosis: Secondary | ICD-10-CM | POA: Diagnosis not present

## 2020-07-30 DIAGNOSIS — T83592A Infection and inflammatory reaction due to indwelling ureteral stent, initial encounter: Secondary | ICD-10-CM | POA: Diagnosis not present

## 2020-07-30 DIAGNOSIS — M109 Gout, unspecified: Secondary | ICD-10-CM | POA: Diagnosis not present

## 2020-07-30 DIAGNOSIS — J449 Chronic obstructive pulmonary disease, unspecified: Secondary | ICD-10-CM | POA: Diagnosis not present

## 2020-07-30 DIAGNOSIS — N136 Pyonephrosis: Secondary | ICD-10-CM | POA: Diagnosis not present

## 2020-07-30 DIAGNOSIS — E441 Mild protein-calorie malnutrition: Secondary | ICD-10-CM | POA: Diagnosis not present

## 2020-07-30 DIAGNOSIS — I2510 Atherosclerotic heart disease of native coronary artery without angina pectoris: Secondary | ICD-10-CM | POA: Diagnosis not present

## 2020-07-31 ENCOUNTER — Inpatient Hospital Stay (HOSPITAL_COMMUNITY): Payer: Medicare HMO | Admitting: Hematology

## 2020-07-31 ENCOUNTER — Other Ambulatory Visit: Payer: Self-pay

## 2020-07-31 VITALS — BP 129/64 | HR 88 | Temp 97.3°F | Resp 18 | Wt 163.2 lb

## 2020-07-31 DIAGNOSIS — R351 Nocturia: Secondary | ICD-10-CM | POA: Diagnosis not present

## 2020-07-31 DIAGNOSIS — I2694 Multiple subsegmental pulmonary emboli without acute cor pulmonale: Secondary | ICD-10-CM | POA: Diagnosis not present

## 2020-07-31 DIAGNOSIS — Z87891 Personal history of nicotine dependence: Secondary | ICD-10-CM | POA: Diagnosis not present

## 2020-07-31 DIAGNOSIS — Z79899 Other long term (current) drug therapy: Secondary | ICD-10-CM | POA: Diagnosis not present

## 2020-07-31 DIAGNOSIS — Z85528 Personal history of other malignant neoplasm of kidney: Secondary | ICD-10-CM | POA: Diagnosis not present

## 2020-07-31 DIAGNOSIS — Z801 Family history of malignant neoplasm of trachea, bronchus and lung: Secondary | ICD-10-CM | POA: Diagnosis not present

## 2020-07-31 DIAGNOSIS — G1221 Amyotrophic lateral sclerosis: Secondary | ICD-10-CM | POA: Diagnosis not present

## 2020-07-31 DIAGNOSIS — Z7901 Long term (current) use of anticoagulants: Secondary | ICD-10-CM | POA: Diagnosis not present

## 2020-07-31 NOTE — Progress Notes (Signed)
[place], [place] [address]   CLINIC:  Medical Oncology/Hematology  PCP:  Jerome Bunting, MD [address] / [place] [address]  [phone]  REASON FOR VISIT:  Follow-up for right subsegmental PE  PRIOR THERAPY: None  CURRENT THERAPY: Eliquis 5 mg BID  INTERVAL HISTORY:  [name], a 80 y.o. male, returns for routine follow-up for his right subsegmental PE. Jerome Mays was last seen on 07/10/2020.  Today he is accompanied by his wife and he reports feeling okay. He is able to walk but is not able to use his arms or hands; he goes to the bathroom 5 times during the day and 2-3 times at night. He is tolerating Eliquis well and denies having melena, hematochezia, hematuria or nosebleeds. He has not fallen recently. The PE's were discovered when he went to [facility] for kidney stones.  He is scheduled to go to [facility] on 03/01 for his ALS follow-up.   REVIEW OF SYSTEMS:  Review of Systems  Constitutional: Positive for fatigue (75%). Negative for appetite change.  HENT:   Negative for nosebleeds.   Gastrointestinal: Negative for blood in stool.  Genitourinary: Positive for nocturia. Negative for hematuria.   All other systems reviewed and are negative.   PAST MEDICAL/SURGICAL HISTORY:  Past Medical History:  Diagnosis Date  . ALS (amyotrophic lateral sclerosis) ([facility] [place]) 2021  . Anemia    low iron  . Arthritis   . Atypical chest pain 01/04/2020  . Colonic polyp   . Constipation   . COPD (chronic obstructive pulmonary disease) ([place])   . Coronary artery calcification seen on CAT scan 01/04/2020  . GERD (gastroesophageal reflux disease)   . Gout   . History of kidney stones   . Kidney failure   . Malignant tumor of kidney ([place])   . Muscle atrophy   . Pain    LOWER BACK AND LEFT HIP - HX OF PREVIOUS LUMBAR AND CERVICAL SURGERY--PT STATES HE HAS HAD NUMBNESS IN BOTH FEET SINCE HIS BACK SURGERY  . Pure hypercholesterolemia 01/04/2020   . Renal calculus   . Retinal detachment   . Shortness of breath 01/04/2020   Past Surgical History:  Procedure Laterality Date  . BACK SURGERY     LUMBAR SURGERY  . CATARACT EXTRACTION Bilateral 2003   Surgeon unknown  . CYSTOSCOPY W/ URETERAL STENT PLACEMENT Left 05/30/2020   Procedure: CYSTOSCOPY URETERAL STENT PLACEMENT;  Surgeon: Remi Haggard, MD;  Location: [facility];  Service: Urology;  Laterality: Left;  . CYSTOSCOPY/URETEROSCOPY/HOLMIUM LASER/STENT PLACEMENT Left 06/18/2020   Procedure: CYSTOSCOPY/URETEROSCOPY/HOLMIUM LASER/STENT EXCHANGE;  Surgeon: Remi Haggard, MD;  Location: [facility];  Service: Urology;  Laterality: Left;  1 HR  . EYE SURGERY     BILATERAL CATARACT EXTRACTIONS  . GIVENS CAPSULE STUDY N/A 10/07/2015   Procedure: GIVENS CAPSULE STUDY;  Surgeon: Carol Ada, MD;  Location: [facility];  Service: Endoscopy;  Laterality: N/A;  . HAND SURGERY     left  . KNEE SURGERY     right  . LAMINECTOMY     cervical  . LASER PHOTO ABLATION Left 08/19/2017   Procedure: LASER PHOTO ABLATION;  Surgeon: [name], MD;  Location: [facility];  Service: Ophthalmology;  Laterality: Left;  . MEMBRANE PEEL Left 03/22/2017   Procedure: POSSIBLE MEMBRANE PEEL WITH SILICONE OIL AND PERFLURON;  Surgeon: [name], MD;  Location: [facility];  Service: Ophthalmology;  Laterality: Left;  . NEPHRECTOMY     PT  STATES RIGHT KIDNEY WAS REMOVED  . PAROTIDECTOMY Right 06/13/2018   Procedure: RIGHT TOTAL PAROTIDECTOMY;  Surgeon: Leta Baptist, MD;  Location: [facility];  Service: ENT;  Laterality: Right;  . PARS PLANA VITRECTOMY Left 03/22/2017   Procedure: PARS PLANA VITRECTOMY WITH 25 GAUGE;  Surgeon: [name], MD;  Location: [facility];  Service: Ophthalmology;  Laterality: Left;  . PARS PLANA VITRECTOMY W/ SCLERAL BUCKLE Left 08/19/2017   Procedure: 11 GAUGE PARS PLANA VITRECTOMY WITH SILCONE OIL REMOVAL;  Surgeon: [name], MD;  Location: [facility];  Service: Ophthalmology;   Laterality: Left;  . 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 SALIVARY STONE REMOVAL Right 02/21/2019   Procedure: RIGHT PAROTID FISTULA REPAIR;  Surgeon: Leta Baptist, MD;  Location: [facility];  Service: ENT;  Laterality: Right;  . SHOULDER SURGERY  2020   [facility]  . TOTAL KNEE ARTHROPLASTY Left 10/19/2012   Procedure: LEFT TOTAL KNEE ARTHROPLASTY;  Surgeon: Magnus Sinning, MD;  [facility];  Service: Orthopedics;  Laterality: Left;  [name] VITRECTOMY 25 GAUGE WITH SCLERAL BUCKLE Left 02/17/2017   Procedure: VITRECTOMY 25 GAUGE WITH SCLERAL BUCKLE membrane peel, injection of silicone oil, and endolaser photocoagulation.;  Surgeon: [name], MD;  Location: [facility];  Service: Ophthalmology;  Laterality: Left;    SOCIAL HISTORY:  Social History   Socioeconomic History  . Marital status: Married    Spouse name: Not on file  . Number of children: 3  . Years of education: 8th grade  . Highest education level: Not on file  Occupational History  . Occupation: Retired  Tobacco Use  . Smoking status: Former Smoker    Packs/day: 2.00    Years: 40.00    Pack years: 80.00    Types: Cigarettes, Pipe    Quit date: 06/09/1995    Years since quitting: 25.1  . Smokeless tobacco: Never Used  Vaping Use  . Vaping Use: Never used  Substance and Sexual Activity  . Alcohol use: No  . Drug use: No  . Sexual activity: Not on file  Other Topics Concern  . Not on file  Social History Narrative   Lives with wife, Jerome Mays.   Right-handed.   No daily caffeine use.      Social Determinants of Health   Financial Resource Strain: Low Risk   . Difficulty of Paying Living Expenses: Not hard at all  Food Insecurity: No Food Insecurity  . Worried About Charity fundraiser in the Last Year: Never true  . Ran Out of Food in the Last Year: Never true  Transportation Needs: No Transportation Needs  . Lack of Transportation (Medical): No  . Lack of Transportation (Non-Medical): No  Physical  Activity: Not on file  Stress: Not on file  Social Connections: Not on file  Intimate Partner Violence: Not on file    FAMILY HISTORY:  Family History  Problem Relation Age of Onset  . Diabetes Mother   . COPD Brother   . COPD Sister   . Heart attack Father   . Amblyopia Neg Hx   . Blindness Neg Hx   . Cataracts Neg Hx   . Glaucoma Neg Hx   . Macular degeneration Neg Hx   . Retinal detachment Neg Hx   . Retinitis pigmentosa Neg Hx     CURRENT MEDICATIONS:  Current Outpatient Medications  Medication Sig Dispense Refill  . allopurinol (ZYLOPRIM) 300 MG tablet Take 300 mg by mouth daily.    [name]  apixaban (ELIQUIS) 5 MG TABS tablet Take 1 tablet (5 mg total) by mouth 2 (two) times daily. 60 tablet 3  . aspirin 81 MG EC tablet Take one (1) tablet by mouth daily    . b complex vitamins tablet Take 1 tablet by mouth daily.    . Cholecalciferol (VITAMIN D3) 10 MCG (400 UNIT) CAPS take one a day    . Cholecalciferol (VITAMIN D3) 50 MCG (2000 UT) TABS Take by mouth.    . clobetasol (TEMOVATE) 0.05 % external solution Apply 1 application topically daily as needed.    . Ferric Maltol (ACCRUFER) 30 MG CAPS 1 capsule 1 hour before or 2 hours after meals - samples x 15 days provided    . gabapentin (NEURONTIN) 300 MG capsule Take by mouth.    [name] guaiFENesin (MUCINEX) 600 MG 12 hr tablet Take 600 mg by mouth 2 (two) times daily.    . hydrOXYzine (ATARAX/VISTARIL) 10 MG tablet See admin instructions.    [name] ketoconazole (NIZORAL) 2 % shampoo Apply 1 application topically every other day.    . ketorolac (ACULAR) 0.5 % ophthalmic solution INSTILL 1 DROP INTO LEFT EYE 4 TIMES DAILY 5 mL 0  . loteprednol (LOTEMAX) 0.5 % ophthalmic suspension one gtt into his left eye qid    . Magnesium 250 MG TABS Take 1 tablet by mouth daily.    . metoprolol tartrate (LOPRESSOR) 50 MG tablet take one tablet PO prior to CT scan    . Multiple Vitamin (MULTIVITAMIN WITH MINERALS) TABS tablet Take 1 tablet by mouth daily.     . mupirocin ointment (BACTROBAN) 2 % Apply 1 application topically daily.    . NON FORMULARY     . oxyCODONE-acetaminophen (PERCOCET) 5-325 MG tablet Take 1 tablet by mouth every 6 (six) hours as needed for severe pain. 20 tablet 0  . pantoprazole (PROTONIX) 40 MG tablet 1 tablet    . pramoxine (CERAVE ITCH RELIEF) 1 % LOTN Apply 1 application topically 2 (two) times daily as needed (itching/dry skin.).    [name] prednisoLONE acetate (PRED FORTE) 1 % ophthalmic suspension Place 1 drop into the left eye in the morning and at bedtime.    . riluzole (RILUTEK) 50 MG tablet Take 1 tablet (50 mg total) by mouth every 12 (twelve) hours. 60 tablet 11  . sodium chloride (MURO 128) 5 % ophthalmic solution Place 1 drop into the left eye in the morning and at bedtime.    Viviana Simpler ELLIPTA 200-62.5-25 MCG/INH AEPB Inhale 1 puff by mouth once daily 60 each 5  . triamcinolone (KENALOG) 0.1 % Apply 1 application topically 2 (two) times daily as needed (skin irritation.).     No current facility-administered medications for this visit.    ALLERGIES:  Allergies  Allergen Reactions  . Lipitor [Atorvastatin] Rash and Other (See Comments)    Passed out    PHYSICAL EXAM:  Performance status (ECOG): 2 - Symptomatic, <50% confined to bed  Vitals:   07/31/20 1452  BP: 129/64  Pulse: 88  Resp: 18  Temp: (!) 97.3 F (36.3 C)  SpO2: 96%   Wt Readings from Last 3 Encounters:  07/31/20 163 lb 3.2 oz (74 kg)  07/10/20 166 lb 12.8 oz (75.7 kg)  07/10/20 178 lb (80.7 kg)   Physical Exam  LABORATORY DATA:  I have reviewed the labs as listed.  CBC Latest Ref Rng & Units 06/26/2020 06/25/2020 06/24/2020  WBC 4.0 - 10.5 K/uL 6.5 7.0 7.2  Hemoglobin 13.0 -  17.0 g/dL 8.3(L) 8.4(L) 8.0(L)  Hematocrit 39.0 - 52.0 % 28.3(L) 28.6(L) 27.1(L)  Platelets 150 - 400 K/uL 218 229 217   CMP Latest Ref Rng & Units 06/26/2020 06/25/2020 06/24/2020  Glucose 70 - 99 mg/dL 96 105(H) 110(H)  BUN 8 - 23 mg/dL 19 17 15    Creatinine 0.61 - 1.24 mg/dL 0.80 0.80 0.77  Sodium 135 - 145 mmol/L 139 138 140  Potassium 3.5 - 5.1 mmol/L 3.8 3.9 4.1  Chloride 98 - 111 mmol/L 106 105 109  CO2 22 - 32 mmol/L 26 25 27   Calcium 8.9 - 10.3 mg/dL 9.0 9.1 9.0  Total Protein 6.5 - 8.1 g/dL - - 5.8(L)  Total Bilirubin 0.3 - 1.2 mg/dL - - 0.5  Alkaline Phos 38 - 126 U/L - - 59  AST 15 - 41 U/L - - 21  ALT 0 - 44 U/L - - 12      Component Value Date/Time   RBC 3.12 (L) 06/26/2020 0431   MCV 90.7 06/26/2020 0431   MCV 85 10/09/2019 0859   MCH 26.6 06/26/2020 0431   MCHC 29.3 (L) 06/26/2020 0431   RDW 17.7 (H) 06/26/2020 0431   RDW 20.2 (H) 10/09/2019 0859   LYMPHSABS 1.1 06/24/2020 0430   LYMPHSABS 1.7 10/09/2019 0859   MONOABS 0.6 06/24/2020 0430   EOSABS 0.2 06/24/2020 0430   EOSABS 0.3 10/09/2019 0859   BASOSABS 0.0 06/24/2020 0430   BASOSABS 0.1 10/09/2019 0859    DIAGNOSTIC IMAGING:  I have independently reviewed the scans and discussed with the patient. US RENAL  Result Date: 07/05/2020 CLINICAL DATA:  Follow-up kidney cyst EXAM: RENAL / URINARY TRACT ULTRASOUND COMPLETE COMPARISON:  05/31/2020, CT 06/23/2020 FINDINGS: Right Kidney: Status post right nephrectomy Left Kidney: Renal measurements: 13.6 x 7.2 x 9.1 cm = volume: 466 mL. Cortex appears echogenic. Moderate left hydronephrosis. Simple appearing exophytic cyst off the midpole measuring 1.2 x 1.2 x 1.3 cm. Linear echogenicity in the region of proximal ureter presumably corresponds to stent. Sonographer measures solid area at the lower pole measuring 3.7 x 2.6 x 3.2 cm, this is isoechoic to renal cortex. Bladder: Appears normal for degree of bladder distention. Partially visualized stent within the bladder. Other: None. IMPRESSION: 1. Status post right nephrectomy. 2. Moderate left hydronephrosis 3. Stable 1.2 cm simple cyst left kidney. 4. 3.7 cm solid area in the lower left kidney, this appears isoechoic to adjacent renal tissue, was not seen on December  2021 renal ultrasound and no solid lesions were seen on contrast-enhanced CT from December 2021, the appearance suggests possible focally prominent cortical tissue. Short interval sonographic follow-up is suggested. Electronically Signed   By: Donavan Foil M.D.   On: 07/05/2020 19:17     ASSESSMENT:  1.  Subsegmental pulmonary emboli in right middle and lower lobes without acute cor pulmonale: -Developed ALS with weakness in the upper extremities and decreased mobility since the last 1 year. -CT angio PE protocol on 05/29/2020 showed 2 small filling defects within the subsegmental pulmonary artery branches in the anterior right middle lobe and in the posterior right lower lobe.  These are new since previous study on 02/25/2020.  Stable mild mediastinal and hilar lymphadenopathy with central calcifications consistent with old granulomatous disease. -Has been tolerating Eliquis 5 mg twice daily without any major problems.  2.  Social/family history: -Quit smoking 30 years ago.  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 -No family history of DVT/PE. -Brother had lung cancer and was a smoker.  3.  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   PLAN:  1.    Unprovoked to weakly provoked pulmonary embolism: -His mobility has decreased secondary to ALS although it is limited to upper extremities. -He is tolerating Eliquis very well.  Denies any falls in the last 6 weeks. -Reviewed labs which showed negative lupus anticoagulant, negative beta-2 glycoprotein 1 and anticardiolipin antibodies.  Factor V Leiden and prothrombin gene mutation was negative.  PSA was normal.  D-dimer is normal. -I have recommended repeat CT angiogram in 4 months with repeat D-dimer. -Based on his performance status at that time, we will make a decision whether we need to continue indefinite anticoagulation.  Orders placed this encounter:  Orders Placed This Encounter  Procedures  . CT  ANGIO CHEST PE W OR WO CONTRAST  . CBC with Differential/Platelet  . Ferritin  . Iron and TIBC  . Vitamin B12  . Folate  . D-dimer, quantitative     Derek Jack, MD [place] [phone]   I, Milinda Antis, am acting as a scribe for Dr. Sanda Linger.  I, Derek Jack MD, have reviewed the above documentation for accuracy and completeness, and I agree with the above.

## 2020-07-31 NOTE — Patient Instructions (Signed)
[place] at [facility] Discharge Instructions  You were seen today by Dr. Delton Coombes. He went over your recent results; your blood work does not show any mutations in your platelets or coagulation pathway. You will be scheduled to have a CT angiogram of your chest before your next visit; do not get a CT scan of your chest in March. Dr. Delton Coombes will see you back in 4 months for labs and follow up.   Thank you for choosing [place] at [facility] to provide your oncology and hematology care.  To afford each patient quality time with our provider, please arrive at least 15 minutes before your scheduled appointment time.   If you have a lab appointment with the [facility] please come in thru the Main Entrance and check in at the main information desk  You need to re-schedule your appointment should you arrive 10 or more minutes late.  We strive to give you quality time with our providers, and arriving late affects you and other patients whose appointments are after yours.  Also, if you no show three or more times for appointments you may be dismissed from the clinic at the providers discretion.     Again, thank you for choosing [facility].  Our hope is that these requests will decrease the amount of time that you wait before being seen by our physicians.       _____________________________________________________________  Should you have questions after your visit to [facility], please contact our office at [phone] between the hours of 8:00 a.m. and 4:30 p.m.  Voicemails left after 4:00 p.m. will not be returned until the following business day.  For prescription refill requests, have your pharmacy contact our office and allow 72 hours.    Cancer Center Support Programs:   > Cancer Support Group  2nd Tuesday of the month 1pm-2pm, Journey Room

## 2020-08-02 DIAGNOSIS — J9601 Acute respiratory failure with hypoxia: Secondary | ICD-10-CM | POA: Diagnosis not present

## 2020-08-05 DIAGNOSIS — G1221 Amyotrophic lateral sclerosis: Secondary | ICD-10-CM | POA: Diagnosis not present

## 2020-08-06 DIAGNOSIS — Z87891 Personal history of nicotine dependence: Secondary | ICD-10-CM | POA: Diagnosis not present

## 2020-08-06 DIAGNOSIS — J449 Chronic obstructive pulmonary disease, unspecified: Secondary | ICD-10-CM | POA: Diagnosis not present

## 2020-08-06 DIAGNOSIS — M6281 Muscle weakness (generalized): Secondary | ICD-10-CM | POA: Diagnosis not present

## 2020-08-06 DIAGNOSIS — G1221 Amyotrophic lateral sclerosis: Secondary | ICD-10-CM | POA: Diagnosis not present

## 2020-08-06 DIAGNOSIS — J984 Other disorders of lung: Secondary | ICD-10-CM | POA: Diagnosis not present

## 2020-08-06 DIAGNOSIS — Z7189 Other specified counseling: Secondary | ICD-10-CM | POA: Diagnosis not present

## 2020-08-06 DIAGNOSIS — Z79899 Other long term (current) drug therapy: Secondary | ICD-10-CM | POA: Diagnosis not present

## 2020-08-06 DIAGNOSIS — R471 Dysarthria and anarthria: Secondary | ICD-10-CM | POA: Diagnosis not present

## 2020-08-06 DIAGNOSIS — Z5181 Encounter for therapeutic drug level monitoring: Secondary | ICD-10-CM | POA: Diagnosis not present

## 2020-08-06 DIAGNOSIS — G709 Myoneural disorder, unspecified: Secondary | ICD-10-CM | POA: Diagnosis not present

## 2020-08-08 ENCOUNTER — Other Ambulatory Visit: Payer: Self-pay | Admitting: *Deleted

## 2020-08-08 ENCOUNTER — Ambulatory Visit: Payer: Medicare HMO | Admitting: Urology

## 2020-08-08 DIAGNOSIS — E7800 Pure hypercholesterolemia, unspecified: Secondary | ICD-10-CM | POA: Diagnosis not present

## 2020-08-08 DIAGNOSIS — I2510 Atherosclerotic heart disease of native coronary artery without angina pectoris: Secondary | ICD-10-CM | POA: Diagnosis not present

## 2020-08-08 DIAGNOSIS — I2699 Other pulmonary embolism without acute cor pulmonale: Secondary | ICD-10-CM | POA: Diagnosis not present

## 2020-08-08 DIAGNOSIS — J449 Chronic obstructive pulmonary disease, unspecified: Secondary | ICD-10-CM | POA: Diagnosis not present

## 2020-08-08 DIAGNOSIS — M109 Gout, unspecified: Secondary | ICD-10-CM | POA: Diagnosis not present

## 2020-08-08 DIAGNOSIS — M6250 Muscle wasting and atrophy, not elsewhere classified, unspecified site: Secondary | ICD-10-CM | POA: Diagnosis not present

## 2020-08-08 DIAGNOSIS — E441 Mild protein-calorie malnutrition: Secondary | ICD-10-CM | POA: Diagnosis not present

## 2020-08-08 DIAGNOSIS — N136 Pyonephrosis: Secondary | ICD-10-CM | POA: Diagnosis not present

## 2020-08-08 DIAGNOSIS — G1221 Amyotrophic lateral sclerosis: Secondary | ICD-10-CM | POA: Diagnosis not present

## 2020-08-08 DIAGNOSIS — T83592A Infection and inflammatory reaction due to indwelling ureteral stent, initial encounter: Secondary | ICD-10-CM | POA: Diagnosis not present

## 2020-08-08 NOTE — Patient Outreach (Signed)
[facility]) Care Management  08/08/2020  HULEN MANDLER Jul 19, 1940 [id number]   Outgoing call placed to member/wife, phone rang busy, attempted 3 times.  Unable to leave voice message, will follow up within the next 3-4 business days.  Valente David, [place], MSN [place] [phone]

## 2020-08-12 DIAGNOSIS — M109 Gout, unspecified: Secondary | ICD-10-CM | POA: Diagnosis not present

## 2020-08-12 DIAGNOSIS — R946 Abnormal results of thyroid function studies: Secondary | ICD-10-CM | POA: Diagnosis not present

## 2020-08-12 DIAGNOSIS — Z125 Encounter for screening for malignant neoplasm of prostate: Secondary | ICD-10-CM | POA: Diagnosis not present

## 2020-08-13 ENCOUNTER — Other Ambulatory Visit: Payer: Self-pay | Admitting: *Deleted

## 2020-08-13 ENCOUNTER — Ambulatory Visit
Admission: RE | Admit: 2020-08-13 | Discharge: 2020-08-13 | Disposition: A | Discharge status: Home / Self Care | Payer: Medicare HMO | Source: Ambulatory Visit | Attending: Pulmonary Disease | Admitting: Pulmonary Disease

## 2020-08-13 DIAGNOSIS — R918 Other nonspecific abnormal finding of lung field: Secondary | ICD-10-CM

## 2020-08-13 NOTE — Patient Outreach (Signed)
[facility]) Care Management  08/13/2020  Jerome Mays 1940-08-05 [id number]   Outreach attempt #2, successful to wife.  Report she does not have much time to talk as she is preparing to take member for repeat imaging to re-assess PE.  They have follow up with pulmonology on 3/15 to discuss findings further.  State member is able to remove oxygen intermittently without any shortness of breath or chest discomfort.  She has not monitored his oxygen levels without the oxygen but state he does not appear to be in distress during these times.  He has not shown any further signs of bleeding since having urostomy stents removed, will follow up with urology on 3/18.  Denies any urgent concerns, encouraged to contact this care manager with questions.  Agrees to follow up within the next month.  Goals Addressed            This Visit's Progress   . [facility]) Learn More About My Health   On track    Timeframe:  Long-Range Goal Priority:  Medium Start Date: 06/10/2020                            Expected End Date:  10/05/20                      Follow Up Date 09/10/2020    - make a list of questions - ask questions - repeat what I heard to make sure I understand  -continue to monitor urine for hematuria and notify provider for change or worsening -continue to monitor for shortness of breath or hemoptysis    Why is this important?    The best way to learn about your health and care is by talking to the doctor and nurse.   They will answer your questions and give you information in the way that you like best.    Notes:   2/3 - Reviewed current symptoms, encouraged to contact PCP regarding GI concern  3/8 - Education regarding recovery from PE and kidney stones sent    . [facility]) Make and Keep All Appointments   On track    Timeframe:  Short-Term Goal Priority:  High Start Date:  3/8                           Expected End Date:  4/8                  Follow Up Date 4/5   - ask  family or friend for a ride - call to cancel if needed - keep a calendar with appointment dates  -request assistance from family for transportation to out of town appointments -schedule post hospital follow up with PCP -attend scheduled post hospital appointment with urologist on 06/17/2020 at 2:45   Why is this important?    Part of staying healthy is seeing the doctor for follow-up care.   If you forget your appointments, there are some things you can do to stay on track.    Notes:   2/3 - Reviewed upcoming appointments, encouraged to keep and attend  3/8 - Discussed upcoming appointments - today for radiology, pulmonology 3/15 and urology 3/18      Valente David, RN, MSN [place] Manager [phone]

## 2020-08-13 NOTE — Patient Instructions (Signed)
Kidney Stones Kidney stones are rock-like masses that form inside of the kidneys. Kidneys are organs that make pee (urine). A kidney stone may move into other parts of the urinary tract, including:  The tubes that connect the kidneys to the bladder (ureters).  The bladder.  The tube that carries urine out of the body (urethra). Kidney stones can cause very bad pain and can block the flow of pee. The stone usually leaves your body (passes) through your pee. You may need to have a doctor take out the stone. What are the causes? Kidney stones may be caused by:  A condition in which certain glands make too much parathyroid hormone (primary hyperparathyroidism).  A buildup of a type of crystals in the bladder made of a chemical called uric acid. The body makes uric acid when you eat certain foods.  Narrowing (stricture) of one or both of the ureters.  A kidney blockage that you were born with.  Past surgery on the kidney or the ureters, such as gastric bypass surgery. What increases the risk? You are more likely to develop this condition if:  You have had a kidney stone in the past.  You have a family history of kidney stones.  You do not drink enough water.  You eat a diet that is high in protein, salt (sodium), or sugar.  You are overweight or very overweight (obese). What are the signs or symptoms? Symptoms of a kidney stone may include:  Pain in the side of the belly, right below the ribs (flank pain). Pain usually spreads (radiates) to the groin.  Needing to pee often or right away (urgently).  Pain when going pee (urinating).  Blood in your pee (hematuria).  Feeling like you may vomit (nauseous).  Vomiting.  Fever and chills. How is this treated? Treatment depends on the size, location, and makeup of the kidney stones. The stones will often pass out of the body through peeing. You may need to:  Drink more fluid to help pass the stone. In some cases, you may be  given fluids through an IV tube put into one of your veins at the hospital.  Take medicine for pain.  Make changes in your diet to help keep kidney stones from coming back. Sometimes, medical procedures are needed to remove a kidney stone. This may involve:  A procedure to break up kidney stones using a beam of light (laser) or shock waves.  Surgery to remove the kidney stones. Follow these instructions at home: Medicines  Take over-the-counter and prescription medicines only as told by your doctor.  Ask your doctor if the medicine prescribed to you requires you to avoid driving or using heavy machinery. Eating and drinking  Drink enough fluid to keep your pee pale yellow. You may be told to drink at least 8-10 glasses of water each day. This will help you pass the stone.  If told by your doctor, change your diet. This may include: ? Limiting how much salt you eat. ? Eating more fruits and vegetables. ? Limiting how much meat, poultry, fish, and eggs you eat.  Follow instructions from your doctor about eating or drinking restrictions. General instructions  Collect pee samples as told by your doctor. You may need to collect a pee sample: ? 24 hours after a stone comes out. ? 8-12 weeks after a stone comes out, and every 6-12 months after that.  Strain your pee every time you pee (urinate), for as long as told. Use the   strainer that your doctor recommends.  Do not throw out the stone. Keep it so that it can be tested by your doctor.  Keep all follow-up visits as told by your doctor. This is important. You may need follow-up tests. How is this prevented? To prevent another kidney stone:  Drink enough fluid to keep your pee pale yellow. This is the best way to prevent kidney stones.  Eat healthy foods.  Avoid certain foods as told by your doctor. You may be told to eat less protein.  Stay at a healthy weight.   Where to find more information  [place]  ([facility]): www.kidney.[facility]): www.urologyhealth.org Contact a doctor if:  You have pain that gets worse or does not get better with medicine. Get help right away if:  You have a fever or chills.  You get very bad pain.  You get new pain in your belly (abdomen).  You pass out (faint).  You cannot pee. Summary  Kidney stones are rock-like masses that form inside of the kidneys.  Kidney stones can cause very bad pain and can block the flow of pee.  The stones will often pass out of the body through peeing.  Drink enough fluid to keep your pee pale yellow. This information is not intended to replace advice given to you by your health care provider. Make sure you discuss any questions you have with your health care provider. Document Revised: 10/11/2018 Document Reviewed: 10/11/2018 Elsevier Patient Education  [place]. Pulmonary Embolism  A pulmonary embolism (PE) is a sudden blockage or decrease of blood flow in one or both lungs that happens when a clot travels into the arteries of the lung (pulmonary arteries). Most blockages come from a blood clot that forms in the vein of a leg or arm (deep vein thrombosis, DVT) and travels to the lungs. A clot is blood that has thickened into a gel or solid. PE is a dangerous and life-threatening condition that needs to be treated right away. What are the causes? This condition is usually caused by a blood clot that forms in a vein and moves to the lungs. In rare cases, it may be caused by air, fat, part of a tumor, or other tissue that moves through the veins and into the lungs. What increases the risk? The following factors may make you more likely to develop this condition:  Experiencing a traumatic injury, such as breaking a hip or leg.  Having: ? A spinal cord injury. ? Major surgery, especially hip or knee replacement, or surgery on parts of the nervous system or on the abdomen. ? A stroke. ? A  blood clotting disease. ? Long-term (chronic) lung or heart disease. ? Cancer, especially if you are being treated with chemotherapy. ? A central venous catheter.  Taking medicines that contain estrogen. These include birth control pills and hormone replacement therapy.  Being: ? Pregnant. ? In the period of time after your baby is delivered (postpartum). ? Older than age 36. ? Overweight. ? A smoker, especially if you have other risks. ? Not very active (sedentary), not being able to move at all, or spending long periods sitting, such as travel over 6 hours. You are also at a greater risk if you have a leg in a cast or splint. What are the signs or symptoms? Symptoms of this condition usually start suddenly and include:  Shortness of breath during activity or at rest.  Coughing, coughing up blood,  or coughing up bloody mucus.  Chest pain, back pain, or shoulder blade pain that gets worse with deep breaths.  Rapid or irregular heartbeat.  Feeling light-headed or dizzy, or fainting.  Feeling anxious.  Pain and swelling in a leg. This is a symptom of DVT, which can lead to PE. How is this diagnosed? This condition may be diagnosed based on your medical history, a physical exam, and tests. Tests may include:  Blood tests.  An ECG (electrocardiogram) of the heart.  A CT pulmonary angiogram. This test checks blood flow in and around your lungs.  A ventilation-perfusion scan, also called a lung VQ scan. This test measures air flow and blood flow to the lungs.  An ultrasound to check for a DVT. How is this treated? Treatment for this condition depends on many factors, such as the cause of your PE, your risk for bleeding or developing more clots, and other medical conditions you may have. Treatment aims to stop blood clots from forming or growing larger. In some cases, treatment may be aimed at breaking apart or removing the blood clot. Treatment may include:  Medicines, such  as: ? Blood thinning medicines, also called anticoagulants, to stop clots from forming and growing. ? Medicines that break apart clots (thrombolytics).  Procedures, such as: ? Using a flexible tube to remove a blood clot (embolectomy) or to deliver medicine to destroy it (catheter-directed thrombolysis). ? Surgery to remove the clot (surgical embolectomy). This is rare. You may need a combination of immediate, long-term, and extended treatments. Your treatment may continue for several months (maintenance therapy) or longer depending on your medical conditions. You and your health care provider will work together to choose the treatment program that is best for you. Follow these instructions at home: Medicines  Take over-the-counter and prescription medicines only as told by your health care provider.  If you are taking blood thinners: ? Talk with your health care provider before you take any medicines that contain aspirin or NSAIDs, such as ibuprofen. These medicines increase your risk for dangerous bleeding. ? Take your medicine exactly as told, at the same time every day. ? Avoid activities that could cause injury or bruising, and follow instructions about how to prevent falls. ? Wear a medical alert bracelet or carry a card that lists what medicines you take.  Understand what foods and drugs interact with any medicines that you are taking. General instructions  Ask your health care provider when you may return to your normal activities. Avoid sitting or lying for a long time without moving.  Maintain a healthy weight. Ask your health care provider what weight is healthy for you.  Do not use any products that contain nicotine or tobacco, such as cigarettes, e-cigarettes, and chewing tobacco. If you need help quitting, ask your health care provider.  Talk with your health care provider about any travel plans. It is important to make sure that you are still able to take your medicine  while traveling.  Keep all follow-up visits as told by your health care provider. This is important. Where to find more information  American Lung Association: www.lung.org  Centers for Disease Control and Prevention: http://www.wolf.info/ Contact a health care provider if:  You missed a dose of your blood thinner medicine. Get help right away if you:  Have: ? New or increased pain, swelling, warmth, or redness in an arm or leg. ? Shortness of breath that gets worse during activity or at rest. ? A fever. ?  Worsening chest pain. ? A rapid or irregular heartbeat. ? A severe headache. ? Vision changes. ? A serious fall or accident, or you hit your head. ? Stomach pain. ? Blood in your vomit, stool, or urine. ? A cut that will not stop bleeding.  Cough up blood.  Feel light-headed or dizzy, and that feeling does not go away.  Cannot move your arms or legs.  Are confused or have memory loss. These symptoms may represent a serious problem that is an emergency. Do not wait to see if the symptoms will go away. Get medical help right away. Call your local emergency services (911 in the [place] Do not drive yourself to the hospital. Summary  A pulmonary embolism (PE) is a serious and potentially life-threatening condition, in which a blood clot from one part of the body (deep vein thrombosis, DVT) travels to the arteries of the lung, causing a sudden blockage or decrease of blood flow to the lungs. This may result in shortness of breath, chest pain, dizziness, and fainting.  Treatments for this condition usually include medicines to thin your blood (anticoagulants) or medicines to break apart blood clots (thrombolytics).  If you are given blood thinners, take your medicine exactly as told by your health care provider, at the same time every day. This is important.  Understand what foods and drugs interact with any medicines that you are taking.  If you have signs of PE or DVT, call your local  emergency services (911 in the [place] This information is not intended to replace advice given to you by your health care provider. Make sure you discuss any questions you have with your health care provider. Document Revised: 04/07/2019 Document Reviewed: 04/07/2019 Elsevier Patient Education  2021 [employer].

## 2020-08-19 DIAGNOSIS — M1990 Unspecified osteoarthritis, unspecified site: Secondary | ICD-10-CM | POA: Diagnosis not present

## 2020-08-19 DIAGNOSIS — G1221 Amyotrophic lateral sclerosis: Secondary | ICD-10-CM | POA: Diagnosis not present

## 2020-08-19 DIAGNOSIS — Z1339 Encounter for screening examination for other mental health and behavioral disorders: Secondary | ICD-10-CM | POA: Diagnosis not present

## 2020-08-19 DIAGNOSIS — J449 Chronic obstructive pulmonary disease, unspecified: Secondary | ICD-10-CM | POA: Diagnosis not present

## 2020-08-19 DIAGNOSIS — R634 Abnormal weight loss: Secondary | ICD-10-CM | POA: Diagnosis not present

## 2020-08-19 DIAGNOSIS — Z1331 Encounter for screening for depression: Secondary | ICD-10-CM | POA: Diagnosis not present

## 2020-08-19 DIAGNOSIS — M109 Gout, unspecified: Secondary | ICD-10-CM | POA: Diagnosis not present

## 2020-08-19 DIAGNOSIS — I2699 Other pulmonary embolism without acute cor pulmonale: Secondary | ICD-10-CM | POA: Diagnosis not present

## 2020-08-19 DIAGNOSIS — K219 Gastro-esophageal reflux disease without esophagitis: Secondary | ICD-10-CM | POA: Diagnosis not present

## 2020-08-19 DIAGNOSIS — C649 Malignant neoplasm of unspecified kidney, except renal pelvis: Secondary | ICD-10-CM | POA: Diagnosis not present

## 2020-08-19 DIAGNOSIS — Z0000 Encounter for general adult medical examination without abnormal findings: Secondary | ICD-10-CM | POA: Diagnosis not present

## 2020-08-19 DIAGNOSIS — Z7901 Long term (current) use of anticoagulants: Secondary | ICD-10-CM | POA: Diagnosis not present

## 2020-08-20 ENCOUNTER — Ambulatory Visit: Payer: Medicare HMO | Admitting: Pulmonary Disease

## 2020-08-20 ENCOUNTER — Other Ambulatory Visit: Payer: Self-pay

## 2020-08-20 ENCOUNTER — Encounter: Payer: Self-pay | Admitting: Pulmonary Disease

## 2020-08-20 VITALS — BP 112/66 | HR 85 | Temp 97.4°F | Ht 73.0 in | Wt 164.2 lb

## 2020-08-20 DIAGNOSIS — G1221 Amyotrophic lateral sclerosis: Secondary | ICD-10-CM

## 2020-08-20 DIAGNOSIS — J449 Chronic obstructive pulmonary disease, unspecified: Secondary | ICD-10-CM

## 2020-08-20 DIAGNOSIS — K921 Melena: Secondary | ICD-10-CM | POA: Diagnosis not present

## 2020-08-20 DIAGNOSIS — R918 Other nonspecific abnormal finding of lung field: Secondary | ICD-10-CM | POA: Diagnosis not present

## 2020-08-20 DIAGNOSIS — R82998 Other abnormal findings in urine: Secondary | ICD-10-CM | POA: Diagnosis not present

## 2020-08-20 NOTE — Addendum Note (Signed)
Addended by: Elton Sin on: 08/20/2020 11:40 AM   Modules accepted: Orders

## 2020-08-20 NOTE — Progress Notes (Signed)
Jerome Mays    [id number]    08-24-1940  Primary Care Physician:Aronson, Delfino Lovett, MD  Referring Physician: [name], MD [address] 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 He has 80-pack-year smoking history.  Quit in 1997.  Symptoms of cough, chest tightness, congestion for the past week after he was exposed to pollen and dust while mowing hay. He denies any fevers, chills, purulent sputum. He feels that the Daliresp is affecting his sleep and he is taking zzquil at night to fall asleep. He had an evaluation in January 2018 for minor hemoptysis. He had a CT scan of the chest which did not show any obvious source of hemoptysis.   Taken off Daliresp in 2018 as he cannot afford the cost and due to side effects of insomnia. 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 Currently on riluzole therapy.  Interim History: Continues follow-up at [facility].  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  He has been noncompliant with the nocturnal ventilator due to issues with the mask  He was hospitalized in December 2021 for obstructive uropathy, PE and started on anticoagulation.  CT at that time showed an ill-defined focal opacity in the right lung apex.  He had a follow-up CT earlier this week that showed resolution of the apical opacity and a new subcentimeter pulmonary nodule.  Outpatient Encounter Medications as of 08/20/2020  Medication Sig  . allopurinol (ZYLOPRIM) 300 MG tablet Take 300 mg by mouth daily.  [name] apixaban (ELIQUIS) 5 MG TABS tablet Take 1 tablet (5 mg total) by mouth 2 (two) times daily.  . clobetasol (TEMOVATE) 0.05 % external solution Apply 1 application topically daily as needed.  . gabapentin (NEURONTIN) 300 MG capsule Take 300 mg by mouth 2 (two)  times daily.  [name] ketoconazole (NIZORAL) 2 % shampoo Apply 1 application topically every other day.  . ketorolac (ACULAR) 0.5 % ophthalmic solution INSTILL 1 DROP INTO LEFT EYE 4 TIMES DAILY  . loteprednol (LOTEMAX) 0.5 % ophthalmic suspension one gtt into his left eye qid  . mirtazapine (REMERON) 7.5 MG tablet Take 7.5 mg by mouth at bedtime.  . Multiple Vitamin (MULTIVITAMIN WITH MINERALS) TABS tablet Take 1 tablet by mouth daily.  . NON FORMULARY   . pantoprazole (PROTONIX) 40 MG tablet 1 tablet  . pramoxine (CERAVE ITCH RELIEF) 1 % LOTN Apply 1 application topically 2 (two) times daily as needed (itching/dry skin.).  [name] prednisoLONE acetate (PRED FORTE) 1 % ophthalmic suspension Place 1 drop into the left eye in the morning and at bedtime.  . riluzole (RILUTEK) 50 MG tablet Take 1 tablet (50 mg total) by mouth every 12 (twelve) hours.  . sodium chloride (MURO 128) 5 % ophthalmic solution Place 1 drop into the left eye in the morning and at bedtime.  . TRELEGY ELLIPTA 200-62.5-25 MCG/INH AEPB Inhale 1 puff by mouth once daily  . triamcinolone (KENALOG) 0.1 % Apply 1 application topically 2 (two) times daily as needed (skin irritation.).  [name] aspirin 81 MG EC tablet Take one (1) tablet by mouth daily (Patient not taking: Reported on 08/20/2020)  . b complex vitamins tablet Take 1 tablet by mouth daily. (Patient not taking: Reported on 08/20/2020)  . Cholecalciferol (  VITAMIN D3) 10 MCG (400 UNIT) CAPS take one a day (Patient not taking: Reported on 08/20/2020)  . Cholecalciferol (VITAMIN D3) 50 MCG (2000 UT) TABS Take by mouth. (Patient not taking: Reported on 08/20/2020)  . Ferric Maltol (ACCRUFER) 30 MG CAPS 1 capsule 1 hour before or 2 hours after meals - samples x 15 days provided (Patient not taking: Reported on 08/20/2020)  . hydrOXYzine (ATARAX/VISTARIL) 10 MG tablet See admin instructions. (Patient not taking: Reported on 08/20/2020)  . [DISCONTINUED] guaiFENesin (MUCINEX) 600 MG 12 hr tablet Take  600 mg by mouth 2 (two) times daily.  . [DISCONTINUED] Magnesium 250 MG TABS Take 1 tablet by mouth daily.  . [DISCONTINUED] metoCLOPramide (REGLAN) 5 MG tablet Take by mouth at bedtime.  . [DISCONTINUED] metoprolol tartrate (LOPRESSOR) 50 MG tablet take one tablet PO prior to CT scan  . [DISCONTINUED] mupirocin ointment (BACTROBAN) 2 % Apply 1 application topically daily.  . [DISCONTINUED] oxyCODONE-acetaminophen (PERCOCET) 5-325 MG tablet Take 1 tablet by mouth every 6 (six) hours as needed for severe pain.   No facility-administered encounter medications on file as of 08/20/2020.   Physical Exam: Blood pressure 112/66, pulse 85, temperature (!) 97.4 F (36.3 C), temperature source Temporal, height 6\' 1"  (1.854 m), weight 164 lb 3.2 oz (74.5 kg), SpO2 99 %. Gen:      No acute distress HEENT:  EOMI, sclera anicteric Neck:     No masses; no thyromegaly Lungs:    Clear to auscultation bilaterally; normal respiratory effort CV:         Regular rate and rhythm; no murmurs Abd:      + bowel sounds; soft, non-tender; no palpable masses, no distension Ext:    No edema; adequate peripheral perfusion Skin:      Warm and dry; no rash Neuro: alert and oriented x 3 Psych: normal mood and affect  Data Reviewed: Imaging CT scan 10/21/09- Moderate centrilobar emphysema. No pulmonary masses, opacities, asbestos disease or consolidation. Stable enlarged. Stable enlarged mediastinal and bilateral hilar lymph nodes unchanged for past 3.5 years compatible with benign or reactive process.  CT scan 06/19/16-emphysema, no consolidation or lung opacity. Mediastinal and bilateral hilar lymphadenopathy stable in size. Clustered left upper lobe nodules.  CT scan 07/01/17- stable subcentimeter pulmonary nodules, stable calcified mediastinal, hilar lymphadenopathy.  CT scan 07/25/2019-stable pulmonary nodules, calcified mediastinal, hilar lymphadenopathy  CTA 02/16/2020-no PE, emphysema, stable pulmonary  nodules  CTA 05/29/2020-2 subsegmental pulmonary emboli in the right middle lobe and lower lobes.  Ill-defined pulmonary opacity in the right lung apex, emphysema.  CT chest 08/13/2020-resolution of right apical opacity, new 6 mm right lower lobe nodule, emphysema. I have reviewed the images personally.  PFTs  02/04/16 FVC 4.05 [9%), FEV1 2.40 (70%), F/F 59, TLC 101%, DLCO 35% Moderate obstructive defect with severe reduction in diffusion capacity.  09/12/2019 [[facility]] FVC 3.34 [78%], FEV1 1.89 [60%], F/F 57  01/23/2020 [[facility]] FVC 3.42 [80%], FEV1 1.72 [55%], F/F 50  Labs CBC 05/21/17-WBC 7.2, eosinophils 3.7%, absolute eos count 266 Blood allergy profile 05/21/17-IgE 22, RAST panel is negative.     Assessment:  Gold stage B COPD. Continue trelegy, supplemental oxygen  ALS Follows at [facility] on noninvasive vent at night but is noncompliant.  Discussed need to keep on the therapy given progression of neuromuscular weakness and restrictive lung disease.  He has also been prescribed a mechanical cough assist device but is noncompliant with this as well  Tracheostomy has already been discussed with him at [facility] and  in clinic today.  He is not interested in the procedure.  Pulmonary nodule Noted on recent CT last week.  Follow-up CT ordered in 1 year  New subsegmental PE Hypercoagulable work-up has been negative Follows with Dr. Delton Coombes, oncology.  He has a follow-up CTA and D-dimer planned for June 2022 when a decision will be made on whether to continue anticoagulation  Health maintenance 01/08/2014-Prevnar 13 06/09/2007-Pneumovax  Plan/Recommendations: - Continue trelegy inhaler, oxygen - Encouraged to be compliant with trelegy, cough assist device - Follow-up CT in 1 year for lung nodule  Follow-up in 6 months  Marshell Garfinkel MD [facility] 08/20/2020, 11:11 AM  CC: [name], MD

## 2020-08-20 NOTE — Patient Instructions (Signed)
Continue the Trelegy, oxygen  I recommend that he use the ventilator at night as it will help stabilize your symptoms I have reviewed your CT scan which shows a tiny lung nodule that can be followed up in 1 year Order follow-up CT chest without contrast in 1 year  Follow-up in clinic in 6 months.

## 2020-08-23 ENCOUNTER — Other Ambulatory Visit: Payer: Self-pay

## 2020-08-23 ENCOUNTER — Ambulatory Visit: Payer: Medicare HMO | Admitting: Urology

## 2020-08-23 ENCOUNTER — Ambulatory Visit (HOSPITAL_COMMUNITY)
Admission: RE | Admit: 2020-08-23 | Discharge: 2020-08-23 | Disposition: A | Discharge status: Home / Self Care | Payer: Medicare HMO | Source: Ambulatory Visit | Attending: Urology | Admitting: Urology

## 2020-08-23 DIAGNOSIS — Z905 Acquired absence of kidney: Secondary | ICD-10-CM | POA: Diagnosis not present

## 2020-08-23 DIAGNOSIS — N281 Cyst of kidney, acquired: Secondary | ICD-10-CM | POA: Diagnosis not present

## 2020-08-23 DIAGNOSIS — N200 Calculus of kidney: Secondary | ICD-10-CM | POA: Diagnosis not present

## 2020-08-23 DIAGNOSIS — Z85528 Personal history of other malignant neoplasm of kidney: Secondary | ICD-10-CM | POA: Diagnosis not present

## 2020-08-26 NOTE — Progress Notes (Signed)
. [facility] Note  08/27/2020     CHIEF COMPLAINT Patient presents for Retina Follow Up   HISTORY OF PRESENT ILLNESS: Jerome Mays is a 80 y.o. male who presents to the clinic today for:   HPI    Retina Follow Up    Patient presents with  Retinal Break/Detachment.  In left eye.  This started years ago.  Severity is moderate.  Duration of 5.5 months.  Since onset it is stable.  I, the attending physician,  performed the HPI with the patient and updated documentation appropriately.          Comments    80 y/o male pt here for 5.5 mo f/u for CME OS.  F/u is delayed.  No change in [place] OU.  Denies pain, FOL, floaters, although OS did hurt for a bit for about 10 min last night.  PF QID OS Ketorolac QID OS Muro 128 BID OS       Last edited by [name], MD on 08/27/2020  1:58 PM. (History)     Patient states vision the same OU. Had kidney stones and blood clots in lungs since last visit. On blood thinner due to blood clots in lungs. Patient denies floaters and flashes.  Referring physician: Rutherford Guys, [place],  [place] [phone]  HISTORICAL INFORMATION:   Selected notes from the MEDICAL RECORD NUMBER Initial referral from Dr. [name] for RD OS   CURRENT MEDICATIONS: Current Outpatient Medications (Ophthalmic Drugs)  Medication Sig  . ketorolac (ACULAR) 0.5 % ophthalmic solution INSTILL 1 DROP INTO LEFT EYE 4 TIMES DAILY  . ketorolac (ACULAR) 0.5 % ophthalmic solution Place 1 drop into the left eye 4 (four) times daily.  . prednisoLONE acetate (PRED FORTE) 1 % ophthalmic suspension Place 1 drop into the left eye in the morning and at bedtime.  . prednisoLONE acetate (PRED FORTE) 1 % ophthalmic suspension Place 1 drop into the left eye 4 (four) times daily.  . sodium chloride (MURO 128) 5 % ophthalmic solution Place 1 drop into the left eye in the morning and at bedtime.  [name] loteprednol (LOTEMAX) 0.5 % ophthalmic  suspension one gtt into his left eye qid (Patient not taking: Reported on 08/27/2020)   No current facility-administered medications for this visit. (Ophthalmic Drugs)   Current Outpatient Medications (Other)  Medication Sig  . allopurinol (ZYLOPRIM) 300 MG tablet Take 300 mg by mouth daily.  [name] apixaban (ELIQUIS) 5 MG TABS tablet Take 1 tablet (5 mg total) by mouth 2 (two) times daily.  [name] aspirin 81 MG EC tablet   . b complex vitamins tablet Take 1 tablet by mouth daily.  . Cholecalciferol (VITAMIN D3) 10 MCG (400 UNIT) CAPS   . Cholecalciferol (VITAMIN D3) 50 MCG (2000 UT) TABS Take by mouth.  . clobetasol (TEMOVATE) 0.05 % external solution Apply 1 application topically daily as needed.  . Ferric Maltol (ACCRUFER) 30 MG CAPS   . gabapentin (NEURONTIN) 300 MG capsule Take 300 mg by mouth 2 (two) times daily.  [name] guaiFENesin (MUCINEX) 600 MG 12 hr tablet Take by mouth.  . hydrOXYzine (ATARAX/VISTARIL) 10 MG tablet See admin instructions.  [name] ketoconazole (NIZORAL) 2 % shampoo Apply 1 application topically every other day.  . mirtazapine (REMERON) 7.5 MG tablet Take 7.5 mg by mouth at bedtime.  . Multiple Vitamin (MULTIVITAMIN WITH MINERALS) TABS tablet Take 1 tablet by mouth daily.  . NON FORMULARY   .  nystatin-triamcinolone (MYCOLOG II) cream SMARTSIG:Sparingly Topical 2-3 Times Daily PRN  . pantoprazole (PROTONIX) 40 MG tablet 1 tablet  . pramoxine (CERAVE ITCH RELIEF) 1 % LOTN Apply 1 application topically 2 (two) times daily as needed (itching/dry skin.).  [name] riluzole (RILUTEK) 50 MG tablet Take 1 tablet (50 mg total) by mouth every 12 (twelve) hours.  . TRELEGY ELLIPTA 200-62.5-25 MCG/INH AEPB Inhale 1 puff by mouth once daily  . triamcinolone (KENALOG) 0.1 % Apply 1 application topically 2 (two) times daily as needed (skin irritation.).   No current facility-administered medications for this visit. (Other)      REVIEW OF SYSTEMS: ROS    Positive for: Gastrointestinal,  Musculoskeletal, Cardiovascular, Eyes, Respiratory   Negative for: Constitutional, Neurological, Skin, Genitourinary, HENT, Endocrine, Psychiatric, Allergic/Imm, Heme/Lymph   Last edited by Matthew Folks, COA on 08/27/2020  1:35 PM. (History)       ALLERGIES Allergies  Allergen Reactions  . Lipitor [Atorvastatin] Rash and Other (See Comments)    Passed out    PAST MEDICAL HISTORY Past Medical History:  Diagnosis Date  . ALS (amyotrophic lateral sclerosis) ([place]) 2021  . Anemia    low iron  . Arthritis   . Atypical chest pain 01/04/2020  . Colonic polyp   . Constipation   . COPD (chronic obstructive pulmonary disease) ([place])   . Coronary artery calcification seen on CAT scan 01/04/2020  . GERD (gastroesophageal reflux disease)   . Gout   . History of kidney stones   . Kidney failure   . Malignant tumor of kidney ([place])   . Muscle atrophy   . Pain    LOWER BACK AND LEFT HIP - HX OF PREVIOUS LUMBAR AND CERVICAL SURGERY--PT STATES HE HAS HAD NUMBNESS IN BOTH FEET SINCE HIS BACK SURGERY  . Pure hypercholesterolemia 01/04/2020  . Renal calculus   . Retinal detachment   . Shortness of breath 01/04/2020   Past Surgical History:  Procedure Laterality Date  . BACK SURGERY     LUMBAR SURGERY  . CATARACT EXTRACTION Bilateral 2003   Surgeon unknown  . CYSTOSCOPY W/ URETERAL STENT PLACEMENT Left 05/30/2020   Procedure: CYSTOSCOPY URETERAL STENT PLACEMENT;  Surgeon: Remi Haggard, MD;  Location: [facility];  Service: Urology;  Laterality: Left;  . CYSTOSCOPY/URETEROSCOPY/HOLMIUM LASER/STENT PLACEMENT Left 06/18/2020   Procedure: CYSTOSCOPY/URETEROSCOPY/HOLMIUM LASER/STENT EXCHANGE;  Surgeon: Remi Haggard, MD;  Location: [facility];  Service: Urology;  Laterality: Left;  1 HR  . EYE SURGERY     BILATERAL CATARACT EXTRACTIONS  . GIVENS CAPSULE STUDY N/A 10/07/2015   Procedure: GIVENS CAPSULE STUDY;  Surgeon: Carol Ada, MD;  Location: [facility];  Service: Endoscopy;  Laterality: N/A;   . HAND SURGERY     left  . KNEE SURGERY     right  . LAMINECTOMY     cervical  . LASER PHOTO ABLATION Left 08/19/2017   Procedure: LASER PHOTO ABLATION;  Surgeon: [name], MD;  Location: [facility];  Service: Ophthalmology;  Laterality: Left;  . MEMBRANE PEEL Left 03/22/2017   Procedure: POSSIBLE MEMBRANE PEEL WITH SILICONE OIL AND PERFLURON;  Surgeon: [name], MD;  Location: [facility];  Service: Ophthalmology;  Laterality: Left;  . NEPHRECTOMY     PT STATES RIGHT KIDNEY WAS REMOVED  . PAROTIDECTOMY Right 06/13/2018   Procedure: RIGHT TOTAL PAROTIDECTOMY;  Surgeon: Leta Baptist, MD;  Location: [facility];  Service: ENT;  Laterality: Right;  . PARS PLANA VITRECTOMY Left 03/22/2017   Procedure: PARS PLANA  VITRECTOMY WITH 25 GAUGE;  Surgeon: [name], MD;  Location: [facility];  Service: Ophthalmology;  Laterality: Left;  . PARS PLANA VITRECTOMY W/ SCLERAL BUCKLE Left 08/19/2017   Procedure: 31 GAUGE PARS PLANA VITRECTOMY WITH SILCONE OIL REMOVAL;  Surgeon: [name], MD;  Location: [facility];  Service: Ophthalmology;  Laterality: Left;  . 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 SALIVARY STONE REMOVAL Right 02/21/2019   Procedure: RIGHT PAROTID FISTULA REPAIR;  Surgeon: Leta Baptist, MD;  Location: [facility];  Service: ENT;  Laterality: Right;  . SHOULDER SURGERY  2020   [facility]  . TOTAL KNEE ARTHROPLASTY Left 10/19/2012   Procedure: LEFT TOTAL KNEE ARTHROPLASTY;  Surgeon: Magnus Sinning, MD;  Location[facility];  Service: Orthopedics;  Laterality: Left;  [name] VITRECTOMY 25 GAUGE WITH SCLERAL BUCKLE Left 02/17/2017   Procedure: VITRECTOMY 25 GAUGE WITH SCLERAL BUCKLE membrane peel, injection of silicone oil, and endolaser photocoagulation.;  Surgeon: [name], MD;  Location: [facility];  Service: Ophthalmology;  Laterality: Left;    FAMILY HISTORY Family History  Problem Relation Age of Onset  . Diabetes Mother   . COPD Brother   . COPD Sister   . Heart attack Father   .  Amblyopia Neg Hx   . Blindness Neg Hx   . Cataracts Neg Hx   . Glaucoma Neg Hx   . Macular degeneration Neg Hx   . Retinal detachment Neg Hx   . Retinitis pigmentosa Neg Hx     SOCIAL HISTORY Social History   Tobacco Use  . Smoking status: Former Smoker    Packs/day: 2.00    Years: 40.00    Pack years: 80.00    Types: Cigarettes, Pipe    Quit date: 06/09/1995    Years since quitting: 25.2  . Smokeless tobacco: Never Used  Vaping Use  . Vaping Use: Never used  Substance Use Topics  . Alcohol use: No  . Drug use: No         OPHTHALMIC EXAM:  Base Eye Exam    Visual Acuity (Snellen - Linear)      Right Left   Dist cc 20/25 -2 20/100 -2   Dist ph cc 20/20 -2 20/80 -2   Correction: Glasses       Tonometry (Tonopen, 1:38 PM)      Right Left   Pressure 10 13       Pupils      Dark Light Shape React APD   Right 3 2 Round Brisk None   Left 5 5 Round Minimal None       Visual Fields (Counting fingers)      Left Right    Full Full       Extraocular Movement      Right Left    Full, Ortho Full, Ortho       Neuro/Psych    Oriented x3: Yes   Mood/Affect: Normal       Dilation    Both eyes: 1.0% Mydriacyl, 2.5% Phenylephrine @ 1:38 PM        Slit Lamp and Fundus Exam    External Exam      Right Left   External Normal Periorbital edema improving       Slit Lamp Exam      Right Left   Lids/Lashes Dermatochalasis - upper lid Dermatochalasis - upper lid   Conjunctiva/Sclera White and quiet White and quiet   Cornea Arcus, mild tear film debris, 1+ Punctate epithelial  erosions arcus, tear film debris, trace PEE, mild haze   Anterior Chamber Deep and quiet Deep, quiet   Iris Round and dilated Round and moderately dilated   Lens Three piece Posterior chamber intraocular lens, trace PCO Three piece Posterior chamber intraocular lens in good position, micro condensations on posterior lens surface - stable   Vitreous Vitreous syneresis, PVD Post vitrectomy        Fundus Exam      Right Left   Disc Pink and Sharp, mild Peripapillary atrophy, Compact, mild PPP Sharp rim, mild pallor, temporal Peripapillary atrophy, +cupping   C/D Ratio 0.4 0.6   Macula Flat, good foveal reflex, Retinal pigment epithelial mottling - mild, No heme or edema  flat; Blunted foveal reflex, mild cystic changes temporal macula - slightly improved   Vessels Vascular attenuation, Tortuous Vascular attenuation, mild tortuosity   Periphery Attached, mild Reticular degeneration; no RT or RD Attached, moderate buckle height; temporal/inf retinectomy from 2-630 with peripheral edge attached with good laser at the edge; trace areas of fibrosis inferiorly and superior temporal -- surrounded by good laser          IMAGING AND PROCEDURES  Imaging and Procedures for 09/19/17  OCT, Retina - OU - Both Eyes       Right Eye Quality was good. Central Foveal Thickness: 248. Progression has been stable. Findings include normal foveal contour, no IRF, no SRF (Partial PVD).   Left Eye Quality was good. Central Foveal Thickness: 251. Progression has improved. Findings include no SRF, epiretinal membrane, intraretinal fluid, abnormal foveal contour (Patchy ORA, interval improvement in IRF/CME temporal macula).   Notes Images taken, stored on drive  Diagnosis / Impression:  OD: NFP, No IRF, No SRF OS: retina attached with oil out - Patchy ORA; interval improvement in IRF/CME temporal macula  Clinical management:  See below  Abbreviations: NFP - Normal foveal profile. CME - cystoid macular edema. PED - pigment epithelial detachment. IRF - intraretinal fluid. SRF - subretinal fluid. EZ - ellipsoid zone. ERM - epiretinal membrane. ORA - outer retinal atrophy. ORT - outer retinal tubulation. SRHM - subretinal hyper-reflective material                  ASSESSMENT/PLAN:    ICD-10-CM   1. Retinal detachment, left  H33.22   2. 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 Cystoid macular edema of left eye  H35.352 prednisoLONE acetate (PRED FORTE) 1 % ophthalmic suspension    ketorolac (ACULAR) 0.5 % ophthalmic solution  4. Retinal edema  H35.81 OCT, Retina - OU - Both Eyes  5. Pseudophakia, both eyes  Z96.1   6. Corneal edema of left eye  H18.20     1-4. 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23.20 to 03.23.21 due to new diagnosis of motor neuron disease/ALS  - retina attached and in  good position  - s/p STK OS #1 (07.14.20), #2 (3.23.21), #3 (06.21.21), #4 (10.7.21)  - currently on PF QID and Ketorolac QID OS for CME  - OCT today shows mild interval improvement in focal IRF/CME temporal macula OS  - BCVA remains stable at 20/80-2 OS  - recommend holding off on STK today -- continue drops  - corneal edema slightly improved-- stay on muro 128 OS  - continue PF QID and ketorolac QID OS  - IOP 13 today -- good off Alphagan P  - f/u 4-5 months for DFE, OCT  5. Pseudophakia OU  - IOLs in good position OU  - monitor  6. Corneal edema OS -- improved  - persistent post op corneal edema OS as above  - cont Muro 128 BID as above   Ophthalmic Meds Ordered this visit:  Meds ordered this encounter  Medications  . prednisoLONE acetate (PRED FORTE) 1 % ophthalmic suspension    Sig: Place 1 drop into the left eye 4 (four) times daily.    Dispense:  10 mL     Refill:  4  . ketorolac (ACULAR) 0.5 % ophthalmic solution    Sig: Place 1 drop into the left eye 4 (four) times daily.    Dispense:  10 mL    Refill:  4       Return for 4-5 months - h/o RD OS, CME OS -- DFE, OCT.  There are no Patient Instructions on file for this visit.   Explained the diagnoses, plan, and follow up with the patient and they expressed understanding.  Patient expressed understanding of the importance of proper follow up care.  This document serves as a record of services personally performed by Gardiner Sleeper, MD, PhD. It was created on their behalf by Estill Bakes, COT an ophthalmic technician. The creation of this record is the provider's dictation and/or activities during the visit.    Electronically signed by: Estill Bakes, COT 3.21.22 @ 2:17 PM   This document serves as a record of services personally performed by Gardiner Sleeper, MD, PhD. It was created on their behalf by Roselee Nova, COMT. The creation of this record is the provider's dictation and/or activities during the visit.  Electronically signed by: Roselee Nova, COMT 08/27/20 2:17 PM  Gardiner Sleeper, M.D., Ph.D. Diseases & Surgery of the Retina and [place] 08/27/2020   I have reviewed the above documentation for accuracy and completeness, and I agree with the above. Gardiner Sleeper, M.D., Ph.D. 08/27/20 2:17 PM  Abbreviations: M myopia (nearsighted); A astigmatism; H hyperopia (farsighted); P presbyopia; Mrx spectacle prescription;  CTL contact lenses; OD right eye; OS left eye; OU both eyes  XT exotropia; ET esotropia; PEK punctate epithelial keratitis; PEE punctate epithelial erosions; DES dry eye syndrome; MGD meibomian gland dysfunction; ATs artificial tears; PFAT's preservative free artificial tears; [place] nuclear sclerotic cataract; PSC posterior subcapsular cataract; ERM epi-retinal membrane; PVD posterior vitreous detachment; RD retinal detachment; DM  diabetes mellitus; DR diabetic retinopathy; NPDR non-proliferative diabetic retinopathy; PDR proliferative diabetic retinopathy; CSME clinically significant macular edema; DME diabetic macular edema; dbh dot blot hemorrhages; CWS cotton wool spot; POAG primary open angle glaucoma; C/D cup-to-disc ratio; HVF humphrey visual field; GVF goldmann visual field; OCT optical coherence tomography; IOP intraocular pressure; BRVO Branch retinal vein occlusion; CRVO central retinal vein occlusion; CRAO central retinal artery occlusion; BRAO branch retinal artery occlusion; RT retinal tear; SB scleral buckle; PPV pars plana vitrectomy;  VH Vitreous hemorrhage; PRP panretinal laser photocoagulation; IVK intravitreal kenalog; VMT vitreomacular traction; MH Macular hole;  NVD neovascularization of the disc; NVE neovascularization elsewhere; AREDS age related eye disease study; ARMD age related macular degeneration; POAG primary open angle glaucoma; EBMD epithelial/anterior basement membrane dystrophy; ACIOL anterior chamber intraocular lens; IOL intraocular lens; PCIOL posterior chamber intraocular lens; Phaco/IOL phacoemulsification with intraocular lens placement; [place] photorefractive keratectomy; LASIK laser assisted in situ keratomileusis; HTN hypertension; DM diabetes mellitus; COPD chronic obstructive pulmonary disease

## 2020-08-27 ENCOUNTER — Ambulatory Visit (INDEPENDENT_AMBULATORY_CARE_PROVIDER_SITE_OTHER): Payer: Medicare HMO | Admitting: Ophthalmology

## 2020-08-27 ENCOUNTER — Encounter (INDEPENDENT_AMBULATORY_CARE_PROVIDER_SITE_OTHER): Payer: Self-pay | Admitting: Ophthalmology

## 2020-08-27 ENCOUNTER — Other Ambulatory Visit: Payer: Self-pay

## 2020-08-27 DIAGNOSIS — H1820 Unspecified corneal edema: Secondary | ICD-10-CM

## 2020-08-27 DIAGNOSIS — H3522 Other non-diabetic proliferative retinopathy, left eye: Secondary | ICD-10-CM | POA: Diagnosis not present

## 2020-08-27 DIAGNOSIS — H35352 Cystoid macular degeneration, left eye: Secondary | ICD-10-CM | POA: Diagnosis not present

## 2020-08-27 DIAGNOSIS — H3581 Retinal edema: Secondary | ICD-10-CM

## 2020-08-27 DIAGNOSIS — H3322 Serous retinal detachment, left eye: Secondary | ICD-10-CM | POA: Diagnosis not present

## 2020-08-27 DIAGNOSIS — Z961 Presence of intraocular lens: Secondary | ICD-10-CM | POA: Diagnosis not present

## 2020-08-27 MED ORDER — KETOROLAC TROMETHAMINE 0.5 % OP SOLN: 1.0000 [drp] | Freq: Four times a day (QID) | OPHTHALMIC | 4 refills | Status: AC

## 2020-08-27 MED ORDER — PREDNISOLONE ACETATE 1 % OP SUSP: 1.0000 [drp] | Freq: Four times a day (QID) | OPHTHALMIC | 4 refills | Status: AC

## 2020-08-30 DIAGNOSIS — J9601 Acute respiratory failure with hypoxia: Secondary | ICD-10-CM | POA: Diagnosis not present

## 2020-09-02 DIAGNOSIS — G1221 Amyotrophic lateral sclerosis: Secondary | ICD-10-CM | POA: Diagnosis not present

## 2020-09-06 ENCOUNTER — Ambulatory Visit (INDEPENDENT_AMBULATORY_CARE_PROVIDER_SITE_OTHER): Payer: Medicare HMO | Admitting: Urology

## 2020-09-06 ENCOUNTER — Other Ambulatory Visit: Payer: Self-pay

## 2020-09-06 ENCOUNTER — Encounter: Payer: Self-pay | Admitting: Urology

## 2020-09-06 VITALS — BP 127/74 | HR 88 | Temp 97.9°F | Ht 73.0 in | Wt 164.0 lb

## 2020-09-06 DIAGNOSIS — N3001 Acute cystitis with hematuria: Secondary | ICD-10-CM | POA: Diagnosis not present

## 2020-09-06 LAB — URINALYSIS, ROUTINE W REFLEX MICROSCOPIC
Bilirubin, UA: NEGATIVE
Glucose, UA: NEGATIVE
Ketones, UA: NEGATIVE
Nitrite, UA: NEGATIVE
Specific Gravity, UA: 1.02 (ref 1.005–1.030)
Urobilinogen, Ur: 1 mg/dL (ref 0.2–1.0)
pH, UA: 6.5 (ref 5.0–7.5)

## 2020-09-06 LAB — MICROSCOPIC EXAMINATION

## 2020-09-06 NOTE — Patient Instructions (Signed)

## 2020-09-06 NOTE — Progress Notes (Signed)
09/06/2020 11:02 AM   Jerome Mays [birth year] [id number]  Referring provider: [name], MD [address] [place],  [place] [address]  followup nephrolithiasis  HPI: Jerome Mays is a 80yo here for followup for nephrolithiasis. He is doing well after ureteral stent removal. Renal US shows 3 left lower pole calculi., He denies any flank pain. No worsening LUTS   PMH: Past Medical History:  Diagnosis Date  . ALS (amyotrophic lateral sclerosis) ([place]) 2021  . Anemia    low iron  . Arthritis   . Atypical chest pain 01/04/2020  . Colonic polyp   . Constipation   . COPD (chronic obstructive pulmonary disease) ([place])   . Coronary artery calcification seen on CAT scan 01/04/2020  . GERD (gastroesophageal reflux disease)   . Gout   . History of kidney stones   . Kidney failure   . Malignant tumor of kidney ([place])   . Muscle atrophy   . Pain    LOWER BACK AND LEFT HIP - HX OF PREVIOUS LUMBAR AND CERVICAL SURGERY--PT STATES HE HAS HAD NUMBNESS IN BOTH FEET SINCE HIS BACK SURGERY  . Pure hypercholesterolemia 01/04/2020  . Renal calculus   . Retinal detachment   . Shortness of breath 01/04/2020    Surgical History: Past Surgical History:  Procedure Laterality Date  . BACK SURGERY     LUMBAR SURGERY  . CATARACT EXTRACTION Bilateral 2003   Surgeon unknown  . CYSTOSCOPY W/ URETERAL STENT PLACEMENT Left 05/30/2020   Procedure: CYSTOSCOPY URETERAL STENT PLACEMENT;  Surgeon: Remi Haggard, MD;  Location: [facility];  Service: Urology;  Laterality: Left;  . CYSTOSCOPY/URETEROSCOPY/HOLMIUM LASER/STENT PLACEMENT Left 06/18/2020   Procedure: CYSTOSCOPY/URETEROSCOPY/HOLMIUM LASER/STENT EXCHANGE;  Surgeon: Remi Haggard, MD;  [facility];  Service: Urology;  Laterality: Left;  1 HR  . EYE SURGERY     BILATERAL CATARACT EXTRACTIONS  . GIVENS CAPSULE STUDY N/A 10/07/2015   Procedure: GIVENS CAPSULE STUDY;  Surgeon: Carol Ada, MD;  Location: [place];  Service: Endoscopy;   Laterality: N/A;  . HAND SURGERY     left  . KNEE SURGERY     right  . LAMINECTOMY     cervical  . LASER PHOTO ABLATION Left 08/19/2017   Procedure: LASER PHOTO ABLATION;  Surgeon: [name], MD;  Location: [place];  Service: Ophthalmology;  Laterality: Left;  . MEMBRANE PEEL Left 03/22/2017   Procedure: POSSIBLE MEMBRANE PEEL WITH SILICONE OIL AND PERFLURON;  Surgeon: [name], MD;  Location: [place];  Service: Ophthalmology;  Laterality: Left;  . NEPHRECTOMY     PT STATES RIGHT KIDNEY WAS REMOVED  . PAROTIDECTOMY Right 06/13/2018   Procedure: RIGHT TOTAL PAROTIDECTOMY;  Surgeon: Leta Baptist, MD;  Location: [place];  Service: ENT;  Laterality: Right;  . PARS PLANA VITRECTOMY Left 03/22/2017   Procedure: PARS PLANA VITRECTOMY WITH 25 GAUGE;  Surgeon: [name], MD;  Location: [place];  Service: Ophthalmology;  Laterality: Left;  . PARS PLANA VITRECTOMY W/ SCLERAL BUCKLE Left 08/19/2017   Procedure: 95 GAUGE PARS PLANA VITRECTOMY WITH SILCONE OIL REMOVAL;  Surgeon: [name], MD;  Location: [place];  Service: Ophthalmology;  Laterality: Left;  . 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 SALIVARY STONE REMOVAL Right 02/21/2019   Procedure: RIGHT PAROTID FISTULA REPAIR;  Surgeon: Leta Baptist, MD;  Location: [place];  Service: ENT;  Laterality: Right;  . SHOULDER SURGERY  2020   [facility]  . TOTAL KNEE ARTHROPLASTY Left 10/19/2012  Procedure: LEFT TOTAL KNEE ARTHROPLASTY;  Surgeon: Magnus Sinning, MD;  [facility];  Service: Orthopedics;  Laterality: Left;  [name] VITRECTOMY 25 GAUGE WITH SCLERAL BUCKLE Left 02/17/2017   Procedure: VITRECTOMY 25 GAUGE WITH SCLERAL BUCKLE membrane peel, injection of silicone oil, and endolaser photocoagulation.;  Surgeon: [name], MD;  Location: [place];  Service: Ophthalmology;  Laterality: Left;    Home Medications:  Allergies as of 09/06/2020      Reactions   Lipitor [atorvastatin] Rash, Other (See Comments)   Passed out       Medication List       Accurate as of September 06, 2020 11:02 AM. If you have any questions, ask your nurse or doctor.        ACCRUFeR 30 MG Caps Generic drug: Ferric Maltol   allopurinol 300 MG tablet Commonly known as: ZYLOPRIM Take 300 mg by mouth daily.   apixaban 5 MG Tabs tablet Commonly known as: ELIQUIS Take 1 tablet (5 mg total) by mouth 2 (two) times daily.   aspirin 81 MG EC tablet   b complex vitamins tablet Take 1 tablet by mouth daily.   CeraVe Itch Relief 1 % Lotn Generic drug: pramoxine Apply 1 application topically 2 (two) times daily as needed (itching/dry skin.).   clobetasol 0.05 % external solution Commonly known as: TEMOVATE Apply 1 application topically daily as needed.   gabapentin 300 MG capsule Commonly known as: NEURONTIN Take 300 mg by mouth 2 (two) times daily.   guaiFENesin 600 MG 12 hr tablet Commonly known as: MUCINEX Take by mouth.   hydrOXYzine 10 MG tablet Commonly known as: ATARAX/VISTARIL See admin instructions.   ketoconazole 2 % shampoo Commonly known as: NIZORAL Apply 1 application topically every other day.   ketorolac 0.5 % ophthalmic solution Commonly known as: ACULAR INSTILL 1 DROP INTO LEFT EYE 4 TIMES DAILY   ketorolac 0.5 % ophthalmic solution Commonly known as: ACULAR Place 1 drop into the left eye 4 (four) times daily.   loteprednol 0.5 % ophthalmic suspension Commonly known as: LOTEMAX   mirtazapine 7.5 MG tablet Commonly known as: REMERON Take 7.5 mg by mouth at bedtime.   multivitamin with minerals Tabs tablet Take 1 tablet by mouth daily.   NON FORMULARY   nystatin-triamcinolone cream Commonly known as: MYCOLOG II SMARTSIG:Sparingly Topical 2-3 Times Daily PRN   pantoprazole 40 MG tablet Commonly known as: PROTONIX 1 tablet   prednisoLONE acetate 1 % ophthalmic suspension Commonly known as: PRED FORTE Place 1 drop into the left eye in the morning and at bedtime.   prednisoLONE acetate 1  % ophthalmic suspension Commonly known as: PRED FORTE Place 1 drop into the left eye 4 (four) times daily.   riluzole 50 MG tablet Commonly known as: Rilutek Take 1 tablet (50 mg total) by mouth every 12 (twelve) hours.   sodium chloride 5 % ophthalmic solution Commonly known as: MURO 128 Place 1 drop into the left eye in the morning and at bedtime.   Trelegy Ellipta 200-62.5-25 MCG/INH Aepb Generic drug: Fluticasone-Umeclidin-Vilant Inhale 1 puff by mouth once daily   triamcinolone 0.1 % Commonly known as: KENALOG Apply 1 application topically 2 (two) times daily as needed (skin irritation.).   Vitamin D3 50 MCG (2000 UT) Tabs Take by mouth.   Vitamin D3 10 MCG (400 UNIT) Caps       Allergies:  Allergies  Allergen Reactions  . Lipitor [Atorvastatin] Rash and Other (See Comments)    Passed out  Family History: Family History  Problem Relation Age of Onset  . Diabetes Mother   . COPD Brother   . COPD Sister   . Heart attack Father   . Amblyopia Neg Hx   . Blindness Neg Hx   . Cataracts Neg Hx   . Glaucoma Neg Hx   . Macular degeneration Neg Hx   . Retinal detachment Neg Hx   . Retinitis pigmentosa Neg Hx     Social History:  reports that he quit smoking about 25 years ago. His smoking use included cigarettes and pipe. He has a 80.00 pack-year smoking history. He has never used smokeless tobacco. He reports that he does not drink alcohol and does not use drugs.  ROS: All other review of systems were reviewed and are negative except what is noted above in HPI  Physical Exam: BP 127/74   Pulse 88   Temp 97.9 F (36.6 C) (Oral)   Ht 6\' 1"  (1.854 m)   Wt 164 lb (74.4 kg)   BMI 21.64 kg/m   Constitutional:  Alert and oriented, No acute distress. HEENT: [place] AT, moist mucus membranes.  Trachea midline, no masses. Cardiovascular: No clubbing, cyanosis, or edema. Respiratory: Normal respiratory effort, no increased work of breathing. GI: Abdomen is soft,  nontender, nondistended, no abdominal masses GU: No CVA tenderness.  Lymph: No cervical or inguinal lymphadenopathy. Skin: No rashes, bruises or suspicious lesions. Neurologic: Grossly intact, no focal deficits, moving all 4 extremities. Psychiatric: Normal mood and affect.  Laboratory Data: Lab Results  Component Value Date   WBC 6.5 06/26/2020   HGB 8.3 (L) 06/26/2020   HCT 28.3 (L) 06/26/2020   MCV 90.7 06/26/2020   PLT 218 06/26/2020    Lab Results  Component Value Date   CREATININE 0.80 06/26/2020    No results found for: PSA  No results found for: TESTOSTERONE  No results found for: HGBA1C  Urinalysis    Component Value Date/Time   COLORURINE RED (A) 06/23/2020 1551   APPEARANCEUR Cloudy (A) 07/10/2020 0923   LABSPEC 1.013 06/23/2020 1551   PHURINE 7.0 06/23/2020 1551   GLUCOSEU Trace (A) 07/10/2020 0923   HGBUR LARGE (A) 06/23/2020 1551   BILIRUBINUR Negative 07/10/2020 0923   KETONESUR NEGATIVE 06/23/2020 1551   PROTEINUR 3+ (A) 07/10/2020 0923   PROTEINUR 100 (A) 06/23/2020 1551   NITRITE Positive (A) 07/10/2020 0923   NITRITE NEGATIVE 06/23/2020 1551   LEUKOCYTESUR 3+ (A) 07/10/2020 0923   LEUKOCYTESUR MODERATE (A) 06/23/2020 1551    Lab Results  Component Value Date   LABMICR See below: 07/10/2020   WBCUA >30 (A) 07/10/2020   LABEPIT 0-10 07/10/2020   MUCUS Present 07/10/2020   BACTERIA Many (A) 07/10/2020    Pertinent Imaging: Renal US 08/23/2020: Images reviewed and discussed with the patient No results found for this or any previous visit.  No results found for this or any previous visit.  No results found for this or any previous visit.  No results found for this or any previous visit.  Results for orders placed during the hospital encounter of 08/23/20  Ultrasound renal complete  Narrative CLINICAL DATA:  Nephrolithiasis, status post right nephrectomy for cancer  EXAM: RENAL / URINARY TRACT ULTRASOUND COMPLETE  COMPARISON:   CT abdomen/pelvis dated 06/23/2020  FINDINGS: Right Kidney:  Surgically absent.  Left Kidney:  Renal measurements: 14.1 x 6.2 x 6.6 cm = volume: 302 mL. Echogenicity within normal limits. 1.9 x 1.5 x 1.5 cm lower pole cyst, simple.  At least 3 lower pole calculi measuring up to 7 mm. No hydronephrosis.  Bladder:  Appears normal for degree of bladder distention.  Other:  Incidentally noted is the reservoir for the patient's penile prosthesis in the left lower quadrant.  IMPRESSION: Status post right nephrectomy.  Three left lower pole renal calculi measuring up to 7 mm. No hydronephrosis.  1.9 cm simple left lower pole renal cyst, benign.   Electronically Signed By: Julian Hy M.D. On: 08/26/2020 09:35  No results found for this or any previous visit.  No results found for this or any previous visit.  No results found for this or any previous visit.   Assessment & Plan:    1. Nephrolithiasis -RTC 6 months with renal US - Urinalysis, Routine w reflex microscopic - Urine Culture   No follow-ups on file.  Nicolette Bang, MD  [facility]rology [place]

## 2020-09-06 NOTE — Progress Notes (Signed)
Urological Symptom Review  Patient is experiencing the following symptoms: Hard to postpone urination Get up at night to urinate    Review of Systems  Gastrointestinal (upper)  : Negative for upper GI symptoms  Gastrointestinal (lower) : Negative for lower GI symptoms  Constitutional : Negative for symptoms  Skin: Itching  Eyes: Negative for eye symptoms  Ear/Nose/Throat : Sinus problems  Hematologic/Lymphatic: Negative for Hematologic/Lymphatic symptoms  Cardiovascular : Negative for cardiovascular symptoms  Respiratory : Shortness of breath  Endocrine: Negative  Musculoskeletal: Negative for musculoskeletal symptoms  Neurological: Negative for neurological symptoms  Psychologic: Negative for psychiatric symptoms

## 2020-09-09 ENCOUNTER — Telehealth: Payer: Self-pay

## 2020-09-09 DIAGNOSIS — G1221 Amyotrophic lateral sclerosis: Secondary | ICD-10-CM | POA: Diagnosis not present

## 2020-09-09 LAB — URINE CULTURE: Organism ID, Bacteria: NO GROWTH

## 2020-09-09 NOTE — Telephone Encounter (Signed)
Pts wife called asking for a copy of last renal CT be sent to Dr. Tresa Endo at [facility] office. I sent it and wife notified.

## 2020-09-11 ENCOUNTER — Other Ambulatory Visit: Payer: Self-pay | Admitting: *Deleted

## 2020-09-11 NOTE — Patient Outreach (Signed)
[facility]) Care Management  09/11/2020  FLEETWOOD PIERRON 1941-02-19 [id number]   Outgoing call placed to member/wife on both home phone and mobile, no answer, unable to leave voice message.  Will follow up within the next 3-4 business days.  Valente David, [place], MSN [place] [phone]

## 2020-09-17 ENCOUNTER — Other Ambulatory Visit: Payer: Self-pay | Admitting: *Deleted

## 2020-09-17 NOTE — Patient Outreach (Signed)
[facility]) Care Management  09/17/2020  Jerome Mays [birth year] [id number]   Outreach attempt #2, unsuccessful, unable to leave voice message.  Will send outreach letter and follow up within the next 3-4 business days.  Valente David, [place], MSN [place] [phone]

## 2020-09-23 ENCOUNTER — Other Ambulatory Visit: Payer: Self-pay | Admitting: *Deleted

## 2020-09-23 NOTE — Patient Outreach (Signed)
Jerome Mays) Care Management  [place]  09/24/2020   Jerome Mays [birth year] [id number]   Outreach attempt #3 to wife, successful.  State he is dong the "same" and managing daily.  Was seen by urology and pulmonology since last outreach, no issues noted.  Denies any urgent concerns, encouraged to contact this care manager with questions.   Encounter Medications:  Outpatient Encounter Medications as of 09/23/2020  Medication Sig Note  . allopurinol (ZYLOPRIM) 300 MG tablet Take 300 mg by mouth daily.   [name] apixaban (ELIQUIS) 5 MG TABS tablet Take 1 tablet (5 mg total) by mouth 2 (two) times daily.   [name] aspirin 81 MG EC tablet    . b complex vitamins tablet Take 1 tablet by mouth daily. 06/17/2020: On hold due to upcoming procedure.  . Cholecalciferol (VITAMIN D3) 10 MCG (400 UNIT) CAPS    . Cholecalciferol (VITAMIN D3) 50 MCG (2000 UT) TABS Take by mouth. 06/17/2020: On hold due to upcoming procedure.   . clobetasol (TEMOVATE) 0.05 % external solution Apply 1 application topically daily as needed.   . Ferric Maltol (ACCRUFER) 30 MG CAPS    . gabapentin (NEURONTIN) 300 MG capsule Take 300 mg by mouth 2 (two) times daily.   [name] guaiFENesin (MUCINEX) 600 MG 12 hr tablet Take by mouth.   . hydrOXYzine (ATARAX/VISTARIL) 10 MG tablet See admin instructions.   [name] ketoconazole (NIZORAL) 2 % shampoo Apply 1 application topically every other day.   . ketorolac (ACULAR) 0.5 % ophthalmic solution INSTILL 1 DROP INTO LEFT EYE 4 TIMES DAILY   . ketorolac (ACULAR) 0.5 % ophthalmic solution Place 1 drop into the left eye 4 (four) times daily.   [name] loteprednol (LOTEMAX) 0.5 % ophthalmic suspension    . mirtazapine (REMERON) 7.5 MG tablet Take 7.5 mg by mouth at bedtime.   . Multiple Vitamin (MULTIVITAMIN WITH MINERALS) TABS tablet Take 1 tablet by mouth daily. 06/17/2020: On hold due to upcoming procedure.   . NON FORMULARY    . nystatin-triamcinolone (MYCOLOG II) cream  SMARTSIG:Sparingly Topical 2-3 Times Daily PRN   . pantoprazole (PROTONIX) 40 MG tablet 1 tablet   . pramoxine (CERAVE ITCH RELIEF) 1 % LOTN Apply 1 application topically 2 (two) times daily as needed (itching/dry skin.).   [name] prednisoLONE acetate (PRED FORTE) 1 % ophthalmic suspension Place 1 drop into the left eye in the morning and at bedtime.   . prednisoLONE acetate (PRED FORTE) 1 % ophthalmic suspension Place 1 drop into the left eye 4 (four) times daily.   . riluzole (RILUTEK) 50 MG tablet Take 1 tablet (50 mg total) by mouth every 12 (twelve) hours.   . sodium chloride (MURO 128) 5 % ophthalmic solution Place 1 drop into the left eye in the morning and at bedtime.   . TRELEGY ELLIPTA 200-62.5-25 MCG/INH AEPB Inhale 1 puff by mouth once daily   . triamcinolone (KENALOG) 0.1 % Apply 1 application topically 2 (two) times daily as needed (skin irritation.).    No facility-administered encounter medications on file as of 09/23/2020.    Functional Status:  In your present state of health, do you have any difficulty performing the following activities: 06/24/2020 06/18/2020  Hearing? [place]? 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 N N  Walking or climbing stairs? Y Y  Comment - -  Dressing or bathing? Jerome Mays  N  Comment - -  Doing errands, shopping? Y -  Some recent data might be hidden    Fall/Depression Screening: Fall Risk  06/10/2020  Falls in the past year? 1  Number falls in past yr: 0  Injury with Fall? 0  Risk for fall due to : History of fall(s);Impaired vision;Medication side effect;Impaired mobility;Impaired balance/gait  Follow up Falls evaluation completed;Education provided;Falls prevention discussed   PHQ 2/9 Scores 06/10/2020  PHQ - 2 Score 0    Assessment:  Goals Addressed            This Visit's Progress   . [facility]) Learn More About My Health   On track    Timeframe:  Long-Range  Goal Priority:  Medium Start Date: 06/10/2020                            Expected End Date:  10/05/20                      Follow Up Date 09/10/2020    - make a list of questions - ask questions - repeat what I heard to make sure I understand  -continue to monitor urine for hematuria and notify provider for change or worsening -continue to monitor for shortness of breath or hemoptysis    Why is this important?    The best way to learn about your health and care is by talking to the doctor and nurse.   They will answer your questions and give you information in the way that you like best.    Notes:   2/3 - Reviewed current symptoms, encouraged to contact PCP regarding GI concern  3/8 - Education regarding recovery from PE and kidney stones sent  4/18 - Confirmed no further issues with kidney stones, denies shortness of breath from PE, continues taking blood thinners for intervention    . COMPLETED: Jerome Mays) Make and Keep All Appointments   On track    Timeframe:  Short-Term Goal Priority:  High Start Date:  3/8                           Expected End Date:  4/8                  Follow Up Date 4/5   - ask family or friend for a ride - call to cancel if needed - keep a calendar with appointment dates  -request assistance from family for transportation to out of town appointments -schedule post hospital follow up with PCP -attend scheduled post hospital appointment with urologist on 06/17/2020 at 2:45   Why is this important?    Part of staying healthy is seeing the doctor for follow-up care.   If you forget your appointments, there are some things you can do to stay on track.    Notes:   2/3 - Reviewed upcoming appointments, encouraged to keep and attend  3/8 - Discussed upcoming appointments - today for radiology, pulmonology 3/15 and urology 3/18  4/18 - All follow up appointments complete, next ones scheduled for June (PCP, [facility], Hematology, and Urology)    .  COMPLETED: [facility] - Manage My Diet   On track    Timeframe:  Short-Term Goal Priority:  Medium Start Date:       07/11/2020  Expected End Date:       08/08/2020                Follow Up Date 08/08/2020   - ask for help if I have trouble affording healthy foods - choose foods low in fat and sugar - choose foods that are low in sodium (salt) - keep a food diary - keep healthy snacks on hand - track weight in diary - weigh myself daily    Why is this important?    A healthy diet is important for mental and physical health.   Healthy food helps repair damaged body tissue and maintains strong bones and muscles.   No single food is just right so eating a variety of proteins, fruits, vegetables and grains is best.   You may need to change what you eat or drink to manage kidney disease.   A dietitian is the best person to guide you.     Notes:   4/18 - Wife report member is able to tolerate recommended diet without nausea/vomiting.       Plan:  Follow-up:  Patient agrees to Care Plan and Follow-up.  Will follow up within the next month.  Valente David, [place], MSN [place] [phone]

## 2020-09-30 DIAGNOSIS — J9601 Acute respiratory failure with hypoxia: Secondary | ICD-10-CM | POA: Diagnosis not present

## 2020-10-03 DIAGNOSIS — G1221 Amyotrophic lateral sclerosis: Secondary | ICD-10-CM | POA: Diagnosis not present

## 2020-10-21 ENCOUNTER — Other Ambulatory Visit: Payer: Self-pay | Admitting: *Deleted

## 2020-10-21 NOTE — Patient Outreach (Signed)
[facility]) Care Management  10/21/2020  [name] 04-13-1941 [id number]   Outgoing call placed to member/wife, no answer, HIPAA compliant voice message left.  Will follow up within the next 3-4 business days.  Jerome Mays, [place], MSN [place] [phone]

## 2020-10-23 ENCOUNTER — Telehealth: Payer: Self-pay | Admitting: Pulmonary Disease

## 2020-10-23 NOTE — Telephone Encounter (Signed)
Spoke with the pt's spouse, [name], ok per DPR  She states [employer] told her they needed Dr Vaughan Browner to sign a form to continue o2  Vallarie Mare, have you received this CMN? Thanks!

## 2020-10-24 ENCOUNTER — Other Ambulatory Visit: Payer: Self-pay | Admitting: *Deleted

## 2020-10-24 NOTE — Telephone Encounter (Signed)
Spoke to [employer] with [employer] and requested CMN to be faxed to our office.   Patient's spouse, Jerome Mays(DPR) is aware and voiced her understanding.

## 2020-10-24 NOTE — Patient Outreach (Signed)
[facility]) Care Management  10/24/2020  Jerome Mays June 28, 1940 [id number]   Outreach attempt #2, successful to wife.  State member continues to do well, no recent complications.  Concern now for ability to perform oral care.  He is not able to hold toothbrush and she is working to do it for him.  Will send education around oral care for ALS patients, if continue to be a concern will request OT referral.  Denies any urgent concerns, encouraged to contact this care manager with questions.  Agrees to follow up within the next month.  Goals Addressed            This Visit's Progress   . [facility]) Learn More About My Health   On track    Timeframe:  Long-Range Goal Priority:  Medium Start Date:  5/19                            Expected End Date:  8/19             Barriers: Knowledge  Follow Up Date 6/17   - make a list of questions - ask questions - repeat what I heard to make sure I understand  -continue to monitor urine for hematuria and notify provider for change or worsening -continue to monitor for shortness of breath or hemoptysis    Why is this important?    The best way to learn about your health and care is by talking to the doctor and nurse.   They will answer your questions and give you information in the way that you like best.    Notes:   2/3 - Reviewed current symptoms, encouraged to contact PCP regarding GI concern  3/8 - Education regarding recovery from PE and kidney stones sent  4/18 - Confirmed no further issues with kidney stones, denies shortness of breath from PE, continues taking blood thinners for intervention  5/19 - Wife expressing concern regarding dental hygiene as member's ALS progresses.  Does not have any adaptive equipment, will send education with interventions on how to perform oral care for ALS patients    . [facility]) Make and Keep All Appointments       Timeframe:  Short-Term Goal Priority:  High Start Date:  5/19          Expected End Date:  6/19            Follow Up Date 6/17  Barriers: Knowledge    - ask family or friend for a ride - call to cancel if needed - keep a calendar with appointment dates  -request assistance from family for transportation to out of town appointments -schedule post hospital follow up with PCP -attend scheduled post hospital appointment with urologist on 06/17/2020 at 2:45   Why is this important?    Part of staying healthy is seeing the doctor for follow-up care.   If you forget your appointments, there are some things you can do to stay on track.    Notes:   2/3 - Reviewed upcoming appointments, encouraged to keep and attend  3/8 - Discussed upcoming appointments - today for radiology, pulmonology 3/15 and urology 3/18  4/18 - All follow up appointments complete, next ones scheduled for June (PCP, [place], Hematology, and Urology)  5/19 - upcoming appointments at [facility] on 6/14, with PCP on 6/10, and hematology on 6/23      Valente David, RN, MSN [facility] Care Management  [facility] Care Manager [phone]

## 2020-10-24 NOTE — Telephone Encounter (Signed)
I have not received a CMN on this patient at all

## 2020-10-26 ENCOUNTER — Other Ambulatory Visit: Payer: Self-pay | Admitting: Pulmonary Disease

## 2020-10-30 DIAGNOSIS — J9601 Acute respiratory failure with hypoxia: Secondary | ICD-10-CM | POA: Diagnosis not present

## 2020-11-01 NOTE — Telephone Encounter (Signed)
I still haven't received a CMN yet

## 2020-11-02 DIAGNOSIS — G1221 Amyotrophic lateral sclerosis: Secondary | ICD-10-CM | POA: Diagnosis not present

## 2020-11-12 NOTE — Telephone Encounter (Signed)
[phone] This is the number I have on my card

## 2020-11-12 NOTE — Telephone Encounter (Signed)
Jerome Mare, do you have [employer]'s number to re-request this CMN?

## 2020-11-12 NOTE — Telephone Encounter (Signed)
Called [employer] and asked them to refax the CMN for this patient. I provided them with Chan's fax number of [phone].

## 2020-11-18 NOTE — Telephone Encounter (Signed)
I still have not received this yet did it get to the doctor's box somehow

## 2020-11-18 NOTE — Telephone Encounter (Signed)
I got it im taking it to Dr Vaughan Browner to sign

## 2020-11-18 NOTE — Telephone Encounter (Signed)
Checked Dr Matilde Bash mail and did not see CMN on this pt  I called [employer] and spoke with Vallarie Mare  She states that she will refax the CMN to [facility] fax [phone]

## 2020-11-19 DIAGNOSIS — J984 Other disorders of lung: Secondary | ICD-10-CM | POA: Diagnosis not present

## 2020-11-19 DIAGNOSIS — J449 Chronic obstructive pulmonary disease, unspecified: Secondary | ICD-10-CM | POA: Diagnosis not present

## 2020-11-19 DIAGNOSIS — Z9181 History of falling: Secondary | ICD-10-CM | POA: Diagnosis not present

## 2020-11-19 DIAGNOSIS — R0602 Shortness of breath: Secondary | ICD-10-CM | POA: Diagnosis not present

## 2020-11-19 DIAGNOSIS — G709 Myoneural disorder, unspecified: Secondary | ICD-10-CM | POA: Diagnosis not present

## 2020-11-19 DIAGNOSIS — Z741 Need for assistance with personal care: Secondary | ICD-10-CM | POA: Diagnosis not present

## 2020-11-19 DIAGNOSIS — Z5181 Encounter for therapeutic drug level monitoring: Secondary | ICD-10-CM | POA: Diagnosis not present

## 2020-11-19 DIAGNOSIS — Z7189 Other specified counseling: Secondary | ICD-10-CM | POA: Diagnosis not present

## 2020-11-19 DIAGNOSIS — M6281 Muscle weakness (generalized): Secondary | ICD-10-CM | POA: Diagnosis not present

## 2020-11-19 DIAGNOSIS — G1221 Amyotrophic lateral sclerosis: Secondary | ICD-10-CM | POA: Diagnosis not present

## 2020-11-19 DIAGNOSIS — R1312 Dysphagia, oropharyngeal phase: Secondary | ICD-10-CM | POA: Diagnosis not present

## 2020-11-21 ENCOUNTER — Ambulatory Visit (HOSPITAL_COMMUNITY)
Admission: RE | Admit: 2020-11-21 | Discharge: 2020-11-21 | Disposition: A | Discharge status: Home / Self Care | Payer: Medicare HMO | Source: Ambulatory Visit | Attending: Hematology | Admitting: Hematology

## 2020-11-21 ENCOUNTER — Inpatient Hospital Stay (HOSPITAL_COMMUNITY): Payer: Medicare HMO | Attending: Hematology

## 2020-11-21 ENCOUNTER — Other Ambulatory Visit: Payer: Self-pay

## 2020-11-21 DIAGNOSIS — Z8639 Personal history of other endocrine, nutritional and metabolic disease: Secondary | ICD-10-CM | POA: Insufficient documentation

## 2020-11-21 DIAGNOSIS — Z9981 Dependence on supplemental oxygen: Secondary | ICD-10-CM | POA: Insufficient documentation

## 2020-11-21 DIAGNOSIS — I2694 Multiple subsegmental pulmonary emboli without acute cor pulmonale: Secondary | ICD-10-CM | POA: Diagnosis not present

## 2020-11-21 DIAGNOSIS — I517 Cardiomegaly: Secondary | ICD-10-CM | POA: Diagnosis not present

## 2020-11-21 DIAGNOSIS — Z87891 Personal history of nicotine dependence: Secondary | ICD-10-CM | POA: Insufficient documentation

## 2020-11-21 DIAGNOSIS — Z9181 History of falling: Secondary | ICD-10-CM | POA: Insufficient documentation

## 2020-11-21 DIAGNOSIS — Z905 Acquired absence of kidney: Secondary | ICD-10-CM | POA: Insufficient documentation

## 2020-11-21 DIAGNOSIS — J9811 Atelectasis: Secondary | ICD-10-CM | POA: Diagnosis not present

## 2020-11-21 DIAGNOSIS — Z7901 Long term (current) use of anticoagulants: Secondary | ICD-10-CM | POA: Diagnosis not present

## 2020-11-21 DIAGNOSIS — R5383 Other fatigue: Secondary | ICD-10-CM | POA: Insufficient documentation

## 2020-11-21 DIAGNOSIS — R0602 Shortness of breath: Secondary | ICD-10-CM | POA: Insufficient documentation

## 2020-11-21 DIAGNOSIS — R911 Solitary pulmonary nodule: Secondary | ICD-10-CM | POA: Diagnosis not present

## 2020-11-21 DIAGNOSIS — I2699 Other pulmonary embolism without acute cor pulmonale: Secondary | ICD-10-CM | POA: Diagnosis not present

## 2020-11-21 DIAGNOSIS — Z85528 Personal history of other malignant neoplasm of kidney: Secondary | ICD-10-CM | POA: Diagnosis not present

## 2020-11-21 LAB — CBC WITH DIFFERENTIAL/PLATELET
Abs Immature Granulocytes: 0.04 10*3/uL (ref 0.00–0.07)
Basophils Absolute: 0.1 10*3/uL (ref 0.0–0.1)
Basophils Relative: 1 %
Eosinophils Absolute: 0.2 10*3/uL (ref 0.0–0.5)
Eosinophils Relative: 2 %
HCT: 31.1 % — ABNORMAL LOW (ref 39.0–52.0)
Hemoglobin: 8.5 g/dL — ABNORMAL LOW (ref 13.0–17.0)
Immature Granulocytes: 1 %
Lymphocytes Relative: 12 %
Lymphs Abs: 1 10*3/uL (ref 0.7–4.0)
MCH: 25.5 pg — ABNORMAL LOW (ref 26.0–34.0)
MCHC: 27.3 g/dL — ABNORMAL LOW (ref 30.0–36.0)
MCV: 93.4 fL (ref 80.0–100.0)
Monocytes Absolute: 0.4 10*3/uL (ref 0.1–1.0)
Monocytes Relative: 5 %
Neutro Abs: 6.6 10*3/uL (ref 1.7–7.7)
Neutrophils Relative %: 79 %
Platelets: 209 10*3/uL (ref 150–400)
RBC: 3.33 MIL/uL — ABNORMAL LOW (ref 4.22–5.81)
RDW: 18.9 % — ABNORMAL HIGH (ref 11.5–15.5)
WBC: 8.3 10*3/uL (ref 4.0–10.5)
nRBC: 0 % (ref 0.0–0.2)

## 2020-11-21 LAB — FOLATE: Folate: 34.7 ng/mL (ref >5.9)

## 2020-11-21 LAB — VITAMIN B12: Vitamin B-12: 278 pg/mL (ref 180–914)

## 2020-11-21 LAB — IRON AND TIBC
Iron: 25 ug/dL — ABNORMAL LOW (ref 45–182)
Saturation Ratios: 6 % — ABNORMAL LOW (ref 17.9–39.5)
TIBC: 423 ug/dL (ref 250–450)
UIBC: 398 ug/dL

## 2020-11-21 LAB — FERRITIN: Ferritin: 12 ng/mL — ABNORMAL LOW (ref 24–336)

## 2020-11-21 LAB — POCT I-STAT CREATININE: Creatinine, Ser: 0.9 mg/dL (ref 0.61–1.24)

## 2020-11-21 LAB — D-DIMER, QUANTITATIVE: D-Dimer, Quant: 0.34 ug/mL-FEU (ref 0.00–0.50)

## 2020-11-21 MED ORDER — IOHEXOL 350 MG/ML SOLN
25.0000 mL | Freq: Once | INTRAVENOUS | Status: AC | PRN
  Administered 2020-11-21: 25 mL via INTRAVENOUS

## 2020-11-22 ENCOUNTER — Other Ambulatory Visit: Payer: Self-pay | Admitting: *Deleted

## 2020-11-22 NOTE — Patient Outreach (Signed)
[facility]) Care Management  11/22/2020  Jerome Mays 21-Aug-1940 [id number]   Outgoing call placed to member's wife, state member has been doing well.  Was seen and evaluated by specialist for ALS at [facility]. They assessed need for PT and OT, ordered DME to help in the home.  Denies any urgent concerns, encouraged to contact this care manager with questions.  Agrees to follow up within the next month.   Goals Addressed             This Visit's Progress    [facility]) Learn More About My Health   On track    Timeframe:  Long-Range Goal Priority:  Medium Start Date:  5/19                            Expected End Date:  8/19             Barriers: Knowledge  Follow Up Date 6/17   - make a list of questions - ask questions - repeat what I heard to make sure I understand  -continue to monitor urine for hematuria and notify provider for change or worsening -continue to monitor for shortness of breath or hemoptysis    Why is this important?   The best way to learn about your health and care is by talking to the doctor and nurse.  They will answer your questions and give you information in the way that you like best.    Notes:   2/3 - Reviewed current symptoms, encouraged to contact PCP regarding GI concern  3/8 - Education regarding recovery from PE and kidney stones sent  4/18 - Confirmed no further issues with kidney stones, denies shortness of breath from PE, continues taking blood thinners for intervention  5/19 - Wife expressing concern regarding dental hygiene as member's ALS progresses.  Does not have any adaptive equipment, will send education with interventions on how to perform oral care for ALS patients  6/17 - Confirmed wife received education.  Wife also stated she received suggestions from provider at [facility] to use for better oral care.  A motorized wheelchair as well as hospital bed has been ordered.      [facility]) Make and Keep All Appointments   On  track    Timeframe:  Short-Term Goal Priority:  High Start Date:  5/19         Expected End Date:  6/19            Follow Up Date 6/17  Barriers: Knowledge    - ask family or friend for a ride - call to cancel if needed - keep a calendar with appointment dates  -request assistance from family for transportation to out of town appointments -schedule post hospital follow up with PCP -attend scheduled post hospital appointment with urologist on 06/17/2020 at 2:45   Why is this important?   Part of staying healthy is seeing the doctor for follow-up care.  If you forget your appointments, there are some things you can do to stay on track.    Notes:   2/3 - Reviewed upcoming appointments, encouraged to keep and attend  3/8 - Discussed upcoming appointments - today for radiology, pulmonology 3/15 and urology 3/18  4/18 - All follow up appointments complete, next ones scheduled for June (PCP, [place], Hematology, and Urology)  5/19 - upcoming appointments at [facility] on 6/14, with PCP on 6/10, and hematology on 6/23  6/17 - Confirmed he attended appointments at [facility] and PCP, hematology appointment remains next week         Valente David, RN, MSN [place] [facility] [phone]

## 2020-11-27 ENCOUNTER — Other Ambulatory Visit: Payer: Self-pay | Admitting: Pulmonary Disease

## 2020-11-28 ENCOUNTER — Ambulatory Visit (HOSPITAL_COMMUNITY): Payer: Medicare HMO | Admitting: Hematology

## 2020-11-30 DIAGNOSIS — J9601 Acute respiratory failure with hypoxia: Secondary | ICD-10-CM | POA: Diagnosis not present

## 2020-12-03 DIAGNOSIS — G1221 Amyotrophic lateral sclerosis: Secondary | ICD-10-CM | POA: Diagnosis not present

## 2020-12-04 ENCOUNTER — Ambulatory Visit (HOSPITAL_COMMUNITY): Payer: Medicare HMO | Admitting: Hematology and Oncology

## 2020-12-04 DIAGNOSIS — M25511 Pain in right shoulder: Secondary | ICD-10-CM | POA: Diagnosis not present

## 2020-12-05 ENCOUNTER — Ambulatory Visit (HOSPITAL_COMMUNITY): Payer: Medicare HMO | Admitting: Hematology and Oncology

## 2020-12-05 DIAGNOSIS — K219 Gastro-esophageal reflux disease without esophagitis: Secondary | ICD-10-CM | POA: Diagnosis not present

## 2020-12-05 DIAGNOSIS — N183 Chronic kidney disease, stage 3 unspecified: Secondary | ICD-10-CM | POA: Diagnosis not present

## 2020-12-05 DIAGNOSIS — M1990 Unspecified osteoarthritis, unspecified site: Secondary | ICD-10-CM | POA: Diagnosis not present

## 2020-12-06 ENCOUNTER — Inpatient Hospital Stay (HOSPITAL_COMMUNITY): Payer: Medicare HMO | Attending: Hematology and Oncology | Admitting: Hematology and Oncology

## 2020-12-06 ENCOUNTER — Encounter (HOSPITAL_COMMUNITY): Payer: Self-pay | Admitting: Hematology and Oncology

## 2020-12-06 ENCOUNTER — Other Ambulatory Visit: Payer: Self-pay

## 2020-12-06 DIAGNOSIS — I2694 Multiple subsegmental pulmonary emboli without acute cor pulmonale: Secondary | ICD-10-CM | POA: Diagnosis not present

## 2020-12-06 DIAGNOSIS — Z9981 Dependence on supplemental oxygen: Secondary | ICD-10-CM | POA: Insufficient documentation

## 2020-12-06 DIAGNOSIS — Z905 Acquired absence of kidney: Secondary | ICD-10-CM | POA: Diagnosis not present

## 2020-12-06 DIAGNOSIS — Z87891 Personal history of nicotine dependence: Secondary | ICD-10-CM | POA: Insufficient documentation

## 2020-12-06 NOTE — Progress Notes (Signed)
Jerome Mays, Jerome Mays [id number]   CLINIC:  Medical Oncology/Hematology  PCP:  [name], MD [address] / [place] [address]  [phone]  REASON FOR VISIT:  Follow-up for right subsegmental PE  PRIOR THERAPY: None  CURRENT THERAPY: Eliquis 5 mg BID  INTERVAL HISTORY:   Jerome Mays, a 80 y.o. male, returns for routine follow-up for his right subsegmental PE.  He is currently on anticoagulation with Eliquis 5 mg twice a day.  He is here today with his wife.  Since last visit, he mentions that his weakness has gotten worse in his upper extremities as well as his breathing has gotten worse.  He is on oxygen 2 L 24 x 7.  He also had a fall recently, did not have any major injury.  He has been compliant with anticoagulation.   REVIEW OF SYSTEMS:  Review of Systems  Constitutional:  Positive for fatigue (No energy). Negative for appetite change.  HENT:   Negative for nosebleeds.   Respiratory:  Positive for shortness of breath.   Gastrointestinal:  Negative for blood in stool.  Genitourinary:  Negative for hematuria and nocturia.   All other systems reviewed and are negative.  PAST MEDICAL/SURGICAL HISTORY:  Past Medical History:  Diagnosis Date   ALS (amyotrophic lateral sclerosis) ([place]) 2021   Anemia    low iron   Arthritis    Atypical chest pain 01/04/2020   Colonic polyp    Constipation    COPD (chronic obstructive pulmonary disease) (HCC)    Coronary artery calcification seen on CAT scan 01/04/2020   GERD (gastroesophageal reflux disease)    Gout    History of kidney stones    Kidney failure    Malignant tumor of kidney ([place])    Muscle atrophy    Pain    LOWER BACK AND LEFT HIP - HX OF PREVIOUS LUMBAR AND CERVICAL SURGERY--PT STATES HE HAS HAD NUMBNESS IN BOTH FEET SINCE HIS BACK SURGERY   Pure hypercholesterolemia 01/04/2020   Renal calculus    Retinal detachment    Shortness of breath 01/04/2020   Past  Surgical History:  Procedure Laterality Date   BACK SURGERY     LUMBAR SURGERY   CATARACT EXTRACTION Bilateral 2003   Surgeon unknown   CYSTOSCOPY W/ URETERAL STENT PLACEMENT Left 05/30/2020   Procedure: CYSTOSCOPY URETERAL STENT PLACEMENT;  Surgeon: Remi Haggard, MD;  Location: [facility];  Service: Urology;  Laterality: Left;   CYSTOSCOPY/URETEROSCOPY/HOLMIUM LASER/STENT PLACEMENT Left 06/18/2020   Procedure: CYSTOSCOPY/URETEROSCOPY/HOLMIUM LASER/STENT EXCHANGE;  Surgeon: Remi Haggard, MD;  Location: [facility];  Service: Urology;  Laterality: Left;  1 HR   EYE SURGERY     BILATERAL CATARACT EXTRACTIONS   GIVENS CAPSULE STUDY N/A 10/07/2015   Procedure: GIVENS CAPSULE STUDY;  Surgeon: Carol Ada, MD;  [facility];  Service: Endoscopy;  Laterality: N/A;   HAND SURGERY     left   KNEE SURGERY     right   LAMINECTOMY     cervical   LASER PHOTO ABLATION Left 08/19/2017   Procedure: LASER PHOTO ABLATION;  Surgeon: [name], MD;  Location: [place];  Service: Ophthalmology;  Laterality: Left;   MEMBRANE PEEL Left 03/22/2017   Procedure: POSSIBLE MEMBRANE PEEL WITH SILICONE OIL AND PERFLURON;  Surgeon: [name], MD;  Location: [place];  Service: Ophthalmology;  Laterality: Left;   NEPHRECTOMY     PT STATES RIGHT KIDNEY WAS REMOVED  PAROTIDECTOMY Right 06/13/2018   Procedure: RIGHT TOTAL PAROTIDECTOMY;  Surgeon: Leta Baptist, MD;  Location: [place];  Service: ENT;  Laterality: Right;   PARS PLANA VITRECTOMY Left 03/22/2017   Procedure: PARS PLANA VITRECTOMY WITH 25 GAUGE;  Surgeon: [name], MD;  Location: [place];  Service: Ophthalmology;  Laterality: Left;   PARS PLANA VITRECTOMY W/ SCLERAL BUCKLE Left 08/19/2017   Procedure: 49 GAUGE PARS PLANA VITRECTOMY WITH SILCONE OIL REMOVAL;  Surgeon: [name], MD;  Location: [place];  Service: Ophthalmology;  Laterality: Left;   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   SALIVARY STONE REMOVAL Right 02/21/2019    Procedure: RIGHT PAROTID FISTULA REPAIR;  Surgeon: Leta Baptist, MD;  Location: [place];  Service: ENT;  Laterality: Right;   SHOULDER SURGERY  2020   SCA   TOTAL KNEE ARTHROPLASTY Left 10/19/2012   Procedure: LEFT TOTAL KNEE ARTHROPLASTY;  Surgeon: Magnus Sinning, MD;  Location: [facility];  Service: Orthopedics;  Laterality: Left;   VITRECTOMY 25 GAUGE WITH SCLERAL BUCKLE Left 02/17/2017   Procedure: VITRECTOMY 25 GAUGE WITH SCLERAL BUCKLE membrane peel, injection of silicone oil, and endolaser photocoagulation.;  Surgeon: [name], MD;  Location: [place];  Service: Ophthalmology;  Laterality: Left;    SOCIAL HISTORY:  Social History   Socioeconomic History   Marital status: Married    Spouse name: Not on file   Number of children: 3   Years of education: 8th grade   Highest education level: Not on file  Occupational History   Occupation: Retired  Tobacco Use   Smoking status: Former    Packs/day: 2.00    Years: 40.00    Pack years: 80.00    Types: Cigarettes, Pipe    Quit date: 06/09/1995    Years since quitting: 25.5   Smokeless tobacco: Never  Vaping Use   Vaping Use: Never used  Substance and Sexual Activity   Alcohol use: No   Drug use: No   Sexual activity: Not on file  Other Topics Concern   Not on file  Social History Narrative   Lives with wife, Jerome Mays.   Right-handed.   No daily caffeine use.      Social Determinants of Health   Financial Resource Strain: Low Risk    Difficulty of Paying Living Expenses: Not hard at all  Food Insecurity: No Food Insecurity   Worried About Charity fundraiser in the Last Year: Never true   [place] in the Last Year: Never true  Transportation Needs: No Transportation Needs   Lack of Transportation (Medical): No   Lack of Transportation (Non-Medical): No  Physical Activity: Not on file  Stress: Not on file  Social Connections: Not on file  Intimate Partner Violence: Not on file    FAMILY HISTORY:   Family History  Problem Relation Age of Onset   Diabetes Mother    COPD Brother    COPD Sister    Heart attack Father    Amblyopia Neg Hx    Blindness Neg Hx    Cataracts Neg Hx    Glaucoma Neg Hx    Macular degeneration Neg Hx    Retinal detachment Neg Hx    Retinitis pigmentosa Neg Hx     CURRENT MEDICATIONS:  Current Outpatient Medications  Medication Sig Dispense Refill   clobetasol (TEMOVATE) 0.05 % external solution Apply 1 application topically daily as needed.     ELIQUIS 5 MG TABS tablet Take 1  tablet by mouth twice daily 60 tablet 0   Ferric Maltol (ACCRUFER) 30 MG CAPS      gabapentin (NEURONTIN) 300 MG capsule Take 300 mg by mouth 2 (two) times daily.     guaiFENesin (MUCINEX) 600 MG 12 hr tablet Take by mouth.     hydrOXYzine (ATARAX/VISTARIL) 10 MG tablet See admin instructions.     ketoconazole (NIZORAL) 2 % shampoo Apply 1 application topically every other day.     ketorolac (ACULAR) 0.5 % ophthalmic solution Place 1 drop into the left eye 4 (four) times daily. 10 mL 4   mirtazapine (REMERON) 7.5 MG tablet Take 7.5 mg by mouth at bedtime.     NON FORMULARY      nystatin-triamcinolone (MYCOLOG II) cream SMARTSIG:Sparingly Topical 2-3 Times Daily PRN     pantoprazole (PROTONIX) 40 MG tablet 1 tablet     polyethylene glycol (MIRALAX) 17 g packet Take 17 g by mouth daily.     pramoxine (CERAVE ITCH RELIEF) 1 % LOTN Apply 1 application topically 2 (two) times daily as needed (itching/dry skin.).     prednisoLONE acetate (PRED FORTE) 1 % ophthalmic suspension Place 1 drop into the left eye 4 (four) times daily. 10 mL 4   riluzole (RILUTEK) 50 MG tablet Take 1 tablet (50 mg total) by mouth every 12 (twelve) hours. 60 tablet 11   sodium chloride (MURO 128) 5 % ophthalmic solution Place 1 drop into the left eye in the morning and at bedtime.     TRELEGY ELLIPTA 200-62.5-25 MCG/INH AEPB Inhale 1 puff by mouth once daily 60 each 5   triamcinolone (KENALOG) 0.1 % Apply 1  application topically 2 (two) times daily as needed (skin irritation.).     allopurinol (ZYLOPRIM) 300 MG tablet Take 300 mg by mouth daily.     b complex vitamins tablet Take 1 tablet by mouth daily. (Patient not taking: Reported on 12/06/2020)     Cholecalciferol (VITAMIN D3) 50 MCG (2000 UT) TABS Take by mouth. (Patient not taking: Reported on 12/06/2020)     Multiple Vitamin (MULTIVITAMIN WITH MINERALS) TABS tablet Take 1 tablet by mouth daily. (Patient not taking: Reported on 12/06/2020)     No current facility-administered medications for this visit.    ALLERGIES:  Allergies  Allergen Reactions   Lipitor [Atorvastatin] Rash and Other (See Comments)    Passed out    PHYSICAL EXAM:  Performance status (ECOG): 2 - Symptomatic, <50% confined to bed  There were no vitals filed for this visit.  Wt Readings from Last 3 Encounters:  09/06/20 164 lb (74.4 kg)  08/20/20 164 lb 3.2 oz (74.5 kg)  07/31/20 163 lb 3.2 oz (74 kg)   PE deferred in lieu of counseling.  LABORATORY DATA:  I have reviewed the labs as listed.  CBC Latest Ref Rng & Units 11/21/2020 06/26/2020 06/25/2020  WBC 4.0 - 10.5 K/uL 8.3 6.5 7.0  Hemoglobin 13.0 - 17.0 g/dL 8.5(L) 8.3(L) 8.4(L)  Hematocrit 39.0 - 52.0 % 31.1(L) 28.3(L) 28.6(L)  Platelets 150 - 400 K/uL 209 218 229   CMP Latest Ref Rng & Units 11/21/2020 06/26/2020 06/25/2020  Glucose 70 - 99 mg/dL - 96 105(H)  BUN 8 - 23 mg/dL - 19 17  Creatinine 0.61 - 1.24 mg/dL 0.90 0.80 0.80  Sodium 135 - 145 mmol/L - 139 138  Potassium 3.5 - 5.1 mmol/L - 3.8 3.9  Chloride 98 - 111 mmol/L - 106 105  CO2 22 - 32 mmol/L - 26  25  Calcium 8.9 - 10.3 mg/dL - 9.0 9.1  Total Protein 6.5 - 8.1 g/dL - - -  Total Bilirubin 0.3 - 1.2 mg/dL - - -  Alkaline Phos 38 - 126 U/L - - -  AST 15 - 41 U/L - - -  ALT 0 - 44 U/L - - -      Component Value Date/Time   RBC 3.33 (L) 11/21/2020 1236   MCV 93.4 11/21/2020 1236   MCV 85 10/09/2019 0859   MCH 25.5 (L) 11/21/2020 1236    MCHC 27.3 (L) 11/21/2020 1236   RDW 18.9 (H) 11/21/2020 1236   RDW 20.2 (H) 10/09/2019 0859   LYMPHSABS 1.0 11/21/2020 1236   LYMPHSABS 1.7 10/09/2019 0859   MONOABS 0.4 11/21/2020 1236   EOSABS 0.2 11/21/2020 1236   EOSABS 0.3 10/09/2019 0859   BASOSABS 0.1 11/21/2020 1236   BASOSABS 0.1 10/09/2019 0859    DIAGNOSTIC IMAGING:  I have independently reviewed the scans and discussed with the patient. CT ANGIO CHEST PE W OR WO CONTRAST  Result Date: 11/21/2020 CLINICAL DATA:  Follow-up previous pulmonary emboli EXAM: CT ANGIOGRAPHY CHEST WITH CONTRAST TECHNIQUE: Multidetector CT imaging of the chest was performed using the standard protocol during bolus administration of intravenous contrast. Multiplanar CT image reconstructions and MIPs were obtained to evaluate the vascular anatomy. CONTRAST:  75 cc Omnipaque 350 COMPARISON:  05/29/2020.  02/16/2020.  Chest CT 08/13/2020 FINDINGS: Cardiovascular: The heart is enlarged. No pericardial fluid. Coronary artery calcification and aortic atherosclerotic calcification are noted. Maximal diameter of the ascending aorta measured at 3.8 cm today. Pulmonary arterial opacification is good. There are no new or residual pulmonary emboli. Mediastinum/Nodes: No mass or adenopathy. Lungs/Pleura: Background emphysema and extensive pulmonary scarring. Atelectasis at both lung bases left more than right. 5 mm pulmonary nodule in the lateral right lower lobe image 94 is unchanged since 3 months ago. Few other small scattered nodules are also stable. No new or enlarging nodule. Upper Abdomen: Negative Musculoskeletal: Chronic thoracic degenerative changes. Review of the MIP images confirms the above findings. IMPRESSION: No new or residual pulmonary emboli. Cardiomegaly. Coronary artery calcification. Aortic atherosclerotic calcification. Emphysema and pulmonary scarring. Atelectasis at the lung bases, left more than right. 5 mm pulmonary nodule at the lateral right lower  lobe axial image 94 seen previously is unchanged since 3 months ago. Previous recommendation stands, with CT follow-up at 6-12 months. Other small nodules previously seen are stable. Aortic Atherosclerosis (ICD10-I70.0) and Emphysema (ICD10-J43.9). Electronically Signed   By: Nelson Chimes M.D.   On: 11/21/2020 15:03      ASSESSMENT:   1.  Subsegmental pulmonary emboli in right middle and lower lobes without acute cor pulmonale:  -Developed ALS with weakness in the upper extremities and decreased mobility since the last 1 year. -CT angio PE protocol on 05/29/2020 showed 2 small filling defects within the subsegmental pulmonary artery branches in the anterior right middle lobe and in the posterior right lower lobe.  These are new since previous study on 02/25/2020.  Stable mild mediastinal and hilar lymphadenopathy with central calcifications consistent with old granulomatous disease. -Has been tolerating Eliquis 5 mg twice daily without any major problems.   2.  Social/family history: -Quit smoking 30 years ago.  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 -No family history of DVT/PE. -Brother had lung cancer and was a smoker.   3.  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   PLAN:  1.    Unprovoked to weakly provoked pulmonary embolism:  -He is currently on Eliquis for anticoagulation.  He is tolerating it well.  He had a fall since last visit, did not result in any major bleeding.  He had repeat CT imaging which did not show any evidence of residual PE.  D-dimer normal.  At this time he does certainly have risks for recurrent PE given his sedentary health status.  At the same time given his falls, he is also at high risk of bleeding.  I have discussed both benefits and risks of blood thinners once again and he would rather feel comfortable continuing the blood thinners.  He understands that it can be fatal bleeding with falls while on blood thinners. I have  also offered him a lower dose of Eliquis and he would like to continue at the current dose for now.  He will return to clinic in 1 year.  Benay Pike MD

## 2020-12-10 DIAGNOSIS — G1221 Amyotrophic lateral sclerosis: Secondary | ICD-10-CM | POA: Diagnosis not present

## 2020-12-11 ENCOUNTER — Other Ambulatory Visit (HOSPITAL_COMMUNITY): Payer: Self-pay

## 2020-12-11 DIAGNOSIS — I2694 Multiple subsegmental pulmonary emboli without acute cor pulmonale: Secondary | ICD-10-CM

## 2020-12-19 ENCOUNTER — Ambulatory Visit: Payer: Medicare HMO | Admitting: *Deleted

## 2020-12-19 ENCOUNTER — Other Ambulatory Visit: Payer: Self-pay | Admitting: *Deleted

## 2020-12-19 DIAGNOSIS — C649 Malignant neoplasm of unspecified kidney, except renal pelvis: Secondary | ICD-10-CM | POA: Diagnosis not present

## 2020-12-19 DIAGNOSIS — N401 Enlarged prostate with lower urinary tract symptoms: Secondary | ICD-10-CM | POA: Diagnosis not present

## 2020-12-19 DIAGNOSIS — N200 Calculus of kidney: Secondary | ICD-10-CM | POA: Diagnosis not present

## 2020-12-19 DIAGNOSIS — N529 Male erectile dysfunction, unspecified: Secondary | ICD-10-CM | POA: Diagnosis not present

## 2020-12-19 DIAGNOSIS — N138 Other obstructive and reflux uropathy: Secondary | ICD-10-CM | POA: Diagnosis not present

## 2020-12-19 NOTE — Patient Outreach (Signed)
[facility]) Care Management  [place]  12/19/2020   Jerome Mays 1940/07/15 [id number]   Outgoing call placed to member/wife.  Wife wasn't available, was able to speak with daughter, Jerome Mays.  She report she visits member every Thursday to provide wife with respite care.  She state member is doing well today.  Denies any urgent concerns, encouraged to contact this care manager with questions.    Encounter Medications:  Outpatient Encounter Medications as of 12/19/2020  Medication Sig Note   allopurinol (ZYLOPRIM) 300 MG tablet Take 300 mg by mouth daily.    b complex vitamins tablet Take 1 tablet by mouth daily. (Patient not taking: Reported on 12/06/2020) 06/17/2020: On hold due to upcoming procedure.   Cholecalciferol (VITAMIN D3) 50 MCG (2000 UT) TABS Take by mouth. (Patient not taking: Reported on 12/06/2020) 06/17/2020: On hold due to upcoming procedure.    clobetasol (TEMOVATE) 0.05 % external solution Apply 1 application topically daily as needed.    ELIQUIS 5 MG TABS tablet Take 1 tablet by mouth twice daily    Ferric Maltol (ACCRUFER) 30 MG CAPS     gabapentin (NEURONTIN) 300 MG capsule Take 300 mg by mouth 2 (two) times daily.    guaiFENesin (MUCINEX) 600 MG 12 hr tablet Take by mouth.    hydrOXYzine (ATARAX/VISTARIL) 10 MG tablet See admin instructions.    ketoconazole (NIZORAL) 2 % shampoo Apply 1 application topically every other day.    ketorolac (ACULAR) 0.5 % ophthalmic solution Place 1 drop into the left eye 4 (four) times daily.    mirtazapine (REMERON) 7.5 MG tablet Take 7.5 mg by mouth at bedtime.    Multiple Vitamin (MULTIVITAMIN WITH MINERALS) TABS tablet Take 1 tablet by mouth daily. (Patient not taking: Reported on 12/06/2020) 06/17/2020: On hold due to upcoming procedure.    NON FORMULARY     nystatin-triamcinolone (MYCOLOG II) cream SMARTSIG:Sparingly Topical 2-3 Times Daily PRN    pantoprazole (PROTONIX) 40 MG tablet 1 tablet     polyethylene glycol (MIRALAX) 17 g packet Take 17 g by mouth daily.    pramoxine (CERAVE ITCH RELIEF) 1 % LOTN Apply 1 application topically 2 (two) times daily as needed (itching/dry skin.).    prednisoLONE acetate (PRED FORTE) 1 % ophthalmic suspension Place 1 drop into the left eye 4 (four) times daily.    riluzole (RILUTEK) 50 MG tablet Take 1 tablet (50 mg total) by mouth every 12 (twelve) hours.    sodium chloride (MURO 128) 5 % ophthalmic solution Place 1 drop into the left eye in the morning and at bedtime.    TRELEGY ELLIPTA 200-62.5-25 MCG/INH AEPB Inhale 1 puff by mouth once daily    triamcinolone (KENALOG) 0.1 % Apply 1 application topically 2 (two) times daily as needed (skin irritation.).    No facility-administered encounter medications on file as of 12/19/2020.    Functional Status:  In your present state of health, do you have any difficulty performing the following activities: 06/24/2020 06/18/2020  Hearing? [place]? 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 N N  Walking or climbing stairs? Y Y  Comment - -  Dressing or bathing? Y N  Comment - -  Doing errands, shopping? Y -  Some recent data might be hidden    Fall/Depression Screening: Fall Risk  06/10/2020  Falls in the past year? 1  Number  falls in past yr: 0  Injury with Fall? 0  Risk for fall due to : History of fall(s);Impaired vision;Medication side effect;Impaired mobility;Impaired balance/gait  Follow up Falls evaluation completed;Education provided;Falls prevention discussed   PHQ 2/9 Scores 06/10/2020  PHQ - 2 Score 0    Assessment:   Care Plan There are no care plans that you recently modified to display for this patient.    Goals Addressed             This Visit's Progress    [facility]) Learn More About My Health   On track    Timeframe:  Long-Range Goal Priority:  Medium Start Date:  5/19                             Expected End Date:  8/19             Barriers: Knowledge  Follow Up Date 6/17   - make a list of questions - ask questions - repeat what I heard to make sure I understand  -continue to monitor urine for hematuria and notify provider for change or worsening -continue to monitor for shortness of breath or hemoptysis    Why is this important?   The best way to learn about your health and care is by talking to the doctor and nurse.  They will answer your questions and give you information in the way that you like best.    Notes:   2/3 - Reviewed current symptoms, encouraged to contact PCP regarding GI concern  3/8 - Education regarding recovery from PE and kidney stones sent  4/18 - Confirmed no further issues with kidney stones, denies shortness of breath from PE, continues taking blood thinners for intervention  5/19 - Wife expressing concern regarding dental hygiene as member's ALS progresses.  Does not have any adaptive equipment, will send education with interventions on how to perform oral care for ALS patients  6/17 - Confirmed wife received education.  Wife also stated she received suggestions from provider at [facility] to use for better oral care.  A motorized wheelchair as well as hospital bed has been ordered.  7/14 - Per daughter, member has hospital bed, still waiting on wheelchair.  They are having a ramp installed at the home as well.     [facility]) Make and Keep All Appointments   On track    Timeframe:  Short-Term Goal Priority:  High Start Date:  5/19         Expected End Date:  8/19        Barriers: Knowledge    - ask family or friend for a ride - call to cancel if needed - keep a calendar with appointment dates  -request assistance from family for transportation to out of town appointments -schedule post hospital follow up with PCP -attend scheduled post hospital appointment with urologist on 06/17/2020 at 2:45   Why is this important?   Part of staying  healthy is seeing the doctor for follow-up care.  If you forget your appointments, there are some things you can do to stay on track.    Notes:   2/3 - Reviewed upcoming appointments, encouraged to keep and attend  3/8 - Discussed upcoming appointments - today for radiology, pulmonology 3/15 and urology 3/18  4/18 - All follow up appointments complete, next ones scheduled for June (PCP, [facility], Hematology, and Urology)  5/19 - upcoming appointments at [facility] on  6/14, with PCP on 6/10, and hematology on 6/23  6/17 - Confirmed he attended appointments at [facility] and PCP, hematology appointment remains next week  7/14 - Wife not available to discuss MD visits, will discuss during next call        Plan:  Follow-up: Patient agrees to Care Plan and Follow-up. Follow-up in 1 month(s).  Valente David, [place], MSN [place] [phone]

## 2020-12-30 DIAGNOSIS — J9601 Acute respiratory failure with hypoxia: Secondary | ICD-10-CM | POA: Diagnosis not present

## 2020-12-31 ENCOUNTER — Ambulatory Visit (INDEPENDENT_AMBULATORY_CARE_PROVIDER_SITE_OTHER): Payer: Medicare HMO | Admitting: Ophthalmology

## 2020-12-31 ENCOUNTER — Encounter (INDEPENDENT_AMBULATORY_CARE_PROVIDER_SITE_OTHER): Payer: Self-pay | Admitting: Ophthalmology

## 2020-12-31 ENCOUNTER — Other Ambulatory Visit: Payer: Self-pay

## 2020-12-31 DIAGNOSIS — H1820 Unspecified corneal edema: Secondary | ICD-10-CM | POA: Diagnosis not present

## 2020-12-31 DIAGNOSIS — H3322 Serous retinal detachment, left eye: Secondary | ICD-10-CM

## 2020-12-31 DIAGNOSIS — Z961 Presence of intraocular lens: Secondary | ICD-10-CM

## 2020-12-31 DIAGNOSIS — H3581 Retinal edema: Secondary | ICD-10-CM | POA: Diagnosis not present

## 2020-12-31 DIAGNOSIS — H35352 Cystoid macular degeneration, left eye: Secondary | ICD-10-CM | POA: Diagnosis not present

## 2020-12-31 DIAGNOSIS — H3522 Other non-diabetic proliferative retinopathy, left eye: Secondary | ICD-10-CM

## 2020-12-31 NOTE — Progress Notes (Signed)
. [facility] Note  12/31/2020     CHIEF COMPLAINT Patient presents for Retina Follow Up   HISTORY OF PRESENT ILLNESS: Jerome Mays is a 80 y.o. male who presents to the clinic today for:  No change in [place] noticed. HPI     Retina Follow Up   Patient presents with  Retinal Break/Detachment.  In left eye.  Severity is moderate.  Duration of 4 months.  Since onset it is stable.  I, the attending physician,  performed the HPI with the patient and updated documentation appropriately.        Comments   Patient states vision the same OU. No new floaters or flashes. Using Pred Forte and ketorolac qid OS. Using another drop-can't remember name--? Muro 128 bid OD. On ASA 81 mg. Was on eliquis 5 mg bid for history of blood clots in lungs, but doctor told patient he could discontinue eliquis.       Last edited by [name], MD on 12/31/2020  3:56 PM.     Referring physician: Rutherford Guys, [place],  [place] [id number]  HISTORICAL INFORMATION:   Selected notes from the MEDICAL RECORD NUMBER Initial referral from Dr. [name] for RD OS   CURRENT MEDICATIONS: Current Outpatient Medications (Ophthalmic Drugs)  Medication Sig   ketorolac (ACULAR) 0.5 % ophthalmic solution Place 1 drop into the left eye 4 (four) times daily.   prednisoLONE acetate (PRED FORTE) 1 % ophthalmic suspension Place 1 drop into the left eye 4 (four) times daily.   sodium chloride (MURO 128) 5 % ophthalmic solution Place 1 drop into the left eye in the morning and at bedtime.   No current facility-administered medications for this visit. (Ophthalmic Drugs)   Current Outpatient Medications (Other)  Medication Sig   allopurinol (ZYLOPRIM) 300 MG tablet Take 300 mg by mouth daily.   clobetasol (TEMOVATE) 0.05 % external solution Apply 1 application topically daily as needed.   ELIQUIS 5 MG TABS tablet Take 1 tablet by mouth twice daily   Ferric Maltol  (ACCRUFER) 30 MG CAPS    gabapentin (NEURONTIN) 300 MG capsule Take 300 mg by mouth 2 (two) times daily.   guaiFENesin (MUCINEX) 600 MG 12 hr tablet Take by mouth.   hydrOXYzine (ATARAX/VISTARIL) 10 MG tablet See admin instructions.   ketoconazole (NIZORAL) 2 % shampoo Apply 1 application topically every other day.   mirtazapine (REMERON) 7.5 MG tablet Take 7.5 mg by mouth at bedtime.   NON FORMULARY    nystatin-triamcinolone (MYCOLOG II) cream SMARTSIG:Sparingly Topical 2-3 Times Daily PRN   pantoprazole (PROTONIX) 40 MG tablet 1 tablet   polyethylene glycol (MIRALAX / GLYCOLAX) 17 g packet Take 17 g by mouth daily.   pramoxine (CERAVE ITCH RELIEF) 1 % LOTN Apply 1 application topically 2 (two) times daily as needed (itching/dry skin.).   riluzole (RILUTEK) 50 MG tablet Take 1 tablet (50 mg total) by mouth every 12 (twelve) hours.   TRELEGY ELLIPTA 200-62.5-25 MCG/INH AEPB Inhale 1 puff by mouth once daily   triamcinolone (KENALOG) 0.1 % Apply 1 application topically 2 (two) times daily as needed (skin irritation.).   b complex vitamins tablet Take 1 tablet by mouth daily. (Patient not taking: No sig reported)   Cholecalciferol (VITAMIN D3) 50 MCG (2000 UT) TABS Take by mouth. (Patient not taking: No sig reported)   Multiple Vitamin (MULTIVITAMIN WITH MINERALS) TABS tablet Take 1 tablet by mouth daily. (Patient not  taking: No sig reported)   No current facility-administered medications for this visit. (Other)      REVIEW OF SYSTEMS: ROS   Positive for: Gastrointestinal, Musculoskeletal, Cardiovascular, Eyes, Respiratory Negative for: Constitutional, Neurological, Skin, Genitourinary, HENT, Endocrine, Psychiatric, Allergic/Imm, Heme/Lymph Last edited by Roselee Nova D, COT on 12/31/2020  1:03 PM.        ALLERGIES Allergies  Allergen Reactions   Lipitor [Atorvastatin] Rash and Other (See Comments)    Passed out    PAST MEDICAL HISTORY Past Medical History:  Diagnosis Date    ALS (amyotrophic lateral sclerosis) ([facility]) 2021   Anemia    low iron   Arthritis    Atypical chest pain 01/04/2020   Colonic polyp    Constipation    COPD (chronic obstructive pulmonary disease) (HCC)    Coronary artery calcification seen on CAT scan 01/04/2020   GERD (gastroesophageal reflux disease)    Gout    History of kidney stones    Kidney failure    Malignant tumor of kidney ([facility])    Muscle atrophy    Pain    LOWER BACK AND LEFT HIP - HX OF PREVIOUS LUMBAR AND CERVICAL SURGERY--PT STATES HE HAS HAD NUMBNESS IN BOTH FEET SINCE HIS BACK SURGERY   Pure hypercholesterolemia 01/04/2020   Renal calculus    Retinal detachment    Shortness of breath 01/04/2020   Past Surgical History:  Procedure Laterality Date   BACK SURGERY     LUMBAR SURGERY   CATARACT EXTRACTION Bilateral 2003   Surgeon unknown   CYSTOSCOPY W/ URETERAL STENT PLACEMENT Left 05/30/2020   Procedure: CYSTOSCOPY URETERAL STENT PLACEMENT;  Surgeon: Remi Haggard, MD;  Location: [facility];  Service: Urology;  Laterality: Left;   CYSTOSCOPY/URETEROSCOPY/HOLMIUM LASER/STENT PLACEMENT Left 06/18/2020   Procedure: CYSTOSCOPY/URETEROSCOPY/HOLMIUM LASER/STENT EXCHANGE;  Surgeon: Remi Haggard, MD;  Location: [facility];  Service: Urology;  Laterality: Left;  1 HR   EYE SURGERY     BILATERAL CATARACT EXTRACTIONS   GIVENS CAPSULE STUDY N/A 10/07/2015   Procedure: GIVENS CAPSULE STUDY;  Surgeon: Carol Ada, MD;  Location: [facility];  Service: Endoscopy;  Laterality: N/A;   HAND SURGERY     left   KNEE SURGERY     right   LAMINECTOMY     cervical   LASER PHOTO ABLATION Left 08/19/2017   Procedure: LASER PHOTO ABLATION;  Surgeon: [name], MD;  Location: [facility];  Service: Ophthalmology;  Laterality: Left;   MEMBRANE PEEL Left 03/22/2017   Procedure: POSSIBLE MEMBRANE PEEL WITH SILICONE OIL AND PERFLURON;  Surgeon: [name], MD;  Location: [facility];  Service: Ophthalmology;  Laterality: Left;   NEPHRECTOMY     PT  STATES RIGHT KIDNEY WAS REMOVED   PAROTIDECTOMY Right 06/13/2018   Procedure: RIGHT TOTAL PAROTIDECTOMY;  Surgeon: Leta Baptist, MD;  Location: [facility];  Service: ENT;  Laterality: Right;   PARS PLANA VITRECTOMY Left 03/22/2017   Procedure: PARS PLANA VITRECTOMY WITH 25 GAUGE;  Surgeon: [name], MD;  Location: [facility];  Service: Ophthalmology;  Laterality: Left;   PARS PLANA VITRECTOMY W/ SCLERAL BUCKLE Left 08/19/2017   Procedure: 75 GAUGE PARS PLANA VITRECTOMY WITH SILCONE OIL REMOVAL;  Surgeon: [name], MD;  Location: [facility];  Service: Ophthalmology;  Laterality: Left;   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   SALIVARY STONE REMOVAL Right 02/21/2019   Procedure: RIGHT PAROTID FISTULA REPAIR;  Surgeon: Leta Baptist, MD;  Location: [facility];  Service: ENT;  Laterality: Right;   SHOULDER SURGERY  2020   SCA   TOTAL KNEE ARTHROPLASTY Left 10/19/2012   Procedure: LEFT TOTAL KNEE ARTHROPLASTY;  Surgeon: Magnus Sinning, MD;  Location: [facility];  Service: Orthopedics;  Laterality: Left;   VITRECTOMY 25 GAUGE WITH SCLERAL BUCKLE Left 02/17/2017   Procedure: VITRECTOMY 25 GAUGE WITH SCLERAL BUCKLE membrane peel, injection of silicone oil, and endolaser photocoagulation.;  Surgeon: [name], MD;  Location: [facility];  Service: Ophthalmology;  Laterality: Left;    FAMILY HISTORY Family History  Problem Relation Age of Onset   Diabetes Mother    COPD Brother    COPD Sister    Heart attack Father    Amblyopia Neg Hx    Blindness Neg Hx    Cataracts Neg Hx    Glaucoma Neg Hx    Macular degeneration Neg Hx    Retinal detachment Neg Hx    Retinitis pigmentosa Neg Hx     SOCIAL HISTORY Social History   Tobacco Use   Smoking status: Former    Packs/day: 2.00    Years: 40.00    Pack years: 80.00    Types: Cigarettes, Pipe    Quit date: 06/09/1995    Years since quitting: 25.5   Smokeless tobacco: Never  Vaping Use   Vaping Use: Never used  Substance Use Topics    Alcohol use: No   Drug use: No         OPHTHALMIC EXAM:  Base Eye Exam     Visual Acuity (Snellen - Linear)       Right Left   Dist cc 20/25 -2 20/100 +2   Dist ph cc 20/20 -2 20/70 -2         Tonometry (Tonopen, 1:18 PM)       Right Left   Pressure 11 12         Pupils       Dark Light Shape React APD   Right 3 2 Round Brisk None   Left 5 5 Round Minimal None         Visual Fields (Counting fingers)       Left Right    Full Full         Extraocular Movement       Right Left    Full, Ortho Full, Ortho         Neuro/Psych     Oriented x3: Yes   Mood/Affect: Normal         Dilation     Both eyes: 1.0% Mydriacyl, 2.5% Phenylephrine @ 1:19 PM           Slit Lamp and Fundus Exam     External Exam       Right Left   External Normal Periorbital edema improving         Slit Lamp Exam       Right Left   Lids/Lashes Dermatochalasis - upper lid Dermatochalasis - upper lid   Conjunctiva/Sclera White and quiet White and quiet   Cornea Arcus, mild tear film debris, 1+ Punctate epithelial erosions arcus, tear film debris, trace PEE, mild haze   Anterior Chamber Deep and quiet Deep, quiet   Iris Round and dilated Round and moderately dilated   Lens Three piece Posterior chamber intraocular lens, trace PCO Three piece Posterior chamber intraocular lens in good position, micro condensations on posterior lens surface - stable   Vitreous Vitreous syneresis, PVD Post vitrectomy  Fundus Exam       Right Left   Disc Pink and Sharp, mild Peripapillary atrophy, Compact, mild PPP Sharp rim, mild pallor, temporal Peripapillary atrophy, +cupping   C/D Ratio 0.4 0.6   Macula Flat, good foveal reflex, Retinal pigment epithelial mottling - mild, No heme or edema  flat; Blunted foveal reflex, mild cystic changes temporal macula - slightly increased, no heme   Vessels Vascular attenuation, Tortuous Vascular attenuation, mild tortuosity    Periphery Attached, mild Reticular degeneration; no RT or RD Attached, moderate buckle height; temporal/inf retinectomy from 2-630 with peripheral edge attached with good laser at the edge; trace areas of fibrosis inferiorly and superior temporal -- surrounded by good laser           Refraction     Wearing Rx       Sphere Cylinder Axis Add   Right -0.75 +1.00 151 +2.50   Left -0.50 +1.00 020 +2.50    Type: PAL         Manifest Refraction       Sphere Cylinder Axis Dist VA   Right -0.25 +1.00 150 20/20-2   Left [place] +1.00 020 20/70-2            IMAGING AND PROCEDURES  Imaging and Procedures for 09/19/17  OCT, Retina - OU - Both Eyes       Right Eye Quality was good. Central Foveal Thickness: 252. Progression has been stable. Findings include normal foveal contour, no IRF, no SRF (Partial PVD).   Left Eye Quality was good. Central Foveal Thickness: 490. Progression has worsened. Findings include no SRF, epiretinal membrane, intraretinal fluid, abnormal foveal contour (Patchy ORA, interval increase in IRF/CME temporal macula).   Notes Images taken, stored on drive  Diagnosis / Impression:  OD: NFP, No IRF, No SRF, partial PVD OS: retina attached with oil out - Patchy ORA; interval increase in IRF/CME temporal macula  Clinical management:  See below  Abbreviations: NFP - Normal foveal profile. CME - cystoid macular edema. PED - pigment epithelial detachment. IRF - intraretinal fluid. SRF - subretinal fluid. EZ - ellipsoid zone. ERM - epiretinal membrane. ORA - outer retinal atrophy. ORT - outer retinal tubulation. SRHM - subretinal hyper-reflective material             ASSESSMENT/PLAN:    ICD-10-CM   1. 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 Cystoid macular edema of left eye  H35.352     4. Retinal edema  H35.81 OCT, Retina - OU - Both Eyes    5. Pseudophakia, both eyes  Z96.1     6.  Corneal edema of left eye  H18.20       1-4. 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23.20 to 03.23.21 due to new diagnosis of  motor neuron disease/ALS  - retina attached and in good position  - s/p STK OS #1 (07.14.20), #2 (3.23.21), #3 (06.21.21), #4 (10.7.21)  - currently on PF QID and Ketorolac QID OS for CME  - OCT today shows mild interval increase in focal IRF/CME temporal macula OS  - BCVA improved to 20/70 from 20/80 today             - recommend STK OS #5 today, 7.26.22             - Pt wishes to defer for now -- will recheck in 4 wks  - corneal edema slightly improved-- stay on muro 128 OS  - continue PF QID and ketorolac QID OS  - IOP 12 today -- good off Alphagan P  - f/u 4 wks for DFE, OCT/likely STK OS  5. Pseudophakia OU  - IOLs in good position OU  - monitor  6. Corneal edema OS -- improved  - persistent post op corneal edema OS as above  - cont Muro 128 BID as above  Ophthalmic Meds Ordered this visit:  No orders of the defined types were placed in this encounter.     Return in about 4 weeks (around 01/28/2021) for 4 wk f/u for DFE/OCT/likely STK OS.  There are no Patient Instructions on file for this visit.   Explained the diagnoses, plan, and follow up with the patient and they expressed understanding.  Patient expressed understanding of the importance of  proper follow up care.  This document serves as a record of services personally performed by Gardiner Sleeper, MD, PhD. It was created on their behalf by Estill Bakes, COT an ophthalmic technician. The creation of this record is the provider's dictation and/or activities during the visit.    Electronically signed by: Estill Bakes, COT 7.26.22 @ 3:56 PM   Gardiner Sleeper, M.D., Ph.D. Diseases & Surgery of the Retina and [place] 7.26.22  I have reviewed the above documentation for accuracy and completeness, and I agree with the above. Gardiner Sleeper, M.D., Ph.D. 12/31/20 4:01 PM   Abbreviations: M myopia (nearsighted); A astigmatism; H hyperopia (farsighted); P presbyopia; Mrx spectacle prescription;  CTL contact lenses; OD right eye; OS left eye; OU both eyes  XT exotropia; ET esotropia; PEK punctate epithelial keratitis; PEE punctate epithelial erosions; DES dry eye syndrome; MGD meibomian gland dysfunction; ATs artificial tears; PFAT's preservative free artificial tears; [place] nuclear sclerotic cataract; PSC posterior subcapsular cataract; ERM epi-retinal membrane; PVD posterior vitreous detachment; RD retinal detachment; DM diabetes mellitus; DR diabetic retinopathy; NPDR non-proliferative diabetic retinopathy; PDR proliferative diabetic retinopathy; CSME clinically significant macular edema; DME diabetic macular edema; dbh dot blot hemorrhages; CWS cotton wool spot; POAG primary open angle glaucoma; C/D cup-to-disc ratio; HVF humphrey visual field; GVF goldmann visual field; OCT optical coherence tomography; IOP intraocular pressure; BRVO Branch retinal vein occlusion; CRVO central retinal vein occlusion; CRAO central retinal artery occlusion; BRAO branch retinal artery occlusion; RT retinal tear; SB scleral buckle; PPV pars plana vitrectomy; VH Vitreous hemorrhage; PRP panretinal laser photocoagulation; IVK intravitreal kenalog; VMT vitreomacular traction; MH  Macular hole;  NVD neovascularization of the disc; NVE neovascularization elsewhere; AREDS age related eye disease study; ARMD age related macular degeneration; POAG primary open angle glaucoma; EBMD epithelial/anterior basement membrane dystrophy; ACIOL anterior chamber intraocular lens; IOL intraocular lens; PCIOL posterior chamber intraocular lens; Phaco/IOL phacoemulsification with intraocular lens placement; PRK photorefractive keratectomy; LASIK laser assisted in situ keratomileusis; HTN hypertension;  DM diabetes mellitus; COPD chronic obstructive pulmonary disease

## 2021-01-02 DIAGNOSIS — G1221 Amyotrophic lateral sclerosis: Secondary | ICD-10-CM | POA: Diagnosis not present

## 2021-01-03 DIAGNOSIS — G1221 Amyotrophic lateral sclerosis: Secondary | ICD-10-CM | POA: Diagnosis not present

## 2021-01-05 DIAGNOSIS — K219 Gastro-esophageal reflux disease without esophagitis: Secondary | ICD-10-CM | POA: Diagnosis not present

## 2021-01-05 DIAGNOSIS — M5416 Radiculopathy, lumbar region: Secondary | ICD-10-CM | POA: Diagnosis not present

## 2021-01-05 DIAGNOSIS — N183 Chronic kidney disease, stage 3 unspecified: Secondary | ICD-10-CM | POA: Diagnosis not present

## 2021-01-05 DIAGNOSIS — M1990 Unspecified osteoarthritis, unspecified site: Secondary | ICD-10-CM | POA: Diagnosis not present

## 2021-01-10 DIAGNOSIS — G1221 Amyotrophic lateral sclerosis: Secondary | ICD-10-CM | POA: Diagnosis not present

## 2021-01-13 ENCOUNTER — Other Ambulatory Visit: Payer: Self-pay | Admitting: *Deleted

## 2021-01-13 ENCOUNTER — Telehealth: Payer: Self-pay | Admitting: Pulmonary Disease

## 2021-01-13 MED ORDER — TRELEGY ELLIPTA 200-62.5-25 MCG/INH IN AEPB: 1.0000 | INHALATION_SPRAY | Freq: Every day | RESPIRATORY_TRACT | 5 refills | Status: DC

## 2021-01-13 NOTE — Telephone Encounter (Signed)
Fax received from [facility] requesting printed Trelegy prescription.  [facility] Pharmacist has completed Trelegy Patient assistance and needs signed Trelegy prescription to fax completed enrollment to [place].  Printed Trelegy prescription printed, stamped, and faxed to [place].  Nothing further at this time.

## 2021-01-14 ENCOUNTER — Telehealth: Payer: Self-pay | Admitting: Pulmonary Disease

## 2021-01-14 NOTE — Telephone Encounter (Signed)
PM are you ok with [place] sending the rx over to [facility] so they can send the rx in for patient assistance on the Trelegy.  Thanks

## 2021-01-15 ENCOUNTER — Other Ambulatory Visit: Payer: Self-pay | Admitting: *Deleted

## 2021-01-15 MED ORDER — TRELEGY ELLIPTA 200-62.5-25 MCG/INH IN AEPB: 1.0000 | INHALATION_SPRAY | Freq: Every day | RESPIRATORY_TRACT | 5 refills | Status: DC

## 2021-01-15 NOTE — Progress Notes (Addendum)
See 01/13/21 encounter. Trelegy prescription faxed to [place], [place] for Patient Assistance.

## 2021-01-16 ENCOUNTER — Other Ambulatory Visit: Payer: Self-pay | Admitting: *Deleted

## 2021-01-16 NOTE — Patient Outreach (Signed)
[facility]) Care Management  01/16/2021  Jerome Mays [birth year] [id number]   Outgoing call placed to wife, report member is "about the same."  Denies any urgent concerns, encouraged to contact this care manager with questions.  Agrees to follow up within the next month.   Goals Addressed             This Visit's Progress    [facility]) Learn More About My Health   On track    Timeframe:  Long-Range Goal Priority:  Medium Start Date:  5/19                            Expected End Date:  8/19             Barriers: Knowledge  - make a list of questions - ask questions - repeat what I heard to make sure I understand  -continue to monitor urine for hematuria and notify provider for change or worsening -continue to monitor for shortness of breath or hemoptysis    Why is this important?   The best way to learn about your health and care is by talking to the doctor and nurse.  They will answer your questions and give you information in the way that you like best.    Notes:   2/3 - Reviewed current symptoms, encouraged to contact PCP regarding GI concern  3/8 - Education regarding recovery from PE and kidney stones sent  4/18 - Confirmed no further issues with kidney stones, denies shortness of breath from PE, continues taking blood thinners for intervention  5/19 - Wife expressing concern regarding dental hygiene as member's ALS progresses.  Does not have any adaptive equipment, will send education with interventions on how to perform oral care for ALS patients  6/17 - Confirmed wife received education.  Wife also stated she received suggestions from provider at [facility] to use for better oral care.  A motorized wheelchair as well as hospital bed has been ordered.  7/14 - Per daughter, member has hospital bed, still waiting on wheelchair.  They are having a ramp installed at the home as well.  8/11 - Wife report wheelchair received, no ramp as of yet.  Waiting on  call to complete.  Advised if unable to get completed, contact this care manager and referral for care guide will be placed.  Also report member having some drainage from nose during meals, concerned it may be from allergies.  Advised to try nasal spray or discuss allergy medication with PCP.     [facility]) Make and Keep All Appointments   On track    Timeframe:  Short-Term Goal Priority:  High Start Date:  5/19         Expected End Date:  8/19        Barriers: Knowledge    - ask family or friend for a ride - call to cancel if needed - keep a calendar with appointment dates  -request assistance from family for transportation to out of town appointments -schedule post hospital follow up with PCP -attend scheduled post hospital appointment with urologist on 06/17/2020 at 2:45   Why is this important?   Part of staying healthy is seeing the doctor for follow-up care.  If you forget your appointments, there are some things you can do to stay on track.    Notes:   2/3 - Reviewed upcoming appointments, encouraged to keep and attend  3/8 -  Discussed upcoming appointments - today for radiology, pulmonology 3/15 and urology 3/18  4/18 - All follow up appointments complete, next ones scheduled for June (PCP, [place], Hematology, and Urology)  5/19 - upcoming appointments at [facility] on 6/14, with PCP on 6/10, and hematology on 6/23  6/17 - Confirmed he attended appointments at [facility] and PCP, hematology appointment remains next week  7/14 - Wife not available to discuss MD visits, will discuss during next call  8/11 - Follow up with urology complete, no recurrent bleeding or concerns.  Will call to schedule follow up with PCP       Valente David, RN, MSN [place] Manager [phone]

## 2021-01-20 NOTE — Telephone Encounter (Signed)
Ok to send Rx to [facility]. Thanks

## 2021-01-20 NOTE — Telephone Encounter (Signed)
Called and spoke with Jerome Mays. She stated that she received the Trelegy RX last week.   Nothing further needed at time of call.

## 2021-01-27 DIAGNOSIS — M5416 Radiculopathy, lumbar region: Secondary | ICD-10-CM | POA: Diagnosis not present

## 2021-01-27 DIAGNOSIS — N183 Chronic kidney disease, stage 3 unspecified: Secondary | ICD-10-CM | POA: Diagnosis not present

## 2021-01-27 DIAGNOSIS — K219 Gastro-esophageal reflux disease without esophagitis: Secondary | ICD-10-CM | POA: Diagnosis not present

## 2021-01-27 DIAGNOSIS — M1990 Unspecified osteoarthritis, unspecified site: Secondary | ICD-10-CM | POA: Diagnosis not present

## 2021-01-27 NOTE — Progress Notes (Signed)
. [facility] Note  01/28/2021     CHIEF COMPLAINT Patient presents for Retina Follow Up   HISTORY OF PRESENT ILLNESS: Jerome Mays is a 80 y.o. male who presents to the clinic today for:  No change in [place] noticed. HPI     Retina Follow Up   Patient presents with  Retinal Break/Detachment.  In left eye.  Duration of 4 weeks.  Since onset it is stable.  I, the attending physician,  performed the HPI with the patient and updated documentation appropriately.        Comments   Pt here for 4 wk ret f/u RD OS. Possible STK OS. Pt states vision is the same, no changes noted.       Last edited by [name], MD on 01/28/2021 11:30 PM.      Referring physician: [name], MD [place],  [place] [address]  HISTORICAL INFORMATION:   Selected notes from the MEDICAL RECORD NUMBER Initial referral from Dr. [name] for RD OS   CURRENT MEDICATIONS: Current Outpatient Medications (Ophthalmic Drugs)  Medication Sig   ketorolac (ACULAR) 0.5 % ophthalmic solution Place 1 drop into the left eye 4 (four) times daily.   prednisoLONE acetate (PRED FORTE) 1 % ophthalmic suspension Place 1 drop into the left eye 4 (four) times daily.   sodium chloride (MURO 128) 5 % ophthalmic solution Place 1 drop into the left eye in the morning and at bedtime.   No current facility-administered medications for this visit. (Ophthalmic Drugs)   Current Outpatient Medications (Other)  Medication Sig   allopurinol (ZYLOPRIM) 300 MG tablet Take 300 mg by mouth daily.   b complex vitamins tablet Take 1 tablet by mouth daily. (Patient not taking: No sig reported)   Cholecalciferol (VITAMIN D3) 50 MCG (2000 UT) TABS Take by mouth. (Patient not taking: No sig reported)   clobetasol (TEMOVATE) 0.05 % external solution Apply 1 application topically daily as needed.   ELIQUIS 5 MG TABS tablet Take 1 tablet by mouth twice daily   Ferric Maltol (ACCRUFER) 30 MG  CAPS    Fluticasone-Umeclidin-Vilant (TRELEGY ELLIPTA) 200-62.5-25 MCG/INH AEPB Inhale 1 puff into the lungs daily.   gabapentin (NEURONTIN) 300 MG capsule Take 300 mg by mouth 2 (two) times daily.   guaiFENesin (MUCINEX) 600 MG 12 hr tablet Take by mouth.   hydrOXYzine (ATARAX/VISTARIL) 10 MG tablet See admin instructions.   ketoconazole (NIZORAL) 2 % shampoo Apply 1 application topically every other day.   mirtazapine (REMERON) 7.5 MG tablet Take 7.5 mg by mouth at bedtime.   Multiple Vitamin (MULTIVITAMIN WITH MINERALS) TABS tablet Take 1 tablet by mouth daily. (Patient not taking: No sig reported)   NON FORMULARY    nystatin-triamcinolone (MYCOLOG II) cream SMARTSIG:Sparingly Topical 2-3 Times Daily PRN   pantoprazole (PROTONIX) 40 MG tablet 1 tablet   polyethylene glycol (MIRALAX / GLYCOLAX) 17 g packet Take 17 g by mouth daily.   pramoxine (CERAVE ITCH RELIEF) 1 % LOTN Apply 1 application topically 2 (two) times daily as needed (itching/dry skin.).   riluzole (RILUTEK) 50 MG tablet Take 1 tablet (50 mg total) by mouth every 12 (twelve) hours.   triamcinolone (KENALOG) 0.1 % Apply 1 application topically 2 (two) times daily as needed (skin irritation.).   No current facility-administered medications for this visit. (Other)      REVIEW OF SYSTEMS: ROS   Positive for: Gastrointestinal, Musculoskeletal, Cardiovascular, Eyes, Respiratory Negative for: Constitutional,  Neurological, Skin, Genitourinary, HENT, Endocrine, Psychiatric, Allergic/Imm, Heme/Lymph Last edited by Kingsley Spittle, COT on 01/28/2021  2:32 PM.         ALLERGIES Allergies  Allergen Reactions   Lipitor [Atorvastatin] Rash and Other (See Comments)    Passed out    PAST MEDICAL HISTORY Past Medical History:  Diagnosis Date   ALS (amyotrophic lateral sclerosis) ([place]) 2021   Anemia    low iron   Arthritis    Atypical chest pain 01/04/2020   Colonic polyp    Constipation    COPD (chronic obstructive  pulmonary disease) (HCC)    Coronary artery calcification seen on CAT scan 01/04/2020   GERD (gastroesophageal reflux disease)    Gout    History of kidney stones    Kidney failure    Malignant tumor of kidney ([place])    Muscle atrophy    Pain    LOWER BACK AND LEFT HIP - HX OF PREVIOUS LUMBAR AND CERVICAL SURGERY--PT STATES HE HAS HAD NUMBNESS IN BOTH FEET SINCE HIS BACK SURGERY   Pure hypercholesterolemia 01/04/2020   Renal calculus    Retinal detachment    Shortness of breath 01/04/2020   Past Surgical History:  Procedure Laterality Date   BACK SURGERY     LUMBAR SURGERY   CATARACT EXTRACTION Bilateral 2003   Surgeon unknown   CYSTOSCOPY W/ URETERAL STENT PLACEMENT Left 05/30/2020   Procedure: CYSTOSCOPY URETERAL STENT PLACEMENT;  Surgeon: Remi Haggard, MD;  Location: [facility];  Service: Urology;  Laterality: Left;   CYSTOSCOPY/URETEROSCOPY/HOLMIUM LASER/STENT PLACEMENT Left 06/18/2020   Procedure: CYSTOSCOPY/URETEROSCOPY/HOLMIUM LASER/STENT EXCHANGE;  Surgeon: Remi Haggard, MD;  Location: [facility];  Service: Urology;  Laterality: Left;  1 HR   EYE SURGERY     BILATERAL CATARACT EXTRACTIONS   GIVENS CAPSULE STUDY N/A 10/07/2015   Procedure: GIVENS CAPSULE STUDY;  Surgeon: Carol Ada, MD;  Location: [facility];  Service: Endoscopy;  Laterality: N/A;   HAND SURGERY     left   KNEE SURGERY     right   LAMINECTOMY     cervical   LASER PHOTO ABLATION Left 08/19/2017   Procedure: LASER PHOTO ABLATION;  Surgeon: [name], MD;  Location: [place];  Service: Ophthalmology;  Laterality: Left;   MEMBRANE PEEL Left 03/22/2017   Procedure: POSSIBLE MEMBRANE PEEL WITH SILICONE OIL AND PERFLURON;  Surgeon: [name], MD;  Location: [place];  Service: Ophthalmology;  Laterality: Left;   NEPHRECTOMY     PT STATES RIGHT KIDNEY WAS REMOVED   PAROTIDECTOMY Right 06/13/2018   Procedure: RIGHT TOTAL PAROTIDECTOMY;  Surgeon: Leta Baptist, MD;  Location: [place];  Service: ENT;   Laterality: Right;   PARS PLANA VITRECTOMY Left 03/22/2017   Procedure: PARS PLANA VITRECTOMY WITH 25 GAUGE;  Surgeon: [name], MD;  Location: [place];  Service: Ophthalmology;  Laterality: Left;   PARS PLANA VITRECTOMY W/ SCLERAL BUCKLE Left 08/19/2017   Procedure: 19 GAUGE PARS PLANA VITRECTOMY WITH SILCONE OIL REMOVAL;  Surgeon: [name], MD;  Location: [facility];  Service: Ophthalmology;  Laterality: Left;   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   SALIVARY STONE REMOVAL Right 02/21/2019   Procedure: RIGHT PAROTID FISTULA REPAIR;  Surgeon: Leta Baptist, MD;  Location: [facility];  Service: ENT;  Laterality: Right;   SHOULDER SURGERY  2020   SCA   TOTAL KNEE ARTHROPLASTY Left 10/19/2012   Procedure: LEFT TOTAL KNEE ARTHROPLASTY;  Surgeon: Magnus Sinning, MD;  Location[facility];  Service: Orthopedics;  Laterality: Left;   VITRECTOMY 25 GAUGE WITH SCLERAL BUCKLE Left 02/17/2017   Procedure: VITRECTOMY 25 GAUGE WITH SCLERAL BUCKLE membrane peel, injection of silicone oil, and endolaser photocoagulation.;  Surgeon: [name], MD;  Location: [facility];  Service: Ophthalmology;  Laterality: Left;    FAMILY HISTORY Family History  Problem Relation Age of Onset   Diabetes Mother    COPD Brother    COPD Sister    Heart attack Father    Amblyopia Neg Hx    Blindness Neg Hx    Cataracts Neg Hx    Glaucoma Neg Hx    Macular degeneration Neg Hx    Retinal detachment Neg Hx    Retinitis pigmentosa Neg Hx     SOCIAL HISTORY Social History   Tobacco Use   Smoking status: Former    Packs/day: 2.00    Years: 40.00    Pack years: 80.00    Types: Cigarettes, Pipe    Quit date: 06/09/1995    Years since quitting: 25.6   Smokeless tobacco: Never  Vaping Use   Vaping Use: Never used  Substance Use Topics   Alcohol use: No   Drug use: No         OPHTHALMIC EXAM:  Base Eye Exam     Visual Acuity (Snellen - Linear)       Right Left   Dist [place] 20/30 +1 20/100 +1   Dist ph  [place] 20/25 20/70         Tonometry (Tonopen, 2:40 PM)       Right Left   Pressure 8 13         Pupils       Dark Light Shape React APD   Right 3 2 Round Brisk None   Left 5 5 Round Minimal None         Visual Fields (Counting fingers)       Left Right    Full Full         Extraocular Movement       Right Left    Full, Ortho Full, Ortho         Neuro/Psych     Oriented x3: Yes   Mood/Affect: Normal         Dilation     Both eyes: 1.0% Mydriacyl, 2.5% Phenylephrine @ 2:40 PM           Slit Lamp and Fundus Exam     External Exam       Right Left   External Normal Periorbital edema improving         Slit Lamp Exam       Right Left   Lids/Lashes Dermatochalasis - upper lid Dermatochalasis - upper lid   Conjunctiva/Sclera White and quiet White and quiet   Cornea Arcus, mild tear film debris, 1+ Punctate epithelial erosions arcus, tear film debris, trace PEE, mild haze   Anterior Chamber Deep and quiet Deep, quiet   Iris Round and dilated Round and moderately dilated   Lens Three piece Posterior chamber intraocular lens, trace PCO Three piece Posterior chamber intraocular lens in good position, micro condensations on posterior lens surface - stable   Vitreous Vitreous syneresis, PVD Post vitrectomy         Fundus Exam       Right Left   Disc Pink and Sharp, mild Peripapillary atrophy, Compact, mild PPP Sharp rim, mild pallor, temporal Peripapillary atrophy, +cupping   C/D Ratio  0.4 0.6   Macula Flat, good foveal reflex, Retinal pigment epithelial mottling - mild, No heme or edema flat; Blunted foveal reflex, mild cystic changes temporal macula - persistent, no heme   Vessels Vascular attenuation, Tortuous Vascular attenuation, mild tortuosity   Periphery Attached, mild Reticular degeneration; no RT or RD Attached, moderate buckle height; temporal/inf retinectomy from 2-630 with peripheral edge attached with good laser at the edge; trace  areas of fibrosis inferiorly and superior temporal -- surrounded by good laser           Refraction     Wearing Rx       Sphere Cylinder Axis Add   Right -0.75 +1.00 151 +2.50   Left -0.50 +1.00 020 +2.50    Type: PAL            IMAGING AND PROCEDURES  Imaging and Procedures for 09/19/17  OCT, Retina - OU - Both Eyes       Right Eye Quality was good. Central Foveal Thickness: 245. Progression has been stable. Findings include normal foveal contour, no IRF, no SRF (Partial PVD).   Left Eye Quality was good. Central Foveal Thickness: 464. Progression has been stable. Findings include no SRF, epiretinal membrane, intraretinal fluid, abnormal foveal contour (Patchy ORA, persistent IRF/CME temporal macula).   Notes Images taken, stored on drive  Diagnosis / Impression:  OD: NFP, No IRF, No SRF, partial PVD OS: retina attached with oil out - Patchy ORA; Patchy ORA, persistent IRF/CME temporal macula  Clinical management:  See below  Abbreviations: NFP - Normal foveal profile. CME - cystoid macular edema. PED - pigment epithelial detachment. IRF - intraretinal fluid. SRF - subretinal fluid. EZ - ellipsoid zone. ERM - epiretinal membrane. ORA - outer retinal atrophy. ORT - outer retinal tubulation. SRHM - subretinal hyper-reflective material       Injection into Tenon's Capsule - OS - Left Eye       Time Out 01/28/2021. 3:54 PM. Confirmed correct patient, procedure, site, and patient consented.   Anesthesia Topical anesthesia was used. Anesthetic medications included Lidocaine 2%, Proparacaine 0.5%.   Procedure Preparation included 5% betadine to ocular surface, eyelid speculum. A 27 gauge needle was used.   Injection: 40 mg triamcinolone acetonide 40 MG/ML   Route: Other, Site: Left Eye   [place]: [id number], Lot: [id number], Expiration date: 05/07/2021   Post-op Post injection exam found visual acuity of at least counting fingers. The patient tolerated the  procedure well. There were no complications. The patient received written and verbal post procedure care education. Post injection medications were not given (Polymyxin B sulfate and trimethoprim).   Notes 0.9 cc of Kenalog-40 (36 mg) injected into subtenon's capsule in the superotemporal quadrant. Betadine was applied to Injection area pre and post-injection then rinsed with sterile BSS. 1 drop of polymixin was instilled into the eye. There were no complications. Pt tolerated procedure well.             ASSESSMENT/PLAN:    ICD-10-CM   1. 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 Cystoid macular edema of left eye  H35.352 Injection into Tenon's Capsule - OS - Left Eye    triamcinolone acetonide (KENALOG-40) injection 40 mg    4. Retinal edema  H35.81 OCT, Retina - OU - Both Eyes    5. Pseudophakia, both eyes  Z96.1     6. Corneal edema of left eye  H18.20  7. Pseudophakia of left eye  Z96.1       1-4. 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23.20 to 03.23.21 due to new diagnosis of motor neuron disease/ALS  - retina attached and in good position  - s/p STK OS #1 (07.14.20), #2 (3.23.21), #3 (06.21.21), #4 (10.7.21)  - currently on PF QID and Ketorolac QID OS for CME  - OCT today shows petrsistent IRF/CME temporal macula  OS  - BCVA 20/70 OS -- stable             - recommend STK OS #5 today, 08.23.22             - Pt wishes to proceed with injection  - RBA of procedure discussed, questions answered - informed consent obtained and signed - see procedure note - corneal edema slightly improved-- stay on muro 128 OS  - continue PF QID and ketorolac QID OS  - IOP 13 today -- good off Alph[place] P  - f/u 4 wks for DFE, OCT  5. Pseudophakia OU  - IOLs in good position OU  - monitor  6. Corneal edema OS -- improved  - persistent post op corneal edema OS as above  - cont Muro 128 BID as above  Ophthalmic Meds Ordered this visit:  Meds ordered this encounter  Medications   triamcinolone acetonide (KENALOG-40) injection 40 mg       Return in about 4 weeks (around 02/25/2021) for f/u CME OS, DFE, OCT.  There are no Patient Instructions on file for this visit.  This document serves as a record of services personally performed by Gardiner Sleeper, MD, PhD. It was created on their behalf by Leeann Must, [place], an ophthalmic technician. The creation of this record is the provider's dictation and/or activities during the visit.    Electronically signed by: Leeann Must, COA _0 @ 11:32 PM  Gardiner Sleeper, M.D., Ph.D. Diseases & Surgery of the Retina and [place] 01/28/2021   I have reviewed the above documentation for accuracy and completeness, and I agree with the above. Gardiner Sleeper, M.D., Ph.D. 01/28/21 11:35 PM  Abbreviations: M myopia (nearsighted); A astigmatism; H hyperopia (farsighted); P presbyopia; Mrx spectacle prescription;  CTL contact lenses; OD right eye; OS left eye; OU both eyes  XT exotropia; ET esotropia; PEK punctate epithelial keratitis; PEE punctate epithelial erosions; DES dry eye syndrome; MGD meibomian gland dysfunction; ATs artificial tears; PFAT's preservative free artificial tears; [place] nuclear sclerotic cataract; PSC posterior subcapsular  cataract; ERM epi-retinal membrane; PVD posterior vitreous detachment; RD retinal detachment; DM diabetes mellitus; DR diabetic retinopathy; NPDR non-proliferative diabetic retinopathy; PDR proliferative diabetic retinopathy; CSME clinically significant macular edema; DME diabetic macular edema; dbh dot blot hemorrhages; CWS cotton wool spot; POAG primary open angle glaucoma; C/D cup-to-disc ratio; HVF humphrey visual field; GVF goldmann visual field; OCT  optical coherence tomography; IOP intraocular pressure; BRVO Branch retinal vein occlusion; CRVO central retinal vein occlusion; CRAO central retinal artery occlusion; BRAO branch retinal artery occlusion; RT retinal tear; SB scleral buckle; PPV pars plana vitrectomy; VH Vitreous hemorrhage; PRP panretinal laser photocoagulation; IVK intravitreal kenalog; VMT vitreomacular traction; MH Macular hole;  NVD neovascularization of the disc; NVE neovascularization elsewhere; AREDS age related eye disease study; ARMD age related macular degeneration; POAG primary open angle glaucoma; EBMD epithelial/anterior basement membrane dystrophy; ACIOL anterior chamber intraocular lens; IOL intraocular lens; PCIOL posterior chamber intraocular lens; Phaco/IOL phacoemulsification with intraocular lens placement; B and E photorefractive keratectomy; LASIK laser assisted in situ keratomileusis; HTN hypertension; DM diabetes mellitus; COPD chronic obstructive pulmonary disease

## 2021-01-28 ENCOUNTER — Other Ambulatory Visit: Payer: Self-pay

## 2021-01-28 ENCOUNTER — Encounter (INDEPENDENT_AMBULATORY_CARE_PROVIDER_SITE_OTHER): Payer: Self-pay | Admitting: Ophthalmology

## 2021-01-28 ENCOUNTER — Ambulatory Visit (INDEPENDENT_AMBULATORY_CARE_PROVIDER_SITE_OTHER): Payer: Medicare HMO | Admitting: Ophthalmology

## 2021-01-28 DIAGNOSIS — H3322 Serous retinal detachment, left eye: Secondary | ICD-10-CM

## 2021-01-28 DIAGNOSIS — H3581 Retinal edema: Secondary | ICD-10-CM

## 2021-01-28 DIAGNOSIS — H35352 Cystoid macular degeneration, left eye: Secondary | ICD-10-CM

## 2021-01-28 DIAGNOSIS — H1820 Unspecified corneal edema: Secondary | ICD-10-CM | POA: Diagnosis not present

## 2021-01-28 DIAGNOSIS — Z961 Presence of intraocular lens: Secondary | ICD-10-CM | POA: Diagnosis not present

## 2021-01-28 DIAGNOSIS — H3522 Other non-diabetic proliferative retinopathy, left eye: Secondary | ICD-10-CM

## 2021-01-28 MED ORDER — TRIAMCINOLONE ACETONIDE 40 MG/ML IJ SUSP FOR KALEIDOSCOPE
40.0000 mg | INTRAMUSCULAR | Status: AC | PRN
  Administered 2021-01-28: 40 mg

## 2021-01-30 DIAGNOSIS — J9601 Acute respiratory failure with hypoxia: Secondary | ICD-10-CM | POA: Diagnosis not present

## 2021-02-02 ENCOUNTER — Other Ambulatory Visit: Payer: Self-pay | Admitting: Pulmonary Disease

## 2021-02-02 DIAGNOSIS — G1221 Amyotrophic lateral sclerosis: Secondary | ICD-10-CM | POA: Diagnosis not present

## 2021-02-03 DIAGNOSIS — G1221 Amyotrophic lateral sclerosis: Secondary | ICD-10-CM | POA: Diagnosis not present

## 2021-02-05 DIAGNOSIS — K219 Gastro-esophageal reflux disease without esophagitis: Secondary | ICD-10-CM | POA: Diagnosis not present

## 2021-02-05 DIAGNOSIS — M1990 Unspecified osteoarthritis, unspecified site: Secondary | ICD-10-CM | POA: Diagnosis not present

## 2021-02-05 DIAGNOSIS — N183 Chronic kidney disease, stage 3 unspecified: Secondary | ICD-10-CM | POA: Diagnosis not present

## 2021-02-05 DIAGNOSIS — M5416 Radiculopathy, lumbar region: Secondary | ICD-10-CM | POA: Diagnosis not present

## 2021-02-10 DIAGNOSIS — G1221 Amyotrophic lateral sclerosis: Secondary | ICD-10-CM | POA: Diagnosis not present

## 2021-02-19 ENCOUNTER — Other Ambulatory Visit: Payer: Self-pay | Admitting: *Deleted

## 2021-02-19 NOTE — Patient Outreach (Signed)
[facility]) Care Management  02/19/2021  SKYLAND FEIST Jun 01, 1941 [id number]   Outgoing call placed to member's wife, state member has not improved but this is to be expected with ALS diagnosis.  Denies any urgent concerns, encouraged to contact this care manager with questions.  Agrees to follow up within the next month.   Goals Addressed             This Visit's Progress    [facility]) Learn More About My Health   On track    Timeframe:  Long-Range Goal Priority:  Medium Start Date:  5/19                            Expected End Date:  1/19         Barriers: Knowledge  - make a list of questions - ask questions - repeat what I heard to make sure I understand  -continue to monitor urine for hematuria and notify provider for change or worsening -continue to monitor for shortness of breath or hemoptysis    Why is this important?   The best way to learn about your health and care is by talking to the doctor and nurse.  They will answer your questions and give you information in the way that you like best.    Notes:   2/3 - Reviewed current symptoms, encouraged to contact PCP regarding GI concern  3/8 - Education regarding recovery from PE and kidney stones sent  4/18 - Confirmed no further issues with kidney stones, denies shortness of breath from PE, continues taking blood thinners for intervention  5/19 - Wife expressing concern regarding dental hygiene as member's ALS progresses.  Does not have any adaptive equipment, will send education with interventions on how to perform oral care for ALS patients  6/17 - Confirmed wife received education.  Wife also stated she received suggestions from provider at [facility] to use for better oral care.  A motorized wheelchair as well as hospital bed has been ordered.  7/14 - Per daughter, member has hospital bed, still waiting on wheelchair.  They are having a ramp installed at the home as well.  8/11 - Wife report  wheelchair received, no ramp as of yet.  Waiting on call to complete.  Advised if unable to get completed, contact this care manager and referral for care guide will be placed.  Also report member having some drainage from nose during meals, concerned it may be from allergies.  Advised to try nasal spray or discuss allergy medication with PCP.  9/14 - Wife acknowledges she is in need of additional help in the home as member's condition is declining.  They have long term care insurance, awaiting call from Aging and Disability program to schedule home visit for assessment of needs and establish care.  Denies need from this care manager for follow up.  Will follow up with ALS specialist at [facility] next week.     [facility]) Make and Keep All Appointments   On track    Timeframe:  Short-Term Goal Priority:  High Start Date:  5/19         Expected End Date:  8/19        Barriers: Knowledge    - ask family or friend for a ride - call to cancel if needed - keep a calendar with appointment dates  -request assistance from family for transportation to out of town appointments -schedule  post hospital follow up with PCP -attend scheduled post hospital appointment with urologist on 06/17/2020 at 2:45   Why is this important?   Part of staying healthy is seeing the doctor for follow-up care.  If you forget your appointments, there are some things you can do to stay on track.    Notes:   2/3 - Reviewed upcoming appointments, encouraged to keep and attend  3/8 - Discussed upcoming appointments - today for radiology, pulmonology 3/15 and urology 3/18  4/18 - All follow up appointments complete, next ones scheduled for June (PCP, [place], Hematology, and Urology)  5/19 - upcoming appointments at [facility] on 6/14, with PCP on 6/10, and hematology on 6/23  6/17 - Confirmed he attended appointments at [facility] and PCP, hematology appointment remains next week  7/14 - Wife not available to discuss MD visits, will discuss  during next call  8/11 - Follow up with urology complete, no recurrent bleeding or concerns.  Will call to schedule follow up with PCP  9/14 - Has appointment with PCP on 10/24 and with pulmonology on 9/26.        Valente David, [place], MSN [place] [phone]

## 2021-02-25 ENCOUNTER — Encounter (INDEPENDENT_AMBULATORY_CARE_PROVIDER_SITE_OTHER): Payer: Medicare HMO | Admitting: Ophthalmology

## 2021-02-25 DIAGNOSIS — G1221 Amyotrophic lateral sclerosis: Secondary | ICD-10-CM | POA: Diagnosis not present

## 2021-02-25 DIAGNOSIS — Z5181 Encounter for therapeutic drug level monitoring: Secondary | ICD-10-CM | POA: Diagnosis not present

## 2021-02-25 DIAGNOSIS — Z7901 Long term (current) use of anticoagulants: Secondary | ICD-10-CM | POA: Diagnosis not present

## 2021-02-25 DIAGNOSIS — Z7189 Other specified counseling: Secondary | ICD-10-CM | POA: Diagnosis not present

## 2021-02-25 DIAGNOSIS — M6281 Muscle weakness (generalized): Secondary | ICD-10-CM | POA: Diagnosis not present

## 2021-02-25 DIAGNOSIS — Z79899 Other long term (current) drug therapy: Secondary | ICD-10-CM | POA: Diagnosis not present

## 2021-02-25 DIAGNOSIS — J984 Other disorders of lung: Secondary | ICD-10-CM | POA: Diagnosis not present

## 2021-02-25 DIAGNOSIS — R471 Dysarthria and anarthria: Secondary | ICD-10-CM | POA: Diagnosis not present

## 2021-02-25 DIAGNOSIS — G709 Myoneural disorder, unspecified: Secondary | ICD-10-CM | POA: Diagnosis not present

## 2021-02-25 DIAGNOSIS — Z86711 Personal history of pulmonary embolism: Secondary | ICD-10-CM | POA: Diagnosis not present

## 2021-02-25 DIAGNOSIS — J449 Chronic obstructive pulmonary disease, unspecified: Secondary | ICD-10-CM | POA: Diagnosis not present

## 2021-02-25 DIAGNOSIS — J300 Vasomotor rhinitis: Secondary | ICD-10-CM | POA: Diagnosis not present

## 2021-02-26 ENCOUNTER — Encounter (INDEPENDENT_AMBULATORY_CARE_PROVIDER_SITE_OTHER): Payer: Self-pay | Admitting: Ophthalmology

## 2021-02-26 ENCOUNTER — Encounter (INDEPENDENT_AMBULATORY_CARE_PROVIDER_SITE_OTHER): Payer: Medicare HMO | Admitting: Ophthalmology

## 2021-02-26 ENCOUNTER — Ambulatory Visit (INDEPENDENT_AMBULATORY_CARE_PROVIDER_SITE_OTHER): Payer: Medicare HMO | Admitting: Ophthalmology

## 2021-02-26 ENCOUNTER — Other Ambulatory Visit: Payer: Self-pay

## 2021-02-26 DIAGNOSIS — H1820 Unspecified corneal edema: Secondary | ICD-10-CM | POA: Diagnosis not present

## 2021-02-26 DIAGNOSIS — H3581 Retinal edema: Secondary | ICD-10-CM

## 2021-02-26 DIAGNOSIS — H3322 Serous retinal detachment, left eye: Secondary | ICD-10-CM

## 2021-02-26 DIAGNOSIS — H3522 Other non-diabetic proliferative retinopathy, left eye: Secondary | ICD-10-CM

## 2021-02-26 DIAGNOSIS — Z961 Presence of intraocular lens: Secondary | ICD-10-CM

## 2021-02-26 DIAGNOSIS — H35352 Cystoid macular degeneration, left eye: Secondary | ICD-10-CM

## 2021-02-26 NOTE — Progress Notes (Signed)
. [facility] Note  02/26/2021     CHIEF COMPLAINT Patient presents for Retina Follow Up   HISTORY OF PRESENT ILLNESS: Jerome Mays is a 80 y.o. male who presents to the clinic today for:   HPI     Retina Follow Up   Patient presents with  Other.  In left eye.  This started 4 weeks ago.  I, the attending physician,  performed the HPI with the patient and updated documentation appropriately.        Comments   Patient here for 4 weeks retina follow up for CME OS. Patient states vision doing ok. No eye  pain.       Last edited by [name], MD on 03/01/2021 11:55 PM.    No change in [place] noticed.  Referring physician: [name], MD [place],  [place] [address]  HISTORICAL INFORMATION:   Selected notes from the MEDICAL RECORD NUMBER Initial referral from Dr. [name] for RD OS   CURRENT MEDICATIONS: Current Outpatient Medications (Ophthalmic Drugs)  Medication Sig   ketorolac (ACULAR) 0.5 % ophthalmic solution Place 1 drop into the left eye 4 (four) times daily.   prednisoLONE acetate (PRED FORTE) 1 % ophthalmic suspension Place 1 drop into the left eye 4 (four) times daily.   sodium chloride (MURO 128) 5 % ophthalmic solution Place 1 drop into the left eye in the morning and at bedtime.   No current facility-administered medications for this visit. (Ophthalmic Drugs)   Current Outpatient Medications (Other)  Medication Sig   allopurinol (ZYLOPRIM) 300 MG tablet Take 300 mg by mouth daily.   b complex vitamins tablet Take 1 tablet by mouth daily. (Patient not taking: No sig reported)   Cholecalciferol (VITAMIN D3) 50 MCG (2000 UT) TABS Take by mouth. (Patient not taking: No sig reported)   clobetasol (TEMOVATE) 0.05 % external solution Apply 1 application topically daily as needed.   ELIQUIS 5 MG TABS tablet Take 1 tablet by mouth twice daily   Ferric Maltol (ACCRUFER) 30 MG CAPS    Fluticasone-Umeclidin-Vilant  (TRELEGY ELLIPTA) 200-62.5-25 MCG/INH AEPB INHALE 1 PUFF ONCE DAILY   gabapentin (NEURONTIN) 300 MG capsule Take 300 mg by mouth 2 (two) times daily.   guaiFENesin (MUCINEX) 600 MG 12 hr tablet Take by mouth.   hydrOXYzine (ATARAX/VISTARIL) 10 MG tablet See admin instructions.   ketoconazole (NIZORAL) 2 % shampoo Apply 1 application topically every other day.   mirtazapine (REMERON) 7.5 MG tablet Take 7.5 mg by mouth at bedtime.   Multiple Vitamin (MULTIVITAMIN WITH MINERALS) TABS tablet Take 1 tablet by mouth daily. (Patient not taking: No sig reported)   NON FORMULARY    nystatin-triamcinolone (MYCOLOG II) cream SMARTSIG:Sparingly Topical 2-3 Times Daily PRN   pantoprazole (PROTONIX) 40 MG tablet 1 tablet   polyethylene glycol (MIRALAX / GLYCOLAX) 17 g packet Take 17 g by mouth daily.   pramoxine (CERAVE ITCH RELIEF) 1 % LOTN Apply 1 application topically 2 (two) times daily as needed (itching/dry skin.).   riluzole (RILUTEK) 50 MG tablet Take 1 tablet (50 mg total) by mouth every 12 (twelve) hours.   triamcinolone (KENALOG) 0.1 % Apply 1 application topically 2 (two) times daily as needed (skin irritation.).   No current facility-administered medications for this visit. (Other)   REVIEW OF SYSTEMS: ROS   Positive for: Gastrointestinal, Musculoskeletal, Cardiovascular, Eyes, Respiratory Negative for: Constitutional, Neurological, Skin, Genitourinary, HENT, Endocrine, Psychiatric, Allergic/Imm, Heme/Lymph Last edited by Loletta Specter,  Eustace Quail, COA on 02/26/2021  3:07 PM.     ALLERGIES Allergies  Allergen Reactions   Lipitor [Atorvastatin] Rash and Other (See Comments)    Passed out    PAST MEDICAL HISTORY Past Medical History:  Diagnosis Date   ALS (amyotrophic lateral sclerosis) ([facility]) 2021   Anemia    low iron   Arthritis    Atypical chest pain 01/04/2020   Colonic polyp    Constipation    COPD (chronic obstructive pulmonary disease) (HCC)    Coronary artery calcification seen on  CAT scan 01/04/2020   GERD (gastroesophageal reflux disease)    Gout    History of kidney stones    Kidney failure    Malignant tumor of kidney ([place])    Muscle atrophy    Pain    LOWER BACK AND LEFT HIP - HX OF PREVIOUS LUMBAR AND CERVICAL SURGERY--PT STATES HE HAS HAD NUMBNESS IN BOTH FEET SINCE HIS BACK SURGERY   Pure hypercholesterolemia 01/04/2020   Renal calculus    Retinal detachment    Shortness of breath 01/04/2020   Past Surgical History:  Procedure Laterality Date   BACK SURGERY     LUMBAR SURGERY   CATARACT EXTRACTION Bilateral 2003   Surgeon unknown   CYSTOSCOPY W/ URETERAL STENT PLACEMENT Left 05/30/2020   Procedure: CYSTOSCOPY URETERAL STENT PLACEMENT;  Surgeon: Remi Haggard, MD;  Location: [facility];  Service: Urology;  Laterality: Left;   CYSTOSCOPY/URETEROSCOPY/HOLMIUM LASER/STENT PLACEMENT Left 06/18/2020   Procedure: CYSTOSCOPY/URETEROSCOPY/HOLMIUM LASER/STENT EXCHANGE;  Surgeon: Remi Haggard, MD;  Location: [facility];  Service: Urology;  Laterality: Left;  1 HR   EYE SURGERY     BILATERAL CATARACT EXTRACTIONS   GIVENS CAPSULE STUDY N/A 10/07/2015   Procedure: GIVENS CAPSULE STUDY;  Surgeon: Carol Ada, MD;  Location[facility];  Service: Endoscopy;  Laterality: N/A;   HAND SURGERY     left   KNEE SURGERY     right   LAMINECTOMY     cervical   LASER PHOTO ABLATION Left 08/19/2017   Procedure: LASER PHOTO ABLATION;  Surgeon: [name], MD;  Location: [facility];  Service: Ophthalmology;  Laterality: Left;   MEMBRANE PEEL Left 03/22/2017   Procedure: POSSIBLE MEMBRANE PEEL WITH SILICONE OIL AND PERFLURON;  Surgeon: [name], MD;  Location: [facility];  Service: Ophthalmology;  Laterality: Left;   NEPHRECTOMY     PT STATES RIGHT KIDNEY WAS REMOVED   PAROTIDECTOMY Right 06/13/2018   Procedure: RIGHT TOTAL PAROTIDECTOMY;  Surgeon: Leta Baptist, MD;  Location: [facility];  Service: ENT;  Laterality: Right;   PARS PLANA VITRECTOMY Left 03/22/2017    Procedure: PARS PLANA VITRECTOMY WITH 25 GAUGE;  Surgeon: [name], MD;  Location: [facility];  Service: Ophthalmology;  Laterality: Left;   PARS PLANA VITRECTOMY W/ SCLERAL BUCKLE Left 08/19/2017   Procedure: 78 GAUGE PARS PLANA VITRECTOMY WITH SILCONE OIL REMOVAL;  Surgeon: [name], MD;  Location: [facility];  Service: Ophthalmology;  Laterality: Left;   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   SALIVARY STONE REMOVAL Right 02/21/2019   Procedure: RIGHT PAROTID FISTULA REPAIR;  Surgeon: Leta Baptist, MD;  Location: [facility];  Service: ENT;  Laterality: Right;   SHOULDER SURGERY  2020   SCA   TOTAL KNEE ARTHROPLASTY Left 10/19/2012   Procedure: LEFT TOTAL KNEE ARTHROPLASTY;  Surgeon: Magnus Sinning, MD;  Location[facility];  Service: Orthopedics;  Laterality: Left;   VITRECTOMY 25 GAUGE WITH SCLERAL BUCKLE Left  02/17/2017   Procedure: VITRECTOMY 25 GAUGE WITH SCLERAL BUCKLE membrane peel, injection of silicone oil, and endolaser photocoagulation.;  Surgeon: [name], MD;  Location: [facility];  Service: Ophthalmology;  Laterality: Left;    FAMILY HISTORY Family History  Problem Relation Age of Onset   Diabetes Mother    COPD Brother    COPD Sister    Heart attack Father    Amblyopia Neg Hx    Blindness Neg Hx    Cataracts Neg Hx    Glaucoma Neg Hx    Macular degeneration Neg Hx    Retinal detachment Neg Hx    Retinitis pigmentosa Neg Hx     SOCIAL HISTORY Social History   Tobacco Use   Smoking status: Former    Packs/day: 2.00    Years: 40.00    Pack years: 80.00    Types: Cigarettes, Pipe    Quit date: 06/09/1995    Years since quitting: 25.7   Smokeless tobacco: Never  Vaping Use   Vaping Use: Never used  Substance Use Topics   Alcohol use: No   Drug use: No         OPHTHALMIC EXAM:  Base Eye Exam     Visual Acuity (Snellen - Linear)       Right Left   Dist [place] 20/20 20/80 -1   Dist ph [place]  NI         Tonometry (Tonopen, 3:05 PM)       Right Left    Pressure 12 15         Pupils       Dark Light Shape React APD   Right 3 2 Round Brisk None   Left 5 5 Round Minimal None         Visual Fields (Counting fingers)       Left Right    Full Full         Extraocular Movement       Right Left    Full, Ortho Full, Ortho         Neuro/Psych     Oriented x3: Yes   Mood/Affect: Normal         Dilation     Both eyes: 1.0% Mydriacyl, 2.5% Phenylephrine @ 3:05 PM           Slit Lamp and Fundus Exam     External Exam       Right Left   External Normal Periorbital edema improving         Slit Lamp Exam       Right Left   Lids/Lashes Dermatochalasis - upper lid Dermatochalasis - upper lid   Conjunctiva/Sclera White and quiet White and quiet, STK ST quad   Cornea Arcus, mild tear film debris, 1+ Punctate epithelial erosions arcus, tear film debris, trace PEE, mild haze   Anterior Chamber Deep and quiet Deep, quiet   Iris Round and dilated Round and moderately dilated   Lens Three piece Posterior chamber intraocular lens, trace PCO Three piece Posterior chamber intraocular lens in good position, micro condensations on posterior lens surface - stable   Vitreous Vitreous syneresis, PVD Post vitrectomy         Fundus Exam       Right Left   Disc pink & Sharp, mild Peripapillary atrophy, Compact, mild PPP Sharp rim, mild pallor, temporal Peripapillary atrophy, +cupping   C/D Ratio 0.4 0.6   Macula Flat, good foveal reflex, Retinal pigment epithelial mottling -  mild, No heme or edema flat; Blunted foveal reflex, mild cystic changes temporal macula - improved, no heme   Vessels Vascular attenuation, Tortuous Vascular attenuation, mild tortuosity   Periphery Attached, mild Reticular degeneration; no RT or RD Attached, moderate buckle height; temporal/inf retinectomy from 2-630 with peripheral edge attached with good laser at the edge; trace areas of fibrosis inferiorly and superior temporal -- surrounded by  good laser           Refraction     Wearing Rx       Sphere Cylinder Axis Add   Right -0.75 +1.00 151 +2.50   Left -0.50 +1.00 020 +2.50    Type: PAL            IMAGING AND PROCEDURES  Imaging and Procedures for 09/19/17  OCT, Retina - OU - Both Eyes       Right Eye Quality was good. Central Foveal Thickness: 246. Progression has been stable. Findings include normal foveal contour, no IRF, no SRF (Partial PVD).   Left Eye Quality was good. Central Foveal Thickness: 332. Progression has improved. Findings include no SRF, epiretinal membrane, intraretinal fluid, abnormal foveal contour (Patchy ORA, interval improvement in IRF/CME temporal macula).   Notes Images taken, stored on drive  Diagnosis / Impression:  OD: NFP, No IRF, No SRF, partial PVD OS: retina attached with oil out - Patchy ORA; Patchy ORA, interval improvement in IRF/CME temporal macula  Clinical management:  See below  Abbreviations: NFP - Normal foveal profile. CME - cystoid macular edema. PED - pigment epithelial detachment. IRF - intraretinal fluid. SRF - subretinal fluid. EZ - ellipsoid zone. ERM - epiretinal membrane. ORA - outer retinal atrophy. ORT - outer retinal tubulation. SRHM - subretinal hyper-reflective material             ASSESSMENT/PLAN:    ICD-10-CM   1. 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 Cystoid macular edema of left eye  H35.352     4. Retinal edema  H35.81 OCT, Retina - OU - Both Eyes    5. Pseudophakia, both eyes  Z96.1     6. Corneal edema of left eye  H18.20       1-4. 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  - inferior detachment from 2-10 oclock with HST at ~530  - fovea off, but sup nasal macula attached  - by history RD likely started 4-5 wks prior to initial presentation, but fovea became involved ~Saturday, 02/13/17  - s/p SBP (42 band) + 25g PPV/PFC/EL/FAX/SO OS 9.12.18  -  extensive PVR along inf temp arcades with tractional redetachment inf temp noted POM1  - s/p PPV w/ membrane peel/relaxing retinectomy/PFC/SOX OS under GA, 10.15.18  - s/p PPV/SOR/EL OS, 3.14.19  - lost to f/u from 12.19.19 to 7.14.20 due to shoulder and parotid surgeries and COVID-19 restrictions  - lost to f/u from 09.23.20 to 03.23.21 due to new diagnosis of motor neuron disease/ALS  - retina attached and in good position  - s/p STK OS #1 (07.14.20), #2 (3.23.21), #3 (06.21.21), #4 (10.07.21), #5 (08.23.22)  - currently on PF QID and Ketorolac QID OS for CME  - OCT today shows interval improvement in IRF/CME temporal macula  OS  - BCVA 20/80 OS -- decreased - corneal edema slightly improved-- stay on muro 128 OS  - continue PF QID and ketorolac QID OS  - IOP 15 today --  good off [place]ph[place] P  - f/u 8 wks for DFE, OCT  5. Pseudophakia OU  - IOLs in good position OU  - monitor  6. Corneal edema OS -- improved  - persistent post op corneal edema OS as above  - cont Muro 128 BID as above  Ophthalmic Meds Ordered this visit:  No orders of the defined types were placed in this encounter.      Return in about 8 weeks (around 04/23/2021) for f/u CME OS, DFE, OCT.  There are no Patient Instructions on file for this visit.  This document serves as a record of services personally performed by Gardiner Sleeper, MD, PhD. It was created on their behalf by San Jetty. Owens Shark, OA an ophthalmic technician. The creation of this record is the provider's dictation and/or activities during the visit.    Electronically signed by: San Jetty. Owens Shark, [place] 09.21.2022 12:15 AM   Gardiner Sleeper, M.D., Ph.D. Diseases & Surgery of the Retina and Vitreous [facility]  I have reviewed the above documentation for accuracy and completeness, and I agree with the above. Gardiner Sleeper, M.D., Ph.D. 03/02/21 12:27 AM  Abbreviations: M myopia (nearsighted); A astigmatism; H hyperopia  (farsighted); P presbyopia; Mrx spectacle prescription;  CTL contact lenses; OD right eye; OS left eye; OU both eyes  XT exotropia; ET esotropia; PEK punctate epithelial keratitis; PEE punctate epithelial erosions; DES dry eye syndrome; MGD meibomian gland dysfunction; ATs artificial tears; PFAT's preservative free artificial tears; [place] nuclear sclerotic cataract; PSC posterior subcapsular cataract; ERM epi-retinal membrane; PVD posterior vitreous detachment; RD retinal detachment; DM diabetes mellitus; DR diabetic retinopathy; NPDR non-proliferative diabetic retinopathy; PDR proliferative diabetic retinopathy; CSME clinically significant macular edema; DME diabetic macular edema; dbh dot blot hemorrhages; CWS cotton wool spot; POAG primary open angle glaucoma; C/D cup-to-disc ratio; HVF humphrey visual field; GVF goldmann visual field; OCT optical coherence tomography; IOP intraocular pressure; BRVO Branch retinal vein occlusion; CRVO central retinal vein occlusion; CRAO central retinal artery occlusion; BRAO branch retinal artery occlusion; RT retinal tear; SB scleral buckle; PPV pars plana vitrectomy; VH Vitreous hemorrhage; PRP panretinal laser photocoagulation; IVK intravitreal kenalog; VMT vitreomacular traction; MH Macular hole;  NVD neovascularization of the disc; NVE neovascularization elsewhere; AREDS age related eye disease study; ARMD age related macular degeneration; POAG primary open angle glaucoma; EBMD epithelial/anterior basement membrane dystrophy; ACIOL anterior chamber intraocular lens; IOL intraocular lens; PCIOL posterior chamber intraocular lens; Phaco/IOL phacoemulsification with intraocular lens placement; [place] photorefractive keratectomy; LASIK laser assisted in situ keratomileusis; HTN hypertension; DM diabetes mellitus; COPD chronic obstructive pulmonary disease

## 2021-03-01 ENCOUNTER — Encounter (INDEPENDENT_AMBULATORY_CARE_PROVIDER_SITE_OTHER): Payer: Self-pay | Admitting: Ophthalmology

## 2021-03-02 DIAGNOSIS — J9601 Acute respiratory failure with hypoxia: Secondary | ICD-10-CM | POA: Diagnosis not present

## 2021-03-03 ENCOUNTER — Encounter: Payer: Self-pay | Admitting: Pulmonary Disease

## 2021-03-03 ENCOUNTER — Ambulatory Visit: Payer: Medicare HMO | Admitting: Pulmonary Disease

## 2021-03-03 ENCOUNTER — Other Ambulatory Visit: Payer: Self-pay

## 2021-03-03 VITALS — BP 116/64 | HR 73 | Temp 98.0°F | Ht 73.0 in | Wt 151.0 lb

## 2021-03-03 DIAGNOSIS — G1221 Amyotrophic lateral sclerosis: Secondary | ICD-10-CM

## 2021-03-03 DIAGNOSIS — J449 Chronic obstructive pulmonary disease, unspecified: Secondary | ICD-10-CM

## 2021-03-03 DIAGNOSIS — J9611 Chronic respiratory failure with hypoxia: Secondary | ICD-10-CM

## 2021-03-03 NOTE — Progress Notes (Signed)
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 He has 80-pack-year smoking history.  Quit in 1997.  Symptoms of cough, chest tightness, congestion for the past week after he was exposed to pollen and dust while mowing hay. He denies any fevers, chills, purulent sputum. He feels that the Daliresp is affecting his sleep and he is taking zzquil at night to fall asleep. He had an evaluation in January 2018 for minor hemoptysis. He had a CT scan of the chest which did not show any obvious source of hemoptysis.   Taken off Daliresp in 2018 as he cannot afford the cost and due to side effects of insomnia. Diagnosed with ALS in April 2021 and follows at [facility]. Currently on riluzole therapy. Continues follow-up at [facility].  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  He has been noncompliant with the nocturnal ventilator due to issues with the mask  He was hospitalized in December 2021 for obstructive uropathy, PE and started on anticoagulation.  CT at that time showed an ill-defined focal opacity in the right lung apex.  He had a follow-up CT earlier this week that showed resolution of the apical opacity and a new subcentimeter pulmonary nodule.  Interim History: He continues to have difficulty using the trilogy ventilator due to issues with mask.  He has been prescribed nasal pillows but has been noncompliant He does not like to use the CoughAssist device due to discomfort  Followed up at [facility] earlier this week.  I have reviewed extensive notes from daughter.  Recommendation has been made to consider hospice/palliation  due to progressive nature of disease and poor options for therapy  Chief complaint is worsening dyspnea, has leg and arm edema due to immobility.  Outpatient Encounter Medications as of 03/03/2021  Medication Sig   allopurinol (ZYLOPRIM) 300 MG tablet Take 300 mg by mouth daily.   b complex vitamins tablet Take 1 tablet by mouth daily.   Cholecalciferol (VITAMIN D3) 50 MCG (2000 UT) TABS Take by mouth.   clobetasol (TEMOVATE) 0.05 % external solution Apply 1 application topically daily as needed.   ELIQUIS 5 MG TABS tablet Take 1 tablet by mouth twice daily   Ferric Maltol (ACCRUFER) 30 MG CAPS    Fluticasone-Umeclidin-Vilant (TRELEGY ELLIPTA) 200-62.5-25 MCG/INH AEPB INHALE 1 PUFF ONCE DAILY   gabapentin (NEURONTIN) 300 MG capsule Take 300 mg by mouth 2 (two) times daily.   guaiFENesin (MUCINEX) 600 MG 12 hr tablet Take by mouth.   hydrOXYzine (ATARAX/VISTARIL) 10 MG tablet See admin instructions.   ipratropium (ATROVENT) 0.03 % nasal spray Place into the nose.   ketoconazole (NIZORAL) 2 % shampoo Apply 1 application topically every other day.   ketorolac (ACULAR) 0.5 % ophthalmic solution Place 1 drop into the left eye 4 (four) times daily.   mirtazapine (REMERON) 7.5 MG tablet Take 7.5 mg by mouth at bedtime.   Multiple Vitamin (MULTIVITAMIN WITH MINERALS) TABS tablet Take 1 tablet by mouth daily.   NON FORMULARY    nystatin-triamcinolone (MYCOLOG II) cream SMARTSIG:Sparingly Topical 2-3 Times Daily PRN  pantoprazole (PROTONIX) 40 MG tablet 1 tablet   polyethylene glycol (MIRALAX / GLYCOLAX) 17 g packet Take 17 g by mouth daily.   pramoxine (CERAVE ITCH RELIEF) 1 % LOTN Apply 1 application topically 2 (two) times daily as needed (itching/dry skin.).   prednisoLONE acetate (PRED FORTE) 1 % ophthalmic suspension Place 1 drop into the left eye 4 (four) times daily.   riluzole (RILUTEK) 50 MG tablet Take 1 tablet (50 mg total) by mouth every 12 (twelve) hours.   sodium chloride (MURO 128)  5 % ophthalmic solution Place 1 drop into the left eye in the morning and at bedtime.   triamcinolone (KENALOG) 0.1 % Apply 1 application topically 2 (two) times daily as needed (skin irritation.). (Patient not taking: Reported on 03/03/2021)   No facility-administered encounter medications on file as of 03/03/2021.   Physical Exam: Blood pressure 116/64, pulse 73, temperature 98 F (36.7 C), temperature source Oral, height 6\' 1"  (1.854 m), weight 151 lb (68.5 kg), SpO2 97 %. Gen:      No acute distress HEENT:  EOMI, sclera anicteric Neck:     No masses; no thyromegaly Lungs:    Clear to auscultation bilaterally; normal respiratory effort CV:         Regular rate and rhythm; no murmurs Abd:      + bowel sounds; soft, non-tender; no palpable masses, no distension Ext:    1+ edema; adequate peripheral perfusion Skin:      Warm and dry; no rash Neuro: alert and oriented x 3 Psych: normal mood and affect   Data Reviewed: Imaging CT scan 10/21/09- Moderate centrilobar emphysema. No pulmonary masses, opacities, asbestos disease or consolidation. Stable enlarged. Stable enlarged mediastinal and bilateral hilar lymph nodes unchanged for past 3.5 years compatible with benign or reactive process.  CT scan 06/19/16-emphysema, no consolidation or lung opacity. Mediastinal and bilateral hilar lymphadenopathy stable in size. Clustered left upper lobe nodules.  CT scan 07/01/17- stable subcentimeter pulmonary nodules, stable calcified mediastinal, hilar lymphadenopathy.  CT scan 07/25/2019-stable pulmonary nodules, calcified mediastinal, hilar lymphadenopathy  CTA 02/16/2020-no PE, emphysema, stable pulmonary nodules  CTA 05/29/2020-2 subsegmental pulmonary emboli in the right middle lobe and lower lobes.  Ill-defined pulmonary opacity in the right lung apex, emphysema.  CT chest 08/13/2020-resolution of right apical opacity, new 6 mm right lower lobe nodule, emphysema.  CTA 11/21/2020-stable pulmonary  nodule, emphysema, atelectasis. I have reviewed the images personally.  PFTs  02/04/16 FVC 4.05 [9%), FEV1 2.40 (70%), F/F 59, TLC 101%, DLCO 35% Moderate obstructive defect with severe reduction in diffusion capacity.  09/12/2019 [[facility]] FVC 3.34 [78%], FEV1 1.89 [60%], F/F 57  01/23/2020 [[facility]] FVC 3.42 [80%], FEV1 1.72 [55%], F/F 50  Labs CBC 05/21/17-WBC 7.2, eosinophils 3.7%, absolute eos count 266 Blood allergy profile 05/21/17-IgE 22, RAST panel is negative.     Assessment:  Gold stage B COPD. Continue trelegy, supplemental oxygen  ALS Follows at [facility] on noninvasive vent at night but is noncompliant.  Discussed need to keep on the therapy given progression of neuromuscular weakness and restrictive lung disease. He has also been prescribed a mechanical cough assist device but is noncompliant with this as well He is not interested in tracheostomy  Agree with [facility] assessment that given progressive nature of disease and poor options for treatment could be appropriate to consider hospice.  He is already in touch with some hospice facilities in [place].  I will make a referral to.  Care Confirmed DNR status with him.  Health maintenance 01/08/2014-Prevnar 13 06/09/2007-Pneumovax  Plan/Recommendations: - Continue trelegy inhaler, oxygen - Encouraged to be compliant with trelegy, cough assist device - Referral to hospice - DNR  Marshell Garfinkel MD [facility] 03/03/2021, 12:08 PM  CC: [name], MD

## 2021-03-03 NOTE — Patient Instructions (Signed)
I have reviewed the notes from [place] I agree that given progressive nature of this illness it breo appropriate to consider palliative/hospice therapy  Will make referral to [facility] for further assessment Follow-up in 6 months

## 2021-03-03 NOTE — Addendum Note (Signed)
Addended byMarshell Garfinkel on: 03/03/2021 12:31 PM   Modules accepted: Orders

## 2021-03-05 DIAGNOSIS — G1221 Amyotrophic lateral sclerosis: Secondary | ICD-10-CM | POA: Diagnosis not present

## 2021-03-06 ENCOUNTER — Telehealth: Payer: Self-pay | Admitting: Nurse Practitioner

## 2021-03-06 DIAGNOSIS — G1221 Amyotrophic lateral sclerosis: Secondary | ICD-10-CM | POA: Diagnosis not present

## 2021-03-06 NOTE — Telephone Encounter (Signed)
Spoke with patient's wife, [name], regarding the Palliative referral/services and all questions were answered and she was in agreement with scheduling visit.  I have scheduled an In-home Consult for 03/26/21 @ 9 AM.

## 2021-03-08 ENCOUNTER — Encounter (HOSPITAL_COMMUNITY): Payer: Self-pay | Admitting: Family Medicine

## 2021-03-08 ENCOUNTER — Other Ambulatory Visit: Payer: Self-pay

## 2021-03-08 ENCOUNTER — Inpatient Hospital Stay (HOSPITAL_COMMUNITY)
Admission: EM | Admit: 2021-03-08 | Discharge: 2021-03-11 | DRG: 177 | Disposition: A | Discharge status: Hospice | Payer: Medicare HMO | Attending: Family Medicine | Admitting: Family Medicine

## 2021-03-08 ENCOUNTER — Emergency Department (HOSPITAL_COMMUNITY): Payer: Medicare HMO

## 2021-03-08 DIAGNOSIS — M6281 Muscle weakness (generalized): Secondary | ICD-10-CM | POA: Diagnosis present

## 2021-03-08 DIAGNOSIS — R531 Weakness: Secondary | ICD-10-CM

## 2021-03-08 DIAGNOSIS — D649 Anemia, unspecified: Secondary | ICD-10-CM | POA: Diagnosis present

## 2021-03-08 DIAGNOSIS — R54 Age-related physical debility: Secondary | ICD-10-CM | POA: Diagnosis present

## 2021-03-08 DIAGNOSIS — Z833 Family history of diabetes mellitus: Secondary | ICD-10-CM | POA: Diagnosis not present

## 2021-03-08 DIAGNOSIS — J9621 Acute and chronic respiratory failure with hypoxia: Secondary | ICD-10-CM | POA: Diagnosis not present

## 2021-03-08 DIAGNOSIS — G9341 Metabolic encephalopathy: Secondary | ICD-10-CM | POA: Diagnosis not present

## 2021-03-08 DIAGNOSIS — G1221 Amyotrophic lateral sclerosis: Secondary | ICD-10-CM | POA: Diagnosis not present

## 2021-03-08 DIAGNOSIS — Z87891 Personal history of nicotine dependence: Secondary | ICD-10-CM

## 2021-03-08 DIAGNOSIS — R4182 Altered mental status, unspecified: Secondary | ICD-10-CM | POA: Diagnosis not present

## 2021-03-08 DIAGNOSIS — Z91199 Patient's noncompliance with other medical treatment and regimen due to unspecified reason: Secondary | ICD-10-CM | POA: Diagnosis not present

## 2021-03-08 DIAGNOSIS — Z7901 Long term (current) use of anticoagulants: Secondary | ICD-10-CM

## 2021-03-08 DIAGNOSIS — Z8249 Family history of ischemic heart disease and other diseases of the circulatory system: Secondary | ICD-10-CM | POA: Diagnosis not present

## 2021-03-08 DIAGNOSIS — M109 Gout, unspecified: Secondary | ICD-10-CM | POA: Diagnosis present

## 2021-03-08 DIAGNOSIS — Z79899 Other long term (current) drug therapy: Secondary | ICD-10-CM

## 2021-03-08 DIAGNOSIS — J449 Chronic obstructive pulmonary disease, unspecified: Secondary | ICD-10-CM | POA: Diagnosis not present

## 2021-03-08 DIAGNOSIS — N401 Enlarged prostate with lower urinary tract symptoms: Secondary | ICD-10-CM | POA: Diagnosis present

## 2021-03-08 DIAGNOSIS — Z66 Do not resuscitate: Secondary | ICD-10-CM | POA: Diagnosis present

## 2021-03-08 DIAGNOSIS — R0602 Shortness of breath: Secondary | ICD-10-CM | POA: Diagnosis present

## 2021-03-08 DIAGNOSIS — E7800 Pure hypercholesterolemia, unspecified: Secondary | ICD-10-CM | POA: Diagnosis present

## 2021-03-08 DIAGNOSIS — J690 Pneumonitis due to inhalation of food and vomit: Secondary | ICD-10-CM | POA: Diagnosis not present

## 2021-03-08 DIAGNOSIS — T68XXXA Hypothermia, initial encounter: Secondary | ICD-10-CM | POA: Diagnosis present

## 2021-03-08 DIAGNOSIS — R253 Fasciculation: Secondary | ICD-10-CM | POA: Diagnosis present

## 2021-03-08 DIAGNOSIS — Z888 Allergy status to other drugs, medicaments and biological substances status: Secondary | ICD-10-CM

## 2021-03-08 DIAGNOSIS — Z515 Encounter for palliative care: Secondary | ICD-10-CM | POA: Diagnosis not present

## 2021-03-08 DIAGNOSIS — G1220 Motor neuron disease, unspecified: Secondary | ICD-10-CM | POA: Diagnosis present

## 2021-03-08 DIAGNOSIS — J9622 Acute and chronic respiratory failure with hypercapnia: Secondary | ICD-10-CM | POA: Diagnosis not present

## 2021-03-08 DIAGNOSIS — J9611 Chronic respiratory failure with hypoxia: Secondary | ICD-10-CM | POA: Diagnosis not present

## 2021-03-08 DIAGNOSIS — M1990 Unspecified osteoarthritis, unspecified site: Secondary | ICD-10-CM | POA: Diagnosis present

## 2021-03-08 DIAGNOSIS — Z20822 Contact with and (suspected) exposure to covid-19: Secondary | ICD-10-CM | POA: Diagnosis not present

## 2021-03-08 DIAGNOSIS — Z8719 Personal history of other diseases of the digestive system: Secondary | ICD-10-CM | POA: Diagnosis not present

## 2021-03-08 DIAGNOSIS — I10 Essential (primary) hypertension: Secondary | ICD-10-CM | POA: Diagnosis not present

## 2021-03-08 DIAGNOSIS — I2510 Atherosclerotic heart disease of native coronary artery without angina pectoris: Secondary | ICD-10-CM | POA: Diagnosis present

## 2021-03-08 DIAGNOSIS — R680 Hypothermia, not associated with low environmental temperature: Secondary | ICD-10-CM | POA: Diagnosis present

## 2021-03-08 DIAGNOSIS — R404 Transient alteration of awareness: Secondary | ICD-10-CM | POA: Diagnosis not present

## 2021-03-08 DIAGNOSIS — Z9911 Dependence on respirator [ventilator] status: Secondary | ICD-10-CM | POA: Diagnosis not present

## 2021-03-08 DIAGNOSIS — J90 Pleural effusion, not elsewhere classified: Secondary | ICD-10-CM | POA: Diagnosis not present

## 2021-03-08 DIAGNOSIS — I440 Atrioventricular block, first degree: Secondary | ICD-10-CM | POA: Diagnosis not present

## 2021-03-08 DIAGNOSIS — J4489 Other specified chronic obstructive pulmonary disease: Secondary | ICD-10-CM | POA: Diagnosis present

## 2021-03-08 DIAGNOSIS — I959 Hypotension, unspecified: Secondary | ICD-10-CM | POA: Diagnosis not present

## 2021-03-08 DIAGNOSIS — Z743 Need for continuous supervision: Secondary | ICD-10-CM | POA: Diagnosis not present

## 2021-03-08 DIAGNOSIS — Z825 Family history of asthma and other chronic lower respiratory diseases: Secondary | ICD-10-CM | POA: Diagnosis not present

## 2021-03-08 DIAGNOSIS — Z85528 Personal history of other malignant neoplasm of kidney: Secondary | ICD-10-CM

## 2021-03-08 DIAGNOSIS — K219 Gastro-esophageal reflux disease without esophagitis: Secondary | ICD-10-CM | POA: Diagnosis present

## 2021-03-08 DIAGNOSIS — K5900 Constipation, unspecified: Secondary | ICD-10-CM | POA: Diagnosis present

## 2021-03-08 DIAGNOSIS — M6250 Muscle wasting and atrophy, not elsewhere classified, unspecified site: Secondary | ICD-10-CM | POA: Diagnosis present

## 2021-03-08 DIAGNOSIS — Z9981 Dependence on supplemental oxygen: Secondary | ICD-10-CM

## 2021-03-08 DIAGNOSIS — Z7951 Long term (current) use of inhaled steroids: Secondary | ICD-10-CM

## 2021-03-08 DIAGNOSIS — Z96652 Presence of left artificial knee joint: Secondary | ICD-10-CM | POA: Diagnosis present

## 2021-03-08 DIAGNOSIS — R52 Pain, unspecified: Secondary | ICD-10-CM | POA: Diagnosis not present

## 2021-03-08 LAB — CBC WITH DIFFERENTIAL/PLATELET
Abs Immature Granulocytes: 0.04 10*3/uL (ref 0.00–0.07)
Basophils Absolute: 0 10*3/uL (ref 0.0–0.1)
Basophils Relative: 0 %
Eosinophils Absolute: 0 10*3/uL (ref 0.0–0.5)
Eosinophils Relative: 0 %
HCT: 38.8 % — ABNORMAL LOW (ref 39.0–52.0)
Hemoglobin: 11 g/dL — ABNORMAL LOW (ref 13.0–17.0)
Immature Granulocytes: 1 %
Lymphocytes Relative: 5 %
Lymphs Abs: 0.4 10*3/uL — ABNORMAL LOW (ref 0.7–4.0)
MCH: 30.9 pg (ref 26.0–34.0)
MCHC: 28.4 g/dL — ABNORMAL LOW (ref 30.0–36.0)
MCV: 109 fL — ABNORMAL HIGH (ref 80.0–100.0)
Monocytes Absolute: 0.4 10*3/uL (ref 0.1–1.0)
Monocytes Relative: 4 %
Neutro Abs: 7.5 10*3/uL (ref 1.7–7.7)
Neutrophils Relative %: 90 %
Platelets: 166 10*3/uL (ref 150–400)
RBC: 3.56 MIL/uL — ABNORMAL LOW (ref 4.22–5.81)
RDW: 17.6 % — ABNORMAL HIGH (ref 11.5–15.5)
WBC: 8.3 10*3/uL (ref 4.0–10.5)
nRBC: 0 % (ref 0.0–0.2)

## 2021-03-08 LAB — COMPREHENSIVE METABOLIC PANEL
ALT: 18 U/L (ref 0–44)
AST: 21 U/L (ref 15–41)
Albumin: 3.6 g/dL (ref 3.5–5.0)
Alkaline Phosphatase: 73 U/L (ref 38–126)
Anion gap: 5 (ref 5–15)
BUN: 16 mg/dL (ref 8–23)
CO2: 37 mmol/L — ABNORMAL HIGH (ref 22–32)
Calcium: 9.6 mg/dL (ref 8.9–10.3)
Chloride: 102 mmol/L (ref 98–111)
Creatinine, Ser: 0.65 mg/dL (ref 0.61–1.24)
GFR, Estimated: >60 mL/min (ref >60)
Glucose, Bld: 230 mg/dL — ABNORMAL HIGH (ref 70–99)
Potassium: 3.9 mmol/L (ref 3.5–5.1)
Sodium: 144 mmol/L (ref 135–145)
Total Bilirubin: 0.6 mg/dL (ref 0.3–1.2)
Total Protein: 7 g/dL (ref 6.5–8.1)

## 2021-03-08 LAB — BLOOD GAS, VENOUS
Acid-Base Excess: 8.8 mmol/L — ABNORMAL HIGH (ref 0.0–2.0)
Bicarbonate: 29.7 mmol/L — ABNORMAL HIGH (ref 20.0–28.0)
FIO2: 21
O2 Saturation: 97.6 %
Patient temperature: 36.9
pCO2, Ven: 119 mmHg (ref 44.0–60.0)
pH, Ven: 7.131 — CL (ref 7.250–7.430)
pO2, Ven: 181 mmHg — ABNORMAL HIGH (ref 32.0–45.0)

## 2021-03-08 LAB — BLOOD GAS, ARTERIAL
Acid-Base Excess: 9.8 mmol/L — ABNORMAL HIGH (ref 0.0–2.0)
Bicarbonate: 33.4 mmol/L — ABNORMAL HIGH (ref 20.0–28.0)
FIO2: 35
O2 Saturation: 97 %
Patient temperature: 37
pCO2 arterial: 45.1 mmHg (ref 32.0–48.0)
pH, Arterial: 7.487 — ABNORMAL HIGH (ref 7.350–7.450)
pO2, Arterial: 95.8 mmHg (ref 83.0–108.0)

## 2021-03-08 LAB — RESP PANEL BY RT-PCR (FLU A&B, COVID) ARPGX2
Influenza A by PCR: NEGATIVE
Influenza B by PCR: NEGATIVE
SARS Coronavirus 2 by RT PCR: NEGATIVE

## 2021-03-08 LAB — ETHANOL: Alcohol, Ethyl (B): <10 mg/dL (ref <10)

## 2021-03-08 LAB — CBG MONITORING, ED: Glucose-Capillary: 204 mg/dL — ABNORMAL HIGH (ref 70–99)

## 2021-03-08 LAB — LACTIC ACID, PLASMA: Lactic Acid, Venous: 0.6 mmol/L (ref 0.5–1.9)

## 2021-03-08 LAB — PROTIME-INR
INR: 0.9 (ref 0.8–1.2)
Prothrombin Time: 12.5 seconds (ref 11.4–15.2)

## 2021-03-08 MED ORDER — ACETAMINOPHEN 650 MG RE SUPP: 650.0000 mg | Freq: Four times a day (QID) | RECTAL | Status: DC | PRN

## 2021-03-08 MED ORDER — ONDANSETRON HCL 4 MG/2ML IJ SOLN: 4.0000 mg | Freq: Four times a day (QID) | INTRAMUSCULAR | Status: DC | PRN

## 2021-03-08 MED ORDER — SODIUM CHLORIDE 0.9 % IV BOLUS
500.0000 mL | Freq: Once | INTRAVENOUS | Status: AC
  Administered 2021-03-08: 500 mL via INTRAVENOUS

## 2021-03-08 MED ORDER — LACTATED RINGERS IV BOLUS
1000.0000 mL | Freq: Once | INTRAVENOUS | Status: AC
  Administered 2021-03-08: 1000 mL via INTRAVENOUS

## 2021-03-08 MED ORDER — POLYVINYL ALCOHOL 1.4 % OP SOLN: 1.0000 [drp] | Freq: Four times a day (QID) | OPHTHALMIC | Status: DC | PRN

## 2021-03-08 MED ORDER — LORAZEPAM 1 MG PO TABS: 1.0000 mg | ORAL_TABLET | ORAL | Status: DC | PRN

## 2021-03-08 MED ORDER — SODIUM CHLORIDE 0.9 % IV SOLN
100.0000 mg | Freq: Two times a day (BID) | INTRAVENOUS | Status: DC
  Administered 2021-03-08 – 2021-03-09 (×4): 100 mg via INTRAVENOUS
  Filled 2021-03-08 (×6): qty 100

## 2021-03-08 MED ORDER — ORAL CARE MOUTH RINSE
15.0000 mL | Freq: Two times a day (BID) | OROMUCOSAL | Status: DC
  Administered 2021-03-10: 15 mL via OROMUCOSAL

## 2021-03-08 MED ORDER — ONDANSETRON HCL 4 MG PO TABS
4.0000 mg | ORAL_TABLET | Freq: Four times a day (QID) | ORAL | Status: DC | PRN
Start: 2021-03-08 — End: 2021-03-08

## 2021-03-08 MED ORDER — GLYCOPYRROLATE 1 MG PO TABS
1.0000 mg | ORAL_TABLET | ORAL | Status: DC | PRN
  Filled 2021-03-08: qty 1

## 2021-03-08 MED ORDER — GLYCOPYRROLATE 0.2 MG/ML IJ SOLN: 0.2000 mg | INTRAMUSCULAR | Status: DC | PRN

## 2021-03-08 MED ORDER — LORAZEPAM 2 MG/ML IJ SOLN
1.0000 mg | INTRAMUSCULAR | Status: DC | PRN
  Administered 2021-03-08: 1 mg via INTRAVENOUS
  Filled 2021-03-08: qty 1

## 2021-03-08 MED ORDER — IPRATROPIUM-ALBUTEROL 0.5-2.5 (3) MG/3ML IN SOLN
3.0000 mL | RESPIRATORY_TRACT | Status: DC | PRN
Start: 2021-03-08 — End: 2021-03-11

## 2021-03-08 MED ORDER — CHLORHEXIDINE GLUCONATE 0.12 % MT SOLN
15.0000 mL | Freq: Two times a day (BID) | OROMUCOSAL | Status: DC
  Administered 2021-03-08 – 2021-03-10 (×3): 15 mL via OROMUCOSAL
  Filled 2021-03-08 (×4): qty 15

## 2021-03-08 MED ORDER — LORAZEPAM 2 MG/ML PO CONC: 1.0000 mg | ORAL | Status: DC | PRN

## 2021-03-08 MED ORDER — ONDANSETRON 4 MG PO TBDP: 4.0000 mg | ORAL_TABLET | Freq: Four times a day (QID) | ORAL | Status: DC | PRN

## 2021-03-08 MED ORDER — ACETAMINOPHEN 325 MG PO TABS: 650.0000 mg | ORAL_TABLET | Freq: Four times a day (QID) | ORAL | Status: DC | PRN

## 2021-03-08 NOTE — TOC Initial Note (Signed)
Transition of Care [facility]) - Initial/Assessment Note    Patient Details  Name: Jerome Mays MRN: [id number] Date of Birth: 1940/07/17  Transition of Care [facility]) CM/SW Contact:    Boneta Lucks, RN Phone Number: 03/08/2021, 11:06 AM  Clinical Narrative:      Patient in ED with Acute on chronic respiratory failure, ALS. Currently on BIPAP, TOC consulted for residential hospice.  TOC referred to [facility] and [facility]. Both places are full and put patient on the waiting list. RN/MD updated.       Expected Discharge Plan: Hospice Medical Facility Barriers to Discharge: Other (must enter comment) (No Hospice Beds)     Expected Discharge Plan and Services Expected Discharge Plan: [place]      Admission diagnosis:  Acute and chronic respiratory failure with hypercapnia ([place]) [J96.22] Patient Active Problem List   Diagnosis Date Noted   Acute and chronic respiratory failure with hypercapnia ([place]) 03/08/2021   DNR (do not resuscitate) 03/08/2021   Hypothermia 03/08/2021   Hypotensive episode 03/08/2021   Noncompliance 03/08/2021   Ventilator dependent ([place]) 03/08/2021   Acute cystitis with hematuria 07/03/2020   UTI (urinary tract infection) 06/23/2020   Chronic respiratory failure with hypoxia ([place]) 06/12/2020   AKI (acute kidney injury) ([place]) 05/30/2020   Pulmonary emboli ([place]) 05/29/2020   Hydroureteronephrosis 05/29/2020   Mild protein-calorie malnutrition ([place]) 05/29/2020   GERD (gastroesophageal reflux disease)    ALS (amyotrophic lateral sclerosis) ([place]) 02/13/2020   Atypical chest pain 01/04/2020   Coronary artery calcification seen on CAT scan 01/04/2020   Pure hypercholesterolemia 01/04/2020   Shortness of breath 01/04/2020   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   Weakness 05/15/2019   Muscular atrophy 05/15/2019   Fasciculation 05/15/2019   Muscle weakness (generalized) 05/15/2019   H/O parotidectomy 06/13/2018   Recurrent  nephrolithiasis 03/26/2018   COPD, group B, by GOLD 2017 classification ([place]) 02/02/2017   Hemoptysis 06/18/2016   Benign prostatic hyperplasia with lower urinary tract symptoms 02/07/2016   Stiffness of left knee 11/08/2012   Difficulty in walking(719.7) 11/08/2012   Gout 03/04/2012   Renal cell cancer ([place]) 01/27/2012   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   ENLARGEMENT OF LYMPH NODES 08/21/2009   CLOS FRACTURE MID/PROXIMAL PHALANX/PHALANG HAND 07/31/2009   NEOPLASM, MALIGNANT, 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   COPD with chronic bronchitis ([place]) 12/06/2007   COLONIC POLYPS, HX OF 12/05/2007   RENAL CALCULUS, HX OF 12/05/2007   PCP:  [name], MD Pharmacy:   [place], [place] - [address] [place] [id number] [place] [address] Phone: [phone] Fax: [phone]  Readmission Risk Interventions Readmission Risk Prevention Plan 06/26/2020  Post Dischage Appt Complete  Medication Screening Complete  Transportation Screening Complete  Some recent data might be hidden

## 2021-03-08 NOTE — Progress Notes (Signed)
Pt transferred from ED-19 to LKZ-89 without complication.

## 2021-03-08 NOTE — Progress Notes (Signed)
Patient taken off bipap per request and per verbal from Dr Wynetta Emery so patient can eat dinner and drink fluids. Patient placed on 3 Liters O2 via nasal cannula and mouth care provided. Patient aware to be placed back on bipap a couple hours after eating. Patient currently has no complaints.

## 2021-03-08 NOTE — H&P (Addendum)
History and Physical  Jerome Mays:[id number] DOB: 08/24/1940 DOA: 03/08/2021  PCP: [name], MD  Patient coming from: Home by EMS  Level of care: Stepdown  I have personally briefly reviewed patient's old medical records in [place]  Chief Complaint: altered mental status   HPI: Jerome Mays is a 80 y.o. 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   He saw his pulmonologist about 5 days ago and there is nothing further treatment wise that they could offer and recommended hospice care and made him DNR with signed .  In the last couple of days he has become increasingly somnolent, still not using his home ventilator.  He became increasingly lethargic and stuporous and EMS was called and he was taken to the ED.   ED Course: Pt arrived severely obtunded. ABG with findings of pH 7.131, pCO2 119, he had temp of 98.3, then repeated at 94.2 rectal, placed on warming blanket, BP 73/46, RR 22, pulse ox 100%.  Glucose 204, sodium 144, potassium 3.9, CO2 37, creatinine 0.65.  WBC 8.3, hemoglobin 11.0, platelet count 166.  Influenza test negative.  SARS 2 coronavirus test negative.  Lactic acid 0.6.  CT head with no acute findings.  Chest x-ray with patchy opacity in the right midlung.  Review of Systems: Review of Systems  Unable to perform ROS: Mental status change    Past Medical History:  Diagnosis Date   ALS (amyotrophic lateral sclerosis) ([place]) 2021   Anemia    low iron   Arthritis    Atypical chest pain 01/04/2020   Colonic polyp    Constipation    COPD (chronic obstructive pulmonary disease) (HCC)    Coronary artery calcification seen on CAT scan 01/04/2020   GERD (gastroesophageal reflux disease)    Gout    History of kidney stones    Kidney failure    Malignant tumor of kidney ([place])    Muscle atrophy     Pain    LOWER BACK AND LEFT HIP - HX OF PREVIOUS LUMBAR AND CERVICAL SURGERY--PT STATES HE HAS HAD NUMBNESS IN BOTH FEET SINCE HIS BACK SURGERY   Pure hypercholesterolemia 01/04/2020   Renal calculus    Retinal detachment    Shortness of breath 01/04/2020    Past Surgical History:  Procedure Laterality Date   BACK SURGERY     LUMBAR SURGERY   CATARACT EXTRACTION Bilateral 2003   Surgeon unknown   CYSTOSCOPY W/ URETERAL STENT PLACEMENT Left 05/30/2020   Procedure: CYSTOSCOPY URETERAL STENT PLACEMENT;  Surgeon: Remi Haggard, MD;  Location: [facility];  Service: Urology;  Laterality: Left;   CYSTOSCOPY/URETEROSCOPY/HOLMIUM LASER/STENT PLACEMENT Left 06/18/2020   Procedure: CYSTOSCOPY/URETEROSCOPY/HOLMIUM LASER/STENT EXCHANGE;  Surgeon: Remi Haggard, MD;  Location: [facility];  Service: Urology;  Laterality: Left;  1 HR   EYE SURGERY     BILATERAL CATARACT EXTRACTIONS   GIVENS CAPSULE STUDY N/A 10/07/2015   Procedure: GIVENS CAPSULE STUDY;  Surgeon: Carol Ada, MD;  Location: [facility] END[facility]COPY;  Service: Endoscopy;  Laterality: N/A;   HAND SURGERY     left   KNEE SURGERY     right   LAMINECTOMY     cervical   LASER PHOTO ABLATION Left 08/19/2017   Procedure: LASER PHOTO ABLATION;  Surgeon: [name], MD;  Location: [place];  Service: Ophthalmology;  Laterality: Left;   MEMBRANE PEEL Left 03/22/2017   Procedure: POSSIBLE MEMBRANE PEEL WITH SILICONE OIL AND PERFLURON;  Surgeon: [name], MD;  Location: [place];  Service: Ophthalmology;  Laterality: Left;   NEPHRECTOMY     PT STATES RIGHT KIDNEY WAS REMOVED   PAROTIDECTOMY Right 06/13/2018   Procedure: RIGHT TOTAL PAROTIDECTOMY;  Surgeon: Leta Baptist, MD;  Location: [place];  Service: ENT;  Laterality: Right;   PARS PLANA VITRECTOMY Left 03/22/2017   Procedure: PARS PLANA VITRECTOMY WITH 25 GAUGE;  Surgeon: [name], MD;  Location: [place];  Service: Ophthalmology;  Laterality: Left;   PARS PLANA VITRECTOMY W/ SCLERAL  BUCKLE Left 08/19/2017   Procedure: 73 GAUGE PARS PLANA VITRECTOMY WITH SILCONE OIL REMOVAL;  Surgeon: [name], MD;  Location: [place];  Service: Ophthalmology;  Laterality: Left;   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   SALIVARY STONE REMOVAL Right 02/21/2019   Procedure: RIGHT PAROTID FISTULA REPAIR;  Surgeon: Leta Baptist, MD;  Location: [place];  Service: ENT;  Laterality: Right;   SHOULDER SURGERY  2020   SCA   TOTAL KNEE ARTHROPLASTY Left 10/19/2012   Procedure: LEFT TOTAL KNEE ARTHROPLASTY;  Surgeon: Magnus Sinning, MD;  [facility];  Service: Orthopedics;  Laterality: Left;   VITRECTOMY 25 GAUGE WITH SCLERAL BUCKLE Left 02/17/2017   Procedure: VITRECTOMY 25 GAUGE WITH SCLERAL BUCKLE membrane peel, injection of silicone oil, and endolaser photocoagulation.;  Surgeon: [name], MD;  Location: [place];  Service: Ophthalmology;  Laterality: Left;     reports that he quit smoking about 25 years ago. His smoking use included cigarettes and pipe. He has a 80.00 pack-year smoking history. He has never used smokeless tobacco. He reports that he does not drink alcohol and does not use drugs.  Allergies  Allergen Reactions   Lipitor [Atorvastatin] Rash and Other (See Comments)    Passed out    Family History  Problem Relation Age of Onset   Diabetes Mother    COPD Brother    COPD Sister    Heart attack Father    Amblyopia Neg Hx    Blindness Neg Hx    Cataracts Neg Hx    Glaucoma Neg Hx    Macular degeneration Neg Hx    Retinal detachment Neg Hx    Retinitis pigmentosa Neg Hx     Prior to Admission medications   Medication Sig Start Date End Date Taking? Authorizing Provider  allopurinol (ZYLOPRIM) 300 MG tablet Take 300 mg by mouth daily.    [provider]  b complex vitamins tablet Take 1 tablet by mouth daily.    [provider]  Cholecalciferol (VITAMIN D3) 50 MCG (2000 UT) TABS Take by mouth.    [provider]  clobetasol  (TEMOVATE) 0.05 % external solution Apply 1 application topically daily as needed. 06/11/20   [provider]  ELIQUIS 5 MG TABS tablet Take 1 tablet by mouth twice daily 11/28/20   Mannam, Hart Robinsons, MD  Ferric Maltol (ACCRUFER) 30 MG CAPS  07/12/20   [provider]  Fluticasone-Umeclidin-Vilant (TRELEGY ELLIPTA) 200-62.5-25 MCG/INH AEPB INHALE 1 PUFF ONCE DAILY 02/03/21   Mannam, Praveen, MD  gabapentin (NEURONTIN) 300 MG capsule Take 300 mg by mouth 2 (two) times daily. 07/12/20 07/12/21  [provider]  guaiFENesin (MUCINEX) 600 MG 12 hr tablet Take by mouth.    [provider]  hydrOXYzine (ATARAX/VISTARIL) 10 MG tablet See admin instructions.    [provider]  ipratropium (ATROVENT) 0.03 % nasal spray Place into the nose. 02/25/21 04/26/21  [provider]  ketoconazole (NIZORAL) 2 % shampoo Apply 1 application topically every other day. 06/12/20   [provider]  ketorolac (ACULAR) 0.5 % ophthalmic solution Place 1 drop into the left eye 4 (four) times daily. 08/27/20 08/27/21  [name], MD  mirtazapine (REMERON) 7.5 MG tablet Take 7.5 mg by mouth at bedtime. 08/06/20   [provider]  Multiple Vitamin (MULTIVITAMIN WITH MINERALS) TABS tablet Take 1 tablet by mouth daily.    [provider]  NON FORMULARY     [provider]  nystatin-triamcinolone (MYCOLOG II) cream SMARTSIG:Sparingly Topical 2-3 Times Daily PRN 08/07/20   [provider]  pantoprazole (PROTONIX) 40 MG tablet 1 tablet    [provider]  polyethylene glycol (MIRALAX / GLYCOLAX) 17 g packet Take 17 g by mouth daily.    [provider]  pramoxine (CERAVE ITCH RELIEF) 1 % LOTN Apply 1 application topically 2 (two) times daily as needed (itching/dry skin.).    [provider]  prednisoLONE acetate (PRED FORTE) 1 % ophthalmic suspension Place 1 drop into the left eye 4 (four) times daily. 08/27/20   [name], MD   riluzole (RILUTEK) 50 MG tablet Take 1 tablet (50 mg total) by mouth every 12 (twelve) hours. 07/06/19   Marcial Pacas, MD  sodium chloride (MURO 128) 5 % ophthalmic solution Place 1 drop into the left eye in the morning and at bedtime. 06/26/20   Kayon Dozier L, MD  triamcinolone (KENALOG) 0.1 % Apply 1 application topically 2 (two) times daily as needed (skin irritation.). Patient not taking: Reported on 03/03/2021 06/12/20   [provider]    Physical Exam: Vitals:   03/08/21 0500 03/08/21 0530 03/08/21 0633 03/08/21 0825  BP: 132/64 (!) 125/55  (!) 73/46  Pulse: 73 70 73 (!) 58  Resp:  17 (!) 36 (!) 22  Temp:   (!) 94.2 F (34.6 C)   TempSrc:   Rectal   SpO2: 100% 100% 99% 92%    Constitutional: obtunded male lying in bed.  He appears terminal.   Eyes: PERRL, lids and conjunctivae normal ENMT: Mucous membranes are dry.  Neck: normal, supple, no masses, no thyromegaly Respiratory: on bipap, poor air movement.  Cardiovascular: normal s1, s2 sounds. No carotid bruits.  Abdomen: no tenderness, no masses palpated. No hepatosplenomegaly. Bowel sounds positive.  Musculoskeletal: no clubbing / cyanosis.  Skin: no rashes, lesions, ulcers. No induration Psychiatric: unable to determine as he is obtunded.   Labs on Admission: I have personally reviewed following labs and imaging studies  CBC: Recent Labs  Lab 03/08/21 0454  WBC 8.3  NEUTROABS 7.5  HGB 11.0*  HCT 38.8*  MCV 109.0*  PLT 740   Basic Metabolic Panel: Recent Labs  Lab 03/08/21 0454  NA 144  K 3.9  CL 102  CO2 37*  GLUCOSE 230*  BUN 16  CREATININE 0.65  CALCIUM 9.6   GFR: Estimated Creatinine Clearance: 70.2 mL/min (by C-G formula based on SCr of 0.65 mg/dL). Liver Function Tests: Recent Labs  Lab 03/08/21 0454  AST 21  ALT 18  ALKPHOS 73  BILITOT 0.6  PROT 7.0  ALBUMIN 3.6   No results for input(s): LIPASE, AMYLASE in the last 168 hours. No results for input(s): AMMONIA in the last  168 hours. Coagulation Profile: Recent Labs  Lab 03/08/21 0454  INR 0.9   Cardiac Enzymes:  No results for input(s): CKTOTAL, CKMB, CKMBINDEX, TROPONINI in the last 168 hours. BNP (last 3 results) No results for input(s): PROBNP in the last 8760 hours. HbA1C: No results for input(s): HGBA1C in the last 72 hours. CBG: Recent Labs  Lab 03/08/21 0515  GLUCAP 204*   Lipid Profile: No results for input(s): CHOL, HDL, LDLCALC, TRIG, CHOLHDL, LDLDIRECT in the last 72 hours. Thyroid Function Tests: No results for input(s): TSH, T4TOTAL, FREET4, T3FREE, THYROIDAB in the last 72 hours. Anemia Panel: No results for input(s): VITAMINB12, FOLATE, FERRITIN, TIBC, IRON, RETICCTPCT in the last 72 hours. Urine analysis:    Component Value Date/Time   COLORURINE RED (A) 06/23/2020 1551   APPEARANCEUR [place]y (A) 09/06/2020 1134   LABSPEC 1.013 06/23/2020 1551   PHURINE 7.0 06/23/2020 1551   GLUCOSEU Negative 09/06/2020 1134   HGBUR LARGE (A) 06/23/2020 1551   BILIRUBINUR Negative 09/06/2020 [place] 06/23/2020 1551   PROTEINUR 1+ (A) 09/06/2020 1134   PROTEINUR 100 (A) 06/23/2020 1551   NITRITE Negative 09/06/2020 1134   NITRITE NEGATIVE 06/23/2020 1551   LEUKOCYTESUR 1+ (A) 09/06/2020 1134   LEUKOCYTESUR MODERATE (A) 06/23/2020 1551    Radiological Exams on Admission: CT HEAD WO CONTRAST  Result Date: 03/08/2021 CLINICAL DATA:  Mental status change with unknown cause. EXAM: CT HEAD WITHOUT CONTRAST TECHNIQUE: Contiguous axial images were obtained from the base of the skull through the vertex without intravenous contrast. COMPARISON:  Brain MRI 06/17/2019 FINDINGS: Brain: No evidence of acute infarction, hemorrhage, hydrocephalus, extra-axial collection or mass lesion/mass effect. Mild periventricular low-density. Preserved brain volume for age Vascular: No hyperdense vessel or unexpected calcification. Skull: Normal. Negative for fracture or focal lesion. Sinuses/Orbits: No  acute finding. Bilateral cataract resection and left scleral banding IMPRESSION: Unremarkable study for age. Electronically Signed   By: Jorje Guild M.D.   On: 03/08/2021 04:44   DG Chest Portable 1 View  Result Date: 03/08/2021 CLINICAL DATA:  Evaluate for infection. Acute mental status change. Smoker. EXAM: PORTABLE CHEST 1 VIEW COMPARISON:  CT scan November 21, 2020.  Chest x-ray May 29, 2020. FINDINGS: The cardiomediastinal silhouette is stable. A small left effusion with underlying opacity is more pronounced in the interval. No pneumothorax. No nodular mass. Coarsened lung markings are seen bilaterally. Patchy opacity seen in the right mid lung. IMPRESSION: 1. Patchy opacity in the right mid lung may represent developing pneumonia in the appropriate clinical situation. Recommend clinical correlation and attention on follow-up. 2. Diffuse coarsened lung markings have increased since December 2021 suggesting bronchitic change or atypical infection. 3. Small left effusion with underlying opacity, likely atelectasis, slightly larger in the interval. Electronically Signed   By: Dorise Bullion III M.D.   On: 03/08/2021 05:07    Assessment/Plan Principal Problem:   Acute and chronic respiratory failure with hypercapnia (HCC) Active Problems:   COPD with chronic bronchitis (HCC)   Weakness   Muscular atrophy   Fasciculation   Muscle weakness (generalized)   Motor neuron disease (HCC)   Coronary artery calcification seen on CAT scan   Shortness of breath   ALS (amyotrophic lateral sclerosis) (HCC)   GERD (gastroesophageal reflux disease)   Benign prostatic hyperplasia with lower urinary tract symptoms   Chronic respiratory failure with hypoxia (HCC)   DNR (do not resuscitate)   Hypothermia   Hypotensive episode   Noncompliance   Ventilator dependent ([place])   Acute on chronic respiratory failure with hypercapnia-secondary to end-stage ALS and patient refusal to wear home ventilator.  I  explained to the patient's family that without using a home ventilator he is not going to survive.  He appears to be terminal at this time.  He is appropriately DNR given no chance of any meaningful recovery from end stage ALS and refusal to wear the ventilator.  He is actively dying.  I had a conversation with wife and daughter and they would like for him to go to residential hospice as they do not feel like they can care for him at home in this condition.  We will keep him in the hospital for now until we can make arrangements for him to go to residential hospice.  We will try to keep him comfortable.  I explained to family that if you are becomes more alert as his PCO2 comes down on BiPAP he may refuse to wear BiPAP again and we would not force him to wear it.  They verbalized understanding.  Probable aspiration pneumonia-continue IV antibiotics for now per family request and follow blood cultures.  I explained to family that this would not change the overall trajectory of his dying.  They verbalized understanding.  Hypotension and hypothermia-patient is actively dying and we will focus on keeping him comfortable.  He is DNR and we will respect his wishes.  The plan is to get him to residential hospice per family request.  Ventilator dependence-patient continues to refuse to use his home ventilator.  Unfortunately this has led to his current condition with a markedly elevated PCO2 and respiratory acidosis.  I explained to the patient's family that we cannot force him to wear a BiPAP if he is not willing.  When he becomes more alert and wakes up as his PCO2 trends down he may request to have the BiPAP removed.   ALS-patient unfortunately is at end stages of his disease process and recently saw pulmonologist and there was nothing further that could be added to his treatment plan.  He is actively dying.  Working on residential hospice placement.   Critical Care Procedure Note Authorized and Performed by:  Murvin Natal MD  Total Critical Care time:  65 mins  Due to a high probability of clinically significant, life threatening deterioration, the patient required my highest level of preparedness to intervene emergently and I personally spent this critical care time directly and personally managing the patient.  This critical care time included obtaining a history; examining the patient, pulse oximetry; ordering and review of studies; arranging urgent treatment with development of a management plan; evaluation of patient's response of treatment; frequent reassessment; and discussions with other providers.  This critical care time was performed to assess and manage the high probability of imminent and life threatening deterioration that could result in multi-organ failure.  It was exclusive of separately billable procedures and treating other patients and teaching time.   DVT prophylaxis: hospice comfort care   Code Status: DNR   Family Communication: wife/daughter updated at bedside, discussed at length plan of care   Disposition Plan: residential hospice placement if possible   Consults called:   Admission status: OBV   Level of care: Stepdown Irwin Brakeman MD [facility] How to contact the [facility] Attending or Consulting provider [place] or covering provider during after hours [place], for this patient?  Check the care team in [facility] and look for a) attending/consulting [facility] provider listed and b) the [facility] team listed Log into [email or web address] and use 's universal password to access. If you do not have  the password, please contact the hospital operator. Locate the [facility] provider you are looking for under [facility] and page to a number that you can be directly reached. If you still have difficulty reaching the provider, please page the [facility] (Director on Call) for the Hospitalists listed on amion for assistance.   If 7PM-7AM, please contact night-coverage [email or web address] Password  [email or web address]  03/08/2021, 9:03 AM

## 2021-03-08 NOTE — Plan of Care (Signed)
Patient is moribund. Comfort measures in place.    Problem: Education: Goal: Knowledge of General Education information will improve Description: Including pain rating scale, medication(s)/side effects and non-pharmacologic comfort measures Outcome: Progressing   Problem: Health Behavior/Discharge Planning: Goal: Ability to manage health-related needs will improve Outcome: Progressing   Problem: Clinical Measurements: Goal: Ability to maintain clinical measurements within normal limits will improve Outcome: Progressing Goal: Will remain free from infection Outcome: Progressing Goal: Diagnostic test results will improve Outcome: Progressing Goal: Respiratory complications will improve Outcome: Progressing Goal: Cardiovascular complication will be avoided Outcome: Progressing   Problem: Activity: Goal: Risk for activity intolerance will decrease Outcome: Progressing   Problem: Nutrition: Goal: Adequate nutrition will be maintained Outcome: Progressing   Problem: Coping: Goal: Level of anxiety will decrease Outcome: Progressing   Problem: Elimination: Goal: Will not experience complications related to bowel motility Outcome: Progressing Goal: Will not experience complications related to urinary retention Outcome: Progressing   Problem: Pain Managment: Goal: General experience of comfort will improve Outcome: Progressing   Problem: Safety: Goal: Ability to remain free from injury will improve Outcome: Progressing   Problem: Skin Integrity: Goal: Risk for impaired skin integrity will decrease Outcome: Progressing

## 2021-03-08 NOTE — ED Provider Notes (Signed)
[facility] EMERGENCY DEPARTMENT Provider Note   CSN: [id number] Arrival date & time: 03/08/21  0330     History Chief Complaint  Patient presents with   Altered Mental Status    Jerome Mays is a 80 y.o. male.  80 year old male who presents emerged from today for altered mental status.  He is accompanied by his wife and his daughter.  Patient is unable to provide history secondary to his mental status.  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  On review of the records it appears he supposed to wear CPAP at night with the mask did not fit well and he did not tolerate it well.  He has been pretty stable from the ALS standpoint but in 2 to 3 days ago he started to have progressively worsening weakness and tiredness.  This continued tonight and EMS was called for further evaluation.    Altered Mental Status     Past Medical History:  Diagnosis Date   ALS (amyotrophic lateral sclerosis) ([place]) 2021   Anemia    low iron   Arthritis    Atypical chest pain 01/04/2020   Colonic polyp    Constipation    COPD (chronic obstructive pulmonary disease) (HCC)    Coronary artery calcification seen on CAT scan 01/04/2020   GERD (gastroesophageal reflux disease)    Gout    History of kidney stones    Kidney failure    Malignant tumor of kidney ([place])    Muscle atrophy    Pain    LOWER BACK AND LEFT HIP - HX OF PREVIOUS LUMBAR AND CERVICAL SURGERY--PT STATES HE HAS HAD NUMBNESS IN BOTH FEET SINCE HIS BACK SURGERY   Pure hypercholesterolemia 01/04/2020   Renal calculus    Retinal detachment    Shortness of breath 01/04/2020    Patient Active Problem List   Diagnosis Date Noted   Acute cystitis with hematuria 07/03/2020   UTI (urinary tract infection) 06/23/2020   Chronic respiratory failure with hypoxia ([place]) 06/12/2020   AKI (acute kidney injury) ([place]) 05/30/2020   Pulmonary emboli ([place]) 05/29/2020   Hydroureteronephrosis 05/29/2020   Mild protein-calorie  malnutrition ([place]) 05/29/2020   GERD (gastroesophageal reflux disease)    ALS (amyotrophic lateral sclerosis) ([place]) 02/13/2020   Atypical chest pain 01/04/2020   Coronary artery calcification seen on CAT scan 01/04/2020   Pure hypercholesterolemia 01/04/2020   Shortness of breath 01/04/2020   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   Weakness 05/15/2019   Muscular atrophy 05/15/2019   Fasciculation 05/15/2019   Muscle weakness (generalized) 05/15/2019   H/O parotidectomy 06/13/2018   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   COPD, group B, by GOLD 2017 classification ([place]) 02/02/2017   Hemoptysis 06/18/2016   Benign prostatic hyperplasia with lower urinary tract symptoms 02/07/2016   Stiffness of left knee 11/08/2012   Difficulty in walking(719.7) 11/08/2012   Gout 03/04/2012   Renal cell cancer ([place]) 01/27/2012   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   ENLARGEMENT OF LYMPH NODES 08/21/2009   CLOS FRACTURE MID/PROXIMAL PHALANX/PHALANG HAND 07/31/2009   NEOPLASM, MALIGNANT, 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   COLONIC POLYPS, HX OF 12/05/2007   RENAL CALCULUS, HX OF 12/05/2007    Past Surgical History:  Procedure Laterality Date   BACK SURGERY     LUMBAR SURGERY   CATARACT EXTRACTION Bilateral 2003   Surgeon unknown   CYSTOSCOPY W/ URETERAL STENT PLACEMENT Left 05/30/2020   Procedure: CYSTOSCOPY URETERAL STENT PLACEMENT;  Surgeon: Remi Haggard, MD;  Location: [facility];  Service: Urology;  Laterality: Left;   CYSTOSCOPY/URETEROSCOPY/HOLMIUM LASER/STENT PLACEMENT Left 06/18/2020   Procedure: CYSTOSCOPY/URETEROSCOPY/HOLMIUM LASER/STENT EXCHANGE;  Surgeon: Remi Haggard, MD;  Location: [facility];  Service: Urology;  Laterality: Left;  1 HR   EYE SURGERY     BILATERAL CATARACT EXTRACTIONS   GIVENS CAPSULE STUDY N/A 10/07/2015   Procedure: GIVENS CAPSULE STUDY;  Surgeon: Carol Ada, MD;  Location: [facility] ENDOSCOPY;  Service: Endoscopy;  Laterality: N/A;   HAND  SURGERY     left   KNEE SURGERY     right   LAMINECTOMY     cervical   LASER PHOTO ABLATION Left 08/19/2017   Procedure: LASER PHOTO ABLATION;  Surgeon: [name], MD;  Location: [place];  Service: Ophthalmology;  Laterality: Left;   MEMBRANE PEEL Left 03/22/2017   Procedure: POSSIBLE MEMBRANE PEEL WITH SILICONE OIL AND PERFLURON;  Surgeon: [name], MD;  Location: [place];  Service: Ophthalmology;  Laterality: Left;   NEPHRECTOMY     PT STATES RIGHT KIDNEY WAS REMOVED   PAROTIDECTOMY Right 06/13/2018   Procedure: RIGHT TOTAL PAROTIDECTOMY;  Surgeon: Leta Baptist, MD;  Location: [place];  Service: ENT;  Laterality: Right;   PARS PLANA VITRECTOMY Left 03/22/2017   Procedure: PARS PLANA VITRECTOMY WITH 25 GAUGE;  Surgeon: [name], MD;  Location: [place];  Service: Ophthalmology;  Laterality: Left;   PARS PLANA VITRECTOMY W/ SCLERAL BUCKLE Left 08/19/2017   Procedure: 41 GAUGE PARS PLANA VITRECTOMY WITH SILCONE OIL REMOVAL;  Surgeon: [name], MD;  Location: [place];  Service: Ophthalmology;  Laterality: Left;   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   SALIVARY STONE REMOVAL Right 02/21/2019   Procedure: RIGHT PAROTID FISTULA REPAIR;  Surgeon: Leta Baptist, MD;  Location: [place];  Service: ENT;  Laterality: Right;   SHOULDER SURGERY  2020   SCA   TOTAL KNEE ARTHROPLASTY Left 10/19/2012   Procedure: LEFT TOTAL KNEE ARTHROPLASTY;  Surgeon: Magnus Sinning, MD;  Location: [facility];  Service: Orthopedics;  Laterality: Left;   VITRECTOMY 25 GAUGE WITH SCLERAL BUCKLE Left 02/17/2017   Procedure: VITRECTOMY 25 GAUGE WITH SCLERAL BUCKLE membrane peel, injection of silicone oil, and endolaser photocoagulation.;  Surgeon: [name], MD;  Location: [place];  Service: Ophthalmology;  Laterality: Left;       Family History  Problem Relation Age of Onset   Diabetes Mother    COPD Brother    COPD Sister    Heart attack Father    Amblyopia Neg Hx    Blindness Neg Hx     Cataracts Neg Hx    Glaucoma Neg Hx    Macular degeneration Neg Hx    Retinal detachment Neg Hx    Retinitis pigmentosa Neg Hx     Social History   Tobacco Use   Smoking status: Former    Packs/day: 2.00    Years: 40.00    Pack years: 80.00    Types: Cigarettes, Pipe    Quit date: 06/09/1995    Years since quitting: 25.7   Smokeless tobacco: Never  Vaping Use   Vaping Use: Never used  Substance Use Topics   Alcohol use: No   Drug use: No    Home Medications Prior to Admission medications   Medication Sig Start Date End Date Taking? Authorizing Provider  allopurinol (ZYLOPRIM) 300 MG tablet Take 300 mg by mouth daily.    [provider]  b  complex vitamins tablet Take 1 tablet by mouth daily.    [provider]  Cholecalciferol (VITAMIN D3) 50 MCG (2000 UT) TABS Take by mouth.    [provider]  clobetasol (TEMOVATE) 0.05 % external solution Apply 1 application topically daily as needed. 06/11/20   [provider]  ELIQUIS 5 MG TABS tablet Take 1 tablet by mouth twice daily 11/28/20   Mannam, Hart Robinsons, MD  Ferric Maltol (ACCRUFER) 30 MG CAPS  07/12/20   [provider]  Fluticasone-Umeclidin-Vilant (TRELEGY ELLIPTA) 200-62.5-25 MCG/INH AEPB INHALE 1 PUFF ONCE DAILY 02/03/21   Mannam, Praveen, MD  gabapentin (NEURONTIN) 300 MG capsule Take 300 mg by mouth 2 (two) times daily. 07/12/20 07/12/21  [provider]  guaiFENesin (MUCINEX) 600 MG 12 hr tablet Take by mouth.    [provider]  hydrOXYzine (ATARAX/VISTARIL) 10 MG tablet See admin instructions.    [provider]  ipratropium (ATROVENT) 0.03 % nasal spray Place into the nose. 02/25/21 04/26/21  [provider]  ketoconazole (NIZORAL) 2 % shampoo Apply 1 application topically every other day. 06/12/20   [provider]  ketorolac (ACULAR) 0.5 % ophthalmic solution Place 1 drop into the left eye 4 (four) times daily. 08/27/20 08/27/21  [name], MD  mirtazapine (REMERON) 7.5 MG tablet Take 7.5 mg by mouth at bedtime. 08/06/20   [provider]  Multiple Vitamin (MULTIVITAMIN WITH MINERALS) TABS tablet Take 1 tablet by mouth daily.    [provider]  NON FORMULARY     [provider]  nystatin-triamcinolone (MYCOLOG II) cream SMARTSIG:Sparingly Topical 2-3 Times Daily PRN 08/07/20   [provider]  pantoprazole (PROTONIX) 40 MG tablet 1 tablet    [provider]  polyethylene glycol (MIRALAX / GLYCOLAX) 17 g packet Take 17 g by mouth daily.    [provider]  pramoxine (CERAVE ITCH RELIEF) 1 % LOTN Apply 1 application topically 2 (two) times daily as needed (itching/dry skin.).    [provider]  prednisoLONE acetate (PRED FORTE) 1 % ophthalmic suspension Place 1 drop into the left eye 4 (four) times daily. 08/27/20   [name], MD  riluzole (RILUTEK) 50 MG tablet Take 1 tablet (50 mg total) by mouth every 12 (twelve) hours. 07/06/19   Marcial Pacas, MD  sodium chloride (MURO 128) 5 % ophthalmic solution Place 1 drop into the left eye in the morning and at bedtime. 06/26/20   Johnson, Clanford L, MD  triamcinolone (KENALOG) 0.1 % Apply 1 application topically 2 (two) times daily as needed (skin irritation.). Patient not taking: Reported on 03/03/2021 06/12/20   [provider]    Allergies    Lipitor [atorvastatin]  Review of Systems   Review of Systems  Unable to perform ROS: Mental status change   Physical Exam Updated Vital Signs BP (!) 125/55   Pulse 70   Temp 98.3 F (36.8 C) (Axillary)   Resp 17   SpO2 100%   Physical Exam Vitals and nursing note reviewed.  Constitutional:      Appearance: He is well-developed.  HENT:     Head: Normocephalic and atraumatic.     Mouth/Throat:     Mouth: Mucous membranes are moist.     Pharynx: Oropharynx is clear.  Eyes:     Comments: Left pupil is approximately 1 mm larger than the right.  Both are  reactive.  Family states that this is chronic.  Cardiovascular:     Rate and Rhythm: Normal  rate.  Pulmonary:     Effort: Pulmonary effort is normal. No respiratory distress.  Abdominal:     General: There is no distension.  Musculoskeletal:        General: No swelling or tenderness. Normal range of motion.     Cervical back: Normal range of motion.  Skin:    General: Skin is warm and dry.  Neurological:     Mental Status: He is alert. He is disoriented.    ED Results / Procedures / Treatments   Labs (all labs ordered are listed, but only abnormal results are displayed) Labs Reviewed  COMPREHENSIVE METABOLIC PANEL - Abnormal; Notable for the following components:      Result Value   CO2 37 (*)    Glucose, Bld 230 (*)    All other components within normal limits  CBC WITH DIFFERENTIAL/PLATELET - Abnormal; Notable for the following components:   RBC 3.56 (*)    Hemoglobin 11.0 (*)    HCT 38.8 (*)    MCV 109.0 (*)    MCHC 28.4 (*)    RDW 17.6 (*)    Lymphs Abs 0.4 (*)    All other components within normal limits  BLOOD GAS, VENOUS - Abnormal; Notable for the following components:   pH, Ven 7.131 (*)    pCO2, Ven 119 (*)    pO2, Ven 181.0 (*)    Bicarbonate 29.7 (*)    Acid-Base Excess 8.8 (*)    All other components within normal limits  CBG MONITORING, ED - Abnormal; Notable for the following components:   Glucose-Capillary 204 (*)    All other components within normal limits  URINE CULTURE  LACTIC ACID, PLASMA  ETHANOL  PROTIME-INR  URINALYSIS, COMPLETE (UACMP) WITH MICROSCOPIC  RAPID URINE DRUG SCREEN, HOSP PERFORMED    EKG None  Radiology CT HEAD WO CONTRAST  Result Date: 03/08/2021 CLINICAL DATA:  Mental status change with unknown cause. EXAM: CT HEAD WITHOUT CONTRAST TECHNIQUE: Contiguous axial images were obtained from the base of the skull through the vertex without intravenous contrast. COMPARISON:  Brain MRI 06/17/2019 FINDINGS: Brain: No evidence of  acute infarction, hemorrhage, hydrocephalus, extra-axial collection or mass lesion/mass effect. Mild periventricular low-density. Preserved brain volume for age Vascular: No hyperdense vessel or unexpected calcification. Skull: Normal. Negative for fracture or focal lesion. Sinuses/Orbits: No acute finding. Bilateral cataract resection and left scleral banding IMPRESSION: Unremarkable study for age. Electronically Signed   By: Jorje Guild M.D.   On: 03/08/2021 04:44   DG Chest Portable 1 View  Result Date: 03/08/2021 CLINICAL DATA:  Evaluate for infection. Acute mental status change. Smoker. EXAM: PORTABLE CHEST 1 VIEW COMPARISON:  CT scan November 21, 2020.  Chest x-ray May 29, 2020. FINDINGS: The cardiomediastinal silhouette is stable. A small left effusion with underlying opacity is more pronounced in the interval. No pneumothorax. No nodular mass. Coarsened lung markings are seen bilaterally. Patchy opacity seen in the right mid lung. IMPRESSION: 1. Patchy opacity in the right mid lung may represent developing pneumonia in the appropriate clinical situation. Recommend clinical correlation and attention on follow-up. 2. Diffuse coarsened lung markings have increased since December 2021 suggesting bronchitic change or atypical infection. 3. Small left effusion with underlying opacity, likely atelectasis, slightly larger in the interval. Electronically Signed   By: Dorise Bullion III M.D.   On: 03/08/2021 05:07    Procedures .Critical Care Performed by: Merrily Pew, MD Authorized by: Merrily Pew, MD   Critical care provider statement:  Critical care time (minutes):  45   Critical care was necessary to treat or prevent imminent or life-threatening deterioration of the following conditions:  Respiratory failure   Critical care was time spent personally by me on the following activities:  Discussions with consultants, evaluation of patient's response to treatment, examination of patient,  ordering and performing treatments and interventions, ordering and review of laboratory studies, ordering and review of radiographic studies, pulse oximetry, re-evaluation of patient's condition, obtaining history from patient or surrogate and review of old charts   Medications Ordered in ED Medications  lactated ringers bolus 1,000 mL (1,000 mLs Intravenous New Bag/Given 03/08/21 0537)    ED Course  I have reviewed the triage vital signs and the nursing notes.  Pertinent labs & imaging results that were available during my care of the patient were reviewed by me and considered in my medical decision making (see chart for details).    MDM Rules/Calculators/A&P                         Patient found to have pretty high CO2 on his VBG.  This probably related to his ALS.  He could have possible chest infection based on the x-ray however also still pending urine.  Not sure this is infection as he does have a fever nor has he had a productive cough recently.  We will start on BiPAP.  She does not awaken up to participate with a negative inspiratory force at this time.  We will start BiPAP and discussed with hospitalist for admission and repeat blood gases and further decision-making per them.  Final Clinical Impression(s) / ED Diagnoses Final diagnoses:  Altered mental status, unspecified altered mental status type  Acute on chronic respiratory failure with hypercapnia [facility])    Rx / DC Orders ED Discharge Orders     None        Donette Mainwaring, Corene Cornea, MD 03/08/21 [phone]

## 2021-03-08 NOTE — ED Notes (Signed)
MD made aware of pts BP. 73/46

## 2021-03-08 NOTE — ED Triage Notes (Signed)
Pt arrived via [facility] with altered mental status. Pt is able to shake head yes or no that is extent of ability to respond. Per EMS last known well 0230  today

## 2021-03-08 NOTE — ED Notes (Signed)
Pt family concerned pt has not urinated. Bladder scanned pt. 380ML in the bladder. MD made aware who has no concerns at this time. Family made aware.

## 2021-03-08 NOTE — Progress Notes (Signed)
Patient does not want to go on BIPAP at this time.  Patient is comfortable on 3L nasal cannula.  O2 sats 96%  RT will continue to monitor.

## 2021-03-08 NOTE — ED Notes (Signed)
Pt. Family member requested a temperature check. This RN checked temp rectally 99.9 F. Pt diaphoretic at the time. Removed bear hugger from pt. Will continue to monitor.

## 2021-03-09 ENCOUNTER — Other Ambulatory Visit: Payer: Self-pay

## 2021-03-09 DIAGNOSIS — G1220 Motor neuron disease, unspecified: Secondary | ICD-10-CM

## 2021-03-09 DIAGNOSIS — Z9911 Dependence on respirator [ventilator] status: Secondary | ICD-10-CM

## 2021-03-09 DIAGNOSIS — R0602 Shortness of breath: Secondary | ICD-10-CM

## 2021-03-09 DIAGNOSIS — R531 Weakness: Secondary | ICD-10-CM

## 2021-03-09 DIAGNOSIS — J449 Chronic obstructive pulmonary disease, unspecified: Secondary | ICD-10-CM

## 2021-03-09 MED ORDER — GABAPENTIN 300 MG PO CAPS
300.0000 mg | ORAL_CAPSULE | Freq: Three times a day (TID) | ORAL | Status: DC
  Administered 2021-03-09 – 2021-03-10 (×4): 300 mg via ORAL
  Filled 2021-03-09 (×6): qty 1

## 2021-03-09 MED ORDER — SODIUM CHLORIDE 0.9 % IV SOLN
INTRAVENOUS | Status: DC | PRN
  Administered 2021-03-09: 250 mL via INTRAVENOUS

## 2021-03-09 MED ORDER — RILUZOLE 50 MG PO TABS
50.0000 mg | ORAL_TABLET | Freq: Two times a day (BID) | ORAL | Status: DC
  Administered 2021-03-10: 50 mg via ORAL

## 2021-03-09 NOTE — Progress Notes (Signed)
PROGRESS NOTE   Jerome Mays  UKG:[id number] DOB: [birth year] DOA: 03/08/2021 PCP: [name], MD   Chief Complaint  Patient presents with   Altered Mental Status   Level of care: Stepdown  Brief Admission History:  80 y.o. 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   He saw his pulmonologist about 5 days ago and there is nothing further treatment wise that they could offer and recommended hospice care and made him DNR with signed .  In the last couple of days he has become increasingly somnolent, still not using his home ventilator.  He became increasingly lethargic and stuporous and EMS was called and he was taken to the ED.   Pt arrived severely obtunded. ABG with findings of pH 7.131, pCO2 119, he had temp of 98.3, then repeated at 94.2 rectal, placed on warming blanket, BP 73/46, RR 22, pulse ox 100%.  Glucose 204, sodium 144, potassium 3.9, CO2 37, creatinine 0.65.  WBC 8.3, hemoglobin 11.0, platelet count 166.  Influenza test negative.  SARS 2 coronavirus test negative.  Lactic acid 0.6.  CT head with no acute findings.  Chest x-ray with patchy opacity in the right midlung.  Assessment & Plan:   Principal Problem:   Acute and chronic respiratory failure with hypercapnia (HCC) Active Problems:   COPD with chronic bronchitis (HCC)   Weakness   Muscular atrophy   Fasciculation   Muscle weakness (generalized)   Motor neuron disease (HCC)   Coronary artery calcification seen on CAT scan   Shortness of breath   ALS (amyotrophic lateral sclerosis) (HCC)   GERD (gastroesophageal reflux disease)   Benign prostatic hyperplasia with lower urinary tract symptoms   Chronic respiratory failure with hypoxia (HCC)   DNR (do not resuscitate)   Hypothermia   Hypotensive episode   Noncompliance   Ventilator dependent ([place])  Acute on chronic  respiratory failure with hypercapnia-secondary to end-stage ALS and patient refusal to wear home ventilator.  I explained to the patient's family that without using a home ventilator he is not going to survive.  He appears to be terminal at this time.  He is appropriately DNR given no chance of any meaningful recovery from end stage ALS and refusal to wear the ventilator.  He is actively dying.  I had a conversation with wife and daughter and they would like for him to go to residential hospice as they do not feel like they can care for him at home in this condition.  We will keep him in the hospital for now until we can make arrangements for him to go to residential hospice.  We will try to keep him comfortable.  I explained to family that if you are becomes more alert as his PCO2 comes down on BiPAP he may refuse to wear BiPAP again and we would not force him to wear it.  They verbalized understanding.  Palliative care consult for further comforting measures.    Probable aspiration pneumonia-continue IV antibiotics for now per family request and follow blood cultures.  I explained to family that this would not change the overall trajectory of his dying.  They verbalized understanding.  DC when transferring to residential hospice.    Hypotension and hypothermia-patient is actively dying and we will focus on keeping him comfortable.  He is DNR and we will respect his wishes.  The plan is to get him to residential hospice per family request.   Ventilator dependence-patient continues to refuse to use his home ventilator.  Unfortunately this has led to his current condition with a markedly elevated PCO2 and respiratory acidosis.  I explained to the patient's family that we cannot force him to wear a BiPAP if he is not willing.  Pt has not wanted the bipap to be replaced.  Unfortunately his pCO2 will likely continue to climb and he will soon be back in same condition with hypercapnic respiratory failure.      ALS-patient unfortunately is at end stages of his disease process and recently saw pulmonologist and there was nothing further that could be added to his treatment plan.  He is actively dying.  Working on residential hospice placement.   DVT prophylaxis: hospice comfort care   Code Status: DNR   Family Communication: wife updated at bedside, discussed at length plan of care   Disposition Plan: residential hospice placement when bed available   Consults called:  palliative care Admission status: INP Status is: Inpatient  Remains inpatient appropriate because:Inpatient level of care appropriate due to severity of illness  Dispo: The patient is from: Home              Anticipated d/c is to:  residential hospice               Patient currently is medically stable to d/c.   Difficult to place patient No  Consultants:  Palliative   Procedures:  bipap  Antimicrobials:  Doxy 10/1>   Subjective: Pt doesn't want to go back on the bipap.  He is taking bites of food.  Wife at bedside.  Pt c/o parethesias in the feet.   Objective: Vitals:   03/09/21 0425 03/09/21 0500 03/09/21 0600 03/09/21 0748  BP: 121/72     Pulse: (!) 58 71 67   Resp: 14 (!) 9 (!) 21   Temp:    98 F (36.7 C)  TempSrc:    Axillary  SpO2: 97% 94% 97%   Weight:      Height:        Intake/Output Summary (Last 24 hours) at 03/09/2021 0958 Last data filed at 03/09/2021 0400 Gross per 24 hour  Intake 515.45 ml  Output 250 ml  Net 265.45 ml   Filed Weights   03/08/21 1619  Weight: 71 kg    Examination:  General exam: frail elderly male, Appears calm and comfortable.   Respiratory system: very shallow weak breathing.  Cardiovascular system: normal S1 & S2 heard. No JVD, murmurs, rubs, gallops or clicks. No pedal edema. Gastrointestinal system: Abdomen is nondistended, soft and nontender. No organomegaly or masses felt. Normal bowel sounds heard. Central nervous system: Alert and oriented.  Extremities:  trace pretibial edema.  Skin: No rashes, lesions or ulcers Psychiatry: Judgement and insight unable to determine. Mood & affect appropriate.   Data Reviewed: I have personally reviewed following labs and imaging studies  CBC: Recent Labs  Lab 03/08/21 0454  WBC 8.3  NEUTROABS 7.5  HGB 11.0*  HCT 38.8*  MCV 109.0*  PLT 102    Basic Metabolic Panel: Recent Labs  Lab 03/08/21 0454  NA 144  K 3.9  CL 102  CO2 37*  GLUCOSE 230*  BUN 16  CREATININE 0.65  CALCIUM 9.6    GFR: Estimated Creatinine Clearance: 72.7 mL/min (by C-G formula based on SCr of 0.65 mg/dL).  Liver Function Tests: Recent Labs  Lab  03/08/21 0454  AST 21  ALT 18  ALKPHOS 73  BILITOT 0.6  PROT 7.0  ALBUMIN 3.6    CBG: Recent Labs  Lab 03/08/21 0515  GLUCAP 204*    Recent Results (from the past 240 hour(s))  Resp Panel by RT-PCR (Flu A&B, Covid) Nasopharyngeal Swab     Status: None   Collection Time: 03/08/21  7:31 AM   Specimen: Nasopharyngeal Swab; Nasopharyngeal(NP) swabs in vial transport medium  Result Value Ref Range Status   SARS Coronavirus 2 by RT PCR NEGATIVE NEGATIVE Final    Comment: (NOTE) SARS-CoV-2 target nucleic acids are NOT DETECTED.  The SARS-CoV-2 RNA is generally detectable in upper respiratory specimens during the acute phase of infection. The lowest concentration of SARS-CoV-2 viral copies this assay can detect is 138 copies/mL. A negative result does not preclude SARS-Cov-2 infection and should not be used as the sole basis for treatment or other patient management decisions. A negative result may occur with  improper specimen collection/handling, submission of specimen other than nasopharyngeal swab, presence of viral mutation(s) within the areas targeted by this assay, and inadequate number of viral copies(<138 copies/mL). A negative result must be combined with clinical observations, patient history, and epidemiological information. The expected result is  Negative.  Fact Sheet for Patients:  EntrepreneurPulse.com.au  Fact Sheet for Healthcare Providers:  IncredibleEmployment.be  This test is no t yet approved or cleared by the [place] FDA and  has been authorized for detection and/or diagnosis of SARS-CoV-2 by FDA under an Emergency Use Authorization (EUA). This EUA will remain  in effect (meaning this test can be used) for the duration of the COVID-19 declaration under Section 564(b)(1) of the Act, 21 U.S.C.section 360bbb-3(b)(1), unless the authorization is terminated  or revoked sooner.       Influenza A by PCR NEGATIVE NEGATIVE Final   Influenza B by PCR NEGATIVE NEGATIVE Final    Comment: (NOTE) The Xpert Xpress SARS-CoV-2/FLU/RSV plus assay is intended as an aid in the diagnosis of influenza from Nasopharyngeal swab specimens and should not be used as a sole basis for treatment. Nasal washings and aspirates are unacceptable for Xpert Xpress SARS-CoV-2/FLU/RSV testing.  Fact Sheet for Patients: EntrepreneurPulse.com.au  Fact Sheet for Healthcare Providers: IncredibleEmployment.be  This test is not yet approved or cleared by the [place] FDA and has been authorized for detection and/or diagnosis of SARS-CoV-2 by FDA under an Emergency Use Authorization (EUA). This EUA will remain in effect (meaning this test can be used) for the duration of the COVID-19 declaration under Section 564(b)(1) of the Act, 21 U.S.C. section 360bbb-3(b)(1), unless the authorization is terminated or revoked.  Performed at [facility], [address] [place], [place] [address]   Culture, blood (routine x 2)     Status: None (Preliminary result)   Collection Time: 03/08/21  7:58 AM   Specimen: BLOOD RIGHT HAND  Result Value Ref Range Status   Specimen Description   Final    BLOOD RIGHT HAND BOTTLES DRAWN AEROBIC AND ANAEROBIC   Special Requests Blood Culture  adequate volume  Final   Culture   Final    NO GROWTH < 24 HOURS Performed at [facility], [address] [place], [place] [address]    Report Status PENDING  Incomplete  Culture, blood (routine x 2)     Status: None (Preliminary result)   Collection Time: 03/08/21  7:58 AM   Specimen: BLOOD LEFT HAND  Result Value Ref Range Status   Specimen  Description   Final    BLOOD LEFT HAND BOTTLES DRAWN AEROBIC AND ANAEROBIC   Special Requests Blood Culture adequate volume  Final   Culture   Final    NO GROWTH < 24 HOURS Performed at [facility], [address], [place], [place] [address]    Report Status PENDING  Incomplete     Radiology Studies: CT HEAD WO CONTRAST  Result Date: 03/08/2021 CLINICAL DATA:  Mental status change with unknown cause. EXAM: CT HEAD WITHOUT CONTRAST TECHNIQUE: Contiguous axial images were obtained from the base of the skull through the vertex without intravenous contrast. COMPARISON:  Brain MRI 06/17/2019 FINDINGS: Brain: No evidence of acute infarction, hemorrhage, hydrocephalus, extra-axial collection or mass lesion/mass effect. Mild periventricular low-density. Preserved brain volume for age Vascular: No hyperdense vessel or unexpected calcification. Skull: Normal. Negative for fracture or focal lesion. Sinuses/Orbits: No acute finding. Bilateral cataract resection and left scleral banding IMPRESSION: Unremarkable study for age. Electronically Signed   By: Jorje Guild M.D.   On: 03/08/2021 04:44   DG Chest Portable 1 View  Result Date: 03/08/2021 CLINICAL DATA:  Evaluate for infection. Acute mental status change. Smoker. EXAM: PORTABLE CHEST 1 VIEW COMPARISON:  CT scan November 21, 2020.  Chest x-ray May 29, 2020. FINDINGS: The cardiomediastinal silhouette is stable. A small left effusion with underlying opacity is more pronounced in the interval. No pneumothorax. No nodular mass. Coarsened lung markings are seen bilaterally. Patchy opacity seen in the right  mid lung. IMPRESSION: 1. Patchy opacity in the right mid lung may represent developing pneumonia in the appropriate clinical situation. Recommend clinical correlation and attention on follow-up. 2. Diffuse coarsened lung markings have increased since December 2021 suggesting bronchitic change or atypical infection. 3. Small left effusion with underlying opacity, likely atelectasis, slightly larger in the interval. Electronically Signed   By: Dorise Bullion III M.D.   On: 03/08/2021 05:07    Scheduled Meds:  chlorhexidine  15 mL Mouth Rinse BID   gabapentin  300 mg Oral TID   mouth rinse  15 mL Mouth Rinse q12n4p   Continuous Infusions:  sodium chloride 10 mL/hr at 03/09/21 0400   doxycycline (VIBRAMYCIN) IV 100 mg (03/09/21 0907)     LOS: 1 day   Time spent: 35 mins   Rhyse Skowron Wynetta Emery, MD How to contact the [facility] Attending or Consulting provider [place] or covering provider during after hours [place], for this patient?  Check the care team in [facility] and look for a) attending/consulting TRH provider listed and b) the [facility] team listed Log into [email or web address] and use [place]'s universal password to access. If you do not have the password, please contact the hospital operator. Locate the [facility] provider you are looking for under [facility] and page to a number that you can be directly reached. If you still have difficulty reaching the provider, please page the [facility] (Director on Call) for the Hospitalists listed on amion for assistance.  03/09/2021, 9:58 AM

## 2021-03-09 NOTE — TOC Progression Note (Signed)
Transition of Care [facility]) - Progression Note    Patient Details  Name: Jerome Mays MRN: [id number] Date of Birth: [birth year]  Transition of Care [facility]) CM/SW [place], [place] Phone Number: 03/09/2021, 1:20 PM  Clinical Narrative:    CSW spoke with pts wife who states they are only interested in pt going to the [facility] for hospice care. They are not agreeable to any other residential hospice facility. CSW spoke to [place] with [facility] who states that pt cannot be on [facility]Pap [facility]. CSW to update MD and RN of this.   Expected Discharge Plan: Hospice Medical Facility Barriers to Discharge: Other (must enter comment) (No Hospice Beds)  Expected Discharge Plan and Services Expected Discharge Plan: [place]                                               Social Determinants of Health (SDOH) Interventions    Readmission Risk Interventions Readmission Risk Prevention Plan 06/26/2020  Post Dischage Appt Complete  Medication Screening Complete  Transportation Screening Complete  Some recent data might be hidden

## 2021-03-10 LAB — SARS CORONAVIRUS 2 (TAT 6-24 HRS): SARS Coronavirus 2: NEGATIVE

## 2021-03-10 NOTE — Discharge Summary (Signed)
Physician Discharge Summary  Jerome Mays JEH:[id number] DOB: [birth year] DOA: 03/08/2021  PCP: [name], MD  Admit date: 03/08/2021 Discharge date: 03/10/2021  Admitted From: Home  Disposition: Residential Hospice   Recommendations for Outpatient Follow-up:  SYMPTOM MANAGEMENT PER HOSPICE PROTOCOL   Discharge Condition: HOSPICE   CODE STATUS: DNR   Brief Hospitalization Summary: Please see all hospital notes, images, labs for full details of the hospitalization. ADMISSION HPI: Jerome Mays is a 80 y.o. 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   He saw his pulmonologist about 5 days ago and there is nothing further treatment wise that they could offer and recommended hospice care and made him DNR with signed .  In the last couple of days he has become increasingly somnolent, still not using his home ventilator.  He became increasingly lethargic and stuporous and EMS was called and he was taken to the ED.  ED Course: Pt arrived severely obtunded. ABG with findings of pH 7.131, pCO2 119, he had temp of 98.3, then repeated at 94.2 rectal, placed on warming blanket, BP 73/46, RR 22, pulse ox 100%.  Glucose 204, sodium 144, potassium 3.9, CO2 37, creatinine 0.65.  WBC 8.3, hemoglobin 11.0, platelet count 166.  Influenza test negative.  SARS 2 coronavirus test negative.  Lactic acid 0.6.  CT head with no acute findings.  Chest x-ray with patchy opacity in the right midlung.  HOSPITAL COURSE  Acute on chronic respiratory failure with hypercapnia-secondary to end-stage ALS and patient refusal to wear home ventilator.  I explained to the patient's family that without using a home ventilator he is not going to survive.  He appears to be terminal at this time.  He is appropriately DNR given no chance of any meaningful recovery from end stage ALS and refusal  to wear the ventilator.  He is actively dying.  I had a conversation with wife and daughter and they would like for him to go to residential hospice as they do not feel like they can care for him at home in this condition.  We will keep him in the hospital for now until we can make arrangements for him to go to residential hospice.  We will try to keep him comfortable.  I explained to family that if you are becomes more alert as his PCO2 comes down on BiPAP he may refuse to wear BiPAP again and we would not force him to wear it.  They verbalized understanding.  Palliative care consult for further comforting measures.    Probable aspiration pneumonia - FULL COMFORT MEASURES    Hypotension and hypothermia-patient is actively dying and we will focus on keeping him comfortable.  He is DNR and we will respect his wishes.  The plan is to get him to residential hospice per family request.   Ventilator dependence-patient continues to refuse to use his home ventilator.  Unfortunately this has led to his current condition with a markedly elevated PCO2 and respiratory acidosis.  I explained to the patient's family that we cannot force him to wear a BiPAP if he is not willing.  Pt has not wanted the bipap to be replaced.  Unfortunately his pCO2 will likely continue to climb and he will soon be back in same condition with hypercapnic respiratory failure.  PLAN TO GO TO RESIDENTIAL HOSPICE    ALS-patient unfortunately  is at end stages of his disease process and recently saw pulmonologist and there was nothing further that could be added to his treatment plan.  He is actively dying.  Working on residential hospice placement.    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[place])   Coronary artery calcification seen on CAT scan   Shortness of breath   ALS (amyotrophic lateral sclerosis) (HCC)   GERD (gastroesophageal reflux disease)   Benign prostatic hyperplasia with lower urinary tract symptoms   Chronic respiratory failure with hypoxia ([employer])   DNR (do not resuscitate)   Hypothermia   Hypotensive episode   Noncompliance   Ventilator dependent [facility])  Discharge Instructions:  Allergies as of 03/10/2021       Reactions   Lipitor [atorvastatin] Rash, Other (See Comments)   Passed out        Medication List     STOP taking these medications    CeraVe Itch Relief 1 % Lotn Generic drug: pramoxine   clobetasol 0.05 % external solution Commonly known as: TEMOVATE   Eliquis 5 MG Tabs tablet Generic drug: apixaban   guaiFENesin 600 MG 12 hr tablet Commonly known as: MUCINEX   hydrOXYzine 10 MG tablet Commonly known as: ATARAX/VISTARIL   ipratropium 0.03 % nasal spray Commonly known as: ATROVENT   ketoconazole 2 % shampoo Commonly known as: NIZORAL   multivitamin with minerals Tabs tablet   nystatin-triamcinolone cream Commonly known as: MYCOLOG II   pantoprazole 40 MG tablet Commonly known as: PROTONIX   sodium chloride 5 % ophthalmic solution Commonly known as: MURO 128   Trelegy Ellipta 200-62.5-25 MCG/INH Aepb Generic drug: Fluticasone-Umeclidin-Vilant   triamcinolone cream 0.1 % Commonly known as: KENALOG       TAKE these medications    allopurinol 300 MG tablet Commonly known as: ZYLOPRIM Take 300 mg by mouth daily.   cetirizine 10 MG tablet Commonly known as: ZYRTEC Take 10 mg by mouth daily.   fluticasone-salmeterol 250-50 MCG/ACT Aepb Commonly known as:  ADVAIR Inhale 1 puff into the lungs daily.   gabapentin 300 MG capsule Commonly known as: NEURONTIN Take 300 mg by mouth 2 (two) times daily.   ketorolac 0.5 % ophthalmic solution Commonly known as: ACULAR Place 1 drop into the left eye 4 (four) times daily.   mirtazapine 7.5 MG tablet Commonly known as: REMERON Take 7.5 mg by mouth at bedtime.   prednisoLONE acetate 1 % ophthalmic suspension Commonly known as: PRED FORTE Place 1 drop into the left eye 4 (four) times daily.   riluzole 50 MG tablet Commonly known as: Rilutek Take 1 tablet (50 mg total) by mouth every 12 (twelve) hours.        Allergies  Allergen Reactions   Lipitor [Atorvastatin] Rash and Other (See Comments)    Passed out   Allergies as of 03/10/2021       Reactions   Lipitor [atorvastatin] Rash, Other (See Comments)   Passed out        Medication List  STOP taking these medications    CeraVe Itch Relief 1 % Lotn Generic drug: pramoxine   clobetasol 0.05 % external solution Commonly known as: TEMOVATE   Eliquis 5 MG Tabs tablet Generic drug: apixaban   guaiFENesin 600 MG 12 hr tablet Commonly known as: MUCINEX   hydrOXYzine 10 MG tablet Commonly known as: ATARAX/VISTARIL   ipratropium 0.03 % nasal spray Commonly known as: ATROVENT   ketoconazole 2 % shampoo Commonly known as: NIZORAL   multivitamin with minerals Tabs tablet   nystatin-triamcinolone cream Commonly known as: MYCOLOG II   pantoprazole 40 MG tablet Commonly known as: PROTONIX   sodium chloride 5 % ophthalmic solution Commonly known as: MURO 128   Trelegy Ellipta 200-62.5-25 MCG/INH Aepb Generic drug: Fluticasone-Umeclidin-Vilant   triamcinolone cream 0.1 % Commonly known as: KENALOG       TAKE these medications    allopurinol 300 MG tablet Commonly known as: ZYLOPRIM Take 300 mg by mouth daily.   cetirizine 10 MG tablet Commonly known as: ZYRTEC Take 10 mg by mouth daily.    fluticasone-salmeterol 250-50 MCG/ACT Aepb Commonly known as: ADVAIR Inhale 1 puff into the lungs daily.   gabapentin 300 MG capsule Commonly known as: NEURONTIN Take 300 mg by mouth 2 (two) times daily.   ketorolac 0.5 % ophthalmic solution Commonly known as: ACULAR Place 1 drop into the left eye 4 (four) times daily.   mirtazapine 7.5 MG tablet Commonly known as: REMERON Take 7.5 mg by mouth at bedtime.   prednisoLONE acetate 1 % ophthalmic suspension Commonly known as: PRED FORTE Place 1 drop into the left eye 4 (four) times daily.   riluzole 50 MG tablet Commonly known as: Rilutek Take 1 tablet (50 mg total) by mouth every 12 (twelve) hours.        Procedures/Studies: CT HEAD WO CONTRAST  Result Date: 03/08/2021 CLINICAL DATA:  Mental status change with unknown cause. EXAM: CT HEAD WITHOUT CONTRAST TECHNIQUE: Contiguous axial images were obtained from the base of the skull through the vertex without intravenous contrast. COMPARISON:  Brain MRI 06/17/2019 FINDINGS: Brain: No evidence of acute infarction, hemorrhage, hydrocephalus, extra-axial collection or mass lesion/mass effect. Mild periventricular low-density. Preserved brain volume for age Vascular: No hyperdense vessel or unexpected calcification. Skull: Normal. Negative for fracture or focal lesion. Sinuses/Orbits: No acute finding. Bilateral cataract resection and left scleral banding IMPRESSION: Unremarkable study for age. Electronically Signed   By: Jorje Guild M.D.   On: 03/08/2021 04:44   DG Chest Portable 1 View  Result Date: 03/08/2021 CLINICAL DATA:  Evaluate for infection. Acute mental status change. Smoker. EXAM: PORTABLE CHEST 1 VIEW COMPARISON:  CT scan November 21, 2020.  Chest x-ray May 29, 2020. FINDINGS: The cardiomediastinal silhouette is stable. A small left effusion with underlying opacity is more pronounced in the interval. No pneumothorax. No nodular mass. Coarsened lung markings are seen  bilaterally. Patchy opacity seen in the right mid lung. IMPRESSION: 1. Patchy opacity in the right mid lung may represent developing pneumonia in the appropriate clinical situation. Recommend clinical correlation and attention on follow-up. 2. Diffuse coarsened lung markings have increased since December 2021 suggesting bronchitic change or atypical infection. 3. Small left effusion with underlying opacity, likely atelectasis, slightly larger in the interval. Electronically Signed   By: Dorise Bullion III M.D.   On: 03/08/2021 05:07   OCT, Retina - OU - Both Eyes  Result Date: 03/01/2021 Right Eye Quality was good. Central Foveal Thickness: 246. Progression has been stable. Findings  include normal foveal contour, no IRF, no SRF (Partial PVD). Left Eye Quality was good. Central Foveal Thickness: 332. Progression has improved. Findings include no SRF, epiretinal membrane, intraretinal fluid, abnormal foveal contour (Patchy ORA, interval improvement in IRF/CME temporal macula). Notes Images taken, stored on drive Diagnosis / Impression: OD: NFP, No IRF, No SRF, partial PVD OS: retina attached with oil out - Patchy ORA; Patchy ORA, interval improvement in IRF/CME temporal macula Clinical management: See below Abbreviations: NFP - Normal foveal profile. CME - cystoid macular edema. PED - pigment epithelial detachment. IRF - intraretinal fluid. SRF - subretinal fluid. EZ - ellipsoid zone. ERM - epiretinal membrane. ORA - outer retinal atrophy. ORT - outer retinal tubulation. SRHM - subretinal hyper-reflective material     Subjective: Pt says he is agreeable to going to hospice.  He does not want to restart bipap or trelegy ventilator   Discharge Exam: Vitals:   03/09/21 2101 03/10/21 0641  BP: 119/64 135/64  Pulse: 95 62  Resp: 17 17  Temp: 98.2 F (36.8 C) 98.4 F (36.9 C)  SpO2: 100% 94%   Vitals:   03/09/21 1604 03/09/21 1751 03/09/21 2101 03/10/21 0641  BP:  121/64 119/64 135/64  Pulse:  94 95  62  Resp:   17 17  Temp: 98.1 F (36.7 C) (!) 97.4 F (36.3 C) 98.2 F (36.8 C) 98.4 F (36.9 C)  TempSrc: Oral Oral Oral Oral  SpO2:  92% 100% 94%  Weight:      Height:       General exam: frail elderly male, Appears calm and comfortable.   Respiratory system: very shallow weak breathing.  Cardiovascular system: normal S1 & S2 heard. No JVD, murmurs, rubs, gallops or clicks. No pedal edema. Gastrointestinal system: Abdomen is nondistended, soft and nontender. No organomegaly or masses felt. Normal bowel sounds heard. Central nervous system: Alert and oriented.  Extremities: trace pretibial edema.  Skin: No rashes, lesions or ulcers Psychiatry: Judgement and insight appropriate. Mood & affect appropriate.   Capacity Evaluation  Patient  does appear to have the capacity to make an informed decision to agree to or refuse treatment.      The results of significant diagnostics from this hospitalization (including imaging, microbiology, ancillary and laboratory) are listed below for reference.     Microbiology: Recent Results (from the past 240 hour(s))  Resp Panel by RT-PCR (Flu A&B, Covid) Nasopharyngeal Swab     Status: None   Collection Time: 03/08/21  7:31 AM   Specimen: Nasopharyngeal Swab; Nasopharyngeal(NP) swabs in vial transport medium  Result Value Ref Range Status   SARS Coronavirus 2 by RT PCR NEGATIVE NEGATIVE Final    Comment: (NOTE) SARS-CoV-2 target nucleic acids are NOT DETECTED.  The SARS-CoV-2 RNA is generally detectable in upper respiratory specimens during the acute phase of infection. The lowest concentration of SARS-CoV-2 viral copies this assay can detect is 138 copies/mL. A negative result does not preclude SARS-Cov-2 infection and should not be used as the sole basis for treatment or other patient management decisions. A negative result may occur with  improper specimen collection/handling, submission of specimen other than nasopharyngeal swab,  presence of viral mutation(s) within the areas targeted by this assay, and inadequate number of viral copies(<138 copies/mL). A negative result must be combined with clinical observations, patient history, and epidemiological information. The expected result is Negative.  Fact Sheet for Patients:  EntrepreneurPulse.com.au  Fact Sheet for Healthcare Providers:  IncredibleEmployment.be  This test is no t yet  approved or cleared by the [place] and  has been authorized for detection and/or diagnosis of SARS-CoV-2 by FDA under an Emergency Use Authorization (EUA). This EUA will remain  in effect (meaning this test can be used) for the duration of the COVID-19 declaration under Section 564(b)(1) of the Act, 21 U.S.C.section 360bbb-3(b)(1), unless the authorization is terminated  or revoked sooner.       Influenza A by PCR NEGATIVE NEGATIVE Final   Influenza B by PCR NEGATIVE NEGATIVE Final    Comment: (NOTE) The Xpert Xpress SARS-CoV-2/FLU/RSV plus assay is intended as an aid in the diagnosis of influenza from Nasopharyngeal swab specimens and should not be used as a sole basis for treatment. Nasal washings and aspirates are unacceptable for Xpert Xpress SARS-CoV-2/FLU/RSV testing.  Fact Sheet for Patients: EntrepreneurPulse.com.au  Fact Sheet for Healthcare Providers: IncredibleEmployment.be  This test is not yet approved or cleared by the [place] FDA and has been authorized for detection and/or diagnosis of SARS-CoV-2 by FDA under an Emergency Use Authorization (EUA). This EUA will remain in effect (meaning this test can be used) for the duration of the COVID-19 declaration under Section 564(b)(1) of the Act, 21 U.S.C. section 360bbb-3(b)(1), unless the authorization is terminated or revoked.  Performed at [facility], [address] [place], [place] [address]   Culture, blood (routine x  2)     Status: None (Preliminary result)   Collection Time: 03/08/21  7:58 AM   Specimen: BLOOD RIGHT HAND  Result Value Ref Range Status   Specimen Description   Final    BLOOD RIGHT HAND BOTTLES DRAWN AEROBIC AND ANAEROBIC   Special Requests Blood Culture adequate volume  Final   Culture   Final    NO GROWTH 2 DAYS Performed at [facility], [address] [place], [place] [address]    Report Status PENDING  Incomplete  Culture, blood (routine x 2)     Status: None (Preliminary result)   Collection Time: 03/08/21  7:58 AM   Specimen: BLOOD LEFT HAND  Result Value Ref Range Status   Specimen Description   Final    BLOOD LEFT HAND BOTTLES DRAWN AEROBIC AND ANAEROBIC   Special Requests Blood Culture adequate volume  Final   Culture   Final    NO GROWTH 2 DAYS Performed at [facility], [address], [place], [place] [address]    Report Status PENDING  Incomplete  SARS CORONAVIRUS 2 (TAT 6-24 HRS) Nasopharyngeal Nasopharyngeal Swab     Status: None   Collection Time: 03/09/21  5:56 PM   Specimen: Nasopharyngeal Swab  Result Value Ref Range Status   SARS Coronavirus 2 NEGATIVE NEGATIVE Final    Comment: (NOTE) SARS-CoV-2 target nucleic acids are NOT DETECTED.  The SARS-CoV-2 RNA is generally detectable in upper and lower respiratory specimens during the acute phase of infection. Negative results do not preclude SARS-CoV-2 infection, do not rule out co-infections with other pathogens, and should not be used as the sole basis for treatment or other patient management decisions. Negative results must be combined with clinical observations, patient history, and epidemiological information. The expected result is Negative.  Fact Sheet for Patients: SugarRoll.be  Fact Sheet for Healthcare Providers: https://www.woods-mathews.com/  This test is not yet approved or cleared by the [place] FDA and  has been authorized for detection  and/or diagnosis of SARS-CoV-2 by FDA under an Emergency Use Authorization (EUA). This EUA will remain  in effect (meaning this test can be used)  for the duration of the COVID-19 declaration under Se ction 564(b)(1) of the Act, 21 U.S.C. section 360bbb-3(b)(1), unless the authorization is terminated or revoked sooner.  Performed at [facility], [place] [address], [place], [place] [address]      Labs: BNP (last 3 results) Recent Labs    05/29/20 1640  BNP 16.3   Basic Metabolic Panel: Recent Labs  Lab 03/08/21 0454  NA 144  K 3.9  CL 102  CO2 37*  GLUCOSE 230*  BUN 16  CREATININE 0.65  CALCIUM 9.6   Liver Function Tests: Recent Labs  Lab 03/08/21 0454  AST 21  ALT 18  ALKPHOS 73  BILITOT 0.6  PROT 7.0  ALBUMIN 3.6   No results for input(s): LIPASE, AMYLASE in the last 168 hours. No results for input(s): AMMONIA in the last 168 hours. CBC: Recent Labs  Lab 03/08/21 0454  WBC 8.3  NEUTROABS 7.5  HGB 11.0*  HCT 38.8*  MCV 109.0*  PLT 166   Cardiac Enzymes: No results for input(s): CKTOTAL, CKMB, CKMBINDEX, TROPONINI in the last 168 hours. BNP: Invalid input(s): POCBNP CBG: Recent Labs  Lab 03/08/21 0515  GLUCAP 204*   D-Dimer No results for input(s): DDIMER in the last 72 hours. Hgb A1c No results for input(s): HGBA1C in the last 72 hours. Lipid Profile No results for input(s): CHOL, HDL, LDLCALC, TRIG, CHOLHDL, LDLDIRECT in the last 72 hours. Thyroid function studies No results for input(s): TSH, T4TOTAL, T3FREE, THYROIDAB in the last 72 hours.  Invalid input(s): FREET3 Anemia work up No results for input(s): VITAMINB12, FOLATE, FERRITIN, TIBC, IRON, RETICCTPCT in the last 72 hours. Urinalysis    Component Value Date/Time   COLORURINE RED (A) 06/23/2020 1551   APPEARANCEUR Hazy (A) 09/06/2020 1134   LABSPEC 1.013 06/23/2020 1551   PHURINE 7.0 06/23/2020 1551   GLUCOSEU Negative 09/06/2020 1134   HGBUR LARGE (A) 06/23/2020 1551    BILIRUBINUR Negative 09/06/2020 1134   KETONESUR NEGATIVE 06/23/2020 1551   PROTEINUR 1+ (A) 09/06/2020 1134   PROTEINUR 100 (A) 06/23/2020 1551   NITRITE Negative 09/06/2020 1134   NITRITE NEGATIVE 06/23/2020 1551   LEUKOCYTESUR 1+ (A) 09/06/2020 1134   LEUKOCYTESUR MODERATE (A) 06/23/2020 1551   Sepsis Labs Invalid input(s): PROCALCITONIN,  WBC,  LACTICIDVEN Microbiology Recent Results (from the past 240 hour(s))  Resp Panel by RT-PCR (Flu A&B, Covid) Nasopharyngeal Swab     Status: None   Collection Time: 03/08/21  7:31 AM   Specimen: Nasopharyngeal Swab; Nasopharyngeal(NP) swabs in vial transport medium  Result Value Ref Range Status   SARS Coronavirus 2 by RT PCR NEGATIVE NEGATIVE Final    Comment: (NOTE) SARS-CoV-2 target nucleic acids are NOT DETECTED.  The SARS-CoV-2 RNA is generally detectable in upper respiratory specimens during the acute phase of infection. The lowest concentration of SARS-CoV-2 viral copies this assay can detect is 138 copies/mL. A negative result does not preclude SARS-Cov-2 infection and should not be used as the sole basis for treatment or other patient management decisions. A negative result may occur with  improper specimen collection/handling, submission of specimen other than nasopharyngeal swab, presence of viral mutation(s) within the areas targeted by this assay, and inadequate number of viral copies(<138 copies/mL). A negative result must be combined with clinical observations, patient history, and epidemiological information. The expected result is Negative.  Fact Sheet for Patients:  EntrepreneurPulse.com.au  Fact Sheet for Healthcare Providers:  IncredibleEmployment.be  This test is no t yet approved or cleared by  the [employer] and  has been authorized for detection and/or diagnosis of SARS-CoV-2 by FDA under an Emergency Use Authorization (EUA). This EUA will remain  in effect (meaning  this test can be used) for the duration of the COVID-19 declaration under Section 564(b)(1) of the Act, 21 U.S.C.section 360bbb-3(b)(1), unless the authorization is terminated  or revoked sooner.       Influenza A by PCR NEGATIVE NEGATIVE Final   Influenza B by PCR NEGATIVE NEGATIVE Final    Comment: (NOTE) The Xpert Xpress SARS-CoV-2/FLU/RSV plus assay is intended as an aid in the diagnosis of influenza from Nasopharyngeal swab specimens and should not be used as a sole basis for treatment. Nasal washings and aspirates are unacceptable for Xpert Xpress SARS-CoV-2/FLU/RSV testing.  Fact Sheet for Patients: EntrepreneurPulse.com.au  Fact Sheet for Healthcare Providers: IncredibleEmployment.be  This test is not yet approved or cleared by the [place] FDA and has been authorized for detection and/or diagnosis of SARS-CoV-2 by FDA under an Emergency Use Authorization (EUA). This EUA will remain in effect (meaning this test can be used) for the duration of the COVID-19 declaration under Section 564(b)(1) of the Act, 21 U.S.C. section 360bbb-3(b)(1), unless the authorization is terminated or revoked.  Performed at [facility], [address] [place], [place] [address]   Culture, blood (routine x 2)     Status: None (Preliminary result)   Collection Time: 03/08/21  7:58 AM   Specimen: BLOOD RIGHT HAND  Result Value Ref Range Status   Specimen Description   Final    BLOOD RIGHT HAND BOTTLES DRAWN AEROBIC AND ANAEROBIC   Special Requests Blood Culture adequate volume  Final   Culture   Final    NO GROWTH 2 DAYS Performed at [facility], [address] [place], [place] [address]    Report Status PENDING  Incomplete  Culture, blood (routine x 2)     Status: None (Preliminary result)   Collection Time: 03/08/21  7:58 AM   Specimen: BLOOD LEFT HAND  Result Value Ref Range Status   Specimen Description   Final    BLOOD LEFT HAND BOTTLES  DRAWN AEROBIC AND ANAEROBIC   Special Requests Blood Culture adequate volume  Final   Culture   Final    NO GROWTH 2 DAYS Performed at [facility], [address], [place], [place] [address]    Report Status PENDING  Incomplete  SARS CORONAVIRUS 2 (TAT 6-24 HRS) Nasopharyngeal Nasopharyngeal Swab     Status: None   Collection Time: 03/09/21  5:56 PM   Specimen: Nasopharyngeal Swab  Result Value Ref Range Status   SARS Coronavirus 2 NEGATIVE NEGATIVE Final    Comment: (NOTE) SARS-CoV-2 target nucleic acids are NOT DETECTED.  The SARS-CoV-2 RNA is generally detectable in upper and lower respiratory specimens during the acute phase of infection. Negative results do not preclude SARS-CoV-2 infection, do not rule out co-infections with other pathogens, and should not be used as the sole basis for treatment or other patient management decisions. Negative results must be combined with clinical observations, patient history, and epidemiological information. The expected result is Negative.  Fact Sheet for Patients: SugarRoll.be  Fact Sheet for Healthcare Providers: https://www.woods-mathews.com/  This test is not yet approved or cleared by the [place] FDA and  has been authorized for detection and/or diagnosis of SARS-CoV-2 by FDA under an Emergency Use Authorization (EUA). This EUA will remain  in effect (meaning this test can be used) for the duration of  the COVID-19 declaration under Se ction 564(b)(1) of the Act, 21 U.S.C. section 360bbb-3(b)(1), unless the authorization is terminated or revoked sooner.  Performed at [facility], [place] [address], [place], [place] [address]     Time coordinating discharge:   SIGNED:  Irwin Brakeman, MD  [facility] 03/10/2021, 11:31 AM How to contact the [facility] Attending or Consulting provider [place] or covering provider during after hours [place], for this patient?  Check the care  team in [facility] and look for a) attending/consulting [facility] provider listed and b) the [facility] team listed Log into [email or web address] and use [place]'s universal password to access. If you do not have the password, please contact the hospital operator. Locate the [facility] provider you are looking for under [facility] and page to a number that you can be directly reached. If you still have difficulty reaching the provider, please page the [facility] (Director on Call) for the Hospitalists listed on amion for assistance.

## 2021-03-10 NOTE — TOC Transition Note (Signed)
Transition of Care [facility]) - CM/SW Discharge Note   Patient Details  Name: Jerome Mays MRN: [id number] Date of Birth: 21-Mar-1941  [facility]) CM/SW Contact:  Salome Arnt, LCSW Phone Number: 03/10/2021, 2:38 PM   Clinical Narrative:  Tawni Carnes reviewed referral and can accept pt. Pt d/c today to [facility]. Pt's wife has signed consents. COVID negative yesterday. D/C summary sent to hospice. Pt will transport via [facility]. RN given number to call report. Pt's wife updated and agreeable to plan.      Final next level of care: [place] Barriers to Discharge: Barriers Resolved   Patient Goals and CMS Choice        Discharge Placement                Patient to be transferred to facility by: [facility] Name of family member notified: wife Patient and family notified of of transfer: 03/10/21  Discharge Plan and Services                DME Arranged: N/A           HH Agency: [facility]        Social Determinants of Health (SDOH) Interventions     Readmission Risk Interventions Readmission Risk Prevention Plan 06/26/2020  Post Dischage Appt Complete  Medication Screening Complete  Transportation Screening Complete  Some recent data might be hidden

## 2021-03-11 ENCOUNTER — Other Ambulatory Visit: Payer: Self-pay | Admitting: *Deleted

## 2021-03-11 DIAGNOSIS — Z7401 Bed confinement status: Secondary | ICD-10-CM | POA: Diagnosis not present

## 2021-03-11 DIAGNOSIS — R404 Transient alteration of awareness: Secondary | ICD-10-CM | POA: Diagnosis not present

## 2021-03-12 DIAGNOSIS — G1221 Amyotrophic lateral sclerosis: Secondary | ICD-10-CM | POA: Diagnosis not present

## 2021-03-13 LAB — CULTURE, BLOOD (ROUTINE X 2)
Culture: NO GROWTH
Culture: NO GROWTH
Special Requests: ADEQUATE
Special Requests: ADEQUATE

## 2021-03-19 ENCOUNTER — Ambulatory Visit: Payer: Self-pay | Admitting: *Deleted

## 2021-03-26 ENCOUNTER — Other Ambulatory Visit: Payer: Self-pay | Admitting: Nurse Practitioner

## 2021-04-08 NOTE — Patient Outreach (Signed)
[facility]) Care Management  2021-03-19  KALEL HARTY Jun 13, 1940 [id number]   Patient discharged today from hospital to [facility] for hospice care.  Will close case at this time.  Valente David, [place], MSN [place] [phone]

## 2021-04-08 NOTE — Progress Notes (Signed)
Chaplain engaged in an initial visit with Jerome Mays, his two daughters, and wife.  Chaplain learned that Jerome Mays is very artistic, meticulous, and crafty.  He has built grandfather clocks and trains using various materials.  His family voiced that he never needed instructions but could just envision something and build it.  His wife shared that he built part of a Copywriter, advertising using salt and pepper shakers.  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  Jerome Mays's daughters and wife are very proud of him and honored him through their words by sharing his many accomplishments.  Daughter shared that Jerome Mays is awaiting transportation to hospice and that he has had a bed since 3:00 pm yesterday.  Chaplain asked family how this process has been for Jerome Mays in his healthcare journey and shared about him being diagnosed with ALS a couple of years ago but their belief that he had it much sooner.    Jerome Mays is well supported through his faith community and has had his minister visit with him.  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  Family also expressed that they are close to Jerome Mays through her father.    Chaplain offered presence, support, and listening.     04/10/2021 1000  Clinical Encounter Type  Visited With Patient and family together  Visit Type Initial;Spiritual support  Referral From Palliative care team  Consult/Referral To Chaplain

## 2021-04-08 DEATH — deceased

## 2021-04-23 ENCOUNTER — Encounter (INDEPENDENT_AMBULATORY_CARE_PROVIDER_SITE_OTHER): Payer: Medicare HMO | Admitting: Ophthalmology

## 2021-06-10 ENCOUNTER — Inpatient Hospital Stay (HOSPITAL_COMMUNITY): Payer: Medicare HMO

## 2021-06-17 ENCOUNTER — Ambulatory Visit (HOSPITAL_COMMUNITY): Payer: Medicare HMO | Admitting: Hematology
